# Patient Record
Sex: Female | Born: 1940 | Race: White | Hispanic: No | Marital: Married | Smoking: Never smoker
Health system: Southern US, Community
[De-identification: ages and names within clinical notes are randomized; demographics above are authoritative.]

## PROBLEM LIST (undated history)

## (undated) DIAGNOSIS — I4891 Unspecified atrial fibrillation: Secondary | ICD-10-CM

## (undated) DIAGNOSIS — Z923 Personal history of irradiation: Secondary | ICD-10-CM

## (undated) DIAGNOSIS — M1990 Unspecified osteoarthritis, unspecified site: Secondary | ICD-10-CM

## (undated) DIAGNOSIS — I341 Nonrheumatic mitral (valve) prolapse: Secondary | ICD-10-CM

## (undated) DIAGNOSIS — K5730 Diverticulosis of large intestine without perforation or abscess without bleeding: Secondary | ICD-10-CM

## (undated) DIAGNOSIS — C541 Malignant neoplasm of endometrium: Secondary | ICD-10-CM

## (undated) DIAGNOSIS — H269 Unspecified cataract: Secondary | ICD-10-CM

## (undated) DIAGNOSIS — I340 Nonrheumatic mitral (valve) insufficiency: Secondary | ICD-10-CM

## (undated) DIAGNOSIS — D649 Anemia, unspecified: Secondary | ICD-10-CM

## (undated) DIAGNOSIS — I071 Rheumatic tricuspid insufficiency: Secondary | ICD-10-CM

## (undated) DIAGNOSIS — I10 Essential (primary) hypertension: Secondary | ICD-10-CM

## (undated) DIAGNOSIS — K219 Gastro-esophageal reflux disease without esophagitis: Secondary | ICD-10-CM

## (undated) HISTORY — DX: Essential (primary) hypertension: I10

## (undated) HISTORY — PX: OTHER SURGICAL HISTORY: SHX169

## (undated) HISTORY — DX: Malignant neoplasm of endometrium: C54.1

## (undated) HISTORY — PX: TEAR DUCT PROBING: SHX793

## (undated) HISTORY — DX: Unspecified osteoarthritis, unspecified site: M19.90

## (undated) HISTORY — DX: Unspecified cataract: H26.9

## (undated) HISTORY — PX: TOOTH EXTRACTION: SUR596

## (undated) HISTORY — DX: Anemia, unspecified: D64.9

## (undated) HISTORY — DX: Personal history of irradiation: Z92.3

## (undated) HISTORY — DX: Nonrheumatic mitral (valve) insufficiency: I34.0

## (undated) HISTORY — PX: ORTHOPEDIC SURGERY: SHX850

## (undated) HISTORY — PX: APPENDECTOMY: SHX54

## (undated) HISTORY — PX: LAPAROSCOPIC HYSTERECTOMY: SHX1926

---

## 1965-11-01 HISTORY — PX: ECTOPIC PREGNANCY SURGERY: SHX613

## 1998-04-11 ENCOUNTER — Other Ambulatory Visit: Admission: RE | Admit: 1998-04-11 | Discharge: 1998-04-11 | Payer: Self-pay | Admitting: Obstetrics & Gynecology

## 1998-11-04 ENCOUNTER — Other Ambulatory Visit: Admission: RE | Admit: 1998-11-04 | Discharge: 1998-11-04 | Payer: Self-pay | Admitting: Obstetrics & Gynecology

## 1999-05-12 ENCOUNTER — Other Ambulatory Visit: Admission: RE | Admit: 1999-05-12 | Discharge: 1999-05-12 | Payer: Self-pay | Admitting: Obstetrics & Gynecology

## 2000-06-20 ENCOUNTER — Other Ambulatory Visit: Admission: RE | Admit: 2000-06-20 | Discharge: 2000-06-20 | Payer: Self-pay | Admitting: Obstetrics & Gynecology

## 2001-07-12 ENCOUNTER — Other Ambulatory Visit: Admission: RE | Admit: 2001-07-12 | Discharge: 2001-07-12 | Payer: Self-pay | Admitting: Obstetrics & Gynecology

## 2002-08-22 ENCOUNTER — Other Ambulatory Visit: Admission: RE | Admit: 2002-08-22 | Discharge: 2002-08-22 | Payer: Self-pay | Admitting: Obstetrics & Gynecology

## 2003-08-30 ENCOUNTER — Other Ambulatory Visit: Admission: RE | Admit: 2003-08-30 | Discharge: 2003-08-30 | Payer: Self-pay | Admitting: Obstetrics & Gynecology

## 2004-06-30 ENCOUNTER — Encounter: Payer: Self-pay | Admitting: Internal Medicine

## 2004-09-22 ENCOUNTER — Other Ambulatory Visit: Admission: RE | Admit: 2004-09-22 | Discharge: 2004-09-22 | Payer: Self-pay | Admitting: Obstetrics & Gynecology

## 2005-10-19 ENCOUNTER — Other Ambulatory Visit: Admission: RE | Admit: 2005-10-19 | Discharge: 2005-10-19 | Payer: Self-pay | Admitting: Obstetrics & Gynecology

## 2007-01-27 ENCOUNTER — Ambulatory Visit (HOSPITAL_COMMUNITY): Admission: RE | Admit: 2007-01-27 | Discharge: 2007-01-27 | Payer: Self-pay | Admitting: Obstetrics & Gynecology

## 2007-01-27 ENCOUNTER — Encounter (INDEPENDENT_AMBULATORY_CARE_PROVIDER_SITE_OTHER): Payer: Self-pay | Admitting: *Deleted

## 2008-01-30 ENCOUNTER — Ambulatory Visit: Admission: RE | Admit: 2008-01-30 | Discharge: 2008-01-30 | Payer: Self-pay | Admitting: Gynecologic Oncology

## 2008-02-27 ENCOUNTER — Encounter: Payer: Self-pay | Admitting: Gynecologic Oncology

## 2008-02-27 ENCOUNTER — Ambulatory Visit (HOSPITAL_COMMUNITY): Admission: RE | Admit: 2008-02-27 | Discharge: 2008-02-28 | Payer: Self-pay | Admitting: Family Medicine

## 2008-03-05 ENCOUNTER — Ambulatory Visit: Admission: RE | Admit: 2008-03-05 | Discharge: 2008-03-05 | Payer: Self-pay | Admitting: Gynecologic Oncology

## 2008-04-02 ENCOUNTER — Ambulatory Visit: Admission: RE | Admit: 2008-04-02 | Discharge: 2008-07-01 | Payer: Self-pay | Admitting: Radiation Oncology

## 2008-04-16 ENCOUNTER — Ambulatory Visit: Admission: RE | Admit: 2008-04-16 | Discharge: 2008-04-16 | Payer: Self-pay | Admitting: Gynecologic Oncology

## 2008-04-23 LAB — CBC WITH DIFFERENTIAL/PLATELET
BASO%: 0.8 % (ref 0.0–2.0)
Basophils Absolute: 0.1 10*3/uL (ref 0.0–0.1)
EOS%: 2.5 % (ref 0.0–7.0)
Eosinophils Absolute: 0.2 10*3/uL (ref 0.0–0.5)
HCT: 40 % (ref 34.8–46.6)
HGB: 13.9 g/dL (ref 11.6–15.9)
LYMPH%: 28 % (ref 14.0–48.0)
MCH: 31 pg (ref 26.0–34.0)
MCHC: 34.8 g/dL (ref 32.0–36.0)
MCV: 89.2 fL (ref 81.0–101.0)
MONO#: 0.9 10*3/uL (ref 0.1–0.9)
MONO%: 13.5 % — ABNORMAL HIGH (ref 0.0–13.0)
NEUT#: 3.8 10*3/uL (ref 1.5–6.5)
NEUT%: 55.2 % (ref 39.6–76.8)
Platelets: 188 10*3/uL (ref 145–400)
RBC: 4.49 10*6/uL (ref 3.70–5.32)
RDW: 12.5 % (ref 11.3–14.5)
WBC: 6.8 10*3/uL (ref 3.9–10.0)
lymph#: 1.9 10*3/uL (ref 0.9–3.3)

## 2008-04-30 LAB — CBC WITH DIFFERENTIAL/PLATELET
BASO%: 0.9 % (ref 0.0–2.0)
Basophils Absolute: 0 10*3/uL (ref 0.0–0.1)
EOS%: 2.4 % (ref 0.0–7.0)
Eosinophils Absolute: 0.1 10*3/uL (ref 0.0–0.5)
HCT: 38.9 % (ref 34.8–46.6)
HGB: 13.4 g/dL (ref 11.6–15.9)
LYMPH%: 23.1 % (ref 14.0–48.0)
MCH: 30.9 pg (ref 26.0–34.0)
MCHC: 34.5 g/dL (ref 32.0–36.0)
MCV: 89.5 fL (ref 81.0–101.0)
MONO#: 0.6 10*3/uL (ref 0.1–0.9)
MONO%: 14.2 % — ABNORMAL HIGH (ref 0.0–13.0)
NEUT#: 2.7 10*3/uL (ref 1.5–6.5)
NEUT%: 59.5 % (ref 39.6–76.8)
Platelets: 132 10*3/uL — ABNORMAL LOW (ref 145–400)
RBC: 4.35 10*6/uL (ref 3.70–5.32)
RDW: 12.4 % (ref 11.3–14.5)
WBC: 4.5 10*3/uL (ref 3.9–10.0)
lymph#: 1 10*3/uL (ref 0.9–3.3)

## 2008-05-07 LAB — CBC WITH DIFFERENTIAL/PLATELET
BASO%: 1.4 % (ref 0.0–2.0)
Basophils Absolute: 0.1 10*3/uL (ref 0.0–0.1)
EOS%: 4.2 % (ref 0.0–7.0)
Eosinophils Absolute: 0.2 10*3/uL (ref 0.0–0.5)
HCT: 36.6 % (ref 34.8–46.6)
HGB: 12.7 g/dL (ref 11.6–15.9)
LYMPH%: 19.7 % (ref 14.0–48.0)
MCH: 30.9 pg (ref 26.0–34.0)
MCHC: 34.8 g/dL (ref 32.0–36.0)
MCV: 88.9 fL (ref 81.0–101.0)
MONO#: 0.6 10*3/uL (ref 0.1–0.9)
MONO%: 14.8 % — ABNORMAL HIGH (ref 0.0–13.0)
NEUT#: 2.4 10*3/uL (ref 1.5–6.5)
NEUT%: 59.9 % (ref 39.6–76.8)
Platelets: 128 10*3/uL — ABNORMAL LOW (ref 145–400)
RBC: 4.12 10*6/uL (ref 3.70–5.32)
RDW: 12.5 % (ref 11.3–14.5)
WBC: 4 10*3/uL (ref 3.9–10.0)
lymph#: 0.8 10*3/uL — ABNORMAL LOW (ref 0.9–3.3)

## 2008-05-28 LAB — CBC WITH DIFFERENTIAL/PLATELET
BASO%: 0.6 % (ref 0.0–2.0)
Basophils Absolute: 0 10*3/uL (ref 0.0–0.1)
EOS%: 5.6 % (ref 0.0–7.0)
Eosinophils Absolute: 0.3 10*3/uL (ref 0.0–0.5)
HCT: 36.6 % (ref 34.8–46.6)
HGB: 12.8 g/dL (ref 11.6–15.9)
LYMPH%: 8.7 % — ABNORMAL LOW (ref 14.0–48.0)
MCH: 31.2 pg (ref 26.0–34.0)
MCHC: 35 g/dL (ref 32.0–36.0)
MCV: 89.3 fL (ref 81.0–101.0)
MONO#: 0.8 10*3/uL (ref 0.1–0.9)
MONO%: 15.5 % — ABNORMAL HIGH (ref 0.0–13.0)
NEUT#: 3.5 10*3/uL (ref 1.5–6.5)
NEUT%: 69.6 % (ref 39.6–76.8)
Platelets: 164 10*3/uL (ref 145–400)
RBC: 4.1 10*6/uL (ref 3.70–5.32)
RDW: 13 % (ref 11.3–14.5)
WBC: 5 10*3/uL (ref 3.9–10.0)
lymph#: 0.4 10*3/uL — ABNORMAL LOW (ref 0.9–3.3)

## 2008-08-01 ENCOUNTER — Ambulatory Visit: Admission: RE | Admit: 2008-08-01 | Discharge: 2008-08-01 | Payer: Self-pay | Admitting: Radiation Oncology

## 2008-09-24 ENCOUNTER — Other Ambulatory Visit: Admission: RE | Admit: 2008-09-24 | Discharge: 2008-09-24 | Payer: Self-pay | Admitting: Gynecologic Oncology

## 2008-09-24 ENCOUNTER — Ambulatory Visit: Admission: RE | Admit: 2008-09-24 | Discharge: 2008-09-24 | Payer: Self-pay | Admitting: Gynecologic Oncology

## 2008-09-25 ENCOUNTER — Encounter: Payer: Self-pay | Admitting: Gynecologic Oncology

## 2008-12-23 ENCOUNTER — Other Ambulatory Visit: Admission: RE | Admit: 2008-12-23 | Discharge: 2008-12-23 | Payer: Self-pay | Admitting: Radiation Oncology

## 2008-12-23 ENCOUNTER — Ambulatory Visit: Admission: RE | Admit: 2008-12-23 | Discharge: 2008-12-23 | Payer: Self-pay | Admitting: Radiation Oncology

## 2009-01-24 DIAGNOSIS — K5730 Diverticulosis of large intestine without perforation or abscess without bleeding: Secondary | ICD-10-CM | POA: Insufficient documentation

## 2009-01-24 DIAGNOSIS — K625 Hemorrhage of anus and rectum: Secondary | ICD-10-CM | POA: Insufficient documentation

## 2009-01-24 DIAGNOSIS — R197 Diarrhea, unspecified: Secondary | ICD-10-CM | POA: Insufficient documentation

## 2009-01-24 DIAGNOSIS — Z8719 Personal history of other diseases of the digestive system: Secondary | ICD-10-CM | POA: Insufficient documentation

## 2009-01-24 DIAGNOSIS — I10 Essential (primary) hypertension: Secondary | ICD-10-CM | POA: Insufficient documentation

## 2009-01-24 DIAGNOSIS — C549 Malignant neoplasm of corpus uteri, unspecified: Secondary | ICD-10-CM | POA: Insufficient documentation

## 2009-01-24 HISTORY — DX: Essential (primary) hypertension: I10

## 2009-01-28 ENCOUNTER — Ambulatory Visit: Payer: Self-pay | Admitting: Internal Medicine

## 2009-02-05 ENCOUNTER — Ambulatory Visit (HOSPITAL_COMMUNITY): Admission: RE | Admit: 2009-02-05 | Discharge: 2009-02-05 | Payer: Self-pay | Admitting: Internal Medicine

## 2009-02-05 ENCOUNTER — Ambulatory Visit: Payer: Self-pay | Admitting: Internal Medicine

## 2009-02-05 ENCOUNTER — Encounter: Payer: Self-pay | Admitting: Internal Medicine

## 2009-02-06 ENCOUNTER — Encounter: Payer: Self-pay | Admitting: Internal Medicine

## 2009-03-19 ENCOUNTER — Other Ambulatory Visit: Admission: RE | Admit: 2009-03-19 | Discharge: 2009-03-19 | Payer: Self-pay | Admitting: Gynecologic Oncology

## 2009-03-19 ENCOUNTER — Encounter: Payer: Self-pay | Admitting: Gynecologic Oncology

## 2009-03-19 ENCOUNTER — Ambulatory Visit: Admission: RE | Admit: 2009-03-19 | Discharge: 2009-03-19 | Payer: Self-pay | Admitting: Gynecologic Oncology

## 2009-04-11 ENCOUNTER — Encounter: Admission: RE | Admit: 2009-04-11 | Discharge: 2009-04-11 | Payer: Self-pay | Admitting: Internal Medicine

## 2009-04-11 ENCOUNTER — Encounter (INDEPENDENT_AMBULATORY_CARE_PROVIDER_SITE_OTHER): Payer: Self-pay | Admitting: *Deleted

## 2009-05-13 ENCOUNTER — Ambulatory Visit: Payer: Self-pay | Admitting: Internal Medicine

## 2009-06-23 ENCOUNTER — Ambulatory Visit: Admission: RE | Admit: 2009-06-23 | Discharge: 2009-06-23 | Payer: Self-pay | Admitting: Radiation Oncology

## 2009-06-23 ENCOUNTER — Encounter: Payer: Self-pay | Admitting: Radiation Oncology

## 2009-06-23 ENCOUNTER — Encounter: Payer: Self-pay | Admitting: Internal Medicine

## 2009-06-23 ENCOUNTER — Other Ambulatory Visit: Admission: RE | Admit: 2009-06-23 | Discharge: 2009-06-23 | Payer: Self-pay | Admitting: Radiation Oncology

## 2009-09-17 ENCOUNTER — Other Ambulatory Visit: Admission: RE | Admit: 2009-09-17 | Discharge: 2009-09-17 | Payer: Self-pay | Admitting: Gynecologic Oncology

## 2009-09-17 ENCOUNTER — Ambulatory Visit: Admission: RE | Admit: 2009-09-17 | Discharge: 2009-09-17 | Payer: Self-pay | Admitting: Gynecologic Oncology

## 2009-12-22 ENCOUNTER — Ambulatory Visit: Admission: RE | Admit: 2009-12-22 | Discharge: 2009-12-22 | Payer: Self-pay | Admitting: Radiation Oncology

## 2009-12-22 ENCOUNTER — Other Ambulatory Visit: Admission: RE | Admit: 2009-12-22 | Discharge: 2009-12-22 | Payer: Self-pay | Admitting: Gynecologic Oncology

## 2010-03-11 ENCOUNTER — Other Ambulatory Visit: Admission: RE | Admit: 2010-03-11 | Discharge: 2010-03-11 | Payer: Self-pay | Admitting: Gynecologic Oncology

## 2010-03-11 ENCOUNTER — Ambulatory Visit: Admission: RE | Admit: 2010-03-11 | Discharge: 2010-03-11 | Payer: Self-pay | Admitting: Gynecologic Oncology

## 2010-09-17 ENCOUNTER — Other Ambulatory Visit: Admission: RE | Admit: 2010-09-17 | Discharge: 2010-09-17 | Payer: Self-pay | Admitting: Radiation Oncology

## 2010-09-17 ENCOUNTER — Ambulatory Visit: Admission: RE | Admit: 2010-09-17 | Discharge: 2010-09-17 | Payer: Self-pay | Admitting: Radiation Oncology

## 2010-12-03 NOTE — Letter (Addendum)
Summary: [facility]  [facility]   Imported By: Sherian Rein 07/17/2009 15:15:06  _____________________________________________________________________  External Attachments:     1. Type:   Image          Comment:   External Document    2. Type:   Image          Comment:   External Document

## 2010-12-03 NOTE — Procedures (Signed)
Summary: Instructions for Colonoscopy/[facility] (out pt)  Instructions for Colonoscopy/[facility] (out pt)   Imported By: Sherian Rein 01/29/2009 14:18:51  _____________________________________________________________________  External Attachment:    Type:   Image     Comment:   External Document

## 2010-12-03 NOTE — Procedures (Signed)
Summary: Colonoscopy/BX.   Colonoscopy  Procedure date:  02/05/2009  Findings:      Location:  [facility].    COLONOSCOPY PROCEDURE REPORT  PATIENT:  [name], [name]  MR#:  [id number] BIRTHDATE:   01-11-1941, 67 yrs. old   GENDER:   female  ENDOSCOPIST:   Hedwig Morton. Juanda Chance, MD Referred by: Varney Baas, M.D.  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  Digital rectal exam was performed and revealed no rectal masses.   The EC-[id number] ([id number]), VF-[id number] ([id number]) and [id number] ([id number]) endoscope was introduced through the anus and advanced to the mid transverse colon, limited by a stricture.  narrow sigmoid colon lumen starting at 20 cm, large edematous folds, lumen slightly narrow, few scattered diverticuli, standard colonoscope unable to pass  pediatric upper endoscope used to traverse the the sigmoid stricture,   The quality of the prep was good, using MiraLax.  The instrument was then slowly withdrawn as the colon was fully examined. 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 narrow lumen 20-30 cm, due to large folds, tortuous rigid lumen, musoca slightly edematous, but no bleeding, pediatric upper endoscope used to traverse the sigmoid colon With standard forceps, biopsy was obtained and sent to pathology (see image001, image002, and image003).   Retroflexed views in the rectum revealed no abnormalities.    The scope was then withdrawn from the patient and the procedure completed.  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  would do Barium enema or virtual colonoscopy next time   _______________________________ Hedwig Morton. Juanda Chance, MD  CC: Juline Patch MD, Dr Duard Brady   This report was created from the original endoscopy report, which was reviewed and signed by the above listed endoscopist.     Appended Document: Colonoscopy/BX. Recall is in IDX for 01/2014.

## 2010-12-03 NOTE — Procedures (Signed)
Summary: COLON   Colonoscopy  Procedure date:  06/30/2004  Findings:      Location:  [facility].    Procedures Next Due Date:    Colonoscopy: 07/2014  Patient Name: [name], [name]. MRN:  Procedure Procedures: Colonoscopy CPT: [phone].  Personnel: Endoscopist: Dora L. Juanda Chance, MD.  Exam Location: Exam performed in Outpatient Clinic. Outpatient  Patient Consent: Procedure, Alternatives, Risks and Benefits discussed, consent obtained, from patient. Consent was obtained by the RN.  Indications  Surveillance of: 1994.  Average Risk Screening Routine.  History  Current Medications: Patient is not currently taking Coumadin.  Pre-Exam Physical: Performed Jun 30, 2004. Entire physical exam was normal.  Exam Exam: Extent of exam reached: Cecum, extent intended: Cecum.  The cecum was identified by appendiceal orifice and IC valve. Colon retroflexion performed. Images taken. ASA Classification: I. Tolerance: good.  Monitoring: Pulse and BP monitoring, Oximetry used. Supplemental O2 given.  Colon Prep Used Miralax for colon prep. Prep results: good.  Sedation Meds: Patient assessed and found to be appropriate for moderate (conscious) sedation. Fentanyl 125 mcg. given IV. Versed 13 given IV.  Findings NORMAL EXAM: Cecum.  - DIVERTICULOSIS: Sigmoid Colon. ICD9: Diverticulosis: 562.10. Comments: mild, shallow diverticuli.   Assessment Normal examination.  Diagnoses: 562.10: Diverticulosis.   Comments: no polyps Events  Unplanned Interventions: No intervention was required.  Unplanned Events: There were no complications. Plans Patient Education: Patient given standard instructions for: Yearly hemoccult testing recommended. Patient instructed to get routine colonoscopy every 10 years.  Disposition: After procedure patient sent to recovery. After recovery patient sent home.   This report was created from the original endoscopy report, which was  reviewed and signed by the above listed endoscopist.

## 2010-12-03 NOTE — Letter (Signed)
Summary: Patient Notice- Colon Biospy Results  [facility]  [address] [place], [place] [address]   Phone: [phone]  Fax: [phone]        February 06, 2009 MRN: [id number]    SARAJANE FAMBROUGH [address] [place], [place]  [address]    Dear Ms. Yetta Barre,  I am pleased to inform you that the biopsies taken during your recent colonoscopy did not show any evidence of cancer upon pathologic examination.  Additional information/recommendations:  __No further action is needed at this time.  Please follow-up with      your primary care physician for your other healthcare needs.  __Please call [phone] to schedule a return visit to review      your condition.  _x_Continue with the treatment plan as outlined on the day of your      exam.  _x_You should have a repeat colonoscopy examination for this problem           in _5 years.  Please call us if you are having persistent problems or have questions about your condition that have not been fully answered at this time.  Sincerely,  Hart Carwin   This letter has been electronically signed by your physician.  Appended Document: Patient Notice- Colon Biospy Results Letter mailed to patient. Recall is in IDX for 01/2014.

## 2010-12-03 NOTE — Assessment & Plan Note (Signed)
Summary: RECTAL BLEED/YF   History of Present Illness Visit Type: Initial Consult Primary GI MD: Lina Sar MD Primary Provider: Juline Patch MD Requesting Provider: Varney Baas MD Chief Complaint: rectal bleeding, diarrhea History of Present Illness:   This is a 70 year old white female with intermittent painless rectal bleeding. She is status post pelvic radiation for uterine cancer in the summer of 2009. She also had a TAH/BSO in April of 2009. She is having diarrhea, urgency and some leakage of stool. Her last colonoscopy in August 2005 showed mild diverticulosis of the left colon. Patient has no history of colon cancer, polyps or any family history of colon neoplasm. Her first colonoscopy was in 1994 and was normal. She has used Imodium for the diarrhea but it has had a constipating effect.   GI Review of Systems      Denies abdominal pain, acid reflux, belching, bloating, chest pain, dysphagia with liquids, dysphagia with solids, heartburn, loss of appetite, nausea, vomiting, vomiting blood, weight loss, and  weight gain.      Reports diarrhea.     Denies anal fissure, black tarry stools, change in bowel habit, constipation, diverticulosis, fecal incontinence, heme positive stool, hemorrhoids, irritable bowel syndrome, jaundice, light color stool, liver problems, and  rectal pain.    Current Medications (verified): 1)  Toprol Xl 25 Mg Xr24h-Tab (Metoprolol Succinate) .... Take 1 Tablet By Mouth Two Times A Day 2)  Celebrex 200 Mg Caps (Celecoxib) .... As Needed 3)  Plaquenil 200 Mg Tabs (Hydroxychloroquine Sulfate) .Marland Kitchen.. 1 By Mouth Once Daily in The Spring  Allergies (verified): No Known Drug Allergies  Past History:  Past Medical History:    Reviewed history from 01/24/2009 and no changes required:    Current Problems:     HYPERTENSION (ICD-401.9)    Hx of CANCER, ENDOMETRIUM (ICD-182.0)    DIARRHEA (ICD-787.91)    HEMORRHOIDS, HX OF (ICD-V12.79)    DIVERTICULOSIS OF  COLON (ICD-562.10)    RECTAL BLEEDING (ICD-569.3)       Past Surgical History:    Reviewed history from 01/24/2009 and no changes required:    Total Hysterectomy  Family History:    Reviewed history from 01/24/2009 and no changes required:       Family History of Breast Cancer:       No FH of Colon Cancer:  Social History:    Alcohol Use - yes-1 drink per week    Illicit Drug Use - no    Patient has never smoked.     Patient does not get regular exercise.     Smoking Status:  never    Does Patient Exercise:  no  Review of Systems       The patient complains of night sweats, swelling of feet/legs, and urine leakage.    Vital Signs:  Patient profile:   70 year old female Height:      64 inches Weight:      201.13 pounds BMI:     34.65 Pulse rate:   72 / minute Pulse rhythm:   regular BP sitting:   118 / 60  (right arm)  Vitals Entered By: Chales Abrahams CMA (January 28, 2009 3:07 PM)  Physical Exam  General:  alert, oriented and in no distress. Neck:  Supple; no masses or thyromegaly. Lungs:  Clear throughout to auscultation. Heart:  Regular rate and rhythm; no murmurs, rubs or bruits. Abdomen:  soft mildly obese abdomen with normal active bowel sounds. No distention. Liver edge at costal margin.  Decreased pubic hair due to radiation of the sacral area which also reveals radiation skin changes. Rectal:  normal perianal area. Normal rectal tone. No external hemorrhoids. Stool is soft and hemoccult-negative. Extremities:  No clubbing, cyanosis, edema or deformities noted. Neurologic:  Alert and  oriented x4;  grossly normal neurologically.   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 We need to rule out new onset inflammatory bowel disease, collagenous colitis or irritable bowel syndrome. A colonoscopy was done 5 years ago prior to the radiation. Because of her increased risk  of colon cancer, we will proceed with a colonoscopy and appropriate biopsies. We will treat her empirically with ProctoFoam and Bentyl 20 mg twice a day.  Problem # 2:  Hx of CANCER, ENDOMETRIUM (ICD-182.0) status post pelvic radiation followed by Dr. Lloyd Huger and by Dr.Gehrig.  Other Orders: ZCOL (ZCOL)  Patient Instructions: 1)  ProctoFoam apply q.h.s. 2)  Bentyl 20 mg p.o. b.i.d. 3)  Schedule colonoscopy 4)  Copy sent to : Dr Duard Brady, Dr R.Jennette Kettle Prescriptions: DULCOLAX 5 MG  TBEC (BISACODYL) Day before procedure take 2 at 3pm and 2 at 8pm.  #4 x 0   Entered by:   Hortense Ramal CMA   Authorized by:   Hart Carwin   Signed by:   Hortense Ramal CMA on 01/28/2009   Method used:   Electronically to        [facility] (retail)       [address]       [place], [place]  [address]       Ph: [phone]       Fax: [phone]   RxID:   [id number] REGLAN 10 MG  TABS (METOCLOPRAMIDE HCL) As per prep instructions.  #2 x 0   Entered by:   Hortense Ramal CMA   Authorized by:   Hart Carwin   Signed by:   Hortense Ramal CMA on 01/28/2009   Method used:   Electronically to        [facility] (retail)       [address]       [place], [place]  [address]       Ph: [phone]       Fax: [phone]   RxID:   [id number] MIRALAX   POWD (POLYETHYLENE GLYCOL 3350) As per prep  instructions.  #255gm x 0   Entered by:   Hortense Ramal CMA   Authorized by:   Hart Carwin   Signed by:   Hortense Ramal CMA on 01/28/2009   Method used:   Electronically to        [facility] (retail)       [address]       [place], [place]  [address]       Ph: [phone]       Fax: [phone]   RxID:   [id number] BENTYL 20 MG TABS (DICYCLOMINE HCL) Take 1 tablet by mouth two times a day.  #60 x 2   Entered by:   Hortense Ramal CMA   Authorized by:   Hart Carwin    Signed by:   Hortense Ramal CMA on 01/28/2009   Method used:   Electronically to        [facility] (retail)       [address]       [place], [place]  [address]       Ph: [phone]       Fax: [phone]   RxID:   [id number] PROCTOFOAM HC 1-1 % FOAM (HYDROCORTISONE ACE-PRAMOXINE) Apply at bedtime  #1 kit x 1   Entered by:   Hortense Ramal CMA   Authorized by:   Hart Carwin   Signed by:   Hortense Ramal CMA on 01/28/2009   Method used:   Electronically to        [facility] (retail)       [address]       [place], [place]  [address]       Ph: [phone]       Fax: [phone]   RxID:   [id number]

## 2010-12-03 NOTE — Assessment & Plan Note (Signed)
Summary: F/U AFTER PROCEDURE 4-7.Marland KitchenMarland KitchenEM   History of Present Illness Visit Type: follow up  Primary GI MD: Lina Sar MD Primary Verlaine Embry: Juline Patch MD Requesting Ozelle Brubacher: n/a Chief Complaint: F/u after colonoscopy done in April. Pt states that she has some diarrhea but getting better History of Present Illness:   This is a 70 year old white female who we saw recently for intermittent painless rectal bleeding. She is status post pelvic radiation for uterine cancer in the summer of 2009. She also had a TAH/BSO in April of 2009. At the time we saw her, she was having diarrhea, urgency and some leakage of stool. Her last colonoscopy in August 2005 showed mild diverticulosis of the left colon. Patient has no history of colon cancer, polyps or any family history of colon neoplasm. Her first colonoscopy was in 1994 and was normal. She was using Imodium for the diarrhea but it had a constipating effect. Subsequent to patient's last office visit, we performed a colonoscopy which was incomplete to transverse colon due to  stenosis in the sigmoid colon as wellas adhesions and radiation. Rectal biosies showed benign colorectal mucosa with no significant inflammation, granulomata or adenomatous  epithelium identified. Patient also had an abdominal ultrasound for her abdominal pain. That came back negative. Patient comes today for follow up. She does still complain of some diarrhea although she tells me it has gotten somewhat better taking Bentyl 20 mg by mouth two times a day.   GI Review of Systems      Denies abdominal pain, acid reflux, belching, bloating, chest pain, dysphagia with liquids, dysphagia with solids, heartburn, loss of appetite, nausea, vomiting, vomiting blood, weight loss, and  weight gain.      Reports diarrhea.     Denies anal fissure, black tarry stools, change in bowel habit, constipation, diverticulosis, fecal incontinence, heme positive stool, hemorrhoids, irritable bowel syndrome,  jaundice, light color stool, liver problems, rectal bleeding, and  rectal pain.    Current Medications (verified): 1)  Toprol Xl 25 Mg Xr24h-Tab (Metoprolol Succinate) .... Take 1 Tablet By Mouth Two Times A Day 2)  Celebrex 200 Mg Caps (Celecoxib) .... As Needed 3)  Plaquenil 200 Mg Tabs (Hydroxychloroquine Sulfate) .Marland Kitchen.. 1 By Mouth Once Daily in The Spring 4)  Proctofoam Hc 1-1 % Foam (Hydrocortisone Ace-Pramoxine) .... Apply At Bedtime 5)  Bentyl 20 Mg Tabs (Dicyclomine Hcl) .... Take 1 Tablet By Mouth Two Times A Day.  Allergies (verified): No Known Drug Allergies  Past History:  Past Medical History: Reviewed history from 01/24/2009 and no changes required. Current Problems:  HYPERTENSION (ICD-401.9) Hx of CANCER, ENDOMETRIUM (ICD-182.0) DIARRHEA (ICD-787.91) HEMORRHOIDS, HX OF (ICD-V12.79) DIVERTICULOSIS OF COLON (ICD-562.10) RECTAL BLEEDING (ICD-569.3)    Past Surgical History: Reviewed history from 01/24/2009 and no changes required. Total Hysterectomy  Family History: Family History of Breast Cancer:Mother and sister  No FH of Colon Cancer:  Social History: Reviewed history from 01/28/2009 and no changes required. Alcohol Use - yes-1 drink per week Illicit Drug Use - no Patient has never smoked.  Patient does not get regular exercise.   Review of Systems  The patient denies allergy/sinus, anemia, anxiety-new, arthritis/joint pain, back pain, blood in urine, breast changes/lumps, change in vision, confusion, cough, coughing up blood, depression-new, fainting, fatigue, fever, headaches-new, hearing problems, heart murmur, heart rhythm changes, itching, menstrual pain, muscle pains/cramps, night sweats, nosebleeds, pregnancy symptoms, shortness of breath, skin rash, sleeping problems, sore throat, swelling of feet/legs, swollen lymph glands, thirst - excessive , urination -  excessive , urination changes/pain, urine leakage, vision changes, and voice change.          Pertinent positive and negative review of systems were noted in the above HPI. All other ROS was otherwise negative.   Vital Signs:  Patient profile:   70 year old female Height:      64 inches Weight:      198 pounds BMI:     34.11 BSA:     1.95 Pulse rate:   74 / minute Pulse rhythm:   regular BP sitting:   116 / 72  (right arm) Cuff size:   regular  Vitals Entered By: Ok Anis CMA (May 13, 2009 8:31 AM)  Physical Exam  General:  Well developed, well nourished, no acute distress. Eyes:  PERRLA, no icterus. Neck:  Supple; no masses or thyromegaly. Lungs:  Clear throughout to auscultation. Heart:  Regular rate and rhythm; no murmurs, rubs,  or bruits. Abdomen:  soft nontender with normal active bowel sounds. No tenderness. Radiation tattoos in sacroiliac areas.   Impression & Recommendations:  Problem # 1:  DIARRHEA (ICD-787.91) intermittent urgent diarrhea with incontinence occurring twice a week. It has improved on antispasmodics. Her symptoms as likely due to pelvic adhesions and radiation. We will switch her from Bentyl to Robinul Forte 2 mg twice a day. It has been one year since her radiation and I anticipate that her symptoms may improve with time. I encouraged her to take Imodium p.r.n. especially on the days that she has to leave the house. If the symptoms continue, I would consider doing a barium enema for further evaluation.  Problem # 2:  Hx of CANCER, ENDOMETRIUM (ICD-182.0) status post 28 external  pelvic radiations and 3 internal radiations completed one year ago. She has been tumor free.  Problem # 3:  HEMORRHOIDS, HX OF (ICD-V12.79) Patient has an occasional bleeding from the hemorrhoids correlating with the diarrhea. She has topical steroids to use p.r.n.  Patient Instructions: 1)  Robinul Forte 2 mg one p.o. b.i.d. 2)  High-fiber diet 3)  Imodium p.r.n.  4)  Office visit 6 months 5)  Copy sent to : Dr R.Pang Prescriptions: ROBINUL-FORTE 2 MG TABS  (GLYCOPYRROLATE) Take 1 tablet by mouth two times a day  #60 x 11   Entered by:   Hortense Ramal CMA (AAMA)   Authorized by:   Hart Carwin MD   Signed by:   Hortense Ramal CMA (AAMA) on 05/13/2009   Method used:   Electronically to        [facility] (retail)       [address]       [place], [place]  [address]       Ph: [phone]       Fax: [phone]   RxID:   [id number]

## 2011-03-16 NOTE — H&P (Signed)
[name], [name]                  ACCOUNT NO.:  [id number]   MEDICAL RECORD NO.:  [id number]          PATIENT TYPE:  AMB   LOCATION:  DAY                          FACILITY:  [facility]   PHYSICIAN:  Freddy Finner, M.D.   DATE OF BIRTH:  07-Feb-1941   DATE OF ADMISSION:  02/27/2008  DATE OF DISCHARGE:                              HISTORY & PHYSICAL   ADMITTING DIAGNOSIS:  1. 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  2. Uterine enlargement with uterine leiomyomas.  3. Sonohysterogram in February 2008 showing a thickened endometrial      cavity and a 7 mm polypoid mass in the endometrial cavity.  Overall      endometrial lining measured 3 mm on December 21, 2007.   The patient is a 70 year old white, married female, gravida 2, para 1  with 1 previous ectopic pregnancy and right partial salpingectomy for  that condition, who has been followed in my office since 1982.  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  She has been continuously followed in my office  since that time and at the age of 9 was started on combination hormone  replacement therapy.  She has continued with this over the interval  since that time with various preparations over time always including  some Progestrone for protection of the endometrium.  Her current  medication is Ortho-Prefest.  Also over the course of the last several  years she has had a series of at least 3 endometrial biopsies and has  had 3 formal D and C, hysteroscopy procedures including one in 2008 and  the most recent was in March of this year with the tissue findings noted  above.  She is noted to have a 16 mm myoma adjacent to the  endometrium  by sonohysterogram in the office.  Because of the significant atypical  findings on this particular biopsy and the potential for malignancy in  this environment, she has been seen in consultation by Dr. Delynn Flavin.  She  is admitted now for hysterectomy, bilateral salpingo-oophorectomy,  possible intraoperative node dissection.   Her current review of systems is otherwise negative.  She has no  cardiopulmonary etiology or new complaints.  Her only current medication  is for hypertension for which she takes Toprol 50 mg 1 a day.   PAST MEDICAL HISTORY:  Otherwise remarkable for no other significant  medical illnesses.  She did have a benign breast biopsy in 1993.  Her  Pap smears have been routinely normal.  Her most recent mammogram was in  March of 2009 and was considered to be completely normal or negative.   ALLERGIES:  SHE HAS NO KNOWN ALLERGIES TO MEDICATIONS.   She does not use cigarettes.  She uses alcohol only socially.  She has  never had a blood transfusion.   FAMILY HISTORY:  Remarkable only for breast cancer.   PHYSICAL EXAMINATION:  HEENT:  Grossly within normal limits.  VITAL SIGNS:  The patient is 5 feet 4-1/2-inches tall.  Weight is 214  pounds.  Blood pressure in the office 124/88.  GENERAL:  She is a mildly obese, alert, oriented, cooperative white  female in no acute distress, who is alert and oriented.  NECK:  Her thyroid gland is not palpably enlarged to palpation.  CHEST:  Clear to auscultation throughout.  HEART:  No sinus rhythm without murmurs, rubs, or gallops.  CHEST:  Clear to auscultation throughout.  BREASTS:  Normal.  No palpable masses.  No nipple discharge or skin  change.  ABDOMEN:  Soft and nontender.  No appreciable organomegaly or palpable  masses.  PELVIC:  External genitalia, vagina, and cervix are normal.  Bimanual  reveals the uterus to be slightly enlarged.  Exam was somewhat  compromised by body habitus.  There are no palpable  adnexal masses.  RECTUM:  Normal.  Rectovaginal exam confirms the above findings.  Recent  ultrasound in the office on January 15, 2008 did confirm no adnexal masses  or evidence of ovarian disease.  EXTREMITIES:  Without cyanosis, clubbing, or edema.   ASSESSMENT:  1. Uterine enlargement.  2. Recent hysteroscopy, dilatation and curettage with marked glandular      atypia.  3. Extensive necrosis suggestive of malignancy.   PLAN:  Laparoscopically-assisted vaginal hysterectomy, bilateral  salpingo-oophorectomy.  Possible intraoperative node dissection--frozen  section of the endometrial tissue and uterus.      Freddy Finner, M.D.  Electronically Signed     WRN/MEDQ  D:  02/26/2008  T:  02/26/2008  Job:  [id number]

## 2011-03-16 NOTE — Consult Note (Signed)
[name], [name]                  ACCOUNT NO.:  [id number]   MEDICAL RECORD NO.:  [id number]          PATIENT TYPE:  OUT   LOCATION:  GYN                          FACILITY:  [facility]   PHYSICIAN:  Paola A. Duard Brady, MD    DATE OF BIRTH:  Dec 20, 1940   DATE OF CONSULTATION:  03/05/2008  DATE OF DISCHARGE:                                 CONSULTATION   Ms. Gervase 70 year old who underwent a laparoscopic hysterectomy, BSO on  February 27, 2008, for complex hyperplasia with atypia, could not rule out  grade 1 endometrial carcinoma.  Frozen section revealed no myometrial  invasion.  Therefore lymphadenectomy was not performed.  Final pathology  revealed reactive mesothelial cells but no malignant cells within the  washings.  The uterus itself had a zero myometrial invasion.  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  There was diffuse  lymphovascular space involvement, including deep levels of the  myometrium.  The cervix was negative, washings were negative.  There was  a floating small area of atypical endometrial type glands within the  lumen that were detached from the underlying right fallopian tube and  consistent with a floater.  The right ovary was unremarkable.  Left  ovary showed area of endometriosis with slight hyperplastic changes  within the endometrial glands.  She comes in today to discuss her  pathology.  She is overall doing quite well and recovering from the  surgery quite well.   PHYSICAL EXAMINATION:  Weight 212 pounds, blood pressure 140/84.  A well-  nourished, alert female in no acute distress.  Exam was deferred at that  time talking to the patient and her husband today.  Her tumor has been  very unusual.  I believe the floater is most likely due either to the  ZUMI manipulation or the hysteroscopy D&C.  Fortunately, however, the  floater and we do not use that information to upstage a  patient and her  washings were negative.  Interestingly while she had minimal tumor and  there was no myometrial invasion, she had deep lymphovascular space  involvement.  I spoke to Ms. Mihalic and her husband regarding this and  the controversy regarding this.  We tend to treat these more  aggressively as the lymphovascular space involvement can portend a  worsening prognosis.  We discussed the options of either close follow-up  versus radiation therapy.  After our  discussion they would be interested in proceeding with radiation  therapy.  We will get her set up to see the radiation oncologist here at  [facility] with an appointment with me after the first week of June.  The patient will consider these issues and come up with questions I will  be happy to discuss with her.      Paola A. Duard Brady, MD  Electronically Signed     PAG/MEDQ  D:  03/05/2008  T:  03/05/2008  Job:  [id number]   cc:   Freddy Finner, M.D.  Fax: [phone]   Juline Patch,  M.D.  Fax: [phone]   Telford Nab, R.N.  [address]  [place], [place] [address]

## 2011-03-16 NOTE — Consult Note (Signed)
NAMEMOSSIE, GILDER                  ACCOUNT NO.:  [id number]   MEDICAL RECORD NO.:  [id number]          PATIENT TYPE:  OUT   LOCATION:  GYN                          FACILITY:  [facility]   PHYSICIAN:  Paola A. Duard Brady, MD    DATE OF BIRTH:  [birth year]   DATE OF CONSULTATION:  09/24/2008  DATE OF DISCHARGE:                                 CONSULTATION   HISTORY:  Ms. Petron is a very pleasant 70 year old with a stage 1A grade  1 endometrial carcinoma.  She underwent surgery consisting of a  laparoscopic hysterectomy and BSO February 27, 2008 for complex hyperplasia  with atypia.  Could not rule out a grade 1 endometrial carcinoma.  Frozen section revealed no evidence of myometrial invasion.  Therefore,  lymphadenectomy was not performed.  Final pathology, however, revealed  no myometrial invasion.  She had a minute fragment of grade 1  endometrioid adenocarcinoma arising in association with complex atypical  hyperplasia.  There was diffuse lymphovascular space involvement  including to the deep levels of the myometrium.  The cervix was negative  as were the washings.  There was a floating small area of atypical  endometrial type glands within the lumen of the right fallopian tube  consistent with a floater.  The right ovary was unremarkable as was the  left ovary.  After lengthy discussion with the patient and her family,  we felt that it would be best to proceed with consideration of radiation  therapy.  Discussion was held to consider lymphadenectomy, however, she  would still be at potential risk for pelvic recurrence.  Therefore, we  opted for consideration of postoperative pelvic radiation and vaginal  cuff brachytherapy.  She underwent a successful treatment under the care  of Dr. Roselind Messier and comes in today for followup.  She is overall doing  quite well.  She states that she had some fatigue and loose stools  during the radiation therapy.  She will still occasionally have some  diarrhea  every few days.  She does not always take Lomotil.  She has  maybe 5 episodes at worse when she does have these episodes of diarrhea.  It is worse if she eats a lot of roughage.  She denies any vaginal  bleeding.  She has had a slight cold and has had a bit of a cough, but  is otherwise doing quite well and is feeling very good.   MEDICATIONS:  1. Toprol.  2. Calcium.  3. Fish oil.  4. Multivitamin.   PHYSICAL EXAMINATION:  VITAL SIGNS:  Weight 198 pounds which is down 9  pounds since June 2009, blood pressure 132/73.  GENERAL:  Well-nourished, well-developed female in no acute distress.  NECK:  Supple.  There is no lymphadenopathy, no thyromegaly.  LUNGS:  Clear to auscultation bilaterally.  CARDIOVASCULAR:  Regular rate and rhythm.  ABDOMEN:  Shows well-healed surgical incisions.  Abdomen is soft,  nontender, nondistended.  There are no palpable masses or  hepatosplenomegaly.  Groins are negative for adenopathy.  EXTREMITIES:  There is no edema.  PELVIC:  External genitalia is  within normal limits.  The vagina is  somewhat atrophic.  The vaginal cuff is visualized.  There are no  visible lesions or discharge.  ThinPrep Pap was submitted without  difficulty.  Bimanual examination reveals no masses or nodularity.  Rectal confirms.   ASSESSMENT:  A 70 year old with a stage 1A grade 1 endometrioid  adenocarcinoma.  However, while she had no myometrial invasion of tumor,  she had extensive lymphovascular space involvement to the deep  myometrial levels.  After lengthy discussion, it was opted to undergo  pelvic radiation with vaginal cuff brachytherapy which she has  successfully completed.  We will follow up on the results of her Pap  smear from today.  She states she has an appointment to see Dr. Roselind Messier  in February 2010 and she will return to see [place] in May 2010.   She will continue her follow up with Dr. Ricki Miller.  She states that he  recently performed a white count that was  slightly low at around 4 and  he is going to be rechecking that in a week.      Paola A. Duard Brady, MD  Electronically Signed     PAG/MEDQ  D:  09/24/2008  T:  09/25/2008  Job:  [id number]   cc:   Juline Patch, M.D.  Fax: [phone]   Telford Nab, R.N.  [address]  [place], [place] [address]   Billie Lade, M.D.  Fax: [phone]

## 2011-03-16 NOTE — Consult Note (Signed)
[name], [name]                  ACCOUNT NO.:  [id number]   MEDICAL RECORD NO.:  [id number]          PATIENT TYPE:  OUT   LOCATION:  GYN                          FACILITY:  [facility]   PHYSICIAN:  Paola A. Duard Brady, MD    DATE OF BIRTH:  11-12-1940   DATE OF CONSULTATION:  01/30/2008  DATE OF DISCHARGE:                                 CONSULTATION   The patient is seen today in consultation at the request of Dr. Varney Baas.  [name] is a very pleasant 70 year old gravida 2, para 1,  aborta 1, who underwent hysteroscopy D&C on January 25, 2008.  It revealed  at least complex atypical hyperplasia in a background of extensive  necrosis suspicious for malignancy.  It is for this reason that she is  referred to [place].  The patient states that she had been menopausal since  the age of 64-50 and has essentially been on hormone replacement therapy  since that time and continues to be on it.  In January 2008 she had her  first episode of abnormal bleeding, had an ultrasound that revealed  polyps.  She underwent hysteroscopy D&C at that time with benign  findings.  She did well and had no bleeding until March 2009, which just  prior to her annual exam with Dr. Jennette [name] she began having some bleeding.  Ultrasound showed some polyps and she underwent the  hysteroscopy D&C  with the findings as above.  She has otherwise been doing quite well and  denies any symptoms.  She denies any chest pain, shortness of breath,  nausea, vomiting, fevers, chills, headaches, visual changes, abdominal  pain, change in bowel or bladder habits.   PAST MEDICAL HISTORY:  Significant for hypertension and irregular  heartbeat.   MEDICATIONS:  Premphase, Toprol, calcium, fish oil, multivitamin.   PAST SURGICAL HISTORY:  Ectopic pregnancy, tear duct surgery, right knee  arthroscopic surgery, tubal ligation.   ALLERGIES:  None.   SOCIAL HISTORY:  She denies use of tobacco or alcohol.  She works as an  Recruitment consultant.  She is married.  Her husband is an Art gallery manager  and was just laid off.  She has a 68 year old son and two adopted  grandchildren.   FAMILY HISTORY:  Her mother had breast cancer x2, initially at the age  of 48 and then at the age of 34.  She died at the age of 67.  She also  had a subsequent oral cancer and was a nonsmoker and did not use snuff.  Her sister has terminal breast cancer at the age of 67.  Her father had  leukemia.   HEALTH MAINTENANCE:  She is up to date on her mammogram.  She had a  colonoscopy in 2005 and it was recommended that she have follow-up in 10  years.   PHYSICAL EXAMINATION:  Height 5 feet 5 inches, weight 219 pounds.  Blood  pressure 136/82, pulse 75.  Well-nourished, well-developed female in no acute distress.  NECK:  Supple.  There is no lymphadenopathy, no thyromegaly.  LUNGS:  Clear to auscultation  bilaterally.  CARDIOVASCULAR:  Regular rate and rhythm.  ABDOMEN:  Soft, nontender, nondistended.  There are no palpable masses  or hepatosplenomegaly.  She has a well-healed transverse skin incision.  Groins are negative for adenopathy.  EXTREMITIES:  There is no edema.  PELVIC:  External genitalia is within normal limits.  The vagina is  atrophic.  The cervix is visualized.  It is multiparous.  There is a  physiologic discharge and no visible lesions.  Bimanual examination:  The cervix is palpably normal.  The corpus is of normal size, shape and  consistency.  There are no adnexal masses.   ASSESSMENT:  A 70 year old with postmenopausal bleeding, who on  pathology has at least complex hyperplasia with atypia.  Cannot rule out  endometrial carcinoma.  We discussed the need for surgery and she is  amenable to this.  I do believe we could proceed with laparoscopic  hysterectomy and bilateral salpingo-oophorectomy and send the uterus for  frozen section.  Should there be no evidence of malignancy and no  invasion, she can be followed expectantly.   Should there be more  invasive disease or a higher-grade lesion, then appropriate staging will  proceed.  We discussed attempting to schedule this with Dr. Jennette [name] and  will be in contact with his office to coordinate surgery.  The risks and  benefits of surgery including but not limited to bleeding, infection,  injury to surrounding organs, thromboembolic disease and need for  laparotomy were discussed with the patient.  Her questions were elicited  and answered to her satisfaction.  She was given both my card and that  of Telford Nab, R.N., and she will contact us with any questions.  She knows that we will be coordinating her surgery with Dr. Jennette [name] and  contact her when it is accomplished.  She will need to stop her fish oil  1 week before surgery.      Paola A. Duard Brady, MD  Electronically Signed     PAG/MEDQ  D:  01/30/2008  T:  01/31/2008  Job:  [id number]   cc:   Freddy Finner, M.D.  Fax: [phone]   Juline Patch, M.D.  Fax: [phone]   Telford Nab, R.N.  [address]  [place], [place] [address]

## 2011-03-16 NOTE — Op Note (Signed)
[name], [name]                  ACCOUNT NO.:  [id number]   MEDICAL RECORD NO.:  [id number]          PATIENT TYPE:  AMB   LOCATION:  DAY                          FACILITY:  [facility]   PHYSICIAN:  Paola A. Duard Brady, MD    DATE OF BIRTH:  [birth year]   DATE OF PROCEDURE:  02/27/2008  DATE OF DISCHARGE:                               OPERATIVE REPORT   PREOPERATIVE DIAGNOSES:  Complex hyperplasia with atypia, in the setting  of necrosis.  Cannot rule out a grade 1 endometrial carcinoma.   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   PROCEDURE:  A total laparoscopic hysterectomy, bilateral salpingo-  oophorectomy.   SURGEON:  Dr. Rejeana Brock A. Gehrig and Dr. Freddy Finner.   ASSISTANT:  Telford Nab, R.N.   ANESTHESIA:  General anesthesia.   ANESTHESIOLOGIST:  Dr. Jenelle Mages. Fortune.   ESTIMATED BLOOD LOSS:  Less than 50 mL.   IV FLUIDS:  Was 2000 mL.   URINE OUTPUT:  Was 200 mL.   COMPLICATIONS:  None.   SPECIMENS TO PATHOLOGY:  Included washings, cervix, uterus, bilateral  tubes and ovaries.   OPERATIVE FINDINGS:  Included a normal-appearing uterus, tubes and  ovaries.  Frozen section revealed a questionable grade 1 endometrial  adenocarcinoma with glandular crowding.  There did not appear to be any  myometrial invasion.  In the area that was most suspicious it appeared  to be adenomyosis with no evidence of invasive disease.   DESCRIPTION OF PROCEDURE:  The patient was taken to the operating room  and placed in the supine position, after being identified as [name],  and an informed consent was reviewed and on the chart.  Her arms were  tucked at her sides with all appropriate precautions.  The patient  denied any pain or discomfort in her arms.  General anesthesia was then  induced.  She was then placed in the dorsal lithotomy position with all  appropriate precautions.  Shoulder blocks were placed in the  usual  position.  The abdomen was prepped in the usual fashion.  The perineum  was prepped in the usual sterile fashion, as was the vagina.  A Foley  catheter was inserted into the vagina under sterile conditions.  Time  out was performed to confirm the patient, the surgeons, procedures,  antibiotics and allergy status.  A sterile speculum was placed into the  vagina.  The cervix was clean without any visible lesions or discharge.  A sterile tenaculum was placed on the anterior lip of the cervix.  The  cervix sounded to 7 cm.  It was dilated without difficulty.  A ZUMI with  a medium Co-ring was then placed in the usual fashion with the pneumo-  balloon.  The patient was then draped in the usual fashion.  A  transverse infraumbilical incision was made in the area of the prior  tubal ligation incision.  We dissected down to the underlying fascia  using curved Mayo's.  The fascia was identified and nicked and entered  sharply.  The fascial edges  were secured with a UR-6 on a #0 Vicryl.  The Hasson was then placed.  The abdomen was insufflated.  At this point  and at all points, the patient's pressure never exceeding 15 mmHg.  She  was then placed in the Trendelenburg position, which she tolerated.  Anatomic findings were as discussed above.  The abdominal pelvic  washings were obtained, after placing bilateral 5 mm ports under direct  visualization 2 cm above and medially to the anterior superior iliac  spine.  A 10/12 suprapubic port was placed under direct visualization in  the usual fashion.  After the washings were obtained, our attention was  first drawn to the left side.  The round ligament on the left side was  transected with monopolar cautery.  The anterior posterior leafs of the  broad ligament were opened.  The ureter was identified on her left side.  A window was made to trim the IP and the ureter.  The IP was coagulated  with bipolar cautery and transected.  The uterine artery  and vein were  then skeletonized using pinpoint dissection and monopolar cautery.  Going along the Co-ring, we dissected the posterior peritoneum off the  co-ring.  We then went anteriorly, dissecting the bladder off the co-  ring anteriorly.  Bipolar cautery was then used to coagulate the uterine  artery and vein on the left side.  A similar procedure was performed on  the right.  The pneumo-balloon was then insufflated.  We transected the  uterine artery on the patient's right side and made a C-loop to allow  the uterine artery to fall below the level of the pelvis.  A colpotomy  was made along the Co-ring and carried from approximately 11 o'clock to  the 7 o'clock position.   Our attention was then drawn to the patient's left side.  Once again,  the uterine vessels were coagulated with bipolar cautery.  Using  monopolar cautery, we transected the uterine vessels and the colpotomy  was completed.  The specimen was then delivered to the vagina without  difficulty.  Using three separate sutures of #0 Vicryl on an Endo  stitch, the vaginal cuff was closed using figure-of-eight fashion.  The  sutures were then cut.  The abdomen and pelvis were irrigated.  It was  inspected under low flow in all port sites.  The pedicles were noted to  be hemostatic.  At this point we allowed the patient to sit with her  abdomen under low flow for approximately five minutes until frozen  section returned.  Once frozen section returned, we then insufflated the  abdomen again.  There was no evidence of any active bleeding.  The  abdomen and pelvis were copiously irrigated.  The ports were removed  under direct visualization.  The pneumoperitoneum was released.   The UR-6 #0 Vicryl sutures were then reapproximated in the midline.  Two  additional figure-of-eight sutures of #0 Vicryl were placed to complete  the fascial closure in the infraumbilical region.  A UR-6 #0 Vicryl  single suture was placed in the  subcu of the suprapubic region.  The  skin was closed using #4-0 Vicryl.  Steri-Strips and Band-Aids were  placed.   All instrument, needle and Ray-Tec counts were correct x2.  The patient  was taken to the recovery room in stable condition.  There were no  complications.      Paola A. Duard Brady, MD  Electronically Signed     PAG/MEDQ  D:  02/27/2008  T:  02/27/2008  Job:  [id number]   cc:   Freddy Finner, M.D.  Fax: [phone]   Juline Patch, M.D.  Fax: [phone]   Telford Nab, R.N.  [address]  [place], [place] [address]

## 2011-03-16 NOTE — Consult Note (Signed)
[name], [name]                  ACCOUNT NO.:  [id number]   MEDICAL RECORD NO.:  [id number]          PATIENT TYPE:  OUT   LOCATION:  GYN                          FACILITY:  [facility]   PHYSICIAN:  Paola A. Duard Brady, MD    DATE OF BIRTH:  18-Mar-1941   DATE OF CONSULTATION:  04/16/2008  DATE OF DISCHARGE:                                 CONSULTATION   [name] a very pleasant 70 year old with a stage Ia grade 1  endometrioid adenocarcinoma.  However, she had myometrial invasion into  the deep levels of the myometrium.  There was a floaty small area of  atypical endometrial type glands within the lumen for the tube on the  right side consistent with a floater.  There has been extensive  discussion regarding whether or not additional surgery versus  postoperative radiation therapy should ensue.  I saw the patient with  her husband in a conference on May 5th and our decision was then to  proceed with pelvic radiotherapy.  She has been seen in consultation  kindly at the request of Dr. Roselind Messier and has been tattooed and is ready  for simulation and treatment next week.  I have consulted with Dr.  Roselind Messier and Dr. 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  She is overall doing quite well.  She is continuing to recover  from the surgery well, really denies any bleeding.   PHYSICAL EXAMINATION:  Weight 207 pounds, blood pressure 115/77.  Well-nourished, well-developed female in no acute distress.  ABDOMEN:  Her incision is well-healed.  Abdomen is soft, nontender,  nondistended.  Groins are negative for adenopathy.  EXTREMITIES:  There is no edema.  PELVIC:  External genitalia is within normal limits.  The vagina is  atrophic.  The vaginal cuff is visualized.   There are no gross visible  lesions.  Bimanual examination reveals no cuff nodularity or tenderness.   ASSESSMENT:  A 70 year old with stage Ia grade 1 endometrioid  adenocarcinoma, who, however, has deep lymphovascular space involvement  to the deep levels of the myometrium.   RECOMMENDATIONS:  To undergo postoperative pelvic radiotherapy.  We  discussed the role of pelvic lymphadenectomy; however, at this point it  would not change our management, that we would most likely, even in  the setting of negative lymph nodes, recommend surgery and, if she had  positive lymph nodes, she would potentially increase the risks of  postoperative complications.  At this point, she will undergo therapy  under the care of Dr. Roselind Messier  and we will most likely see her back in 4  months.      Paola A. Duard Brady, MD  Electronically Signed     PAG/MEDQ  D:  04/16/2008  T:  04/16/2008  Job:  [id number]   cc:   Windy Fast MD Quentin Mulling, M.D.  Fax: [phone]   Telford Nab, R.N.  [address]  [place], [place] [address]   Billie Lade, M.D.  Fax: [phone]

## 2011-03-16 NOTE — Consult Note (Signed)
[name], [name]                  ACCOUNT NO.:  [id number]   MEDICAL RECORD NO.:  [id number]          PATIENT TYPE:  OUT   LOCATION:  GYN                          FACILITY:  [facility]   PHYSICIAN:  Paola A. Duard Brady, MD    DATE OF BIRTH:  1941-10-25   DATE OF CONSULTATION:  03/19/2009  DATE OF DISCHARGE:                                 CONSULTATION   HISTORY:  [name] a very pleasant 69 soon to be 70 year old with stage  IA grade 1 endometrioid adenocarcinoma.  She underwent surgery  consisting of laparoscopic hysterectomy and BSO in April 2009 for  complex hyperplasia with atypia.  Could not rule out a grade 1 cancer.  Frozen section revealed no evidence of myometrial invasion.  Therefore,  lymphadenectomy was not performed.  Final pathology, however, revealed  no myometrial invasion.  However, she had diffuse lymphovascular space  involvement including to the deep levels of the myometrium.  The cervix  and washings were negative.  There was a floating small area of atypical  endometrial type glands within the lumen of the right fallopian tube  consistent with a floater.  The right ovary was otherwise negative.  After lengthy discussion, she opted for pelvic and vaginal cuff  brachytherapy which she completed under the care of Dr. Roselind Messier.  I last  saw her in November 2009 at which time her exam and Pap smear were  unremarkable.  She was having some issues with diarrhea that was worse  if she ate a lot of roughage, raw fruits and vegetables.  She has been  under the care of Dr. Lina Sar for this.  She recently had a  colonoscopy that showed the colon to be inflamed.  When she had these  episodes of diarrhea and cramping, she feels that she is having gas  pains and will have some loose stools.  She has bleeding, but she thinks  it is irritation of the anus from wiping.  There is really no help with  Imodium.  It happens a few times a week.  It does not seem to matter  whether she has  eaten raw fruits and vegetables or other solid food.  It  does not typically happen at night.  It is only typically during the  day.  She states that as long as she does not have cancer, this is  something that she can live with.  She has not lost a significant amount  of weight and is able to maintain her weight.  She denies any vaginal  bleeding, any nausea, vomiting, fevers, chills, headaches, other  abdominal pelvic pain, weight loss or weight gain.   MEDICATIONS:  1. Toprol.  2. Celebrex.  3. Plaquenil.   ALLERGIES:  NONE.   PHYSICAL EXAMINATION:  VITAL SIGNS:  Weight 196 pounds, blood pressure  112/72.  NECK:  Supple.  There is no lymphadenopathy, no thyromegaly.  LUNGS:  Clear to auscultation bilaterally.  CARDIOVASCULAR:  Regular rate and rhythm.  ABDOMEN:  She has well-healed surgical incisions.  Abdomen is soft,  nontender, nondistended.  There are  no palpable masses or  hepatosplenomegaly.  Groins are negative for adenopathy.  EXTREMITIES:  There is no edema.  PELVIC:  External genitalia is within normal limits.  Vagina is markedly  atrophic.  The vaginal cuff is visualized.  There are no visible  lesions.  ThinPrep Pap was submitted without difficulty.  Bimanual  examination reveals no masses or nodularity.  RECTAL:  Deferred as she just recently had a colonoscopy.   ASSESSMENT:  A 70 year old with stage IA grade 1 endometrioid  adenocarcinoma who despite having no myometrial invasion with cancer had  almost 100% invasion of the lymphovascular spaces with cancer as well as  a floater in her fallopian tube.   PLAN:  We will follow up the results of her Pap smear from today.  She  will see Dr. Roselind Messier in August with an appointment.  She will return to  see [place] in November.  She will continue her follow up with Dr. Juanda Chance and  her other physicians as already scheduled.      Paola A. Duard Brady, MD  Electronically Signed     PAG/MEDQ  D:  03/19/2009  T:  03/19/2009   Job:  [id number]   cc:   Freddy Finner, M.D.  Fax: [phone]   Juline Patch, M.D.  Fax: [phone]   Hedwig Morton. Juanda Chance, MD  [address]  [place]  [place] [address]   Billie Lade, M.D.  Fax: [phone]   Telford Nab, R.N.  [address]  [place], [place] [address]

## 2011-03-17 ENCOUNTER — Other Ambulatory Visit: Payer: Self-pay | Admitting: Gynecologic Oncology

## 2011-03-17 ENCOUNTER — Ambulatory Visit: Payer: Medicare Other | Attending: Gynecologic Oncology | Admitting: Gynecologic Oncology

## 2011-03-17 ENCOUNTER — Other Ambulatory Visit (HOSPITAL_COMMUNITY)
Admission: RE | Admit: 2011-03-17 | Discharge: 2011-03-17 | Disposition: A | Discharge status: Home / Self Care | Payer: Medicare Other | Source: Ambulatory Visit | Attending: Gynecologic Oncology | Admitting: Gynecologic Oncology

## 2011-03-17 DIAGNOSIS — Z79899 Other long term (current) drug therapy: Secondary | ICD-10-CM | POA: Insufficient documentation

## 2011-03-17 DIAGNOSIS — C549 Malignant neoplasm of corpus uteri, unspecified: Secondary | ICD-10-CM | POA: Insufficient documentation

## 2011-03-17 DIAGNOSIS — Z923 Personal history of irradiation: Secondary | ICD-10-CM | POA: Insufficient documentation

## 2011-03-17 DIAGNOSIS — Z8540 Personal history of malignant neoplasm of unspecified female genital organ: Secondary | ICD-10-CM | POA: Insufficient documentation

## 2011-03-17 DIAGNOSIS — Z90710 Acquired absence of both cervix and uterus: Secondary | ICD-10-CM | POA: Insufficient documentation

## 2011-03-17 DIAGNOSIS — N3946 Mixed incontinence: Secondary | ICD-10-CM | POA: Insufficient documentation

## 2011-03-17 DIAGNOSIS — R197 Diarrhea, unspecified: Secondary | ICD-10-CM | POA: Insufficient documentation

## 2011-03-18 NOTE — Consult Note (Signed)
NAMEDOMINIQUE, [name]                  ACCOUNT NO.:  [id number]  MEDICAL RECORD NO.:  [id number]           PATIENT TYPE:  O  LOCATION:  GYN                          [facility]ACILITY:  [facility]  PHYSICIAN:  Chantilly Linskey A. Duard Brady, MD    DATE OF BIRTH:  1941-04-11  DATE OF CONSULTATION:  03/17/2011 DATE OF DISCHARGE:                                CONSULTATION   [name] is a very pleasant 70 year old with a stage IA, grade 1 endometrioid adenocarcinoma who underwent laparoscopic hysterectomy and BSO in April 2009 for complex hyperplasia with atypia.  Frozen section revealed no evidence of myometrial invasion, therefore lymphadenectomy was not performed.  However, on final pathology, there was diffuse lymphovascular space involvement including to the deep levels of the myometrium and the cervix.  Washings were negative.  There was a floater, small area of atypical endometrial type glands of the right fallopian tube, again consistent with a floater.  After extensive discussion, she opted for pelvic and vaginal cuff brachytherapy which she completed.  I last saw her in May 2011 at which time her exam was unremarkable as was her Pap smear.  She was seen by Dr. Roselind Messier in November 2011 with a negative exam.  She comes in today for followup.  REVIEW OF SYSTEMS:  She does continue to have diarrhea which lasted a couple of days at a time.  There is no particular triggers for the diarrhea.  It does get better with Imodium, but takes 8-9 Imodium to make this better.  There is no blood in her stool.  Her incontinence is the same as a combination of both stress incontinence, she leak a little bit when she laughs and sneezes as well as urge incontinence.  There is no blood in the stool or vaginal bleeding.  We had discussed some bladder training the last time.  She does not recall that conversation and we discussed it again today.  I have encouraged her to do some Kegel exercises as well as to try to do some  timed-void, so her bladder is not quite as full.  She will follow up with Dr. Jennette Kettle regarding this.  She otherwise has 10-point negative review of systems.  She denies any chest pain, short of breath, nausea, vomiting, fever, chills, headaches, visual changes, unintentional weight loss, weight gain, early satiety, abdominal or pelvic bloating.  MEDICATION LIST:  Reviewed includes Toprol, calcium, fish oil, and multivitamins.  FAMILY HISTORY:  There are no new medical problems.  HEALTH MAINTENANCE:  She is up-to-date on her mammogram.  She had a colonoscopy in 2010, does not need another one for 10 years.  PHYSICAL EXAMINATION:  VITAL SIGNS:  Weight 202 pounds. GENERAL:  Well-nourished, well-developed female, in no acute distress. NECK:  Supple.  There is no lymphadenopathy, no thyromegaly. LUNGS:  Clear to auscultation bilaterally. CARDIOVASCULAR:  Regular rate and rhythm. ABDOMEN:  Soft, nontender, nondistended.  There are no palpable masses or hepatosplenomegaly.  Groins are negative for adenopathy. EXTREMITIES:  There is 1+ to 2+ nonpitting edema equal bilaterally. PELVIC:  External genitalia is within normal limits though atrophic. The vaginal  cuff is markedly atrophic.  There are no gross visible lesions.  There are postradiation changes.  ThinPrep Pap was submitted without difficulty.  Bimanual examination reveals no masses or nodularity.  Rectal confirms.  ASSESSMENT:  A 70 year old with a clinical stage IA, grade 1 endometrioid adenocarcinoma who underwent postoperative radiation therapy secondary to diffuse lymphovascular space involvement of her grade 1 tumor throughout the myometrium.  She clinically has no evidence of recurrent disease about 3 years after the diagnosis.  PLAN: 1. We will follow up the results for Pap smear from today. 2. She will see Dr. Roselind Messier in 6 months, will follow up with Dr. Jennette Kettle     for her routine GYN needs and will also discuss her  incontinence     with him at that time to see if further workup and evaluation for     both urge and stress incontinence as indicated. 3. She will return to see me in 1 year.  We will contact her with the     results of her Pap smear.     Marcellene Shivley A. Duard Brady, MD     PAG/MEDQ  D:  03/17/2011  T:  03/18/2011  Job:  [id number]  cc:   Juline Patch, M.D. Fax: [phone]  Varney Baas, MD  Telford Nab, R.N. [address] [place], [place] [address]  Billie Lade, Ph.D., M.D. Fax: [phone]  Electronically Signed by Cleda Mccreedy MD on 03/18/2011 02:06:19 PM

## 2011-03-19 NOTE — Op Note (Signed)
[name], [name]                  ACCOUNT NO.:  [id number]   MEDICAL RECORD NO.:  [id number]          PATIENT TYPE:  AMB   LOCATION:  SDC                           FACILITY:  [facility]   PHYSICIAN:  Freddy Finner, M.D.   DATE OF BIRTH:  1941/09/27   DATE OF PROCEDURE:  DATE OF DISCHARGE:                               OPERATIVE REPORT   DATE OF PROCEDURE:  January 27, 2007.   PREOPERATIVE DIAGNOSES:  1. Postmenopausal bleeding.  2. Endometrial polyp.  3. Intramural myoma.   POSTOPERATIVE DIAGNOSES:  1. Postmenopausal bleeding.  2. Endometrial polyp.  3. Intramural myoma.   OPERATIVE PROCEDURE:  Hysteroscopy, D&C and resection of polyp.   SURGEON:  Freddy Finner, M.D.   ANESTHESIA:  General and paracervical block with 1% plain Xylocaine.  Injections were made at 4 and 8 o'clock in the vaginal fornices.   ESTIMATED INTRAOPERATIVE BLOOD LOSS:  Less than 10 mL.   SORBITOL DEFICIT:  30 mL.   INTRAOPERATIVE COMPLICATIONS:  None.   HISTORY OF PRESENT ILLNESS:  The patient is a 70 year old who had an  episode of postmenopausal bleeding.  She was on hormone replacement  therapy.  At the time of histogram in the office showed an endometrial  polyp and perhaps a little thickening in the endometrium and also showed  an intramural myoma.  She is admitted now for hysteroscopy and D&C.   OPERATIVE PROCEDURE:  She was admitted on the morning of surgery.  She  was given a 1-gm bolus of Ancef.  She was brought to the operating room,  placed under adequate general anesthesia, placed in the dorsal lithotomy  position.  Betadine prep of mons, perineum and vagina was carried out in  the usual fashion.  Bladder was evacuated with a sterile catheter under  sterile technique.  Sterile drapes were applied.  Bivalve vaginal  speculum was introduced.  Cervix was visualized and grasped on the  anterior lobe with a single-tooth tenaculum.  Paracervical block was  placed using 10 mL of 1% Xylocaine, and  injections were made at 4 and 8  o'clock in the vaginal fornices.  Cervical os was stenotic.  Os was  explored with an os finder.  Uterus deviated to the right.  Uterus  sounded to 7 cm.  Progressive dilatation was accomplished with prep  dilators.  A 12.5-degree hysteroscope was introduced using 3% sorbitol  as distending medium.  Polyp was identified.  The cavity was otherwise  normal.  Gentle, thorough curettage was accomplished, and exploration  with Randall stone forceps.  Repeat inspection revealed adequate  endometrial sampling and resection of the polyp.  The procedure, at this  point, was terminated, and instruments were removed.  She was given  Toradol  30 mg IV and 30 mg IM.  Upon completion of the procedure, she was awake,  and taken to the recovery room in good condition.      Freddy Finner, M.D.  Electronically Signed     WRN/MEDQ  D:  01/27/2007  T:  01/27/2007  Job:  [id number]

## 2011-07-27 LAB — COMPREHENSIVE METABOLIC PANEL
ALT: 32
AST: 24
Albumin: 3.8
Alkaline Phosphatase: 45
BUN: 14
CO2: 29
Calcium: 9.2
Chloride: 107
Creatinine, Ser: 0.87
GFR calc Af Amer: >60
GFR calc non Af Amer: >60
Glucose, Bld: 89
Potassium: 4.1
Sodium: 142
Total Bilirubin: 1.3 — ABNORMAL HIGH
Total Protein: 6.4

## 2011-07-27 LAB — DIFFERENTIAL
Basophils Absolute: 0
Basophils Relative: 0
Eosinophils Absolute: 0.1
Eosinophils Relative: 2
Lymphocytes Relative: 24
Lymphs Abs: 1.3
Monocytes Absolute: 0.7
Monocytes Relative: 12
Neutro Abs: 3.4
Neutrophils Relative %: 61

## 2011-07-27 LAB — CBC
HCT: 34.1 — ABNORMAL LOW
HCT: 40.7
Hemoglobin: 11.9 — ABNORMAL LOW
Hemoglobin: 13.8
MCHC: 33.9
MCHC: 34.9
MCV: 89.9
MCV: 90.7
Platelets: 144 — ABNORMAL LOW
Platelets: 186
RBC: 3.79 — ABNORMAL LOW
RBC: 4.48
RDW: 13.2
RDW: 13.9
WBC: 14.9 — ABNORMAL HIGH
WBC: 5.5

## 2011-07-27 LAB — TYPE AND SCREEN
ABO/RH(D): A POS
Antibody Screen: NEGATIVE

## 2011-07-27 LAB — ABO/RH: ABO/RH(D): A POS

## 2011-09-16 DIAGNOSIS — B9789 Other viral agents as the cause of diseases classified elsewhere: Secondary | ICD-10-CM | POA: Insufficient documentation

## 2011-09-16 DIAGNOSIS — R197 Diarrhea, unspecified: Secondary | ICD-10-CM | POA: Insufficient documentation

## 2011-09-16 DIAGNOSIS — R1013 Epigastric pain: Secondary | ICD-10-CM | POA: Insufficient documentation

## 2011-09-16 DIAGNOSIS — R112 Nausea with vomiting, unspecified: Secondary | ICD-10-CM | POA: Insufficient documentation

## 2011-09-17 ENCOUNTER — Encounter: Payer: Self-pay | Admitting: Emergency Medicine

## 2011-09-17 ENCOUNTER — Emergency Department (HOSPITAL_COMMUNITY): Payer: Medicare Other

## 2011-09-17 ENCOUNTER — Inpatient Hospital Stay (HOSPITAL_COMMUNITY)
Admission: EM | Admit: 2011-09-17 | Discharge: 2011-09-20 | DRG: 392 | Disposition: A | Discharge status: Home / Self Care | Payer: Medicare Other | Attending: Internal Medicine | Admitting: Internal Medicine

## 2011-09-17 ENCOUNTER — Encounter (HOSPITAL_COMMUNITY): Payer: Self-pay | Admitting: Emergency Medicine

## 2011-09-17 ENCOUNTER — Emergency Department (HOSPITAL_COMMUNITY)
Admission: EM | Admit: 2011-09-17 | Discharge: 2011-09-17 | Disposition: A | Discharge status: Home / Self Care | Payer: Medicare Other | Attending: Emergency Medicine | Admitting: Emergency Medicine

## 2011-09-17 DIAGNOSIS — R1115 Cyclical vomiting syndrome unrelated to migraine: Secondary | ICD-10-CM | POA: Diagnosis present

## 2011-09-17 DIAGNOSIS — R1013 Epigastric pain: Secondary | ICD-10-CM

## 2011-09-17 DIAGNOSIS — E876 Hypokalemia: Secondary | ICD-10-CM | POA: Diagnosis present

## 2011-09-17 DIAGNOSIS — K769 Liver disease, unspecified: Secondary | ICD-10-CM | POA: Diagnosis present

## 2011-09-17 DIAGNOSIS — R911 Solitary pulmonary nodule: Secondary | ICD-10-CM | POA: Diagnosis present

## 2011-09-17 DIAGNOSIS — R197 Diarrhea, unspecified: Secondary | ICD-10-CM | POA: Diagnosis present

## 2011-09-17 DIAGNOSIS — K529 Noninfective gastroenteritis and colitis, unspecified: Secondary | ICD-10-CM

## 2011-09-17 DIAGNOSIS — I10 Essential (primary) hypertension: Secondary | ICD-10-CM | POA: Diagnosis present

## 2011-09-17 DIAGNOSIS — K625 Hemorrhage of anus and rectum: Secondary | ICD-10-CM

## 2011-09-17 DIAGNOSIS — E860 Dehydration: Secondary | ICD-10-CM

## 2011-09-17 DIAGNOSIS — Z8542 Personal history of malignant neoplasm of other parts of uterus: Secondary | ICD-10-CM

## 2011-09-17 DIAGNOSIS — K5730 Diverticulosis of large intestine without perforation or abscess without bleeding: Secondary | ICD-10-CM

## 2011-09-17 DIAGNOSIS — R1110 Vomiting, unspecified: Secondary | ICD-10-CM | POA: Diagnosis present

## 2011-09-17 DIAGNOSIS — B349 Viral infection, unspecified: Secondary | ICD-10-CM

## 2011-09-17 DIAGNOSIS — Z8719 Personal history of other diseases of the digestive system: Secondary | ICD-10-CM

## 2011-09-17 DIAGNOSIS — D649 Anemia, unspecified: Secondary | ICD-10-CM | POA: Diagnosis present

## 2011-09-17 DIAGNOSIS — C549 Malignant neoplasm of corpus uteri, unspecified: Secondary | ICD-10-CM

## 2011-09-17 HISTORY — DX: Essential (primary) hypertension: I10

## 2011-09-17 HISTORY — DX: Diverticulosis of large intestine without perforation or abscess without bleeding: K57.30

## 2011-09-17 LAB — CBC
HCT: 40.3 % (ref 36.0–46.0)
HCT: 38.3 % (ref 36.0–46.0)
Hemoglobin: 13.8 g/dL (ref 12.0–15.0)
Hemoglobin: 12.8 g/dL (ref 12.0–15.0)
MCH: 30.3 pg (ref 26.0–34.0)
MCH: 30.3 pg (ref 26.0–34.0)
MCHC: 34.2 g/dL (ref 30.0–36.0)
MCHC: 33.4 g/dL (ref 30.0–36.0)
MCV: 88.6 fL (ref 78.0–100.0)
MCV: 90.5 fL (ref 78.0–100.0)
Platelets: 181 10*3/uL (ref 150–400)
Platelets: 183 10*3/uL (ref 150–400)
RBC: 4.55 MIL/uL (ref 3.87–5.11)
RBC: 4.23 MIL/uL (ref 3.87–5.11)
RDW: 13 % (ref 11.5–15.5)
RDW: 13.3 % (ref 11.5–15.5)
WBC: 10.3 10*3/uL (ref 4.0–10.5)
WBC: 10 10*3/uL (ref 4.0–10.5)

## 2011-09-17 LAB — COMPREHENSIVE METABOLIC PANEL
ALT: 21 U/L (ref 0–35)
ALT: 17 U/L (ref 0–35)
AST: 18 U/L (ref 0–37)
AST: 15 U/L (ref 0–37)
Albumin: 3.9 g/dL (ref 3.5–5.2)
Albumin: 3.6 g/dL (ref 3.5–5.2)
Alkaline Phosphatase: 56 U/L (ref 39–117)
Alkaline Phosphatase: 54 U/L (ref 39–117)
BUN: 16 mg/dL (ref 6–23)
BUN: 13 mg/dL (ref 6–23)
CO2: 26 mEq/L (ref 19–32)
CO2: 27 mEq/L (ref 19–32)
Calcium: 9.7 mg/dL (ref 8.4–10.5)
Calcium: 9.3 mg/dL (ref 8.4–10.5)
Chloride: 102 mEq/L (ref 96–112)
Chloride: 103 mEq/L (ref 96–112)
Creatinine, Ser: 0.85 mg/dL (ref 0.50–1.10)
Creatinine, Ser: 0.83 mg/dL (ref 0.50–1.10)
GFR calc Af Amer: 80 mL/min — ABNORMAL LOW (ref >90)
GFR calc Af Amer: 82 mL/min — ABNORMAL LOW (ref >90)
GFR calc non Af Amer: 69 mL/min — ABNORMAL LOW (ref >90)
GFR calc non Af Amer: 71 mL/min — ABNORMAL LOW (ref >90)
Glucose, Bld: 145 mg/dL — ABNORMAL HIGH (ref 70–99)
Glucose, Bld: 125 mg/dL — ABNORMAL HIGH (ref 70–99)
Potassium: 4.1 mEq/L (ref 3.5–5.1)
Potassium: 3.9 mEq/L (ref 3.5–5.1)
Sodium: 138 mEq/L (ref 135–145)
Sodium: 138 mEq/L (ref 135–145)
Total Bilirubin: 0.7 mg/dL (ref 0.3–1.2)
Total Bilirubin: 0.9 mg/dL (ref 0.3–1.2)
Total Protein: 6.5 g/dL (ref 6.0–8.3)
Total Protein: 6.1 g/dL (ref 6.0–8.3)

## 2011-09-17 LAB — DIFFERENTIAL
Basophils Absolute: 0 10*3/uL (ref 0.0–0.1)
Basophils Absolute: 0 10*3/uL (ref 0.0–0.1)
Basophils Relative: 0 % (ref 0–1)
Basophils Relative: 0 % (ref 0–1)
Eosinophils Absolute: 0 10*3/uL (ref 0.0–0.7)
Eosinophils Absolute: 0 10*3/uL (ref 0.0–0.7)
Eosinophils Relative: 0 % (ref 0–5)
Eosinophils Relative: 0 % (ref 0–5)
Lymphocytes Relative: 4 % — ABNORMAL LOW (ref 12–46)
Lymphocytes Relative: 4 % — ABNORMAL LOW (ref 12–46)
Lymphs Abs: 0.5 10*3/uL — ABNORMAL LOW (ref 0.7–4.0)
Lymphs Abs: 0.4 10*3/uL — ABNORMAL LOW (ref 0.7–4.0)
Monocytes Absolute: 0.7 10*3/uL (ref 0.1–1.0)
Monocytes Absolute: 0.9 10*3/uL (ref 0.1–1.0)
Monocytes Relative: 7 % (ref 3–12)
Monocytes Relative: 9 % (ref 3–12)
Neutro Abs: 9.2 10*3/uL — ABNORMAL HIGH (ref 1.7–7.7)
Neutro Abs: 8.8 10*3/uL — ABNORMAL HIGH (ref 1.7–7.7)
Neutrophils Relative %: 89 % — ABNORMAL HIGH (ref 43–77)
Neutrophils Relative %: 88 % — ABNORMAL HIGH (ref 43–77)

## 2011-09-17 LAB — LIPASE, BLOOD
Lipase: 43 U/L (ref 11–59)
Lipase: 43 U/L (ref 11–59)

## 2011-09-17 MED ORDER — ONDANSETRON HCL 4 MG/2ML IJ SOLN
4.0000 mg | Freq: Once | INTRAMUSCULAR | Status: AC
Start: 2011-09-17 — End: 2011-09-17
  Administered 2011-09-17: 4 mg via INTRAVENOUS
  Filled 2011-09-17: qty 2

## 2011-09-17 MED ORDER — SODIUM CHLORIDE 0.9 % IV BOLUS (SEPSIS)
1000.0000 mL | Freq: Once | INTRAVENOUS | Status: AC
  Administered 2011-09-17: 1000 mL via INTRAVENOUS

## 2011-09-17 MED ORDER — SODIUM CHLORIDE 0.9 % IV BOLUS (SEPSIS)
500.0000 mL | Freq: Once | INTRAVENOUS | Status: AC
  Administered 2011-09-17: 500 mL via INTRAVENOUS

## 2011-09-17 MED ORDER — METOCLOPRAMIDE HCL 5 MG/ML IJ SOLN
INTRAMUSCULAR | Status: AC
  Administered 2011-09-17: 10 mg via INTRAVENOUS
  Filled 2011-09-17: qty 2

## 2011-09-17 MED ORDER — ONDANSETRON 8 MG PO TBDP: 8.0000 mg | ORAL_TABLET | Freq: Three times a day (TID) | ORAL | Status: AC | PRN

## 2011-09-17 MED ORDER — SODIUM CHLORIDE 0.9 % IV SOLN
INTRAVENOUS | Status: DC
  Administered 2011-09-17: 06:00:00 via INTRAVENOUS

## 2011-09-17 MED ORDER — ONDANSETRON HCL 4 MG/2ML IJ SOLN
4.0000 mg | Freq: Once | INTRAMUSCULAR | Status: AC
  Administered 2011-09-17: 4 mg via INTRAVENOUS
  Filled 2011-09-17: qty 2

## 2011-09-17 MED ORDER — MORPHINE SULFATE 4 MG/ML IJ SOLN
4.0000 mg | Freq: Once | INTRAMUSCULAR | Status: AC
  Administered 2011-09-17: 4 mg via INTRAVENOUS
  Filled 2011-09-17: qty 1

## 2011-09-17 MED ORDER — PANTOPRAZOLE SODIUM 40 MG IV SOLR
40.0000 mg | Freq: Once | INTRAVENOUS | Status: AC
  Administered 2011-09-17: 40 mg via INTRAVENOUS
  Filled 2011-09-17: qty 40

## 2011-09-17 MED ORDER — IOHEXOL 300 MG/ML  SOLN
100.0000 mL | Freq: Once | INTRAMUSCULAR | Status: AC | PRN
  Administered 2011-09-17: 100 mL via INTRAVENOUS

## 2011-09-17 NOTE — ED Notes (Signed)
Pt presenting to ed with c/o nausea and vomiting since yesterday. Pt states seen here yesterday for the same and she's not feeling any better. Pt states she's vomiting bile and hasn't been able to keep anything down

## 2011-09-17 NOTE — ED Notes (Signed)
Waiting on Husband to bring pt home

## 2011-09-17 NOTE — ED Provider Notes (Signed)
History     CSN: [id number] Arrival date & time: 09/17/2011  5:09 PM   First MD Initiated Contact with Patient 09/17/11 1912      Chief Complaint  Patient presents with  . Emesis    (Consider location/radiation/quality/duration/timing/severity/associated sxs/prior treatment) Patient is a 70 y.o. female presenting with vomiting. The history is provided by the patient (Patient complains of vomiting for a few days she was seen here yesterday and sent home and has continued to).  Emesis  This is a new problem. The current episode started 2 days ago. The problem occurs 2 to 4 times per day. The problem has not changed since onset.There has been no fever. Pertinent negatives include no abdominal pain, no chills, no cough, no diarrhea, no fever and no headaches.    Past Medical History  Diagnosis Date  . Cancer   . Hypertension     Past Surgical History  Procedure Date  . Abdominal hysterectomy   . Tear duct probing     History reviewed. No pertinent family history.  History  Substance Use Topics  . Smoking status: Never Smoker   . Smokeless tobacco: Not on file  . Alcohol Use: Yes     less then 2 a month    OB History    Grav Para Term Preterm Abortions TAB SAB Ect Mult Living                  Review of Systems  Constitutional: Negative for fever, chills and fatigue.  HENT: Negative for congestion, sinus pressure and ear discharge.   Eyes: Negative for discharge.  Respiratory: Negative for cough.   Cardiovascular: Negative for chest pain.  Gastrointestinal: Positive for vomiting. Negative for abdominal pain and diarrhea.  Genitourinary: Negative for frequency and hematuria.  Musculoskeletal: Negative for back pain.  Skin: Negative for rash.  Neurological: Negative for seizures and headaches.  Hematological: Negative.   Psychiatric/Behavioral: Negative for hallucinations.    Allergies  Review of patient's allergies indicates no known allergies.  Home  Medications   Current Outpatient Rx  Name Route Sig Dispense Refill  . ASPIRIN EC 81 MG PO TBEC Oral Take 81 mg by mouth daily.      . B COMPLEX PO TABS Oral Take 1 tablet by mouth daily.      Marland Kitchen CALCIUM CARBONATE 1250 MG PO TABS Oral Take 1 tablet by mouth daily.      Marland Kitchen VITAMIN D 2000 UNITS PO TABS Oral Take 2,000 Units by mouth daily.      Marland Kitchen METOPROLOL SUCCINATE 25 MG PO TB24 Oral Take 25 mg by mouth 2 (two) times daily.      Carma Leaven M PLUS PO TABS Oral Take 1 tablet by mouth daily.      Marland Kitchen ONDANSETRON 8 MG PO TBDP Oral Take 1 tablet (8 mg total) by mouth every 8 (eight) hours as needed for nausea. 10 tablet 0    BP 103/54  Pulse 103  Temp(Src) 98.6 F (37 C) (Oral)  Resp 15  SpO2 96%  Physical Exam  Constitutional: She is oriented to person, place, and time. She appears well-developed.  HENT:  Head: Normocephalic and atraumatic.  Eyes: Conjunctivae and EOM are normal. No scleral icterus.  Neck: Neck supple. No thyromegaly present.  Cardiovascular: Normal rate and regular rhythm.  Exam reveals no gallop and no friction rub.   No murmur heard. Pulmonary/Chest: No stridor. She has no wheezes. She has no rales. She exhibits no tenderness.  Abdominal: She exhibits no distension. There is no tenderness. There is no rebound.  Musculoskeletal: Normal range of motion. She exhibits no edema.  Lymphadenopathy:    She has no cervical adenopathy.  Neurological: She is oriented to person, place, and time. Coordination normal.  Skin: No rash noted. No erythema.  Psychiatric: She has a normal mood and affect. Her behavior is normal.    ED Course  Procedures (including critical care time)  Labs Reviewed  DIFFERENTIAL - Abnormal; Notable for the following:    Neutrophils Relative 88 (*)    Neutro Abs 8.8 (*)    Lymphocytes Relative 4 (*)    Lymphs Abs 0.4 (*)    All other components within normal limits  COMPREHENSIVE METABOLIC PANEL - Abnormal; Notable for the following:    Glucose,  Bld 125 (*)    GFR calc non Af Amer 71 (*)    GFR calc Af Amer 82 (*)    All other components within normal limits  CBC  LIPASE, BLOOD   US Abdomen Complete  09/17/2011  *RADIOLOGY REPORT*  Clinical Data:  Right upper quadrant abdominal pain, nausea and vomiting.  ABDOMINAL ULTRASOUND COMPLETE  Comparison:  Abdominal ultrasound performed 04/11/2009  Findings:  Gallbladder:  The gallbladder is normal in appearance, without evidence for gallstones, gallbladder wall thickening or pericholecystic fluid.  No ultrasonographic Murphy's sign is elicited.  Common Bile Duct:  0.4 cm in diameter; within normal limits in caliber.  Liver:  Mildly inhomogeneous in appearance, without significant fatty infiltration; this appears stable from 2010, and is likely within normal limits.  No focal lesions identified.  Limited Doppler evaluation demonstrates normal blood flow within the liver.  IVC:  Unremarkable in appearance.  Pancreas:  Not well characterized due to overlying bowel gas.  Spleen:  8.5 cm in length; within normal limits in size and echotexture.  Right kidney:  10.2 cm in length; normal in size, configuration and parenchymal echogenicity.  No evidence of mass or hydronephrosis.  Left kidney:  10.6 cm in length; normal in size, configuration and parenchymal echogenicity.  No evidence of mass or hydronephrosis.  Abdominal Aorta:  Normal in caliber; no aneurysm identified.  Not visualized proximally due to overlying bowel gas.  IMPRESSION: Unremarkable abdominal ultrasound.  Original Report Authenticated By: Tonia Ghent, M.D.   Ct Abdomen Pelvis W Contrast  09/17/2011  *RADIOLOGY REPORT*  Clinical Data: Mid abdominal pain, nausea, vomiting.  CT ABDOMEN AND PELVIS WITH CONTRAST  Technique:  Multidetector CT imaging of the abdomen and pelvis was performed following the standard protocol during bolus administration of intravenous contrast.  Contrast: OMNIPAQUE IOHEXOL 300 MG/ML IV SOLN  Comparison:  Contemporaneous ultrasound  Findings: 3 mm nodule within the left lower lobe (series 4 image 8).  Mild aortic valve calcification.  Nonspecific 7 mm hypodensity within the right hepatic lobe. Unremarkable biliary system, spleen, pancreas, adrenal glands.  Symmetric renal enhancement.  No hydronephrosis or hydroureter.  Colonic diverticulosis.  No bowel obstruction.  There are a few loops of small bowel within the pelvis which demonstrate mucosal hyperenhancement and mild perienteric fluid.  No appreciable lymphadenopathy.  Thin-walled bladder.  Absent uterus.  No adnexal mass.  Normal caliber vasculature.  Scattered atherosclerotic calcification of the aorta and its branches.  Multilevel degenerative changes of the imaged spine. No acute or aggressive appearing osseous lesion.  IMPRESSION: Mild stranding / fluid in the pelvis, may reflect enteritis of adjacent small bowel.  The etiology is nonspecific, with differential including ischemic, inflammatory,  and infectious etiologies.  Colonic diverticulosis.  As the above described inflammation also abuts the colon, a less likely consideration would be diverticulitis.  There are no complicating features such as loculated fluid or free intraperitoneal air.  7 mm hypodensity within the right hepatic lobe is nonspecific and may reflect a non aggressive process. However, given the history of a primary malignancy, consider further characterization with an MRI follow-up.  3 mm pulmonary nodule within the left lower lobe is nonspecific. This also may be incidental however in the setting of a primary malignancy, a follow-up chest CT could be considered to evaluate the lungs in their entirety.  Original Report Authenticated By: Waneta Martins, M.D.     1. Abdominal pain   2. Vomiting       MDM   Results for orders placed during the hospital encounter of 09/17/11  CBC      Component Value Range   WBC 10.0  4.0 - 10.5 (K/uL)   RBC 4.23  3.87 - 5.11 (MIL/uL)    Hemoglobin 12.8  12.0 - 15.0 (g/dL)   HCT 16.1  09.6 - 04.5 (%)   MCV 90.5  78.0 - 100.0 (fL)   MCH 30.3  26.0 - 34.0 (pg)   MCHC 33.4  30.0 - 36.0 (g/dL)   RDW 40.9  81.1 - 91.4 (%)   Platelets 183  150 - 400 (K/uL)  DIFFERENTIAL      Component Value Range   Neutrophils Relative 88 (*) 43 - 77 (%)   Neutro Abs 8.8 (*) 1.7 - 7.7 (K/uL)   Lymphocytes Relative 4 (*) 12 - 46 (%)   Lymphs Abs 0.4 (*) 0.7 - 4.0 (K/uL)   Monocytes Relative 9  3 - 12 (%)   Monocytes Absolute 0.9  0.1 - 1.0 (K/uL)   Eosinophils Relative 0  0 - 5 (%)   Eosinophils Absolute 0.0  0.0 - 0.7 (K/uL)   Basophils Relative 0  0 - 1 (%)   Basophils Absolute 0.0  0.0 - 0.1 (K/uL)  COMPREHENSIVE METABOLIC PANEL      Component Value Range   Sodium 138  135 - 145 (mEq/L)   Potassium 3.9  3.5 - 5.1 (mEq/L)   Chloride 103  96 - 112 (mEq/L)   CO2 27  19 - 32 (mEq/L)   Glucose, Bld 125 (*) 70 - 99 (mg/dL)   BUN 13  6 - 23 (mg/dL)   Creatinine, Ser 7.82  0.50 - 1.10 (mg/dL)   Calcium 9.3  8.4 - 95.6 (mg/dL)   Total Protein 6.1  6.0 - 8.3 (g/dL)   Albumin 3.6  3.5 - 5.2 (g/dL)   AST 15  0 - 37 (U/L)   ALT 17  0 - 35 (U/L)   Alkaline Phosphatase 54  39 - 117 (U/L)   Total Bilirubin 0.9  0.3 - 1.2 (mg/dL)   GFR calc non Af Amer 71 (*) >90 (mL/min)   GFR calc Af Amer 82 (*) >90 (mL/min)  LIPASE, BLOOD      Component Value Range   Lipase 43  11 - 59 (U/L)   US Abdomen Complete  09/17/2011  *RADIOLOGY REPORT*  Clinical Data:  Right upper quadrant abdominal pain, nausea and vomiting.  ABDOMINAL ULTRASOUND COMPLETE  Comparison:  Abdominal ultrasound performed 04/11/2009  Findings:  Gallbladder:  The gallbladder is normal in appearance, without evidence for gallstones, gallbladder wall thickening or pericholecystic fluid.  No ultrasonographic Murphy's sign is elicited.  Common Bile Duct:  0.4 cm in diameter; within normal limits in caliber.  Liver:  Mildly inhomogeneous in appearance, without significant fatty infiltration;  this appears stable from 2010, and is likely within normal limits.  No focal lesions identified.  Limited Doppler evaluation demonstrates normal blood flow within the liver.  IVC:  Unremarkable in appearance.  Pancreas:  Not well characterized due to overlying bowel gas.  Spleen:  8.5 cm in length; within normal limits in size and echotexture.  Right kidney:  10.2 cm in length; normal in size, configuration and parenchymal echogenicity.  No evidence of mass or hydronephrosis.  Left kidney:  10.6 cm in length; normal in size, configuration and parenchymal echogenicity.  No evidence of mass or hydronephrosis.  Abdominal Aorta:  Normal in caliber; no aneurysm identified.  Not visualized proximally due to overlying bowel gas.  IMPRESSION: Unremarkable abdominal ultrasound.  Original Report Authenticated By: Tonia Ghent, M.D.   Ct Abdomen Pelvis W Contrast  09/17/2011  *RADIOLOGY REPORT*  Clinical Data: Mid abdominal pain, nausea, vomiting.  CT ABDOMEN AND PELVIS WITH CONTRAST  Technique:  Multidetector CT imaging of the abdomen and pelvis was performed following the standard protocol during bolus administration of intravenous contrast.  Contrast: OMNIPAQUE IOHEXOL 300 MG/ML IV SOLN  Comparison: Contemporaneous ultrasound  Findings: 3 mm nodule within the left lower lobe (series 4 image 8).  Mild aortic valve calcification.  Nonspecific 7 mm hypodensity within the right hepatic lobe. Unremarkable biliary system, spleen, pancreas, adrenal glands.  Symmetric renal enhancement.  No hydronephrosis or hydroureter.  Colonic diverticulosis.  No bowel obstruction.  There are a few loops of small bowel within the pelvis which demonstrate mucosal hyperenhancement and mild perienteric fluid.  No appreciable lymphadenopathy.  Thin-walled bladder.  Absent uterus.  No adnexal mass.  Normal caliber vasculature.  Scattered atherosclerotic calcification of the aorta and its branches.  Multilevel degenerative changes of the  imaged spine. No acute or aggressive appearing osseous lesion.  IMPRESSION: Mild stranding / fluid in the pelvis, may reflect enteritis of adjacent small bowel.  The etiology is nonspecific, with differential including ischemic, inflammatory, and infectious etiologies.  Colonic diverticulosis.  As the above described inflammation also abuts the colon, a less likely consideration would be diverticulitis.  There are no complicating features such as loculated fluid or free intraperitoneal air.  7 mm hypodensity within the right hepatic lobe is nonspecific and may reflect a non aggressive process. However, given the history of a primary malignancy, consider further characterization with an MRI follow-up.  3 mm pulmonary nodule within the left lower lobe is nonspecific. This also may be incidental however in the setting of a primary malignancy, a follow-up chest CT could be considered to evaluate the lungs in their entirety.  Original Report Authenticated By: Waneta Martins, M.D.            Benny Lennert, MD 09/17/11 [phone]

## 2011-09-17 NOTE — ED Notes (Signed)
No further vomiting since reglan was given

## 2011-09-17 NOTE — ED Notes (Signed)
Per pt s/s started earlier today while at work, after lunch pt repots not feeling well, went home and had x20 emesis in the past 8hr, states diarrhea present yesterday and today almost subsided, pt with Hx endometrial Ca. And pain in the upper quadrant, 7/10, last BM today around 1600, at this time normal

## 2011-09-17 NOTE — ED Provider Notes (Signed)
History     CSN: [id number] Arrival date & time: 09/17/2011 12:05 AM   First MD Initiated Contact with Patient 09/17/11 0143      Chief Complaint  Patient presents with  . Abdominal Pain  . Nausea  . Emesis  . Diarrhea    pt sates is secondary to the radiation     Patient is a 70 y.o. female presenting with abdominal pain, vomiting, and diarrhea. The history is provided by the patient. No language interpreter was used.  Abdominal Pain The primary symptoms of the illness include abdominal pain, vomiting and diarrhea. The current episode started 13 to 24 hours ago. The onset of the illness was sudden.  The abdominal pain began 13 to24 hours ago. The pain came on suddenly. The abdominal pain is located in the RUQ and epigastric region. The abdominal pain does not radiate. The severity of the abdominal pain is 6/10. The abdominal pain is relieved by nothing.  The vomiting began yesterday. Vomiting occurs more than 10 times per day. The emesis contains undigested food and stomach contents.  The illness is associated with eating. The patient states that she believes she is currently not pregnant. The patient has not had a change in bowel habit. Risk factors for an acute abdominal problem include being elderly. Symptoms associated with the illness do not include chills, diaphoresis, constipation, urgency, hematuria, frequency or back pain.  Emesis  Associated symptoms include abdominal pain and diarrhea. Pertinent negatives include no chills.  Diarrhea The primary symptoms include abdominal pain, vomiting and diarrhea.  The illness does not include chills, constipation or back pain.  Reports onset of nausea and vomiting at approximately 3:00 pm yesterday afternoon and has been  associated with approximately 20 episodes of nausea and vomiting and one episode of diarrhea. States her symptoms began approximately 1/2 hours after eating. Admits to tenderness in the right upper quadrant and  epigastrium.  States her only abdominal surgery was a remote abdominal hysterectomy and appendectomy s/p diagnosis of endometrial cancer.     ,  Past Medical History  Diagnosis Date  . Cancer   . Hypertension     Past Surgical History  Procedure Date  . Abdominal hysterectomy   . Tear duct probing     History reviewed. No pertinent family history.  History  Substance Use Topics  . Smoking status: Never Smoker   . Smokeless tobacco: Not on file  . Alcohol Use: Yes     less then 2 a month    OB History    Grav Para Term Preterm Abortions TAB SAB Ect Mult Living                  Review of Systems  Constitutional: Negative.  Negative for chills and diaphoresis.  HENT: Negative.   Eyes: Negative.   Respiratory: Negative.   Cardiovascular: Negative.   Gastrointestinal: Positive for vomiting, abdominal pain and diarrhea. Negative for constipation.  Genitourinary: Negative.  Negative for urgency, frequency and hematuria.  Musculoskeletal: Negative.  Negative for back pain.  Skin: Negative.   Neurological: Negative.   Hematological: Negative.   Psychiatric/Behavioral: Negative.     Allergies  Review of patient's allergies indicates no known allergies.  Home Medications   Current Outpatient Rx  Name Route Sig Dispense Refill  . ASPIRIN EC 81 MG PO TBEC Oral Take 81 mg by mouth daily.      . B COMPLEX PO TABS Oral Take 1 tablet by mouth daily.      Marland Kitchen  CALCIUM CARBONATE 1250 MG PO TABS Oral Take 1 tablet by mouth daily.      Marland Kitchen VITAMIN D 2000 UNITS PO TABS Oral Take 2,000 Units by mouth daily.      Marland Kitchen METOPROLOL SUCCINATE 25 MG PO TB24 Oral Take 25 mg by mouth 2 (two) times daily.      Carma Leaven M PLUS PO TABS Oral Take 1 tablet by mouth daily.        BP 152/84  Pulse 84  Temp(Src) 97.6 F (36.4 C) (Oral)  Resp 12  SpO2 95%  Physical Exam  Constitutional: She is oriented to person, place, and time. She appears well-developed and well-nourished.  HENT:  Head:  Normocephalic and atraumatic.  Eyes: Conjunctivae are normal.  Neck: Normal range of motion.  Cardiovascular: Normal rate and regular rhythm.   Pulmonary/Chest: Effort normal and breath sounds normal.  Abdominal: Soft. Bowel sounds are normal. There is tenderness in the right upper quadrant and epigastric area.    Musculoskeletal: Normal range of motion.  Neurological: She is alert and oriented to person, place, and time.  Skin: Skin is warm and dry.  Psychiatric: She has a normal mood and affect.    ED Course  Procedures Patient reports much improved after IV fluids and medications. Findings and clinical impression discussed with patient and likelihood that symptoms related to viral illness versus acute abdominal process. Will discharge home with medication for nausea and encourage close followup with primary care physician. Patient is agreeable with plan Labs Reviewed  DIFFERENTIAL - Abnormal; Notable for the following:    Neutrophils Relative 89 (*)    Neutro Abs 9.2 (*)    Lymphocytes Relative 4 (*)    Lymphs Abs 0.5 (*)    All other components within normal limits  COMPREHENSIVE METABOLIC PANEL - Abnormal; Notable for the following:    Glucose, Bld 145 (*)    GFR calc non Af Amer 69 (*)    GFR calc Af Amer 80 (*)    All other components within normal limits  CBC  LIPASE, BLOOD   No results found. No diagnosis found.    MDM   Labs and abdominal ultrasound without acute findings. No further vomiting since arrival to the emergency department. Abdominal pain much improved after medication. We'll give IV Protonix prior to discharge.          Leanne Chang, NP 09/17/11 [phone]

## 2011-09-17 NOTE — ED Notes (Signed)
Patient took 1.5 cups of contrast with 350 cc of emesis

## 2011-09-17 NOTE — ED Notes (Signed)
Patient sleeping soundly- no further emesis after reglan

## 2011-09-17 NOTE — ED Provider Notes (Signed)
Medical screening examination/treatment/procedure(s) were conducted as a shared visit with non-physician practitioner(s) and myself.  I personally evaluated the patient during the encounter  5:37 AM Mild epigastric tenderness. Abdomen soft and. Patient advised of essentially normal workup including normal ultrasound.   Hanley Seamen, MD 09/17/11 [phone]

## 2011-09-17 NOTE — ED Notes (Signed)
Pt reports nausea vomiting and mid/upper abdominal pain since yesterday. States that she last vomited around 11pm last night. Pt denies diarrhea, denies burning on urination,baginal discharge or vaginal odor.

## 2011-09-18 ENCOUNTER — Encounter (HOSPITAL_COMMUNITY): Payer: Self-pay | Admitting: *Deleted

## 2011-09-18 LAB — COMPREHENSIVE METABOLIC PANEL
ALT: 14 U/L (ref 0–35)
AST: 12 U/L (ref 0–37)
Albumin: 3.1 g/dL — ABNORMAL LOW (ref 3.5–5.2)
Alkaline Phosphatase: 47 U/L (ref 39–117)
BUN: 11 mg/dL (ref 6–23)
CO2: 26 mEq/L (ref 19–32)
Calcium: 8.4 mg/dL (ref 8.4–10.5)
Chloride: 106 mEq/L (ref 96–112)
Creatinine, Ser: 0.78 mg/dL (ref 0.50–1.10)
GFR calc Af Amer: >90 mL/min (ref >90)
GFR calc non Af Amer: 84 mL/min — ABNORMAL LOW (ref >90)
Glucose, Bld: 101 mg/dL — ABNORMAL HIGH (ref 70–99)
Potassium: 3.5 mEq/L (ref 3.5–5.1)
Sodium: 139 mEq/L (ref 135–145)
Total Bilirubin: 0.8 mg/dL (ref 0.3–1.2)
Total Protein: 5.3 g/dL — ABNORMAL LOW (ref 6.0–8.3)

## 2011-09-18 LAB — CBC
HCT: 35.1 % — ABNORMAL LOW (ref 36.0–46.0)
Hemoglobin: 11.5 g/dL — ABNORMAL LOW (ref 12.0–15.0)
MCH: 29.8 pg (ref 26.0–34.0)
MCHC: 32.8 g/dL (ref 30.0–36.0)
MCV: 90.9 fL (ref 78.0–100.0)
Platelets: 159 10*3/uL (ref 150–400)
RBC: 3.86 MIL/uL — ABNORMAL LOW (ref 3.87–5.11)
RDW: 13.5 % (ref 11.5–15.5)
WBC: 7.3 10*3/uL (ref 4.0–10.5)

## 2011-09-18 LAB — URINALYSIS, ROUTINE W REFLEX MICROSCOPIC
Bilirubin Urine: NEGATIVE
Glucose, UA: NEGATIVE mg/dL
Ketones, ur: 15 mg/dL — AB
Nitrite: NEGATIVE
Protein, ur: NEGATIVE mg/dL
Specific Gravity, Urine: 1.009 (ref 1.005–1.030)
Urobilinogen, UA: 0.2 mg/dL (ref 0.0–1.0)
pH: 6.5 (ref 5.0–8.0)

## 2011-09-18 LAB — URINE MICROSCOPIC-ADD ON

## 2011-09-18 LAB — MAGNESIUM: Magnesium: 2.1 mg/dL (ref 1.5–2.5)

## 2011-09-18 LAB — PHOSPHORUS: Phosphorus: 3 mg/dL (ref 2.3–4.6)

## 2011-09-18 LAB — TSH: TSH: 2.356 u[IU]/mL (ref 0.350–4.500)

## 2011-09-18 MED ORDER — ALUM & MAG HYDROXIDE-SIMETH 200-200-20 MG/5ML PO SUSP: 30.0000 mL | Freq: Four times a day (QID) | ORAL | Status: DC | PRN

## 2011-09-18 MED ORDER — CIPROFLOXACIN IN D5W 400 MG/200ML IV SOLN
400.0000 mg | Freq: Two times a day (BID) | INTRAVENOUS | Status: DC
  Administered 2011-09-18 – 2011-09-20 (×4): 400 mg via INTRAVENOUS
  Filled 2011-09-18 (×6): qty 200

## 2011-09-18 MED ORDER — SODIUM CHLORIDE 0.9 % IV SOLN
INTRAVENOUS | Status: DC
  Administered 2011-09-19 (×2): via INTRAVENOUS

## 2011-09-18 MED ORDER — METRONIDAZOLE IN NACL 5-0.79 MG/ML-% IV SOLN
500.0000 mg | Freq: Three times a day (TID) | INTRAVENOUS | Status: DC
  Administered 2011-09-18 – 2011-09-20 (×7): 500 mg via INTRAVENOUS
  Filled 2011-09-18 (×9): qty 100

## 2011-09-18 MED ORDER — METOPROLOL SUCCINATE ER 25 MG PO TB24
25.0000 mg | ORAL_TABLET | Freq: Two times a day (BID) | ORAL | Status: DC
  Administered 2011-09-18 – 2011-09-20 (×5): 25 mg via ORAL
  Filled 2011-09-18 (×9): qty 1

## 2011-09-18 MED ORDER — ONDANSETRON HCL 4 MG/2ML IJ SOLN
4.0000 mg | Freq: Four times a day (QID) | INTRAMUSCULAR | Status: DC | PRN
  Administered 2011-09-18 – 2011-09-20 (×7): 4 mg via INTRAVENOUS
  Filled 2011-09-18 (×7): qty 2

## 2011-09-18 MED ORDER — HYDROCODONE-ACETAMINOPHEN 5-325 MG PO TABS: 1.0000 | ORAL_TABLET | ORAL | Status: DC | PRN

## 2011-09-18 MED ORDER — GUAIFENESIN-DM 100-10 MG/5ML PO SYRP: 5.0000 mL | ORAL_SOLUTION | ORAL | Status: DC | PRN

## 2011-09-18 MED ORDER — SODIUM CHLORIDE 0.9 % IV SOLN
INTRAVENOUS | Status: AC
  Administered 2011-09-18: 03:00:00 via INTRAVENOUS

## 2011-09-18 MED ORDER — LIP MEDEX EX OINT
TOPICAL_OINTMENT | CUTANEOUS | Status: AC
  Filled 2011-09-18: qty 7

## 2011-09-18 MED ORDER — METOCLOPRAMIDE HCL 5 MG/ML IJ SOLN
5.0000 mg | Freq: Four times a day (QID) | INTRAMUSCULAR | Status: DC | PRN
  Administered 2011-09-18: 5 mg via INTRAVENOUS
  Filled 2011-09-18: qty 2

## 2011-09-18 MED ORDER — ALBUTEROL SULFATE (5 MG/ML) 0.5% IN NEBU: 2.5000 mg | INHALATION_SOLUTION | RESPIRATORY_TRACT | Status: DC | PRN

## 2011-09-18 MED ORDER — ASPIRIN EC 81 MG PO TBEC
81.0000 mg | DELAYED_RELEASE_TABLET | Freq: Every day | ORAL | Status: DC
  Administered 2011-09-18 – 2011-09-20 (×3): 81 mg via ORAL
  Filled 2011-09-18 (×3): qty 1

## 2011-09-18 MED ORDER — ACETAMINOPHEN 650 MG RE SUPP: 650.0000 mg | Freq: Four times a day (QID) | RECTAL | Status: DC | PRN

## 2011-09-18 NOTE — ED Notes (Signed)
hospitalist in to see patient for admission orders

## 2011-09-18 NOTE — H&P (Signed)
PCP:  Dr. [facility]  Chief Complaint:  Nausea vomiting abdominal pain  HPI: [name] is a 70 y.o. female   has a past medical history of Cancer; Hypertension; and Diverticulosis of colon.   Presented with  History of nausea vomiting and abdominal pain since yesterday she was seen for this in emergency department. She was given prescription for Zofran and sent home. Unfortunately her nausea and vomiting did not stop she continued to have abdominal pain and came back to emerge department. She could not tolerate much of any peels at home. CT scan was done here showed enteritis.  Review of Systems:    Pertinent Positives: Nausea vomiting abdominal pain  Pertinent Negatives: No fevers chills no recent diarrhea although she has chronic history of diarrhea. No chest pain no shortness of breath no significant leg swelling no neurological complaints Otherwise ROS are negative except for above, 10 systems were reviewed  Past Medical History: Past Medical History  Diagnosis Date  . Cancer     1A grade endometrial ca  . Hypertension   . Diverticulosis of colon    Past Surgical History  Procedure Date  . Abdominal hysterectomy   . Tear duct probing   . Appendectomy     1967  . Ectopic pregnancy surgery 1967     Medications: Prior to Admission medications   Medication Sig Start Date End Date Taking? Authorizing Provider  aspirin EC 81 MG tablet Take 81 mg by mouth daily.     Yes Historical Provider, MD  b complex vitamins tablet Take 1 tablet by mouth daily.     Yes Historical Provider, MD  calcium carbonate (OS-CAL - DOSED IN MG OF ELEMENTAL CALCIUM) 1250 MG tablet Take 1 tablet by mouth daily.     Yes Historical Provider, MD  Cholecalciferol (VITAMIN D) 2000 UNITS tablet Take 2,000 Units by mouth daily.     Yes Historical Provider, MD  metoprolol succinate (TOPROL-XL) 25 MG 24 hr tablet Take 25 mg by mouth 2 (two) times daily.     Yes Historical Provider, MD    Multiple Vitamins-Minerals (MULTIVITAMINS THER. W/MINERALS) TABS Take 1 tablet by mouth daily.     Yes Historical Provider, MD  ondansetron (ZOFRAN ODT) 8 MG disintegrating tablet Take 1 tablet (8 mg total) by mouth every 8 (eight) hours as needed for nausea. 09/17/11 09/24/11 Yes Roma Kayser Schorr, NP    Allergies:  No Known Allergies  Social History:  reports that she has never smoked. She does not have any smokeless tobacco history on file. She reports that she drinks alcohol. She reports that she does not use illicit drugs.   Family History: family history includes Breast cancer in her mother; Cancer in her mother; Diabetes type II in her mother and sister; and Leukemia in her father.    Physical Exam: Patient Vitals for the past 24 hrs:  BP Temp Temp src Pulse Resp SpO2  09/17/11 2132 103/54 mmHg 98.6 F (37 C) Oral 103  15  96 %  09/17/11 1740 135/85 mmHg 98.8 F (37.1 C) Oral 86  20  98 %     Alert and Oriented in No Acute distress Mucous Membranes and Skin: Dry mucous membranes  slightly decreased skin turgor Head Non traumatic Heart: Regular rate and rhythm Lungs: Clear to auscultation bilaterally without rhonchi or wheezes Abdomen: Soft there is some left low quadrant tenderness but no peritoneal signs Lower extremities: Obese but no edema Neurologically Grossly intact:  Skin  clean Dry and intact no rash:  body mass index is unknown because there is no height or weight on file.   Labs on Admission:   [facility] 09/17/11 1925 09/17/11 0230  NA 138 138  K 3.9 4.1  CL 103 102  CO2 27 26  GLUCOSE 125* 145*  BUN 13 16  CREATININE 0.83 0.85  CALCIUM 9.3 9.7  MG -- --  PHOS -- --    Basename 09/17/11 1925 09/17/11 0230  AST 15 18  ALT 17 21  ALKPHOS 54 56  BILITOT 0.9 0.7  PROT 6.1 6.5  ALBUMIN 3.6 3.9    Basename 09/17/11 1925 09/17/11 0230  LIPASE 43 43  AMYLASE -- --    Basename 09/17/11 1925 09/17/11 0230  WBC 10.0 10.3  NEUTROABS 8.8* 9.2*   HGB 12.8 13.8  HCT 38.3 40.3  MCV 90.5 88.6  PLT 183 181   No results found for this basename: CKTOTAL:3,CKMB:3,CKMBINDEX:3,TROPONINI:3 in the last 72 hours No results found for this basename: TSH,T4TOTAL,FREET3,T3FREE,THYROIDAB in the last 72 hours No results found for this basename: VITAMINB12:2,FOLATE:2,FERRITIN:2,TIBC:2,IRON:2,RETICCTPCT:2 in the last 72 hours No results found for this basename: HGBA1C    The CrCl is unknown because both a height and weight (above a minimum accepted value) are required for this calculation. ABG No results found for this basename: phart, pco2, po2, hco3, tco2, acidbasedef, o2sat     No results found for this basename: DDIMER        Blood Culture No results found for this basename: sdes, specrequest, cult, reptstatus       Radiological Exams on Admission: US Abdomen Complete  09/17/2011  *RADIOLOGY REPORT*  Clinical Data:  Right upper quadrant abdominal pain, nausea and vomiting.  ABDOMINAL ULTRASOUND COMPLETE  Comparison:  Abdominal ultrasound performed 04/11/2009  Findings:  Gallbladder:  The gallbladder is normal in appearance, without evidence for gallstones, gallbladder wall thickening or pericholecystic fluid.  No ultrasonographic Murphy's sign is elicited.  Common Bile Duct:  0.4 cm in diameter; within normal limits in caliber.  Liver:  Mildly inhomogeneous in appearance, without significant fatty infiltration; this appears stable from 2010, and is likely within normal limits.  No focal lesions identified.  Limited Doppler evaluation demonstrates normal blood flow within the liver.  IVC:  Unremarkable in appearance.  Pancreas:  Not well characterized due to overlying bowel gas.  Spleen:  8.5 cm in length; within normal limits in size and echotexture.  Right kidney:  10.2 cm in length; normal in size, configuration and parenchymal echogenicity.  No evidence of mass or hydronephrosis.  Left kidney:  10.6 cm in length; normal in size, configuration  and parenchymal echogenicity.  No evidence of mass or hydronephrosis.  Abdominal Aorta:  Normal in caliber; no aneurysm identified.  Not visualized proximally due to overlying bowel gas.  IMPRESSION: Unremarkable abdominal ultrasound.  Original Report Authenticated By: Tonia Ghent, M.D.   Ct Abdomen Pelvis W Contrast  09/17/2011  *RADIOLOGY REPORT*  Clinical Data: Mid abdominal pain, nausea, vomiting.  CT ABDOMEN AND PELVIS WITH CONTRAST  Technique:  Multidetector CT imaging of the abdomen and pelvis was performed following the standard protocol during bolus administration of intravenous contrast.  Contrast: OMNIPAQUE IOHEXOL 300 MG/ML IV SOLN  Comparison: Contemporaneous ultrasound  Findings: 3 mm nodule within the left lower lobe (series 4 image 8).  Mild aortic valve calcification.  Nonspecific 7 mm hypodensity within the right hepatic lobe. Unremarkable biliary system, spleen, pancreas, adrenal glands.  Symmetric renal enhancement.  No  hydronephrosis or hydroureter.  Colonic diverticulosis.  No bowel obstruction.  There are a few loops of small bowel within the pelvis which demonstrate mucosal hyperenhancement and mild perienteric fluid.  No appreciable lymphadenopathy.  Thin-walled bladder.  Absent uterus.  No adnexal mass.  Normal caliber vasculature.  Scattered atherosclerotic calcification of the aorta and its branches.  Multilevel degenerative changes of the imaged spine. No acute or aggressive appearing osseous lesion.  IMPRESSION: Mild stranding / fluid in the pelvis, may reflect enteritis of adjacent small bowel.  The etiology is nonspecific, with differential including ischemic, inflammatory, and infectious etiologies.  Colonic diverticulosis.  As the above described inflammation also abuts the colon, a less likely consideration would be diverticulitis.  There are no complicating features such as loculated fluid or free intraperitoneal air.  7 mm hypodensity within the right hepatic lobe is  nonspecific and may reflect a non aggressive process. However, given the history of a primary malignancy, consider further characterization with an MRI follow-up.  3 mm pulmonary nodule within the left lower lobe is nonspecific. This also may be incidental however in the setting of a primary malignancy, a follow-up chest CT could be considered to evaluate the lungs in their entirety.  Original Report Authenticated By: Waneta Martins, M.D.    Assessment/Plan  Present on Admission:  .Enteritis - per CT scan, supportive treatment at this time patient is not febrile will hold off on IV antibiotics, CT scan did not show definite colitis. Will put him bowel rest  .HYPERTENSION - stable continue current medications of Toprol  .Diarrhea - this is a chronic problem and she states currently has not been bothered as much   .Vomiting - likely secondary to enteritis we'll continue the Reglan and Zofran seemed to make her feel sick  .Dehydration - we'll give IV fluids, monitor check orthostatics in the morning   Prophylaxis:  CODE STATUS:   Dayvin Aber 09/18/2011, 12:23 AM

## 2011-09-18 NOTE — Progress Notes (Addendum)
Subjective: Patient gives history of intractable nausea, vomiting and abdominal pain since Thursday. She denies any sickly contacts, recent travel or eating anything unusual. The first day she had almost 20 episodes of vomiting. There is no coffee grounds or blood. Her abdominal pain is in the mid-upper abdomen and suprapubic region. It is constant. No diarrhea.  Since admission she says that her vomiting has subsided and she has not had any further episodes since the emergency room. She still has intermittent nausea and dry heaves. She has not tried to eat or drink anything. Her abdominal pain is slightly better. She has had a normal BM today.  Per patient Dr. Bonnye Fava tried a colonoscopy but was incomplete in 2010 but showed diverticulitis.  Objective: Blood pressure 130/80, pulse 83, temperature 97.3 F (36.3 C), temperature source Oral, resp. rate 18, height 5' 4.5" (1.638 m), weight 89.1 kg (196 lb 6.9 oz), SpO2 95.00%.  Intake/Output Summary (Last 24 hours) at 09/18/11 1417 Last data filed at 09/18/11 1348  Gross per 24 hour  Intake 1318.33 ml  Output    825 ml  Net 493.33 ml   General exam: Patient is comfortable. Respiratory system: Clear. No increased work of breathing. Cardiovascular system: First and second heart sounds heard, regular. Gastrointestinal system: Abdomen is nondistended. Mild tenderness in the epigastric and suprapubic region. Abdomen otherwise soft. No rigidity guarding or rebound. Normal bowel sounds heard. Central nervous system: Alert and oriented. No focal neurological deficits Mucosa: Mildly dry.  Lab Results: Basic Metabolic Panel:  Basename 09/18/11 0441 09/17/11 1925  NA 139 138  K 3.5 3.9  CL 106 103  CO2 26 27  GLUCOSE 101* 125*  BUN 11 13  CREATININE 0.78 0.83  CALCIUM 8.4 9.3  MG 2.1 --  PHOS 3.0 --   Liver Function Tests:  [facility] 09/18/11 0441 09/17/11 1925  AST 12 15  ALT 14 17  ALKPHOS 47 54  BILITOT 0.8 0.9  PROT 5.3* 6.1    ALBUMIN 3.1* 3.6    Basename 09/17/11 1925 09/17/11 0230  LIPASE 43 43  AMYLASE -- --   No results found for this basename: AMMONIA:2 in the last 72 hours CBC:  Basename 09/18/11 0441 09/17/11 1925 09/17/11 0230  WBC 7.3 10.0 --  NEUTROABS -- 8.8* 9.2*  HGB 11.5* 12.8 --  HCT 35.1* 38.3 --  MCV 90.9 90.5 --  PLT 159 183 --   Cardiac Enzymes: No results found for this basename: CKTOTAL:3,CKMB:3,CKMBINDEX:3,TROPONINI:3 in the last 72 hours BNP: No results found for this basename: POCBNP:3 in the last 72 hours D-Dimer: No results found for this basename: DDIMER:2 in the last 72 hours CBG: No results found for this basename: GLUCAP:6 in the last 72 hours Hemoglobin A1C: No results found for this basename: HGBA1C in the last 72 hours Fasting Lipid Panel: No results found for this basename: CHOL,HDL,LDLCALC,TRIG,CHOLHDL,LDLDIRECT in the last 72 hours Thyroid Function Tests:  Basename 09/18/11 0441  TSH 2.356  T4TOTAL --  FREET4 --  T3FREE --  THYROIDAB --   Anemia Panel: No results found for this basename: VITAMINB12,FOLATE,FERRITIN,TIBC,IRON,RETICCTPCT in the last 72 hours Coagulation: No results found for this basename: LABPROT:2,INR:2 in the last 72 hours Urine Drug Screen:  Alcohol Level: No results found for this basename: ETH:2 in the last 72 hours Urinalysis:  Misc. Labs:   Micro Results: No results found for this or any previous visit (from the past 240 hour(s)).  Studies/Results: US Abdomen Complete  09/17/2011  *RADIOLOGY REPORT*  Clinical  Data:  Right upper quadrant abdominal pain, nausea and vomiting.  ABDOMINAL ULTRASOUND COMPLETE  Comparison:  Abdominal ultrasound performed 04/11/2009  Findings:  Gallbladder:  The gallbladder is normal in appearance, without evidence for gallstones, gallbladder wall thickening or pericholecystic fluid.  No ultrasonographic Murphy's sign is elicited.  Common Bile Duct:  0.4 cm in diameter; within normal limits in  caliber.  Liver:  Mildly inhomogeneous in appearance, without significant fatty infiltration; this appears stable from 2010, and is likely within normal limits.  No focal lesions identified.  Limited Doppler evaluation demonstrates normal blood flow within the liver.  IVC:  Unremarkable in appearance.  Pancreas:  Not well characterized due to overlying bowel gas.  Spleen:  8.5 cm in length; within normal limits in size and echotexture.  Right kidney:  10.2 cm in length; normal in size, configuration and parenchymal echogenicity.  No evidence of mass or hydronephrosis.  Left kidney:  10.6 cm in length; normal in size, configuration and parenchymal echogenicity.  No evidence of mass or hydronephrosis.  Abdominal Aorta:  Normal in caliber; no aneurysm identified.  Not visualized proximally due to overlying bowel gas.  IMPRESSION: Unremarkable abdominal ultrasound.  Original Report Authenticated By: Tonia Ghent, M.D.   Ct Abdomen Pelvis W Contrast  09/17/2011  *RADIOLOGY REPORT*  Clinical Data: Mid abdominal pain, nausea, vomiting.  CT ABDOMEN AND PELVIS WITH CONTRAST  Technique:  Multidetector CT imaging of the abdomen and pelvis was performed following the standard protocol during bolus administration of intravenous contrast.  Contrast: OMNIPAQUE IOHEXOL 300 MG/ML IV SOLN  Comparison: Contemporaneous ultrasound  Findings: 3 mm nodule within the left lower lobe (series 4 image 8).  Mild aortic valve calcification.  Nonspecific 7 mm hypodensity within the right hepatic lobe. Unremarkable biliary system, spleen, pancreas, adrenal glands.  Symmetric renal enhancement.  No hydronephrosis or hydroureter.  Colonic diverticulosis.  No bowel obstruction.  There are a few loops of small bowel within the pelvis which demonstrate mucosal hyperenhancement and mild perienteric fluid.  No appreciable lymphadenopathy.  Thin-walled bladder.  Absent uterus.  No adnexal mass.  Normal caliber vasculature.  Scattered  atherosclerotic calcification of the aorta and its branches.  Multilevel degenerative changes of the imaged spine. No acute or aggressive appearing osseous lesion.  IMPRESSION: Mild stranding / fluid in the pelvis, may reflect enteritis of adjacent small bowel.  The etiology is nonspecific, with differential including ischemic, inflammatory, and infectious etiologies.  Colonic diverticulosis.  As the above described inflammation also abuts the colon, a less likely consideration would be diverticulitis.  There are no complicating features such as loculated fluid or free intraperitoneal air.  7 mm hypodensity within the right hepatic lobe is nonspecific and may reflect a non aggressive process. However, given the history of a primary malignancy, consider further characterization with an MRI follow-up.  3 mm pulmonary nodule within the left lower lobe is nonspecific. This also may be incidental however in the setting of a primary malignancy, a follow-up chest CT could be considered to evaluate the lungs in their entirety.  Original Report Authenticated By: Waneta Martins, M.D.    Medications: Scheduled Meds:   . aspirin EC  81 mg Oral Daily  . ciprofloxacin  400 mg Intravenous Q12H  . metoCLOPramide      . metoprolol succinate  25 mg Oral BID  . metronidazole  500 mg Intravenous Q8H  . ondansetron  4 mg Intravenous Once  . sodium chloride  1,000 mL Intravenous Once   Continuous  Infusions:   . sodium chloride 100 mL/hr at 09/18/11 0249  . sodium chloride     PRN Meds:.acetaminophen, albuterol, alum & mag hydroxide-simeth, guaiFENesin-dextromethorphan, HYDROcodone-acetaminophen, iohexol, ondansetron (ZOFRAN) IV, DISCONTD: metoCLOPramide (REGLAN) injection  Assessment/Plan:  1. Enteritis, likely infectious: Start patient empirically on IV Cipro and Flagyl. Antiemetics and pain medications when necessary. Start oral clear liquid diet as tolerated and advance as tolerated. Will obtain urine  analysis. 2. Dehydration: For IV fluids and monitor. 3. Anemia: Follow CBCs tomorrow 4. Hypertension: Reasonably controlled. 5. History of endometrial carcinoma 6. 7 mm hypodensity within the right hepatic lobe: However, given the history of a primary malignancy, consider further characterization with an MRI follow-up, as outpatient..  7. 3 mm pulmonary nodule within the left lower lobe:nonspecific. This also may be incidental however in the setting of a primary  malignancy, a follow-up chest CT could be considered to evaluate the lungs in their entirety, as outpatient.     [name] 09/18/2011, 2:17 PM

## 2011-09-19 LAB — BASIC METABOLIC PANEL
BUN: 8 mg/dL (ref 6–23)
CO2: 25 mEq/L (ref 19–32)
Calcium: 8.5 mg/dL (ref 8.4–10.5)
Chloride: 106 mEq/L (ref 96–112)
Creatinine, Ser: 0.73 mg/dL (ref 0.50–1.10)
GFR calc Af Amer: >90 mL/min (ref >90)
GFR calc non Af Amer: 86 mL/min — ABNORMAL LOW (ref >90)
Glucose, Bld: 100 mg/dL — ABNORMAL HIGH (ref 70–99)
Potassium: 3.3 mEq/L — ABNORMAL LOW (ref 3.5–5.1)
Sodium: 138 mEq/L (ref 135–145)

## 2011-09-19 LAB — CBC
HCT: 33.5 % — ABNORMAL LOW (ref 36.0–46.0)
Hemoglobin: 11.6 g/dL — ABNORMAL LOW (ref 12.0–15.0)
MCH: 30.9 pg (ref 26.0–34.0)
MCHC: 34.6 g/dL (ref 30.0–36.0)
MCV: 89.3 fL (ref 78.0–100.0)
Platelets: 155 10*3/uL (ref 150–400)
RBC: 3.75 MIL/uL — ABNORMAL LOW (ref 3.87–5.11)
RDW: 13.2 % (ref 11.5–15.5)
WBC: 6 10*3/uL (ref 4.0–10.5)

## 2011-09-19 LAB — DIFFERENTIAL
Basophils Absolute: 0 10*3/uL (ref 0.0–0.1)
Basophils Relative: 0 % (ref 0–1)
Eosinophils Absolute: 0.1 10*3/uL (ref 0.0–0.7)
Eosinophils Relative: 1 % (ref 0–5)
Lymphocytes Relative: 9 % — ABNORMAL LOW (ref 12–46)
Lymphs Abs: 0.5 10*3/uL — ABNORMAL LOW (ref 0.7–4.0)
Monocytes Absolute: 0.7 10*3/uL (ref 0.1–1.0)
Monocytes Relative: 12 % (ref 3–12)
Neutro Abs: 4.7 10*3/uL (ref 1.7–7.7)
Neutrophils Relative %: 79 % — ABNORMAL HIGH (ref 43–77)

## 2011-09-19 LAB — OCCULT BLOOD X 1 CARD TO LAB, STOOL: Fecal Occult Bld: NEGATIVE

## 2011-09-19 MED ORDER — POTASSIUM CHLORIDE CRYS ER 20 MEQ PO TBCR
40.0000 meq | EXTENDED_RELEASE_TABLET | Freq: Once | ORAL | Status: AC
  Administered 2011-09-19: 40 meq via ORAL
  Filled 2011-09-19: qty 2

## 2011-09-19 NOTE — Progress Notes (Signed)
Subjective: Overall patient says that she's feeling better. She has occasional dry heaves and nausea. She has tolerated small amount of clear liquids. Her abdominal pain is better and more in the epigastric region. She had a BM today which was initially loose followed by normal consistency.   Per patient Dr. Bonnye Fava tried a colonoscopy but was incomplete in 2010 but showed diverticulitis.  Objective: Blood pressure 144/82, pulse 69, temperature 98 F (36.7 C), temperature source Oral, resp. rate 18, height 5' 4.5" (1.638 m), weight 89.1 kg (196 lb 6.9 oz), SpO2 95.00%.  Intake/Output Summary (Last 24 hours) at 09/19/11 1726 Last data filed at 09/19/11 0703  Gross per 24 hour  Intake 1954.7 ml  Output   2200 ml  Net -245.3 ml   General exam: Patient is comfortable. Respiratory system: Clear. No increased work of breathing. Cardiovascular system: First and second heart sounds heard, regular. Gastrointestinal system: Abdomen is nondistended. Mild tenderness in the epigastric region. Abdomen otherwise soft. No rigidity guarding or rebound. Normal bowel sounds heard. Central nervous system: Alert and oriented. No focal neurological deficits Mucosa: Mildly dry.  Lab Results: Basic Metabolic Panel:  Basename 09/19/11 0537 09/18/11 0441  NA 138 139  K 3.3* 3.5  CL 106 106  CO2 25 26  GLUCOSE 100* 101*  BUN 8 11  CREATININE 0.73 0.78  CALCIUM 8.5 8.4  MG -- 2.1  PHOS -- 3.0   Liver Function Tests:  [facility] 09/18/11 0441 09/17/11 1925  AST 12 15  ALT 14 17  ALKPHOS 47 54  BILITOT 0.8 0.9  PROT 5.3* 6.1  ALBUMIN 3.1* 3.6    Basename 09/17/11 1925 09/17/11 0230  LIPASE 43 43  AMYLASE -- --   No results found for this basename: AMMONIA:2 in the last 72 hours CBC:  Basename 09/19/11 0537 09/18/11 0441 09/17/11 1925  WBC 6.0 7.3 --  NEUTROABS 4.7 -- 8.8*  HGB 11.6* 11.5* --  HCT 33.5* 35.1* --  MCV 89.3 90.9 --  PLT 155 159 --   Cardiac Enzymes: No results found  for this basename: CKTOTAL:3,CKMB:3,CKMBINDEX:3,TROPONINI:3 in the last 72 hours BNP: No results found for this basename: POCBNP:3 in the last 72 hours D-Dimer: No results found for this basename: DDIMER:2 in the last 72 hours CBG: No results found for this basename: GLUCAP:6 in the last 72 hours Hemoglobin A1C: No results found for this basename: HGBA1C in the last 72 hours Fasting Lipid Panel: No results found for this basename: CHOL,HDL,LDLCALC,TRIG,CHOLHDL,LDLDIRECT in the last 72 hours Thyroid Function Tests:  Basename 09/18/11 0441  TSH 2.356  T4TOTAL --  FREET4 --  T3FREE --  THYROIDAB --   Anemia Panel: No results found for this basename: VITAMINB12,FOLATE,FERRITIN,TIBC,IRON,RETICCTPCT in the last 72 hours Coagulation: No results found for this basename: LABPROT:2,INR:2 in the last 72 hours Urine Drug Screen:  Alcohol Level: No results found for this basename: ETH:2 in the last 72 hours Urinalysis:  Misc. Labs:   Micro Results: No results found for this or any previous visit (from the past 240 hour(s)).  Studies/Results: US Abdomen Complete  09/17/2011  *RADIOLOGY REPORT*  Clinical Data:  Right upper quadrant abdominal pain, nausea and vomiting.  ABDOMINAL ULTRASOUND COMPLETE  Comparison:  Abdominal ultrasound performed 04/11/2009  Findings:  Gallbladder:  The gallbladder is normal in appearance, without evidence for gallstones, gallbladder wall thickening or pericholecystic fluid.  No ultrasonographic Murphy's sign is elicited.  Common Bile Duct:  0.4 cm in diameter; within normal limits in caliber.  Liver:  Mildly inhomogeneous in appearance, without significant fatty infiltration; this appears stable from 2010, and is likely within normal limits.  No focal lesions identified.  Limited Doppler evaluation demonstrates normal blood flow within the liver.  IVC:  Unremarkable in appearance.  Pancreas:  Not well characterized due to overlying bowel gas.  Spleen:  8.5 cm in  length; within normal limits in size and echotexture.  Right kidney:  10.2 cm in length; normal in size, configuration and parenchymal echogenicity.  No evidence of mass or hydronephrosis.  Left kidney:  10.6 cm in length; normal in size, configuration and parenchymal echogenicity.  No evidence of mass or hydronephrosis.  Abdominal Aorta:  Normal in caliber; no aneurysm identified.  Not visualized proximally due to overlying bowel gas.  IMPRESSION: Unremarkable abdominal ultrasound.  Original Report Authenticated By: Tonia Ghent, M.D.   Ct Abdomen Pelvis W Contrast  09/17/2011  *RADIOLOGY REPORT*  Clinical Data: Mid abdominal pain, nausea, vomiting.  CT ABDOMEN AND PELVIS WITH CONTRAST  Technique:  Multidetector CT imaging of the abdomen and pelvis was performed following the standard protocol during bolus administration of intravenous contrast.  Contrast: OMNIPAQUE IOHEXOL 300 MG/ML IV SOLN  Comparison: Contemporaneous ultrasound  Findings: 3 mm nodule within the left lower lobe (series 4 image 8).  Mild aortic valve calcification.  Nonspecific 7 mm hypodensity within the right hepatic lobe. Unremarkable biliary system, spleen, pancreas, adrenal glands.  Symmetric renal enhancement.  No hydronephrosis or hydroureter.  Colonic diverticulosis.  No bowel obstruction.  There are a few loops of small bowel within the pelvis which demonstrate mucosal hyperenhancement and mild perienteric fluid.  No appreciable lymphadenopathy.  Thin-walled bladder.  Absent uterus.  No adnexal mass.  Normal caliber vasculature.  Scattered atherosclerotic calcification of the aorta and its branches.  Multilevel degenerative changes of the imaged spine. No acute or aggressive appearing osseous lesion.  IMPRESSION: Mild stranding / fluid in the pelvis, may reflect enteritis of adjacent small bowel.  The etiology is nonspecific, with differential including ischemic, inflammatory, and infectious etiologies.  Colonic diverticulosis.   As the above described inflammation also abuts the colon, a less likely consideration would be diverticulitis.  There are no complicating features such as loculated fluid or free intraperitoneal air.  7 mm hypodensity within the right hepatic lobe is nonspecific and may reflect a non aggressive process. However, given the history of a primary malignancy, consider further characterization with an MRI follow-up.  3 mm pulmonary nodule within the left lower lobe is nonspecific. This also may be incidental however in the setting of a primary malignancy, a follow-up chest CT could be considered to evaluate the lungs in their entirety.  Original Report Authenticated By: Waneta Martins, M.D.    Medications: Scheduled Meds:    . aspirin EC  81 mg Oral Daily  . ciprofloxacin  400 mg Intravenous Q12H  . metoprolol succinate  25 mg Oral BID  . metronidazole  500 mg Intravenous Q8H   Continuous Infusions:    . sodium chloride 75 mL/hr at 09/19/11 0410   PRN Meds:.acetaminophen, albuterol, alum & mag hydroxide-simeth, guaiFENesin-dextromethorphan, HYDROcodone-acetaminophen, ondansetron (ZOFRAN) IV  Assessment/Plan:  1. Enteritis, likely infectious: Improving. Continue IV Cipro and Flagyl. Antiemetics and pain medications when necessary. Advance diet as tolerated. 2. Dehydration: Improved. Reduce IV fluids 3. Hypokalemia: Replete and follow. 4. Anemia: Stable. 5. Hypertension: Reasonably controlled. 6. History of endometrial carcinoma 7. 7 mm hypodensity within the right hepatic lobe: However, given the history of a primary malignancy,  consider further characterization with an MRI follow-up, as outpatient..  8. 3 mm pulmonary nodule within the left lower lobe:nonspecific. This also may be incidental however in the setting of a primary  malignancy, a follow-up chest CT could be considered to evaluate the lungs in their entirety, as outpatient.  Disposition: The patient continues to improve then  possible discharge home tomorrow.     Damyn Weitzel 09/19/2011, 5:26 PM

## 2011-09-20 MED ORDER — CIPROFLOXACIN HCL 500 MG PO TABS: 500.0000 mg | ORAL_TABLET | Freq: Two times a day (BID) | ORAL | Status: AC

## 2011-09-20 MED ORDER — METRONIDAZOLE 500 MG PO TABS: 500.0000 mg | ORAL_TABLET | Freq: Three times a day (TID) | ORAL | Status: AC

## 2011-09-20 NOTE — Discharge Summary (Signed)
DISCHARGE SUMMARY  AUBRIONNA ISTRE  MR#: [id number]  DOB:12/11/1940  Date of Admission: 09/17/2011 Date of Discharge: 09/20/2011  Attending Physician:Kelis Plasse  Patient's PCP: Dr. Juline Patch  Consults:  none  Discharge Diagnoses: 1. Enteritis, likely infectious. 2. Dehydration 3. Hypokalemia 4. Anemia 5. Hypertension 6. History of endometrial carcinoma. 7. 7 mm hypodensity within the right hepatic lobe: Outpatient evaluation and follow up.  8. 3 mm pulmonary nodule within the left lower lobe:nonspecific. Outpatient evaluation and followup.    Current Discharge Medication List    START taking these medications   Details  ciprofloxacin (CIPRO) 500 MG tablet Take 1 tablet (500 mg total) by mouth 2 (two) times daily. Qty: 10 tablet, Refills: 0    metroNIDAZOLE (FLAGYL) 500 MG tablet Take 1 tablet (500 mg total) by mouth 3 (three) times daily. Qty: 15 tablet, Refills: 0      CONTINUE these medications which have NOT CHANGED   Details  aspirin EC 81 MG tablet Take 81 mg by mouth daily.      b complex vitamins tablet Take 1 tablet by mouth daily.      calcium carbonate (OS-CAL - DOSED IN MG OF ELEMENTAL CALCIUM) 1250 MG tablet Take 1 tablet by mouth daily.      Cholecalciferol (VITAMIN D) 2000 UNITS tablet Take 2,000 Units by mouth daily.      metoprolol succinate (TOPROL-XL) 25 MG 24 hr tablet Take 25 mg by mouth 2 (two) times daily.      Multiple Vitamins-Minerals (MULTIVITAMINS THER. W/MINERALS) TABS Take 1 tablet by mouth daily.      ondansetron (ZOFRAN ODT) 8 MG disintegrating tablet Take 1 tablet (8 mg total) by mouth every 8 (eight) hours as needed for nausea. Qty: 10 tablet, Refills: 0          Hospital Course: Patient is a 70 year old female with history of endometrial carcinoma, hypertension, diverticulosis of the colon who came with history of intractable nausea, vomiting and abdominal pain since Thursday. She denies any sickly contacts, recent  travel or eating anything unusual. The first day she had almost 20 episodes of vomiting. There was no coffee grounds or blood. Her abdominal pain was in the mid-upper abdomen and suprapubic region. No diarrhea. She was am seeing in the emergency department and prescribed Zofran and discharged home. However her symptoms persisted and she returned to the emergency room. Her CT scan of the abdomen was suggestive of enteritis. She was admitted to the hospital and her bowels were briefly rested. She was given IV fluids and started empirically on IV Cipro and Flagyl. With these measures she improved. Her diet was started and has been gradually advanced. She indicates that she is feeling significantly better. Her nausea and dry heaves have resolved. She has tolerated soft diet. She has minimal intermittent epigastric abdominal pain. She had one watery BM today.   Recommend outpatient evaluation and followup of her abnormal CT results including the 7 mm hypodensity within the right hepatic lobe and the 3 mm pulmonary nodule within the left lower lobe, as deemed necessary.   Day of Discharge BP 130/83  Pulse 61  Temp(Src) 98.6 F (37 C) (Oral)  Resp 20  Ht 5' 4.5" (1.638 m)  Wt 89.1 kg (196 lb 6.9 oz)  BMI 33.20 kg/m2  SpO2 97%  Physical Exam: General exam: Comfortable Respiratory system: Clear Cardiovascular system: First and second heart sounds heard, regular. Gastrointestinal system: Abdomen is nondistended, soft and normal bowel sounds heard. Central nervous system: Alert  and oriented. No focal neurological deficits.  Laboratory data: 1. BMP yesterday was significant for potassium of 3.3 otherwise unremarkable. 2. LFTs significant for albumin 3.1 and total protein 5.3 3. CBCs significant for hemoglobin 11.6 and MCV 89.3. 4. TSH normal at 2.356. 5. Stool was negative for occult blood. 6. Urine analysis was not suggestive of urinary tract infection. 7. CT. of the abdomen and pelvis with  contrast:  IMPRESSION:  Mild stranding / fluid in the pelvis, may reflect enteritis of  adjacent small bowel. The etiology is nonspecific, with  differential including ischemic, inflammatory, and infectious  etiologies.  Colonic diverticulosis. As the above described inflammation also  abuts the colon, a less likely consideration would be  diverticulitis. There are no complicating features such as  loculated fluid or free intraperitoneal air.  7 mm hypodensity within the right hepatic lobe is nonspecific and  may reflect a non aggressive process. However, given the history of  a primary malignancy, consider further characterization with an MRI  follow-up.  3 mm pulmonary nodule within the left lower lobe is nonspecific.  This also may be incidental however in the setting of a primary  malignancy, a follow-up chest CT could be considered to evaluate  the lungs in their entirety.  8. ultrasound of the abdomen: Unremarkable  Disposition: Patient is discharged home in stable condition.  Follow-up Appts: Discharge Orders    Future Appointments: Provider: Department: Dept Phone: Center:   10/06/2011 4:30 PM Billie Lade, MD [facility] [phone] None     Future Orders Please Complete By Expires   Diet - low sodium heart healthy      Increase activity slowly      No wound care      Call MD for:  temperature >100.4      Call MD for:  persistant nausea and vomiting      Call MD for:  severe uncontrolled pain         Follow-up with Dr. Juline Patch in 7-10 days from hospital discharge.   Tests Needing Follow-up: Abnormal CT of the chest.  Signed: Antonieta Slaven 09/20/2011, 3:37 PM

## 2011-09-20 NOTE — Progress Notes (Signed)
Spoke with patient at bedside. States she does not need any HH services, independent and able to manage self. Has PCP for f/u and she knows to make appt for hospital f/u. No further d/c needs identified, patient appreciative of visit.

## 2011-09-21 LAB — FECAL LACTOFERRIN, QUANT: Fecal Lactoferrin: POSITIVE

## 2011-10-05 ENCOUNTER — Encounter: Payer: Self-pay | Admitting: *Deleted

## 2011-10-05 DIAGNOSIS — C801 Malignant (primary) neoplasm, unspecified: Secondary | ICD-10-CM | POA: Insufficient documentation

## 2011-10-06 ENCOUNTER — Ambulatory Visit
Admission: RE | Admit: 2011-10-06 | Discharge: 2011-10-06 | Disposition: A | Discharge status: Home / Self Care | Payer: Medicare Other | Source: Ambulatory Visit | Attending: Radiation Oncology | Admitting: Radiation Oncology

## 2011-10-06 ENCOUNTER — Encounter: Payer: Self-pay | Admitting: Radiation Oncology

## 2011-10-06 ENCOUNTER — Other Ambulatory Visit (HOSPITAL_COMMUNITY)
Admission: RE | Admit: 2011-10-06 | Discharge: 2011-10-06 | Disposition: A | Discharge status: Home / Self Care | Payer: Medicare Other | Source: Ambulatory Visit | Attending: Radiation Oncology | Admitting: Radiation Oncology

## 2011-10-06 DIAGNOSIS — Z124 Encounter for screening for malignant neoplasm of cervix: Secondary | ICD-10-CM | POA: Insufficient documentation

## 2011-10-06 DIAGNOSIS — C549 Malignant neoplasm of corpus uteri, unspecified: Secondary | ICD-10-CM

## 2011-10-06 DIAGNOSIS — C801 Malignant (primary) neoplasm, unspecified: Secondary | ICD-10-CM

## 2011-10-06 NOTE — Progress Notes (Signed)
F/u endometrial ca, when in hospital weekend before thanksgiving, nausea/vomiting, inflamed small intestine, antibiotics, resolved, no bl;eeding, no discharge, diarrhea perioidically,takes imodium prn 4:40 PM

## 2011-10-06 NOTE — Progress Notes (Signed)
Pap smear obtained by dr. Delphia Grates tolerated well, 1 year f/u, appt card given to pt, reminded pt to use her dilator,pt stated she would,sent specimen to lab 5:01 PM

## 2011-10-07 NOTE — Progress Notes (Signed)
CC:   Paola A. Duard Brady, MD W. Varney Baas, M.D. Juline Patch, M.D.  DIAGNOSIS:  Endometrial cancer.  INTERVAL SINCE RADIATION THERAPY:  3 years and 3 months  NARRATIVE:  [name] comes in today for routine follow-up.  She clinically seems to be doing well at this time.  Interval history is significant for the patient being admitted for what the patient termed infection within the small bowel.  She was treated with antibiotics which resolved this situation.  The patient did present with nausea and emesis.  She did had some diarrhea while on antibiotics.  The patient denies any further problems with this issue.  The patient denies any pelvic pain, vaginal bleeding, rectal bleeding.  She has intermittent diarrhea related to particular foods.  The patient has been somewhat inconsistent in using her vaginal dilator, and I have discussed the importance of this issue.  The patient denies any flank pain.  PHYSICAL EXAMINATION:  Vital Signs:  On examination, the patient's weight is 194 pounds, which is down approximately 8 pounds since her weighing last year.  The patient has been trying to lose some weight. Blood pressure is 128/81, pulse is 105, temperature is 98.2. Examination of the neck and supraclavicular region reveals no evidence of adenopathy.  The axillary areas are free of adenopathy.  Examination of the lungs reveals them to be clear.  The heart has a regular rhythm and rate.  Examination of the abdomen reveals it to be soft and nontender with normal bowel sounds.  There is no obvious hepatosplenomegaly.  The inguinal areas are free of adenopathy.  On pelvic examination, the external genitalia are unremarkable.  A speculum exam was performed.  The vaginal vault was somewhat foreshortened, but with manipulation adhesions were easily broken without any significant bleeding.  The vaginal cuff was somewhat atrophic with some radiation changes.  A Pap smear was obtained of the  proximal vagina.  On bimanual and rectovaginal examination, there are no pelvic masses appreciated.  IMPRESSION AND PLAN:  Clinically no evidence of disease, Pap smear pending.  The patient will see Dr. Jennette Kettle and Dr. Duard Brady in the spring and summer of next year.  Routine follow-up in Radiation Oncology in 1 year.    ______________________________ Billie Lade, Ph.D., M.D. JDK/MEDQ  D:  10/06/2011  T:  10/07/2011  Job:  [id number]

## 2011-10-12 ENCOUNTER — Telehealth: Payer: Self-pay | Admitting: *Deleted

## 2011-10-12 NOTE — Telephone Encounter (Signed)
Pt home called, per spouse pt at work, called pt work and informed her of negative pap, pt thanked this Char[employer] fundraiser for the call 9:46 AM

## 2012-04-20 ENCOUNTER — Ambulatory Visit: Payer: Medicare Other | Attending: Gynecologic Oncology | Admitting: Gynecologic Oncology

## 2012-04-20 ENCOUNTER — Other Ambulatory Visit (HOSPITAL_COMMUNITY)
Admission: RE | Admit: 2012-04-20 | Discharge: 2012-04-20 | Disposition: A | Discharge status: Home / Self Care | Payer: Medicare Other | Source: Ambulatory Visit | Attending: Gynecologic Oncology | Admitting: Gynecologic Oncology

## 2012-04-20 ENCOUNTER — Encounter: Payer: Self-pay | Admitting: Gynecologic Oncology

## 2012-04-20 VITALS — BP 110/66 | HR 60 | Temp 98.6°F | Resp 14 | Ht 65.0 in | Wt 200.6 lb

## 2012-04-20 DIAGNOSIS — Z9079 Acquired absence of other genital organ(s): Secondary | ICD-10-CM | POA: Insufficient documentation

## 2012-04-20 DIAGNOSIS — I10 Essential (primary) hypertension: Secondary | ICD-10-CM | POA: Insufficient documentation

## 2012-04-20 DIAGNOSIS — C549 Malignant neoplasm of corpus uteri, unspecified: Secondary | ICD-10-CM | POA: Insufficient documentation

## 2012-04-20 DIAGNOSIS — Z803 Family history of malignant neoplasm of breast: Secondary | ICD-10-CM | POA: Insufficient documentation

## 2012-04-20 DIAGNOSIS — Z124 Encounter for screening for malignant neoplasm of cervix: Secondary | ICD-10-CM | POA: Insufficient documentation

## 2012-04-20 DIAGNOSIS — R197 Diarrhea, unspecified: Secondary | ICD-10-CM | POA: Insufficient documentation

## 2012-04-20 DIAGNOSIS — Z923 Personal history of irradiation: Secondary | ICD-10-CM | POA: Insufficient documentation

## 2012-04-20 DIAGNOSIS — Z90710 Acquired absence of both cervix and uterus: Secondary | ICD-10-CM | POA: Insufficient documentation

## 2012-04-20 DIAGNOSIS — Z79899 Other long term (current) drug therapy: Secondary | ICD-10-CM | POA: Insufficient documentation

## 2012-04-20 DIAGNOSIS — C541 Malignant neoplasm of endometrium: Secondary | ICD-10-CM

## 2012-04-20 DIAGNOSIS — Z806 Family history of leukemia: Secondary | ICD-10-CM | POA: Insufficient documentation

## 2012-04-20 NOTE — Addendum Note (Signed)
Addended by: Randel Pigg on: 04/20/2012 03:31 PM   Modules accepted: Orders

## 2012-04-20 NOTE — Patient Instructions (Signed)
Follow up with Dr. Roselind Messier in 6 months.

## 2012-04-20 NOTE — Progress Notes (Signed)
Consult Note: Gyn-Onc  [name] 71 y.o. female  CC:  Chief Complaint  Patient presents with  . Endo ca    Follow up    HPI: [name] is a very pleasant 71 year old with a stage IA, grade 1 endometrioid adenocarcinoma who underwent laparoscopic hysterectomy and BSO in April 2009 for complex hyperplasia with atypia. Frozen section  revealed no evidence of myometrial invasion, therefore lymphadenectomy was not performed. However, on final pathology, there was diffuse lymphovascular space involvement including to the deep levels of the  myometrium and the cervix. Washings were negative. There was a floater, small area of atypical endometrial type glands of the right fallopian tube, again consistent with a floater. After extensive discussion, she opted for pelvic and vaginal cuff brachytherapy which she completed. I last saw her in May 2012 at which time her exam was  unremarkable as was her Pap smear. She was seen by Dr. Roselind Messier in December 2012 with a negative exam. She comes in today for followup.   REVIEW OF SYSTEMS: She does continue to have diarrhea which lasted a couple of days at a time. There is no particular triggers for the diarrhea. It does get better with Imodium, but takes 4-5 Imodium to make this better. There is no blood in her stool. Overall she thinks frequency the diarrhea has diminished. Her incontinence is the same as a combination of both stress incontinence, she leak a little bit when she laughs and sneezes as well as urge incontinence. There is no blood in the stool or vaginal bleeding. She otherwise has 10-point negative review of systems. She denies any chest pain, short of breath, nausea, vomiting, fever, chills, headaches, visual changes, unintentional weight loss, weight gain, early satiety, abdominal or pelvic bloating.   FAMILY HISTORY: Her sister who is 9 years older than her has terminal breast cancer. There thinking there is no additional therapy that could offer  and she was placed in a nursing home. She is apparently doing so much better that she may be discharged from the nursing home and they may reconsider chemotherapy. This particular sister's son is 62 and was diagnosed with abdominal lymphoma and workup is ensuing.  HEALTH MAINTENANCE: She is up-to-date on her mammogram. She had a colonoscopy in 2010, does not need another one for 10 years.   Interval History:  She was last seen by Dr. Roselind Messier in December. She also had a followup evaluation and exam with Dr. Jennette Kettle. Her mammogram was negative as was her breast exam.   Current Meds:  Outpatient Encounter Prescriptions as of 04/20/2012  Medication Sig Dispense Refill  . amoxicillin (AMOXIL) 500 MG tablet Take 500 mg by mouth 3 (three) times daily. Started on 6/11      . aspirin EC 81 MG tablet Take 81 mg by mouth daily.        Marland Kitchen b complex vitamins tablet Take 1 tablet by mouth daily.        . calcium carbonate (OS-CAL - DOSED IN MG OF ELEMENTAL CALCIUM) 1250 MG tablet Take 1 tablet by mouth daily.        . Cholecalciferol (VITAMIN D) 2000 UNITS tablet Take 2,000 Units by mouth daily.        . fish oil-omega-3 fatty acids 1000 MG capsule Take 1 g by mouth daily.        Marland Kitchen loperamide (IMODIUM) 2 MG capsule Take 2 mg by mouth as needed. Prn diarrhea, otc       .  metoprolol succinate (TOPROL-XL) 25 MG 24 hr tablet Take 25 mg by mouth 2 (two) times daily.        . Multiple Vitamins-Minerals (MULTIVITAMINS THER. W/MINERALS) TABS Take 1 tablet by mouth daily.          Allergy: No Known Allergies  Social Hx:   History   Social History  . Marital Status: Married    Spouse Name: N/A    Number of Children: N/A  . Years of Education: N/A   Occupational History  . Not on file.   Social History Main Topics  . Smoking status: Never Smoker   . Smokeless tobacco: Not on file  . Alcohol Use: Yes     less then 2 a month  . Drug Use: No  . Sexually Active: No   Other Topics Concern  . Not on file    Social History Narrative  . No narrative on file    Past Surgical Hx:  Past Surgical History  Procedure Date  . Abdominal hysterectomy   . Tear duct probing   . Appendectomy     1967  . Ectopic pregnancy surgery 1967  . Orthopedic surgery     Orthoscopic Knee Surgery  . Tooth extraction     bone graft    Past Medical Hx:  Past Medical History  Diagnosis Date  . Hypertension   . Diverticulosis of colon   . Cancer     1A grade endometrial ca  . Hx of radiation therapy 04/24/08-05/29/08& 8/12/,8/19,&06/27/08    external beam  through 7/09 ,then intracavity in 06/2008    Family Hx:  Family History  Problem Relation Age of Onset  . Diabetes type II Mother   . Cancer Mother   . Breast cancer Mother   . Leukemia Father   . Diabetes type II Sister     Vitals:  Blood pressure 110/66, pulse 60, temperature 98.6 F (37 C), temperature source Oral, resp. rate 14, height 5\' 5"  (1.651 m), weight 200 lb 9.6 oz (90.992 kg).  Physical Exam: GENERAL: Well-nourished, well-developed female, in no acute distress.   NECK: Supple. There is no lymphadenopathy, no thyromegaly.   LUNGS: Clear to auscultation bilaterally.   CARDIOVASCULAR: Regular rate and rhythm.   ABDOMEN: Soft, nontender, nondistended. There are no palpable masses or hepatosplenomegaly. Groins are negative for adenopathy.   EXTREMITIES: There is 1+ to 2+ nonpitting edema equal bilaterally.   PELVIC: External genitalia is within normal limits though atrophic. The vaginal cuff is markedly atrophic. There are no gross visible lesions. There are postradiation changes. ThinPrep Pap was submitted without difficulty. Bimanual examination reveals no masses or nodularity. Rectal confirms.   ASSESSMENT: A 71 year old with a clinical stage IA, grade 1 endometrioid adenocarcinoma who underwent postoperative radiation therapy secondary to diffuse lymphovascular space involvement of her grade 1 tumor throughout the myometrium. She  clinically has no evidence of recurrent disease about 4 years after the diagnosis.   PLAN:  1. We will follow up the results for Pap smear from today.  2. She will see Dr. Roselind Messier in 6 months, will follow up with Dr. Jennette Kettle for her routine GYN needs and MMG. 3. She will return to see me in 1 year. We will contact her with the  results of her Pap smear. After that visit with [place] in one year she will be > than 5 years from her diagnosis and will follow up with Dr. Jennette Kettle for her annual gynecologic care.    Dawn Ciaramitaro A.,  MD 04/20/2012, 2:44 PM

## 2012-04-24 ENCOUNTER — Telehealth: Payer: Self-pay | Admitting: Gynecologic Oncology

## 2012-04-24 NOTE — Telephone Encounter (Signed)
Pt notified about pap results: negative.  No questions or concerns voiced. 

## 2012-10-09 ENCOUNTER — Ambulatory Visit: Payer: Medicare PPO | Admitting: Radiation Oncology

## 2012-10-12 ENCOUNTER — Ambulatory Visit: Payer: Medicare Other | Admitting: Radiation Oncology

## 2012-10-19 ENCOUNTER — Encounter: Payer: Self-pay | Admitting: Radiation Oncology

## 2012-10-19 ENCOUNTER — Ambulatory Visit
Admission: RE | Admit: 2012-10-19 | Discharge: 2012-10-19 | Disposition: A | Discharge status: Home / Self Care | Payer: Medicare Other | Source: Ambulatory Visit | Attending: Radiation Oncology | Admitting: Radiation Oncology

## 2012-10-19 VITALS — BP 109/65 | HR 65 | Temp 98.4°F | Wt 191.3 lb

## 2012-10-19 DIAGNOSIS — C549 Malignant neoplasm of corpus uteri, unspecified: Secondary | ICD-10-CM

## 2012-10-19 NOTE — Progress Notes (Signed)
Patient here for routine follow up post completion of pelvic radiation in August 2009.Patient doing well except for intermittent diarrhea 3 to 4 times weekly.Denies pain,vaginal discharge or urinary problems.

## 2012-10-19 NOTE — Progress Notes (Signed)
Radiation Oncology         [phone] ________________________________  Name: [name] MRN: [id number]  Date: 10/19/2012  DOB: [birth year]  Follow-Up Visit Note  CC: Juline Patch, MD  Thompson Caul., MD  Diagnosis:   Endometrioid adenocarcinoma with vascular space involvement, pT1a  Interval Since Last Radiation:  4 years and 3 months. The patient completed external beam followed by intracavitary brachytherapy treatments.  Narrative:  The patient returns today for routine follow-up.  She is doing well except for persistent problems with diarrhea. She says this occurs approximately every 2-3 days. This however is controlled well with 2-4 Imodium. Patient denies any rectal bleeding. She denies any vaginal bleeding or urination difficulties.                              ALLERGIES:   has no known allergies.  Meds: Current Outpatient Prescriptions  Medication Sig Dispense Refill  . b complex vitamins tablet Take 1 tablet by mouth daily.        . calcium carbonate (OS-CAL - DOSED IN MG OF ELEMENTAL CALCIUM) 1250 MG tablet Take 1 tablet by mouth daily.        . Cholecalciferol (VITAMIN D) 2000 UNITS tablet Take 2,000 Units by mouth daily.        . fish oil-omega-3 fatty acids 1000 MG capsule Take 1 g by mouth daily.        Marland Kitchen loperamide (IMODIUM) 2 MG capsule Take 2 mg by mouth as needed. Prn diarrhea, otc       . metoprolol succinate (TOPROL-XL) 25 MG 24 hr tablet Take 25 mg by mouth 2 (two) times daily.        . Multiple Vitamins-Minerals (MULTIVITAMINS THER. W/MINERALS) TABS Take 1 tablet by mouth daily.        Marland Kitchen aspirin EC 81 MG tablet Take 81 mg by mouth daily.        . VOLTAREN 1 % GEL         Physical Findings: The patient is in no acute distress. Patient is alert and oriented.  weight is 191 lb 4.8 oz (86.773 kg). Her temperature is 98.4 F (36.9 C). Her blood pressure is 109/65 and her pulse is 65. Marland Kitchen  No palpable supraclavicular or axillary adenopathy. The lungs are clear  to auscultation. The heart has a regular rhythm and rate. The abdomen is soft and nontender with normal bowel sounds. There is no inguinal adenopathy appreciated. Pelvic examination the external genitalia are unremarkable. A speculum exam is performed. There are some mild radiation changes noted in the proximal vagina. A Pap smear was obtained of the proximal vagina. There are no mucosal lesions noted in the vaginal vault. On bimanual and rectovaginal examination there no pelvic masses appreciated.  Lab Findings: Lab Results  Component Value Date   WBC 6.0 09/19/2011   HGB 11.6* 09/19/2011   HCT 33.5* 09/19/2011   MCV 89.3 09/19/2011   PLT 155 09/19/2011    @LASTCHEM @  Radiographic Findings: No results found.  Impression:  No evidence of recurrence on clinical exam today, Pap smear pending. As above patient has persistent chronic problems with diarrhea. I recommended consideration for a bulking agent but the patient is comfortable with her overall situation and does not wish to consider a fiber supplement  Plan:  When necessary followup. Patient will see Dr. Duard Brady in the 6 months for her final 5 year checkup. Patient will then  be seen by Dr. Jennette Kettle on yearly basis.  _____________________________________  -----------------------------------  Billie Lade, PhD, MD

## 2013-03-11 IMAGING — US US ABDOMEN COMPLETE
1 series · 13 of 25 positions shown · non-contrast
Comparison: Abdominal ultrasound performed 04/11/2009

CLINICAL DATA: Right upper quadrant abdominal pain, nausea and
vomiting.

ABDOMINAL ULTRASOUND COMPLETE

[Series 1: us abdomen complete · 0.32mm/px · 13 of 68 slices shown]
[im 1/68]
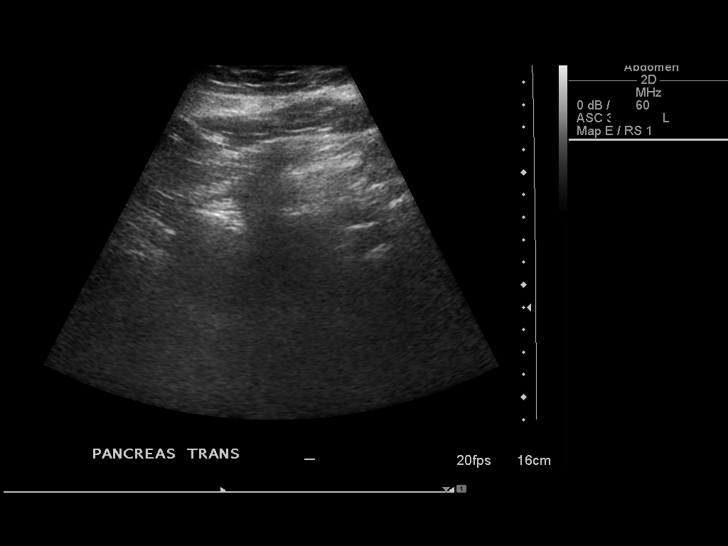
[im 6/68]
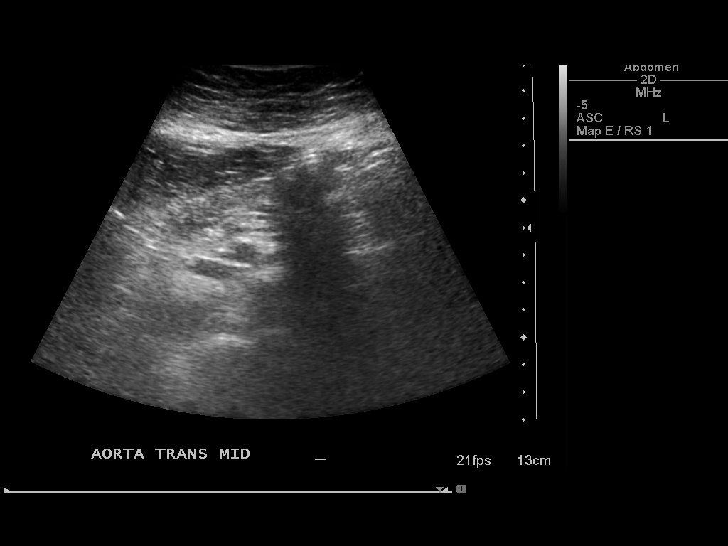
[im 12/68]
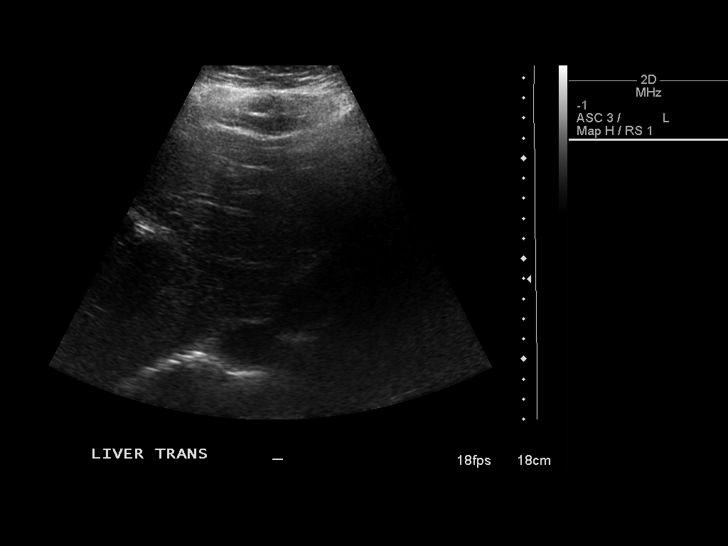
[im 17/68]
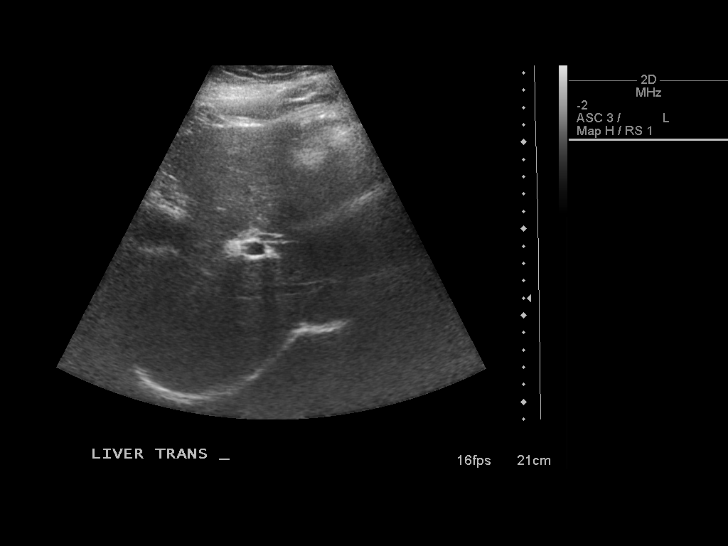
[im 23/68]
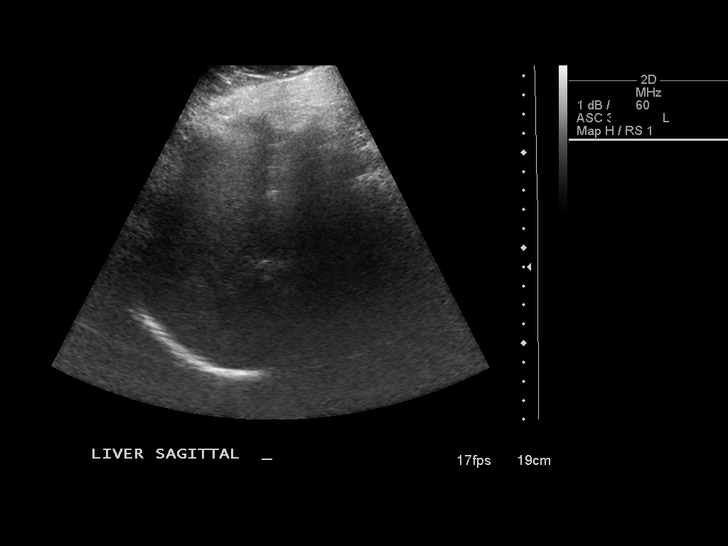
[im 28/68]
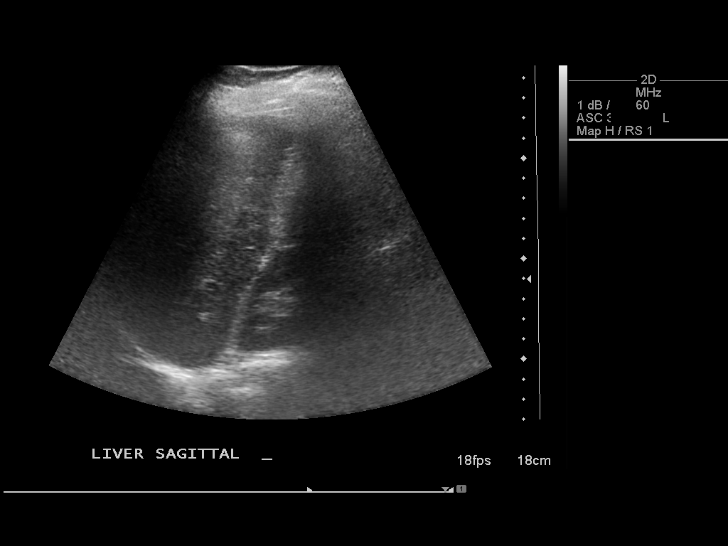
[im 34/68]
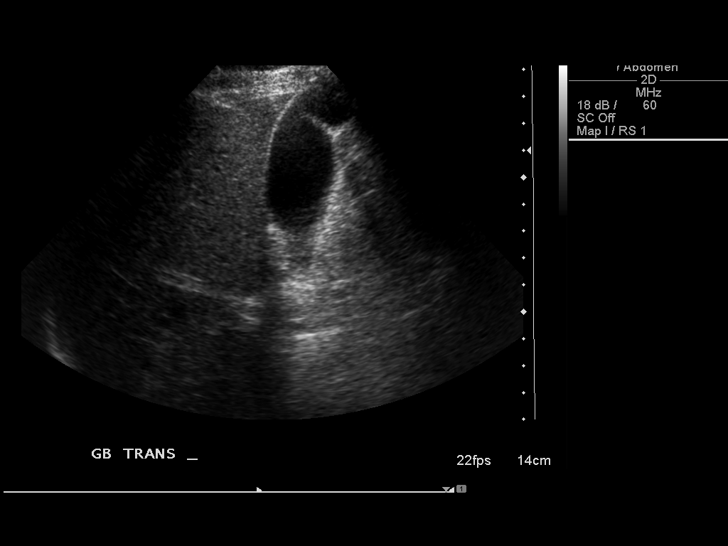
[im 40/68]
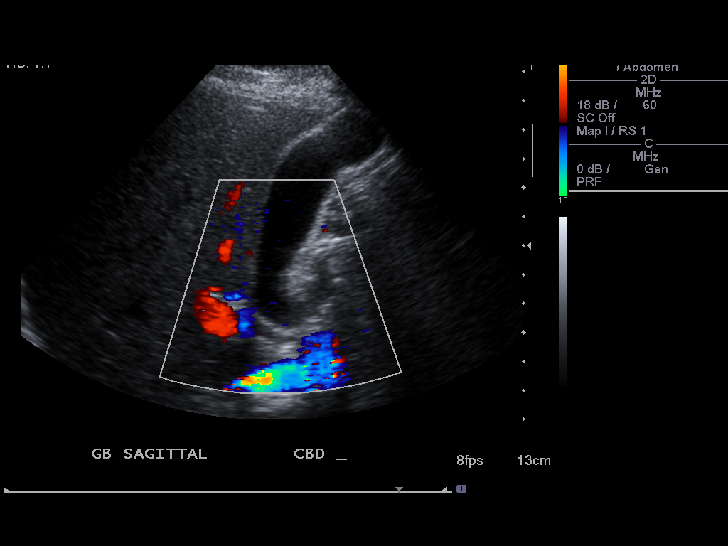
[im 45/68]
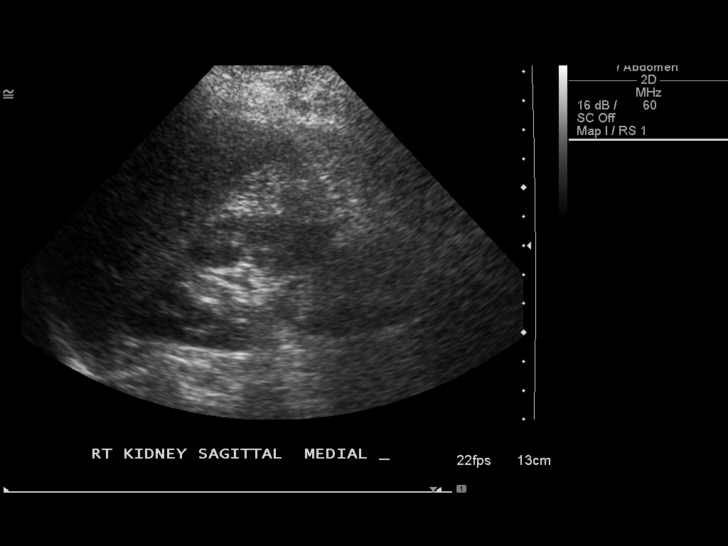
[im 51/68]
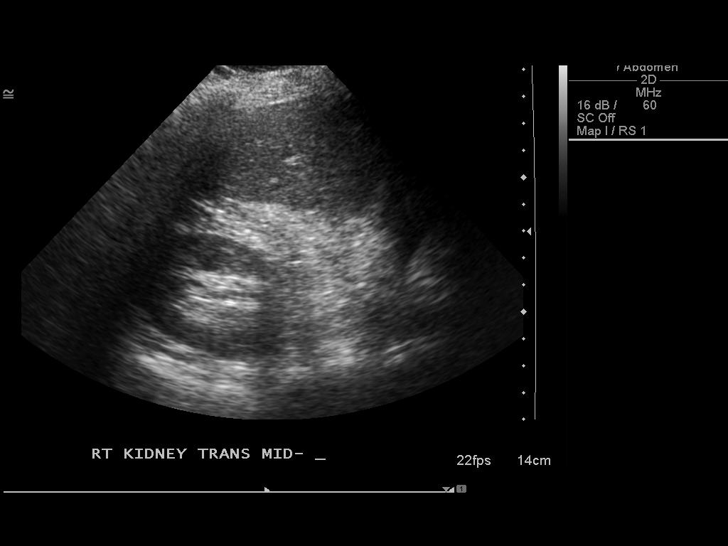
[im 56/68]
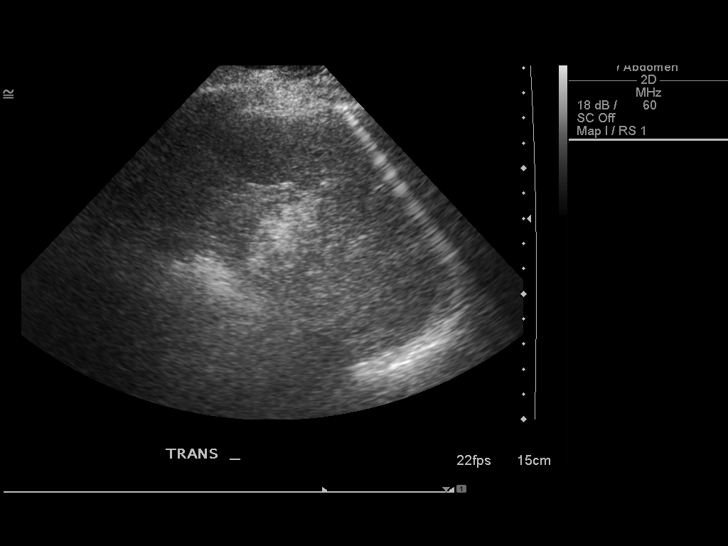
[im 62/68]
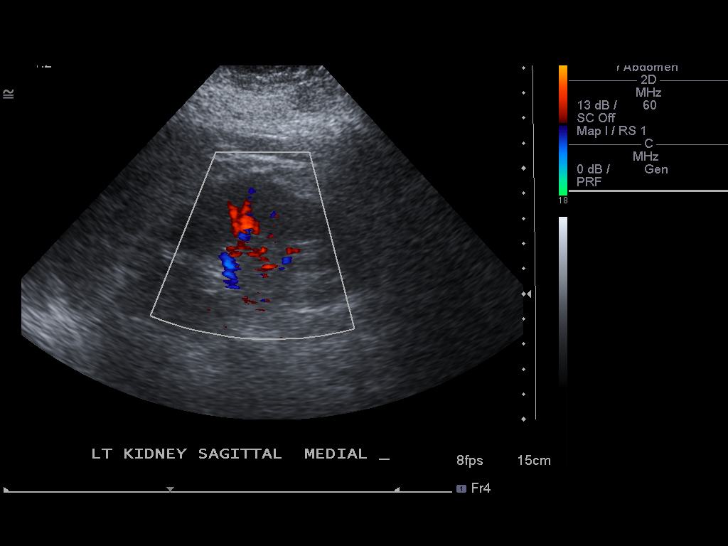
[im 68/68]
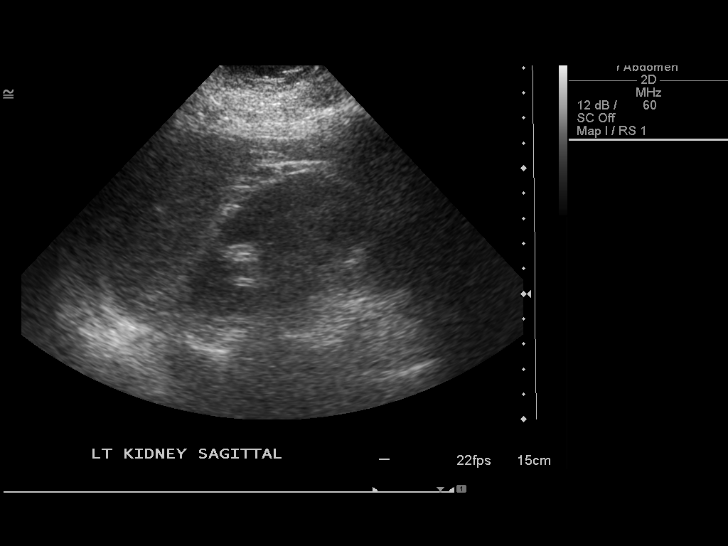

[13 of 25 positions shown; findings below may reference images not displayed]

FINDINGS: Gallbladder:  The gallbladder is normal in appearance, without
evidence for gallstones, gallbladder wall thickening or
pericholecystic fluid.  No ultrasonographic Murphy's sign is
elicited.

Common Bile Duct:  0.4 cm in diameter; within normal limits in
caliber.

Liver:  Mildly inhomogeneous in appearance, without significant
fatty infiltration; this appears stable from 0515, and is likely
within normal limits.  No focal lesions identified.  Limited
Doppler evaluation demonstrates normal blood flow within the liver.

IVC:  Unremarkable in appearance.

Pancreas:  Not well characterized due to overlying bowel gas.

Spleen:  8.5 cm in length; within normal limits in size and
echotexture.

Right kidney:  10.2 cm in length; normal in size, configuration and
parenchymal echogenicity.  No evidence of mass or hydronephrosis.

Left kidney:  10.6 cm in length; normal in size, configuration and
parenchymal echogenicity.  No evidence of mass or hydronephrosis.

Abdominal Aorta:  Normal in caliber; no aneurysm identified.  Not
visualized proximally due to overlying bowel gas.
IMPRESSION: Unremarkable abdominal ultrasound.

## 2013-05-10 ENCOUNTER — Other Ambulatory Visit (HOSPITAL_COMMUNITY)
Admission: RE | Admit: 2013-05-10 | Discharge: 2013-05-10 | Disposition: A | Discharge status: Home / Self Care | Payer: Medicare Other | Source: Ambulatory Visit | Attending: Gynecologic Oncology | Admitting: Gynecologic Oncology

## 2013-05-10 ENCOUNTER — Ambulatory Visit: Payer: Medicare FFS | Attending: Gynecologic Oncology | Admitting: Gynecologic Oncology

## 2013-05-10 ENCOUNTER — Encounter: Payer: Self-pay | Admitting: Gynecologic Oncology

## 2013-05-10 VITALS — BP 110/76 | HR 64 | Temp 98.1°F | Resp 16 | Ht 65.0 in | Wt 199.0 lb

## 2013-05-10 DIAGNOSIS — Z7982 Long term (current) use of aspirin: Secondary | ICD-10-CM | POA: Insufficient documentation

## 2013-05-10 DIAGNOSIS — C549 Malignant neoplasm of corpus uteri, unspecified: Secondary | ICD-10-CM | POA: Insufficient documentation

## 2013-05-10 DIAGNOSIS — N3946 Mixed incontinence: Secondary | ICD-10-CM | POA: Insufficient documentation

## 2013-05-10 DIAGNOSIS — C541 Malignant neoplasm of endometrium: Secondary | ICD-10-CM

## 2013-05-10 DIAGNOSIS — Z124 Encounter for screening for malignant neoplasm of cervix: Secondary | ICD-10-CM | POA: Insufficient documentation

## 2013-05-10 DIAGNOSIS — Z803 Family history of malignant neoplasm of breast: Secondary | ICD-10-CM | POA: Insufficient documentation

## 2013-05-10 DIAGNOSIS — R197 Diarrhea, unspecified: Secondary | ICD-10-CM | POA: Insufficient documentation

## 2013-05-10 DIAGNOSIS — Z79899 Other long term (current) drug therapy: Secondary | ICD-10-CM | POA: Insufficient documentation

## 2013-05-10 NOTE — Progress Notes (Signed)
Consult Note: Gyn-Onc  [name] 72 y.o. female  CC:  Chief Complaint  Patient presents with  . Endometrial cancer    Follow up    HPI: [name] is a very pleasant 72 year old with a stage IA, grade 1 endometrioid adenocarcinoma who underwent laparoscopic hysterectomy and BSO in April 2009 for complex hyperplasia with atypia. Frozen section  revealed no evidence of myometrial invasion, therefore lymphadenectomy was not performed. However, on final pathology, there was diffuse lymphovascular space involvement including to the deep levels of the  myometrium and the cervix. Washings were negative. There was a floater, small area of atypical endometrial type glands of the right fallopian tube, again consistent with a floater. After extensive discussion, she opted for pelvic and vaginal cuff brachytherapy which she completed. I last saw [name] in June 2013 at which time [name] exam was unremarkable as was [name] Pap smear. She was seen by Dr. Roselind Messier in December 2013 with a negative exam at [name] Pap smear was insufficient for diagnosis. She comes in today for followup.   REVIEW OF SYSTEMS: She does continue to have diarrhea which lasted a couple of days at a time. There is no particular triggers for the diarrhea. It does get better with Imodium, but takes 4-5 Imodium to make this better. There is no blood in [name] stool. [name] incontinence is the same as a combination of both stress incontinence, she leak a little bit when she laughs and sneezes as well as urge incontinence. There is no blood in the stool or vaginal bleeding. She otherwise has 10-point negative review of systems. She denies any chest pain, short of breath, nausea, vomiting, fever, chills, headaches, visual changes, unintentional weight loss, weight gain, early satiety, abdominal or pelvic bloating. It is [name] birthday today and they celebrated by going to [employer].  FAMILY HISTORY: [name] sister is off all chemotherapy at this time. She continues to  suffer from breast cancer and continues in [name] nursing home. She is overall doing fairly well.  HEALTH MAINTENANCE: She is up-to-date on [name] mammogram. She had a colonoscopy in 2010, does not need another one for 10 years.   Interval History:  She also had a followup evaluation and exam with Dr. Jennette Kettle. [name] mammogram was negative as was [name] breast exam. This visit was in April 2014.   Current Meds:  Outpatient Encounter Prescriptions as of 05/10/2013  Medication Sig Dispense Refill  . aspirin EC 81 MG tablet Take 81 mg by mouth daily.        Marland Kitchen b complex vitamins tablet Take 1 tablet by mouth daily.        . calcium carbonate (OS-CAL - DOSED IN MG OF ELEMENTAL CALCIUM) 1250 MG tablet Take 1 tablet by mouth daily.        . Cholecalciferol (VITAMIN D) 2000 UNITS tablet Take 2,000 Units by mouth daily.        . fish oil-omega-3 fatty acids 1000 MG capsule Take 1 g by mouth daily.        Marland Kitchen loperamide (IMODIUM) 2 MG capsule Take 2 mg by mouth as needed. Prn diarrhea, otc       . metoprolol succinate (TOPROL-XL) 25 MG 24 hr tablet Take 25 mg by mouth 2 (two) times daily.        . Multiple Vitamins-Minerals (MULTIVITAMINS THER. W/MINERALS) TABS Take 1 tablet by mouth daily.        . VOLTAREN 1 % GEL  No facility-administered encounter medications on file as of 05/10/2013.    Allergy: No Known Allergies  Social Hx:   History   Social History  . Marital Status: Married    Spouse Name: N/A    Number of Children: N/A  . Years of Education: N/A   Occupational History  . Not on file.   Social History Main Topics  . Smoking status: Never Smoker   . Smokeless tobacco: Not on file  . Alcohol Use: Yes     Comment: less then 2 a month  . Drug Use: No  . Sexually Active: No   Other Topics Concern  . Not on file   Social History Narrative  . No narrative on file    Past Surgical Hx:  Past Surgical History  Procedure Laterality Date  . Abdominal hysterectomy    . Tear duct  probing    . Appendectomy      1967  . Ectopic pregnancy surgery  1967  . Orthopedic surgery      Orthoscopic Knee Surgery  . Tooth extraction      bone graft    Past Medical Hx:  Past Medical History  Diagnosis Date  . Hypertension   . Diverticulosis of colon   . Cancer     1A grade endometrial ca  . Hx of radiation therapy 04/24/08-05/29/08& 8/12/,8/19,&06/27/08    external beam  through 7/09 ,then intracavity in 06/2008    Family Hx:  Family History  Problem Relation Age of Onset  . Diabetes type II Mother   . Cancer Mother   . Breast cancer Mother   . Leukemia Father   . Diabetes type II Sister     Vitals:  Blood pressure 110/76, pulse 64, temperature 98.1 F (36.7 C), temperature source Oral, resp. rate 16, height 5\' 5"  (1.651 m), weight 199 lb (90.266 kg).  Physical Exam: GENERAL: Well-nourished, well-developed female, in no acute distress.   NECK: Supple. There is no lymphadenopathy, no thyromegaly.   LUNGS: Clear to auscultation bilaterally.   CARDIOVASCULAR: Regular rate and rhythm.   ABDOMEN: Soft, nontender, nondistended. There are no palpable masses or hepatosplenomegaly. Groins are negative for adenopathy.   EXTREMITIES: There is 1+ to 2+ nonpitting edema equal bilaterally.   PELVIC: External genitalia is within normal limits though atrophic. The vaginal cuff is markedly atrophic. There are no gross visible lesions. There are postradiation changes. ThinPrep Pap was submitted without difficulty. Bimanual examination reveals no masses or nodularity. Rectal confirms.   ASSESSMENT: A 72 year old with a clinical stage IA, grade 1 endometrioid adenocarcinoma who underwent postoperative radiation therapy secondary to diffuse lymphovascular space involvement of [name] grade 1 tumor throughout the myometrium. She clinically has no evidence of recurrent disease about 4 years after the diagnosis.   PLAN:  1. We will follow up the results for Pap smear from today.  2.  She is > than 5 years from [name] diagnosis and will follow up with Dr. Jennette Kettle for [name] annual gynecologic care.    Otoniel Myhand A., MD 05/10/2013, 9:25 AM

## 2013-05-10 NOTE — Patient Instructions (Signed)
Exercise to Lose Weight Exercise and a healthy diet may help you lose weight. Your doctor may suggest specific exercises. EXERCISE IDEAS AND TIPS  Choose low-cost things you enjoy doing, such as walking, bicycling, or exercising to workout videos.  Take stairs instead of the elevator.  Walk during your lunch break.  Park your car further away from work or school.  Go to a gym or an exercise class.  Start with 5 to 10 minutes of exercise each day. Build up to 30 minutes of exercise 4 to 6 days a week.  Wear shoes with good support and comfortable clothes.  Stretch before and after working out.  Work out until you breathe harder and your heart beats faster.  Drink extra water when you exercise.  Do not do so much that you hurt yourself, feel dizzy, or get very short of breath. Exercises that burn about 150 calories:  Running 1  miles in 15 minutes.  Playing volleyball for 45 to 60 minutes.  Washing and waxing a car for 45 to 60 minutes.  Playing touch football for 45 minutes.  Walking 1  miles in 35 minutes.  Pushing a stroller 1  miles in 30 minutes.  Playing basketball for 30 minutes.  Raking leaves for 30 minutes.  Bicycling 5 miles in 30 minutes.  Walking 2 miles in 30 minutes.  Dancing for 30 minutes.  Shoveling snow for 15 minutes.  Swimming laps for 20 minutes.  Walking up stairs for 15 minutes.  Bicycling 4 miles in 15 minutes.  Gardening for 30 to 45 minutes.  Jumping rope for 15 minutes.  Washing windows or floors for 45 to 60 minutes. Document Released: 11/20/2010 Document Revised: 01/10/2012 Document Reviewed: 11/20/2010 [employer] Patient Information 2014 [employer]. Calorie Counting Diet A calorie counting diet requires you to eat the number of calories that are right for you in a day. Calories are the measurement of how much energy you get from the food you eat. Eating the right amount of calories is important for staying at a  healthy weight. If you eat too many calories, your body will store them as fat and you may gain weight. If you eat too few calories, you may lose weight. Counting the number of calories you eat during a day will help you know if you are eating the right amount. A Registered Dietitian can determine how many calories you need in a day. The amount of calories needed varies from person to person. If your goal is to lose weight, you will need to eat fewer calories. Losing weight can benefit you if you are overweight or have health problems such as heart disease, high blood pressure, or diabetes. If your goal is to gain weight, you will need to eat more calories. Gaining weight may be necessary if you have a certain health problem that causes your body to need more energy. TIPS Whether you are increasing or decreasing the number of calories you eat during a day, it may be hard to get used to changes in what you eat and drink. The following are tips to help you keep track of the number of calories you eat.  Measure foods at home with measuring cups. This helps you know the amount of food and number of calories you are eating.  Restaurants often serve food in amounts that are larger than 1 serving. While eating out, estimate how many servings of a food you are given. For example, a serving of cooked rice   is  cup or about the size of half of a fist. Knowing serving sizes will help you be aware of how much food you are eating at restaurants.  Ask for smaller portion sizes or child-size portions at restaurants.  Plan to eat half of a meal at a restaurant. Take the rest home or share the other half with a friend.  Read the Nutrition Facts panel on food labels for calorie content and serving size. You can find out how many servings are in a package, the size of a serving, and the number of calories each serving has.  For example, a package might contain 3 cookies. The Nutrition Facts panel on that package says  that 1 serving is 1 cookie. Below that, it will say there are 3 servings in the container. The calories section of the Nutrition Facts label says there are 90 calories. This means there are 90 calories in 1 cookie (1 serving). If you eat 1 cookie you have eaten 90 calories. If you eat all 3 cookies, you have eaten 270 calories (3 servings x 90 calories = 270 calories). The list below tells you how big or small some common portion sizes are.  1 oz.........4 stacked dice.  3 oz.........Deck of cards.  1 tsp........Tip of little finger.  1 tbs........Thumb.  2 tbs........Golf ball.   cup.......Half of a fist.  1 cup........A fist. KEEP A FOOD LOG Write down every food item you eat, the amount you eat, and the number of calories in each food you eat during the day. At the end of the day, you can add up the total number of calories you have eaten. It may help to keep a list like the one below. Find out the calorie information by reading the Nutrition Facts panel on food labels. Breakfast  Bran cereal (1 cup, 110 calories).  Fat-free milk ( cup, 45 calories). Snack  Apple (1 medium, 80 calories). Lunch  Spinach (1 cup, 20 calories).  Tomato ( medium, 20 calories).  Chicken breast strips (3 oz, 165 calories).  Shredded cheddar cheese ( cup, 110 calories).  Light Italian dressing (2 tbs, 60 calories).  Whole-wheat bread (1 slice, 80 calories).  Tub margarine (1 tsp, 35 calories).  Vegetable soup (1 cup, 160 calories). Dinner  Pork chop (3 oz, 190 calories).  Brown rice (1 cup, 215 calories).  Steamed broccoli ( cup, 20 calories).  Strawberries (1  cup, 65 calories).  Whipped cream (1 tbs, 50 calories). Daily Calorie Total: 1425 Document Released: 10/18/2005 Document Revised: 01/10/2012 Document Reviewed: 04/14/2007 [employer] Patient Information 2014 [employer].  

## 2013-05-16 ENCOUNTER — Telehealth: Payer: Self-pay | Admitting: Gynecologic Oncology

## 2013-05-16 NOTE — Telephone Encounter (Signed)
Pt notified about pap results: negative.  No questions or concerns voiced.  Instructed to call for any needs or concerns.

## 2014-01-08 ENCOUNTER — Encounter: Payer: Self-pay | Admitting: Internal Medicine

## 2014-05-16 ENCOUNTER — Encounter: Payer: Self-pay | Admitting: *Deleted

## 2014-05-16 ENCOUNTER — Encounter: Payer: Self-pay | Admitting: Internal Medicine

## 2014-05-24 ENCOUNTER — Encounter: Payer: Self-pay | Admitting: *Deleted

## 2014-07-25 ENCOUNTER — Ambulatory Visit: Payer: Medicare FFS | Admitting: Internal Medicine

## 2014-07-25 ENCOUNTER — Encounter: Payer: Self-pay | Admitting: Internal Medicine

## 2014-07-25 ENCOUNTER — Ambulatory Visit (INDEPENDENT_AMBULATORY_CARE_PROVIDER_SITE_OTHER): Payer: Medicare Other | Admitting: Internal Medicine

## 2014-07-25 VITALS — BP 106/64 | HR 72 | Ht 63.75 in | Wt 185.0 lb

## 2014-07-25 DIAGNOSIS — Z1211 Encounter for screening for malignant neoplasm of colon: Secondary | ICD-10-CM

## 2014-07-25 DIAGNOSIS — K56609 Unspecified intestinal obstruction, unspecified as to partial versus complete obstruction: Secondary | ICD-10-CM

## 2014-07-25 DIAGNOSIS — K520 Gastroenteritis and colitis due to radiation: Secondary | ICD-10-CM

## 2014-07-25 MED ORDER — METRONIDAZOLE 250 MG PO TABS: 250.0000 mg | ORAL_TABLET | Freq: Three times a day (TID) | ORAL | Status: DC

## 2014-07-25 MED ORDER — DICYCLOMINE HCL 20 MG PO TABS: 20.0000 mg | ORAL_TABLET | Freq: Two times a day (BID) | ORAL | Status: DC

## 2014-07-25 NOTE — Progress Notes (Signed)
[name] [birth year] [id number]  Note: This dictation was prepared with Dragon digital system. Any transcriptional errors that result from this procedure are unintentional.   History of Present Illness:  This is a 73 year old white female who has  a history of stage I A, grade 1 endometrial adenocarcinoma followed by Dr. Nori Riis and Rogelio Seen. She underwent  total abdominal hysterectomy and bilateral salpingo-oophorectomy in April 2009. 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 She had prior colonoscopies in 1994 and 2005 which showed diverticulosis of the left colon. Her last colonoscopy in April 2010 showed a narrow tortuous colon with thick folds. We were unable to traverse the colonoscope through and had to use a small upper endoscope which has a diameter of less than 9 mm which passed through the narrow lumen but could not reach the cecum. A CT scan of the abdomen in November 2012 for acute abdominal pain showed stranding in the pelvis with mild perienteric enteric fluid and small bowl mucosa hyperenhancement abuttingthe sigmoid colon consistent with inflammation. She has continued to have diarrhea and incontinence. The diarrhea and urgent bowel movements occur more than once a week and she usually looses control. There has been no blood per rectum.    Past Medical History  Diagnosis Date  . Hypertension   . Diverticulosis of colon   . Endometrial cancer     1A grade endometrial ca  . Hx of radiation therapy 04/24/08-05/29/08& 8/12/,8/19,&06/27/08    external beam  through 7/09 ,then intracavity in 06/2008    Past Surgical History  Procedure Laterality Date  . Abdominal hysterectomy    . Tear duct probing    . Appendectomy      1967  . Ectopic pregnancy surgery  1967  . Orthopedic surgery      Orthoscopic Knee Surgery  . Tooth extraction      bone graft    No Known Allergies  Family history and social history have been  reviewed.  Review of Systems: Denies nausea vomiting dysphagia weight loss other than intentional  The remainder of the 10 point ROS is negative except as outlined in the H&P  Physical Exam: General Appearance Well developed, in no distress Eyes  Non icteric  HEENT  Non traumatic, normocephalic  Mouth No lesion, tongue papillated, no cheilosis Neck Supple without adenopathy, thyroid not enlarged, no carotid bruits, no JVD Lungs Clear to auscultation bilaterally COR Normal S1, normal S2, regular rhythm, no murmur, quiet precordium Abdomen  Soft mildly obese with the horizontal suprapubic scar, normal active bowel sounds, no abnormal rushes. No distention. Mild tenderness in left lower quadrant Rectal decreased rectal sphincter tone. Soft formed Hemoccult-negative stool. 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 She has mild incompetence of the rectal sphincter. We have discussed her taking Imodium on a regular basis rather than after she has diarrhea. She would take one Imodium every other day. She will also take Bentyl 20 mg twice a day as an antispasmodic to help the urgency. We will give her Flagyl 250 mg 3 times a day for bacterial overgrowth for one week followed by probiotics daily. We will also proceed with a virtual colonoscopy since I was unable  to proceed with a full colonoscopy due to sigmoid obstruction  and because there is increased incidence of colon cancer after radiation. She agrees with plans for virtual colonoscopy.    Delfin Edis 07/25/2014

## 2014-07-25 NOTE — Patient Instructions (Addendum)
We have sent the following medications to your pharmacy for you to pick up at your convenience: Flagyl 250 mg three times daily x 7 days Bentyl 20 twice daily  After you have completed your Flagyl, please purchase a probiotic over the counter. Take one of these daily.  Please start taking Imodium 1 tablet every other night.  You have been scheduled for a virtual colonoscopy at [facility] on August 14, 2014 @ 8:00 am. Please arrive at 7:45 am for registration. The office is located at [place]. Their phone number is [phone] should you need to reschedule.  Please go by [facility] today after leaving here to pick up your prep.  CC:Dr Tommy Medal, Dr Nori Riis

## 2014-07-26 ENCOUNTER — Ambulatory Visit: Payer: Medicare FFS | Admitting: Internal Medicine

## 2014-08-14 ENCOUNTER — Ambulatory Visit
Admission: RE | Admit: 2014-08-14 | Discharge: 2014-08-14 | Disposition: A | Discharge status: Home / Self Care | Payer: No Typology Code available for payment source | Source: Ambulatory Visit | Attending: Internal Medicine | Admitting: Internal Medicine

## 2014-08-14 DIAGNOSIS — Z1211 Encounter for screening for malignant neoplasm of colon: Secondary | ICD-10-CM

## 2014-08-27 ENCOUNTER — Encounter: Payer: Self-pay | Admitting: Internal Medicine

## 2014-08-27 ENCOUNTER — Ambulatory Visit (INDEPENDENT_AMBULATORY_CARE_PROVIDER_SITE_OTHER): Payer: Medicare Other | Admitting: Internal Medicine

## 2014-08-27 VITALS — BP 124/62 | HR 84 | Ht 63.75 in | Wt 182.2 lb

## 2014-08-27 DIAGNOSIS — K520 Gastroenteritis and colitis due to radiation: Secondary | ICD-10-CM

## 2014-08-27 DIAGNOSIS — R197 Diarrhea, unspecified: Secondary | ICD-10-CM

## 2014-08-27 NOTE — Progress Notes (Signed)
[name] 1941-04-10 [id number]  Note: This dictation was prepared with Dragon digital system. Any transcriptional errors that result from this procedure are unintentional.   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 She is followed by Dr.Neal and Dr Rogelio Seen. She had prior colonoscopies in 1994 and 2005 but the most recent colonoscopy in April 2010 was incomplete due to partial colon obstruction at the level of the sigmoid colon most likely due to radiation, severe diverticulosis and adhesive disease. A CT scan of the abdomen showed stranding and mild perienteric fluid adjacent to the sigmoid colon.She is now due for recall colonoscopy but she instead underwent with virtual colonoscopy on 08/14/2014. She was again found to have diverticulosis and tortuous sigmoid colon consistent with partial obstruction. Her diarrhea has improved after taking Imodium 1 every other day, Bentyl 20 mg twice a day, a course of Flagyl 250 mg 3 times a day and probiotics.      Past Medical History  Diagnosis Date  . Hypertension   . Diverticulosis of colon   . Endometrial cancer     1A grade endometrial ca  . Hx of radiation therapy 04/24/08-05/29/08& 8/12/,8/19,&06/27/08    external beam  through 7/09 ,then intracavity in 06/2008    Past Surgical History  Procedure Laterality Date  . Abdominal hysterectomy    . Tear duct probing    . Appendectomy      1967  . Ectopic pregnancy surgery  1967  . Orthopedic surgery      Orthoscopic Knee Surgery  . Tooth extraction      bone graft    No Known Allergies  Family history and social  history have been reviewed.  Review of Systems: Continue diarrhea although improved. Weight loss of about 40 pounds since cancer diagnosed in 2009. Denies rectal bleeding  The remainder of the 10 point ROS is negative except as outlined in the H&P  Physical Exam: General Appearance Well developed, in no distress Eyes  Non icteric  HEENT  Non traumatic, normocephalic  Mouth No lesion, tongue papillated, no cheilosis Neck Supple without adenopathy, thyroid not enlarged, no carotid bruits, no JVD Lungs Clear to auscultation bilaterally COR Normal S1, normal S2, regular rhythm, no murmur, quiet precordium Abdomen External radiation skin changes. Normoactive bowel sounds. 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 She is now somewhat improved and will continue on the same regimen of antispasmodic, probiotics and Imodium. I suggested this regimen as opposed to a surgical resection of her sigmoid colon. Radiation enteritis is a contributing factor  And she should follow low residue diet and take Probiotics to prevent bacterial overgrowth..Consider adding Colestid is diarrhea persists.  Problem #2 Stage IA endometrial cancer, not active, followed by Dr Nori Riis /Dr Baltazar Najjar 08/27/2014

## 2014-08-27 NOTE — Patient Instructions (Signed)
Please continue imodium, Bentyl and probiotics daily.  CC:Dr Tommy Medal

## 2014-10-27 ENCOUNTER — Other Ambulatory Visit: Payer: Self-pay | Admitting: Internal Medicine

## 2014-11-19 ENCOUNTER — Telehealth: Payer: Self-pay | Admitting: Internal Medicine

## 2014-11-19 NOTE — Telephone Encounter (Signed)
Called [employer] and requested prior authorization for Dicyclomine. Per [facility], they will fax over insurance prior authorization form.

## 2015-02-07 ENCOUNTER — Other Ambulatory Visit: Payer: Self-pay | Admitting: Internal Medicine

## 2015-02-07 NOTE — Telephone Encounter (Signed)
Sent Rx for dicyclomine (BENTYL), 20 mg, #60 with 2 refills to [facility] in [place], [place] on 02/07/15.

## 2015-05-19 ENCOUNTER — Other Ambulatory Visit: Payer: Self-pay | Admitting: Obstetrics & Gynecology

## 2015-05-21 ENCOUNTER — Other Ambulatory Visit: Payer: Self-pay | Admitting: Internal Medicine

## 2015-05-22 LAB — CYTOLOGY - PAP

## 2015-08-26 ENCOUNTER — Telehealth: Payer: Self-pay | Admitting: *Deleted

## 2015-08-26 NOTE — Telephone Encounter (Signed)
LM for the patient on her home number. Asked her to call our office regarding her prescription for Dicyclomine ( bentyl)  from [facility].  Advised her Dr. Olevia Perches retired.  We have 2 experienced doctors we would like to inform her that are taking patients at this time.

## 2015-09-01 ENCOUNTER — Telehealth: Payer: Self-pay | Admitting: Internal Medicine

## 2015-09-01 MED ORDER — DICYCLOMINE HCL 20 MG PO TABS: 20.0000 mg | ORAL_TABLET | Freq: Two times a day (BID) | ORAL | Status: DC

## 2015-09-01 NOTE — Telephone Encounter (Signed)
Please advise 

## 2015-09-01 NOTE — Telephone Encounter (Signed)
Ok to send refills to last till her appt

## 2015-09-01 NOTE — Telephone Encounter (Signed)
Medication refilled until patient's appointment.

## 2015-10-25 ENCOUNTER — Other Ambulatory Visit: Payer: Self-pay | Admitting: Gastroenterology

## 2015-10-30 ENCOUNTER — Encounter: Payer: Self-pay | Admitting: Gastroenterology

## 2015-10-30 ENCOUNTER — Ambulatory Visit (INDEPENDENT_AMBULATORY_CARE_PROVIDER_SITE_OTHER): Payer: PPO | Admitting: Gastroenterology

## 2015-10-30 VITALS — BP 116/62 | HR 72 | Ht 64.5 in | Wt 174.4 lb

## 2015-10-30 DIAGNOSIS — K5900 Constipation, unspecified: Secondary | ICD-10-CM

## 2015-10-30 DIAGNOSIS — K589 Irritable bowel syndrome without diarrhea: Secondary | ICD-10-CM | POA: Diagnosis not present

## 2015-10-30 DIAGNOSIS — Z1211 Encounter for screening for malignant neoplasm of colon: Secondary | ICD-10-CM

## 2015-10-30 MED ORDER — POLYETHYLENE GLYCOL 3350 17 GM/SCOOP PO POWD: ORAL | Status: DC

## 2015-10-30 NOTE — Patient Instructions (Signed)
We have sent your demographic and insurance information to [employer]. They should contact you within the next week regarding your Cologuard (colon cancer screening) test. If you have not heard from them within the next week, please call our office at [phone].  Use Miralax 1/2 capful daily

## 2015-10-30 NOTE — Progress Notes (Signed)
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 She had prior colonoscopies in 1994 and 2005 but the most recent colonoscopy in April 2010 was incomplete due to partial colon obstruction at the level of the sigmoid colon most likely due to radiation, severe diverticulosis and adhesive disease. A CT scan of the abdomen showed stranding and mild perienteric fluid adjacent to the sigmoid colon.She  Subsequently had virtual colonoscopy in 2015 did not show any evidence of large polyps or malignancy but the sigmoid and descending colon was not completely distensible.  Patient continues to have intermittent constipation alternating with diarrhea.  Denies any bright red blood per rectum.    Outpatient Encounter Prescriptions as of 10/30/2015  Medication Sig  . aspirin EC 81 MG tablet Take 81 mg by mouth daily.    Marland Kitchen b complex vitamins tablet Take 1 tablet by mouth daily.    . calcium carbonate (OS-CAL - DOSED IN MG OF ELEMENTAL CALCIUM) 1250 MG tablet Take 1 tablet by mouth daily.    . Cholecalciferol (VITAMIN D) 2000 UNITS tablet Take 2,000 Units by mouth daily.    Marland Kitchen dicyclomine (BENTYL) 20 MG tablet TAKE 1 TABLET BY MOUTH TWICE A DAY  . Krill Oil Omega-3 300 MG CAPS Take 1 capsule by mouth daily.  Marland Kitchen loperamide (IMODIUM) 2 MG capsule Take 2 mg by mouth as needed. Prn diarrhea, otc   . metoprolol succinate (TOPROL-XL) 25 MG 24 hr tablet Take 25 mg  by mouth 2 (two) times daily.    . Multiple Vitamins-Minerals (MULTIVITAMINS THER. W/MINERALS) TABS Take 1 tablet by mouth daily.    . [DISCONTINUED] metroNIDAZOLE (FLAGYL) 250 MG tablet Take 1 tablet (250 mg total) by mouth 3 (three) times daily.   No facility-administered encounter medications on file as of 10/30/2015.    Allergies as of 10/30/2015  . (No Known Allergies)    Past Medical History  Diagnosis Date  . Hypertension   . Diverticulosis of colon   . Endometrial cancer ([place])     1A grade endometrial ca  . Hx of radiation therapy 04/24/08-05/29/08& 8/12/,8/19,&06/27/08    external beam  through 7/09 ,then intracavity in 06/2008    Past Surgical History  Procedure Laterality Date  . Abdominal hysterectomy    . Tear duct probing    . Appendectomy      1967  . Ectopic pregnancy surgery  1967  . Orthopedic surgery      Orthoscopic Knee Surgery  . Tooth extraction      bone graft    Family History  Problem Relation Age of Onset  . Diabetes type II Mother   . Breast cancer Mother   . Leukemia Father   . Diabetes type II Sister   . Colon cancer Neg Hx   . Breast cancer Sister   . Pancreatic cancer Maternal Aunt     x2  . Diabetes Maternal Grandmother   . Diabetes Maternal Grandfather  Social History   Social History  . Marital Status: Married    Spouse Name: N/A  . Number of Children: 1  . Years of Education: N/A   Occupational History  . Web designer    Social History Main Topics  . Smoking status: Never Smoker   . Smokeless tobacco: Never Used  . Alcohol Use: 0.0 oz/week    0 Standard drinks or equivalent per week     Comment: less then 2 a month  . Drug Use: No  . Sexual Activity: No   Other Topics Concern  . Not on file   Social History Narrative      Review of systems: Review of Systems  Constitutional: Negative for fever and chills.  HENT: Negative.   Eyes: Negative for blurred vision.  Respiratory: Negative for  cough, shortness of breath and wheezing.   Cardiovascular: Negative for chest pain and palpitations.  Gastrointestinal: as per HPI Genitourinary: Negative for dysuria, urgency, frequency and hematuria.  Musculoskeletal: Negative for myalgias, back pain and joint pain.  Skin: Negative for itching and rash.  Neurological: Negative for dizziness, tremors, focal weakness, seizures and loss of consciousness.  Endo/Heme/Allergies: Negative for environmental allergies.  Psychiatric/Behavioral: Negative for depression, suicidal ideas and hallucinations.  All other systems reviewed and are negative.   Physical Exam: Filed Vitals:   10/30/15 1540  BP: 116/62  Pulse: 72   Gen:      No acute distress HEENT:  EOMI, sclera anicteric Neck:     No masses; no thyromegaly Lungs:    Clear to auscultation bilaterally; normal respiratory effort CV:         Regular rate and rhythm; no murmurs Abd:      + bowel sounds; soft, non-tender; no palpable masses, no distension Ext:    No edema; adequate peripheral perfusion Skin:      Warm and dry; no rash Neuro: alert and oriented x 3 Psych: normal mood and affect  Data Reviewed:   reviewed her chart and recent pertinent GI workup   Assessment and Plan/Recommendations:   74 year old female with history of irritable bowel syndrome,  alternating diarrhea and constipation,  severe sigmoid diverticulosis,  radiation enteritis and likely significant pelvic adhesions status post total hysterectomy for endometrial cancer with chemoradiation here for follow-up visit.  she had an incomplete colonoscopy in 2010 and subsequent virtual colonoscopy in 2015 but had inadequate visualization of the descending and sigmoid colon due to the severe diverticulosis and limited distensibility  we will order Colo guard (FIT) for colorectal cancer screening  return as needed  K. Denzil Magnuson , MD [phone] Mon-Fri 8a-5p [phone] after 5p, weekends, holidays   Start MiraLAX  half a capful daily and titrate based on symptoms

## 2016-01-12 ENCOUNTER — Other Ambulatory Visit: Payer: Self-pay | Admitting: Gastroenterology

## 2016-03-06 ENCOUNTER — Other Ambulatory Visit: Payer: Self-pay | Admitting: Gastroenterology

## 2016-05-19 ENCOUNTER — Other Ambulatory Visit: Payer: Self-pay | Admitting: Gastroenterology

## 2016-05-31 DIAGNOSIS — R109 Unspecified abdominal pain: Secondary | ICD-10-CM | POA: Diagnosis not present

## 2016-05-31 DIAGNOSIS — R1030 Lower abdominal pain, unspecified: Secondary | ICD-10-CM | POA: Diagnosis not present

## 2016-05-31 DIAGNOSIS — K219 Gastro-esophageal reflux disease without esophagitis: Secondary | ICD-10-CM | POA: Diagnosis not present

## 2016-06-09 DIAGNOSIS — N958 Other specified menopausal and perimenopausal disorders: Secondary | ICD-10-CM | POA: Diagnosis not present

## 2016-06-09 DIAGNOSIS — Z6829 Body mass index (BMI) 29.0-29.9, adult: Secondary | ICD-10-CM | POA: Diagnosis not present

## 2016-06-09 DIAGNOSIS — Z01419 Encounter for gynecological examination (general) (routine) without abnormal findings: Secondary | ICD-10-CM | POA: Diagnosis not present

## 2016-06-09 DIAGNOSIS — Z1231 Encounter for screening mammogram for malignant neoplasm of breast: Secondary | ICD-10-CM | POA: Diagnosis not present

## 2016-06-14 DIAGNOSIS — K219 Gastro-esophageal reflux disease without esophagitis: Secondary | ICD-10-CM | POA: Diagnosis not present

## 2016-06-17 ENCOUNTER — Other Ambulatory Visit: Payer: Self-pay | Admitting: Internal Medicine

## 2016-06-17 DIAGNOSIS — D649 Anemia, unspecified: Secondary | ICD-10-CM | POA: Diagnosis not present

## 2016-06-17 DIAGNOSIS — R110 Nausea: Secondary | ICD-10-CM | POA: Diagnosis not present

## 2016-06-17 DIAGNOSIS — R109 Unspecified abdominal pain: Secondary | ICD-10-CM | POA: Diagnosis not present

## 2016-06-17 DIAGNOSIS — K219 Gastro-esophageal reflux disease without esophagitis: Secondary | ICD-10-CM | POA: Diagnosis not present

## 2016-06-17 DIAGNOSIS — R1011 Right upper quadrant pain: Secondary | ICD-10-CM

## 2016-06-17 DIAGNOSIS — N390 Urinary tract infection, site not specified: Secondary | ICD-10-CM | POA: Diagnosis not present

## 2016-06-28 ENCOUNTER — Ambulatory Visit
Admission: RE | Admit: 2016-06-28 | Discharge: 2016-06-28 | Disposition: A | Discharge status: Home / Self Care | Payer: PPO | Source: Ambulatory Visit | Attending: Internal Medicine | Admitting: Internal Medicine

## 2016-06-28 DIAGNOSIS — R1011 Right upper quadrant pain: Secondary | ICD-10-CM

## 2016-07-07 ENCOUNTER — Ambulatory Visit (INDEPENDENT_AMBULATORY_CARE_PROVIDER_SITE_OTHER): Payer: PPO | Admitting: Physician Assistant

## 2016-07-07 ENCOUNTER — Encounter (INDEPENDENT_AMBULATORY_CARE_PROVIDER_SITE_OTHER): Payer: Self-pay

## 2016-07-07 ENCOUNTER — Encounter: Payer: Self-pay | Admitting: Physician Assistant

## 2016-07-07 VITALS — BP 124/70 | HR 78 | Ht 64.25 in | Wt 176.0 lb

## 2016-07-07 DIAGNOSIS — K520 Gastroenteritis and colitis due to radiation: Secondary | ICD-10-CM

## 2016-07-07 DIAGNOSIS — K6389 Other specified diseases of intestine: Secondary | ICD-10-CM | POA: Diagnosis not present

## 2016-07-07 NOTE — Progress Notes (Signed)
Subjective:    Patient ID: [name], female    DOB: May 20, 1941, 75 y.o.   MRN: [id number]  HPI  [name] is a pleasant 75 year old white female known to Dr. Silverio Decamp who has a diagnosis of radiation enteritis. She had endometrial cancer in 2009 for which she underwent TAH and BSO followed by whole pelvis radiation. Last colonoscopy was done in 2010 which was incomplete and subsequent virtual colonoscopy in 2015 showed the colon not to be completely distensible and with finding of severe left colon diverticulosis. Patient comes in today with complaints of episodes of upper abdominal pain. She had a recent ultrasound done on 06/28/2016 which showed no evidence of gallstones and common bile duct of 4.3 mm. He shouldn't says she has had sporadic bouts of upper abdominal pain and had one several weeks back but has been feeling better since that time. She says when she has these episodes she has upper abdominal pain some cramping nausea which may last for several days and then gradually improved. Generally does not have any change in her bowel habits no fever or chills. She was put on a PPI recently. She is not on any regular NSAIDs. She does take Bentyl chronically twice a day for spasms.   Patient had a hospitalization in 2012 and says she was diagnosed with a" bacterial infection "in her intestine at that time. She feels that she definitely improved with Cipro and Flagyl. She says these recurring episodes are very similar.  Review of Systems Pertinent positive and negative review of systems were noted in the above HPI section.  All other review of systems was otherwise negative.  Outpatient Encounter Prescriptions as of 07/07/2016  Medication Sig  . aspirin EC 81 MG tablet Take 81 mg by mouth daily.    Marland Kitchen b complex vitamins tablet Take 1 tablet by mouth daily.    . calcium carbonate (OS-CAL - DOSED IN MG OF ELEMENTAL CALCIUM) 1250 MG tablet Take 1 tablet by mouth daily.    . Cholecalciferol (VITAMIN D)  2000 UNITS tablet Take 2,000 Units by mouth daily.    Marland Kitchen dicyclomine (BENTYL) 20 MG tablet TAKE 1 TABLET BY MOUTH TWICE A DAY  . Krill Oil Omega-3 300 MG CAPS Take 1 capsule by mouth daily.  Marland Kitchen loperamide (IMODIUM) 2 MG capsule Take 2 mg by mouth as needed. Prn diarrhea, otc   . metoprolol succinate (TOPROL-XL) 25 MG 24 hr tablet Take 25 mg by mouth 2 (two) times daily.    . Multiple Vitamins-Minerals (MULTIVITAMINS THER. W/MINERALS) TABS Take 1 tablet by mouth daily.    Marland Kitchen omeprazole (PRILOSEC) 20 MG capsule Take 20 mg by mouth daily.  . polyethylene glycol (MIRALAX / GLYCOLAX) packet Take 17 g by mouth daily as needed.  . [DISCONTINUED] polyethylene glycol powder (GLYCOLAX/MIRALAX) powder Use 1/2 capful daily   No facility-administered encounter medications on file as of 07/07/2016.    Allergies  Allergen Reactions  . Codeine Other (See Comments)    Hyper   Patient Active Problem List   Diagnosis Date Noted  . Cancer ([place])   . Enteritis 09/17/2011  . CANCER, ENDOMETRIUM 01/24/2009  . HYPERTENSION 01/24/2009  . DIVERTICULOSIS OF COLON 01/24/2009  . RECTAL BLEEDING 01/24/2009  . Diarrhea 01/24/2009  . HEMORRHOIDS, HX OF 01/24/2009   Social History   Social History  . Marital status: Married    Spouse name: N/A  . Number of children: 1  . Years of education: N/A   Occupational History  .  Web designer    Social History Main Topics  . Smoking status: Never Smoker  . Smokeless tobacco: Never Used  . Alcohol use 0.0 oz/week     Comment: less then 2 a month  . Drug use: No  . Sexual activity: No   Other Topics Concern  . Not on file   Social History Narrative  . No narrative on file    [name]'s family history includes Breast cancer in her mother and sister; Diabetes in her maternal grandfather and maternal grandmother; Diabetes type II in her mother and sister; Leukemia in her father; Pancreatic cancer in her maternal aunt.      Objective:    Vitals:    07/07/16 0901  BP: 124/70  Pulse: 78    Physical Exam  well-developed older white female in no acute distress, pleasant blood pressure 124/70 pulse 78, height 5 foot 4 weight 176 BMI of 29.9. HEENT; nontraumatic normocephalic EOMI PERRLA sclera anicteric, Cardiovascular;regular rate and rhythm with S1-S2 no murmur or gallop, Pulmonary ;clear bilaterally, Abdomen; obese soft ,no  upper abdominal tenderness across the upper abdomen there is no guarding or rebound no palpable mass or hepatosplenomegaly, bowel sounds are present, Rectal ;exam not done, Ext; no clubbing cyanosis or edema skin warm and dry, Neuropsych; mood and affect appropriate       Assessment & Plan:   #64 75 year old white female with history of radiation enteritis status post whole pelvis radiation 2009 for  endometrial CA who has had recurring episodes of upper abdominal pain cramping and nausea. Patient had a recent episode which has since improved not completely resolved. Suspect symptoms are secondary to radiation enteritis, consider superimposed small intestinal bacterial overgrowth #2 chronic partial low-grade colonic obstruction felt secondary to adhesions and extensive diverticulosis with previous incomplete colonoscopy in 2010 and virtual colonoscopy showing a non-distensible: In 2015 with severe left sided diverticulosis  Plan; we'll give patient a trial of Xifaxan 550 mg by mouth 3 times a day 10 days-if this is helpful she may be treated periodically for exacerbations. Is asked to call back in 2 weeks with an update and if she has not had any improvement after the empiric trial of Xifaxan and she may need CT of the abdomen and pelvis. She will continue Bentyl 10 mg by mouth twice a day and continue omeprazole 20 mg by mouth every morning    Arcadia Gorgas S Draiden Mirsky PA-C 07/07/2016   Cc: Tommy Medal, MD

## 2016-07-07 NOTE — Patient Instructions (Signed)
Continue Prilosec 20 mg.  Continue the Bentyl 10 mg , take 1 tab twice daily.  We have provided you with samples of Xifaxan 550 mg.  Take 1 tablet 3 times daily with food until gone.  Call in 2 weeks to speak to Amy's nurse, or Travaughn Vue, CMA,. If you are no better , you may need a CT scan of abdomen.

## 2016-07-08 NOTE — Progress Notes (Signed)
Reviewed and agree with documentation and assessment and plan. K. Veena Beya Tipps , MD   

## 2016-07-15 ENCOUNTER — Other Ambulatory Visit: Payer: Self-pay | Admitting: Gastroenterology

## 2016-07-20 ENCOUNTER — Telehealth: Payer: Self-pay | Admitting: Physician Assistant

## 2016-07-20 NOTE — Telephone Encounter (Signed)
See the 07/07/16 office note.

## 2016-07-30 ENCOUNTER — Telehealth: Payer: Self-pay | Admitting: Gastroenterology

## 2016-07-30 ENCOUNTER — Telehealth: Payer: Self-pay | Admitting: *Deleted

## 2016-07-30 MED ORDER — DICYCLOMINE HCL 20 MG PO TABS: 20.0000 mg | ORAL_TABLET | Freq: Two times a day (BID) | ORAL | 3 refills | Status: DC

## 2016-07-30 NOTE — Telephone Encounter (Signed)
Manufactor back order on 10 mg and 20 mg. If it is helping, can it be changed to a similar.

## 2016-07-30 NOTE — Telephone Encounter (Signed)
Patient requested 90 day supply.Durene Cal to pharmacy today

## 2016-07-31 NOTE — Telephone Encounter (Signed)
Ok to switch to alternative

## 2016-08-14 ENCOUNTER — Other Ambulatory Visit: Payer: Self-pay | Admitting: Gastroenterology

## 2016-08-20 ENCOUNTER — Telehealth: Payer: Self-pay | Admitting: Gastroenterology

## 2016-08-20 MED ORDER — GLYCOPYRROLATE 2 MG PO TABS: 2.0000 mg | ORAL_TABLET | Freq: Two times a day (BID) | ORAL | 1 refills | Status: DC

## 2016-08-20 NOTE — Telephone Encounter (Signed)
Dr Darleen Crocker- Sent in glycopyrrolate 2 mg to pharmacy to substitute for the dicyclomine  , it was out of stock at pharmacy.Marland KitchenMarland KitchenMarland Kitchen

## 2016-08-22 NOTE — Telephone Encounter (Signed)
ok 

## 2016-08-30 DIAGNOSIS — H2513 Age-related nuclear cataract, bilateral: Secondary | ICD-10-CM | POA: Diagnosis not present

## 2016-08-30 DIAGNOSIS — Z0100 Encounter for examination of eyes and vision without abnormal findings: Secondary | ICD-10-CM | POA: Diagnosis not present

## 2016-08-30 DIAGNOSIS — H35371 Puckering of macula, right eye: Secondary | ICD-10-CM | POA: Diagnosis not present

## 2016-10-11 DIAGNOSIS — Z0000 Encounter for general adult medical examination without abnormal findings: Secondary | ICD-10-CM | POA: Diagnosis not present

## 2016-10-11 DIAGNOSIS — E559 Vitamin D deficiency, unspecified: Secondary | ICD-10-CM | POA: Diagnosis not present

## 2016-10-14 DIAGNOSIS — K8020 Calculus of gallbladder without cholecystitis without obstruction: Secondary | ICD-10-CM | POA: Diagnosis not present

## 2016-10-14 DIAGNOSIS — Z7982 Long term (current) use of aspirin: Secondary | ICD-10-CM | POA: Diagnosis not present

## 2016-10-14 DIAGNOSIS — Z0001 Encounter for general adult medical examination with abnormal findings: Secondary | ICD-10-CM | POA: Diagnosis not present

## 2016-10-14 DIAGNOSIS — I10 Essential (primary) hypertension: Secondary | ICD-10-CM | POA: Diagnosis not present

## 2016-11-10 DIAGNOSIS — R109 Unspecified abdominal pain: Secondary | ICD-10-CM | POA: Diagnosis not present

## 2016-11-19 ENCOUNTER — Telehealth: Payer: Self-pay

## 2016-11-19 NOTE — Telephone Encounter (Signed)
Please request it again if patient is willing to do it Thanks

## 2016-11-19 NOTE — Telephone Encounter (Signed)
cologuard was cancelled as the order has been expired. cologuard was ordered on 10-30-2015.

## 2016-11-25 NOTE — Telephone Encounter (Signed)
Left message to return call 

## 2016-12-10 NOTE — Telephone Encounter (Signed)
Left message to return call 

## 2016-12-15 ENCOUNTER — Other Ambulatory Visit: Payer: Self-pay

## 2016-12-15 NOTE — Telephone Encounter (Signed)
ok 

## 2016-12-15 NOTE — Telephone Encounter (Signed)
No return communication from the patient. Last attempt is a letter mailed to pts home address, sent today in the mail.

## 2016-12-23 ENCOUNTER — Telehealth: Payer: Self-pay | Admitting: Physician Assistant

## 2016-12-27 NOTE — Telephone Encounter (Signed)
Pt contacted. She states her kit has expired. She wil call [employer] to have the kit reshipped.

## 2017-04-08 DIAGNOSIS — B078 Other viral warts: Secondary | ICD-10-CM | POA: Diagnosis not present

## 2017-04-08 DIAGNOSIS — L820 Inflamed seborrheic keratosis: Secondary | ICD-10-CM | POA: Diagnosis not present

## 2017-06-28 DIAGNOSIS — Z124 Encounter for screening for malignant neoplasm of cervix: Secondary | ICD-10-CM | POA: Diagnosis not present

## 2017-06-28 DIAGNOSIS — Z1231 Encounter for screening mammogram for malignant neoplasm of breast: Secondary | ICD-10-CM | POA: Diagnosis not present

## 2017-06-28 DIAGNOSIS — Z01419 Encounter for gynecological examination (general) (routine) without abnormal findings: Secondary | ICD-10-CM | POA: Diagnosis not present

## 2017-06-28 DIAGNOSIS — Z6829 Body mass index (BMI) 29.0-29.9, adult: Secondary | ICD-10-CM | POA: Diagnosis not present

## 2017-08-05 ENCOUNTER — Other Ambulatory Visit: Payer: Self-pay | Admitting: Gastroenterology

## 2017-08-10 DIAGNOSIS — R600 Localized edema: Secondary | ICD-10-CM | POA: Diagnosis not present

## 2017-08-10 DIAGNOSIS — R42 Dizziness and giddiness: Secondary | ICD-10-CM | POA: Diagnosis not present

## 2017-08-15 DIAGNOSIS — R42 Dizziness and giddiness: Secondary | ICD-10-CM | POA: Diagnosis not present

## 2017-08-29 DIAGNOSIS — R42 Dizziness and giddiness: Secondary | ICD-10-CM | POA: Diagnosis not present

## 2017-08-31 DIAGNOSIS — R42 Dizziness and giddiness: Secondary | ICD-10-CM | POA: Diagnosis not present

## 2017-08-31 DIAGNOSIS — R609 Edema, unspecified: Secondary | ICD-10-CM | POA: Diagnosis not present

## 2017-09-26 DIAGNOSIS — H5203 Hypermetropia, bilateral: Secondary | ICD-10-CM | POA: Diagnosis not present

## 2017-09-26 DIAGNOSIS — H35371 Puckering of macula, right eye: Secondary | ICD-10-CM | POA: Diagnosis not present

## 2017-09-26 DIAGNOSIS — H2513 Age-related nuclear cataract, bilateral: Secondary | ICD-10-CM | POA: Diagnosis not present

## 2017-10-13 DIAGNOSIS — Z7982 Long term (current) use of aspirin: Secondary | ICD-10-CM | POA: Diagnosis not present

## 2017-10-13 DIAGNOSIS — E559 Vitamin D deficiency, unspecified: Secondary | ICD-10-CM | POA: Diagnosis not present

## 2017-10-13 DIAGNOSIS — Z0000 Encounter for general adult medical examination without abnormal findings: Secondary | ICD-10-CM | POA: Diagnosis not present

## 2017-10-13 DIAGNOSIS — Z23 Encounter for immunization: Secondary | ICD-10-CM | POA: Diagnosis not present

## 2017-10-13 DIAGNOSIS — I10 Essential (primary) hypertension: Secondary | ICD-10-CM | POA: Diagnosis not present

## 2017-10-13 DIAGNOSIS — D649 Anemia, unspecified: Secondary | ICD-10-CM | POA: Diagnosis not present

## 2017-10-18 DIAGNOSIS — K5730 Diverticulosis of large intestine without perforation or abscess without bleeding: Secondary | ICD-10-CM | POA: Diagnosis not present

## 2017-10-18 DIAGNOSIS — Z6828 Body mass index (BMI) 28.0-28.9, adult: Secondary | ICD-10-CM | POA: Diagnosis not present

## 2017-10-18 DIAGNOSIS — Z923 Personal history of irradiation: Secondary | ICD-10-CM | POA: Diagnosis not present

## 2017-10-18 DIAGNOSIS — K8020 Calculus of gallbladder without cholecystitis without obstruction: Secondary | ICD-10-CM | POA: Diagnosis not present

## 2017-10-18 DIAGNOSIS — R079 Chest pain, unspecified: Secondary | ICD-10-CM | POA: Diagnosis not present

## 2017-10-18 DIAGNOSIS — M8580 Other specified disorders of bone density and structure, unspecified site: Secondary | ICD-10-CM | POA: Diagnosis not present

## 2017-10-18 DIAGNOSIS — I10 Essential (primary) hypertension: Secondary | ICD-10-CM | POA: Diagnosis not present

## 2017-10-18 DIAGNOSIS — K529 Noninfective gastroenteritis and colitis, unspecified: Secondary | ICD-10-CM | POA: Diagnosis not present

## 2017-10-18 DIAGNOSIS — Z7982 Long term (current) use of aspirin: Secondary | ICD-10-CM | POA: Diagnosis not present

## 2017-10-18 DIAGNOSIS — Z0001 Encounter for general adult medical examination with abnormal findings: Secondary | ICD-10-CM | POA: Diagnosis not present

## 2017-10-18 DIAGNOSIS — D509 Iron deficiency anemia, unspecified: Secondary | ICD-10-CM | POA: Diagnosis not present

## 2017-10-18 DIAGNOSIS — Z8542 Personal history of malignant neoplasm of other parts of uterus: Secondary | ICD-10-CM | POA: Diagnosis not present

## 2017-10-31 ENCOUNTER — Ambulatory Visit
Admission: RE | Admit: 2017-10-31 | Discharge: 2017-10-31 | Disposition: A | Discharge status: Home / Self Care | Payer: PPO | Source: Ambulatory Visit | Attending: Orthopaedic Surgery | Admitting: Orthopaedic Surgery

## 2017-10-31 ENCOUNTER — Other Ambulatory Visit: Payer: Self-pay | Admitting: Orthopaedic Surgery

## 2017-10-31 ENCOUNTER — Other Ambulatory Visit: Payer: Self-pay | Admitting: Cardiology

## 2017-10-31 DIAGNOSIS — R079 Chest pain, unspecified: Secondary | ICD-10-CM

## 2017-10-31 DIAGNOSIS — I10 Essential (primary) hypertension: Secondary | ICD-10-CM | POA: Diagnosis not present

## 2017-11-08 ENCOUNTER — Other Ambulatory Visit (INDEPENDENT_AMBULATORY_CARE_PROVIDER_SITE_OTHER): Payer: PPO

## 2017-11-08 ENCOUNTER — Encounter: Payer: Self-pay | Admitting: Physician Assistant

## 2017-11-08 ENCOUNTER — Encounter (INDEPENDENT_AMBULATORY_CARE_PROVIDER_SITE_OTHER): Payer: Self-pay

## 2017-11-08 ENCOUNTER — Ambulatory Visit (INDEPENDENT_AMBULATORY_CARE_PROVIDER_SITE_OTHER): Payer: PPO | Admitting: Physician Assistant

## 2017-11-08 VITALS — BP 100/62 | HR 68 | Ht 63.75 in | Wt 161.0 lb

## 2017-11-08 DIAGNOSIS — R195 Other fecal abnormalities: Secondary | ICD-10-CM | POA: Diagnosis not present

## 2017-11-08 DIAGNOSIS — R1013 Epigastric pain: Secondary | ICD-10-CM

## 2017-11-08 DIAGNOSIS — D509 Iron deficiency anemia, unspecified: Secondary | ICD-10-CM

## 2017-11-08 DIAGNOSIS — K520 Gastroenteritis and colitis due to radiation: Secondary | ICD-10-CM | POA: Diagnosis not present

## 2017-11-08 DIAGNOSIS — K565 Intestinal adhesions [bands], unspecified as to partial versus complete obstruction: Secondary | ICD-10-CM

## 2017-11-08 LAB — CBC WITH DIFFERENTIAL/PLATELET
Basophils Absolute: 0 10*3/uL (ref 0.0–0.1)
Basophils Relative: 0.5 % (ref 0.0–3.0)
Eosinophils Absolute: 0.2 10*3/uL (ref 0.0–0.7)
Eosinophils Relative: 2.1 % (ref 0.0–5.0)
HCT: 37.1 % (ref 36.0–46.0)
Hemoglobin: 11.8 g/dL — ABNORMAL LOW (ref 12.0–15.0)
Lymphocytes Relative: 12.2 % (ref 12.0–46.0)
Lymphs Abs: 1 10*3/uL (ref 0.7–4.0)
MCHC: 31.8 g/dL (ref 30.0–36.0)
MCV: 81.3 fl (ref 78.0–100.0)
Monocytes Absolute: 0.9 10*3/uL (ref 0.1–1.0)
Monocytes Relative: 11 % (ref 3.0–12.0)
Neutro Abs: 5.9 10*3/uL (ref 1.4–7.7)
Neutrophils Relative %: 74.2 % (ref 43.0–77.0)
Platelets: 235 10*3/uL (ref 150.0–400.0)
RBC: 4.56 Mil/uL (ref 3.87–5.11)
RDW: 22.8 % — ABNORMAL HIGH (ref 11.5–15.5)
WBC: 8 10*3/uL (ref 4.0–10.5)

## 2017-11-08 LAB — HIGH SENSITIVITY CRP: CRP, High Sensitivity: 1.17 mg/L (ref 0.000–5.000)

## 2017-11-08 NOTE — Patient Instructions (Addendum)
Please go to the basement level to have your labs drawn.  Continue the Omeprazole 20 mg, take 1 tab by mouth every  Morning.  You have been scheduled for an endoscopy. Please follow written instructions given to you at your visit today. If you use inhalers (even only as needed), please bring them with you on the day of your procedure. Your physician has requested that you go to www.startemmi.com and enter the access code given to you at your visit today. This web site gives a general overview about your procedure. However, you should still follow specific instructions given to you by our office regarding your preparation for the procedure.   We scheduled the Virtual Colonoscopy for Wed  12-07-2017, at 9:00 am. You need to register that day at 8:40 am. You will need to go to their office at [address] to pick up your prep and instructions.

## 2017-11-08 NOTE — Progress Notes (Signed)
Subjective:    Patient ID: [name], female    DOB: Jun 15, 1941, 77 y.o.   MRN: [id number]  HPI [name] is a pleasant 77 year old white female, established with Dr. Silverio [name] who was last seen in the office by myself in September 2017. 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 She had last attempt at colonoscopy in 2010 which was incomplete with inability to traverse. Virtual colonoscopy was done in 2015 which showed the colon to be not completely distensible and with severe left colon diverticulosis, there were no polyps Patient comes back in today after recent annual physical with Dr. Deland [name] with finding of a progressive iron deficiency anemia. She was asked to follow up with GI. Labs from 10/13/2017 showed hemoglobin of 9.7 hematocrit of 30.6 MCV of 77, serum iron 24 iron saturation of 6 TIBC 383, in January 2018 hemoglobin 11.1 hematocrit of 34.5 and in December 2017 hemoglobin 11.6 hematocrit of 36.3. Patient has not been on any aspirin or NSAIDs. She has just recently started a liquid iron formulation and has noted that her stools have been dark. Prior to that she had not noted any melena or hematochezia. She says she has had some recent chest discomfort which is been intermittent and some exertional shortness of breath as well as some dizziness at times with bending over. As the symptoms have been present over the past couple of months. She has seen her cardiologist Dr. Wynonia [name] and is scheduled for an echo later this week. Appetite has been fine, she denies any dysphagia, she does get occasional heartburn or indigestion. She has just started on Prilosec 20 mg by mouth every morning and says this is helping. She has no current complaints of abdominal pain. Her chronic diarrhea is about the same. She has good days and bad days. On a good  day she may have one to 2 fairly normal bowel movements on a bad day may have a couple of normal bowel movements followed by 3 or 4 episodes of loose stools. She takes Imodium when necessary and says is very helpful.  History is negative for colon cancer and polyps as far she is aware.   Review of Systems Pertinent positive and negative review of systems were noted in the above HPI section.  All other review of systems was otherwise negative.  Outpatient Encounter Medications as of 11/08/2017  Medication Sig  . b complex vitamins tablet Take 1 tablet by mouth daily.    . calcium carbonate (OS-CAL - DOSED IN MG OF ELEMENTAL CALCIUM) 1250 MG tablet Take 1 tablet by mouth daily.    . Cholecalciferol (VITAMIN D) 2000 UNITS tablet Take 2,000 Units by mouth daily.    Marland Kitchen dicyclomine (BENTYL) 20 MG tablet TAKE 1 TABLET (20 MG TOTAL) BY MOUTH 2 (TWO) TIMES DAILY.  Marland Kitchen glycopyrrolate (ROBINUL) 2 MG tablet Take 1 tablet (2 mg total) by mouth 2 (two) times daily.  Marland Kitchen ibandronate (BONIVA) 150 MG tablet Take 150 mg by mouth every 30 (thirty) days. Take in the morning with a full glass of water, on an empty stomach, and do not take anything else by mouth or lie down for the next 30 min.  Astrid Drafts Omega-3 300 MG CAPS Take 1 capsule by mouth daily.  Marland Kitchen loperamide (IMODIUM) 2 MG capsule Take 2 mg by mouth as needed. Prn diarrhea, otc   .  metoprolol succinate (TOPROL-XL) 25 MG 24 hr tablet Take 25 mg by mouth 2 (two) times daily.    . Multiple Vitamins-Minerals (MULTIVITAMINS THER. W/MINERALS) TABS Take 1 tablet by mouth daily.    Marland Kitchen omeprazole (PRILOSEC) 20 MG capsule Take 20 mg by mouth daily.  . polyethylene glycol (MIRALAX / GLYCOLAX) packet Take 17 g by mouth daily as needed.  . [DISCONTINUED] aspirin EC 81 MG tablet Take 81 mg by mouth daily.     No facility-administered encounter medications on file as of 11/08/2017.    Allergies  Allergen Reactions  . Codeine Other (See Comments)    Hyper   Patient Active  Problem List   Diagnosis Date Noted  . Cancer ([place])   . Enteritis 09/17/2011  . CANCER, ENDOMETRIUM 01/24/2009  . HYPERTENSION 01/24/2009  . DIVERTICULOSIS OF COLON 01/24/2009  . RECTAL BLEEDING 01/24/2009  . Diarrhea 01/24/2009  . HEMORRHOIDS, HX OF 01/24/2009   Social History   Socioeconomic History  . Marital status: Married    Spouse name: Not on file  . Number of children: 1  . Years of education: Not on file  . Highest education level: Not on file  Social Needs  . Financial resource strain: Not on file  . Food insecurity - worry: Not on file  . Food insecurity - inability: Not on file  . Transportation needs - medical: Not on file  . Transportation needs - non-medical: Not on file  Occupational History  . Occupation: Web designer  Tobacco Use  . Smoking status: Never Smoker  . Smokeless tobacco: Never Used  Substance and Sexual Activity  . Alcohol use: Yes    Alcohol/week: 0.0 oz    Comment: less then 2 a month  . Drug use: No  . Sexual activity: No  Other Topics Concern  . Not on file  Social History Narrative  . Not on file    [name]'s family history includes Breast cancer in her mother and sister; Diabetes in her maternal grandfather and maternal grandmother; Diabetes type II in her mother and sister; Leukemia in her father; Pancreatic cancer in her maternal aunt.      Objective:    Vitals:   11/08/17 0927  BP: 100/62  Pulse: 68    Physical Exam ; well-developed older white female in no acute distress, very pleasant blood pressure 100/62 pulse 68 height 5 foot 3, weight 161, BMI 27.8. 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 Patient referred back today for somewhat progressive iron deficiency anemia. She has documented heme positive today. Etiology of her iron deficiency anemia is not clear, this may be secondary to radiation enteritis and in part secondary to malabsorption Will rule out upper GI pathology i.e. chronic gastropathy. We'll also need to rule out occult colon lesion. Patient is not a candidate for colonoscopy with previous incomplete exam secondary to dense adhesions. Virtual colonoscopy in 2015 showed severe left colon diverticulosis and a poorly distensible colon .  Plan; patient will  be scheduled for upper endoscopy with Dr. Silverio [name]. Procedure discussed in detail with the patient including indications risks and benefits and she is agreeable to proceed She will continue omeprazole 20 mg by mouth every morning which she just started Repeat CBC today Continue oral iron supplementation which was just started, she'll need repeat iron studies in 3 months Patient will need repeat virtual colonoscopy, we will try to get this restarted prior to ordering says this was not covered by her insurance when previously done in 2015. Further plans pending results of above.  [name] Genia Harold PA-C 11/08/2017   Cc: Dawn Pretty, MD

## 2017-11-11 DIAGNOSIS — R0609 Other forms of dyspnea: Secondary | ICD-10-CM | POA: Diagnosis not present

## 2017-11-11 DIAGNOSIS — R079 Chest pain, unspecified: Secondary | ICD-10-CM | POA: Diagnosis not present

## 2017-11-11 DIAGNOSIS — I340 Nonrheumatic mitral (valve) insufficiency: Secondary | ICD-10-CM | POA: Diagnosis not present

## 2017-11-11 DIAGNOSIS — R0600 Dyspnea, unspecified: Secondary | ICD-10-CM | POA: Diagnosis not present

## 2017-11-11 DIAGNOSIS — I10 Essential (primary) hypertension: Secondary | ICD-10-CM | POA: Diagnosis not present

## 2017-11-14 ENCOUNTER — Encounter: Payer: Self-pay | Admitting: Gastroenterology

## 2017-11-14 NOTE — Progress Notes (Signed)
Reviewed and agree with documentation and assessment and plan. K. Veena Wassim Kirksey , MD   

## 2017-11-28 ENCOUNTER — Ambulatory Visit (AMBULATORY_SURGERY_CENTER): Payer: PPO | Admitting: Gastroenterology

## 2017-11-28 ENCOUNTER — Other Ambulatory Visit: Payer: Self-pay

## 2017-11-28 ENCOUNTER — Encounter: Payer: Self-pay | Admitting: Gastroenterology

## 2017-11-28 VITALS — BP 113/70 | HR 74 | Temp 98.2°F | Resp 15

## 2017-11-28 DIAGNOSIS — K2990 Gastroduodenitis, unspecified, without bleeding: Secondary | ICD-10-CM | POA: Diagnosis not present

## 2017-11-28 DIAGNOSIS — K2971 Gastritis, unspecified, with bleeding: Secondary | ICD-10-CM

## 2017-11-28 DIAGNOSIS — I10 Essential (primary) hypertension: Secondary | ICD-10-CM | POA: Diagnosis not present

## 2017-11-28 DIAGNOSIS — K259 Gastric ulcer, unspecified as acute or chronic, without hemorrhage or perforation: Secondary | ICD-10-CM

## 2017-11-28 DIAGNOSIS — D509 Iron deficiency anemia, unspecified: Secondary | ICD-10-CM | POA: Diagnosis not present

## 2017-11-28 DIAGNOSIS — K2970 Gastritis, unspecified, without bleeding: Secondary | ICD-10-CM

## 2017-11-28 DIAGNOSIS — R1310 Dysphagia, unspecified: Secondary | ICD-10-CM | POA: Diagnosis not present

## 2017-11-28 DIAGNOSIS — K2950 Unspecified chronic gastritis without bleeding: Secondary | ICD-10-CM | POA: Diagnosis not present

## 2017-11-28 MED ORDER — SODIUM CHLORIDE 0.9 % IV SOLN: 500.0000 mL | Freq: Once | INTRAVENOUS | Status: DC

## 2017-11-28 NOTE — Patient Instructions (Signed)
*  Handouts given to patient on gastritis*  YOU HAD AN ENDOSCOPIC PROCEDURE TODAY AT [facility]:   Refer to the procedure report that was given to you for any specific questions about what was found during the examination.  If the procedure report does not answer your questions, please call your gastroenterologist to clarify.  If you requested that your care partner not be given the details of your procedure findings, then the procedure report has been included in a sealed envelope for you to review at your convenience later.  YOU SHOULD EXPECT: Some feelings of bloating in the abdomen. Passage of more gas than usual.  Walking can help get rid of the air that was put into your GI tract during the procedure and reduce the bloating. If you had a lower endoscopy (such as a colonoscopy or flexible sigmoidoscopy) you may notice spotting of blood in your stool or on the toilet paper. If you underwent a bowel prep for your procedure, you may not have a normal bowel movement for a few days.  Please Note:  You might notice some irritation and congestion in your nose or some drainage.  This is from the oxygen used during your procedure.  There is no need for concern and it should clear up in a day or so.  SYMPTOMS TO REPORT IMMEDIATELY:    Following upper endoscopy (EGD)  Vomiting of blood or coffee ground material  New chest pain or pain under the shoulder blades  Painful or persistently difficult swallowing  New shortness of breath  Fever of 100F or higher  Black, tarry-looking stools  For urgent or emergent issues, a gastroenterologist can be reached at any hour by calling [phone].   DIET:  We do recommend a small meal at first, but then you may proceed to your regular diet.  Drink plenty of fluids but you should avoid alcoholic beverages for 24 hours.  ACTIVITY:  You should plan to take it easy for the rest of today and you should NOT DRIVE or use heavy machinery until  tomorrow (because of the sedation medicines used during the test).    FOLLOW UP: Our staff will call the number listed on your records the next business day following your procedure to check on you and address any questions or concerns that you may have regarding the information given to you following your procedure. If we do not reach you, we will leave a message.  However, if you are feeling well and you are not experiencing any problems, there is no need to return our call.  We will assume that you have returned to your regular daily activities without incident.  If any biopsies were taken you will be contacted by phone or by letter within the next 1-3 weeks.  Please call us at [phone] if you have not heard about the biopsies in 3 weeks.    SIGNATURES/CONFIDENTIALITY: You and/or your care partner have signed paperwork which will be entered into your electronic medical record.  These signatures attest to the fact that that the information above on your After Visit Summary has been reviewed and is understood.  Full responsibility of the confidentiality of this discharge information lies with you and/or your care-partner.

## 2017-11-28 NOTE — Progress Notes (Signed)
Called to room to assist during endoscopic procedure.  Patient ID and intended procedure confirmed with present staff. Received instructions for my participation in the procedure from the performing physician.  

## 2017-11-28 NOTE — Op Note (Signed)
[facility] Patient Name: [name] Procedure Date: 11/28/2017 10:07 AM MRN: [id number] Endoscopist: Mauri Pole , MD Age: 77 Referring MD:  Date of Birth: 1941-07-14 Gender: Female Account #: [id number] Procedure:                Upper GI endoscopy Indications:              Gastrointestinal bleeding of unknown origin,                            Suspected upper gastrointestinal bleeding in                            patient with unexplained iron deficiency anemia Medicines:                Monitored Anesthesia Care Procedure:                Pre-Anesthesia Assessment:                           - Prior to the procedure, a History and Physical                            was performed, and patient medications and                            allergies were reviewed. The patient's tolerance of                            previous anesthesia was also reviewed. The risks                            and benefits of the procedure and the sedation                            options and risks were discussed with the patient.                            All questions were answered, and informed consent                            was obtained. Prior Anticoagulants: The patient has                            taken no previous anticoagulant or antiplatelet                            agents. ASA Grade Assessment: II - A patient with                            mild systemic disease. After reviewing the risks                            and benefits, the patient was deemed in  satisfactory condition to undergo the procedure.                           After obtaining informed consent, the endoscope was                            passed under direct vision. Throughout the                            procedure, the patient's blood pressure, pulse, and                            oxygen saturations were monitored continuously. The                            Endoscope was  introduced through the mouth, and                            advanced to the second part of duodenum. The upper                            GI endoscopy was accomplished without difficulty.                            The patient tolerated the procedure well. Scope In: Scope Out: Findings:                 The esophagus was normal.                           Esophagogastric landmarks were identified: the                            Z-line was found at 38 cm and the gastroesophageal                            junction was found at 40 cm from the incisors.                           Patchy moderate inflammation with hemorrhage                            characterized by congestion (edema), erosions,                            erythema and granularity was found in the gastric                            antrum. Biopsies were taken with a cold forceps for                            Helicobacter pylori testing.                           The examined duodenum was normal. Complications:  No immediate complications. Estimated Blood Loss:     Estimated blood loss was minimal. Impression:               - Normal esophagus.                           - Esophagogastric landmarks identified.                           - Gastritis with hemorrhage. Biopsied.                           - Normal examined duodenum. Recommendation:           - Patient has a contact number available for                            emergencies. The signs and symptoms of potential                            delayed complications were discussed with the                            patient. Return to normal activities tomorrow.                            Written discharge instructions were provided to the                            patient.                           - Resume previous diet.                           - Continue present medications.                           - Await pathology results. Mauri Pole,  MD 11/28/2017 10:26:56 AM This report has been signed electronically.

## 2017-11-28 NOTE — Progress Notes (Signed)
Report given to PACU, vss 

## 2017-11-28 NOTE — Progress Notes (Signed)
I have reviewed the patient's medical history in detail and updated the computerized patient record.

## 2017-11-29 ENCOUNTER — Telehealth: Payer: Self-pay

## 2017-11-29 NOTE — Telephone Encounter (Signed)
  Follow up Call-  Call back number 11/28/2017  Post procedure Call Back phone  # [phone]  Permission to leave phone message Yes  Some recent data might be hidden     Patient questions:  Do you have a fever, pain , or abdominal swelling? No. Pain Score  0 *  Have you tolerated food without any problems? Yes.    Have you been able to return to your normal activities? Yes.    Do you have any questions about your discharge instructions: Diet   No. Medications  No. Follow up visit  No.  Do you have questions or concerns about your Care? No.  Actions: * If pain score is 4 or above: No action needed, pain <4.

## 2017-12-05 ENCOUNTER — Encounter: Payer: Self-pay | Admitting: Gastroenterology

## 2017-12-06 NOTE — Telephone Encounter (Deleted)
Closed encounter °

## 2017-12-07 ENCOUNTER — Other Ambulatory Visit: Payer: PPO

## 2017-12-15 DIAGNOSIS — D509 Iron deficiency anemia, unspecified: Secondary | ICD-10-CM | POA: Diagnosis not present

## 2017-12-20 DIAGNOSIS — I10 Essential (primary) hypertension: Secondary | ICD-10-CM | POA: Diagnosis not present

## 2017-12-20 DIAGNOSIS — R011 Cardiac murmur, unspecified: Secondary | ICD-10-CM | POA: Diagnosis not present

## 2017-12-20 DIAGNOSIS — D509 Iron deficiency anemia, unspecified: Secondary | ICD-10-CM | POA: Diagnosis not present

## 2017-12-27 ENCOUNTER — Other Ambulatory Visit: Payer: PPO

## 2018-01-04 ENCOUNTER — Ambulatory Visit
Admission: RE | Admit: 2018-01-04 | Discharge: 2018-01-04 | Disposition: A | Discharge status: Home / Self Care | Payer: PPO | Source: Ambulatory Visit | Attending: Physician Assistant | Admitting: Physician Assistant

## 2018-01-04 DIAGNOSIS — K565 Intestinal adhesions [bands], unspecified as to partial versus complete obstruction: Secondary | ICD-10-CM

## 2018-01-04 DIAGNOSIS — K520 Gastroenteritis and colitis due to radiation: Secondary | ICD-10-CM

## 2018-01-04 DIAGNOSIS — R1013 Epigastric pain: Secondary | ICD-10-CM

## 2018-01-04 DIAGNOSIS — K5730 Diverticulosis of large intestine without perforation or abscess without bleeding: Secondary | ICD-10-CM | POA: Diagnosis not present

## 2018-01-04 DIAGNOSIS — D509 Iron deficiency anemia, unspecified: Secondary | ICD-10-CM

## 2018-01-04 DIAGNOSIS — R195 Other fecal abnormalities: Secondary | ICD-10-CM

## 2018-01-13 ENCOUNTER — Telehealth: Payer: Self-pay | Admitting: Physician Assistant

## 2018-01-13 DIAGNOSIS — I340 Nonrheumatic mitral (valve) insufficiency: Secondary | ICD-10-CM | POA: Diagnosis not present

## 2018-01-13 DIAGNOSIS — R079 Chest pain, unspecified: Secondary | ICD-10-CM | POA: Diagnosis not present

## 2018-01-13 DIAGNOSIS — R0600 Dyspnea, unspecified: Secondary | ICD-10-CM | POA: Diagnosis not present

## 2018-01-13 DIAGNOSIS — I10 Essential (primary) hypertension: Secondary | ICD-10-CM | POA: Diagnosis not present

## 2018-01-13 DIAGNOSIS — R0609 Other forms of dyspnea: Secondary | ICD-10-CM | POA: Diagnosis not present

## 2018-01-13 NOTE — Telephone Encounter (Signed)
Left message of normal report. Letter mailed on 01/10/18

## 2018-03-20 DIAGNOSIS — D509 Iron deficiency anemia, unspecified: Secondary | ICD-10-CM | POA: Diagnosis not present

## 2018-04-18 DIAGNOSIS — J0100 Acute maxillary sinusitis, unspecified: Secondary | ICD-10-CM | POA: Diagnosis not present

## 2018-04-18 DIAGNOSIS — R05 Cough: Secondary | ICD-10-CM | POA: Diagnosis not present

## 2018-04-25 ENCOUNTER — Other Ambulatory Visit: Payer: Self-pay | Admitting: Gastroenterology

## 2018-05-11 DIAGNOSIS — M7072 Other bursitis of hip, left hip: Secondary | ICD-10-CM | POA: Diagnosis not present

## 2018-05-11 DIAGNOSIS — I10 Essential (primary) hypertension: Secondary | ICD-10-CM | POA: Diagnosis not present

## 2018-06-06 DIAGNOSIS — D485 Neoplasm of uncertain behavior of skin: Secondary | ICD-10-CM | POA: Diagnosis not present

## 2018-06-06 DIAGNOSIS — B078 Other viral warts: Secondary | ICD-10-CM | POA: Diagnosis not present

## 2018-06-21 DIAGNOSIS — M542 Cervicalgia: Secondary | ICD-10-CM | POA: Diagnosis not present

## 2018-06-21 DIAGNOSIS — R42 Dizziness and giddiness: Secondary | ICD-10-CM | POA: Diagnosis not present

## 2018-07-04 DIAGNOSIS — Z124 Encounter for screening for malignant neoplasm of cervix: Secondary | ICD-10-CM | POA: Diagnosis not present

## 2018-07-04 DIAGNOSIS — Z1231 Encounter for screening mammogram for malignant neoplasm of breast: Secondary | ICD-10-CM | POA: Diagnosis not present

## 2018-07-04 DIAGNOSIS — Z6829 Body mass index (BMI) 29.0-29.9, adult: Secondary | ICD-10-CM | POA: Diagnosis not present

## 2018-07-17 DIAGNOSIS — R062 Wheezing: Secondary | ICD-10-CM | POA: Diagnosis not present

## 2018-07-17 DIAGNOSIS — J0100 Acute maxillary sinusitis, unspecified: Secondary | ICD-10-CM | POA: Diagnosis not present

## 2018-09-21 DIAGNOSIS — B078 Other viral warts: Secondary | ICD-10-CM | POA: Diagnosis not present

## 2018-09-21 DIAGNOSIS — L300 Nummular dermatitis: Secondary | ICD-10-CM | POA: Diagnosis not present

## 2018-10-23 DIAGNOSIS — I10 Essential (primary) hypertension: Secondary | ICD-10-CM | POA: Diagnosis not present

## 2018-10-26 DIAGNOSIS — M8580 Other specified disorders of bone density and structure, unspecified site: Secondary | ICD-10-CM | POA: Diagnosis not present

## 2018-10-26 DIAGNOSIS — L219 Seborrheic dermatitis, unspecified: Secondary | ICD-10-CM | POA: Diagnosis not present

## 2018-10-26 DIAGNOSIS — Z0000 Encounter for general adult medical examination without abnormal findings: Secondary | ICD-10-CM | POA: Diagnosis not present

## 2018-10-26 DIAGNOSIS — I10 Essential (primary) hypertension: Secondary | ICD-10-CM | POA: Diagnosis not present

## 2018-10-26 DIAGNOSIS — I340 Nonrheumatic mitral (valve) insufficiency: Secondary | ICD-10-CM | POA: Diagnosis not present

## 2018-10-26 DIAGNOSIS — R600 Localized edema: Secondary | ICD-10-CM | POA: Diagnosis not present

## 2018-10-26 DIAGNOSIS — D509 Iron deficiency anemia, unspecified: Secondary | ICD-10-CM | POA: Diagnosis not present

## 2018-11-06 ENCOUNTER — Telehealth: Payer: Self-pay | Admitting: Hematology and Oncology

## 2018-11-06 ENCOUNTER — Encounter: Payer: Self-pay | Admitting: Hematology and Oncology

## 2018-11-06 NOTE — Telephone Encounter (Signed)
Received a new referral from Dr. Shelia Media for microcytic anemia. Pt has been cld and scheduled to see Dr. [name] Adie on 1/8 at 1pm. Pt aware to arrive 30 minutes early.

## 2018-11-07 NOTE — Progress Notes (Signed)
[name] CONSULT NOTE  Patient Care Team: Deland Pretty, MD as PCP - General (Internal Medicine)  CHIEF COMPLAINTS/PURPOSE OF CONSULTATION: Microcytic Anemia  HISTORY OF PRESENTING ILLNESS:  [name] 78 y.o. female is here because of recent diagnosis of microcytic anemia. She was referred by Dr. Shelia Media. Her most recent lab work from 10/23/18 shows Hg 8.9, RDW 15.7, and platelets 226. She presents to the clinic alone today. She reports she had low Hg last year and was put on liquid iron but she couldn't tolerate it. She has not been on any treatment since 04/2018 and reports fatigue and cravings for ice. She has a history of endometrial cancer in 2009. Her last endoscopy was 11/28/17 and her last colonoscopy was 01/04/18. Her dad and nephew have a history of leukemia. She has not had recent stool testing. She is on an antacid. She works in Interior and spatial designer. She reviewed her medication list with me.   I reviewed her records extensively and collaborated the history with the patient.  REVIEW OF SYSTEMS:   Constitutional: Denies fevers, chills or abnormal night sweats (+) fatigue (+) cravings for ice  Eyes: Denies blurriness of vision, double vision or watery eyes Ears, nose, mouth, throat, and face: Denies mucositis or sore throat Respiratory: Denies cough, dyspnea or wheezes Cardiovascular: Denies palpitation, chest discomfort or lower extremity swelling Gastrointestinal:  Denies nausea, heartburn or change in bowel habits Skin: Denies abnormal skin rashes Lymphatics: Denies new lymphadenopathy or easy bruising Neurological: Denies numbness, tingling or new weaknesses Behavioral/Psych: Mood is stable, no new changes  All other systems were reviewed with the patient and are negative.  MEDICAL HISTORY:  Past Medical History:  Diagnosis Date  . Diverticulosis of colon   . Endometrial cancer ([place])    1A grade endometrial ca  . Hx of radiation therapy 04/24/08-05/29/08&  8/12/,8/19,&06/27/08   external beam  through 7/09 ,then intracavity in 06/2008  . Hypertension     SURGICAL HISTORY: Past Surgical History:  Procedure Laterality Date  . APPENDECTOMY     1967  . [place]  . LAPAROSCOPIC HYSTERECTOMY     total  . ORTHOPEDIC SURGERY     Orthoscopic Knee Surgery  . TEAR DUCT PROBING    . TOOTH EXTRACTION     bone graft    SOCIAL HISTORY: Social History   Socioeconomic History  . Marital status: Married    Spouse name: Not on file  . Number of children: 1  . Years of education: Not on file  . Highest education level: Not on file  Occupational History  . Occupation: Web designer  Social Needs  . Financial resource strain: Not on file  . Food insecurity:    Worry: Not on file    Inability: Not on file  . Transportation needs:    Medical: Not on file    Non-medical: Not on file  Tobacco Use  . Smoking status: Never Smoker  . Smokeless tobacco: Never Used  Substance and Sexual Activity  . Alcohol use: Yes    Alcohol/week: 0.0 standard drinks    Comment: less then 2 a month  . Drug use: No  . Sexual activity: Never  Lifestyle  . Physical activity:    Days per week: Not on file    Minutes per session: Not on file  . Stress: Not on file  Relationships  . Social connections:    Talks on phone: Not on file    Gets  together: Not on file    Attends religious service: Not on file    Active member of club or organization: Not on file    Attends meetings of clubs or organizations: Not on file    Relationship status: Not on file  . Intimate partner violence:    Fear of current or ex partner: Not on file    Emotionally abused: Not on file    Physically abused: Not on file    Forced sexual activity: Not on file  Other Topics Concern  . Not on file  Social History Narrative  . Not on file    FAMILY HISTORY: Family History  Problem Relation Age of Onset  . Diabetes type II Mother   . Breast cancer  Mother   . Leukemia Father   . Diabetes type II Sister   . Breast cancer Sister   . Pancreatic cancer Maternal Aunt        x2  . Diabetes Maternal Grandmother   . Diabetes Maternal Grandfather   . Colon cancer Neg Hx     ALLERGIES:  is allergic to codeine.  MEDICATIONS:  Current Outpatient Medications  Medication Sig Dispense Refill  . b complex vitamins tablet Take 1 tablet by mouth daily.      . calcium carbonate (OS-CAL - DOSED IN MG OF ELEMENTAL CALCIUM) 1250 MG tablet Take 1 tablet by mouth daily.      . Cholecalciferol (VITAMIN D) 2000 UNITS tablet Take 2,000 Units by mouth daily.      Marland Kitchen dicyclomine (BENTYL) 20 MG tablet TAKE 1 TABLET BY MOUTH TWICE A DAY 180 tablet 2  . glycopyrrolate (ROBINUL) 2 MG tablet Take 1 tablet (2 mg total) by mouth 2 (two) times daily. 30 tablet 1  . ibandronate (BONIVA) 150 MG tablet Take 150 mg by mouth every 30 (thirty) days. Take in the morning with a full glass of water, on an empty stomach, and do not take anything else by mouth or lie down for the next 30 min.    Astrid Drafts Omega-3 300 MG CAPS Take 1 capsule by mouth daily.    Marland Kitchen loperamide (IMODIUM) 2 MG capsule Take 2 mg by mouth as needed. Prn diarrhea, otc     . metoprolol succinate (TOPROL-XL) 25 MG 24 hr tablet Take 25 mg by mouth 2 (two) times daily.      . Multiple Vitamins-Minerals (MULTIVITAMINS THER. W/MINERALS) TABS Take 1 tablet by mouth daily.      Marland Kitchen omeprazole (PRILOSEC) 20 MG capsule Take 20 mg by mouth daily.    . polyethylene glycol (MIRALAX / GLYCOLAX) packet Take 17 g by mouth daily as needed.     No current facility-administered medications for this visit.    PHYSICAL EXAMINATION: ECOG PERFORMANCE STATUS: 1 - Symptomatic but completely ambulatory  Vitals:   11/08/18 1258  BP: (!) 156/87  Pulse: (!) 116  Resp: 18  Temp: 98.9 F (37.2 C)  SpO2: 99%   Filed Weights   11/08/18 1258  Weight: 164 lb 3.2 oz (74.5 kg)    GENERAL:alert, no distress and  comfortable SKIN: skin color, texture, turgor are normal, no rashes or significant lesions EYES: normal, conjunctiva are pink and non-injected, sclera clear OROPHARYNX:no exudate, no erythema and lips, buccal mucosa, and tongue normal  NECK: supple, thyroid normal size, non-tender, without nodularity LYMPH:  no palpable lymphadenopathy in the cervical, axillary or inguinal LUNGS: clear to auscultation and percussion with normal breathing effort HEART: regular rate &  rhythm and no murmurs and no lower extremity edema ABDOMEN:abdomen soft, non-tender and normal bowel sounds Musculoskeletal:no cyanosis of digits and no clubbing  PSYCH: alert & oriented x 3 with fluent speech NEURO: no focal motor/sensory deficits  LABORATORY DATA:  I have reviewed the data as listed Lab Results  Component Value Date   WBC 8.0 11/08/2017   HGB 11.8 (L) 11/08/2017   HCT 37.1 11/08/2017   MCV 81.3 11/08/2017   PLT 235.0 11/08/2017   Lab Results  Component Value Date   NA 138 09/19/2011   K 3.3 (L) 09/19/2011   CL 106 09/19/2011   CO2 25 09/19/2011    RADIOGRAPHIC STUDIES: I have personally reviewed the radiological reports and agreed with the findings in the report.  ASSESSMENT AND PLAN:    Iron deficiency anemia: Diagnosed in 2018 and underwent upper endoscopy and colonoscopy by Dr. Silverio Decamp.  6 months of oral iron therapy improved her hemoglobin  Lab review 10/23/2018: Hemoglobin 8.9, MCV 73   I counseled extensively regarding the different causes of iron deficiency including blood loss and malabsorption. Patient has had upper endoscopies and colonoscopies and did not have any clear identified source of blood loss.  It is possible that the patient may still have an occult source of bleeding.  However malabsorption is also a possibility. I recommended IV iron.  Recommendation: 1.  Recheck iron studies today 2. proceed with 2 doses of IV iron infusion with Feraheme starting this Friday,  11/10/2018  I discussed with the patient that potentially there may be a need for additional IV iron infusions if the iron levels were to remain low in the future. The frequency of need of IV iron would depend on many other factors including the rate of loss and by the degree of absorption.  Return to clinic in 3 months with recheck on iron studies and hemoglobin.     All questions were answered. The patient knows to call the clinic with any problems, questions or concerns.   Nicholas Lose, MD 11/08/2018   I, Cloyde Reams Dorshimer, am acting as scribe for Nicholas Lose, MD.  I have reviewed the above documentation for accuracy and completeness, and I agree with the above.

## 2018-11-08 ENCOUNTER — Telehealth: Payer: Self-pay | Admitting: Hematology and Oncology

## 2018-11-08 ENCOUNTER — Inpatient Hospital Stay: Payer: PPO

## 2018-11-08 ENCOUNTER — Inpatient Hospital Stay: Payer: PPO | Attending: Hematology and Oncology | Admitting: Hematology and Oncology

## 2018-11-08 VITALS — BP 156/87 | HR 116 | Temp 98.9°F | Resp 18 | Ht 63.0 in | Wt 164.2 lb

## 2018-11-08 DIAGNOSIS — Z806 Family history of leukemia: Secondary | ICD-10-CM

## 2018-11-08 DIAGNOSIS — Z8542 Personal history of malignant neoplasm of other parts of uterus: Secondary | ICD-10-CM

## 2018-11-08 DIAGNOSIS — D509 Iron deficiency anemia, unspecified: Secondary | ICD-10-CM | POA: Insufficient documentation

## 2018-11-08 DIAGNOSIS — Z923 Personal history of irradiation: Secondary | ICD-10-CM | POA: Diagnosis not present

## 2018-11-08 LAB — FERRITIN: Ferritin: 6 ng/mL — ABNORMAL LOW (ref 11–307)

## 2018-11-08 LAB — CBC WITH DIFFERENTIAL (CANCER CENTER ONLY)
Abs Immature Granulocytes: 0.04 10*3/uL (ref 0.00–0.07)
Basophils Absolute: 0 10*3/uL (ref 0.0–0.1)
Basophils Relative: 1 %
Eosinophils Absolute: 0.1 10*3/uL (ref 0.0–0.5)
Eosinophils Relative: 2 %
HCT: 30.9 % — ABNORMAL LOW (ref 36.0–46.0)
Hemoglobin: 9.3 g/dL — ABNORMAL LOW (ref 12.0–15.0)
Immature Granulocytes: 1 %
Lymphocytes Relative: 16 %
Lymphs Abs: 1.4 10*3/uL (ref 0.7–4.0)
MCH: 21.9 pg — ABNORMAL LOW (ref 26.0–34.0)
MCHC: 30.1 g/dL (ref 30.0–36.0)
MCV: 72.7 fL — ABNORMAL LOW (ref 80.0–100.0)
Monocytes Absolute: 1 10*3/uL (ref 0.1–1.0)
Monocytes Relative: 12 %
Neutro Abs: 6.1 10*3/uL (ref 1.7–7.7)
Neutrophils Relative %: 68 %
Platelet Count: 264 10*3/uL (ref 150–400)
RBC: 4.25 MIL/uL (ref 3.87–5.11)
RDW: 15.7 % — ABNORMAL HIGH (ref 11.5–15.5)
WBC Count: 8.7 10*3/uL (ref 4.0–10.5)
nRBC: 0 % (ref 0.0–0.2)

## 2018-11-08 LAB — IRON AND TIBC
Iron: 11 ug/dL — ABNORMAL LOW (ref 41–142)
Saturation Ratios: 3 % — ABNORMAL LOW (ref 21–57)
TIBC: 450 ug/dL — ABNORMAL HIGH (ref 236–444)
UIBC: 439 ug/dL — ABNORMAL HIGH (ref 120–384)

## 2018-11-08 NOTE — Assessment & Plan Note (Addendum)
Iron deficiency anemia: Diagnosed in 2018 and underwent upper endoscopy and colonoscopy by Dr. Silverio Decamp.  6 months of oral iron therapy improved her hemoglobin  Lab review 10/23/2018: Hemoglobin 8.9, MCV 73   I counseled extensively regarding the different causes of iron deficiency including blood loss and malabsorption. Patient has had upper endoscopies and colonoscopies and did not have any clear identified source of blood loss.  It is possible that the patient may still have an occult source of bleeding.  However malabsorption is also a possibility. I recommended IV iron.  Recommendation: 1.  Recheck iron studies today 2. proceed with 2 doses of IV iron infusion with Feraheme starting this Friday, 11/10/2018  I discussed with the patient that potentially there may be a need for additional IV iron infusions if the iron levels were to remain low in the future. The frequency of need of IV iron would depend on many other factors including the rate of loss and by the degree of absorption.  Return to clinic in 3 months with recheck on iron studies and hemoglobin.

## 2018-11-08 NOTE — Telephone Encounter (Signed)
Gave avs and calendar ° °

## 2018-11-10 ENCOUNTER — Inpatient Hospital Stay: Payer: PPO

## 2018-11-10 VITALS — BP 110/63 | HR 66 | Temp 97.6°F | Resp 18

## 2018-11-10 DIAGNOSIS — D509 Iron deficiency anemia, unspecified: Secondary | ICD-10-CM | POA: Diagnosis not present

## 2018-11-10 MED ORDER — SODIUM CHLORIDE 0.9 % IV SOLN
510.0000 mg | Freq: Once | INTRAVENOUS | Status: AC
  Administered 2018-11-10: 510 mg via INTRAVENOUS
  Filled 2018-11-10: qty 17

## 2018-11-10 MED ORDER — SODIUM CHLORIDE 0.9 % IV SOLN
Freq: Once | INTRAVENOUS | Status: AC
  Administered 2018-11-10: 10:00:00 via INTRAVENOUS
  Filled 2018-11-10: qty 250

## 2018-11-10 NOTE — Patient Instructions (Signed)

## 2018-11-17 ENCOUNTER — Inpatient Hospital Stay: Payer: PPO

## 2018-11-17 VITALS — BP 124/67 | HR 91 | Temp 98.2°F | Resp 17

## 2018-11-17 DIAGNOSIS — D509 Iron deficiency anemia, unspecified: Secondary | ICD-10-CM

## 2018-11-17 MED ORDER — SODIUM CHLORIDE 0.9 % IV SOLN
510.0000 mg | Freq: Once | INTRAVENOUS | Status: AC
  Administered 2018-11-17: 510 mg via INTRAVENOUS
  Filled 2018-11-17: qty 17

## 2018-11-17 MED ORDER — SODIUM CHLORIDE 0.9 % IV SOLN
Freq: Once | INTRAVENOUS | Status: AC
  Administered 2018-11-17: 15:00:00 via INTRAVENOUS
  Filled 2018-11-17: qty 250

## 2018-11-17 NOTE — Patient Instructions (Signed)

## 2018-11-22 DIAGNOSIS — I10 Essential (primary) hypertension: Secondary | ICD-10-CM | POA: Diagnosis not present

## 2018-11-22 DIAGNOSIS — I340 Nonrheumatic mitral (valve) insufficiency: Secondary | ICD-10-CM | POA: Diagnosis not present

## 2018-11-23 DIAGNOSIS — I340 Nonrheumatic mitral (valve) insufficiency: Secondary | ICD-10-CM | POA: Diagnosis not present

## 2019-01-09 ENCOUNTER — Encounter: Payer: Self-pay | Admitting: Hematology and Oncology

## 2019-01-30 ENCOUNTER — Other Ambulatory Visit: Payer: Self-pay | Admitting: Gastroenterology

## 2019-02-01 ENCOUNTER — Ambulatory Visit: Payer: Self-pay | Admitting: Cardiology

## 2019-02-01 ENCOUNTER — Encounter: Payer: Self-pay | Admitting: Cardiology

## 2019-02-01 DIAGNOSIS — I340 Nonrheumatic mitral (valve) insufficiency: Secondary | ICD-10-CM

## 2019-02-01 DIAGNOSIS — D649 Anemia, unspecified: Secondary | ICD-10-CM | POA: Insufficient documentation

## 2019-02-01 HISTORY — DX: Nonrheumatic mitral (valve) insufficiency: I34.0

## 2019-02-01 NOTE — Progress Notes (Deleted)
Subjective:  Primary Physician:  Deland Pretty, MD  Patient ID: [name], female    DOB: 02-Sep-1941, 78 y.o.   MRN: [id number]  No chief complaint on file.   HPI: [name]  is a 78 y.o. female  with Essential hypertension, history of diverticulosis, endometrial cancer in 2009 treated with surgery and radiation therapy, previously followed Dr. 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 Lupe Carney and prior GI evaluation in January 2020 1990 revealed gastritis.  She is now referred to me to establish cardiac care and evaluation of valvular heart disease. She has chronic dyspnea on exertion.    Past Medical History:  Diagnosis Date  . Diverticulosis of colon   . Endometrial cancer ([place])    1A grade endometrial ca  . Essential hypertension 01/24/2009   Qualifier: Diagnosis of  By: Nelson-Smith CMA (AAMA), Dottie    . Hx of radiation therapy 04/24/08-05/29/08& 8/12/,8/19,&06/27/08   external beam  through 7/09 ,then intracavity in 06/2008  . Hypertension   . 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    Past Surgical History:  Procedure Laterality Date  . APPENDECTOMY     1967  . [place]  . LAPAROSCOPIC HYSTERECTOMY     total  . ORTHOPEDIC SURGERY     Orthoscopic Knee Surgery  . TEAR DUCT PROBING    . TOOTH EXTRACTION     bone graft    Social History   Socioeconomic History  . Marital status: Married    Spouse name: Not on file  . Number of children: 1  . Years of education: Not on file  . Highest education level: Not on file  Occupational History  . Occupation: Web designer  Social Needs  . Financial resource strain: Not on file  . Food insecurity:    Worry: Not on file    Inability: Not on file  . Transportation needs:    Medical: Not on file    Non-medical: Not on file   Tobacco Use  . Smoking status: Never Smoker  . Smokeless tobacco: Never Used  Substance and Sexual Activity  . Alcohol use: Yes    Alcohol/week: 0.0 standard drinks    Comment: less then 2 a month  . Drug use: No  . Sexual activity: Never  Lifestyle  . Physical activity:    Days per week: Not on file    Minutes per session: Not on file  . Stress: Not on file  Relationships  . Social connections:    Talks on phone: Not on file    Gets together: Not on file    Attends religious service: Not on file    Active member of club or organization: Not on file    Attends meetings of clubs or organizations: Not on file    Relationship status: Not on file  . Intimate partner violence:    Fear of current or ex partner: Not on file    Emotionally abused: Not on file    Physically abused: Not on file    Forced sexual activity: Not on file  Other Topics Concern  . Not on file  Social History Narrative  . Not on file    Current Outpatient Medications on File Prior to Visit  Medication Sig Dispense Refill  . b complex vitamins tablet Take 1 tablet  by mouth daily.      . calcium carbonate (OS-CAL - DOSED IN MG OF ELEMENTAL CALCIUM) 1250 MG tablet Take 1 tablet by mouth daily.      . Cholecalciferol (VITAMIN D) 2000 UNITS tablet Take 2,000 Units by mouth daily.      Marland Kitchen dicyclomine (BENTYL) 20 MG tablet TAKE 1 TABLET BY MOUTH TWICE A DAY 180 tablet 2  . glycopyrrolate (ROBINUL) 2 MG tablet Take 1 tablet (2 mg total) by mouth 2 (two) times daily. (Patient not taking: Reported on 11/08/2018) 30 tablet 1  . ibandronate (BONIVA) 150 MG tablet Take 150 mg by mouth every 30 (thirty) days. Take in the morning with a full glass of water, on an empty stomach, and do not take anything else by mouth or lie down for the next 30 min.    Astrid Drafts Omega-3 300 MG CAPS Take 1 capsule by mouth daily.    Marland Kitchen loperamide (IMODIUM) 2 MG capsule Take 2 mg by mouth as needed. Prn diarrhea, otc     . meclizine (ANTIVERT)  25 MG tablet Take 25 mg by mouth as needed for dizziness.    . metoprolol succinate (TOPROL-XL) 25 MG 24 hr tablet Take 25 mg by mouth 2 (two) times daily.      . Multiple Vitamins-Minerals (MULTIVITAMINS THER. W/MINERALS) TABS Take 1 tablet by mouth daily.      Marland Kitchen omeprazole (PRILOSEC) 20 MG capsule Take 20 mg by mouth daily.    . pantoprazole (PROTONIX) 40 MG tablet Take 40 mg by mouth daily.    . polyethylene glycol (MIRALAX / GLYCOLAX) packet Take 17 g by mouth daily as needed.    . triamterene-hydrochlorothiazide (MAXZIDE-25) 37.5-25 MG tablet Take 1 tablet by mouth daily.     No current facility-administered medications on file prior to visit.     ***ROS     Objective:  There were no vitals taken for this visit. There is no height or weight on file to calculate BMI.  ***Physical Exam  Radiology: No results found.  Laboratory Examination:  Labs 10/13/2017: Total cholesterol 157, triglycerides 117, HDL 41, LDL 93.  CMP Latest Ref Rng & Units 09/19/2011 09/18/2011 09/17/2011  Glucose 70 - 99 mg/dL 100(H) 101(H) 125(H)  BUN 6 - 23 mg/dL 8 11 13   Creatinine 0.50 - 1.10 mg/dL 0.73 0.78 0.83  Sodium 135 - 145 mEq/L 138 139 138  Potassium 3.5 - 5.1 mEq/L 3.3(L) 3.5 3.9  Chloride 96 - 112 mEq/L 106 106 103  CO2 19 - 32 mEq/L 25 26 27   Calcium 8.4 - 10.5 mg/dL 8.5 8.4 9.3  Total Protein 6.0 - 8.3 g/dL - 5.3(L) 6.1  Total Bilirubin 0.3 - 1.2 mg/dL - 0.8 0.9  Alkaline Phos 39 - 117 U/L - 47 54  AST 0 - 37 U/L - 12 15  ALT 0 - 35 U/L - 14 17   CBC Latest Ref Rng & Units 11/08/2018 11/08/2017 09/19/2011  WBC 4.0 - 10.5 K/uL 8.7 8.0 6.0  Hemoglobin 12.0 - 15.0 g/dL 9.3(L) 11.8(L) 11.6(L)  Hematocrit 36.0 - 46.0 % 30.9(L) 37.1 33.5(L)  Platelets 150 - 400 K/uL 264 235.0 155   Lipid Panel  No results found for: CHOL, TRIG, HDL, CHOLHDL, VLDL, LDLCALC, LDLDIRECT HEMOGLOBIN A1C No results found for: HGBA1C, MPG TSH No results for input(s): TSH in the last 8760 hours.  CARDIAC  STUDIES:   ***  Assessment:    Essential hypertension  Moderate mitral regurgitation  Iron deficiency  anemia due to chronic blood loss  *** Recommendation: ***   Adrian Prows, MD, [facility] 02/01/2019, 7:13 AM [facility] [place] Pager: [phone] Office: [phone] If no answer Cell [phone]

## 2019-02-02 ENCOUNTER — Other Ambulatory Visit: Payer: Self-pay

## 2019-02-02 ENCOUNTER — Ambulatory Visit (INDEPENDENT_AMBULATORY_CARE_PROVIDER_SITE_OTHER): Payer: PPO | Admitting: Cardiology

## 2019-02-02 ENCOUNTER — Encounter: Payer: Self-pay | Admitting: Cardiology

## 2019-02-02 VITALS — BP 126/59 | HR 62 | Ht 64.0 in | Wt 165.0 lb

## 2019-02-02 DIAGNOSIS — R0600 Dyspnea, unspecified: Secondary | ICD-10-CM

## 2019-02-02 DIAGNOSIS — I340 Nonrheumatic mitral (valve) insufficiency: Secondary | ICD-10-CM | POA: Diagnosis not present

## 2019-02-02 DIAGNOSIS — I700 Atherosclerosis of aorta: Secondary | ICD-10-CM | POA: Diagnosis not present

## 2019-02-02 DIAGNOSIS — I10 Essential (primary) hypertension: Secondary | ICD-10-CM | POA: Diagnosis not present

## 2019-02-02 DIAGNOSIS — R0609 Other forms of dyspnea: Secondary | ICD-10-CM

## 2019-02-02 DIAGNOSIS — D500 Iron deficiency anemia secondary to blood loss (chronic): Secondary | ICD-10-CM | POA: Diagnosis not present

## 2019-02-02 NOTE — Progress Notes (Signed)
Subjective:  Primary Physician:  Deland Pretty, MD  Patient ID: [name], female    DOB: Mar 09, 1941, 78 y.o.   MRN: [id number]  Chief Complaint  Patient presents with  . New Patient (Initial Visit)    valvular heart disease  . Shortness of Breath    HPI: [name]  is a 78 y.o. female  with Essential hypertension, history of diverticulosis, endometrial cancer in 2009 treated with surgery and radiation therapy, previously followed Dr. 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 Lupe Carney and prior GI evaluation in January 2020 revealed gastritis.    She is now referred to me to establish cardiac care and evaluation of valvular heart disease. She has chronic dyspnea on exertion. She has never smoked tobacco. No PND or orthopnea.  No claudication.   Past Medical History:  Diagnosis Date  . Diverticulosis of colon   . Endometrial cancer ([place])    1A grade endometrial ca  . Essential hypertension 01/24/2009   Qualifier: Diagnosis of  By: Nelson-Smith CMA (AAMA), Dottie    . Hx of radiation therapy 04/24/08-05/29/08& 8/12/,8/19,&06/27/08   external beam  through 7/09 ,then intracavity in 06/2008  . Hypertension   . 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    Past Surgical History:  Procedure Laterality Date  . APPENDECTOMY     1967  . [facility]  . LAPAROSCOPIC HYSTERECTOMY     total  . ORTHOPEDIC SURGERY     Orthoscopic Knee Surgery  . TEAR DUCT PROBING    . TOOTH EXTRACTION     bone graft    Social History   Socioeconomic History  . Marital status: Married    Spouse name: Not on file  . Number of children: 1  . Years of education: Not on file  . Highest education level: Not on file  Occupational History  . Occupation: Web designer  Social Needs  . Financial resource strain:  Not on file  . Food insecurity:    Worry: Not on file    Inability: Not on file  . Transportation needs:    Medical: Not on file    Non-medical: Not on file  Tobacco Use  . Smoking status: Never Smoker  . Smokeless tobacco: Never Used  Substance and Sexual Activity  . Alcohol use: Yes    Alcohol/week: 0.0 standard drinks    Comment: less then 2 a month  . Drug use: No  . Sexual activity: Never  Lifestyle  . Physical activity:    Days per week: Not on file    Minutes per session: Not on file  . Stress: Not on file  Relationships  . Social connections:    Talks on phone: Not on file    Gets together: Not on file    Attends religious service: Not on file    Active member of club or organization: Not on file    Attends meetings of clubs or organizations: Not on file    Relationship status: Not on file  . Intimate partner violence:    Fear of current or ex partner: Not on file    Emotionally abused: Not on file    Physically abused: Not on file    Forced sexual activity: Not on file  Other Topics Concern  . Not on file  Social History Narrative  . Not on file    Current Outpatient Medications on File Prior to Visit  Medication Sig Dispense Refill  . amoxicillin (AMOXIL) 500 MG capsule Take 500 mg by mouth 3 (three) times daily.    Marland Kitchen b complex vitamins tablet Take 1 tablet by mouth daily.      . calcium carbonate (OS-CAL - DOSED IN MG OF ELEMENTAL CALCIUM) 1250 MG tablet Take 1 tablet by mouth daily.      . Cholecalciferol (VITAMIN D) 2000 UNITS tablet Take 2,000 Units by mouth daily.      Marland Kitchen dicyclomine (BENTYL) 20 MG tablet TAKE 1 TABLET BY MOUTH TWICE A DAY 180 tablet 2  . ibandronate (BONIVA) 150 MG tablet Take 150 mg by mouth every 30 (thirty) days. Take in the morning with a full glass of water, on an empty stomach, and do not take anything else by mouth or lie down for the next 30 min.    Astrid Drafts Omega-3 300 MG CAPS Take 1 capsule by mouth daily.    Marland Kitchen loperamide  (IMODIUM) 2 MG capsule Take 2 mg by mouth as needed. Prn diarrhea, otc     . meclizine (ANTIVERT) 25 MG tablet Take 25 mg by mouth as needed for dizziness.    . metoprolol succinate (TOPROL-XL) 25 MG 24 hr tablet Take 25 mg by mouth 2 (two) times daily.      . Multiple Vitamins-Minerals (MULTIVITAMINS THER. W/MINERALS) TABS Take 1 tablet by mouth daily.      Marland Kitchen omeprazole (PRILOSEC) 20 MG capsule Take 20 mg by mouth daily.    . pantoprazole (PROTONIX) 40 MG tablet Take 40 mg by mouth daily.    Marland Kitchen triamterene-hydrochlorothiazide (MAXZIDE-25) 37.5-25 MG tablet Take 1 tablet by mouth daily.    Marland Kitchen glycopyrrolate (ROBINUL) 2 MG tablet Take 1 tablet (2 mg total) by mouth 2 (two) times daily. (Patient not taking: Reported on 11/08/2018) 30 tablet 1  . polyethylene glycol (MIRALAX / GLYCOLAX) packet Take 17 g by mouth daily as needed.     No current facility-administered medications on file prior to visit.     Review of Systems  Constitution: Negative for chills, decreased appetite, malaise/fatigue and weight gain.  Cardiovascular: Positive for dyspnea on exertion. Negative for leg swelling, near-syncope, orthopnea and syncope.  Endocrine: Negative for cold intolerance.  Hematologic/Lymphatic: Does not bruise/bleed easily.  Musculoskeletal: Negative for joint swelling.  Gastrointestinal: Positive for diarrhea (h/o radiation proctatitis). Negative for abdominal pain, anorexia and change in bowel habit.  Neurological: Negative for headaches and light-headedness.  Psychiatric/Behavioral: Negative for depression and substance abuse.  All other systems reviewed and are negative.     Objective:  Blood pressure (!) 126/59, pulse 62, height 5\' 4"  (1.626 m), weight 165 lb (74.8 kg), SpO2 97 %. Body mass index is 28.32 kg/m.  Physical Exam  Constitutional: She appears well-developed and well-nourished. No distress.  HENT:  Head: Atraumatic.  Eyes: Conjunctivae are normal.  Neck: Neck supple. No JVD  present. No thyromegaly present.  Cardiovascular: Normal rate, regular rhythm, S1 normal, S2 normal and intact distal pulses. Exam reveals no gallop.  Murmur heard. High-pitched blowing mid to late systolic murmur is present with a grade of 2/6 at the apex. Pulmonary/Chest: Effort normal and breath sounds normal.  Abdominal: Soft. Bowel sounds are normal.  Musculoskeletal: Normal range of motion.        General: No edema.  Neurological: She is alert.  Skin: Skin is warm and  dry.  Psychiatric: She has a normal mood and affect.    Radiology: No results found.  Laboratory Examination:  Labs 10/13/2017: Total cholesterol 157, triglycerides 117, HDL 41, LDL 93.  CMP Latest Ref Rng & Units 09/19/2011 09/18/2011 09/17/2011  Glucose 70 - 99 mg/dL 100(H) 101(H) 125(H)  BUN 6 - 23 mg/dL 8 11 13   Creatinine 0.50 - 1.10 mg/dL 0.73 0.78 0.83  Sodium 135 - 145 mEq/L 138 139 138  Potassium 3.5 - 5.1 mEq/L 3.3(L) 3.5 3.9  Chloride 96 - 112 mEq/L 106 106 103  CO2 19 - 32 mEq/L 25 26 27   Calcium 8.4 - 10.5 mg/dL 8.5 8.4 9.3  Total Protein 6.0 - 8.3 g/dL - 5.3(L) 6.1  Total Bilirubin 0.3 - 1.2 mg/dL - 0.8 0.9  Alkaline Phos 39 - 117 U/L - 47 54  AST 0 - 37 U/L - 12 15  ALT 0 - 35 U/L - 14 17   CBC Latest Ref Rng & Units 11/08/2018 11/08/2017 09/19/2011  WBC 4.0 - 10.5 K/uL 8.7 8.0 6.0  Hemoglobin 12.0 - 15.0 g/dL 9.3(L) 11.8(L) 11.6(L)  Hematocrit 36.0 - 46.0 % 30.9(L) 37.1 33.5(L)  Platelets 150 - 400 K/uL 264 235.0 155   Lipid Panel  No results found for: CHOL, TRIG, HDL, CHOLHDL, VLDL, LDLCALC, LDLDIRECT HEMOGLOBIN A1C No results found for: HGBA1C, MPG TSH No results for input(s): TSH in the last 8760 hours.  CARDIAC STUDIES:   Echocardiogram performed at PCPs office 11/23/2018:  Normal LV systolic function at 65 to 70% with normal LV size and LV thickness.  Normal diastolic function.  Left atrium is mild to moderately dilated with left atrial diameter of 4.8 cm and volume of 36 mL.   Right atrium is mild to moderately dilated.  Mild mitral regurgitation.  Moderate tricuspid regurgitation, RV systolic pressure estimated at 38 mmHg.  CT colonoscopy 01/04/2018: Mild colonic diverticulosis. No fixed polypoid filling defects or annular constricting lesions. No acute extra colonic abnormality in the abdomen or pelvis. Aortic atherosclerosis.  Chest x-ray 12/30/11/2016: Negative two view chest x-ray   Assessment:    Moderate mitral regurgitation  Dyspnea on exertion - Plan: PCV CARDIAC STRESS TEST  Abdominal aortic atherosclerosis (HCC)  Essential hypertension  Iron deficiency anemia due to chronic blood loss  EKG 11/11/18 1900 by Dr. Wynonia Lawman: Normal sinus rhythm.  Recommendation:  Patient is referred to me to establish cardiac care, previously following Dr. Tollie Eth.  I reviewed the results of the recently performed echocardiogram and also her records from Dr. Thurman Coyer office including his workup  And EKG interpretation only  Valvular heart disease is probably nonspecific and is remained stable over the past 3 years.  No endocarditis prophylaxis is indicated.  She is a nonsmoker. Blood pressure is well controlled on present medical therapy.  Although she has had chronic dyspnea, probably related to chronic anemia and age appropriate deconditioning, coronary artery disease need to be excluded.  She does have aortic atherosclerosis noted on CT of the abdomen, there is no abdominal aortic aneurysm.  I'll schedule her for a routine treadmill exercise stress test.  I do not have a recent lipids, I would have a low threshold to start her on a high intensity small dose statin in view of abdominal atherosclerosis.  Unless treadmill stress test abnormal, I'll see her back in a year.  I have discussed with patient regarding the safety during COVID Pandemic and steps and precautions to be taken including social distancing, frequent hand wash  and use of detergent soap, gels  with the patient. I asked the patient to avoid touching mouth, nose, eyes, ears with her hands. I encouraged regular walking around the neighborhood and exercise and regular diet, as long as social distancing can be maintained.  Adrian Prows, MD, [facility] 02/02/2019, 2:35 PM [facility] [place] Pager: [phone] Office: [phone] If no answer Cell [phone]

## 2019-02-06 ENCOUNTER — Telehealth: Payer: Self-pay | Admitting: Hematology and Oncology

## 2019-02-06 NOTE — Telephone Encounter (Signed)
Patient reports that she is feeling more tired than usual so we will keep her appointment with labs and follow-up to see me.

## 2019-02-12 ENCOUNTER — Other Ambulatory Visit: Payer: Self-pay

## 2019-02-12 ENCOUNTER — Inpatient Hospital Stay: Payer: PPO | Attending: Hematology and Oncology

## 2019-02-12 DIAGNOSIS — D509 Iron deficiency anemia, unspecified: Secondary | ICD-10-CM | POA: Diagnosis not present

## 2019-02-12 LAB — CBC WITH DIFFERENTIAL (CANCER CENTER ONLY)
Abs Immature Granulocytes: 0.04 10*3/uL (ref 0.00–0.07)
Basophils Absolute: 0 10*3/uL (ref 0.0–0.1)
Basophils Relative: 0 %
Eosinophils Absolute: 0.2 10*3/uL (ref 0.0–0.5)
Eosinophils Relative: 2 %
HCT: 41.7 % (ref 36.0–46.0)
Hemoglobin: 13.5 g/dL (ref 12.0–15.0)
Immature Granulocytes: 0 %
Lymphocytes Relative: 13 %
Lymphs Abs: 1.3 10*3/uL (ref 0.7–4.0)
MCH: 28.7 pg (ref 26.0–34.0)
MCHC: 32.4 g/dL (ref 30.0–36.0)
MCV: 88.7 fL (ref 80.0–100.0)
Monocytes Absolute: 1.1 10*3/uL — ABNORMAL HIGH (ref 0.1–1.0)
Monocytes Relative: 12 %
Neutro Abs: 7 10*3/uL (ref 1.7–7.7)
Neutrophils Relative %: 73 %
Platelet Count: 215 10*3/uL (ref 150–400)
RBC: 4.7 MIL/uL (ref 3.87–5.11)
RDW: 17.3 % — ABNORMAL HIGH (ref 11.5–15.5)
WBC Count: 9.7 10*3/uL (ref 4.0–10.5)
nRBC: 0 % (ref 0.0–0.2)

## 2019-02-12 LAB — FERRITIN: Ferritin: 14 ng/mL (ref 11–307)

## 2019-02-12 LAB — IRON AND TIBC
Iron: 107 ug/dL (ref 41–142)
Saturation Ratios: 31 % (ref 21–57)
TIBC: 348 ug/dL (ref 236–444)
UIBC: 241 ug/dL (ref 120–384)

## 2019-02-13 NOTE — Progress Notes (Signed)
Patient Care Team: Deland Pretty, MD as PCP - General (Internal Medicine)  DIAGNOSIS:    ICD-10-CM   1. Iron deficiency anemia due to chronic blood loss D50.0     CHIEF COMPLIANT: Iron deficiency anemia  INTERVAL HISTORY: [name] is a 78 y.o. with above-mentioned history of iron deficiency anemia who is currently receiving IV iron treatment. Her lab work from 02/12/19 showed Hg 13.5, ferritin 14, and iron saturation 31%. She presents to the clinic alone today to review her lab work.  She did extremely well after her iron infusion.  Her dizziness went away after 2 weeks after the infusion.  REVIEW OF SYSTEMS:   Constitutional: Denies fevers, chills or abnormal weight loss Eyes: Denies blurriness of vision Ears, nose, mouth, throat, and face: Denies mucositis or sore throat Respiratory: Denies cough, dyspnea or wheezes Cardiovascular: Denies palpitation, chest discomfort Gastrointestinal: Denies nausea, heartburn or change in bowel habits Skin: Denies abnormal skin rashes Lymphatics: Denies new lymphadenopathy or easy bruising Neurological: Denies numbness, tingling or new weaknesses Behavioral/Psych: Mood is stable, no new changes  Extremities: No lower extremity edema Breast: denies any pain or lumps or nodules in either breasts All other systems were reviewed with the patient and are negative.  I have reviewed the past medical history, past surgical history, social history and family history with the patient and they are unchanged from previous note.  ALLERGIES:  is allergic to codeine.  MEDICATIONS:  Current Outpatient Medications  Medication Sig Dispense Refill  . amoxicillin (AMOXIL) 500 MG capsule Take 500 mg by mouth 3 (three) times daily.    Marland Kitchen b complex vitamins tablet Take 1 tablet by mouth daily.      . calcium carbonate (OS-CAL - DOSED IN MG OF ELEMENTAL CALCIUM) 1250 MG tablet Take 1 tablet by mouth daily.      . Cholecalciferol (VITAMIN D) 2000 UNITS tablet  Take 2,000 Units by mouth daily.      Marland Kitchen dicyclomine (BENTYL) 20 MG tablet TAKE 1 TABLET BY MOUTH TWICE A DAY 180 tablet 2  . glycopyrrolate (ROBINUL) 2 MG tablet Take 1 tablet (2 mg total) by mouth 2 (two) times daily. (Patient not taking: Reported on 11/08/2018) 30 tablet 1  . ibandronate (BONIVA) 150 MG tablet Take 150 mg by mouth every 30 (thirty) days. Take in the morning with a full glass of water, on an empty stomach, and do not take anything else by mouth or lie down for the next 30 min.    Astrid Drafts Omega-3 300 MG CAPS Take 1 capsule by mouth daily.    Marland Kitchen loperamide (IMODIUM) 2 MG capsule Take 2 mg by mouth as needed. Prn diarrhea, otc     . meclizine (ANTIVERT) 25 MG tablet Take 25 mg by mouth as needed for dizziness.    . metoprolol succinate (TOPROL-XL) 25 MG 24 hr tablet Take 25 mg by mouth 2 (two) times daily.      . Multiple Vitamins-Minerals (MULTIVITAMINS THER. W/MINERALS) TABS Take 1 tablet by mouth daily.      Marland Kitchen omeprazole (PRILOSEC) 20 MG capsule Take 20 mg by mouth daily.    . pantoprazole (PROTONIX) 40 MG tablet Take 40 mg by mouth daily.    . polyethylene glycol (MIRALAX / GLYCOLAX) packet Take 17 g by mouth daily as needed.    . triamterene-hydrochlorothiazide (MAXZIDE-25) 37.5-25 MG tablet Take 1 tablet by mouth daily.     No current facility-administered medications for this visit.  PHYSICAL EXAMINATION: ECOG PERFORMANCE STATUS: 0 - Asymptomatic  Vitals:   02/14/19 1409  BP: 133/63  Pulse: 68  Resp: 17  Temp: (!) 97.1 F (36.2 C)  SpO2: 98%   Filed Weights   02/14/19 1409  Weight: 165 lb 11.2 oz (75.2 kg)    GENERAL: alert, no distress and comfortable SKIN: skin color, texture, turgor are normal, no rashes or significant lesions EYES: normal, Conjunctiva are pink and non-injected, sclera clear OROPHARYNX: no exudate, no erythema and lips, buccal mucosa, and tongue normal  NECK: supple, thyroid normal size, non-tender, without nodularity LYMPH: no  palpable lymphadenopathy in the cervical, axillary or inguinal LUNGS: clear to auscultation and percussion with normal breathing effort HEART: regular rate & rhythm and no murmurs and no lower extremity edema ABDOMEN: abdomen soft, non-tender and normal bowel sounds MUSCULOSKELETAL: no cyanosis of digits and no clubbing  NEURO: alert & oriented x 3 with fluent speech, no focal motor/sensory deficits EXTREMITIES: No lower extremity edema  LABORATORY DATA:  I have reviewed the data as listed CMP Latest Ref Rng & Units 09/19/2011 09/18/2011 09/17/2011  Glucose 70 - 99 mg/dL 100(H) 101(H) 125(H)  BUN 6 - 23 mg/dL 8 11 13   Creatinine 0.50 - 1.10 mg/dL 0.73 0.78 0.83  Sodium 135 - 145 mEq/L 138 139 138  Potassium 3.5 - 5.1 mEq/L 3.3(L) 3.5 3.9  Chloride 96 - 112 mEq/L 106 106 103  CO2 19 - 32 mEq/L 25 26 27   Calcium 8.4 - 10.5 mg/dL 8.5 8.4 9.3  Total Protein 6.0 - 8.3 g/dL - 5.3(L) 6.1  Total Bilirubin 0.3 - 1.2 mg/dL - 0.8 0.9  Alkaline Phos 39 - 117 U/L - 47 54  AST 0 - 37 U/L - 12 15  ALT 0 - 35 U/L - 14 17    Lab Results  Component Value Date   WBC 9.7 02/12/2019   HGB 13.5 02/12/2019   HCT 41.7 02/12/2019   MCV 88.7 02/12/2019   PLT 215 02/12/2019   NEUTROABS 7.0 02/12/2019    ASSESSMENT & PLAN:  Iron deficiency anemia Diagnosed in 2018 and underwent upper endoscopy and colonoscopy by Dr. 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  Lab review 02/12/2019: Ferritin 14 improved from 6, iron saturation 31% improved from 3%, hemoglobin 13.5 improved from 9.3  Recommendation: No need for immediate IV iron treatment. Recheck blood work in 3 months and follow-up after that to determine if she needs additional IV iron treatment. Frequency of IV iron infusions would depend on her blood counts and how long the IV iron infusion can last.  It also  depends on the degree of absorption and any occult bleed.  We will see the patient back in 3 months with labs.  No orders of the defined types were placed in this encounter.  The patient has a good understanding of the overall plan. she agrees with it. she will call with any problems that may develop before the next visit here.  Nicholas Lose, MD 02/14/2019  Julious Oka Dorshimer am acting as scribe for Dr. Nicholas Lose.  I have reviewed the above documentation for accuracy and completeness, and I agree with the above.

## 2019-02-14 ENCOUNTER — Inpatient Hospital Stay (HOSPITAL_BASED_OUTPATIENT_CLINIC_OR_DEPARTMENT_OTHER): Payer: PPO | Admitting: Hematology and Oncology

## 2019-02-14 ENCOUNTER — Other Ambulatory Visit: Payer: Self-pay

## 2019-02-14 DIAGNOSIS — D500 Iron deficiency anemia secondary to blood loss (chronic): Secondary | ICD-10-CM

## 2019-02-14 NOTE — Assessment & Plan Note (Signed)
Diagnosed in 2018 and underwent upper endoscopy and colonoscopy by Dr. Silverio Decamp Differential diagnosis: Mild intermittent occult bleeding versus malabsorption  IV iron: January 2020  Lab review 02/12/2019: Ferritin 14 improved from 6, iron saturation 31% improved from 3%, hemoglobin 13.5 improved from 9.3  Recommendation: No need for immediate IV iron treatment. Recheck blood work in 3 months and follow-up after that to determine if she needs additional IV iron treatment. Frequency of IV iron infusions would depend on her blood counts and how long the IV iron infusion can last.  It also depends on the degree of absorption and any occult bleed.         Recommendation: 1.  Recheck iron studies today 2. proceed with 2 doses of IV iron infusion with Feraheme starting this Friday, 11/10/2018

## 2019-04-25 IMAGING — CR DG CHEST 2V
2 series · 2 of 2 positions shown · non-contrast
Comparison: Two-view chest x-ray a 02/23/2008

CLINICAL DATA: Chest pain for 1 month.

EXAM:
CHEST  2 VIEW

[w chest pa]
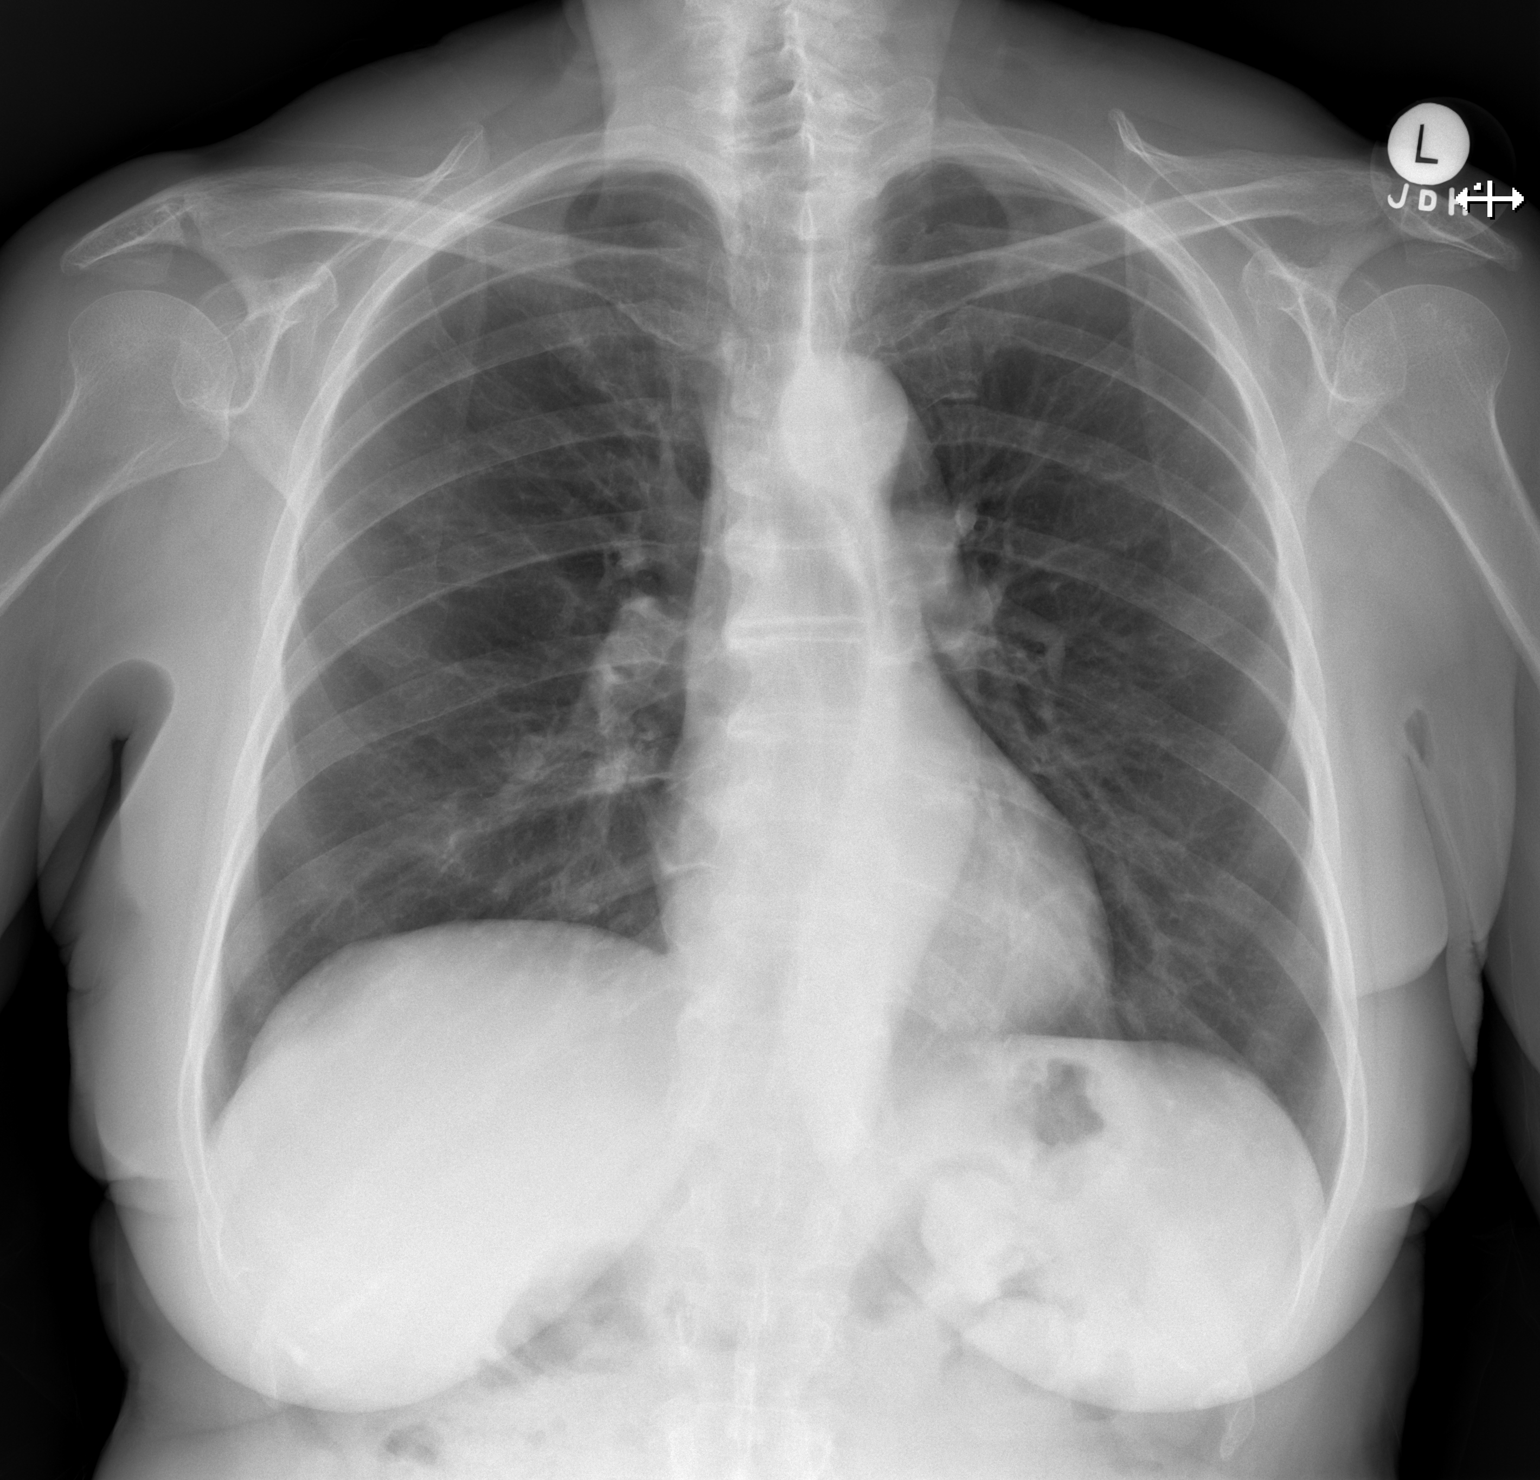

[w chest lat]
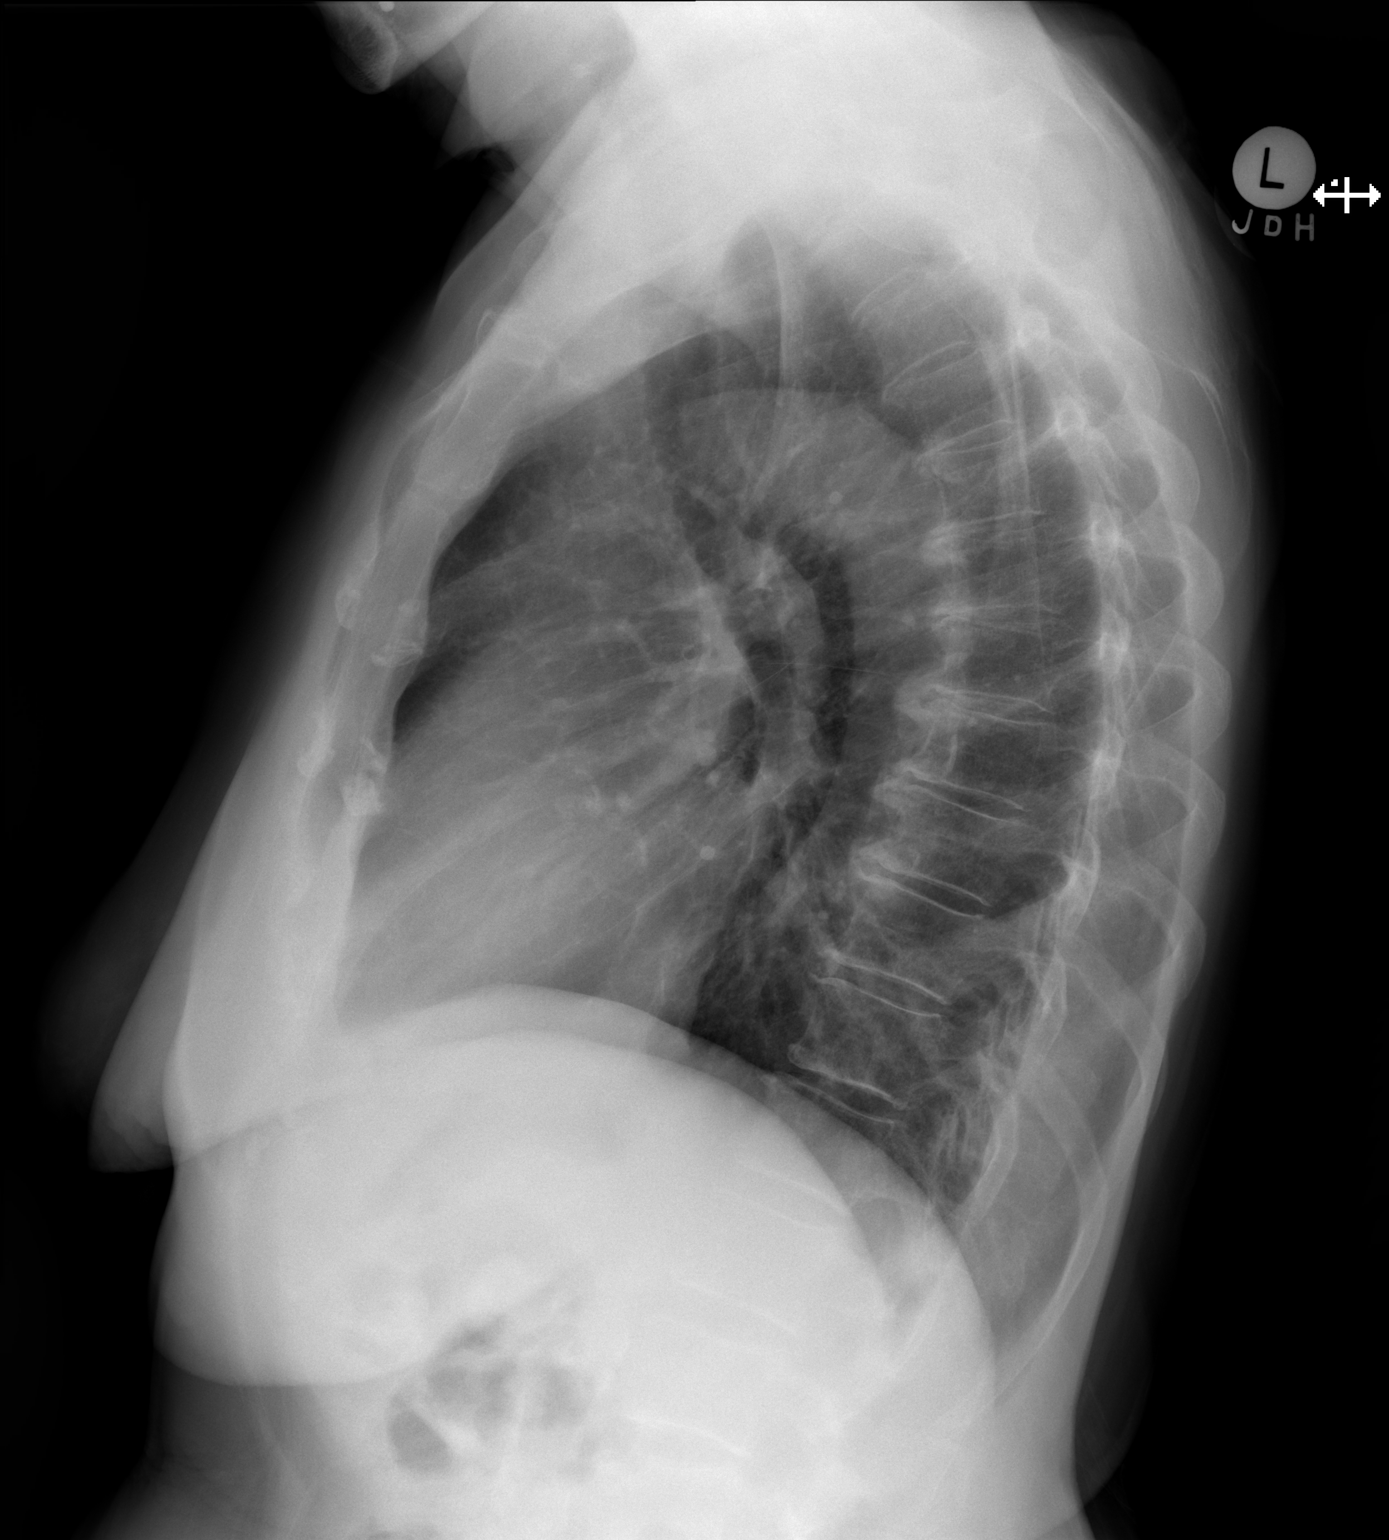

[2 of 2 positions shown; findings below may reference images not displayed]

FINDINGS: The heart size and mediastinal contours are within normal limits.
Both lungs are clear. The visualized skeletal structures are
unremarkable.
IMPRESSION: Negative two view chest x-ray

## 2019-05-14 ENCOUNTER — Inpatient Hospital Stay: Payer: PPO | Attending: Hematology and Oncology

## 2019-05-14 ENCOUNTER — Other Ambulatory Visit: Payer: Self-pay

## 2019-05-14 DIAGNOSIS — D500 Iron deficiency anemia secondary to blood loss (chronic): Secondary | ICD-10-CM | POA: Diagnosis not present

## 2019-05-14 LAB — CBC WITH DIFFERENTIAL (CANCER CENTER ONLY)
Abs Immature Granulocytes: 0.02 10*3/uL (ref 0.00–0.07)
Basophils Absolute: 0 10*3/uL (ref 0.0–0.1)
Basophils Relative: 0 %
Eosinophils Absolute: 0.1 10*3/uL (ref 0.0–0.5)
Eosinophils Relative: 2 %
HCT: 38.5 % (ref 36.0–46.0)
Hemoglobin: 12.3 g/dL (ref 12.0–15.0)
Immature Granulocytes: 0 %
Lymphocytes Relative: 18 %
Lymphs Abs: 1.3 10*3/uL (ref 0.7–4.0)
MCH: 29.4 pg (ref 26.0–34.0)
MCHC: 31.9 g/dL (ref 30.0–36.0)
MCV: 91.9 fL (ref 80.0–100.0)
Monocytes Absolute: 0.9 10*3/uL (ref 0.1–1.0)
Monocytes Relative: 13 %
Neutro Abs: 4.7 10*3/uL (ref 1.7–7.7)
Neutrophils Relative %: 67 %
Platelet Count: 192 10*3/uL (ref 150–400)
RBC: 4.19 MIL/uL (ref 3.87–5.11)
RDW: 13.8 % (ref 11.5–15.5)
WBC Count: 7.1 10*3/uL (ref 4.0–10.5)
nRBC: 0 % (ref 0.0–0.2)

## 2019-05-14 LAB — IRON AND TIBC
Iron: 78 ug/dL (ref 41–142)
Saturation Ratios: 19 % — ABNORMAL LOW (ref 21–57)
TIBC: 415 ug/dL (ref 236–444)
UIBC: 337 ug/dL (ref 120–384)

## 2019-05-14 LAB — FERRITIN: Ferritin: 13 ng/mL (ref 11–307)

## 2019-05-15 NOTE — Progress Notes (Signed)
Patient Care Team: Deland Pretty, MD as PCP - General (Internal Medicine)  DIAGNOSIS:    ICD-10-CM   1. Iron deficiency anemia due to chronic blood loss  D50.0     CHIEF COMPLIANT: Follow-up of iron deficiency anemia  INTERVAL HISTORY: [name] is a 78 y.o. with above-mentioned history of iron deficiency anemia treated with IV iron. Labs on 05/14/19 showed: Hg 12.3, HCT 38.5, MCV 91.9, iron saturation 19%, ferritin 13. She presents to the clinic today to review recent labs.   REVIEW OF SYSTEMS:   Constitutional: Denies fevers, chills or abnormal weight loss Eyes: Denies blurriness of vision Ears, nose, mouth, throat, and face: Denies mucositis or sore throat Respiratory: Denies cough, dyspnea or wheezes Cardiovascular: Denies palpitation, chest discomfort Gastrointestinal: Denies nausea, heartburn or change in bowel habits Skin: Denies abnormal skin rashes Lymphatics: Denies new lymphadenopathy or easy bruising Neurological: Denies numbness, tingling or new weaknesses Behavioral/Psych: Mood is stable, no new changes  Extremities: No lower extremity edema Breast: denies any pain or lumps or nodules in either breasts All other systems were reviewed with the patient and are negative.  I have reviewed the past medical history, past surgical history, social history and family history with the patient and they are unchanged from previous note.  ALLERGIES:  is allergic to codeine.  MEDICATIONS:  Current Outpatient Medications  Medication Sig Dispense Refill  . b complex vitamins tablet Take 1 tablet by mouth daily.      . calcium carbonate (OS-CAL - DOSED IN MG OF ELEMENTAL CALCIUM) 1250 MG tablet Take 1 tablet by mouth daily.      . Cholecalciferol (VITAMIN D) 2000 UNITS tablet Take 2,000 Units by mouth daily.      Marland Kitchen dicyclomine (BENTYL) 20 MG tablet TAKE 1 TABLET BY MOUTH TWICE A DAY 180 tablet 2  . glycopyrrolate (ROBINUL) 2 MG tablet Take 1 tablet (2 mg total) by mouth 2  (two) times daily. (Patient not taking: Reported on 11/08/2018) 30 tablet 1  . ibandronate (BONIVA) 150 MG tablet Take 150 mg by mouth every 30 (thirty) days. Take in the morning with a full glass of water, on an empty stomach, and do not take anything else by mouth or lie down for the next 30 min.    Astrid Drafts Omega-3 300 MG CAPS Take 1 capsule by mouth daily.    Marland Kitchen loperamide (IMODIUM) 2 MG capsule Take 2 mg by mouth as needed. Prn diarrhea, otc     . meclizine (ANTIVERT) 25 MG tablet Take 25 mg by mouth as needed for dizziness.    . metoprolol succinate (TOPROL-XL) 25 MG 24 hr tablet Take 25 mg by mouth 2 (two) times daily.      . Multiple Vitamins-Minerals (MULTIVITAMINS THER. W/MINERALS) TABS Take 1 tablet by mouth daily.      . pantoprazole (PROTONIX) 40 MG tablet Take 40 mg by mouth daily.    . polyethylene glycol (MIRALAX / GLYCOLAX) packet Take 17 g by mouth daily as needed.    . triamterene-hydrochlorothiazide (MAXZIDE-25) 37.5-25 MG tablet Take 1 tablet by mouth daily.     No current facility-administered medications for this visit.     PHYSICAL EXAMINATION: ECOG PERFORMANCE STATUS: 0 - Asymptomatic  Vitals:   05/16/19 1141  BP: 122/64  Pulse: (!) 58  Resp: 17  Temp: 98.6 F (37 C)  SpO2: 98%   Filed Weights   05/16/19 1141  Weight: 169 lb (76.7 kg)    GENERAL: alert,  no distress and comfortable SKIN: skin color, texture, turgor are normal, no rashes or significant lesions EYES: normal, Conjunctiva are pink and non-injected, sclera clear OROPHARYNX: no exudate, no erythema and lips, buccal mucosa, and tongue normal  NECK: supple, thyroid normal size, non-tender, without nodularity LYMPH: no palpable lymphadenopathy in the cervical, axillary or inguinal LUNGS: clear to auscultation and percussion with normal breathing effort HEART: regular rate & rhythm and no murmurs and no lower extremity edema ABDOMEN: abdomen soft, non-tender and normal bowel sounds  MUSCULOSKELETAL: no cyanosis of digits and no clubbing  NEURO: alert & oriented x 3 with fluent speech, no focal motor/sensory deficits EXTREMITIES: No lower extremity edema  LABORATORY DATA:  I have reviewed the data as listed CMP Latest Ref Rng & Units 09/19/2011 09/18/2011 09/17/2011  Glucose 70 - 99 mg/dL 100(H) 101(H) 125(H)  BUN 6 - 23 mg/dL 8 11 13   Creatinine 0.50 - 1.10 mg/dL 0.73 0.78 0.83  Sodium 135 - 145 mEq/L 138 139 138  Potassium 3.5 - 5.1 mEq/L 3.3(L) 3.5 3.9  Chloride 96 - 112 mEq/L 106 106 103  CO2 19 - 32 mEq/L 25 26 27   Calcium 8.4 - 10.5 mg/dL 8.5 8.4 9.3  Total Protein 6.0 - 8.3 g/dL - 5.3(L) 6.1  Total Bilirubin 0.3 - 1.2 mg/dL - 0.8 0.9  Alkaline Phos 39 - 117 U/L - 47 54  AST 0 - 37 U/L - 12 15  ALT 0 - 35 U/L - 14 17    Lab Results  Component Value Date   WBC 7.1 05/14/2019   HGB 12.3 05/14/2019   HCT 38.5 05/14/2019   MCV 91.9 05/14/2019   PLT 192 05/14/2019   NEUTROABS 4.7 05/14/2019    ASSESSMENT & PLAN:  Iron deficiency anemia Diagnosed in 2018 and underwent upper endoscopy and colonoscopy by Dr.Nandigam Differential diagnosis: Mild intermittent occult bleeding versus malabsorption  IV iron: January 2020 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  Lab review 02/12/2019: Ferritin 14 improved from 6, iron saturation 31% improved from 3%, hemoglobin 13.5 improved from 9.3 05/14/2019: Ferritin 13, iron saturation 19%, hemoglobin 12.3 Based on these lab results she does not need IV iron.  We will see her back in 6 months with labs done ahead of time and we will do a Doximity video visit for follow-up.    No orders of the defined types were placed in this encounter.  The patient has a good understanding of the overall plan. she agrees with it. she will call with any problems that may develop before the next visit here.  Nicholas Lose, MD 05/16/2019  Julious Oka Dorshimer am acting as scribe for Dr. Nicholas Lose.   I have reviewed the above documentation for accuracy and completeness, and I agree with the above.

## 2019-05-16 ENCOUNTER — Other Ambulatory Visit: Payer: Self-pay

## 2019-05-16 ENCOUNTER — Inpatient Hospital Stay (HOSPITAL_BASED_OUTPATIENT_CLINIC_OR_DEPARTMENT_OTHER): Payer: PPO | Admitting: Hematology and Oncology

## 2019-05-16 DIAGNOSIS — D500 Iron deficiency anemia secondary to blood loss (chronic): Secondary | ICD-10-CM

## 2019-05-16 NOTE — Assessment & Plan Note (Signed)
Diagnosed in 2018 and underwent upper endoscopy and colonoscopy by Dr.Nandigam Differential diagnosis: Mild intermittent occult bleeding versus malabsorption  IV iron: January 2020 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  Lab review 02/12/2019: Ferritin 14 improved from 6, iron saturation 31% improved from 3%, hemoglobin 13.5 improved from 9.3 05/14/2019: Ferritin 13, iron saturation 19%, hemoglobin 12.3 Based on these lab results she does not need IV iron.  We will see her back in 6 months with labs done ahead of time and we will do a Doximity video visit for follow-up.

## 2019-05-17 ENCOUNTER — Telehealth: Payer: Self-pay | Admitting: Hematology and Oncology

## 2019-05-17 NOTE — Telephone Encounter (Signed)
I left a message regarding schedule  

## 2019-07-18 DIAGNOSIS — Z6828 Body mass index (BMI) 28.0-28.9, adult: Secondary | ICD-10-CM | POA: Diagnosis not present

## 2019-07-18 DIAGNOSIS — Z01419 Encounter for gynecological examination (general) (routine) without abnormal findings: Secondary | ICD-10-CM | POA: Diagnosis not present

## 2019-07-18 DIAGNOSIS — N958 Other specified menopausal and perimenopausal disorders: Secondary | ICD-10-CM | POA: Diagnosis not present

## 2019-07-18 DIAGNOSIS — Z1231 Encounter for screening mammogram for malignant neoplasm of breast: Secondary | ICD-10-CM | POA: Diagnosis not present

## 2019-10-23 DIAGNOSIS — I10 Essential (primary) hypertension: Secondary | ICD-10-CM | POA: Diagnosis not present

## 2019-10-23 DIAGNOSIS — Z7982 Long term (current) use of aspirin: Secondary | ICD-10-CM | POA: Diagnosis not present

## 2019-10-30 DIAGNOSIS — Z7982 Long term (current) use of aspirin: Secondary | ICD-10-CM | POA: Diagnosis not present

## 2019-10-30 DIAGNOSIS — I340 Nonrheumatic mitral (valve) insufficiency: Secondary | ICD-10-CM | POA: Diagnosis not present

## 2019-10-30 DIAGNOSIS — I10 Essential (primary) hypertension: Secondary | ICD-10-CM | POA: Diagnosis not present

## 2019-10-30 DIAGNOSIS — M8580 Other specified disorders of bone density and structure, unspecified site: Secondary | ICD-10-CM | POA: Diagnosis not present

## 2019-10-30 DIAGNOSIS — Z0000 Encounter for general adult medical examination without abnormal findings: Secondary | ICD-10-CM | POA: Diagnosis not present

## 2019-10-30 DIAGNOSIS — I700 Atherosclerosis of aorta: Secondary | ICD-10-CM | POA: Diagnosis not present

## 2019-10-30 DIAGNOSIS — K529 Noninfective gastroenteritis and colitis, unspecified: Secondary | ICD-10-CM | POA: Diagnosis not present

## 2019-10-30 DIAGNOSIS — Z8542 Personal history of malignant neoplasm of other parts of uterus: Secondary | ICD-10-CM | POA: Diagnosis not present

## 2019-10-30 DIAGNOSIS — R0600 Dyspnea, unspecified: Secondary | ICD-10-CM | POA: Diagnosis not present

## 2019-10-30 DIAGNOSIS — Z0001 Encounter for general adult medical examination with abnormal findings: Secondary | ICD-10-CM | POA: Diagnosis not present

## 2019-10-30 DIAGNOSIS — D509 Iron deficiency anemia, unspecified: Secondary | ICD-10-CM | POA: Diagnosis not present

## 2019-11-05 ENCOUNTER — Other Ambulatory Visit: Payer: Self-pay | Admitting: Gastroenterology

## 2019-11-09 ENCOUNTER — Encounter: Payer: Self-pay | Admitting: Cardiology

## 2019-11-09 ENCOUNTER — Ambulatory Visit (INDEPENDENT_AMBULATORY_CARE_PROVIDER_SITE_OTHER): Payer: PPO | Admitting: Cardiology

## 2019-11-09 VITALS — BP 137/74 | HR 64 | Temp 97.8°F | Ht 64.0 in | Wt 169.0 lb

## 2019-11-09 DIAGNOSIS — I10 Essential (primary) hypertension: Secondary | ICD-10-CM

## 2019-11-09 DIAGNOSIS — I071 Rheumatic tricuspid insufficiency: Secondary | ICD-10-CM | POA: Diagnosis not present

## 2019-11-09 DIAGNOSIS — R0609 Other forms of dyspnea: Secondary | ICD-10-CM | POA: Diagnosis not present

## 2019-11-09 DIAGNOSIS — I340 Nonrheumatic mitral (valve) insufficiency: Secondary | ICD-10-CM

## 2019-11-09 DIAGNOSIS — I700 Atherosclerosis of aorta: Secondary | ICD-10-CM | POA: Diagnosis not present

## 2019-11-09 DIAGNOSIS — R0600 Dyspnea, unspecified: Secondary | ICD-10-CM

## 2019-11-09 NOTE — Progress Notes (Signed)
Primary Physician/Referring:  Deland Pretty, MD  Patient ID: [name], female    DOB: 02-08-1941, 79 y.o.   MRN: [id number]  Chief Complaint  Patient presents with  . Follow-up  . Shortness of Breath   HPI:    [place] SCREEN  is a 79 y.o. Caucasian female patient with Essential hypertension, history of diverticulosis, endometrial cancer in 2009 treated with surgery and radiation therapy, previously followed Dr. 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   Except for chronic dyspnea on exertion which she states is getting worse, no new complaints. She has never smoked. 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 She used to receive iron infusions and now being worked up by Dr. Sonny Dandy again for the same.  No PND or orthopnea.   Past Medical History:  Diagnosis Date  . Diverticulosis of colon   . Endometrial cancer ([place])    1A grade endometrial ca  . Essential hypertension 01/24/2009   Qualifier: Diagnosis of  By: Nelson-Smith CMA (AAMA), Dottie    . Hx of radiation therapy 04/24/08-05/29/08& 8/12/,8/19,&06/27/08   external beam  through 7/09 ,then intracavity in 06/2008  . Hypertension   . 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   Past Surgical History:  Procedure Laterality Date  . APPENDECTOMY     1967  . [place]  . LAPAROSCOPIC HYSTERECTOMY     total  . ORTHOPEDIC SURGERY     Orthoscopic Knee Surgery  . TEAR DUCT PROBING    . TOOTH EXTRACTION     bone graft   Social History   Tobacco Use  . Smoking status: Never Smoker  . Smokeless tobacco: Never Used  Substance Use Topics  . Alcohol use: Yes    Alcohol/week: 0.0 standard drinks    Comment: less then 2 a month   ROS  Review of Systems  Constitution: Positive for malaise/fatigue. Negative for weight gain.  Cardiovascular: Positive for dyspnea on exertion.  Negative for leg swelling and syncope.  Respiratory: Negative for hemoptysis.   Endocrine: Negative for cold intolerance.  Hematologic/Lymphatic: Does not bruise/bleed easily.  Gastrointestinal: Positive for diarrhea (chronic from radiation proctitis). Negative for hematochezia and melena.  Neurological: Positive for dizziness. Negative for headaches and light-headedness.   Objective  Blood pressure 137/74, pulse 64, temperature 97.8 F (36.6 C), height 5\' 4"  (1.626 m), weight 169 lb (76.7 kg), SpO2 97 %.  Vitals with BMI 11/09/2019 05/16/2019 02/14/2019  Height 5\' 4"  5\' 4"  5\' 4"   Weight 169 lbs 169 lbs 165 lbs 11 oz  BMI 28.99 [id number] [id number]  Systolic 0000000 [id number]  Diastolic 74 64 63  Pulse 64 58 68     Physical Exam  Constitutional:  She is moderately built and well nurished  HENT:  Head: Atraumatic.  Eyes: Conjunctivae are normal.  Cardiovascular: Normal rate, regular rhythm, S1 normal, S2 normal and intact distal pulses. Exam reveals no gallop.  Murmur heard.  Crescendo-decrescendo mid to late systolic murmur is present with a grade of 3/6 at the apex. Trace leg edema. Adipose tissue noted on legs. No JVD.   Pulmonary/Chest: Effort normal and breath sounds normal.  Abdominal: Soft. Bowel sounds are normal.  Skin: Skin is warm and dry.   Laboratory examination:   No results for input(s): NA, K, CL, CO2, GLUCOSE, BUN, CREATININE, CALCIUM, GFRNONAA, GFRAA in the last 8760  hours. CrCl cannot be calculated (Patient's most recent lab result is older than the maximum 21 days allowed.).  CMP Latest Ref Rng & Units 09/19/2011 09/18/2011 09/17/2011  Glucose 70 - 99 mg/dL 100(H) 101(H) 125(H)  BUN 6 - 23 mg/dL 8 11 13   Creatinine 0.50 - 1.10 mg/dL 0.73 0.78 0.83  Sodium 135 - 145 mEq/L 138 139 138  Potassium 3.5 - 5.1 mEq/L 3.3(L) 3.5 3.9  Chloride 96 - 112 mEq/L 106 106 103  CO2 19 - 32 mEq/L 25 26 27   Calcium 8.4 - 10.5 mg/dL 8.5 8.4 9.3  Total Protein 6.0 - 8.3 g/dL - 5.3(L) 6.1    Total Bilirubin 0.3 - 1.2 mg/dL - 0.8 0.9  Alkaline Phos 39 - 117 U/L - 47 54  AST 0 - 37 U/L - 12 15  ALT 0 - 35 U/L - 14 17   CBC Latest Ref Rng & Units 05/14/2019 02/12/2019 11/08/2018  WBC 4.0 - 10.5 K/uL 7.1 9.7 8.7  Hemoglobin 12.0 - 15.0 g/dL 12.3 13.5 9.3(L)  Hematocrit 36.0 - 46.0 % 38.5 41.7 30.9(L)  Platelets 150 - 400 K/uL 192 215 264   Lipid Panel  No results found for: CHOL, TRIG, HDL, CHOLHDL, VLDL, LDLCALC, LDLDIRECT HEMOGLOBIN A1C No results found for: HGBA1C, MPG TSH No results for input(s): TSH in the last 8760 hours.  Labs 10/13/2017: Total cholesterol 157, triglycerides 117, HDL 41, LDL 93.  Medications and allergies   Allergies  Allergen Reactions  . Codeine Other (See Comments)    Hyper    Current Outpatient Medications  Medication Instructions  . b complex vitamins tablet 1 tablet, Daily  . calcium carbonate (OS-CAL - DOSED IN MG OF ELEMENTAL CALCIUM) 1250 MG tablet 1 tablet, Daily  . dicyclomine (BENTYL) 20 MG tablet TAKE 1 TABLET BY MOUTH TWICE A DAY  . ferrous sulfate 325 mg, Oral, 3 times daily with meals  . glycopyrrolate (ROBINUL) 2 mg, Oral, 2 times daily  . HYDROcodone-acetaminophen (NORCO/VICODIN) 5-325 MG tablet 1 tablet, Oral, Every 4 hours PRN  . ibandronate (BONIVA) 150 mg, Oral, Every 30 days, Take in the morning with a full glass of water, on an empty stomach, and do not take anything else by mouth or lie down for the next 30 min.   Javier Docker Oil Omega-3 300 MG CAPS 1 capsule, Daily  . loperamide (IMODIUM) 2 mg, As needed  . meclizine (ANTIVERT) 25 mg, Oral, As needed  . metoprolol succinate (TOPROL-XL) 25 mg, 2 times daily  . Multiple Vitamins-Minerals (MULTIVITAMINS THER. W/MINERALS) TABS 1 tablet, Daily  . pantoprazole (PROTONIX) 40 mg, Oral, Daily  . polyethylene glycol (MIRALAX / GLYCOLAX) 17 g, Oral, Daily PRN  . rosuvastatin (CRESTOR) 5 mg, Oral, Daily  . triamterene-hydrochlorothiazide (MAXZIDE-25) 37.5-25 MG tablet 1 tablet,  Oral, Daily  . Vitamin D 2,000 Units, Daily  . Zinc 100 MG TABS Oral   Radiology:  No results found.  Cardiac Studies:   Chest x-ray 12/30/11/2016: Negative two view chest x-ray  CT colonoscopy 01/04/2018: Mild colonic diverticulosis. No fixed polypoid filling defects or annular constricting lesions. No acute extra colonic abnormality in the abdomen or pelvis. Aortic atherosclerosis.  Echocardiogram performed at PCPs office 11/23/2018:  Normal LV systolic function at 65 to 70% with normal LV size and LV thickness.  Normal diastolic function.  Left atrium is mild to moderately dilated with left atrial diameter of 4.8 cm and volume of 36 mL.  Right atrium is mild to moderately dilated.  Mild  mitral regurgitation.  Moderate tricuspid regurgitation, RV systolic pressure estimated at 38 mmHg.  Assessment     ICD-10-CM   1. Moderate mitral regurgitation  I34.0 EKG 12-Lead  2. Moderate tricuspid regurgitation  I07.1 EKG 12-Lead  3. Dyspnea on exertion  R06.00 EKG 12-Lead    PCV ECHOCARDIOGRAM COMPLETE    PCV MYOCARDIAL PERFUSION WITH LEXISCAN  4. Essential hypertension  I10 PCV ECHOCARDIOGRAM COMPLETE    PCV MYOCARDIAL PERFUSION WITH LEXISCAN  5. Abdominal aortic atherosclerosis ([place])  I70.0 PCV MYOCARDIAL PERFUSION WITH LEXISCAN    EKG 11/09/2019: Normal sinus rhythm at rate of 60 bpm, normal axis.  No evidence of ischemia.  Normal EKG.  Single PAC.   Recommendations:  No orders of the defined types were placed in this encounter.   CINDER CAMBRA  is a 80 y.o. Caucasian female patient with Essential hypertension, history of diverticulosis, endometrial cancer in 2009 treated with surgery and radiation therapy, previously followed Dr. 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   Chronic dyspnea on exertion related to probably iron deficiency anemia and deconditioning, although she does have moderate TR and mild pulmonary hypertension. Will repeat echo to  exclude progression of valvular heart disease. She was scheduled for treadmill stress test in Apr 2020, but due to Covid-19, never occurred. She does have abdominal aortic atherosclerosis and normal lipids in 2018.    Symptoms of dyspnea slightly worse with regards to dyspnea, mitral valve murmur also appears slightly more prominent, I'll set her up for a Lexiscan Myoview stress test and also repeat echocardiogram.  I'll see her back in 6 months unless the test revealed significant abnormality. Schedule for a Lexiscan Sestamibi stress test to evaluate for myocardial ischemia. Patient unable to do treadmill stress testing due to Covid 19 to avoid aerosol spread. Adrian Prows, MD, [facility] 11/09/2019, 4:56 PM [facility] [place] Office: [phone]

## 2019-11-12 ENCOUNTER — Ambulatory Visit (INDEPENDENT_AMBULATORY_CARE_PROVIDER_SITE_OTHER): Payer: PPO

## 2019-11-12 ENCOUNTER — Other Ambulatory Visit: Payer: Self-pay

## 2019-11-12 DIAGNOSIS — I10 Essential (primary) hypertension: Secondary | ICD-10-CM | POA: Diagnosis not present

## 2019-11-12 DIAGNOSIS — R0609 Other forms of dyspnea: Secondary | ICD-10-CM

## 2019-11-12 DIAGNOSIS — I700 Atherosclerosis of aorta: Secondary | ICD-10-CM | POA: Diagnosis not present

## 2019-11-12 DIAGNOSIS — R0600 Dyspnea, unspecified: Secondary | ICD-10-CM

## 2019-11-15 ENCOUNTER — Other Ambulatory Visit: Payer: Self-pay | Admitting: *Deleted

## 2019-11-15 DIAGNOSIS — D500 Iron deficiency anemia secondary to blood loss (chronic): Secondary | ICD-10-CM

## 2019-11-16 ENCOUNTER — Inpatient Hospital Stay: Payer: PPO | Attending: Hematology and Oncology

## 2019-11-16 ENCOUNTER — Other Ambulatory Visit: Payer: Self-pay

## 2019-11-16 DIAGNOSIS — D509 Iron deficiency anemia, unspecified: Secondary | ICD-10-CM | POA: Diagnosis not present

## 2019-11-16 DIAGNOSIS — D500 Iron deficiency anemia secondary to blood loss (chronic): Secondary | ICD-10-CM

## 2019-11-16 LAB — CBC WITH DIFFERENTIAL (CANCER CENTER ONLY)
Abs Immature Granulocytes: 0.03 10*3/uL (ref 0.00–0.07)
Basophils Absolute: 0 10*3/uL (ref 0.0–0.1)
Basophils Relative: 1 %
Eosinophils Absolute: 0.1 10*3/uL (ref 0.0–0.5)
Eosinophils Relative: 2 %
HCT: 38.9 % (ref 36.0–46.0)
Hemoglobin: 12.6 g/dL (ref 12.0–15.0)
Immature Granulocytes: 0 %
Lymphocytes Relative: 21 %
Lymphs Abs: 1.4 10*3/uL (ref 0.7–4.0)
MCH: 28.9 pg (ref 26.0–34.0)
MCHC: 32.4 g/dL (ref 30.0–36.0)
MCV: 89.2 fL (ref 80.0–100.0)
Monocytes Absolute: 0.9 10*3/uL (ref 0.1–1.0)
Monocytes Relative: 13 %
Neutro Abs: 4.3 10*3/uL (ref 1.7–7.7)
Neutrophils Relative %: 63 %
Platelet Count: 197 10*3/uL (ref 150–400)
RBC: 4.36 MIL/uL (ref 3.87–5.11)
RDW: 15.7 % — ABNORMAL HIGH (ref 11.5–15.5)
WBC Count: 6.8 10*3/uL (ref 4.0–10.5)
nRBC: 0 % (ref 0.0–0.2)

## 2019-11-16 LAB — IRON AND TIBC
Iron: 55 ug/dL (ref 41–142)
Saturation Ratios: 16 % — ABNORMAL LOW (ref 21–57)
TIBC: 346 ug/dL (ref 236–444)
UIBC: 291 ug/dL (ref 120–384)

## 2019-11-16 LAB — FERRITIN: Ferritin: 27 ng/mL (ref 11–307)

## 2019-11-19 NOTE — Progress Notes (Signed)
HEMATOLOGY-ONCOLOGY DOXIMITY VISIT PROGRESS NOTE  I connected with [name] on 11/20/2019 at 10:00 AM EST by Doximity video conference and verified that I am speaking with the correct person using two identifiers.  I discussed the limitations, risks, security and privacy concerns of performing an evaluation and management service by Doximity and the availability of in person appointments.  I also discussed with the patient that there may be a patient responsible charge related to this service. The patient expressed understanding and agreed to proceed.  Patient's Location: Home Physician Location: Clinic  CHIEF COMPLIANT: Follow-up of iron deficiency anemia  INTERVAL HISTORY: [name] is a 79 y.o. female with above-mentioned history of iron deficiency anemia treated with IV iron. Labs on 11/16/19 showed: Hg 12.6, HCT 38.9, MCV 89.2, iron saturation 16%, ferritin 27. She presents over Doximity today to review recent labs.   REVIEW OF SYSTEMS:   Constitutional: Denies fevers, chills or abnormal weight loss Eyes: Denies blurriness of vision Ears, nose, mouth, throat, and face: Denies mucositis or sore throat Respiratory: Denies cough, dyspnea or wheezes Cardiovascular: Denies palpitation, chest discomfort Gastrointestinal:  Denies nausea, heartburn or change in bowel habits Skin: Denies abnormal skin rashes Lymphatics: Denies new lymphadenopathy or easy bruising Neurological:Denies numbness, tingling or new weaknesses Behavioral/Psych: Mood is stable, no new changes  Extremities: No lower extremity edema Breast: denies any pain or lumps or nodules in either breasts All other systems were reviewed with the patient and are negative.  Observations/Objective:  There were no vitals filed for this visit. There is no height or weight on file to calculate BMI.  I have reviewed the data as listed CMP Latest Ref Rng & Units 09/19/2011 09/18/2011 09/17/2011  Glucose 70 - 99 mg/dL 100(H)  101(H) 125(H)  BUN 6 - 23 mg/dL 8 11 13   Creatinine 0.50 - 1.10 mg/dL 0.73 0.78 0.83  Sodium 135 - 145 mEq/L 138 139 138  Potassium 3.5 - 5.1 mEq/L 3.3(L) 3.5 3.9  Chloride 96 - 112 mEq/L 106 106 103  CO2 19 - 32 mEq/L 25 26 27   Calcium 8.4 - 10.5 mg/dL 8.5 8.4 9.3  Total Protein 6.0 - 8.3 g/dL - 5.3(L) 6.1  Total Bilirubin 0.3 - 1.2 mg/dL - 0.8 0.9  Alkaline Phos 39 - 117 U/L - 47 54  AST 0 - 37 U/L - 12 15  ALT 0 - 35 U/L - 14 17    Lab Results  Component Value Date   WBC 6.8 11/16/2019   HGB 12.6 11/16/2019   HCT 38.9 11/16/2019   MCV 89.2 11/16/2019   PLT 197 11/16/2019   NEUTROABS 4.3 11/16/2019      Assessment Plan:  Iron deficiency anemia Diagnosed in 2018 and underwent upper endoscopy and colonoscopy by Dr.Nandigam Differential diagnosis: Mild intermittent occult bleeding versus malabsorption IV iron: January 2020 Patient works for a affordable living [employer].  Lab review 05/14/2019: Ferritin 13, iron saturation 19%, hemoglobin 12.3 11/16/2019: Ferritin 27, iron saturation 16%, hemoglobin 12.6  Patient still does not need any IV iron therapy based on these labs. She could be seen on an as-needed basis.    I discussed the assessment and treatment plan with the patient. The patient was provided an opportunity to ask questions and all were answered. The patient agreed with the plan and demonstrated an understanding of the instructions. The patient was advised to call back or seek an in-person evaluation if the symptoms worsen or if the condition fails to improve as  anticipated.   I provided 15 minutes of face-to-face Doximity time during this encounter.    Rulon Eisenmenger, MD 11/20/2019   I, Molly Dorshimer, am acting as scribe for Nicholas Lose, MD.  I have reviewed the above documentation for accuracy and completeness, and I agree with the above.

## 2019-11-20 ENCOUNTER — Inpatient Hospital Stay (HOSPITAL_BASED_OUTPATIENT_CLINIC_OR_DEPARTMENT_OTHER): Payer: PPO | Admitting: Hematology and Oncology

## 2019-11-20 DIAGNOSIS — D500 Iron deficiency anemia secondary to blood loss (chronic): Secondary | ICD-10-CM

## 2019-11-20 NOTE — Assessment & Plan Note (Signed)
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  Lab review 05/14/2019: Ferritin 13, iron saturation 19%, hemoglobin 12.3 11/16/2019: Ferritin 27, iron saturation 16%, hemoglobin 12.6  Patient still does not need any IV iron therapy based on these labs. She could be seen on an as-needed basis.

## 2019-11-23 ENCOUNTER — Ambulatory Visit (INDEPENDENT_AMBULATORY_CARE_PROVIDER_SITE_OTHER): Payer: PPO

## 2019-11-23 ENCOUNTER — Other Ambulatory Visit: Payer: Self-pay

## 2019-11-23 DIAGNOSIS — R0609 Other forms of dyspnea: Secondary | ICD-10-CM | POA: Diagnosis not present

## 2019-11-23 DIAGNOSIS — I10 Essential (primary) hypertension: Secondary | ICD-10-CM

## 2019-11-23 DIAGNOSIS — R0600 Dyspnea, unspecified: Secondary | ICD-10-CM

## 2019-11-27 NOTE — Progress Notes (Signed)
Called pt no answer, left a vm to return call

## 2019-12-03 DIAGNOSIS — H35371 Puckering of macula, right eye: Secondary | ICD-10-CM | POA: Diagnosis not present

## 2019-12-03 DIAGNOSIS — H5203 Hypermetropia, bilateral: Secondary | ICD-10-CM | POA: Diagnosis not present

## 2019-12-03 DIAGNOSIS — H2513 Age-related nuclear cataract, bilateral: Secondary | ICD-10-CM | POA: Diagnosis not present

## 2019-12-10 ENCOUNTER — Encounter: Payer: Self-pay | Admitting: Gastroenterology

## 2019-12-10 ENCOUNTER — Ambulatory Visit (INDEPENDENT_AMBULATORY_CARE_PROVIDER_SITE_OTHER): Payer: PPO | Admitting: Gastroenterology

## 2019-12-10 VITALS — BP 112/66 | HR 64 | Temp 97.9°F | Ht 64.0 in | Wt 173.0 lb

## 2019-12-10 DIAGNOSIS — Z01818 Encounter for other preprocedural examination: Secondary | ICD-10-CM | POA: Diagnosis not present

## 2019-12-10 DIAGNOSIS — K580 Irritable bowel syndrome with diarrhea: Secondary | ICD-10-CM | POA: Diagnosis not present

## 2019-12-10 DIAGNOSIS — D508 Other iron deficiency anemias: Secondary | ICD-10-CM | POA: Diagnosis not present

## 2019-12-10 MED ORDER — NA SULFATE-K SULFATE-MG SULF 17.5-3.13-1.6 GM/177ML PO SOLN: ORAL | 0 refills | Status: DC

## 2019-12-10 NOTE — Patient Instructions (Signed)
If you are age 77 or older, your body mass index should be between 23-30. Your Body mass index is 29.7 kg/m. If this is out of the aforementioned range listed, please consider follow up with your Primary Care Provider.  If you are age 7 or younger, your body mass index should be between 19-25. Your Body mass index is 29.7 kg/m. If this is out of the aformentioned range listed, please consider follow up with your Primary Care Provider.    You have been scheduled for a colonoscopy. Please follow written instructions given to you at your visit today.  Please pick up your prep supplies at the pharmacy within the next 1-3 days. If you use inhalers (even only as needed), please bring them with you on the day of your procedure.   Use Imodium only as needed AVOID taking it frequently  Try blending/chopping vegetables  Chew salads well  Take Benefiber 1 tablespoon three times a day with meals start after Colonoscopy   Lactose-Free Diet, Adult If you have lactose intolerance, you are not able to digest lactose. Lactose is a natural sugar found mainly in dairy milk and dairy products. You may need to avoid all foods and beverages that contain lactose. A lactose-free diet can help you do this. Which foods have lactose? Lactose is found in dairy milk and dairy products, such as:  Yogurt.  Cheese.  Butter.  Margarine.  Sour cream.  Cream.  Whipped toppings and nondairy creamers.  Ice cream and other dairy-based desserts. Lactose is also found in foods or products made with dairy milk or milk ingredients. To find out whether a food contains dairy milk or a milk ingredient, look at the ingredients list. Avoid foods with the statement "May contain milk" and foods that contain:  Milk powder.  Whey.  Curd.  Caseinate.  Lactose.  Lactalbumin.  Lactoglobulin. What are alternatives to dairy milk and foods made with milk products?  Lactose-free milk.  Soy milk with added calcium  and vitamin D.  Almond milk, coconut milk, rice milk, or other nondairy milk alternatives with added calcium and vitamin D. Note that these are low in protein.  Soy products, such as soy yogurt, soy cheese, soy ice cream, and soy-based sour cream.  Other nut milk products, such as almond yogurt, almond cheese, cashew yogurt, cashew cheese, cashew ice cream, coconut yogurt, and coconut ice cream. What are tips for following this plan?  Do not consume foods, beverages, vitamins, minerals, or medicines containing lactose. Read ingredient lists carefully.  Look for the words "lactose-free" on labels.  Use lactase enzyme drops or tablets as directed by your health care provider.  Use lactose-free milk or a milk alternative, such as soy milk or almond milk, for drinking and cooking.  Make sure you get enough calcium and vitamin D in your diet. A lactose-free eating plan can be lacking in these important nutrients.  Take calcium and vitamin D supplements as directed by your health care provider. Talk to your health care provider about supplements if you are not able to get enough calcium and vitamin D from food. What foods can I eat?  Fruits All fresh, canned, frozen, or dried fruits that are not processed with lactose. Vegetables All fresh, frozen, and canned vegetables without cheese, cream, or butter sauces. Grains Any that are not made with dairy milk or dairy products. Meats and other proteins Any meat, fish, poultry, and other protein sources that are not made with dairy milk or dairy  products. Soy cheese and yogurt. Fats and oils Any that are not made with dairy milk or dairy products. Beverages Lactose-free milk. Soy, rice, or almond milk with added calcium and vitamin D. Fruit and vegetable juices. Sweets and desserts Any that are not made with dairy milk or dairy products. Seasonings and condiments Any that are not made with dairy milk or dairy products. Calcium Calcium is  found in many foods that contain lactose and is important for bone health. The amount of calcium you need depends on your age:  Adults younger than 50 years: 1,000 mg of calcium a day.  Adults older than 50 years: 1,200 mg of calcium a day. If you are not getting enough calcium, you may get it from other sources, including:  Orange juice with calcium added. There are 300-350 mg of calcium in 1 cup of orange juice.  Calcium-fortified soy milk. There are 300-400 mg of calcium in 1 cup of calcium-fortified soy milk.  Calcium-fortified rice or almond milk. There are 300 mg of calcium in 1 cup of calcium-fortified rice or almond milk.  Calcium-fortified breakfast cereals. There are 100-1,000 mg of calcium in calcium-fortified breakfast cereals.  Spinach, cooked. There are 145 mg of calcium in  cup of cooked spinach.  Edamame, cooked. There are 130 mg of calcium in  cup of cooked edamame.  Collard greens, cooked. There are 125 mg of calcium in  cup of cooked collard greens.  Kale, frozen or cooked. There are 90 mg of calcium in  cup of cooked or frozen kale.  Almonds. There are 95 mg of calcium in  cup of almonds.  Broccoli, cooked. There are 60 mg of calcium in 1 cup of cooked broccoli. The items listed above may not be a complete list of recommended foods and beverages. Contact a dietitian for more options. What foods are not recommended? Fruits None, unless they are made with dairy milk or dairy products. Vegetables None, unless they are made with dairy milk or dairy products. Grains Any grains that are made with dairy milk or dairy products. Meats and other proteins None, unless they are made with dairy milk or dairy products. Dairy All dairy products, including milk, goat's milk, buttermilk, kefir, acidophilus milk, flavored milk, evaporated milk, condensed milk, dulce de [place], eggnog, yogurt, cheese, and cheese spreads. Fats and oils Any that are made with milk or milk  products. Margarines and salad dressings that contain milk or cheese. Cream. Half and half. Cream cheese. Sour cream. Chip dips made with sour cream or yogurt. Beverages Hot chocolate. Cocoa with lactose. Instant iced teas. Powdered fruit drinks. Smoothies made with dairy milk or yogurt. Sweets and desserts Any that are made with milk or milk products. Seasonings and condiments Chewing gum that has lactose. Spice blends if they contain lactose. Artificial sweeteners that contain lactose. Nondairy creamers. The items listed above may not be a complete list of foods and beverages to avoid. Contact a dietitian for more information. Summary  If you are lactose intolerant, it means that you have a hard time digesting lactose, a natural sugar found in milk and milk products.  Following a lactose-free diet can help you manage this condition.  Calcium is important for bone health and is found in many foods that contain lactose. Talk with your health care provider about other sources of calcium. This information is not intended to replace advice given to you by your health care provider. Make sure you discuss any questions you have with  your health care provider. Document Revised: 11/15/2017 Document Reviewed: 11/15/2017 Elsevier Patient Education  [place].  I appreciate the  opportunity to care for you  Thank You   Harl Bowie , MD

## 2019-12-10 NOTE — Progress Notes (Signed)
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  Last seen by Nicoletta Ba in January 2019 for further evaluation of anemia  CBC Latest Ref Rng & Units 11/16/2019 05/14/2019 02/12/2019  WBC 4.0 - 10.5 K/uL 6.8 7.1 9.7  Hemoglobin 12.0 - 15.0 g/dL 12.6 12.3 13.5  Hematocrit 36.0 - 46.0 % 38.9 38.5 41.7  Platelets 150 - 400 K/uL 197 192 215   Iron/TIBC/Ferritin/ %Sat    Component Value Date/Time   IRON 55 11/16/2019 1101   TIBC 346 11/16/2019 1101   FERRITIN 27 11/16/2019 1101   IRONPCTSAT 16 (L) 11/16/2019 1101    EGD 11/28/2017:Patchy gastritis  Colonoscopy 02/05/2009 Incomplete   Virtual colonoscopy in 2015 but had inadequate visualization of the descending and sigmoid colon due to the severe diverticulosis and limited distensibility  Virtual colonoscopy January 04 2018: Mild colonic diverticulosis otherwise no fixed defects or mass lesions.  Hemoglobin remained stable on IV iron infusion, has persistent iron deficiency with low iron saturation  Denies any nausea, vomiting, abdominal pain, melena or bright red blood per rectum    Outpatient Encounter Medications as of 12/10/2019  Medication Sig  . b complex vitamins tablet Take 1 tablet by mouth daily.    . calcium carbonate (OS-CAL - DOSED IN MG OF ELEMENTAL CALCIUM) 1250 MG tablet Take 1 tablet by mouth daily.    . Cholecalciferol (VITAMIN D) 2000 UNITS tablet Take 2,000 Units by mouth daily.    Marland Kitchen dicyclomine (BENTYL) 20 MG tablet TAKE 1 TABLET BY MOUTH TWICE A DAY  . ferrous sulfate 325 (65 FE) MG EC tablet Take  325 mg by mouth 3 (three) times daily with meals.  Marland Kitchen glycopyrrolate (ROBINUL) 2 MG tablet Take 1 tablet (2 mg total) by mouth 2 (two) times daily.  Marland Kitchen HYDROcodone-acetaminophen (NORCO/VICODIN) 5-325 MG tablet Take 1 tablet by mouth every 4 (four) hours as needed.  . ibandronate (BONIVA) 150 MG tablet Take 150 mg by mouth every 30 (thirty) days. Take in the morning with a full glass of water, on an empty stomach, and do not take anything else by mouth or lie down for the next 30 min.  Astrid Drafts Omega-3 300 MG CAPS Take 1 capsule by mouth daily.  Marland Kitchen loperamide (IMODIUM) 2 MG capsule Take 2 mg by mouth as needed. Prn diarrhea, otc   . meclizine (ANTIVERT) 25 MG tablet Take 25 mg by mouth as needed for dizziness.  . metoprolol succinate (TOPROL-XL) 25 MG 24 hr tablet Take 25 mg by mouth 2 (two) times daily.    . Multiple Vitamins-Minerals (MULTIVITAMINS THER. W/MINERALS) TABS Take 1 tablet by mouth daily.    . pantoprazole (PROTONIX) 40 MG tablet Take 40 mg by mouth daily.  . polyethylene glycol (MIRALAX / GLYCOLAX) packet Take 17 g by mouth daily as needed.  . rosuvastatin (CRESTOR) 5 MG tablet Take 5 mg by mouth daily.  Marland Kitchen triamterene-hydrochlorothiazide (MAXZIDE-25) 37.5-25 MG tablet Take 1 tablet by mouth daily.  . Zinc 100 MG TABS Take by mouth.   No facility-administered encounter medications on file as  of 12/10/2019.    Allergies as of 12/10/2019 - Review Complete 12/10/2019  Allergen Reaction Noted  . Codeine Other (See Comments) 07/07/2016    Past Medical History:  Diagnosis Date  . Diverticulosis of colon   . Endometrial cancer ([place])    1A grade endometrial ca  . Essential hypertension 01/24/2009   Qualifier: Diagnosis of  By: Nelson-Smith CMA (AAMA), Dottie    . Hx of radiation therapy 04/24/08-05/29/08& 8/12/,8/19,&06/27/08   external beam  through 7/09 ,then intracavity in 06/2008  . Hypertension   . 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    Past Surgical History:  Procedure  Laterality Date  . APPENDECTOMY     1967  . [place]  . LAPAROSCOPIC HYSTERECTOMY     total  . ORTHOPEDIC SURGERY     Orthoscopic Knee Surgery  . TEAR DUCT PROBING    . TOOTH EXTRACTION     bone graft    Family History  Problem Relation Age of Onset  . Diabetes type II Mother   . Breast cancer Mother   . Leukemia Father   . Diabetes type II Sister   . Pancreatic cancer Maternal Aunt        x2  . Diabetes Maternal Grandmother   . Diabetes Maternal Grandfather   . Colon cancer Neg Hx     Social History   Socioeconomic History  . Marital status: Married    Spouse name: Not on file  . Number of children: 1  . Years of education: Not on file  . Highest education level: Not on file  Occupational History  . Occupation: Web designer  Tobacco Use  . Smoking status: Never Smoker  . Smokeless tobacco: Never Used  Substance and Sexual Activity  . Alcohol use: Yes    Alcohol/week: 0.0 standard drinks    Comment: less then 2 a month  . Drug use: No  . Sexual activity: Never  Other Topics Concern  . Not on file  Social History Narrative  . Not on file   Social Determinants of Health   Financial Resource Strain:   . Difficulty of Paying Living Expenses: Not on file  Food Insecurity:   . Worried About Charity fundraiser in the Last Year: Not on file  . Ran Out of Food in the Last Year: Not on file  Transportation Needs:   . Lack of Transportation (Medical): Not on file  . Lack of Transportation (Non-Medical): Not on file  Physical Activity:   . Days of Exercise per Week: Not on file  . Minutes of Exercise per Session: Not on file  Stress:   . Feeling of Stress : Not on file  Social Connections:   . Frequency of Communication with Friends and Family: Not on file  . Frequency of Social Gatherings with Friends and Family: Not on file  . Attends Religious Services: Not on file  . Active Member of Clubs or Organizations: Not on file    . Attends Archivist Meetings: Not on file  . Marital Status: Not on file  Intimate Partner Violence:   . Fear of Current or Ex-Partner: Not on file  . Emotionally Abused: Not on file  . Physically Abused: Not on file  . Sexually Abused: Not on file      Review of systems:  All other review of systems negative except as mentioned in the HPI.   Physical Exam: Vitals:   12/10/19 1407  BP: 112/66  Pulse: 64  Temp: 97.9 F (36.6 C)   Body mass index is 29.7 kg/m. Gen:      No acute distress HEENT:  EOMI, sclera anicteric Neck:     No masses; no thyromegaly Lungs:    Clear to auscultation bilaterally; normal respiratory effort CV:         Regular rate and rhythm; no murmurs Abd:      + bowel sounds; soft, non-tender; no palpable masses, no distension Ext:    No edema; adequate peripheral perfusion Skin:      Warm and dry; no rash Neuro: alert and oriented x 3 Psych: normal mood and affect  Data Reviewed:  Reviewed labs, radiology imaging, old records and pertinent past GI work up   Assessment and Plan/Recommendations:  79 year old female with history of chronic irritable bowel syndrome with complaints of worsening diarrhea.  She also has iron deficiency anemia  No recent colonoscopy but had virtual CT colonoscopy March 2019, was negative for any mass lesions Colonoscopy in 2010 by Dr. Olevia Perches was incomplete due to spasm and severe diverticulosis  Explained to patient that CT virtual colonoscopy is inadequate to evaluate for any possible lesions for chronic occult GI blood loss and it can miss flat lesions.  Over the past few years we have better techniques and also scopes and will likely be successful in completing the colonoscopy.  She is willing to proceed with colonoscopy.  Can also obtain biopsies to exclude microscopic colitis given worsening diarrhea symptoms  Discussed trial of lactose-free diet Start Benefiber 1 tablespoon 3 times daily with meals  after colonoscopy May need to modify diet given severe diverticular disease, provided instructions to patient Avoid frequent use of Imodium Follow-up after colonoscopy  The risks and benefits as well as alternatives of endoscopic procedure(s) have been discussed and reviewed. All questions answered. The patient agrees to proceed.   The patient was provided an opportunity to ask questions and all were answered. The patient agreed with the plan and demonstrated an understanding of the instructions.  Damaris Hippo , MD    CC: Deland Pretty, MD

## 2019-12-13 ENCOUNTER — Encounter: Payer: Self-pay | Admitting: Gastroenterology

## 2019-12-18 ENCOUNTER — Encounter: Payer: Self-pay | Admitting: Gastroenterology

## 2019-12-28 ENCOUNTER — Ambulatory Visit (INDEPENDENT_AMBULATORY_CARE_PROVIDER_SITE_OTHER): Payer: PPO

## 2019-12-28 ENCOUNTER — Other Ambulatory Visit: Payer: Self-pay | Admitting: Gastroenterology

## 2019-12-28 ENCOUNTER — Other Ambulatory Visit: Payer: Self-pay

## 2019-12-28 DIAGNOSIS — Z1159 Encounter for screening for other viral diseases: Secondary | ICD-10-CM | POA: Diagnosis not present

## 2019-12-29 LAB — SARS CORONAVIRUS 2 (TAT 6-24 HRS): SARS Coronavirus 2: NEGATIVE

## 2020-01-01 ENCOUNTER — Other Ambulatory Visit: Payer: Self-pay

## 2020-01-01 ENCOUNTER — Encounter: Payer: Self-pay | Admitting: Gastroenterology

## 2020-01-01 ENCOUNTER — Ambulatory Visit (AMBULATORY_SURGERY_CENTER): Payer: PPO | Admitting: Gastroenterology

## 2020-01-01 VITALS — BP 134/85 | HR 67 | Temp 97.1°F | Resp 17 | Ht 64.0 in | Wt 173.0 lb

## 2020-01-01 DIAGNOSIS — R197 Diarrhea, unspecified: Secondary | ICD-10-CM | POA: Diagnosis not present

## 2020-01-01 DIAGNOSIS — K648 Other hemorrhoids: Secondary | ICD-10-CM | POA: Diagnosis not present

## 2020-01-01 DIAGNOSIS — K6389 Other specified diseases of intestine: Secondary | ICD-10-CM | POA: Diagnosis not present

## 2020-01-01 DIAGNOSIS — D500 Iron deficiency anemia secondary to blood loss (chronic): Secondary | ICD-10-CM

## 2020-01-01 DIAGNOSIS — K5730 Diverticulosis of large intestine without perforation or abscess without bleeding: Secondary | ICD-10-CM | POA: Diagnosis not present

## 2020-01-01 DIAGNOSIS — K56609 Unspecified intestinal obstruction, unspecified as to partial versus complete obstruction: Secondary | ICD-10-CM | POA: Diagnosis not present

## 2020-01-01 MED ORDER — SODIUM CHLORIDE 0.9 % IV SOLN: 500.0000 mL | INTRAVENOUS | Status: DC

## 2020-01-01 NOTE — Op Note (Addendum)
[facility] Patient Name: [name] Procedure Date: 01/01/2020 8:05 AM MRN: [id number] Endoscopist: Mauri Pole , MD Age: 79 Referring MD:  Date of Birth: [birth year] Gender: Female Account #: [id number] Procedure:                Colonoscopy Indications:              Clinically significant diarrhea of unexplained                            origin Medicines:                Monitored Anesthesia Care Procedure:                Pre-Anesthesia Assessment:                           - Prior to the procedure, a History and Physical                            was performed, and patient medications and                            allergies were reviewed. The patient's tolerance of                            previous anesthesia was also reviewed. The risks                            and benefits of the procedure and the sedation                            options and risks were discussed with the patient.                            All questions were answered, and informed consent                            was obtained. Prior Anticoagulants: The patient has                            taken no previous anticoagulant or antiplatelet                            agents. ASA Grade Assessment: II - A patient with                            mild systemic disease. After reviewing the risks                            and benefits, the patient was deemed in                            satisfactory condition to undergo the procedure.  After obtaining informed consent, the colonoscope                            was passed under direct vision. Throughout the                            procedure, the patient's blood pressure, pulse, and                            oxygen saturations were monitored continuously. The                            Colonoscope was introduced through the anus and                            advanced to the the sigmoid colon. The Colonoscope                        was introduced through the anus and advanced to the                            the sigmoid colon. The colonoscopy was technically                            difficult and complex due to bowel stenosis and                            restricted mobility of the colon. Unable to extend                            beyond sigmoid colon even by replacing with the                            adult endoscope. The patient tolerated the                            procedure well. Scope In: 8:09:23 AM Scope Out: 8:27:35 AM Total Procedure Duration: 0 hours 18 minutes 12 seconds  Findings:                 The perianal and digital rectal examinations were                            normal.                           Multiple small-mouthed diverticula were found in                            the sigmoid colon.                           A severe stenosis measuring of unknown length was  found in the sigmoid colon and was non-traversed.                            Unable to extend scope beyong 30cm from anal verge                            despite switching from pediatric colonoscope to                            adult endoscope.                           Biopsies for histology were taken with a cold                            forceps from the sigmoid colon and rectum for                            evaluation of microscopic colitis.                           Non-bleeding internal hemorrhoids were found during                            retroflexion. The hemorrhoids were mild. Complications:            No immediate complications. Estimated Blood Loss:     Estimated blood loss was minimal. Impression:               - Diverticulosis in the sigmoid colon.                           - Stricture in the sigmoid colon. Biopsied.                           - Non-bleeding internal hemorrhoids. Recommendation:           - Patient has a contact number available for                             emergencies. The signs and symptoms of potential                            delayed complications were discussed with the                            patient. Return to normal activities tomorrow.                            Written discharge instructions were provided to the                            patient.                           - Resume previous diet.                           -  Continue present medications.                           - Await pathology results.                           - No repeat colonoscopy due to age.                           - Return to GI clinic at the next available                            appointment.                           - Benefiber 1 tablespoon TID with meals Mauri Pole, MD 01/01/2020 8:37:13 AM This report has been signed electronically.

## 2020-01-01 NOTE — Progress Notes (Signed)
pt tolerated well. VSS. awake and to recovery. Report given to RN.  

## 2020-01-01 NOTE — Patient Instructions (Addendum)
Take Benefiber 1 Tablespoon three times daily with meals  DRINK 8 CUPS OF WATER PER DAY.   YOU HAD AN ENDOSCOPIC PROCEDURE TODAY AT [facility]:   Refer to the procedure report that was given to you for any specific questions about what was found during the examination.  If the procedure report does not answer your questions, please call your gastroenterologist to clarify.  If you requested that your care partner not be given the details of your procedure findings, then the procedure report has been included in a sealed envelope for you to review at your convenience later.  YOU SHOULD EXPECT: Some feelings of bloating in the abdomen. Passage of more gas than usual.  Walking can help get rid of the air that was put into your GI tract during the procedure and reduce the bloating. If you had a lower endoscopy (such as a colonoscopy or flexible sigmoidoscopy) you may notice spotting of blood in your stool or on the toilet paper. If you underwent a bowel prep for your procedure, you may not have a normal bowel movement for a few days.  Please Note:  You might notice some irritation and congestion in your nose or some drainage.  This is from the oxygen used during your procedure.  There is no need for concern and it should clear up in a day or so.  SYMPTOMS TO REPORT IMMEDIATELY:   Following lower endoscopy (colonoscopy or flexible sigmoidoscopy):  Excessive amounts of blood in the stool  Significant tenderness or worsening of abdominal pains  Swelling of the abdomen that is new, acute  Fever of 100F or higher  For urgent or emergent issues, a gastroenterologist can be reached at any hour by calling [phone].   DIET:  We do recommend a small meal at first, but then you may proceed to your regular diet.  Drink plenty of fluids but you should avoid alcoholic beverages for 24 hours.  ACTIVITY:  You should plan to take it easy for the rest of today and you should NOT DRIVE or  use heavy machinery until tomorrow (because of the sedation medicines used during the test).    FOLLOW UP: Our staff will call the number listed on your records 48-72 hours following your procedure to check on you and address any questions or concerns that you may have regarding the information given to you following your procedure. If we do not reach you, we will leave a message.  We will attempt to reach you two times.  During this call, we will ask if you have developed any symptoms of COVID 19. If you develop any symptoms (ie: fever, flu-like symptoms, shortness of breath, cough etc.) before then, please call [phone].  If you test positive for Covid 19 in the 2 weeks post procedure, please call and report this information to [place].    If any biopsies were taken you will be contacted by phone or by letter within the next 1-3 weeks.  Please call us at [phone] if you have not heard about the biopsies in 3 weeks.    SIGNATURES/CONFIDENTIALITY: You and/or your care partner have signed paperwork which will be entered into your electronic medical record.  These signatures attest to the fact that that the information above on your After Visit Summary has been reviewed and is understood.  Full responsibility of the confidentiality of this discharge information lies with you and/or your care-partner.

## 2020-01-01 NOTE — Progress Notes (Signed)
Called to room to assist during endoscopic procedure.  Patient ID and intended procedure confirmed with present staff. Received instructions for my participation in the procedure from the performing physician.  

## 2020-01-03 ENCOUNTER — Encounter: Payer: Self-pay | Admitting: Gastroenterology

## 2020-01-03 ENCOUNTER — Telehealth: Payer: Self-pay

## 2020-01-03 NOTE — Telephone Encounter (Signed)
Left message on follow up call. 

## 2020-01-03 NOTE — Telephone Encounter (Signed)
Left message on 2nd follow up call. 

## 2020-01-04 ENCOUNTER — Other Ambulatory Visit: Payer: Self-pay

## 2020-01-04 ENCOUNTER — Telehealth: Payer: Self-pay | Admitting: Gastroenterology

## 2020-01-04 DIAGNOSIS — R197 Diarrhea, unspecified: Secondary | ICD-10-CM

## 2020-01-04 NOTE — Telephone Encounter (Signed)
She has had 2 doses of Benefiber so far. This morning her 3 stools were soft, not formed. Since then the stools have become more watery. Estimates she has had a total of 6 stools including the soft stools this morning. The skin around her rectum has become raw and some bleeding. She has used Vaseline for this, but it still burns.  She has not taken any Imodium. She has had Bentyl in the past and does not feel it was helpful. Suggestions?

## 2020-01-04 NOTE — Telephone Encounter (Signed)
Discussed with the patient. She agrees to this plan of care. She will come pick up the stool testing vials today. Sample of Recticare at the front desk for her to try also. OV 01/11/20 at 8:30 am with Nicoletta Ba, [name]

## 2020-01-04 NOTE — Telephone Encounter (Signed)
If she is having watery stool, please advise patient to come in to check for C.diff/GI pathogen panel, will need to exclude infectious etiology.   Continue Benefiber, increase water and fluid intake to maintain hydration.  Ok to use Imodium 1-2 tablets daily as needed if stool studies negative for infection Anusol cream small amount per rectum along with lidocaine (Recticare) twice daily as needed Schedule follow up office visit next available with me or APP next week. Thanks

## 2020-01-07 ENCOUNTER — Other Ambulatory Visit: Payer: PPO

## 2020-01-07 DIAGNOSIS — R197 Diarrhea, unspecified: Secondary | ICD-10-CM

## 2020-01-08 LAB — GASTROINTESTINAL PATHOGEN PANEL PCR
C. difficile Tox A/B, PCR: NOT DETECTED
Campylobacter, PCR: NOT DETECTED
Cryptosporidium, PCR: NOT DETECTED
E coli (ETEC) LT/ST PCR: NOT DETECTED
E coli (STEC) stx1/stx2, PCR: NOT DETECTED
E coli 0157, PCR: NOT DETECTED
Giardia lamblia, PCR: NOT DETECTED
Norovirus, PCR: NOT DETECTED
Rotavirus A, PCR: NOT DETECTED
Salmonella, PCR: NOT DETECTED
Shigella, PCR: NOT DETECTED

## 2020-01-08 LAB — CLOSTRIDIUM DIFFICILE BY PCR: Toxigenic C. Difficile by PCR: NEGATIVE

## 2020-01-11 ENCOUNTER — Ambulatory Visit: Payer: PPO | Admitting: Physician Assistant

## 2020-01-15 ENCOUNTER — Other Ambulatory Visit (INDEPENDENT_AMBULATORY_CARE_PROVIDER_SITE_OTHER): Payer: PPO

## 2020-01-15 ENCOUNTER — Encounter: Payer: Self-pay | Admitting: Nurse Practitioner

## 2020-01-15 ENCOUNTER — Ambulatory Visit (INDEPENDENT_AMBULATORY_CARE_PROVIDER_SITE_OTHER): Payer: PPO | Admitting: Nurse Practitioner

## 2020-01-15 VITALS — BP 110/60 | HR 68 | Temp 98.6°F | Ht 63.0 in | Wt 171.5 lb

## 2020-01-15 DIAGNOSIS — R10822 Left upper quadrant rebound abdominal tenderness: Secondary | ICD-10-CM

## 2020-01-15 DIAGNOSIS — K529 Noninfective gastroenteritis and colitis, unspecified: Secondary | ICD-10-CM

## 2020-01-15 DIAGNOSIS — Z800 Family history of malignant neoplasm of digestive organs: Secondary | ICD-10-CM

## 2020-01-15 LAB — CBC WITH DIFFERENTIAL/PLATELET
Basophils Absolute: 0.1 10*3/uL (ref 0.0–0.1)
Basophils Relative: 1 % (ref 0.0–3.0)
Eosinophils Absolute: 0.2 10*3/uL (ref 0.0–0.7)
Eosinophils Relative: 2.2 % (ref 0.0–5.0)
HCT: 39.3 % (ref 36.0–46.0)
Hemoglobin: 13.3 g/dL (ref 12.0–15.0)
Lymphocytes Relative: 20.8 % (ref 12.0–46.0)
Lymphs Abs: 1.7 10*3/uL (ref 0.7–4.0)
MCHC: 33.9 g/dL (ref 30.0–36.0)
MCV: 88.2 fl (ref 78.0–100.0)
Monocytes Absolute: 1.2 10*3/uL — ABNORMAL HIGH (ref 0.1–1.0)
Monocytes Relative: 14.2 % — ABNORMAL HIGH (ref 3.0–12.0)
Neutro Abs: 5.2 10*3/uL (ref 1.4–7.7)
Neutrophils Relative %: 61.8 % (ref 43.0–77.0)
Platelets: 222 10*3/uL (ref 150.0–400.0)
RBC: 4.45 Mil/uL (ref 3.87–5.11)
RDW: 15.4 % (ref 11.5–15.5)
WBC: 8.3 10*3/uL (ref 4.0–10.5)

## 2020-01-15 LAB — BASIC METABOLIC PANEL
BUN: 17 mg/dL (ref 6–23)
CO2: 32 mEq/L (ref 19–32)
Calcium: 9.2 mg/dL (ref 8.4–10.5)
Chloride: 96 mEq/L (ref 96–112)
Creatinine, Ser: 1.12 mg/dL (ref 0.40–1.20)
GFR: 47.21 mL/min — ABNORMAL LOW (ref >60.00)
Glucose, Bld: 105 mg/dL — ABNORMAL HIGH (ref 70–99)
Potassium: 3.8 mEq/L (ref 3.5–5.1)
Sodium: 134 mEq/L — ABNORMAL LOW (ref 135–145)

## 2020-01-15 LAB — C-REACTIVE PROTEIN: CRP: <1 mg/dL (ref 0.5–20.0)

## 2020-01-15 NOTE — Progress Notes (Signed)
01/15/2020 SKYLENE OTTMAR [id number] 1941/04/19   Chief Complaint:  Chronic diarrhea   History of Present Illness: Dawn [name] is a 79 year old [name] with a past medical history of hypertension, IDA, endometrial cancer s/p radiation in 2009. She presents today for further follow up regarding chronic diarrhea. She underwent a colonoscopy by Dr. Silverio Decamp 01/01/2020 which identified a severe stricture and diverticulosis to Dawn sigmoid colon. Biopsies of Dawn sigmoid colon showed hyperplastic changes without evidence of IBD or microscopic colitis. She was advised to take Benefiber three times daily which resulted in worsening diarrhea. She is now taking Benefiber 1 TBSP once daily. She continued to have frequent diarrhea, sometimes urgent when she did not get to Dawn bathroom fast enough and she soiled herself. GI pathogen panel was ordered and she was instructed to take Imodium 1 to 2 tabs daily as needed and to follow up in our office this week. She continues to have watery to mud like stool several times daily. She is eating yogurt daily, no other dairy products. She is drinking 2 to 3 cups of caffeinated coffee daily. She is using a natural sweetener a few times weekly. No recent antibiotics. No weight loss. No floating stools or oily discharge in Dawn toilet water. Two maternal aunts died from pancreatic cancer, one was in her 51's and Dawn other in her 68's.    CBC Latest Ref Rng & Units 11/16/2019 05/14/2019 02/12/2019  WBC 4.0 - 10.5 K/uL 6.8 7.1 9.7  Hemoglobin 12.0 - 15.0 g/dL 12.6 12.3 13.5  Hematocrit 36.0 - 46.0 % 38.9 38.5 41.7  Platelets 150 - 400 K/uL 197 192 215   CMP Latest Ref Rng & Units 09/19/2011 09/18/2011 09/17/2011  Glucose 70 - 99 mg/dL 100(H) 101(H) 125(H)  BUN 6 - 23 mg/dL 8 11 13   Creatinine 0.50 - 1.10 mg/dL 0.73 0.78 0.83  Sodium 135 - 145 mEq/L 138 139 138  Potassium 3.5 - 5.1 mEq/L 3.3(L) 3.5 3.9  Chloride 96 - 112 mEq/L 106 106 103  CO2 19 - 32 mEq/L 25 26 27     Calcium 8.4 - 10.5 mg/dL 8.5 8.4 9.3  Total Protein 6.0 - 8.3 g/dL - 5.3(L) 6.1  Total Bilirubin 0.3 - 1.2 mg/dL - 0.8 0.9  Alkaline Phos 39 - 117 U/L - 47 54  AST 0 - 37 U/L - 12 15  ALT 0 - 35 U/L - 14 17    Colonoscopy 01/01/2020:  - Diverticulosis in Dawn sigmoid colon. - Stricture in Dawn sigmoid colon. Biopsied. - Non-bleeding internal hemorrhoids.  Current Medications, Allergies, Past Medical History, Past Surgical History, Family History and Social History were reviewed in [employer] record.  Current Outpatient Medications on File Prior to Visit  Medication Sig Dispense Refill  . b complex vitamins tablet Take 1 tablet by mouth daily.      . calcium carbonate (OS-CAL - DOSED IN MG OF ELEMENTAL CALCIUM) 1250 MG tablet Take 1 tablet by mouth daily.      . Cholecalciferol (VITAMIN D) 2000 UNITS tablet Take 2,000 Units by mouth daily.      Marland Kitchen ibandronate (BONIVA) 150 MG tablet Take 150 mg by mouth every 30 (thirty) days. Take in Dawn morning with a full glass of water, on an empty stomach, and do not take anything else by mouth or lie down for Dawn next 30 min.    Astrid Drafts Omega-3 300 MG CAPS Take 1 capsule by mouth daily.    Marland Kitchen  loperamide (IMODIUM) 2 MG capsule Take 2 mg by mouth as needed. Prn diarrhea, otc     . meclizine (ANTIVERT) 25 MG tablet Take 25 mg by mouth as needed for dizziness.    . metoprolol succinate (TOPROL-XL) 25 MG 24 hr tablet Take 25 mg by mouth 2 (two) times daily.      . Multiple Vitamins-Minerals (MULTIVITAMINS THER. W/MINERALS) TABS Take 1 tablet by mouth daily.      . pantoprazole (PROTONIX) 40 MG tablet Take 40 mg by mouth daily.    . rosuvastatin (CRESTOR) 5 MG tablet Take 5 mg by mouth daily.    Marland Kitchen triamterene-hydrochlorothiazide (MAXZIDE-25) 37.5-25 MG tablet Take 1 tablet by mouth daily.    . Zinc 100 MG TABS Take by mouth.     No current facility-administered medications on file prior to visit.   Allergies  Allergen  Reactions  . Codeine Other (See Comments)    Hyper     Physical Exam: BP 110/60 (BP Location: Left Arm, Patient Position: Sitting, Cuff Size: Normal)   Pulse 68   Temp 98.6 F (37 C)   Ht 5\' 3"  (1.6 m) Comment: height measured without shoes  Wt 171 lb 8 oz (77.8 kg)   BMI 30.38 kg/m  General: Well developed  79 year old [name] in no acute distress. Head: Normocephalic and atraumatic. Eyes:  No scleral icterus. Conjunctiva pink . Ears: Normal auditory acuity. Lungs: Clear throughout to auscultation. Heart: Regular rate and rhythm, no murmur. Abdomen: Soft, nondistended. Firm area palpated left of Dawn epigastrium to Dawn medial LUQ, area was mildly tender. No hepatomegaly. Normal bowel sounds x 4 quadrants.  Rectal: Patient declined exam and rectal irritation resolved.  Musculoskeletal: Symmetrical with no gross deformities. Extremities: No edema. Neurological: Alert oriented x 4. No focal deficits.  Psychological:  Alert and cooperative. Normal mood and affect  Assessment and Recommendations:  73. 79 year old [name] with diarrhea with increased urgency and fecal incontinence. S/P colonoscopy 01/01/2020 was incomplete due to a severe sigmoid stricture and diverticular disease. Biopsies did not show any evidence of a malignant process.  -Florastor probiotic once daily. Phillip's bacteria probiotic once daily for 2 -Imodium 1 to 2 tabs daily as needed, reduce dose if stools become hard or strain, stop if no BM in 24 hours. Avoid overuse in setting of diverticular stricture.  -CBC, CMP and CRP  2. IDA, stable.   3. LUQ tenderness on exam, LUQ area slightly thickened without discrete mass. EGD 11/28/2017  Showed chronic inactive gastritis. Maternal aunt x 2 with history of pancreatic cancer.  -CTAP with oral and IV contrast.  4. History of endometrial cancer s/p radiation 2009

## 2020-01-15 NOTE — Patient Instructions (Signed)
If you are age 77 or older, your body mass index should be between 23-30. Your Body mass index is 30.38 kg/m. If this is out of the aforementioned range listed, please consider follow up with your Primary Care Provider.  If you are age 87 or younger, your body mass index should be between 19-25. Your Body mass index is 30.38 kg/m. If this is out of the aformentioned range listed, please consider follow up with your Primary Care Provider.   Your provider has requested that you go to the basement level for lab work before leaving today. Press "B" on the elevator. The lab is located at the first door on the left as you exit the elevator.  You have been scheduled for a CT scan of the abdomen and pelvis at [facility] are scheduled on 01/24/2020 at 3:00 pm. You should arrive 15 minutes prior to your appointment time for registration. Please follow the written instructions below on the day of your exam:  WARNING: IF YOU ARE ALLERGIC TO IODINE/X-RAY DYE, PLEASE NOTIFY RADIOLOGY IMMEDIATELY AT [phone]! YOU WILL BE GIVEN A 13 HOUR PREMEDICATION PREP.  1) Do not eat or drink anything after 11:00am (4 hours prior to your test) 2) You have been given 2 bottles of oral contrast to drink. The solution may taste better if refrigerated, but do NOT add ice or any other liquid to this solution. Shake well before drinking.    Drink 1 bottle of contrast @ 1:00pm (2 hours prior to your exam)  Drink 1 bottle of contrast @ 2:00pm (1 hour prior to your exam)  You may take any medications as prescribed with a small amount of water, if necessary. If you take any of the following medications: METFORMIN, GLUCOPHAGE, GLUCOVANCE, AVANDAMET, RIOMET, FORTAMET, [place] MET, JANUMET, GLUMETZA or METAGLIP, you MAY be asked to HOLD this medication 48 hours AFTER the exam.  The purpose of you drinking the oral contrast is to aid in the visualization of your intestinal tract. The contrast solution may cause some  diarrhea. Depending on your individual set of symptoms, you may also receive an intravenous injection of x-ray contrast/dye.  This test typically takes 30-45 minutes to complete.  If you have any questions regarding your exam or if you need to reschedule, you may call the CT department at between the hours of 8:00 am and 5:00 pm, Monday-Friday. [phone]  ________________________________________________________________________ Please purchase the following medications over the counter and take as directed:  Imodium 1-2 tablets in the morning, reduce by 1 tablet if stools become hard or you have to strain.  Florastor probiotic 1 tablet by mouth twice a day for 4 weeks.  It is okay to also take a bacteria probiotic 1 tablet by mouth daily for 4 weeks  Due to recent changes in healthcare laws, you may see the results of your imaging and laboratory studies on MyChart before your provider has had a chance to review them.  We understand that in some cases there may be results that are confusing or concerning to you. Not all laboratory results come back in the same time frame and the provider may be waiting for multiple results in order to interpret others.  Please give [place] 48 hours in order for your provider to thoroughly review all the results before contacting the office for clarification of your results.   Thank you for choosing [facility] Noralyn Pick, CRNP

## 2020-01-24 ENCOUNTER — Ambulatory Visit (HOSPITAL_COMMUNITY): Payer: PPO

## 2020-01-25 NOTE — Progress Notes (Signed)
Reviewed and agree with documentation and assessment and plan. K. Veena Chief Walkup , MD   

## 2020-02-01 ENCOUNTER — Other Ambulatory Visit: Payer: Self-pay

## 2020-02-01 ENCOUNTER — Ambulatory Visit (HOSPITAL_COMMUNITY)
Admission: RE | Admit: 2020-02-01 | Discharge: 2020-02-01 | Disposition: A | Discharge status: Home / Self Care | Payer: PPO | Source: Ambulatory Visit | Attending: Nurse Practitioner | Admitting: Nurse Practitioner

## 2020-02-01 DIAGNOSIS — Z800 Family history of malignant neoplasm of digestive organs: Secondary | ICD-10-CM | POA: Insufficient documentation

## 2020-02-01 DIAGNOSIS — R109 Unspecified abdominal pain: Secondary | ICD-10-CM | POA: Diagnosis not present

## 2020-02-01 DIAGNOSIS — R10822 Left upper quadrant rebound abdominal tenderness: Secondary | ICD-10-CM | POA: Insufficient documentation

## 2020-02-01 DIAGNOSIS — K529 Noninfective gastroenteritis and colitis, unspecified: Secondary | ICD-10-CM | POA: Insufficient documentation

## 2020-02-01 DIAGNOSIS — R197 Diarrhea, unspecified: Secondary | ICD-10-CM | POA: Diagnosis not present

## 2020-02-01 MED ORDER — IOHEXOL 300 MG/ML  SOLN
100.0000 mL | Freq: Once | INTRAMUSCULAR | Status: AC | PRN
  Administered 2020-02-01: 80 mL via INTRAVENOUS

## 2020-02-01 MED ORDER — SODIUM CHLORIDE (PF) 0.9 % IJ SOLN
INTRAMUSCULAR | Status: AC
  Filled 2020-02-01: qty 50

## 2020-02-04 ENCOUNTER — Ambulatory Visit: Payer: PPO | Admitting: Cardiology

## 2020-02-26 DIAGNOSIS — B078 Other viral warts: Secondary | ICD-10-CM | POA: Diagnosis not present

## 2020-04-22 DIAGNOSIS — D509 Iron deficiency anemia, unspecified: Secondary | ICD-10-CM | POA: Diagnosis not present

## 2020-04-22 DIAGNOSIS — I700 Atherosclerosis of aorta: Secondary | ICD-10-CM | POA: Diagnosis not present

## 2020-04-22 DIAGNOSIS — Z789 Other specified health status: Secondary | ICD-10-CM | POA: Diagnosis not present

## 2020-04-28 ENCOUNTER — Ambulatory Visit: Payer: PPO | Admitting: Cardiology

## 2020-04-28 ENCOUNTER — Encounter: Payer: Self-pay | Admitting: Cardiology

## 2020-04-28 ENCOUNTER — Other Ambulatory Visit: Payer: Self-pay

## 2020-04-28 VITALS — BP 118/59 | HR 64 | Resp 16 | Ht 63.0 in | Wt 168.2 lb

## 2020-04-28 DIAGNOSIS — I10 Essential (primary) hypertension: Secondary | ICD-10-CM

## 2020-04-28 DIAGNOSIS — I340 Nonrheumatic mitral (valve) insufficiency: Secondary | ICD-10-CM

## 2020-04-28 DIAGNOSIS — R0609 Other forms of dyspnea: Secondary | ICD-10-CM

## 2020-04-28 DIAGNOSIS — I071 Rheumatic tricuspid insufficiency: Secondary | ICD-10-CM | POA: Diagnosis not present

## 2020-04-28 DIAGNOSIS — R0600 Dyspnea, unspecified: Secondary | ICD-10-CM

## 2020-04-28 NOTE — Progress Notes (Signed)
Primary Physician/Referring:  Deland Pretty, MD  Patient ID: [name], female    DOB: July 19, 1941, 79 y.o.   MRN: [id number]  Chief Complaint  Patient presents with  . Mitral Regurgitation  . Shortness of Breath  . Follow-up    6 month   HPI:    [name]  is a 79 y.o. Caucasian female patient with Essential hypertension, history of diverticulosis, endometrial cancer in 2009 treated with surgery and radiation therapy, previously followed Dr. 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   Except for chronic dyspnea on exertion which she states is getting worse, no new complaints. She has never smoked. 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 She used to receive iron infusions and now being worked up by Dr. Sonny Dandy again for the same.  No PND or orthopnea.   Past Medical History:  Diagnosis Date  . Anemia   . Arthritis   . Cataract   . Diverticulosis of colon   . Endometrial cancer ([place])    1A grade endometrial ca  . Essential hypertension 01/24/2009   Qualifier: Diagnosis of  By: Nelson-Smith CMA (AAMA), Dottie    . Hx of radiation therapy 04/24/08-05/29/08& 8/12/,8/19,&06/27/08   external beam  through 7/09 ,then intracavity in 06/2008  . Hypertension   . 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   Past Surgical History:  Procedure Laterality Date  . APPENDECTOMY     1967  . arthroscopic knee right surgery    . [place]  . LAPAROSCOPIC HYSTERECTOMY     total  . ORTHOPEDIC SURGERY     Orthoscopic Knee Surgery  . TEAR DUCT PROBING    . TOOTH EXTRACTION     bone graft   Social History   Tobacco Use  . Smoking status: Never Smoker  . Smokeless tobacco: Never Used  Substance Use Topics  . Alcohol use: Yes    Alcohol/week: 0.0 standard drinks    Comment: less then 2 a month   ROS  Review of Systems   Constitutional: Positive for malaise/fatigue. Negative for weight gain.  Cardiovascular: Positive for dyspnea on exertion. Negative for chest pain and leg swelling.  Gastrointestinal: Positive for diarrhea (chronic from radiation proctitis) and hematochezia. Negative for melena.  Neurological: Positive for dizziness and headaches.   Objective  Blood pressure (!) 118/59, pulse 64, resp. rate 16, height 5\' 3"  (1.6 m), weight 168 lb 3.2 oz (76.3 kg), SpO2 98 %.  Vitals with BMI 04/28/2020 01/15/2020 01/01/2020  Height 5\' 3"  5\' 3"  -  Weight 168 lbs 3 oz 171 lbs 8 oz -  BMI 34.9 17.91 -  Systolic 505 697 948  Diastolic 59 60 85  Pulse 64 68 67     Physical Exam Constitutional:      Comments: She is moderately built and well nurished  Cardiovascular:     Rate and Rhythm: Normal rate and regular rhythm.     Pulses: Intact distal pulses.     Heart sounds: S1 normal and S2 normal. Murmur heard.  Crescendo-decrescendo mid to late systolic murmur is present with a grade of 3/6 at the apex.  No gallop.      Comments: Trace leg edema. Adipose tissue noted on legs. No JVD.  Pulmonary:     Effort: Pulmonary effort is normal.     Breath sounds: Normal breath sounds.  Abdominal:     General: Bowel sounds are normal.     Palpations: Abdomen is soft.    Laboratory examination:   Recent Labs    01/15/20 1641  NA 134*  K 3.8  CL 96  CO2 32  GLUCOSE 105*  BUN 17  CREATININE 1.12  CALCIUM 9.2   CrCl cannot be calculated (Patient's most recent lab result is older than the maximum 21 days allowed.).  CMP Latest Ref Rng & Units 01/15/2020 09/19/2011 09/18/2011  Glucose 70 - 99 mg/dL 105(H) 100(H) 101(H)  BUN 6 - 23 mg/dL 17 8 11   Creatinine 0.40 - 1.20 mg/dL 1.12 0.73 0.78  Sodium 135 - 145 mEq/L 134(L) 138 139  Potassium 3.5 - 5.1 mEq/L 3.8 3.3(L) 3.5  Chloride 96 - 112 mEq/L 96 106 106  CO2 19 - 32 mEq/L 32 25 26  Calcium 8.4 - 10.5 mg/dL 9.2 8.5 8.4  Total Protein 6.0 - 8.3 g/dL - -  5.3(L)  Total Bilirubin 0.3 - 1.2 mg/dL - - 0.8  Alkaline Phos 39 - 117 U/L - - 47  AST 0 - 37 U/L - - 12  ALT 0 - 35 U/L - - 14   CBC Latest Ref Rng & Units 01/15/2020 11/16/2019 05/14/2019  WBC 4.0 - 10.5 K/uL 8.3 6.8 7.1  Hemoglobin 12.0 - 15.0 g/dL 13.3 12.6 12.3  Hematocrit 36 - 46 % 39.3 38.9 38.5  Platelets 150 - 400 K/uL 222.0 197 192   Lipid Panel  No results found for: CHOL, TRIG, HDL, CHOLHDL, VLDL, LDLCALC, LDLDIRECT HEMOGLOBIN A1C No results found for: HGBA1C, MPG TSH No results for input(s): TSH in the last 8760 hours.   External labs:  Cholesterol, total 171.000 M 10/23/2019 HDL 56.000 M 10/23/2019 LDL-C 83.000 M 10/23/2018 Triglycerides 96.000 M 10/23/2019  Medications and allergies   Allergies  Allergen Reactions  . Crestor [Rosuvastatin] Other (See Comments)    Leg cramps  . Codeine Other (See Comments)    Hyper    Current Outpatient Medications  Medication Instructions  . b complex vitamins tablet 1 tablet, Daily  . Bempedoic Acid-Ezetimibe (NEXLIZET) 180-10 MG TABS Oral  . calcium carbonate (OS-CAL - DOSED IN MG OF ELEMENTAL CALCIUM) 1250 MG tablet 1 tablet, Daily  . Krill Oil Omega-3 300 MG CAPS 1 capsule, Daily  . loperamide (IMODIUM) 2 mg, As needed  . meclizine (ANTIVERT) 25 mg, As needed  . metoprolol succinate (TOPROL-XL) 25 mg, 2 times daily  . Multiple Vitamins-Minerals (MULTIVITAMINS THER. W/MINERALS) TABS 1 tablet, Daily  . pantoprazole (PROTONIX) 40 mg, Oral, Daily  . triamterene-hydrochlorothiazide (MAXZIDE-25) 37.5-25 MG tablet 1 tablet, Oral, Daily  . Vitamin D 2,000 Units, Daily  . Zinc 100 MG TABS Oral   Radiology:  No results found.  Cardiac Studies:   Chest x-ray 12/30/11/2016: Negative two view chest x-ray  CT colonoscopy 01/04/2018: Mild colonic diverticulosis. No fixed polypoid filling defects or annular constricting lesions. No acute extra colonic abnormality in the abdomen or pelvis. Aortic  atherosclerosis.  Lexiscan Tetrofosmin Stress Test  11/12/2019: Nondiagnostic ECG stress. Mild soft tissue attenuation noted in the inferior wall. No ischemia or scar. Stress LV EF: 70%.  No previous exam available for comparison. Low risk study.  Echocardiogram 11/23/2019:  Left ventricle cavity is normal in size and wall thickness. Normal global wall motion. Normal LV systolic function with EF 56%. Normal diastolic  filling pattern. Calculated EF 56%.  Left atrial cavity is moderately dilated. LA vol index 48 cc/m2.  Trileaflet aortic valve. Mild (Grade I) aortic regurgitation.  Moderate (Grade III) mitral regurgitation.  Moderate tricuspid regurgitation. Mild pulmonary hypertension. Estimated pulmonary artery systolic pressure is 38 mmHg.  No significant change compared to previous study on 11/11/2017.  Assessment     ICD-10-CM   1. Moderate mitral regurgitation  I34.0   2. Moderate tricuspid regurgitation  I07.1   3. Dyspnea on exertion  R06.00   4. Essential hypertension  I10     EKG 11/09/2019: Normal sinus rhythm at rate of 60 bpm, normal axis.  No evidence of ischemia.  Normal EKG.  Single PAC.   Recommendations:  No orders of the defined types were placed in this encounter.   [name]  is a 79 y.o. 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  No change in physical exam, no clinical evidence of heart failure, and patient states that her dyspnea has remained stable.  We will continue clinical follow-up of MR and TR.  Blood pressures well controlled.  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 Lipids being managed by Dr. Shelia Media. I will see her back in 1 year.   Adrian Prows, MD, [facility] 04/28/2020, 1:43 PM [facility]. [place] Office: [phone]

## 2020-05-12 DIAGNOSIS — M810 Age-related osteoporosis without current pathological fracture: Secondary | ICD-10-CM | POA: Diagnosis not present

## 2020-05-20 DIAGNOSIS — D509 Iron deficiency anemia, unspecified: Secondary | ICD-10-CM | POA: Diagnosis not present

## 2020-05-20 DIAGNOSIS — I700 Atherosclerosis of aorta: Secondary | ICD-10-CM | POA: Diagnosis not present

## 2020-05-22 DIAGNOSIS — I10 Essential (primary) hypertension: Secondary | ICD-10-CM | POA: Diagnosis not present

## 2020-05-22 DIAGNOSIS — I700 Atherosclerosis of aorta: Secondary | ICD-10-CM | POA: Diagnosis not present

## 2020-05-22 DIAGNOSIS — E7800 Pure hypercholesterolemia, unspecified: Secondary | ICD-10-CM | POA: Diagnosis not present

## 2020-06-19 DIAGNOSIS — I700 Atherosclerosis of aorta: Secondary | ICD-10-CM | POA: Diagnosis not present

## 2020-08-07 DIAGNOSIS — Z124 Encounter for screening for malignant neoplasm of cervix: Secondary | ICD-10-CM | POA: Diagnosis not present

## 2020-08-07 DIAGNOSIS — Z6830 Body mass index (BMI) 30.0-30.9, adult: Secondary | ICD-10-CM | POA: Diagnosis not present

## 2020-08-07 DIAGNOSIS — Z1272 Encounter for screening for malignant neoplasm of vagina: Secondary | ICD-10-CM | POA: Diagnosis not present

## 2020-08-07 DIAGNOSIS — Z1231 Encounter for screening mammogram for malignant neoplasm of breast: Secondary | ICD-10-CM | POA: Diagnosis not present

## 2020-10-28 DIAGNOSIS — I10 Essential (primary) hypertension: Secondary | ICD-10-CM | POA: Diagnosis not present

## 2020-10-28 DIAGNOSIS — Z7982 Long term (current) use of aspirin: Secondary | ICD-10-CM | POA: Diagnosis not present

## 2020-11-04 DIAGNOSIS — Z0000 Encounter for general adult medical examination without abnormal findings: Secondary | ICD-10-CM | POA: Diagnosis not present

## 2020-11-04 DIAGNOSIS — I340 Nonrheumatic mitral (valve) insufficiency: Secondary | ICD-10-CM | POA: Diagnosis not present

## 2020-11-04 DIAGNOSIS — Z8542 Personal history of malignant neoplasm of other parts of uterus: Secondary | ICD-10-CM | POA: Diagnosis not present

## 2020-11-04 DIAGNOSIS — Z03818 Encounter for observation for suspected exposure to other biological agents ruled out: Secondary | ICD-10-CM | POA: Diagnosis not present

## 2020-11-04 DIAGNOSIS — Z0001 Encounter for general adult medical examination with abnormal findings: Secondary | ICD-10-CM | POA: Diagnosis not present

## 2020-11-04 DIAGNOSIS — K529 Noninfective gastroenteritis and colitis, unspecified: Secondary | ICD-10-CM | POA: Diagnosis not present

## 2020-11-04 DIAGNOSIS — I10 Essential (primary) hypertension: Secondary | ICD-10-CM | POA: Diagnosis not present

## 2020-11-04 DIAGNOSIS — D509 Iron deficiency anemia, unspecified: Secondary | ICD-10-CM | POA: Diagnosis not present

## 2020-11-04 DIAGNOSIS — M8580 Other specified disorders of bone density and structure, unspecified site: Secondary | ICD-10-CM | POA: Diagnosis not present

## 2020-11-04 DIAGNOSIS — I700 Atherosclerosis of aorta: Secondary | ICD-10-CM | POA: Diagnosis not present

## 2020-11-04 DIAGNOSIS — Z923 Personal history of irradiation: Secondary | ICD-10-CM | POA: Diagnosis not present

## 2020-11-04 DIAGNOSIS — Z6830 Body mass index (BMI) 30.0-30.9, adult: Secondary | ICD-10-CM | POA: Diagnosis not present

## 2020-11-18 DIAGNOSIS — M810 Age-related osteoporosis without current pathological fracture: Secondary | ICD-10-CM | POA: Diagnosis not present

## 2020-12-03 DIAGNOSIS — H25041 Posterior subcapsular polar age-related cataract, right eye: Secondary | ICD-10-CM | POA: Diagnosis not present

## 2020-12-03 DIAGNOSIS — H25011 Cortical age-related cataract, right eye: Secondary | ICD-10-CM | POA: Diagnosis not present

## 2020-12-03 DIAGNOSIS — H2513 Age-related nuclear cataract, bilateral: Secondary | ICD-10-CM | POA: Diagnosis not present

## 2020-12-03 DIAGNOSIS — H52203 Unspecified astigmatism, bilateral: Secondary | ICD-10-CM | POA: Diagnosis not present

## 2020-12-11 DIAGNOSIS — H35371 Puckering of macula, right eye: Secondary | ICD-10-CM | POA: Diagnosis not present

## 2020-12-18 DIAGNOSIS — B078 Other viral warts: Secondary | ICD-10-CM | POA: Diagnosis not present

## 2020-12-18 DIAGNOSIS — L300 Nummular dermatitis: Secondary | ICD-10-CM | POA: Diagnosis not present

## 2021-01-10 ENCOUNTER — Encounter (HOSPITAL_COMMUNITY): Payer: Self-pay | Admitting: Emergency Medicine

## 2021-01-10 ENCOUNTER — Emergency Department (HOSPITAL_COMMUNITY)
Admission: EM | Admit: 2021-01-10 | Discharge: 2021-01-10 | Disposition: A | Discharge status: Home / Self Care | Payer: PPO | Attending: Emergency Medicine | Admitting: Emergency Medicine

## 2021-01-10 ENCOUNTER — Other Ambulatory Visit: Payer: Self-pay

## 2021-01-10 DIAGNOSIS — K529 Noninfective gastroenteritis and colitis, unspecified: Secondary | ICD-10-CM | POA: Diagnosis not present

## 2021-01-10 DIAGNOSIS — R000 Tachycardia, unspecified: Secondary | ICD-10-CM | POA: Insufficient documentation

## 2021-01-10 DIAGNOSIS — I10 Essential (primary) hypertension: Secondary | ICD-10-CM | POA: Diagnosis not present

## 2021-01-10 DIAGNOSIS — Z8542 Personal history of malignant neoplasm of other parts of uterus: Secondary | ICD-10-CM | POA: Diagnosis not present

## 2021-01-10 DIAGNOSIS — R112 Nausea with vomiting, unspecified: Secondary | ICD-10-CM | POA: Diagnosis present

## 2021-01-10 DIAGNOSIS — Z79899 Other long term (current) drug therapy: Secondary | ICD-10-CM | POA: Insufficient documentation

## 2021-01-10 LAB — COMPREHENSIVE METABOLIC PANEL
ALT: 23 U/L (ref 0–44)
AST: 30 U/L (ref 15–41)
Albumin: 4 g/dL (ref 3.5–5.0)
Alkaline Phosphatase: 36 U/L — ABNORMAL LOW (ref 38–126)
Anion gap: 11 (ref 5–15)
BUN: 14 mg/dL (ref 8–23)
CO2: 24 mmol/L (ref 22–32)
Calcium: 8.7 mg/dL — ABNORMAL LOW (ref 8.9–10.3)
Chloride: 104 mmol/L (ref 98–111)
Creatinine, Ser: 0.88 mg/dL (ref 0.44–1.00)
GFR, Estimated: >60 mL/min (ref >60)
Glucose, Bld: 141 mg/dL — ABNORMAL HIGH (ref 70–99)
Potassium: 3.4 mmol/L — ABNORMAL LOW (ref 3.5–5.1)
Sodium: 139 mmol/L (ref 135–145)
Total Bilirubin: 1 mg/dL (ref 0.3–1.2)
Total Protein: 7 g/dL (ref 6.5–8.1)

## 2021-01-10 LAB — CBC WITH DIFFERENTIAL/PLATELET
Abs Immature Granulocytes: 0.04 10*3/uL (ref 0.00–0.07)
Basophils Absolute: 0 10*3/uL (ref 0.0–0.1)
Basophils Relative: 0 %
Eosinophils Absolute: 0 10*3/uL (ref 0.0–0.5)
Eosinophils Relative: 0 %
HCT: 37.3 % (ref 36.0–46.0)
Hemoglobin: 12 g/dL (ref 12.0–15.0)
Immature Granulocytes: 0 %
Lymphocytes Relative: 5 %
Lymphs Abs: 0.5 10*3/uL — ABNORMAL LOW (ref 0.7–4.0)
MCH: 25.5 pg — ABNORMAL LOW (ref 26.0–34.0)
MCHC: 32.2 g/dL (ref 30.0–36.0)
MCV: 79.4 fL — ABNORMAL LOW (ref 80.0–100.0)
Monocytes Absolute: 1 10*3/uL (ref 0.1–1.0)
Monocytes Relative: 10 %
Neutro Abs: 8.6 10*3/uL — ABNORMAL HIGH (ref 1.7–7.7)
Neutrophils Relative %: 85 %
Platelets: 253 10*3/uL (ref 150–400)
RBC: 4.7 MIL/uL (ref 3.87–5.11)
RDW: 16.4 % — ABNORMAL HIGH (ref 11.5–15.5)
WBC: 10.1 10*3/uL (ref 4.0–10.5)
nRBC: 0 % (ref 0.0–0.2)

## 2021-01-10 LAB — LIPASE, BLOOD: Lipase: 37 U/L (ref 11–51)

## 2021-01-10 MED ORDER — ONDANSETRON 8 MG PO TBDP: 8.0000 mg | ORAL_TABLET | Freq: Three times a day (TID) | ORAL | 0 refills | Status: DC | PRN

## 2021-01-10 MED ORDER — SODIUM CHLORIDE 0.9 % IV BOLUS
1000.0000 mL | Freq: Once | INTRAVENOUS | Status: AC
  Administered 2021-01-10: 1000 mL via INTRAVENOUS

## 2021-01-10 MED ORDER — SODIUM CHLORIDE 0.9 % IV SOLN: INTRAVENOUS | Status: DC

## 2021-01-10 MED ORDER — LOPERAMIDE HCL 2 MG PO CAPS
4.0000 mg | ORAL_CAPSULE | Freq: Once | ORAL | Status: AC
  Administered 2021-01-10: 4 mg via ORAL
  Filled 2021-01-10: qty 2

## 2021-01-10 MED ORDER — ONDANSETRON HCL 4 MG/2ML IJ SOLN
4.0000 mg | Freq: Once | INTRAMUSCULAR | Status: AC
  Administered 2021-01-10: 4 mg via INTRAVENOUS
  Filled 2021-01-10: qty 2

## 2021-01-10 MED ORDER — LOPERAMIDE HCL 2 MG PO CAPS: 2.0000 mg | ORAL_CAPSULE | Freq: Four times a day (QID) | ORAL | 0 refills | Status: DC | PRN

## 2021-01-10 NOTE — Discharge Instructions (Addendum)
Take the medications as needed for vomiting and diarrhea.  Make sure to drink fluids to stay hydrated.  Slowly advance her diet by starting with soups, broths and crackers.  Return to the ER for fever abdominal pain or other concerning symptoms

## 2021-01-10 NOTE — ED Provider Notes (Signed)
[facility] DEPT Provider Note   CSN: [id number] Arrival date & time: 01/10/21  7209     History Chief complaint: Vomiting and diarrhea  [name] is a 80 y.o. female.  HPI   Patient presents to the ED for evaluation of vomiting and diarrhea.  Patient states symptoms started last night.  She started having multiple episodes of vomiting as well as diarrhea.  Patient thinks she may have had at least 20-25 episodes.  She is not able to keep anything down.  The diarrhea is very liquidy and more recently is just mucousy.  She has not had any known ill contacts.  She is not having any fever.  She denies any abdominal pain.  Patient states she had an episode like this once before but it was about 12 years ago.  She was told she had a bacterial infection in her colon.  She does have history of radiation therapy treatment for endometrial cancer so occasionally does have some loose stools but nothing to this extent. Patient has been vaccinated for COVID.  Past Medical History:  Diagnosis Date  . Anemia   . Arthritis   . Cataract   . Diverticulosis of colon   . Endometrial cancer ([place])    1A grade endometrial ca  . Essential hypertension 01/24/2009   Qualifier: Diagnosis of  By: Nelson-Smith CMA (AAMA), Dottie    . Hx of radiation therapy 04/24/08-05/29/08& 8/12/,8/19,&06/27/08   external beam  through 7/09 ,then intracavity in 06/2008  . Hypertension   . 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    Patient Active Problem List   Diagnosis Date Noted  . 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  . Anemia 02/01/2019  . Iron deficiency anemia 11/08/2018  . Cancer ([place])   . Enteritis 09/17/2011  . CANCER, ENDOMETRIUM 01/24/2009  . Essential hypertension 01/24/2009  . DIVERTICULOSIS OF COLON 01/24/2009  . RECTAL BLEEDING 01/24/2009  . Diarrhea 01/24/2009  . HEMORRHOIDS, HX OF 01/24/2009    Past Surgical History:  Procedure Laterality Date  . APPENDECTOMY      1967  . arthroscopic knee right surgery    . [place]  . LAPAROSCOPIC HYSTERECTOMY     total  . ORTHOPEDIC SURGERY     Orthoscopic Knee Surgery  . TEAR DUCT PROBING    . TOOTH EXTRACTION     bone graft     OB History   No obstetric history on file.     Family History  Problem Relation Age of Onset  . Diabetes type II Mother   . Breast cancer Mother   . Leukemia Father   . Diabetes type II Sister   . Breast cancer Sister   . Pancreatic cancer Maternal Aunt        x2  . Diabetes Maternal Grandmother   . Diabetes Maternal Grandfather   . Colon cancer Neg Hx   . Stomach cancer Neg Hx   . Esophageal cancer Neg Hx     Social History   Tobacco Use  . Smoking status: Never Smoker  . Smokeless tobacco: Never Used  Vaping Use  . Vaping Use: Never used  Substance Use Topics  . Alcohol use: Yes    Alcohol/week: 0.0 standard drinks    Comment: less then 2 a month  . Drug use: No    Home Medications Prior to Admission medications   Medication Sig Start Date End Date Taking? Authorizing Provider  loperamide (IMODIUM) 2 MG capsule Take 1 capsule (  2 mg total) by mouth 4 (four) times daily as needed for diarrhea or loose stools. 01/10/21  Yes Dorie Rank, MD  ondansetron (ZOFRAN ODT) 8 MG disintegrating tablet Take 1 tablet (8 mg total) by mouth every 8 (eight) hours as needed for nausea or vomiting. 01/10/21  Yes Dorie Rank, MD  b complex vitamins tablet Take 1 tablet by mouth daily.      [provider]  Bempedoic Acid-Ezetimibe (NEXLIZET) 180-10 MG TABS Take by mouth.    [provider]  calcium carbonate (OS-CAL - DOSED IN MG OF ELEMENTAL CALCIUM) 1250 MG tablet Take 1 tablet by mouth daily.      [provider]  Cholecalciferol (VITAMIN D) 2000 UNITS tablet Take 2,000 Units by mouth daily.      [provider]  Astrid Drafts Omega-3 300 MG CAPS Take 1 capsule by mouth daily.    [provider]  meclizine  (ANTIVERT) 25 MG tablet Take 25 mg by mouth as needed for dizziness. Patient not taking: Reported on 04/28/2020    [provider]  metoprolol succinate (TOPROL-XL) 25 MG 24 hr tablet Take 25 mg by mouth 2 (two) times daily.      [provider]  Multiple Vitamins-Minerals (MULTIVITAMINS THER. W/MINERALS) TABS Take 1 tablet by mouth daily.      [provider]  pantoprazole (PROTONIX) 40 MG tablet Take 40 mg by mouth daily.    [provider]  triamterene-hydrochlorothiazide (MAXZIDE-25) 37.5-25 MG tablet Take 1 tablet by mouth daily.    [provider]  Zinc 100 MG TABS Take by mouth.    [provider]    Allergies    Crestor [rosuvastatin] and Codeine  Review of Systems   Review of Systems  All other systems reviewed and are negative.   Physical Exam Updated Vital Signs BP 121/69 (BP Location: Right Arm)   Pulse (!) 113   Temp 98.2 F (36.8 C) (Oral)   Resp 18   Ht 1.6 m (5\' 3" )   Wt 76 kg   SpO2 97%   BMI 29.68 kg/m   Physical Exam Vitals and nursing note reviewed.  Constitutional:      Appearance: She is well-developed. She is not diaphoretic.  HENT:     Head: Normocephalic and atraumatic.     Right Ear: External ear normal.     Left Ear: External ear normal.     Mouth/Throat:     Mouth: Mucous membranes are dry.  Eyes:     General: No scleral icterus.       Right eye: No discharge.        Left eye: No discharge.     Conjunctiva/sclera: Conjunctivae normal.  Neck:     Trachea: No tracheal deviation.  Cardiovascular:     Rate and Rhythm: Regular rhythm. Tachycardia present.  Pulmonary:     Effort: Pulmonary effort is normal. No respiratory distress.     Breath sounds: Normal breath sounds. No stridor. No wheezing or rales.  Abdominal:     General: Bowel sounds are normal. There is no distension.     Palpations: Abdomen is soft.     Tenderness: There is no abdominal tenderness. There is no guarding or  rebound.  Musculoskeletal:        General: No tenderness.     Cervical back: Neck supple.     Comments: Trace edema bilateral lower extremities  Skin:    General: Skin is warm and dry.  Findings: No rash.  Neurological:     Mental Status: She is alert.     Cranial Nerves: No cranial nerve deficit (no facial droop, extraocular movements intact, no slurred speech).     Sensory: No sensory deficit.     Motor: No abnormal muscle tone or seizure activity.     Coordination: Coordination normal.     ED Results / Procedures / Treatments   Labs (all labs ordered are listed, but only abnormal results are displayed) Labs Reviewed  COMPREHENSIVE METABOLIC PANEL - Abnormal; Notable for the following components:      Result Value   Potassium 3.4 (*)    Glucose, Bld 141 (*)    Calcium 8.7 (*)    Alkaline Phosphatase 36 (*)    All other components within normal limits  CBC WITH DIFFERENTIAL/PLATELET - Abnormal; Notable for the following components:   MCV 79.4 (*)    MCH 25.5 (*)    RDW 16.4 (*)    Neutro Abs 8.6 (*)    Lymphs Abs 0.5 (*)    All other components within normal limits  LIPASE, BLOOD  URINALYSIS, ROUTINE W REFLEX MICROSCOPIC    EKG None  Radiology No results found.  Procedures Procedures   Medications Ordered in ED Medications  sodium chloride 0.9 % bolus 1,000 mL (1,000 mLs Intravenous Bolus 01/10/21 0914)    And  0.9 %  sodium chloride infusion (0 mLs Intravenous Hold 01/10/21 1011)  ondansetron (ZOFRAN) injection 4 mg (4 mg Intravenous Given 01/10/21 0912)  loperamide (IMODIUM) capsule 4 mg (4 mg Oral Given 01/10/21 0913)    ED Course  I have reviewed the triage vital signs and the nursing notes.  Pertinent labs & imaging results that were available during my care of the patient were reviewed by me and considered in my medical decision making (see chart for details).    MDM Rules/Calculators/A&P                          Patient presented to the ED for  evaluation nausea vomiting and diarrhea.  In the ED patient was treated with IV fluids and antiemetics.  She was also given antidiarrheal agents.  Patient symptoms improved and she is now able to tolerate crackers and ginger ale without any vomiting or discomfort.  Laboratory tests are reassuring.  No leukocytosis.  No signs of severe dehydration.  I suspect patient symptoms are related to viral gastroenteritis.  Doubt diverticulitis obstruction colitis.  Will discharge home with antiemetics and antidiarrheal agents.  Patient instructed return for pain fever or other worsening symptoms. Final Clinical Impression(s) / ED Diagnoses Final diagnoses:  Gastroenteritis    Rx / DC Orders ED Discharge Orders         Ordered    ondansetron (ZOFRAN ODT) 8 MG disintegrating tablet  Every 8 hours PRN        01/10/21 1038    loperamide (IMODIUM) 2 MG capsule  4 times daily PRN        01/10/21 1038           Dorie Rank, MD 01/10/21 1041

## 2021-01-10 NOTE — ED Notes (Signed)
Patient given gingerale and saltine crackers for PO challenge

## 2021-01-10 NOTE — ED Triage Notes (Signed)
Patient complains of nausea, emesis and diarrhea since last night. Fully vaccinated and boosted against COVID-19. Unable to quantify episodes, just states she has had a lot of them. Alert, oriented and ambulatory. Denies recent sick contacts.

## 2021-01-13 ENCOUNTER — Emergency Department (HOSPITAL_BASED_OUTPATIENT_CLINIC_OR_DEPARTMENT_OTHER)
Admission: EM | Admit: 2021-01-13 | Discharge: 2021-01-13 | Disposition: A | Discharge status: Home / Self Care | Payer: PPO | Attending: Emergency Medicine | Admitting: Emergency Medicine

## 2021-01-13 ENCOUNTER — Other Ambulatory Visit: Payer: Self-pay

## 2021-01-13 DIAGNOSIS — I10 Essential (primary) hypertension: Secondary | ICD-10-CM | POA: Diagnosis not present

## 2021-01-13 DIAGNOSIS — Z79899 Other long term (current) drug therapy: Secondary | ICD-10-CM | POA: Diagnosis not present

## 2021-01-13 DIAGNOSIS — Z8542 Personal history of malignant neoplasm of other parts of uterus: Secondary | ICD-10-CM | POA: Diagnosis not present

## 2021-01-13 DIAGNOSIS — E876 Hypokalemia: Secondary | ICD-10-CM | POA: Insufficient documentation

## 2021-01-13 DIAGNOSIS — R197 Diarrhea, unspecified: Secondary | ICD-10-CM | POA: Insufficient documentation

## 2021-01-13 LAB — CBC WITH DIFFERENTIAL/PLATELET
Abs Immature Granulocytes: 0.02 10*3/uL (ref 0.00–0.07)
Basophils Absolute: 0 10*3/uL (ref 0.0–0.1)
Basophils Relative: 0 %
Eosinophils Absolute: 0 10*3/uL (ref 0.0–0.5)
Eosinophils Relative: 1 %
HCT: 34 % — ABNORMAL LOW (ref 36.0–46.0)
Hemoglobin: 11.1 g/dL — ABNORMAL LOW (ref 12.0–15.0)
Immature Granulocytes: 0 %
Lymphocytes Relative: 16 %
Lymphs Abs: 0.9 10*3/uL (ref 0.7–4.0)
MCH: 26.1 pg (ref 26.0–34.0)
MCHC: 32.6 g/dL (ref 30.0–36.0)
MCV: 79.8 fL — ABNORMAL LOW (ref 80.0–100.0)
Monocytes Absolute: 0.8 10*3/uL (ref 0.1–1.0)
Monocytes Relative: 15 %
Neutro Abs: 3.8 10*3/uL (ref 1.7–7.7)
Neutrophils Relative %: 68 %
Platelets: 197 10*3/uL (ref 150–400)
RBC: 4.26 MIL/uL (ref 3.87–5.11)
RDW: 16.9 % — ABNORMAL HIGH (ref 11.5–15.5)
WBC: 5.6 10*3/uL (ref 4.0–10.5)
nRBC: 0 % (ref 0.0–0.2)

## 2021-01-13 LAB — BASIC METABOLIC PANEL
Anion gap: 11 (ref 5–15)
BUN: 11 mg/dL (ref 8–23)
CO2: 21 mmol/L — ABNORMAL LOW (ref 22–32)
Calcium: 7.2 mg/dL — ABNORMAL LOW (ref 8.9–10.3)
Chloride: 104 mmol/L (ref 98–111)
Creatinine, Ser: 0.88 mg/dL (ref 0.44–1.00)
GFR, Estimated: >60 mL/min (ref >60)
Glucose, Bld: 107 mg/dL — ABNORMAL HIGH (ref 70–99)
Potassium: 3 mmol/L — ABNORMAL LOW (ref 3.5–5.1)
Sodium: 136 mmol/L (ref 135–145)

## 2021-01-13 MED ORDER — LACTATED RINGERS IV BOLUS
1000.0000 mL | Freq: Once | INTRAVENOUS | Status: AC
  Administered 2021-01-13: 1000 mL via INTRAVENOUS

## 2021-01-13 MED ORDER — POTASSIUM CHLORIDE CRYS ER 20 MEQ PO TBCR
40.0000 meq | EXTENDED_RELEASE_TABLET | Freq: Once | ORAL | Status: AC
  Administered 2021-01-13: 40 meq via ORAL
  Filled 2021-01-13: qty 2

## 2021-01-13 MED ORDER — DIPHENOXYLATE-ATROPINE 2.5-0.025 MG PO TABS: 1.0000 | ORAL_TABLET | Freq: Four times a day (QID) | ORAL | 0 refills | Status: DC | PRN

## 2021-01-13 MED ORDER — POTASSIUM CHLORIDE CRYS ER 20 MEQ PO TBCR: 20.0000 meq | EXTENDED_RELEASE_TABLET | Freq: Every day | ORAL | 0 refills | Status: DC

## 2021-01-13 NOTE — ED Notes (Signed)
Vital signs stable. 

## 2021-01-13 NOTE — ED Provider Notes (Signed)
[facility] Provider Note: Georgena Spurling, MD, FACEP  CSN: [id number] MRN: [id number] ARRIVAL: 01/13/21 at [place]: [place]  Diarrhea   HISTORY OF PRESENT ILLNESS  01/13/21 2:45 AM [name] is a 80 y.o. female who developed nausea, vomiting and diarrhea 4 days ago.  She was seen at [facility] and treated for gastroenteritis.  She had abdominal cramping at that time.  The nausea, vomiting and abdominal cramping have resolved but diarrhea persists.  She describes it as profuse, watery and with mucus and a foul smell but no blood.  It has not responded to Imodium.  She was treated with amoxicillin for a dental problem around 12/25/2020.   Past Medical History:  Diagnosis Date  . Anemia   . Arthritis   . Cataract   . Diverticulosis of colon   . Endometrial cancer ([place])    1A grade endometrial ca  . Essential hypertension 01/24/2009   Qualifier: Diagnosis of  By: Nelson-Smith CMA (AAMA), Dottie    . Hx of radiation therapy 04/24/08-05/29/08& 8/12/,8/19,&06/27/08   external beam  through 7/09 ,then intracavity in 06/2008  . Hypertension   . 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    Past Surgical History:  Procedure Laterality Date  . APPENDECTOMY     1967  . arthroscopic knee right surgery    . [place]  . LAPAROSCOPIC HYSTERECTOMY     total  . ORTHOPEDIC SURGERY     Orthoscopic Knee Surgery  . TEAR DUCT PROBING    . TOOTH EXTRACTION     bone graft    Family History  Problem Relation Age of Onset  . Diabetes type II Mother   . Breast cancer Mother   . Leukemia Father   . Diabetes type II Sister   . Breast cancer Sister   . Pancreatic cancer Maternal Aunt        x2  . Diabetes Maternal Grandmother   . Diabetes Maternal Grandfather   . Colon cancer Neg Hx   . Stomach cancer Neg Hx   . Esophageal cancer Neg Hx     Social History   Tobacco Use  . Smoking status: Never Smoker  . Smokeless  tobacco: Never Used  Vaping Use  . Vaping Use: Never used  Substance Use Topics  . Alcohol use: Yes    Alcohol/week: 0.0 standard drinks    Comment: less then 2 a month  . Drug use: No    Prior to Admission medications   Medication Sig Start Date End Date Taking? Authorizing Provider  diphenoxylate-atropine (LOMOTIL) 2.5-0.025 MG tablet Take 1-2 tablets by mouth 4 (four) times daily as needed for diarrhea or loose stools. 01/13/21  Yes Zakyla Tonche, MD  potassium chloride SA (KLOR-CON) 20 MEQ tablet Take 1 tablet (20 mEq total) by mouth daily. 01/13/21  Yes Renesmae Donahey, MD  b complex vitamins tablet Take 1 tablet by mouth daily.      [provider]  Bempedoic Acid-Ezetimibe (NEXLIZET) 180-10 MG TABS Take by mouth.    [provider]  calcium carbonate (OS-CAL - DOSED IN MG OF ELEMENTAL CALCIUM) 1250 MG tablet Take 1 tablet by mouth daily.      [provider]  Cholecalciferol (VITAMIN D) 2000 UNITS tablet Take 2,000 Units by mouth daily.      [provider]  Astrid Drafts Omega-3 300 MG CAPS Take 1 capsule by mouth daily.    [provider]  loperamide (IMODIUM) 2 MG capsule Take 1 capsule (2 mg total) by mouth 4 (four) times daily as needed for diarrhea or loose stools. 01/10/21   Dorie Rank, MD  meclizine (ANTIVERT) 25 MG tablet Take 25 mg by mouth as needed for dizziness. Patient not taking: Reported on 04/28/2020    [provider]  metoprolol succinate (TOPROL-XL) 25 MG 24 hr tablet Take 25 mg by mouth 2 (two) times daily.      [provider]  Multiple Vitamins-Minerals (MULTIVITAMINS THER. W/MINERALS) TABS Take 1 tablet by mouth daily.      [provider]  ondansetron (ZOFRAN ODT) 8 MG disintegrating tablet Take 1 tablet (8 mg total) by mouth every 8 (eight) hours as needed for nausea or vomiting. 01/10/21   Dorie Rank, MD  pantoprazole (PROTONIX) 40 MG tablet Take 40 mg by mouth daily.    [provider]   triamterene-hydrochlorothiazide (MAXZIDE-25) 37.5-25 MG tablet Take 1 tablet by mouth daily.    [provider]  Zinc 100 MG TABS Take by mouth.    [provider]    Allergies Crestor [rosuvastatin] and Codeine   REVIEW OF SYSTEMS  Negative except as noted here or in the History of Present Illness.   PHYSICAL EXAMINATION  Initial Vital Signs Blood pressure 109/74, pulse 74, temperature 99.3 F (37.4 C), temperature source Oral, resp. rate 18, height 5\' 7"  (1.702 m), weight 76 kg, SpO2 98 %.  Examination General: Well-developed, well-nourished female in no acute distress; appearance consistent with age of record HENT: normocephalic; atraumatic Eyes: pupils equal, round and reactive to light; extraocular muscles intact Neck: supple Heart: regular rate and rhythm Lungs: clear to auscultation bilaterally Abdomen: soft; nondistended; nontender; no masses or hepatosplenomegaly; bowel sounds present Extremities: No deformity; full range of motion; pulses normal Neurologic: Awake, alert and oriented; motor function intact in all extremities and symmetric; no facial droop Skin: Warm and dry Psychiatric: Normal mood and affect   RESULTS  Summary of this visit's results, reviewed and interpreted by myself:   EKG Interpretation  Date/Time:    Ventricular Rate:    PR Interval:    QRS Duration:   QT Interval:    QTC Calculation:   R Axis:     Text Interpretation:        Laboratory Studies: Results for orders placed or performed during the hospital encounter of 01/13/21 (from the past 24 hour(s))  CBC with Differential/Platelet     Status: Abnormal   Collection Time: 01/13/21  2:38 AM  Result Value Ref Range   WBC 5.6 4.0 - 10.5 K/uL   RBC 4.26 3.87 - 5.11 MIL/uL   Hemoglobin 11.1 (L) 12.0 - 15.0 g/dL   HCT 34.0 (L) 36.0 - 46.0 %   MCV 79.8 (L) 80.0 - 100.0 fL   MCH 26.1 26.0 - 34.0 pg   MCHC 32.6 30.0 - 36.0 g/dL   RDW 16.9 (H) 11.5 - 15.5 %    Platelets 197 150 - 400 K/uL   nRBC 0.0 0.0 - 0.2 %   Neutrophils Relative % 68 %   Neutro Abs 3.8 1.7 - 7.7 K/uL   Lymphocytes Relative 16 %   Lymphs Abs 0.9 0.7 - 4.0 K/uL   Monocytes Relative 15 %   Monocytes Absolute 0.8 0.1 - 1.0 K/uL   Eosinophils Relative 1 %   Eosinophils Absolute 0.0 0.0 - 0.5 K/uL   Basophils Relative 0 %   Basophils Absolute 0.0 0.0 - 0.1 K/uL  Immature Granulocytes 0 %   Abs Immature Granulocytes 0.02 0.00 - 0.07 K/uL  Basic metabolic panel     Status: Abnormal   Collection Time: 01/13/21  2:38 AM  Result Value Ref Range   Sodium 136 135 - 145 mmol/L   Potassium 3.0 (L) 3.5 - 5.1 mmol/L   Chloride 104 98 - 111 mmol/L   CO2 21 (L) 22 - 32 mmol/L   Glucose, Bld 107 (H) 70 - 99 mg/dL   BUN 11 8 - 23 mg/dL   Creatinine, Ser 0.88 0.44 - 1.00 mg/dL   Calcium 7.2 (L) 8.9 - 10.3 mg/dL   GFR, Estimated >60 >60 mL/min   Anion gap 11 5 - 15   Imaging Studies: No results found.  ED COURSE and MDM  Nursing notes, initial and subsequent vitals signs, including pulse oximetry, reviewed and interpreted by myself.  Vitals:   01/13/21 0415 01/13/21 0422 01/13/21 0430 01/13/21 0445  BP:      Pulse: 80 73 67 65  Resp:      Temp:      TempSrc:      SpO2: 94% 94% 94% 97%  Weight:      Height:       Medications  potassium chloride SA (KLOR-CON) CR tablet 40 mEq (has no administration in time range)  lactated ringers bolus 1,000 mL (1,000 mLs Intravenous New Bag/Given 01/13/21 0257)   5:21 AM Patient feeling better after IV fluid bolus.  She has not been able to provide a stool specimen in the ED.  We will have her collect at home and return to the lab.   PROCEDURES  Procedures   ED DIAGNOSES     ICD-10-CM   1. Watery diarrhea  R19.7   2. Hypokalemia due to loss of potassium  E87.6        Alfonse Garringer, Jenny Reichmann, MD 01/13/21 [phone]

## 2021-01-15 LAB — GASTROINTESTINAL PANEL BY PCR, STOOL (REPLACES STOOL CULTURE)

## 2021-01-22 DIAGNOSIS — A080 Rotaviral enteritis: Secondary | ICD-10-CM | POA: Diagnosis not present

## 2021-01-22 DIAGNOSIS — R42 Dizziness and giddiness: Secondary | ICD-10-CM | POA: Diagnosis not present

## 2021-01-27 DIAGNOSIS — H2511 Age-related nuclear cataract, right eye: Secondary | ICD-10-CM | POA: Diagnosis not present

## 2021-01-27 DIAGNOSIS — H25811 Combined forms of age-related cataract, right eye: Secondary | ICD-10-CM | POA: Diagnosis not present

## 2021-01-27 DIAGNOSIS — H25041 Posterior subcapsular polar age-related cataract, right eye: Secondary | ICD-10-CM | POA: Diagnosis not present

## 2021-01-27 DIAGNOSIS — H25011 Cortical age-related cataract, right eye: Secondary | ICD-10-CM | POA: Diagnosis not present

## 2021-03-03 DIAGNOSIS — H25812 Combined forms of age-related cataract, left eye: Secondary | ICD-10-CM | POA: Diagnosis not present

## 2021-03-03 DIAGNOSIS — H2512 Age-related nuclear cataract, left eye: Secondary | ICD-10-CM | POA: Diagnosis not present

## 2021-04-01 DIAGNOSIS — Z961 Presence of intraocular lens: Secondary | ICD-10-CM | POA: Diagnosis not present

## 2021-04-29 ENCOUNTER — Ambulatory Visit: Payer: PPO | Admitting: Cardiology

## 2021-04-29 ENCOUNTER — Encounter: Payer: Self-pay | Admitting: Cardiology

## 2021-04-29 ENCOUNTER — Other Ambulatory Visit: Payer: Self-pay

## 2021-04-29 VITALS — BP 108/67 | HR 67 | Temp 98.0°F | Resp 17 | Ht 67.0 in | Wt 167.4 lb

## 2021-04-29 DIAGNOSIS — I10 Essential (primary) hypertension: Secondary | ICD-10-CM | POA: Diagnosis not present

## 2021-04-29 DIAGNOSIS — E7800 Pure hypercholesterolemia, unspecified: Secondary | ICD-10-CM

## 2021-04-29 DIAGNOSIS — I071 Rheumatic tricuspid insufficiency: Secondary | ICD-10-CM

## 2021-04-29 DIAGNOSIS — I340 Nonrheumatic mitral (valve) insufficiency: Secondary | ICD-10-CM

## 2021-04-29 DIAGNOSIS — R0609 Other forms of dyspnea: Secondary | ICD-10-CM | POA: Diagnosis not present

## 2021-04-29 NOTE — Progress Notes (Addendum)
Primary Physician/Referring:  Deland Pretty, MD  Patient ID: [name], female    DOB: 05-Oct-1941, 80 y.o.   MRN: [id number]  Chief Complaint  Patient presents with   Follow-up    1 YEAR   Mitral Regurgitation   Shortness of Breath   HPI:    [name]  is a 80 y.o. Caucasian female patient with Essential hypertension, history of diverticulosis, endometrial cancer in 2009 treated with surgery and radiation therapy,  moderate MR and mild to moderate TR, chronic dyspnea on exertion, iron deficiency anemia with prior GI evaluation revealing gastritis in 2019, continues to have iron deficiency anemia and has previously seen Dr. Payton Mccallum presents here for 1 year follow-up.  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   No change in dyspnea, she recently had rotavirus infection and needed to be in the emergency room twice for IV hydration.  Otherwise no new symptomatology.  Past Medical History:  Diagnosis Date   Anemia    Arthritis    Cataract    Diverticulosis of colon    Endometrial cancer ([place])    1A grade endometrial ca   Essential hypertension 01/24/2009   Qualifier: Diagnosis of  By: Nelson-Smith CMA (AAMA), Dottie     Hx of radiation therapy 04/24/08-05/29/08& 8/12/,8/19,&06/27/08   external beam  through 7/09 ,then intracavity in 06/2008   Hypertension    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   Past Surgical History:  Procedure Laterality Date   APPENDECTOMY     1967   arthroscopic knee right surgery     ECTOPIC PREGNANCY SURGERY  1967   LAPAROSCOPIC HYSTERECTOMY     total   ORTHOPEDIC SURGERY     Orthoscopic Knee Surgery   TEAR DUCT PROBING     TOOTH EXTRACTION     bone graft   Social History   Tobacco Use   Smoking status: Never   Smokeless tobacco: Never  Substance Use Topics   Alcohol use: Yes    Alcohol/week: 0.0 standard drinks    Comment: less then 2  a month   ROS  Review of Systems  Constitutional: Negative for malaise/fatigue and weight gain.  Cardiovascular:  Positive for dyspnea on exertion. Negative for chest pain and leg swelling.  Gastrointestinal:  Positive for diarrhea (chronic from radiation proctitis). Negative for hematochezia and melena.  Neurological:  Negative for dizziness and headaches.  Objective  Blood pressure 108/67, pulse 67, temperature 98 F (36.7 C), temperature source Temporal, resp. rate 17, height _0  (1.702 m), weight 167 lb 6.4 oz (75.9 kg), SpO2 96 %.  Vitals with BMI 04/29/2021 01/13/2021 01/13/2021  Height _1  - -  Weight 167 lbs 6 oz - -  BMI 74.94 - -  Systolic 496 - -  Diastolic 67 - -  Pulse 67 65 67     Physical Exam Constitutional:      Comments: She is moderately built and well nurished  Neck:     Vascular: No carotid bruit or JVD.  Cardiovascular:     Rate and Rhythm: Normal rate and regular rhythm.     Pulses: Intact distal pulses.     Heart sounds: S1 normal and S2 normal. Murmur heard.  Crescendo-decrescendo mid to late systolic murmur is present with a grade of 3/6 at the lower left sternal border and apex.    No gallop.  Pulmonary:     Effort:  Pulmonary effort is normal.     Breath sounds: Normal breath sounds.  Abdominal:     General: Bowel sounds are normal.     Palpations: Abdomen is soft.   Laboratory examination:   Recent Labs    01/10/21 0908 01/13/21 0238 04/29/21 1102  NA 139 136 137  K 3.4* 3.0* 4.9  CL 104 104 103  CO2 24 21* 23  GLUCOSE 141* 107* 90  BUN _0 CREATININE 0.88 0.88 1.13*  CALCIUM 8.7* 7.2* 9.3  GFRNONAA >60 >60  --    estimated creatinine clearance is 44.3 mL/min (A) (by C-G formula based on SCr of 1.13 mg/dL (H)).  CMP Latest Ref Rng & Units 04/29/2021 01/13/2021 01/10/2021  Glucose 65 - 99 mg/dL 90 107(H) 141(H)  BUN 8 - 27 mg/dL _1 Creatinine 0.57 - 1.00 mg/dL 1.13(H) 0.88 0.88  Sodium 134 - 144 mmol/L 137 136 139   Potassium 3.5 - 5.2 mmol/L 4.9 3.0(L) 3.4(L)  Chloride 96 - 106 mmol/L 103 104 104  CO2 20 - 29 mmol/L 23 21(L) 24  Calcium 8.7 - 10.3 mg/dL 9.3 7.2(L) 8.7(L)  Total Protein 6.0 - 8.5 g/dL 6.4 - 7.0  Total Bilirubin 0.0 - 1.2 mg/dL 0.5 - 1.0  Alkaline Phos 44 - 121 IU/L 39(L) - 36(L)  AST 0 - 40 IU/L 30 - 30  ALT 0 - 32 IU/L 19 - 23   CBC Latest Ref Rng & Units 01/13/2021 01/10/2021 01/15/2020  WBC 4.0 - 10.5 K/uL 5.6 10.1 8.3  Hemoglobin 12.0 - 15.0 g/dL 11.1(L) 12.0 13.3  Hematocrit 36.0 - 46.0 % 34.0(L) 37.3 39.3  Platelets 150 - 400 K/uL 197 253 222.0   External labs:  Labs 01/22/2021:  Hb 10.9/HCT 33.8, platelets 157, normal indicis.  Serum glucose 93 mg, BUN 17, creatinine 1.05, EGFR 55 mill, potassium 4.6, CMP otherwise normal.  Labs 06/19/2020:  Total cholesterol 109, triglycerides 106, HDL 38, LDL 51.  Cholesterol, total 171.000 M 10/23/2019 HDL 56.000 M 10/23/2019 LDL-C 83.000 M 10/23/2018 Triglycerides 96.000 M 10/23/2019  Medications and allergies   Allergies  Allergen Reactions   Crestor [Rosuvastatin] Other (See Comments)    Leg cramps   Codeine Other (See Comments)    Hyper    Current Outpatient Medications  Medication Instructions   b complex vitamins tablet 1 tablet, Daily   Bempedoic Acid-Ezetimibe (NEXLIZET) 180-10 MG TABS Oral   calcium carbonate (OS-CAL - DOSED IN MG OF ELEMENTAL CALCIUM) 1250 MG tablet 1 tablet, Daily   denosumab (PROLIA) 60 MG/ML SOSY injection Subcutaneous, See admin instructions   diphenoxylate-atropine (LOMOTIL) 2.5-0.025 MG tablet 1-2 tablets, Oral, 4 times daily PRN   Garlic 2800 MG CAPS 1 capsule, Oral, Daily   Krill Oil Omega-3 300 MG CAPS 1 capsule, Daily   loperamide (IMODIUM) 2 mg, Oral, 4 times daily PRN   meclizine (ANTIVERT) 25 mg, Oral, As needed   metoprolol succinate (TOPROL-XL) 25 mg, 2 times daily   Multiple Vitamins-Minerals (MULTIVITAMINS THER. W/MINERALS) TABS 1 tablet, Daily   pantoprazole (PROTONIX) 40  mg, Oral, Daily   triamterene-hydrochlorothiazide (MAXZIDE-25) 37.5-25 MG tablet 1 tablet, Oral, Daily   Vitamin D 2,000 Units, Daily   Zinc 100 MG TABS Oral   Radiology:  No results found.  Cardiac Studies:   Chest x-ray 12/30/11/2016: Negative two view chest x-ray  CT colonoscopy 01/04/2018: Mild colonic diverticulosis. No fixed polypoid filling defects or annular constricting lesions. No acute extra colonic abnormality in the abdomen or  pelvis. Aortic atherosclerosis.  Lexiscan Tetrofosmin Stress Test  11/12/2019: Nondiagnostic ECG stress. Mild soft tissue attenuation noted in the inferior wall. No ischemia or scar. Stress LV EF: 70%.  No previous exam available for comparison. Low risk study.  Echocardiogram 11/23/2019:  Left ventricle cavity is normal in size and wall thickness. Normal global wall motion. Normal LV systolic function with EF 56%. Normal diastolic filling pattern. Calculated EF 56%.  Left atrial cavity is moderately dilated. LA vol index 48 cc/m2.  Trileaflet aortic valve.  Mild (Grade I) aortic regurgitation.  Moderate (Grade III) mitral regurgitation.  Moderate tricuspid regurgitation. Mild pulmonary hypertension. Estimated pulmonary artery systolic pressure is 38 mmHg.  No significant change compared to previous study on 11/11/2017.  EKG  EKG 04/29/2021: Normal sinus rhythm at rate of 63 bpm, normal axis.  No evidence of ischemia, normal EKG.  No significant change from EKG 11/09/2019   Assessment     ICD-10-CM   1. Moderate mitral regurgitation  I34.0 PCV ECHOCARDIOGRAM COMPLETE    2. Moderate tricuspid regurgitation  I07.1 PCV ECHOCARDIOGRAM COMPLETE    3. Dyspnea on exertion  R06.00     4. Essential hypertension  I10 EKG 12-Lead    CMP14+EGFR    TSH    Basic metabolic panel    Basic metabolic panel    5. Hypercholesteremia  E78.00 Lipid Panel With LDL/HDL Ratio      Recommendations:  No orders of the defined types were placed in this  encounter.   [name]  is a 80 y.o. Caucasian female patient with Essential hypertension, history of diverticulosis, endometrial cancer in 2009 treated with surgery and radiation therapy,  moderate MR and mild to moderate TR, chronic dyspnea on exertion, iron deficiency anemia with prior GI evaluation revealing gastritis in 2019, continues to have iron deficiency anemia and has previously seen Dr. Payton Mccallum presents here for 1 year follow-up.  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   Mitral regurgitation murmur appears slightly more pronounced, no clinical evidence of heart failure, and patient states that her dyspnea has remained stable.  Will obtain echocardiogram follow-up of MR and TR.  Blood pressures well controlled.  Due to aortic atherosclerosis, she was started on  Nexlizet which she is tolerating well, needs lipid profile testing.  At her request orders placed.  I will also obtain a CMP as her potassium levels were low in the last test that was performed.  I do not have a TSH as well.  Unless echocardiogram reveals marked abnormality, I will see her back in a year.  Addendum: Lipids evaluated, mild triglyceride elevation is new.  We will continue to observe this for now.  Renal dysfunction is new, will recheck BMP in 1 month.  MyChart message sent to the patient.   Adrian Prows, MD, [facility] 04/30/2021, 9:54 AM Office: [phone] Fax: [phone] Pager: [phone]

## 2021-04-30 LAB — TSH: TSH: 2.33 u[IU]/mL (ref 0.450–4.500)

## 2021-04-30 LAB — LIPID PANEL WITH LDL/HDL RATIO
Cholesterol, Total: 104 mg/dL (ref 100–199)
HDL: 38 mg/dL — ABNORMAL LOW (ref >39)
LDL Chol Calc (NIH): 39 mg/dL (ref 0–99)
LDL/HDL Ratio: 1 ratio (ref 0.0–3.2)
Triglycerides: 157 mg/dL — ABNORMAL HIGH (ref 0–149)
VLDL Cholesterol Cal: 27 mg/dL (ref 5–40)

## 2021-04-30 LAB — CMP14+EGFR
ALT: 19 IU/L (ref 0–32)
AST: 30 IU/L (ref 0–40)
Albumin/Globulin Ratio: 2.2 (ref 1.2–2.2)
Albumin: 4.4 g/dL (ref 3.7–4.7)
Alkaline Phosphatase: 39 IU/L — ABNORMAL LOW (ref 44–121)
BUN/Creatinine Ratio: 20 (ref 12–28)
BUN: 23 mg/dL (ref 8–27)
Bilirubin Total: 0.5 mg/dL (ref 0.0–1.2)
CO2: 23 mmol/L (ref 20–29)
Calcium: 9.3 mg/dL (ref 8.7–10.3)
Chloride: 103 mmol/L (ref 96–106)
Creatinine, Ser: 1.13 mg/dL — ABNORMAL HIGH (ref 0.57–1.00)
Globulin, Total: 2 g/dL (ref 1.5–4.5)
Glucose: 90 mg/dL (ref 65–99)
Potassium: 4.9 mmol/L (ref 3.5–5.2)
Sodium: 137 mmol/L (ref 134–144)
Total Protein: 6.4 g/dL (ref 6.0–8.5)
eGFR: 50 mL/min/{1.73_m2} — ABNORMAL LOW (ref >59)

## 2021-04-30 NOTE — Progress Notes (Signed)
Lipids reviewed, mild hypertriglyceridemia, LDL is well controlled. Serum glucose mildly elevated, kidney function has mildly decreased compared to 3 months ago.  Compared to 06/19/2020, elevated triglycerides, previously well controlled.  No new medications were added or deleted on a recent office visit yesterday.  TSH normal.

## 2021-04-30 NOTE — Addendum Note (Signed)
Addended by: Kela Millin on: 04/30/2021 09:55 AM   Modules accepted: Orders

## 2021-05-21 DIAGNOSIS — M810 Age-related osteoporosis without current pathological fracture: Secondary | ICD-10-CM | POA: Diagnosis not present

## 2021-05-28 ENCOUNTER — Other Ambulatory Visit: Payer: Self-pay

## 2021-05-28 ENCOUNTER — Ambulatory Visit: Payer: PPO

## 2021-05-28 DIAGNOSIS — I340 Nonrheumatic mitral (valve) insufficiency: Secondary | ICD-10-CM

## 2021-05-28 DIAGNOSIS — I071 Rheumatic tricuspid insufficiency: Secondary | ICD-10-CM | POA: Diagnosis not present

## 2021-07-26 IMAGING — CT CT ABD-PELV W/ CM
3 of 5 series · 15 of 46 positions shown, 17 images · IV contrast (omnipaque)
Comparison: CT colonoscopy 01/04/2018

CLINICAL DATA: Left lower quadrant abdominal pain and diarrhea.

EXAM:
CT ABDOMEN AND PELVIS WITH CONTRAST
TECHNIQUE: Multidetector CT imaging of the abdomen and pelvis was performed
using the standard protocol following bolus administration of
intravenous contrast.
CONTRAST:  80mL OMNIPAQUE IOHEXOL 300 MG/ML  SOLN

[Series 2: axial st · axial · 0.76mm/px · z∈[-636,-271]mm · 10 of 91 slices shown, 12 images]
[im 9/91  soft-tissue]
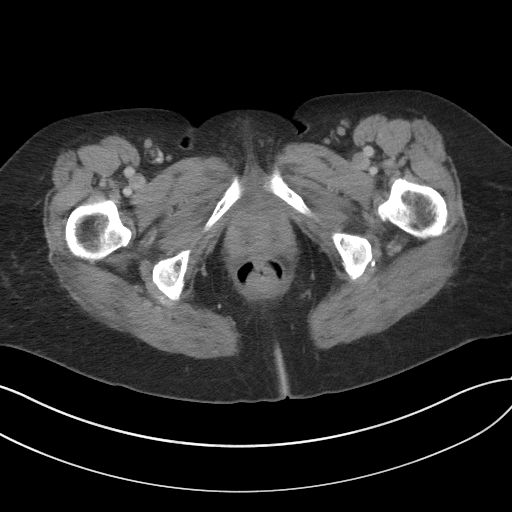
[im 9/91  bone]
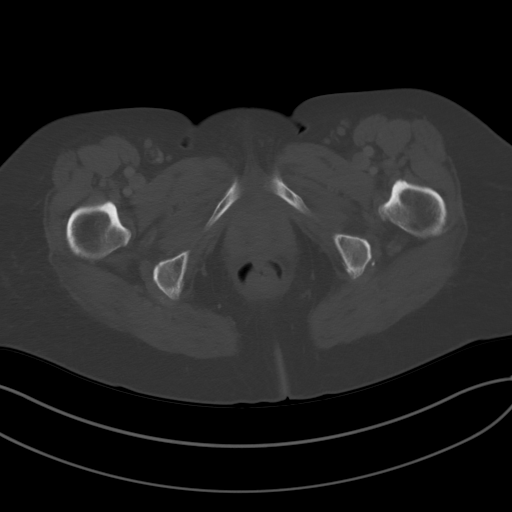
[im 17/91  soft-tissue]
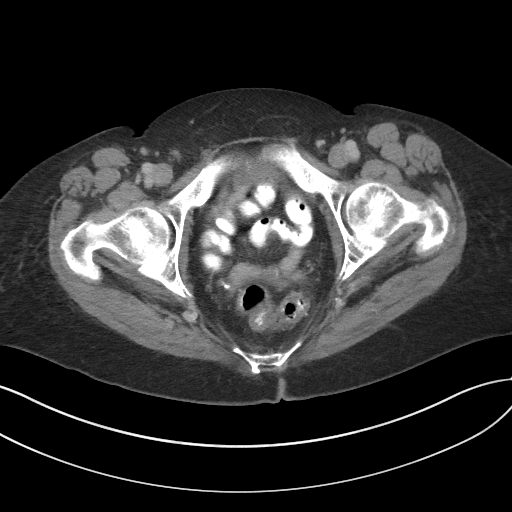
[im 25/91  soft-tissue]
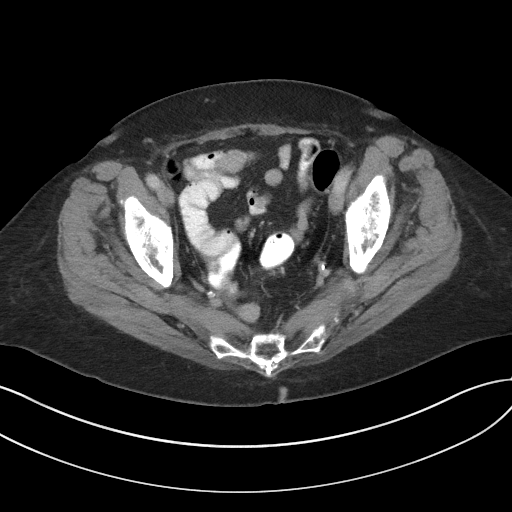
[im 33/91  soft-tissue]
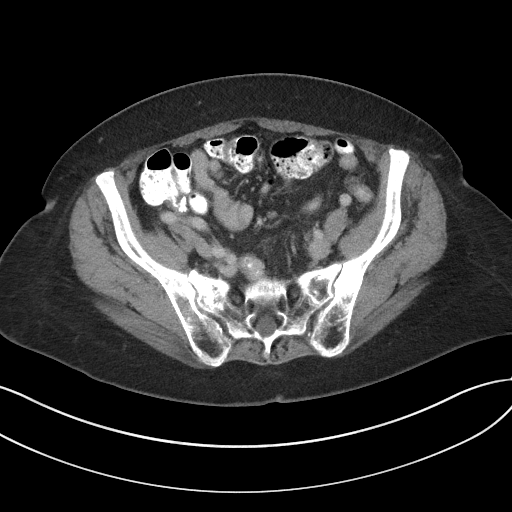
[im 41/91  soft-tissue]
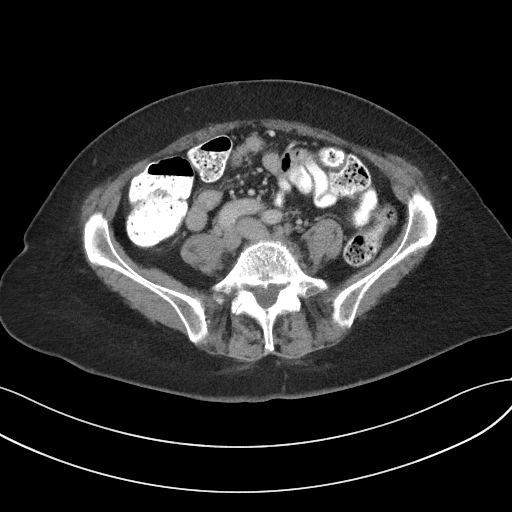
[im 50/91  soft-tissue]
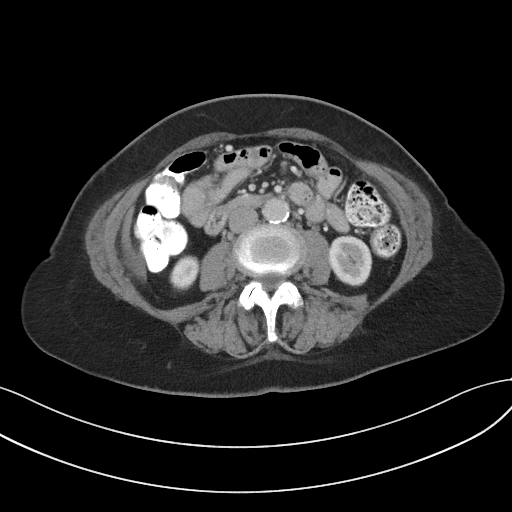
[im 58/91  soft-tissue]
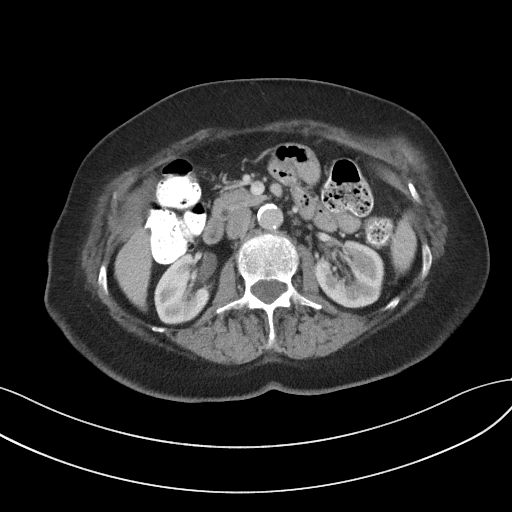
[im 66/91  soft-tissue]
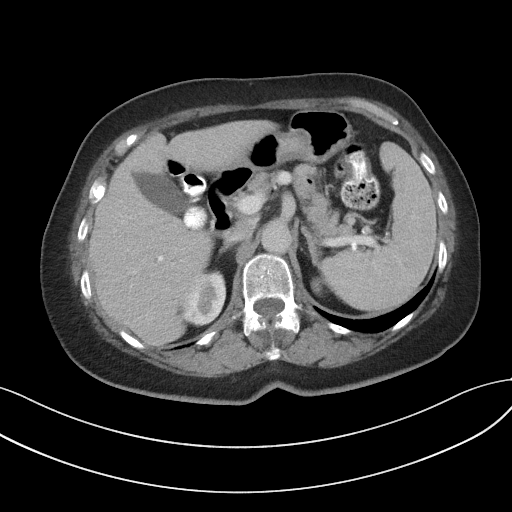
[im 74/91  soft-tissue]
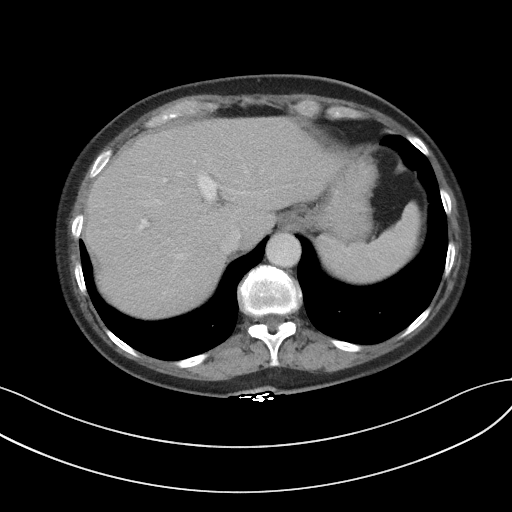
[im 74/91  bone]
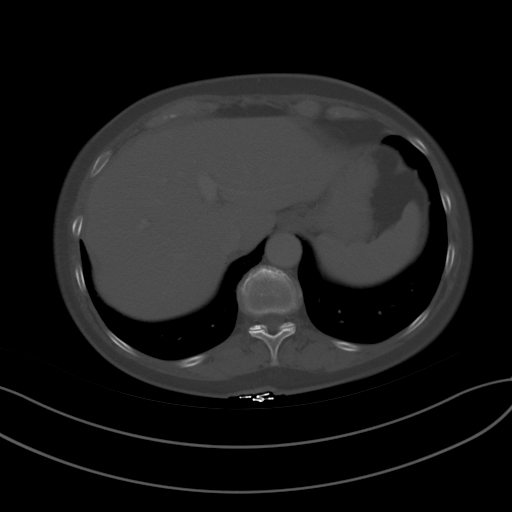
[im 82/91  soft-tissue]
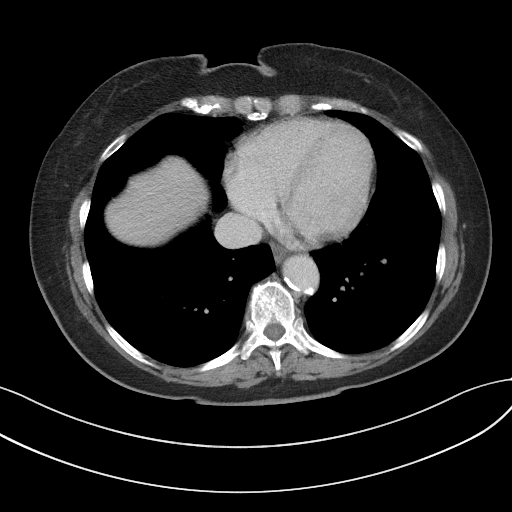

[Series 4: lung bases · axial · 0.76mm/px · z∈[-362,-346]mm · 2 of 77 slices shown]
[im 9/77  bone]
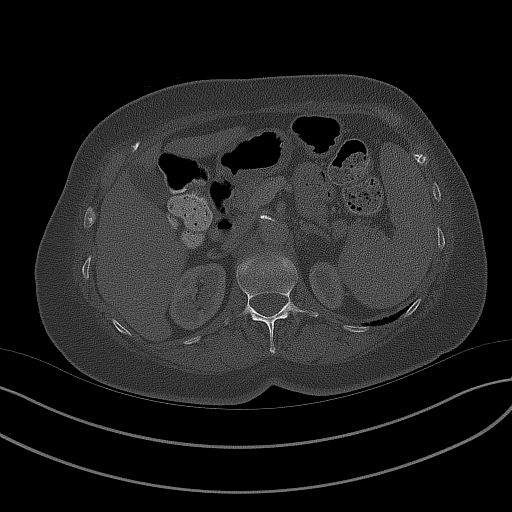
[im 17/77  bone]
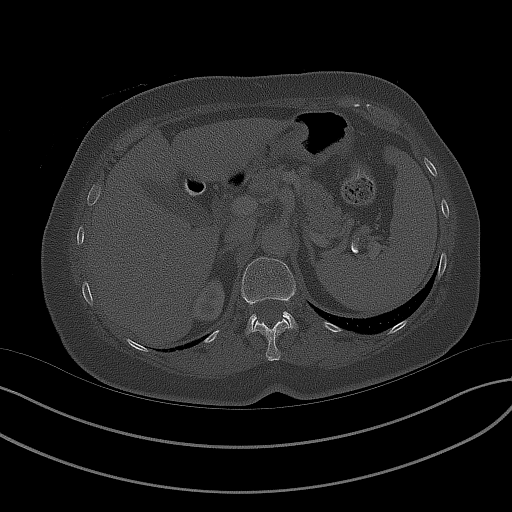

[Series 5: coronal st · coronal · 0.68mm/px · 3 of 101 slices shown]
[im 34/101  soft-tissue]
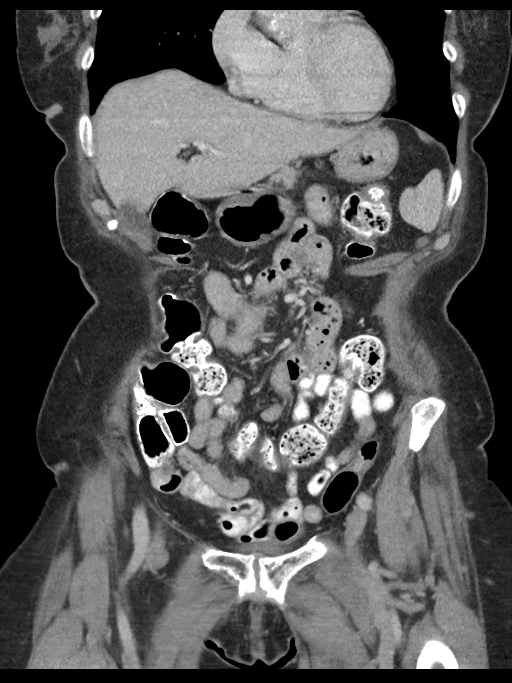
[im 45/101  soft-tissue]
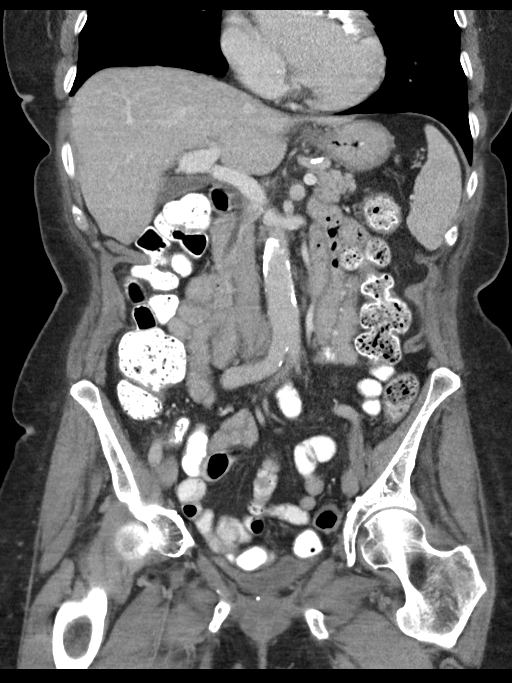
[im 56/101  soft-tissue]
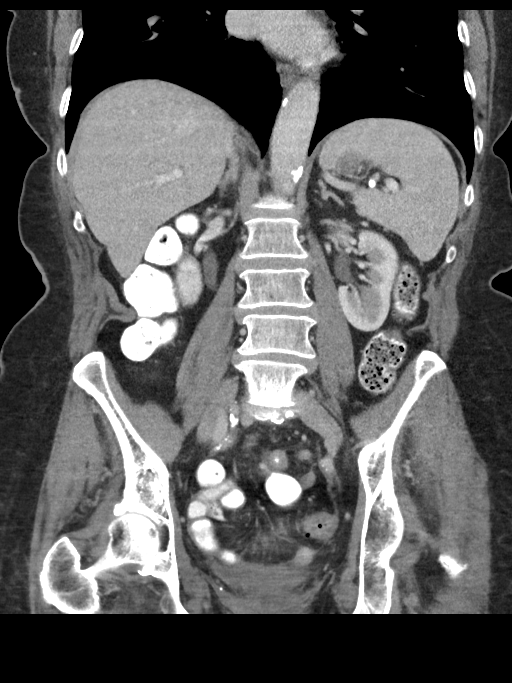

[15 of 46 positions shown; findings below may reference images not displayed]

FINDINGS: Lower chest: The lung bases are clear of an acute process. No
worrisome pulmonary lesions. Elongated nodular density in the right
middle lobe along the major fissure is likely a lymph node.

The heart is normal in size. Dense calcifications along the mitral
valve annulus are noted along with aortic valve calcifications and
aortic calcifications. The distal esophagus is grossly normal.

Hepatobiliary: No focal hepatic lesions or intrahepatic biliary
dilatation. The gallbladder is normal. No common bile duct
dilatation.

Pancreas: No mass, inflammation or ductal dilatation.

Spleen: Normal size. No focal lesions.

Adrenals/Urinary Tract: The adrenal glands are normal.

No renal, ureteral or bladder calculi. No collecting system
abnormalities on the delayed images. The bladder is unremarkable.

Stomach/Bowel: The stomach, duodenum, small bowel and colon are
unremarkable. No acute inflammatory changes, mass lesions or
obstructive findings. The terminal ileum is normal. The appendix is
surgically absent.

Vascular/Lymphatic: Advanced vascular calcifications involving the
aorta. There are renal artery ostial calcifications and scattered
iliac artery calcifications. No aneurysm or dissection. The major
venous structures are patent. No mesenteric or retroperitoneal mass
or adenopathy.

Reproductive: Surgically absent.

Other: No pelvic mass or adenopathy. No free pelvic fluid
collections. No inguinal mass or adenopathy. No abdominal wall
hernia or subcutaneous lesions.

Musculoskeletal: No significant bony findings. Advanced lower lumbar
facet disease. Advanced degenerative disc disease at L5-S1 with a
moderate-sized calcified disc protrusion.
IMPRESSION: 1. No acute abdominal/pelvic findings, mass lesions or
lymphadenopathy.
2. Advanced vascular calcifications involving the aorta and branch
vessels.

Aortic Atherosclerosis ([id number]).

## 2021-07-28 DIAGNOSIS — L821 Other seborrheic keratosis: Secondary | ICD-10-CM | POA: Diagnosis not present

## 2021-07-28 DIAGNOSIS — B070 Plantar wart: Secondary | ICD-10-CM | POA: Diagnosis not present

## 2021-07-28 DIAGNOSIS — B078 Other viral warts: Secondary | ICD-10-CM | POA: Diagnosis not present

## 2021-09-07 DIAGNOSIS — R42 Dizziness and giddiness: Secondary | ICD-10-CM | POA: Diagnosis not present

## 2021-09-22 DIAGNOSIS — Z1231 Encounter for screening mammogram for malignant neoplasm of breast: Secondary | ICD-10-CM | POA: Diagnosis not present

## 2021-09-22 DIAGNOSIS — Z6829 Body mass index (BMI) 29.0-29.9, adult: Secondary | ICD-10-CM | POA: Diagnosis not present

## 2021-09-22 DIAGNOSIS — Z01419 Encounter for gynecological examination (general) (routine) without abnormal findings: Secondary | ICD-10-CM | POA: Diagnosis not present

## 2021-10-06 DIAGNOSIS — H8110 Benign paroxysmal vertigo, unspecified ear: Secondary | ICD-10-CM | POA: Diagnosis not present

## 2021-10-12 DIAGNOSIS — R42 Dizziness and giddiness: Secondary | ICD-10-CM | POA: Diagnosis not present

## 2021-11-06 DIAGNOSIS — Z7982 Long term (current) use of aspirin: Secondary | ICD-10-CM | POA: Diagnosis not present

## 2021-11-06 DIAGNOSIS — I10 Essential (primary) hypertension: Secondary | ICD-10-CM | POA: Diagnosis not present

## 2021-11-11 DIAGNOSIS — I10 Essential (primary) hypertension: Secondary | ICD-10-CM | POA: Diagnosis not present

## 2021-11-11 DIAGNOSIS — R002 Palpitations: Secondary | ICD-10-CM | POA: Diagnosis not present

## 2021-11-11 DIAGNOSIS — M8580 Other specified disorders of bone density and structure, unspecified site: Secondary | ICD-10-CM | POA: Diagnosis not present

## 2021-11-11 DIAGNOSIS — I700 Atherosclerosis of aorta: Secondary | ICD-10-CM | POA: Diagnosis not present

## 2021-11-11 DIAGNOSIS — N3000 Acute cystitis without hematuria: Secondary | ICD-10-CM | POA: Diagnosis not present

## 2021-11-11 DIAGNOSIS — D509 Iron deficiency anemia, unspecified: Secondary | ICD-10-CM | POA: Diagnosis not present

## 2021-11-11 DIAGNOSIS — R490 Dysphonia: Secondary | ICD-10-CM | POA: Diagnosis not present

## 2021-11-11 DIAGNOSIS — Z0000 Encounter for general adult medical examination without abnormal findings: Secondary | ICD-10-CM | POA: Diagnosis not present

## 2021-11-13 ENCOUNTER — Telehealth: Payer: Self-pay | Admitting: Hematology and Oncology

## 2021-11-13 NOTE — Telephone Encounter (Signed)
Scheduled appt per 1/13 referral. Pt was already an established pt with Dr. [name] Adie. Pt is aware of appt date and time.

## 2021-11-23 DIAGNOSIS — D509 Iron deficiency anemia, unspecified: Secondary | ICD-10-CM | POA: Diagnosis not present

## 2021-11-25 DIAGNOSIS — M810 Age-related osteoporosis without current pathological fracture: Secondary | ICD-10-CM | POA: Diagnosis not present

## 2021-11-25 NOTE — Progress Notes (Signed)
Patient Care Team: Deland Pretty, MD as PCP - General (Internal Medicine)  DIAGNOSIS:    ICD-10-CM   1. Iron deficiency anemia, unspecified iron deficiency anemia type  D50.9       CHIEF COMPLIANT: Follow-up of iron deficiency anemia  INTERVAL HISTORY: [name] is a 81 y.o. with above-mentioned history of iron deficiency anemia treated with IV iron. She presents to the clinic today for follow-up.  Over the past 6 months she has gotten progressively more fatigue and shortness of breath with exertion.  Recently she started having pica to ice chips.  Previously that was her concern that she is iron deficient.  Blood work done by her Dr.Pharr showed iron deficiency and she was referred to [place] for discussion regarding intravenous iron therapy. Her only other complaint today is frequent urinary infections.  She is discussing that with Dr. Shelia Media  ALLERGIES:  is allergic to crestor [rosuvastatin] and codeine.  MEDICATIONS:  Current Outpatient Medications  Medication Sig Dispense Refill   b complex vitamins tablet Take 1 tablet by mouth daily.       Bempedoic Acid-Ezetimibe (NEXLIZET) 180-10 MG TABS Take by mouth.     calcium carbonate (OS-CAL - DOSED IN MG OF ELEMENTAL CALCIUM) 1250 MG tablet Take 1 tablet by mouth daily.       Cholecalciferol (VITAMIN D) 2000 UNITS tablet Take 2,000 Units by mouth daily.       denosumab (PROLIA) 60 MG/ML SOSY injection Inject into the skin See admin instructions.     diphenoxylate-atropine (LOMOTIL) 2.5-0.025 MG tablet Take 1-2 tablets by mouth 4 (four) times daily as needed for diarrhea or loose stools. 20 tablet 0   Garlic 2440 MG CAPS Take 1 capsule by mouth daily.     Krill Oil Omega-3 300 MG CAPS Take 1 capsule by mouth daily.     loperamide (IMODIUM) 2 MG capsule Take 1 capsule (2 mg total) by mouth 4 (four) times daily as needed for diarrhea or loose stools. 12 capsule 0   meclizine (ANTIVERT) 25 MG tablet Take 25 mg by mouth as needed for  dizziness.     metoprolol succinate (TOPROL-XL) 25 MG 24 hr tablet Take 25 mg by mouth 2 (two) times daily.       Multiple Vitamins-Minerals (MULTIVITAMINS THER. W/MINERALS) TABS Take 1 tablet by mouth daily.       pantoprazole (PROTONIX) 40 MG tablet Take 40 mg by mouth daily.     triamterene-hydrochlorothiazide (MAXZIDE-25) 37.5-25 MG tablet Take 1 tablet by mouth daily.     Zinc 100 MG TABS Take by mouth.     No current facility-administered medications for this visit.    PHYSICAL EXAMINATION: ECOG PERFORMANCE STATUS: 1 - Symptomatic but completely ambulatory  Vitals:   11/26/21 0803  BP: (!) 110/53  Pulse: 61  Resp: 18  Temp: (!) 97.3 F (36.3 C)  SpO2: 99%   Filed Weights   11/26/21 0803  Weight: 161 lb 14.4 oz (73.4 kg)     LABORATORY DATA:  I have reviewed the data as listed CMP Latest Ref Rng & Units 04/29/2021 01/13/2021 01/10/2021  Glucose 65 - 99 mg/dL 90 107(H) 141(H)  BUN 8 - 27 mg/dL 23 11 14   Creatinine 0.57 - 1.00 mg/dL 1.13(H) 0.88 0.88  Sodium 134 - 144 mmol/L 137 136 139  Potassium 3.5 - 5.2 mmol/L 4.9 3.0(L) 3.4(L)  Chloride 96 - 106 mmol/L 103 104 104  CO2 20 - 29 mmol/L 23 21(L) 24  Calcium  8.7 - 10.3 mg/dL 9.3 7.2(L) 8.7(L)  Total Protein 6.0 - 8.5 g/dL 6.4 - 7.0  Total Bilirubin 0.0 - 1.2 mg/dL 0.5 - 1.0  Alkaline Phos 44 - 121 IU/L 39(L) - 36(L)  AST 0 - 40 IU/L 30 - 30  ALT 0 - 32 IU/L 19 - 23    Lab Results  Component Value Date   WBC 5.6 01/13/2021   HGB 11.1 (L) 01/13/2021   HCT 34.0 (L) 01/13/2021   MCV 79.8 (L) 01/13/2021   PLT 197 01/13/2021   NEUTROABS 3.8 01/13/2021    ASSESSMENT & PLAN:  Iron deficiency anemia Diagnosed in 2018 and underwent upper endoscopy and colonoscopy by Dr. 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   Lab review 05/14/2019: Ferritin 13, iron saturation 19%,  hemoglobin 12.3 11/16/2019: Ferritin 27, iron saturation 16%, hemoglobin 12.6 11/06/2021: Hemoglobin 10.5, MCV 77.6, TIBC 442, iron saturation 4%  Patient has symptoms of pica for ice chips. Recommend intravenous iron therapy I suspect malabsorption is the most likely cause of her iron deficiency.    Recheck lab work in 4 months and follow-up with a telephone visit    No orders of the defined types were placed in this encounter.  The patient has a good understanding of the overall plan. she agrees with it. she will call with any problems that may develop before the next visit here.  Total time spent: 20 mins including face to face time and time spent for planning, charting and coordination of care  Rulon Eisenmenger, MD, MPH 11/26/2021  I, Thana Ates, am acting as scribe for Dr. Nicholas Lose.  I have reviewed the above documentation for accuracy and completeness, and I agree with the above.

## 2021-11-25 NOTE — Assessment & Plan Note (Signed)
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  Lab review 05/14/2019: Ferritin 13, iron saturation 19%, hemoglobin 12.3 11/16/2019: Ferritin 27, iron saturation 16%, hemoglobin 12.6 11/06/2021: Hemoglobin 10.5, MCV 77.6, TIBC 442, iron saturation 4%  Patient has symptoms of pica for ice chips. Recommend intravenous iron therapy  Recheck lab work in 3 months and follow-up with a telephone visit

## 2021-11-26 ENCOUNTER — Inpatient Hospital Stay: Payer: PPO | Attending: Hematology and Oncology | Admitting: Hematology and Oncology

## 2021-11-26 ENCOUNTER — Other Ambulatory Visit: Payer: Self-pay

## 2021-11-26 DIAGNOSIS — D509 Iron deficiency anemia, unspecified: Secondary | ICD-10-CM | POA: Insufficient documentation

## 2021-11-26 DIAGNOSIS — Z79899 Other long term (current) drug therapy: Secondary | ICD-10-CM | POA: Diagnosis not present

## 2021-12-01 DIAGNOSIS — R002 Palpitations: Secondary | ICD-10-CM | POA: Diagnosis not present

## 2021-12-03 ENCOUNTER — Other Ambulatory Visit: Payer: Self-pay

## 2021-12-03 ENCOUNTER — Inpatient Hospital Stay: Payer: PPO | Attending: Hematology and Oncology

## 2021-12-03 VITALS — BP 112/57 | HR 60 | Temp 98.2°F | Resp 16

## 2021-12-03 DIAGNOSIS — D509 Iron deficiency anemia, unspecified: Secondary | ICD-10-CM | POA: Insufficient documentation

## 2021-12-03 MED ORDER — SODIUM CHLORIDE 0.9 % IV SOLN
300.0000 mg | Freq: Once | INTRAVENOUS | Status: AC
  Administered 2021-12-03: 300 mg via INTRAVENOUS
  Filled 2021-12-03: qty 300

## 2021-12-03 MED ORDER — SODIUM CHLORIDE 0.9 % IV SOLN: Freq: Once | INTRAVENOUS | Status: AC

## 2021-12-03 NOTE — Patient Instructions (Signed)

## 2021-12-10 ENCOUNTER — Other Ambulatory Visit: Payer: Self-pay

## 2021-12-10 ENCOUNTER — Inpatient Hospital Stay: Payer: PPO

## 2021-12-10 VITALS — BP 127/61 | HR 61 | Temp 98.2°F | Resp 16

## 2021-12-10 DIAGNOSIS — D509 Iron deficiency anemia, unspecified: Secondary | ICD-10-CM | POA: Diagnosis not present

## 2021-12-10 MED ORDER — SODIUM CHLORIDE 0.9 % IV SOLN: Freq: Once | INTRAVENOUS | Status: AC

## 2021-12-10 MED ORDER — SODIUM CHLORIDE 0.9 % IV SOLN
300.0000 mg | Freq: Once | INTRAVENOUS | Status: AC
  Administered 2021-12-10: 300 mg via INTRAVENOUS
  Filled 2021-12-10: qty 300

## 2021-12-10 NOTE — Patient Instructions (Signed)

## 2021-12-10 NOTE — Progress Notes (Signed)
Patient complained of slight nausea just prior to iron infusion completion. Patient provided with saltines and ginger ale. Nausea resolved quickly. Patient discharged from infusion VSS following 30 minute observation.

## 2021-12-17 ENCOUNTER — Inpatient Hospital Stay: Payer: PPO

## 2021-12-17 ENCOUNTER — Other Ambulatory Visit: Payer: Self-pay

## 2021-12-17 VITALS — BP 146/61 | HR 63 | Resp 17 | Wt 162.0 lb

## 2021-12-17 DIAGNOSIS — D509 Iron deficiency anemia, unspecified: Secondary | ICD-10-CM | POA: Diagnosis not present

## 2021-12-17 MED ORDER — SODIUM CHLORIDE 0.9 % IV SOLN: Freq: Once | INTRAVENOUS | Status: AC

## 2021-12-17 MED ORDER — SODIUM CHLORIDE 0.9 % IV SOLN
300.0000 mg | Freq: Once | INTRAVENOUS | Status: AC
  Administered 2021-12-17: 300 mg via INTRAVENOUS
  Filled 2021-12-17: qty 300

## 2021-12-23 DIAGNOSIS — H04222 Epiphora due to insufficient drainage, left lacrimal gland: Secondary | ICD-10-CM | POA: Diagnosis not present

## 2021-12-24 DIAGNOSIS — R002 Palpitations: Secondary | ICD-10-CM | POA: Diagnosis not present

## 2022-01-13 NOTE — Progress Notes (Signed)
? ?Primary Physician/Referring:  Deland Pretty, MD ? ?Patient ID: [name], female    DOB: Jan 04, 1941, 81 y.o.   MRN: [id number] ? ?Chief Complaint  ?Patient presents with  ? SVT  ? Follow-up  ? ?HPI:   ? ?[name]  is a 81 y.o. 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 ? ?Patient is now referred back to me for new onset of rapid palpitations that started in December 2022.    ? ?Past Medical History:  ?Diagnosis Date  ? Anemia   ? Arthritis   ? Cataract   ? Diverticulosis of colon   ? Endometrial cancer ([place])   ? 1A grade endometrial ca  ? Essential hypertension 01/24/2009  ? Qualifier: Diagnosis of  By: Nelson-Smith CMA (AAMA), Dottie    ? Hx of radiation therapy 04/24/08-05/29/08& 8/12/,8/19,&06/27/08  ? external beam  through 7/09 ,then intracavity in 06/2008  ? Hypertension   ? 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  ? ?Social History  ? ?Tobacco Use  ? Smoking status: Never  ? Smokeless tobacco: Never  ?Substance Use Topics  ? Alcohol use: Yes  ?  Alcohol/week: 0.0 standard drinks  ?  Comment: less then 2 a month  ? ?ROS  ?Review of Systems  ?Cardiovascular:  Positive for dyspnea on exertion and palpitations. Negative for chest pain and leg swelling.  ?Objective  ?Blood pressure 119/64, pulse 61, temperature 98.7 ?F (37.1 ?C), temperature source Temporal, resp. rate 16, height 5' 7"  (1.702 m), weight 162 lb 12.8 oz (73.8 kg), SpO2 97 %.  ?Vitals with BMI 01/14/2022 12/17/2021 12/10/2021  ?Height 5' 7"  - -  ?Weight 162 lbs 13 oz 162 lbs -  ?BMI 25.49 25.37 -  ?Systolic 409 811  914  ?Diastolic 64 61 61  ?Pulse 61 63 61  ?  ? Physical Exam ?Constitutional:   ?   Comments: She is moderately built and well nurished  ?Neck:  ?   Vascular: No carotid bruit or JVD.  ?Cardiovascular:  ?   Rate and Rhythm: Normal rate and regular rhythm.  ?   Pulses: Intact distal pulses.  ?   Heart sounds: S1 normal and S2 normal. Murmur heard.  ?Mid to late systolic murmur is present with a grade of 3/6 at the lower left sternal border and apex.  ?  No gallop.  ?Pulmonary:  ?   Effort: Pulmonary effort is normal.  ?   Breath sounds: Normal breath sounds.  ?Abdominal:  ?   General: Bowel sounds are normal.  ?   Palpations: Abdomen is soft.  ? ?Laboratory examination:  ? ?Recent Labs  ?  04/29/21 ?1102  ?NA 137  ?K 4.9  ?CL 103  ?CO2 23  ?GLUCOSE 90  ?BUN 23  ?CREATININE 1.13*  ?CALCIUM 9.3  ? ?CrCl cannot be calculated (Patient's most recent lab result is older than the maximum 21 days allowed.).  ?CMP Latest Ref Rng & Units 04/29/2021 01/13/2021 01/10/2021  ?Glucose 65 - 99 mg/dL 90 107(H) 141(H)  ?BUN 8 - 27 mg/dL 23 11 14   ?Creatinine 0.57 - 1.00 mg/dL 1.13(H) 0.88 0.88  ?Sodium 134 -  144 mmol/L 137 136 139  ?Potassium 3.5 - 5.2 mmol/L 4.9 3.0(L) 3.4(L)  ?Chloride 96 - 106 mmol/L 103 104 104  ?CO2 20 - 29 mmol/L 23 21(L) 24  ?Calcium 8.7 - 10.3 mg/dL 9.3 7.2(L) 8.7(L)  ?Total Protein 6.0 - 8.5 g/dL 6.4 - 7.0  ?Total Bilirubin 0.0 - 1.2 mg/dL 0.5 - 1.0  ?Alkaline Phos 44 - 121 IU/L 39(L) - 36(L)  ?AST 0 - 40 IU/L 30 - 30  ?ALT 0 - 32 IU/L 19 - 23  ? ?CBC Latest Ref Rng & Units 01/13/2021 01/10/2021 01/15/2020  ?WBC 4.0 - 10.5 K/uL 5.6 10.1 8.3  ?Hemoglobin 12.0 - 15.0 g/dL 11.1(L) 12.0 13.3  ?Hematocrit 36.0 - 46.0 % 34.0(L) 37.3 39.3  ?Platelets 150 - 400 K/uL 197 253 222.0  ? ?External labs: ? ?Labs 11/23/2021: ? ?Stool Hemoccult negative. ? ?Total cholesterol 97, triglycerides 99, HDL 33, LDL 44. ? ?Hb 10.5/HCT 33.3, platelets 248. ? ?BUN 26, creatinine 1.28, EGFR 40 mL, potassium 5.1.  LFTs normal. ? ?Urinary  protein negative. ?Medications and allergies  ? ?Allergies  ?Allergen Reactions  ? Crestor [Rosuvastatin] Other (See Comments)  ?  Leg cramps  ? Codeine Other (See Comments)  ?  Hyper  ?  ? ?Current Outpatient Medications:  ?  b complex vitamins tablet, Take 1 tablet by mouth daily.  , Disp: , Rfl:  ?  Bempedoic Acid-Ezetimibe (NEXLIZET) 180-10 MG TABS, Take by mouth., Disp: , Rfl:  ?  calcium carbonate (OS-CAL - DOSED IN MG OF ELEMENTAL CALCIUM) 1250 MG tablet, Take 1 tablet by mouth daily.  , Disp: , Rfl:  ?  Cholecalciferol (VITAMIN D) 2000 UNITS tablet, Take 2,000 Units by mouth daily.  , Disp: , Rfl:  ?  denosumab (PROLIA) 60 MG/ML SOSY injection, Inject into the skin See admin instructions., Disp: , Rfl:  ?  diphenoxylate-atropine (LOMOTIL) 2.5-0.025 MG tablet, Take 1-2 tablets by mouth 4 (four) times daily as needed for diarrhea or loose stools., Disp: 20 tablet, Rfl: 0 ?  Garlic 0623 MG CAPS, Take 1 capsule by mouth daily., Disp: , Rfl:  ?  Krill Oil Omega-3 300 MG CAPS, Take 1 capsule by mouth daily., Disp: , Rfl:  ?  loperamide (IMODIUM) 2 MG capsule, Take 1 capsule (2 mg total) by mouth 4 (four) times daily as needed for diarrhea or loose stools., Disp: 12 capsule, Rfl: 0 ?  meclizine (ANTIVERT) 25 MG tablet, Take 25 mg by mouth as needed for dizziness., Disp: , Rfl:  ?  metoprolol succinate (TOPROL-XL) 25 MG 24 hr tablet, Take 25 mg by mouth 2 (two) times daily.  Take additional tablet for palpitations, Disp: , Rfl:  ?  Multiple Vitamins-Minerals (MULTIVITAMINS THER. W/MINERALS) TABS, Take 1 tablet by mouth daily.  , Disp: , Rfl:  ?  pantoprazole (PROTONIX) 40 MG tablet, Take 40 mg by mouth daily., Disp: , Rfl:  ?  Zinc 100 MG TABS, Take by mouth., Disp: , Rfl:   ? ?Radiology:  ?No results found. ? ?Cardiac Studies:  ? ?Chest x-ray 12/30/11/2016: ?Negative two view chest x-ray ? ?CT colonoscopy 01/04/2018: ?Mild colonic diverticulosis. No fixed polypoid filling defects or annular constricting lesions. ?No  acute extra colonic abnormality in the abdomen or pelvis. ?Aortic atherosclerosis. ? ?Lexiscan Tetrofosmin Stress Test  11/12/2019: ?Nondiagnostic ECG stress. ?Mild soft tissue attenuation noted in the inferior wall. No ischemia or scar. Stress LV EF: 70%.  ?No previous exam available for comparison. Low risk study. ? ?Echocardiogram 11/23/2019:  ?  Left ventricle cavity is normal in size and wall thickness. Normal global wall motion. Normal LV systolic function with EF 56%. Normal diastolic filling pattern. Calculated EF 56%.  ?Left atrial cavity is moderately dilated. LA vol index 48 cc/m2.  ?Trileaflet aortic valve.  Mild (Grade I) aortic regurgitation.  ?Moderate (Grade III) mitral regurgitation.  ?Moderate tricuspid regurgitation. Mild pulmonary hypertension. Estimated pulmonary artery systolic pressure is 38 mmHg.  ?No significant change compared to previous study on 11/11/2017. ? ?Extended EKG monitoring 14 days starting 11/12/2021: ?Predominant rhythm is normal sinus rhythm.  Minimum heart rate 54, maximum heart rate 210 bpm.  There are frequent atrial tachycardia episodes, longest lasting 12 seconds and fastest 14 beats at 210 bpm.  1 episode of 3 beat NSVT, EKG = PACs. ?There are occasional PVCs and ventricular bigeminy.  There were no patient triggered events. ? ?EKG ?EKG 01/14/2022: Normal sinus rhythm at rate of 55 bpm, normal axis, no evidence of ischemia, normal EKG.  No change from 04/29/2021. ? ?Assessment  ? ?  ICD-10-CM   ?1. Rapid palpitations  R00.2   ?  ?2. Moderate mitral regurgitation  I34.0 EKG 12-Lead  ?  PCV ECHOCARDIOGRAM COMPLETE  ?  ?3. Essential hypertension  I10   ?  ?4. Stage 3b chronic kidney disease ([place])  N18.32   ?  ?  ?Recommendations:  ?No orders of the defined types were placed in this encounter. ?  ?[name]  is a 81 y.o. 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 She has chronic diarrhea

## 2022-01-14 ENCOUNTER — Encounter: Payer: Self-pay | Admitting: Cardiology

## 2022-01-14 ENCOUNTER — Other Ambulatory Visit: Payer: Self-pay

## 2022-01-14 ENCOUNTER — Ambulatory Visit: Payer: PPO | Admitting: Cardiology

## 2022-01-14 VITALS — BP 119/64 | HR 61 | Temp 98.7°F | Resp 16 | Ht 67.0 in | Wt 162.8 lb

## 2022-01-14 DIAGNOSIS — N1832 Chronic kidney disease, stage 3b: Secondary | ICD-10-CM | POA: Diagnosis not present

## 2022-01-14 DIAGNOSIS — I340 Nonrheumatic mitral (valve) insufficiency: Secondary | ICD-10-CM

## 2022-01-14 DIAGNOSIS — E7800 Pure hypercholesterolemia, unspecified: Secondary | ICD-10-CM

## 2022-01-14 DIAGNOSIS — R0609 Other forms of dyspnea: Secondary | ICD-10-CM

## 2022-01-14 DIAGNOSIS — R002 Palpitations: Secondary | ICD-10-CM | POA: Diagnosis not present

## 2022-01-14 DIAGNOSIS — I129 Hypertensive chronic kidney disease with stage 1 through stage 4 chronic kidney disease, or unspecified chronic kidney disease: Secondary | ICD-10-CM | POA: Diagnosis not present

## 2022-01-14 DIAGNOSIS — I10 Essential (primary) hypertension: Secondary | ICD-10-CM

## 2022-02-05 DIAGNOSIS — K219 Gastro-esophageal reflux disease without esophagitis: Secondary | ICD-10-CM | POA: Diagnosis not present

## 2022-02-22 DIAGNOSIS — M810 Age-related osteoporosis without current pathological fracture: Secondary | ICD-10-CM | POA: Diagnosis not present

## 2022-02-22 DIAGNOSIS — M8589 Other specified disorders of bone density and structure, multiple sites: Secondary | ICD-10-CM | POA: Diagnosis not present

## 2022-03-19 NOTE — Progress Notes (Signed)
HEMATOLOGY-ONCOLOGY TELEPHONE VISIT PROGRESS NOTE  I connected with [name] on 03/26/22 at  9:00 AM EDT by telephone and verified that I am speaking with the correct person using two identifiers.  I discussed the limitations, risks, security and privacy concerns of performing an evaluation and management service by telephone and the availability of in person appointments.  I also discussed with the patient that there may be a patient responsible charge related to this service. The patient expressed understanding and agreed to proceed.   History of Present Illness: [name] is a 81 y.o. with above-mentioned history of iron deficiency anemia treated with IV iron. She presents to the clinic today via telephone follow-up.  She feels great without any signs or symptoms of iron deficiency.  She tolerated the iron infusions extremely well.  Does not have the ice chip cravings anymore.  REVIEW OF SYSTEMS:   Constitutional: Denies fevers, chills or abnormal weight loss  All other systems were reviewed with the patient and are negative. Observations/Objective:     Assessment Plan:  Iron deficiency anemia Diagnosed in 2018 and underwent upper endoscopy and colonoscopy by Dr. 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   Lab review 05/14/2019: Ferritin 13, iron saturation 19%, hemoglobin 12.3 11/16/2019: Ferritin 27, iron saturation 16%, hemoglobin 12.6 11/06/2021: Hemoglobin 10.5, MCV 77.6, TIBC 442, iron saturation 4% 03/25/2022: Hemoglobin 12.8, MCV 90, TIBC 466, iron saturation 20%, ferritin 16   Recommended watchful monitoring  I suspect malabsorption is the most likely cause of her iron deficiency.   Patient has appointment in January with her primary care physician Dr. Shelia Media.  If there was evidence of iron deficiency I can be reached at  that time.  If she gets cravings for ice chips she needs to see me urgently.  I will plan to recheck lab work in 1 year and follow-up with a telephone visit    I discussed the assessment and treatment plan with the patient. The patient was provided an opportunity to ask questions and all were answered. The patient agreed with the plan and demonstrated an understanding of the instructions. The patient was advised to call back or seek an in-person evaluation if the symptoms worsen or if the condition fails to improve as anticipated.   I provided 11 minutes of non-face-to-face time during this encounter. Harriette Ohara, MD   I Gardiner Coins am scribing for Dr. [name] Adie  I have reviewed the above documentation for accuracy and completeness, and I agree with the above.

## 2022-03-25 ENCOUNTER — Inpatient Hospital Stay: Payer: PPO | Attending: Internal Medicine

## 2022-03-25 DIAGNOSIS — D509 Iron deficiency anemia, unspecified: Secondary | ICD-10-CM | POA: Diagnosis not present

## 2022-03-25 LAB — CBC WITH DIFFERENTIAL (CANCER CENTER ONLY)
Abs Immature Granulocytes: 0.03 10*3/uL (ref 0.00–0.07)
Basophils Absolute: 0.1 10*3/uL (ref 0.0–0.1)
Basophils Relative: 1 %
Eosinophils Absolute: 0.2 10*3/uL (ref 0.0–0.5)
Eosinophils Relative: 2 %
HCT: 37.8 % (ref 36.0–46.0)
Hemoglobin: 12.8 g/dL (ref 12.0–15.0)
Immature Granulocytes: 0 %
Lymphocytes Relative: 21 %
Lymphs Abs: 1.6 10*3/uL (ref 0.7–4.0)
MCH: 30.5 pg (ref 26.0–34.0)
MCHC: 33.9 g/dL (ref 30.0–36.0)
MCV: 90 fL (ref 80.0–100.0)
Monocytes Absolute: 1 10*3/uL (ref 0.1–1.0)
Monocytes Relative: 13 %
Neutro Abs: 5 10*3/uL (ref 1.7–7.7)
Neutrophils Relative %: 63 %
Platelet Count: 195 10*3/uL (ref 150–400)
RBC: 4.2 MIL/uL (ref 3.87–5.11)
RDW: 14.2 % (ref 11.5–15.5)
WBC Count: 7.8 10*3/uL (ref 4.0–10.5)
nRBC: 0 % (ref 0.0–0.2)

## 2022-03-25 LAB — IRON AND IRON BINDING CAPACITY (CC-WL,HP ONLY)
Iron: 91 ug/dL (ref 28–170)
Saturation Ratios: 20 % (ref 10.4–31.8)
TIBC: 466 ug/dL — ABNORMAL HIGH (ref 250–450)
UIBC: 375 ug/dL (ref 148–442)

## 2022-03-25 LAB — FERRITIN: Ferritin: 16 ng/mL (ref 11–307)

## 2022-03-26 ENCOUNTER — Inpatient Hospital Stay: Payer: PPO | Admitting: Hematology and Oncology

## 2022-03-26 DIAGNOSIS — D509 Iron deficiency anemia, unspecified: Secondary | ICD-10-CM

## 2022-03-26 NOTE — Assessment & Plan Note (Signed)
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  Lab review 05/14/2019: Ferritin 13, iron saturation 19%, hemoglobin 12.3 11/16/2019: Ferritin 27, iron saturation 16%, hemoglobin 12.6 11/06/2021: Hemoglobin 10.5, MCV 77.6, TIBC 442, iron saturation 4% 03/25/2022: Hemoglobin 12.8, MCV 90, TIBC 466, iron saturation 20%, ferritin 16  Recommended watchful monitoring  I suspect malabsorption is the most likely cause of her iron deficiency.    Recheck lab work in 4 months and follow-up with a telephone visit

## 2022-03-30 ENCOUNTER — Telehealth: Payer: Self-pay | Admitting: Hematology and Oncology

## 2022-03-30 NOTE — Telephone Encounter (Signed)
Scheduled appointment per 5/26 los. Patient is aware.

## 2022-03-31 DIAGNOSIS — D509 Iron deficiency anemia, unspecified: Secondary | ICD-10-CM | POA: Diagnosis not present

## 2022-03-31 DIAGNOSIS — M810 Age-related osteoporosis without current pathological fracture: Secondary | ICD-10-CM | POA: Diagnosis not present

## 2022-03-31 DIAGNOSIS — I10 Essential (primary) hypertension: Secondary | ICD-10-CM | POA: Diagnosis not present

## 2022-03-31 DIAGNOSIS — M8580 Other specified disorders of bone density and structure, unspecified site: Secondary | ICD-10-CM | POA: Diagnosis not present

## 2022-04-05 DIAGNOSIS — M5412 Radiculopathy, cervical region: Secondary | ICD-10-CM | POA: Diagnosis not present

## 2022-04-05 DIAGNOSIS — H52203 Unspecified astigmatism, bilateral: Secondary | ICD-10-CM | POA: Diagnosis not present

## 2022-04-05 DIAGNOSIS — Z961 Presence of intraocular lens: Secondary | ICD-10-CM | POA: Diagnosis not present

## 2022-04-05 DIAGNOSIS — M546 Pain in thoracic spine: Secondary | ICD-10-CM | POA: Diagnosis not present

## 2022-04-05 DIAGNOSIS — H524 Presbyopia: Secondary | ICD-10-CM | POA: Diagnosis not present

## 2022-04-05 DIAGNOSIS — R278 Other lack of coordination: Secondary | ICD-10-CM | POA: Diagnosis not present

## 2022-04-05 DIAGNOSIS — H04222 Epiphora due to insufficient drainage, left lacrimal gland: Secondary | ICD-10-CM | POA: Diagnosis not present

## 2022-04-08 DIAGNOSIS — R278 Other lack of coordination: Secondary | ICD-10-CM | POA: Diagnosis not present

## 2022-04-08 DIAGNOSIS — M546 Pain in thoracic spine: Secondary | ICD-10-CM | POA: Diagnosis not present

## 2022-04-08 DIAGNOSIS — M5412 Radiculopathy, cervical region: Secondary | ICD-10-CM | POA: Diagnosis not present

## 2022-04-14 DIAGNOSIS — M546 Pain in thoracic spine: Secondary | ICD-10-CM | POA: Diagnosis not present

## 2022-04-14 DIAGNOSIS — M5412 Radiculopathy, cervical region: Secondary | ICD-10-CM | POA: Diagnosis not present

## 2022-04-14 DIAGNOSIS — R278 Other lack of coordination: Secondary | ICD-10-CM | POA: Diagnosis not present

## 2022-04-15 ENCOUNTER — Other Ambulatory Visit: Payer: PPO

## 2022-04-21 ENCOUNTER — Ambulatory Visit: Payer: PPO | Admitting: Cardiology

## 2022-04-26 ENCOUNTER — Ambulatory Visit: Payer: PPO

## 2022-04-26 DIAGNOSIS — I340 Nonrheumatic mitral (valve) insufficiency: Secondary | ICD-10-CM | POA: Diagnosis not present

## 2022-05-10 ENCOUNTER — Encounter: Payer: Self-pay | Admitting: Cardiology

## 2022-05-10 ENCOUNTER — Ambulatory Visit: Payer: PPO | Admitting: Cardiology

## 2022-05-10 VITALS — BP 112/63 | HR 62 | Temp 98.2°F | Resp 17 | Ht 67.0 in | Wt 157.2 lb

## 2022-05-10 DIAGNOSIS — N1832 Chronic kidney disease, stage 3b: Secondary | ICD-10-CM | POA: Diagnosis not present

## 2022-05-10 DIAGNOSIS — R0609 Other forms of dyspnea: Secondary | ICD-10-CM

## 2022-05-10 DIAGNOSIS — I341 Nonrheumatic mitral (valve) prolapse: Secondary | ICD-10-CM | POA: Diagnosis not present

## 2022-05-10 DIAGNOSIS — I071 Rheumatic tricuspid insufficiency: Secondary | ICD-10-CM

## 2022-05-10 DIAGNOSIS — I129 Hypertensive chronic kidney disease with stage 1 through stage 4 chronic kidney disease, or unspecified chronic kidney disease: Secondary | ICD-10-CM | POA: Diagnosis not present

## 2022-05-10 DIAGNOSIS — I10 Essential (primary) hypertension: Secondary | ICD-10-CM

## 2022-05-10 NOTE — Progress Notes (Signed)
Primary Physician/Referring:  Deland Pretty, MD  Patient ID: [name], female    DOB: September 14, 1941, 81 y.o.   MRN: [id number]  Chief Complaint  Patient presents with   Follow-up    81 years old   Mitral Regurgitation   Tricuspid Regurgitation   HPI:    CHANIYAH JAHR  is a 81 y.o. 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  I had seen her 3 months ago for new onset of rapid palpitations that started in December 2022.  Zio patch had revealed episodes of atrial tachycardia but no atrial fibrillation.   Past Medical History:  Diagnosis Date   Anemia    Arthritis    Cataract    Diverticulosis of colon    Endometrial cancer ([place])    1A grade endometrial ca   Essential hypertension 01/24/2009   Qualifier: Diagnosis of  By: Nelson-Smith CMA (AAMA), Dottie     Hx of radiation therapy 04/24/08-05/29/08& 8/12/,8/19,&06/27/08   external beam  through 7/09 ,then intracavity in 06/2008   Hypertension    Moderate mitral regurgitation 79/12/2018   Social History   Tobacco Use   Smoking status: Never   Smokeless tobacco: Never  Substance Use Topics   Alcohol use: Yes    Alcohol/week: 0.0 standard drinks of alcohol    Comment: less then 2 a month   ROS  Review of Systems  Cardiovascular:  Positive for dyspnea on exertion and palpitations. Negative for chest pain and leg swelling.   Objective  Blood pressure 112/63, pulse 62, temperature 98.2 F (36.8 C), temperature source Temporal, resp. rate 17, height _0  (1.702 m), weight 157 lb 3.2 oz (71.3 kg), SpO2 98 %.     05/10/2022    9:50 AM 01/14/2022   10:28 AM 12/17/2021    8:01  AM  Vitals with BMI  Height _1  _2    Weight 157 lbs 3 oz 162 lbs 13 oz 162 lbs  BMI 24.62 79.15 05.69  Systolic 794 801 655  Diastolic 63 64 61  Pulse 62 61 63     Physical Exam Constitutional:      Comments: She is moderately built and well nurished  Neck:     Vascular: No carotid bruit or JVD.  Cardiovascular:     Rate and Rhythm: Normal rate and regular rhythm.     Pulses: Intact distal pulses.     Heart sounds: S1 normal and S2 normal. Murmur heard.     Mid to late systolic murmur is present with a grade of 3/6 at the lower left sternal border and apex.     No gallop.  Pulmonary:     Effort: Pulmonary effort is normal.     Breath sounds: Normal breath sounds.  Abdominal:     General: Bowel sounds are normal.     Palpations: Abdomen is soft.    Laboratory examination:   No results for input(s): "NA", "K", "CL", "CO2", "GLUCOSE", "BUN", "CREATININE", "CALCIUM", "GFRNONAA", "GFRAA" in the last 8760 hours.  CrCl cannot be calculated (Patient's most recent lab result is older than the maximum 21 days allowed.).     Latest Ref Rng & Units 04/29/2021   11:02  AM 01/13/2021    2:38 AM 01/10/2021    9:08 AM  CMP  Glucose 65 - 99 mg/dL 90  107  141   BUN 8 - 27 mg/dL _0 Creatinine 0.57 - 1.00 mg/dL 1.13  0.88  0.88   Sodium 134 - 144 mmol/L 137  136  139   Potassium 3.5 - 5.2 mmol/L 4.9  3.0  3.4   Chloride 96 - 106 mmol/L 103  104  104   CO2 20 - 29 mmol/L _1 Calcium 8.7 - 10.3 mg/dL 9.3  7.2  8.7   Total Protein 6.0 - 8.5 g/dL 6.4   7.0   Total Bilirubin 0.0 - 1.2 mg/dL 0.5   1.0   Alkaline Phos 44 - 121 IU/L 39   36   AST 0 - 40 IU/L 30   30   ALT 0 - 32 IU/L 19   23       Latest Ref Rng & Units 03/25/2022    7:55 AM 01/13/2021    2:38 AM 01/10/2021    9:08 AM  CBC  WBC 4.0 - 10.5 K/uL 7.8  5.6  10.1   Hemoglobin 12.0 - 15.0 g/dL 12.8  11.1  12.0   Hematocrit 36.0 - 46.0 % 37.8  34.0  37.3   Platelets 150 - 400 K/uL 195  197  253     External labs:  Labs 11/23/2021:  Stool Hemoccult negative.  Total cholesterol 97, triglycerides 99, HDL 33, LDL 44.  Hb 10.5/HCT 33.3, platelets 248.  BUN 26, creatinine 1.28, EGFR 40 mL, potassium 5.1.  LFTs normal.  Urinary protein negative. Medications and allergies   Allergies  Allergen Reactions   Crestor [Rosuvastatin] Other (See Comments)    Leg cramps   Glycopyrrolate     Other reaction(s): extreme dry mouth   Codeine Other (See Comments)    Hyper     Current Outpatient Medications:    b complex vitamins tablet, Take 1 tablet by mouth daily.  , Disp: , Rfl:    Bempedoic Acid-Ezetimibe (NEXLIZET) 180-10 MG TABS, Take by mouth., Disp: , Rfl:    calcium carbonate (OS-CAL - DOSED IN MG OF ELEMENTAL CALCIUM) 1250 MG tablet, Take 1 tablet by mouth daily.  , Disp: , Rfl:    Cholecalciferol (VITAMIN D) 2000 UNITS tablet, Take 2,000 Units by mouth daily.  , Disp: , Rfl:    denosumab (PROLIA) 60 MG/ML SOSY injection, Inject into the skin See admin instructions., Disp: , Rfl:    Garlic 1007 MG CAPS, Take 1 capsule by mouth daily., Disp: , Rfl:    Krill Oil Omega-3 300 MG CAPS, Take 1 capsule by mouth daily., Disp: , Rfl:    loperamide (IMODIUM) 2 MG capsule, Take 1 capsule (2 mg total) by mouth 4 (four) times daily as needed for diarrhea or loose stools., Disp: 12 capsule, Rfl: 0   meclizine (ANTIVERT) 25 MG tablet, Take 25 mg by mouth as needed for dizziness., Disp: , Rfl:    metoprolol succinate (TOPROL-XL) 25 MG 24 hr tablet, Take 25 mg by mouth 2 (two) times daily.  Take additional tablet for palpitations, Disp: , Rfl:    Misc Natural Products (GLUCOSAMINE CHOND MSM FORMULA) TABS, See admin instructions., Disp: , Rfl:    Multiple Vitamins-Minerals (MULTIVITAMINS THER. W/MINERALS) TABS, Take 1 tablet by mouth daily.  , Disp: , Rfl:    pantoprazole (PROTONIX) 40 MG tablet,  Take 40 mg by mouth daily., Disp: , Rfl:    triamterene-hydrochlorothiazide (MAXZIDE-25) 37.5-25 MG  tablet, Take 0.5-1 tablets by mouth every morning., Disp: , Rfl:    Zinc 100 MG TABS, Take by mouth., Disp: , Rfl:    Radiology:  No results found.  Cardiac Studies:   Chest x-ray 12/30/11/2016: Negative two view chest x-ray  CT colonoscopy 01/04/2018: Mild colonic diverticulosis. No fixed polypoid filling defects or annular constricting lesions. No acute extra colonic abnormality in the abdomen or pelvis. Aortic atherosclerosis.  Lexiscan Tetrofosmin Stress Test  11/12/2019: Nondiagnostic ECG stress. Mild soft tissue attenuation noted in the inferior wall. No ischemia or scar. Stress LV EF: 70%.  No previous exam available for comparison. Low risk study.  Echocardiogram 11/23/2019:  Left ventricle cavity is normal in size and wall thickness. Normal global wall motion. Normal LV systolic function with EF 56%. Normal diastolic filling pattern. Calculated EF 56%.  Left atrial cavity is moderately dilated. LA vol index 48 cc/m2.  Trileaflet aortic valve.  Mild (Grade I) aortic regurgitation.  Moderate (Grade III) mitral regurgitation.  Moderate tricuspid regurgitation. Mild pulmonary hypertension. Estimated pulmonary artery systolic pressure is 38 mmHg.  No significant change compared to previous study on 11/11/2017.  Extended EKG monitoring 14 days starting 11/12/2021: Predominant rhythm is normal sinus rhythm.  Minimum heart rate 54, maximum heart rate 210 bpm.  There are frequent atrial tachycardia episodes, longest lasting 12 seconds and fastest 14 beats at 210 bpm.  1 episode of 3 beat NSVT, EKG = PACs. There are occasional PVCs and ventricular bigeminy.  There were no patient triggered events.  PCV ECHOCARDIOGRAM COMPLETE 04/26/2022  Narrative Echocardiogram 04/26/2022: Left ventricle cavity is normal in size and wall thickness. Normal global wall motion. Normal LV systolic function with EF 58%. Normal diastolic filling pattern. Left atrial cavity is moderately  dilated. Structurally normal trileaflet aortic valve.  Mild (Grade I) aortic regurgitation. Mild prolapse of the mitral valve leaflets. Moderate to severe mitral regurgitation. Moderate tricuspid regurgitation. Estimated pulmonary artery systolic pressure 49 mmHg. Mild pulmonic regurgitation. Previous study on 05/28/2021 reported mild LA dilatation, estimated PASP 38 mmHg.   EKG  EKG 05/10/2022: Normal sinus rhythm at rate of 60 bpm, normal axis, nonspecific T wave flattening inferior leads.  Compared to 01/14/2022, nonspecific T wave flattening in the inferior leads new, otherwise no significant change.  Assessment     ICD-10-CM   1. Mitral valve prolapse  I34.1 PCV ECHOCARDIOGRAM COMPLETE    2. Moderate tricuspid regurgitation  I07.1 PCV ECHOCARDIOGRAM COMPLETE    3. Essential hypertension  I10 EKG 12-Lead    4. Dyspnea on exertion  R06.09     5. Stage 3b chronic kidney disease ([place])  N18.32       Recommendations:  No orders of the defined types were placed in this encounter.   DESERI LOSS  is a 81 y.o. 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  I had seen her 3 months ago for new onset of rapid palpitations that started in December 2022.  Zio patch had revealed episodes of atrial tachycardia but no atrial fibrillation.  I reviewed the results of the echocardiogram, mitral valve regurgitation has  slightly worsened compared to previous, she now has moderate to severe MR.  However she is clinically asymptomatic without clinical evidence of heart failure.  I would like to repeat echocardiogram in a year and see her back at  that time.  With regard to palpitations, she is stable since increasing the dose of the metoprolol succinate from 25 mg daily to twice daily and she can continue to use extra dose on a as needed basis.  I advised her that if episodes last >6 hours to immediately contact me.  Presently episodes are few and are lasting anywhere from 15 to 30 minutes.  She has had 2 episodes since she last seen me 3 months ago.  Blood pressure is well controlled, otherwise she is on appropriate medical therapy, she does have stage III/B chronic kidney disease that has remained stable as well.      Adrian Prows, MD, [facility] 05/10/2022, 10:21 AM Office: [phone] Fax: [phone] Pager: [phone]

## 2022-05-26 DIAGNOSIS — M810 Age-related osteoporosis without current pathological fracture: Secondary | ICD-10-CM | POA: Diagnosis not present

## 2022-06-02 DIAGNOSIS — B078 Other viral warts: Secondary | ICD-10-CM | POA: Diagnosis not present

## 2022-06-25 ENCOUNTER — Telehealth: Payer: Self-pay

## 2022-06-25 NOTE — Telephone Encounter (Signed)
VM 1110 : Patient called and left a message.   Message is as follows:  "hi this is [name] my date of birth is 09-Sep-1941 patient of Dr Einar Gip I was in on July 10th and he said if I had an issue with my racing heart to let him know and I just wanted to let you know that I that I know of three issues the first one lasted about 30 minutes to 2nd would last about an hour and the last one this week last about 2 hours so I just wanted that to be noted in my record and make Dr. Einar Gip and you aware of that and if you need any information give me a call my number is [phone] thanks."  I called patient back and patient informed me that she  has worn a heart monitor and was told to continue to monitor and this is what she got, because not much was found with the monitor the first time.  1st episode lasted 30 minutes.  2nd episode lasted 1 hour.  3rd episode lasted 2 hours.  Do you want her to schedule an appointment to see you or do you want her to wear another monitor? Per patient. Please advise.

## 2022-06-27 NOTE — Telephone Encounter (Signed)
If she is still having wpisodes and not controlled by extra dose metoporolol, then 14 day not live monitoring

## 2022-07-01 NOTE — Telephone Encounter (Signed)
Called patient, NA, LMAM

## 2022-07-02 NOTE — Telephone Encounter (Signed)
Called and spoke to patient she said she has not had any episode since she called she wants to wait and see if it happen again and than she will consider a repeat monitor

## 2022-08-01 DIAGNOSIS — Z23 Encounter for immunization: Secondary | ICD-10-CM | POA: Diagnosis not present

## 2022-09-28 DIAGNOSIS — Z6827 Body mass index (BMI) 27.0-27.9, adult: Secondary | ICD-10-CM | POA: Diagnosis not present

## 2022-09-28 DIAGNOSIS — Z1231 Encounter for screening mammogram for malignant neoplasm of breast: Secondary | ICD-10-CM | POA: Diagnosis not present

## 2022-09-28 DIAGNOSIS — Z7983 Long term (current) use of bisphosphonates: Secondary | ICD-10-CM | POA: Diagnosis not present

## 2022-09-28 DIAGNOSIS — Z01419 Encounter for gynecological examination (general) (routine) without abnormal findings: Secondary | ICD-10-CM | POA: Diagnosis not present

## 2022-09-28 DIAGNOSIS — N958 Other specified menopausal and perimenopausal disorders: Secondary | ICD-10-CM | POA: Diagnosis not present

## 2022-09-28 DIAGNOSIS — M8588 Other specified disorders of bone density and structure, other site: Secondary | ICD-10-CM | POA: Diagnosis not present

## 2022-09-28 DIAGNOSIS — R29890 Loss of height: Secondary | ICD-10-CM | POA: Diagnosis not present

## 2022-11-11 DIAGNOSIS — M810 Age-related osteoporosis without current pathological fracture: Secondary | ICD-10-CM | POA: Diagnosis not present

## 2022-11-11 DIAGNOSIS — D509 Iron deficiency anemia, unspecified: Secondary | ICD-10-CM | POA: Diagnosis not present

## 2022-11-11 DIAGNOSIS — I10 Essential (primary) hypertension: Secondary | ICD-10-CM | POA: Diagnosis not present

## 2022-11-12 ENCOUNTER — Telehealth: Payer: Self-pay | Admitting: *Deleted

## 2022-11-12 ENCOUNTER — Telehealth: Payer: Self-pay | Admitting: Hematology and Oncology

## 2022-11-12 ENCOUNTER — Encounter: Payer: Self-pay | Admitting: Hematology and Oncology

## 2022-11-12 NOTE — Telephone Encounter (Signed)
Pt called with c/o being symptomatic to iron deficiency. Stated Dr.Gudena advised to call if she became symptomatic again. Scheduling message sent to f/u with scheduling and office visit and labs

## 2022-11-12 NOTE — Telephone Encounter (Signed)
Scheduled appointment per 1/12 staff message. Patient is aware of made appointment.

## 2022-11-15 ENCOUNTER — Inpatient Hospital Stay: Payer: PPO | Attending: Hematology and Oncology | Admitting: Hematology and Oncology

## 2022-11-15 ENCOUNTER — Telehealth: Payer: Self-pay | Admitting: Hematology and Oncology

## 2022-11-15 VITALS — BP 122/80 | HR 75 | Temp 97.9°F | Resp 16 | Ht 67.0 in | Wt 158.7 lb

## 2022-11-15 DIAGNOSIS — D509 Iron deficiency anemia, unspecified: Secondary | ICD-10-CM | POA: Diagnosis not present

## 2022-11-15 NOTE — Telephone Encounter (Signed)
Scheduled appointments per 1/15 los. Patient is aware.

## 2022-11-15 NOTE — Assessment & Plan Note (Signed)
Diagnosed in 2018 and underwent upper endoscopy and colonoscopy by Dr. 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   Lab review 05/14/2019: Ferritin 13, iron saturation 19%, hemoglobin 12.3 11/16/2019: Ferritin 27, iron saturation 16%, hemoglobin 12.6 11/06/2021: Hemoglobin 10.5, MCV 77.6, TIBC 442, iron saturation 4% 03/25/2022: Hemoglobin 12.8, MCV 90, TIBC 466, iron saturation 20%, ferritin 16 11/12/2022: Hemoglobin 10.7, MCV 79.1, platelets 180, ferritin 9.9, creatinine 1.01  Patient is starting to become anemic once again and iron deficient. Based on symptoms, recommended IV iron infusion  Labs in 6 months and follow-up 2 days later with a telephone visit.

## 2022-11-15 NOTE — Progress Notes (Signed)
Patient Care Team: Deland Pretty, MD as PCP - General (Internal Medicine)  DIAGNOSIS:  Encounter Diagnosis  Name Primary?   Iron deficiency anemia, unspecified iron deficiency anemia type Yes   CHIEF COMPLIANT: Experience symptoms from iron deficiency  INTERVAL HISTORY: [name] is a 82 y.o. with above-mentioned history of iron deficiency anemia treated with IV iron. She presents to the clinic today for follow-up.  She states she has been having some fatigue and starting to eat more ice. She denies blood in the stool.    ALLERGIES:  is allergic to crestor [rosuvastatin], glycopyrrolate, and codeine.  MEDICATIONS:  Current Outpatient Medications  Medication Sig Dispense Refill   b complex vitamins tablet Take 1 tablet by mouth daily.       Bempedoic Acid-Ezetimibe (NEXLIZET) 180-10 MG TABS Take by mouth.     calcium carbonate (OS-CAL - DOSED IN MG OF ELEMENTAL CALCIUM) 1250 MG tablet Take 1 tablet by mouth daily.       Cholecalciferol (VITAMIN D) 2000 UNITS tablet Take 2,000 Units by mouth daily.       denosumab (PROLIA) 60 MG/ML SOSY injection Inject into the skin See admin instructions.     Garlic 7322 MG CAPS Take 1 capsule by mouth daily.     Krill Oil Omega-3 300 MG CAPS Take 1 capsule by mouth daily.     loperamide (IMODIUM) 2 MG capsule Take 1 capsule (2 mg total) by mouth 4 (four) times daily as needed for diarrhea or loose stools. 12 capsule 0   meclizine (ANTIVERT) 25 MG tablet Take 25 mg by mouth as needed for dizziness.     metoprolol succinate (TOPROL-XL) 25 MG 24 hr tablet Take 25 mg by mouth 2 (two) times daily.  Take additional tablet for palpitations     Misc Natural Products (GLUCOSAMINE CHOND MSM FORMULA) TABS See admin instructions.     Multiple Vitamins-Minerals (MULTIVITAMINS THER. W/MINERALS) TABS Take 1 tablet by mouth daily.       pantoprazole (PROTONIX) 40 MG tablet Take 40 mg by mouth daily.     triamterene-hydrochlorothiazide (MAXZIDE-25) 37.5-25 MG  tablet Take 0.5-1 tablets by mouth every morning.     Zinc 100 MG TABS Take by mouth.     No current facility-administered medications for this visit.    PHYSICAL EXAMINATION: ECOG PERFORMANCE STATUS: 1 - Symptomatic but completely ambulatory  Vitals:   11/15/22 0813  BP: 122/80  Pulse: 75  Resp: 16  Temp: 97.9 F (36.6 C)  SpO2: 98%   Filed Weights   11/15/22 0813  Weight: 158 lb 11.2 oz (72 kg)     LABORATORY DATA:  I have reviewed the data as listed    Latest Ref Rng & Units 04/29/2021   11:02 AM 01/13/2021    2:38 AM 01/10/2021    9:08 AM  CMP  Glucose 65 - 99 mg/dL 90  107  141   BUN 8 - 27 mg/dL '23  11  14   '$ Creatinine 0.57 - 1.00 mg/dL 1.13  0.88  0.88   Sodium 134 - 144 mmol/L 137  136  139   Potassium 3.5 - 5.2 mmol/L 4.9  3.0  3.4   Chloride 96 - 106 mmol/L 103  104  104   CO2 20 - 29 mmol/L '23  21  24   '$ Calcium 8.7 - 10.3 mg/dL 9.3  7.2  8.7   Total Protein 6.0 - 8.5 g/dL 6.4   7.0   Total Bilirubin 0.0 -  1.2 mg/dL 0.5   1.0   Alkaline Phos 44 - 121 IU/L 39   36   AST 0 - 40 IU/L 30   30   ALT 0 - 32 IU/L 19   23     Lab Results  Component Value Date   WBC 7.8 03/25/2022   HGB 12.8 03/25/2022   HCT 37.8 03/25/2022   MCV 90.0 03/25/2022   PLT 195 03/25/2022   NEUTROABS 5.0 03/25/2022    ASSESSMENT & PLAN:  Iron deficiency anemia Diagnosed in 2018 and underwent upper endoscopy and colonoscopy by Dr. 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   Lab review 05/14/2019: Ferritin 13, iron saturation 19%, hemoglobin 12.3 11/16/2019: Ferritin 27, iron saturation 16%, hemoglobin 12.6 11/06/2021: Hemoglobin 10.5, MCV 77.6, TIBC 442, iron saturation 4% 03/25/2022: Hemoglobin 12.8, MCV 90, TIBC 466, iron saturation 20%, ferritin 16 11/12/2022: Hemoglobin 10.7, MCV 79.1, platelets 180, ferritin 9.9,  creatinine 1.01  Patient is starting to become anemic once again and iron deficient. Based on symptoms, recommended IV iron infusion  RTC on an as needed basis. She will call us if she gets ice chip cravings.   No orders of the defined types were placed in this encounter.  The patient has a good understanding of the overall plan. she agrees with it. she will call with any problems that may develop before the next visit here. Total time spent: 30 mins including face to face time and time spent for planning, charting and co-ordination of care   Harriette Ohara, MD 11/15/22  I Gardiner Coins is acting as a Education administrator for Dr [name] Adie

## 2022-11-16 DIAGNOSIS — Z23 Encounter for immunization: Secondary | ICD-10-CM | POA: Diagnosis not present

## 2022-11-16 DIAGNOSIS — R002 Palpitations: Secondary | ICD-10-CM | POA: Diagnosis not present

## 2022-11-16 DIAGNOSIS — M810 Age-related osteoporosis without current pathological fracture: Secondary | ICD-10-CM | POA: Diagnosis not present

## 2022-11-16 DIAGNOSIS — E7800 Pure hypercholesterolemia, unspecified: Secondary | ICD-10-CM | POA: Diagnosis not present

## 2022-11-16 DIAGNOSIS — D509 Iron deficiency anemia, unspecified: Secondary | ICD-10-CM | POA: Diagnosis not present

## 2022-11-16 DIAGNOSIS — I10 Essential (primary) hypertension: Secondary | ICD-10-CM | POA: Diagnosis not present

## 2022-11-16 DIAGNOSIS — I341 Nonrheumatic mitral (valve) prolapse: Secondary | ICD-10-CM | POA: Diagnosis not present

## 2022-11-16 DIAGNOSIS — Z0000 Encounter for general adult medical examination without abnormal findings: Secondary | ICD-10-CM | POA: Diagnosis not present

## 2022-11-17 ENCOUNTER — Telehealth: Payer: Self-pay | Admitting: Hematology and Oncology

## 2022-11-17 NOTE — Telephone Encounter (Signed)
Rescheduled appointments per 1/17 secure chat. Patient is aware of the changes made to her upcoming appointments.

## 2022-11-22 ENCOUNTER — Other Ambulatory Visit: Payer: Self-pay

## 2022-11-22 ENCOUNTER — Inpatient Hospital Stay: Payer: PPO

## 2022-11-22 VITALS — BP 107/56 | HR 64 | Temp 98.1°F | Resp 16 | Wt 159.0 lb

## 2022-11-22 DIAGNOSIS — D509 Iron deficiency anemia, unspecified: Secondary | ICD-10-CM

## 2022-11-22 MED ORDER — SODIUM CHLORIDE 0.9 % IV SOLN
300.0000 mg | Freq: Once | INTRAVENOUS | Status: AC
  Administered 2022-11-22: 300 mg via INTRAVENOUS
  Filled 2022-11-22: qty 300

## 2022-11-22 MED ORDER — SODIUM CHLORIDE 0.9 % IV SOLN: Freq: Once | INTRAVENOUS | Status: AC

## 2022-11-22 NOTE — Patient Instructions (Signed)

## 2022-11-22 NOTE — Progress Notes (Signed)
Pt observed for 30 minutes post Venofer infusion. Pt tolerated trtmt well w/out incident. VSS at discharge.  Ambulatory to lobby.

## 2022-11-29 ENCOUNTER — Other Ambulatory Visit: Payer: Self-pay

## 2022-11-29 ENCOUNTER — Inpatient Hospital Stay: Payer: PPO

## 2022-11-29 ENCOUNTER — Encounter: Payer: Self-pay | Admitting: Hematology and Oncology

## 2022-11-29 VITALS — BP 99/52 | HR 72 | Resp 16

## 2022-11-29 DIAGNOSIS — D509 Iron deficiency anemia, unspecified: Secondary | ICD-10-CM

## 2022-11-29 MED ORDER — SODIUM CHLORIDE 0.9 % IV SOLN: Freq: Once | INTRAVENOUS | Status: AC

## 2022-11-29 MED ORDER — SODIUM CHLORIDE 0.9 % IV SOLN
300.0000 mg | Freq: Once | INTRAVENOUS | Status: AC
  Administered 2022-11-29: 300 mg via INTRAVENOUS
  Filled 2022-11-29: qty 300

## 2022-11-29 NOTE — Patient Instructions (Signed)
Iron Sucrose Injection What is this medication? IRON SUCROSE (EYE ern SOO krose) treats low levels of iron (iron deficiency anemia) in people with kidney disease. Iron is a mineral that plays an important role in making red blood cells, which carry oxygen from your lungs to the rest of your body. This medicine may be used for other purposes; ask your health care provider or pharmacist if you have questions. COMMON BRAND NAME(S): Venofer What should I tell my care team before I take this medication? They need to know if you have any of these conditions: Anemia not caused by low iron levels Heart disease High levels of iron in the blood Kidney disease Liver disease An unusual or allergic reaction to iron, other medications, foods, dyes, or preservatives Pregnant or trying to get pregnant Breastfeeding How should I use this medication? This medication is for infusion into a vein. It is given in a hospital or clinic setting. Talk to your care team about the use of this medication in children. While this medication may be prescribed for children as young as 2 years for selected conditions, precautions do apply. Overdosage: If you think you have taken too much of this medicine contact a poison control center or emergency room at once. NOTE: This medicine is only for you. Do not share this medicine with others. What if I miss a dose? Keep appointments for follow-up doses. It is important not to miss your dose. Call your care team if you are unable to keep an appointment. What may interact with this medication? Do not take this medication with any of the following: Deferoxamine Dimercaprol Other iron products This medication may also interact with the following: Chloramphenicol Deferasirox This list may not describe all possible interactions. Give your health care provider a list of all the medicines, herbs, non-prescription drugs, or dietary supplements you use. Also tell them if you smoke,  drink alcohol, or use illegal drugs. Some items may interact with your medicine. What should I watch for while using this medication? Visit your care team regularly. Tell your care team if your symptoms do not start to get better or if they get worse. You may need blood work done while you are taking this medication. You may need to follow a special diet. Talk to your care team. Foods that contain iron include: whole grains/cereals, dried fruits, beans, or peas, leafy green vegetables, and organ meats (liver, kidney). What side effects may I notice from receiving this medication? Side effects that you should report to your care team as soon as possible: Allergic reactions--skin rash, itching, hives, swelling of the face, lips, tongue, or throat Low blood pressure--dizziness, feeling faint or lightheaded, blurry vision Shortness of breath Side effects that usually do not require medical attention (report to your care team if they continue or are bothersome): Flushing Headache Joint pain Muscle pain Nausea Pain, redness, or irritation at injection site This list may not describe all possible side effects. Call your doctor for medical advice about side effects. You may report side effects to FDA at 1-800-FDA-1088. Where should I keep my medication? This medication is given in a hospital or clinic and will not be stored at home. NOTE: This sheet is a summary. It may not cover all possible information. If you have questions about this medicine, talk to your doctor, pharmacist, or health care provider.  2023 Elsevier/Gold Standard (2021-01-29 00:00:00)

## 2022-12-02 DIAGNOSIS — M810 Age-related osteoporosis without current pathological fracture: Secondary | ICD-10-CM | POA: Diagnosis not present

## 2022-12-02 HISTORY — PX: OTHER SURGICAL HISTORY: SHX169

## 2022-12-06 ENCOUNTER — Other Ambulatory Visit: Payer: Self-pay

## 2022-12-06 ENCOUNTER — Inpatient Hospital Stay: Payer: PPO | Attending: Hematology and Oncology

## 2022-12-06 VITALS — BP 112/61 | HR 62 | Temp 98.2°F | Resp 17

## 2022-12-06 DIAGNOSIS — D509 Iron deficiency anemia, unspecified: Secondary | ICD-10-CM | POA: Diagnosis not present

## 2022-12-06 DIAGNOSIS — N920 Excessive and frequent menstruation with regular cycle: Secondary | ICD-10-CM | POA: Insufficient documentation

## 2022-12-06 MED ORDER — SODIUM CHLORIDE 0.9 % IV SOLN
300.0000 mg | Freq: Once | INTRAVENOUS | Status: AC
  Administered 2022-12-06: 300 mg via INTRAVENOUS
  Filled 2022-12-06: qty 300

## 2022-12-06 MED ORDER — SODIUM CHLORIDE 0.9 % IV SOLN: Freq: Once | INTRAVENOUS | Status: AC

## 2022-12-06 NOTE — Patient Instructions (Signed)

## 2022-12-24 ENCOUNTER — Encounter: Payer: Self-pay | Admitting: Hematology and Oncology

## 2022-12-27 DIAGNOSIS — H903 Sensorineural hearing loss, bilateral: Secondary | ICD-10-CM | POA: Diagnosis not present

## 2023-02-03 DIAGNOSIS — G720 Drug-induced myopathy: Secondary | ICD-10-CM | POA: Diagnosis not present

## 2023-02-03 DIAGNOSIS — J209 Acute bronchitis, unspecified: Secondary | ICD-10-CM | POA: Diagnosis not present

## 2023-02-03 DIAGNOSIS — T466X5A Adverse effect of antihyperlipidemic and antiarteriosclerotic drugs, initial encounter: Secondary | ICD-10-CM | POA: Diagnosis not present

## 2023-02-03 DIAGNOSIS — I700 Atherosclerosis of aorta: Secondary | ICD-10-CM | POA: Diagnosis not present

## 2023-02-22 DIAGNOSIS — H903 Sensorineural hearing loss, bilateral: Secondary | ICD-10-CM | POA: Diagnosis not present

## 2023-03-09 DIAGNOSIS — B078 Other viral warts: Secondary | ICD-10-CM | POA: Diagnosis not present

## 2023-03-25 NOTE — Progress Notes (Signed)
HEMATOLOGY-ONCOLOGY TELEPHONE VISIT PROGRESS NOTE  I connected with our patient on 03/31/23 at  8:15 AM EDT by telephone and verified that I am speaking with the correct person using two identifiers.  I discussed the limitations, risks, security and privacy concerns of performing an evaluation and management service by telephone and the availability of in person appointments.  I also discussed with the patient that there may be a patient responsible charge related to this service. The patient expressed understanding and agreed to proceed.   History of Present Illness: [name] is a 82 y.o. with above-mentioned history of iron deficiency anemia treated with IV iron. She presents to the clinic today for a telephone follow-up.   REVIEW OF SYSTEMS:   Constitutional: Denies fevers, chills or abnormal weight loss All other systems were reviewed with the patient and are negative. Observations/Objective:     Assessment Plan:  Iron deficiency anemia Diagnosed in 2018 and underwent upper endoscopy and colonoscopy by Dr. 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   Lab review 05/14/2019: Ferritin 13, iron saturation 19%, hemoglobin 12.3 11/16/2019: Ferritin 27, iron saturation 16%, hemoglobin 12.6 11/06/2021: Hemoglobin 10.5, MCV 77.6, TIBC 442, iron saturation 4% 03/25/2022: Hemoglobin 12.8, MCV 90, TIBC 466, iron saturation 20%, ferritin 16 11/12/2022: Hemoglobin 10.7, MCV 79.1, platelets 180, ferritin 9.9, creatinine 1.01 03/29/2023: Hemoglobin 11.8, MCV 89.1, TIBC 490, iron saturation 11%, ferritin 8  I discussed with the patient that the ferritin is still below 10 but she does not have any symptoms of iron deficiency like ice chip cravings which she normally gets. Therefore we decided to watch and monitor with a 34-month lab and  telephone visit 2 days later to discuss results. If in the interim she starts develop any symptoms of iron deficiency we can recheck the labs sooner and consider IV iron therapy sooner.    I discussed the assessment and treatment plan with the patient. The patient was provided an opportunity to ask questions and all were answered. The patient agreed with the plan and demonstrated an understanding of the instructions. The patient was advised to call back or seek an in-person evaluation if the symptoms worsen or if the condition fails to improve as anticipated.   I provided 12 minutes of non-face-to-face time during this encounter.  This includes time for charting and coordination of care   Tamsen Meek, MD  I Janan Ridge am acting as a scribe for Dr.Vinay Gudena  I have reviewed the above documentation for accuracy and completeness, and I agree with the above.

## 2023-03-29 ENCOUNTER — Other Ambulatory Visit: Payer: Self-pay

## 2023-03-29 ENCOUNTER — Inpatient Hospital Stay: Payer: PPO | Attending: Hematology and Oncology

## 2023-03-29 DIAGNOSIS — D509 Iron deficiency anemia, unspecified: Secondary | ICD-10-CM | POA: Diagnosis not present

## 2023-03-29 LAB — CBC WITH DIFFERENTIAL (CANCER CENTER ONLY)
Abs Immature Granulocytes: 0.04 10*3/uL (ref 0.00–0.07)
Basophils Absolute: 0.1 10*3/uL (ref 0.0–0.1)
Basophils Relative: 1 %
Eosinophils Absolute: 0.2 10*3/uL (ref 0.0–0.5)
Eosinophils Relative: 2 %
HCT: 35.8 % — ABNORMAL LOW (ref 36.0–46.0)
Hemoglobin: 11.8 g/dL — ABNORMAL LOW (ref 12.0–15.0)
Immature Granulocytes: 1 %
Lymphocytes Relative: 18 %
Lymphs Abs: 1.5 10*3/uL (ref 0.7–4.0)
MCH: 29.4 pg (ref 26.0–34.0)
MCHC: 33 g/dL (ref 30.0–36.0)
MCV: 89.1 fL (ref 80.0–100.0)
Monocytes Absolute: 1 10*3/uL (ref 0.1–1.0)
Monocytes Relative: 13 %
Neutro Abs: 5.3 10*3/uL (ref 1.7–7.7)
Neutrophils Relative %: 65 %
Platelet Count: 178 10*3/uL (ref 150–400)
RBC: 4.02 MIL/uL (ref 3.87–5.11)
RDW: 13.5 % (ref 11.5–15.5)
WBC Count: 8 10*3/uL (ref 4.0–10.5)
nRBC: 0 % (ref 0.0–0.2)

## 2023-03-29 LAB — IRON AND IRON BINDING CAPACITY (CC-WL,HP ONLY)
Iron: 52 ug/dL (ref 28–170)
Saturation Ratios: 11 % (ref 10.4–31.8)
TIBC: 490 ug/dL — ABNORMAL HIGH (ref 250–450)
UIBC: 438 ug/dL (ref 148–442)

## 2023-03-29 LAB — FERRITIN: Ferritin: 8 ng/mL — ABNORMAL LOW (ref 11–307)

## 2023-03-31 ENCOUNTER — Inpatient Hospital Stay (HOSPITAL_BASED_OUTPATIENT_CLINIC_OR_DEPARTMENT_OTHER): Payer: PPO | Admitting: Hematology and Oncology

## 2023-03-31 DIAGNOSIS — D509 Iron deficiency anemia, unspecified: Secondary | ICD-10-CM

## 2023-03-31 NOTE — Assessment & Plan Note (Signed)
Diagnosed in 2018 and underwent upper endoscopy and colonoscopy by Dr. 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   Lab review 05/14/2019: Ferritin 13, iron saturation 19%, hemoglobin 12.3 11/16/2019: Ferritin 27, iron saturation 16%, hemoglobin 12.6 11/06/2021: Hemoglobin 10.5, MCV 77.6, TIBC 442, iron saturation 4% 03/25/2022: Hemoglobin 12.8, MCV 90, TIBC 466, iron saturation 20%, ferritin 16 11/12/2022: Hemoglobin 10.7, MCV 79.1, platelets 180, ferritin 9.9, creatinine 1.01 03/29/2023: Hemoglobin 11.8, MCV 89.1, TIBC 490, iron saturation 11%, ferritin 8  I discussed with the patient that the ferritin is once again below 10.

## 2023-04-04 ENCOUNTER — Telehealth: Payer: Self-pay | Admitting: Hematology and Oncology

## 2023-04-04 NOTE — Telephone Encounter (Signed)
Scheduled appointments per 5/30 los. Patient is aware of the made appointments. 

## 2023-05-10 ENCOUNTER — Ambulatory Visit: Payer: PPO

## 2023-05-10 DIAGNOSIS — I341 Nonrheumatic mitral (valve) prolapse: Secondary | ICD-10-CM | POA: Diagnosis not present

## 2023-05-10 DIAGNOSIS — I071 Rheumatic tricuspid insufficiency: Secondary | ICD-10-CM | POA: Diagnosis not present

## 2023-05-13 ENCOUNTER — Encounter: Payer: Self-pay | Admitting: Cardiology

## 2023-05-13 ENCOUNTER — Ambulatory Visit: Payer: PPO | Admitting: Cardiology

## 2023-05-13 VITALS — BP 115/62 | HR 60 | Ht 67.0 in | Wt 157.0 lb

## 2023-05-13 DIAGNOSIS — N1831 Chronic kidney disease, stage 3a: Secondary | ICD-10-CM | POA: Diagnosis not present

## 2023-05-13 DIAGNOSIS — I340 Nonrheumatic mitral (valve) insufficiency: Secondary | ICD-10-CM | POA: Diagnosis not present

## 2023-05-13 DIAGNOSIS — I341 Nonrheumatic mitral (valve) prolapse: Secondary | ICD-10-CM

## 2023-05-13 DIAGNOSIS — I10 Essential (primary) hypertension: Secondary | ICD-10-CM

## 2023-05-13 DIAGNOSIS — Q228 Other congenital malformations of tricuspid valve: Secondary | ICD-10-CM | POA: Diagnosis not present

## 2023-05-13 NOTE — Progress Notes (Unsigned)
Primary Physician/Referring:  Merri Brunette, MD  Patient ID: [name], female    DOB: July 05, 1941, 82 y.o.   MRN: [id number]  Chief Complaint  Patient presents with  . Mitral Valve Prolapse  . Follow-up   HPI:    [name]  is a 82 y.o. 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  I had seen her 3 months ago for new onset of rapid palpitations that started in December 2022.  Zio patch had revealed episodes of atrial tachycardia but no atrial fibrillation.   Past Medical History:  Diagnosis Date  . Anemia   . Arthritis   . Cataract   . Diverticulosis of colon   . Endometrial cancer (HCC)    1A grade endometrial ca  . Essential hypertension 01/24/2009   Qualifier: Diagnosis of  By: Nelson-Smith CMA (AAMA), Dottie    . Hx of radiation therapy 04/24/08-05/29/08& 8/12/,8/19,&06/27/08   external beam  through 7/09 ,then intracavity in 06/2008  . Hypertension   . 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   Social History   Tobacco Use  . Smoking status: Never  . Smokeless tobacco: Never  Substance Use Topics  . Alcohol use: Yes    Alcohol/week: 0.0 standard drinks of alcohol    Comment: less then 2 a month   ROS  Review of Systems  Cardiovascular:  Negative for chest pain, dyspnea on exertion, leg swelling and palpitations.   Objective  Blood pressure 115/62, pulse 60, height 5\' 7"  (1.702 m), weight 157 lb (71.2 kg), SpO2 96%.     05/13/2023    1:03 PM 12/06/2022   10:34 AM 12/06/2022    8:05 AM  Vitals with BMI  Height 5\' 7"     Weight 157 lbs    BMI 24.58    Systolic 115 112 213  Diastolic 62 61 57  Pulse 60  62 68     Physical Exam Constitutional:      Comments: She is moderately built and well nurished  Neck:     Vascular: No carotid bruit or JVD.  Cardiovascular:     Rate and Rhythm: Normal rate and regular rhythm.     Pulses: Intact distal pulses.     Heart sounds: S1 normal and S2 normal. Murmur heard.     Mid to late systolic murmur is present with a grade of 3/6 at the lower left sternal border and apex.     No gallop.  Pulmonary:     Effort: Pulmonary effort is normal.     Breath sounds: Normal breath sounds.  Abdominal:     General: Bowel sounds are normal.     Palpations: Abdomen is soft.   Laboratory examination:   Lab Results  Component Value Date   NA 137 04/29/2021   K 4.9 04/29/2021   CO2 23 04/29/2021   GLUCOSE 90 04/29/2021   BUN 23 04/29/2021   CREATININE 1.13 (H) 04/29/2021   CALCIUM 9.3 04/29/2021   GFR 47.21 (L) 01/15/2020   EGFR 50 (L) 04/29/2021   GFRNONAA >60 01/13/2021       Latest Ref Rng & Units 04/29/2021   11:02 AM 01/13/2021    2:38 AM 01/10/2021  9:08 AM  CMP  Glucose 65 - 99 mg/dL 90  409  811   BUN 8 - 27 mg/dL 23  11  14    Creatinine 0.57 - 1.00 mg/dL 9.14  7.82  9.56   Sodium 134 - 144 mmol/L 137  136  139   Potassium 3.5 - 5.2 mmol/L 4.9  3.0  3.4   Chloride 96 - 106 mmol/L 103  104  104   CO2 20 - 29 mmol/L 23  21  24    Calcium 8.7 - 10.3 mg/dL 9.3  7.2  8.7   Total Protein 6.0 - 8.5 g/dL 6.4   7.0   Total Bilirubin 0.0 - 1.2 mg/dL 0.5   1.0   Alkaline Phos 44 - 121 IU/L 39   36   AST 0 - 40 IU/L 30   30   ALT 0 - 32 IU/L 19   23       Latest Ref Rng & Units 03/29/2023    8:24 AM 03/25/2022    7:55 AM 01/13/2021    2:38 AM  CBC  WBC 4.0 - 10.5 K/uL 8.0  7.8  5.6   Hemoglobin 12.0 - 15.0 g/dL 21.3  08.6  57.8   Hematocrit 36.0 - 46.0 % 35.8  37.8  34.0   Platelets 150 - 400 K/uL 178  195  197    External labs:  Labs 11/23/2021:  Stool Hemoccult negative.  Total cholesterol 97, triglycerides 99, HDL 33, LDL 44.  Hb  10.5/HCT 33.3, platelets 248.  BUN 26, creatinine 1.28, EGFR 40 mL, potassium 5.1.  LFTs normal.  Urinary protein negative. Medications and allergies   Allergies  Allergen Reactions  . Crestor [Rosuvastatin] Other (See Comments)    Leg cramps  . Glycopyrrolate     Other reaction(s): extreme dry mouth  . Codeine Other (See Comments)    Hyper     Current Outpatient Medications:  .  b complex vitamins tablet, Take 1 tablet by mouth daily.  , Disp: , Rfl:  .  Bempedoic Acid-Ezetimibe (NEXLIZET) 180-10 MG TABS, Take by mouth., Disp: , Rfl:  .  calcium carbonate (OS-CAL - DOSED IN MG OF ELEMENTAL CALCIUM) 1250 MG tablet, Take 1 tablet by mouth daily.  , Disp: , Rfl:  .  Cholecalciferol (VITAMIN D) 2000 UNITS tablet, Take 2,000 Units by mouth daily.  , Disp: , Rfl:  .  denosumab (PROLIA) 60 MG/ML SOSY injection, Inject into the skin See admin instructions., Disp: , Rfl:  .  Garlic 1000 MG CAPS, Take 1 capsule by mouth daily., Disp: , Rfl:  .  Krill Oil Omega-3 300 MG CAPS, Take 1 capsule by mouth daily., Disp: , Rfl:  .  loperamide (IMODIUM) 2 MG capsule, Take 1 capsule (2 mg total) by mouth 4 (four) times daily as needed for diarrhea or loose stools., Disp: 12 capsule, Rfl: 0 .  meclizine (ANTIVERT) 25 MG tablet, Take 25 mg by mouth as needed for dizziness., Disp: , Rfl:  .  metoprolol succinate (TOPROL-XL) 25 MG 24 hr tablet, Take 25 mg by mouth 2 (two) times daily.  Take additional tablet for palpitations, Disp: , Rfl:  .  Misc Natural Products (GLUCOSAMINE CHOND MSM FORMULA) TABS, See admin instructions., Disp: , Rfl:  .  Multiple Vitamins-Minerals (MULTIVITAMINS THER. W/MINERALS) TABS, Take 1 tablet by mouth daily.  , Disp: , Rfl:  .  pantoprazole (PROTONIX) 40 MG tablet, Take 40 mg by mouth daily., Disp: , Rfl:  .  triamterene-hydrochlorothiazide (MAXZIDE-25) 37.5-25 MG tablet, Take 0.5-1 tablets by mouth as needed., Disp: , Rfl:  .  Zinc 100 MG TABS, Take by mouth., Disp: , Rfl:     Radiology:  No results found.  Cardiac Studies:   Chest x-ray 12/30/11/2016: Negative two view chest x-ray  CT colonoscopy 01/04/2018: Mild colonic diverticulosis. No fixed polypoid filling defects or annular constricting lesions. No acute extra colonic abnormality in the abdomen or pelvis. Aortic atherosclerosis.  Lexiscan Tetrofosmin Stress Test  11/12/2019: Nondiagnostic ECG stress. Mild soft tissue attenuation noted in the inferior wall. No ischemia or scar. Stress LV EF: 70%.  No previous exam available for comparison. Low risk study.  Extended EKG monitoring 14 days starting 11/12/2021: Predominant rhythm is normal sinus rhythm.  Minimum heart rate 54, maximum heart rate 210 bpm.  There are frequent atrial tachycardia episodes, longest lasting 12 seconds and fastest 14 beats at 210 bpm.  1 episode of 3 beat NSVT, EKG = PACs. There are occasional PVCs and ventricular bigeminy.  There were no patient triggered events.  Echocardiogram 05/10/2023:   Normal LV systolic function with EF 59%. Left ventricle cavity is normal in size. Normal left ventricular wall thickness. Normal global wall motion. Indeterminate diastolic filling pattern due to severity of mitral regurgitation. Calculated EF 59%. Left atrial cavity is severely dilated at 46.8 ml/m^2. Right atrial cavity is mildly dilated. Trileaflet aortic valve. Trace aortic regurgitation. Moderate prolapse of the mitral valve leaflets. Severe (Grade III) mitral regurgitation, reversal of flow in the right pulmonary vein. Mild prolapse of the tricuspid valve leaflets. Mild tricuspid regurgitation. Mild pulmonary hypertension. RVSP measures 36 mmHg. IVC is normal with respiratory variation. Compared to the study done on 04/26/2022, mitral regurgitation severity has slightly progressed from moderately severe to severe.  No change in PA pressure.  No other significant changes.  EKG  EKG 05/10/2022: Normal sinus rhythm at rate of 60  bpm, normal axis, nonspecific T wave flattening inferior leads.  Compared to 01/14/2022, nonspecific T wave flattening in the inferior leads new, otherwise no significant change.  Assessment     ICD-10-CM   1. Mitral valve prolapse  I34.1 EKG 12-Lead    2. 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 Essential hypertension  I10       Recommendations:  No orders of the defined types were placed in this encounter.   [name]  is a 82 y.o. 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 Severe mitral regurgitation  1.  Severe mitral regurgitation *** - PCV ECHOCARDIOGRAM COMPLETE; Future  2. Mitral valve prolapse *** - EKG 12-Lead - PCV ECHOCARDIOGRAM COMPLETE; Future  3. Tricuspid valve prolapse *** - PCV ECHOCARDIOGRAM COMPLETE; Future  4. Essential hypertension ***  5. Stage 3a chronic kidney disease (HCC) Blood pressure is well controlled, otherwise she is on appropriate medical therapy, she does have stage III/a-b chronic kidney disease that has remained stable as well.      Yates Decamp, MD, [facility] 05/13/2023, 1:32 PM Office: [phone] Fax: [phone] Pager: [phone]

## 2023-05-16 DIAGNOSIS — H35371 Puckering of macula, right eye: Secondary | ICD-10-CM | POA: Diagnosis not present

## 2023-05-16 DIAGNOSIS — Z961 Presence of intraocular lens: Secondary | ICD-10-CM | POA: Diagnosis not present

## 2023-05-16 DIAGNOSIS — H52203 Unspecified astigmatism, bilateral: Secondary | ICD-10-CM | POA: Diagnosis not present

## 2023-05-31 ENCOUNTER — Other Ambulatory Visit: Payer: Self-pay

## 2023-05-31 ENCOUNTER — Telehealth: Payer: Self-pay | Admitting: Hematology and Oncology

## 2023-05-31 ENCOUNTER — Inpatient Hospital Stay: Payer: PPO | Attending: Hematology and Oncology

## 2023-05-31 DIAGNOSIS — D509 Iron deficiency anemia, unspecified: Secondary | ICD-10-CM | POA: Diagnosis not present

## 2023-05-31 LAB — CBC WITH DIFFERENTIAL (CANCER CENTER ONLY)
Abs Immature Granulocytes: 0.03 10*3/uL (ref 0.00–0.07)
Basophils Absolute: 0.1 10*3/uL (ref 0.0–0.1)
Basophils Relative: 1 %
Eosinophils Absolute: 0.2 10*3/uL (ref 0.0–0.5)
Eosinophils Relative: 2 %
HCT: 32.8 % — ABNORMAL LOW (ref 36.0–46.0)
Hemoglobin: 10.4 g/dL — ABNORMAL LOW (ref 12.0–15.0)
Immature Granulocytes: 0 %
Lymphocytes Relative: 25 %
Lymphs Abs: 1.8 10*3/uL (ref 0.7–4.0)
MCH: 26.6 pg (ref 26.0–34.0)
MCHC: 31.7 g/dL (ref 30.0–36.0)
MCV: 83.9 fL (ref 80.0–100.0)
Monocytes Absolute: 1 10*3/uL (ref 0.1–1.0)
Monocytes Relative: 13 %
Neutro Abs: 4.2 10*3/uL (ref 1.7–7.7)
Neutrophils Relative %: 59 %
Platelet Count: 191 10*3/uL (ref 150–400)
RBC: 3.91 MIL/uL (ref 3.87–5.11)
RDW: 13.1 % (ref 11.5–15.5)
WBC Count: 7.2 10*3/uL (ref 4.0–10.5)
nRBC: 0 % (ref 0.0–0.2)

## 2023-05-31 LAB — IRON AND IRON BINDING CAPACITY (CC-WL,HP ONLY)
Iron: 24 ug/dL — ABNORMAL LOW (ref 28–170)
Saturation Ratios: 5 % — ABNORMAL LOW (ref 10.4–31.8)
TIBC: 511 ug/dL — ABNORMAL HIGH (ref 250–450)
UIBC: 487 ug/dL — ABNORMAL HIGH (ref 148–442)

## 2023-05-31 LAB — FERRITIN: Ferritin: 7 ng/mL — ABNORMAL LOW (ref 11–307)

## 2023-05-31 NOTE — Telephone Encounter (Signed)
Rescheduled appointment per room resource. Left voicemail.  

## 2023-06-01 NOTE — Progress Notes (Signed)
HEMATOLOGY-ONCOLOGY TELEPHONE VISIT PROGRESS NOTE  I connected with our patient on 06/03/23 at  8:00 AM EDT by telephone and verified that I am speaking with the correct person using two identifiers.  I discussed the limitations, risks, security and privacy concerns of performing an evaluation and management service by telephone and the availability of in person appointments.  I also discussed with the patient that there may be a patient responsible charge related to this service. The patient expressed understanding and agreed to proceed.   History of Present Illness: Patient is starting to feel more fatigue and craving for ice chips.  REVIEW OF SYSTEMS:   Constitutional: Denies fevers, chills or abnormal weight loss All other systems were reviewed with the patient and are negative. Observations/Objective:     Assessment Plan:  Iron deficiency anemia Diagnosed in 2018 and underwent upper endoscopy and colonoscopy by Dr. 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   Lab review 05/14/2019: Ferritin 13, iron saturation 19%, hemoglobin 12.3 11/16/2019: Ferritin 27, iron saturation 16%, hemoglobin 12.6 11/06/2021: Hemoglobin 10.5, MCV 77.6, TIBC 442, iron saturation 4% 03/25/2022: Hemoglobin 12.8, MCV 90, TIBC 466, iron saturation 20%, ferritin 16 11/12/2022: Hemoglobin 10.7, MCV 79.1, platelets 180, ferritin 9.9, creatinine 1.01 03/29/2023: Hemoglobin 11.8, MCV 89.1, TIBC 490, iron saturation 11%, ferritin 8 05/31/2023: Hemoglobin 10.4, MCV 83.9, iron saturation 5%, TIBC 511, ferritin 7  Recommended IV iron therapy. Recheck labs in 6 months and telephone visit.    I discussed the assessment and treatment plan with the patient. The patient was provided an opportunity to ask questions and all were answered. The patient agreed with  the plan and demonstrated an understanding of the instructions. The patient was advised to call back or seek an in-person evaluation if the symptoms worsen or if the condition fails to improve as anticipated.   I provided 12 minutes of non-face-to-face time during this encounter.  This includes time for charting and coordination of care   Tamsen Meek, MD  I Janan Ridge am acting as a scribe for Dr.   I have reviewed the above documentation for accuracy and completeness, and I agree with the above.

## 2023-06-03 ENCOUNTER — Inpatient Hospital Stay: Payer: PPO | Attending: Hematology and Oncology | Admitting: Hematology and Oncology

## 2023-06-03 ENCOUNTER — Telehealth: Payer: PPO | Admitting: Hematology and Oncology

## 2023-06-03 DIAGNOSIS — D509 Iron deficiency anemia, unspecified: Secondary | ICD-10-CM | POA: Diagnosis not present

## 2023-06-03 NOTE — Assessment & Plan Note (Signed)
Diagnosed in 2018 and underwent upper endoscopy and colonoscopy by Dr. 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   Lab review 05/14/2019: Ferritin 13, iron saturation 19%, hemoglobin 12.3 11/16/2019: Ferritin 27, iron saturation 16%, hemoglobin 12.6 11/06/2021: Hemoglobin 10.5, MCV 77.6, TIBC 442, iron saturation 4% 03/25/2022: Hemoglobin 12.8, MCV 90, TIBC 466, iron saturation 20%, ferritin 16 11/12/2022: Hemoglobin 10.7, MCV 79.1, platelets 180, ferritin 9.9, creatinine 1.01 03/29/2023: Hemoglobin 11.8, MCV 89.1, TIBC 490, iron saturation 11%, ferritin 8 05/31/2023: Hemoglobin 10.4, MCV 83.9, iron saturation 5%, TIBC 511, ferritin 7  Recommended IV iron therapy. Recheck labs in 6 months and telephone visit.

## 2023-06-08 ENCOUNTER — Telehealth: Payer: Self-pay | Admitting: Hematology and Oncology

## 2023-06-14 DIAGNOSIS — M810 Age-related osteoporosis without current pathological fracture: Secondary | ICD-10-CM | POA: Diagnosis not present

## 2023-06-16 ENCOUNTER — Other Ambulatory Visit: Payer: Self-pay

## 2023-06-16 ENCOUNTER — Inpatient Hospital Stay: Payer: PPO

## 2023-06-16 VITALS — BP 101/52 | HR 62 | Temp 98.2°F | Resp 18

## 2023-06-16 DIAGNOSIS — D509 Iron deficiency anemia, unspecified: Secondary | ICD-10-CM

## 2023-06-16 MED ORDER — SODIUM CHLORIDE 0.9 % IV SOLN: Freq: Once | INTRAVENOUS | Status: AC

## 2023-06-16 MED ORDER — SODIUM CHLORIDE 0.9 % IV SOLN
300.0000 mg | Freq: Once | INTRAVENOUS | Status: AC
  Administered 2023-06-16: 300 mg via INTRAVENOUS
  Filled 2023-06-16: qty 300

## 2023-06-16 NOTE — Progress Notes (Signed)
Pt declined to stay for 30 minute observation period post Venofer infusion. Observed for 15 minutes. VSS. No complaints at time of discharge.

## 2023-06-16 NOTE — Patient Instructions (Signed)
 Iron Sucrose Injection What is this medication? IRON SUCROSE (EYE ern SOO krose) treats low levels of iron (iron deficiency anemia) in people with kidney disease. Iron is a mineral that plays an important role in making red blood cells, which carry oxygen from your lungs to the rest of your body. This medicine may be used for other purposes; ask your health care provider or pharmacist if you have questions. COMMON BRAND NAME(S): Venofer What should I tell my care team before I take this medication? They need to know if you have any of these conditions: Anemia not caused by low iron levels Heart disease High levels of iron in the blood Kidney disease Liver disease An unusual or allergic reaction to iron, other medications, foods, dyes, or preservatives Pregnant or trying to get pregnant Breastfeeding How should I use this medication? This medication is for infusion into a vein. It is given in a hospital or clinic setting. Talk to your care team about the use of this medication in children. While this medication may be prescribed for children as young as 2 years for selected conditions, precautions do apply. Overdosage: If you think you have taken too much of this medicine contact a poison control center or emergency room at once. NOTE: This medicine is only for you. Do not share this medicine with others. What if I miss a dose? Keep appointments for follow-up doses. It is important not to miss your dose. Call your care team if you are unable to keep an appointment. What may interact with this medication? Do not take this medication with any of the following: Deferoxamine Dimercaprol Other iron products This medication may also interact with the following: Chloramphenicol Deferasirox This list may not describe all possible interactions. Give your health care provider a list of all the medicines, herbs, non-prescription drugs, or dietary supplements you use. Also tell them if you smoke,  drink alcohol, or use illegal drugs. Some items may interact with your medicine. What should I watch for while using this medication? Visit your care team regularly. Tell your care team if your symptoms do not start to get better or if they get worse. You may need blood work done while you are taking this medication. You may need to follow a special diet. Talk to your care team. Foods that contain iron include: whole grains/cereals, dried fruits, beans, or peas, leafy green vegetables, and organ meats (liver, kidney). What side effects may I notice from receiving this medication? Side effects that you should report to your care team as soon as possible: Allergic reactions--skin rash, itching, hives, swelling of the face, lips, tongue, or throat Low blood pressure--dizziness, feeling faint or lightheaded, blurry vision Shortness of breath Side effects that usually do not require medical attention (report to your care team if they continue or are bothersome): Flushing Headache Joint pain Muscle pain Nausea Pain, redness, or irritation at injection site This list may not describe all possible side effects. Call your doctor for medical advice about side effects. You may report side effects to FDA at 1-800-FDA-1088. Where should I keep my medication? This medication is given in a hospital or clinic. It will not be stored at home. NOTE: This sheet is a summary. It may not cover all possible information. If you have questions about this medicine, talk to your doctor, pharmacist, or health care provider.  2024 Elsevier/Gold Standard (2023-03-25 00:00:00)

## 2023-06-21 ENCOUNTER — Telehealth: Payer: Self-pay | Admitting: Hematology and Oncology

## 2023-06-21 NOTE — Telephone Encounter (Signed)
Rescheduled appointment per patients request via incoming call. Patient is aware is aware of the changes made to her upcoming appointment.

## 2023-06-22 ENCOUNTER — Ambulatory Visit: Payer: PPO

## 2023-06-22 DIAGNOSIS — J069 Acute upper respiratory infection, unspecified: Secondary | ICD-10-CM | POA: Diagnosis not present

## 2023-06-22 DIAGNOSIS — J040 Acute laryngitis: Secondary | ICD-10-CM | POA: Diagnosis not present

## 2023-06-28 ENCOUNTER — Telehealth: Payer: Self-pay | Admitting: Hematology and Oncology

## 2023-06-28 NOTE — Telephone Encounter (Signed)
Rescheduled appointments per patients request. Patient is aware of the changes made to her upcoming appointments.

## 2023-06-29 ENCOUNTER — Inpatient Hospital Stay: Payer: PPO

## 2023-07-07 ENCOUNTER — Ambulatory Visit: Payer: PPO

## 2023-07-13 ENCOUNTER — Inpatient Hospital Stay: Payer: PPO | Attending: Hematology and Oncology

## 2023-07-13 VITALS — BP 92/51 | HR 62 | Temp 98.2°F | Resp 16

## 2023-07-13 DIAGNOSIS — D509 Iron deficiency anemia, unspecified: Secondary | ICD-10-CM | POA: Insufficient documentation

## 2023-07-13 MED ORDER — SODIUM CHLORIDE 0.9 % IV SOLN
300.0000 mg | Freq: Once | INTRAVENOUS | Status: AC
  Administered 2023-07-13: 300 mg via INTRAVENOUS
  Filled 2023-07-13: qty 300

## 2023-07-13 MED ORDER — SODIUM CHLORIDE 0.9 % IV SOLN: Freq: Once | INTRAVENOUS | Status: AC

## 2023-07-13 NOTE — Progress Notes (Signed)
Pt declined to be observed for 30 minutes, but willing to stay for 10 minutes post Venofer infusion. Pt tolerated Tx well w/out incident. VSS at discharge.  Ambulatory to lobby.

## 2023-07-13 NOTE — Patient Instructions (Signed)
Iron Sucrose Injection What is this medication? IRON SUCROSE (EYE ern SOO krose) treats low levels of iron (iron deficiency anemia) in people with kidney disease. Iron is a mineral that plays an important role in making red blood cells, which carry oxygen from your lungs to the rest of your body. This medicine may be used for other purposes; ask your health care provider or pharmacist if you have questions. COMMON BRAND NAME(S): Venofer What should I tell my care team before I take this medication? They need to know if you have any of these conditions: Anemia not caused by low iron levels Heart disease High levels of iron in the blood Kidney disease Liver disease An unusual or allergic reaction to iron, other medications, foods, dyes, or preservatives Pregnant or trying to get pregnant Breastfeeding How should I use this medication? This medication is for infusion into a vein. It is given in a hospital or clinic setting. Talk to your care team about the use of this medication in children. While this medication may be prescribed for children as young as 2 years for selected conditions, precautions do apply. Overdosage: If you think you have taken too much of this medicine contact a poison control center or emergency room at once. NOTE: This medicine is only for you. Do not share this medicine with others. What if I miss a dose? Keep appointments for follow-up doses. It is important not to miss your dose. Call your care team if you are unable to keep an appointment. What may interact with this medication? Do not take this medication with any of the following: Deferoxamine Dimercaprol Other iron products This medication may also interact with the following: Chloramphenicol Deferasirox This list may not describe all possible interactions. Give your health care provider a list of all the medicines, herbs, non-prescription drugs, or dietary supplements you use. Also tell them if you smoke,  drink alcohol, or use illegal drugs. Some items may interact with your medicine. What should I watch for while using this medication? Visit your care team regularly. Tell your care team if your symptoms do not start to get better or if they get worse. You may need blood work done while you are taking this medication. You may need to follow a special diet. Talk to your care team. Foods that contain iron include: whole grains/cereals, dried fruits, beans, or peas, leafy green vegetables, and organ meats (liver, kidney). What side effects may I notice from receiving this medication? Side effects that you should report to your care team as soon as possible: Allergic reactions--skin rash, itching, hives, swelling of the face, lips, tongue, or throat Low blood pressure--dizziness, feeling faint or lightheaded, blurry vision Shortness of breath Side effects that usually do not require medical attention (report to your care team if they continue or are bothersome): Flushing Headache Joint pain Muscle pain Nausea Pain, redness, or irritation at injection site This list may not describe all possible side effects. Call your doctor for medical advice about side effects. You may report side effects to FDA at 1-800-FDA-1088. Where should I keep my medication? This medication is given in a hospital or clinic. It will not be stored at home. NOTE: This sheet is a summary. It may not cover all possible information. If you have questions about this medicine, talk to your doctor, pharmacist, or health care provider.  2024 Elsevier/Gold Standard (2023-03-25 00:00:00)

## 2023-07-20 ENCOUNTER — Inpatient Hospital Stay: Payer: PPO

## 2023-07-20 VITALS — BP 103/53 | HR 61 | Temp 98.5°F | Resp 22

## 2023-07-20 DIAGNOSIS — D509 Iron deficiency anemia, unspecified: Secondary | ICD-10-CM | POA: Diagnosis not present

## 2023-07-20 MED ORDER — SODIUM CHLORIDE 0.9 % IV SOLN: Freq: Once | INTRAVENOUS | Status: AC

## 2023-07-20 MED ORDER — SODIUM CHLORIDE 0.9 % IV SOLN
300.0000 mg | Freq: Once | INTRAVENOUS | Status: AC
  Administered 2023-07-20: 300 mg via INTRAVENOUS
  Filled 2023-07-20: qty 300

## 2023-07-20 NOTE — Progress Notes (Signed)
Patient declined post iron observation.  Tolerated treatment well without incident.  VSS at discharge.  Ambulated to lobby.

## 2023-07-20 NOTE — Patient Instructions (Signed)
Iron Sucrose Injection What is this medication? IRON SUCROSE (EYE ern SOO krose) treats low levels of iron (iron deficiency anemia) in people with kidney disease. Iron is a mineral that plays an important role in making red blood cells, which carry oxygen from your lungs to the rest of your body. This medicine may be used for other purposes; ask your health care provider or pharmacist if you have questions. COMMON BRAND NAME(S): Venofer What should I tell my care team before I take this medication? They need to know if you have any of these conditions: Anemia not caused by low iron levels Heart disease High levels of iron in the blood Kidney disease Liver disease An unusual or allergic reaction to iron, other medications, foods, dyes, or preservatives Pregnant or trying to get pregnant Breastfeeding How should I use this medication? This medication is for infusion into a vein. It is given in a hospital or clinic setting. Talk to your care team about the use of this medication in children. While this medication may be prescribed for children as young as 2 years for selected conditions, precautions do apply. Overdosage: If you think you have taken too much of this medicine contact a poison control center or emergency room at once. NOTE: This medicine is only for you. Do not share this medicine with others. What if I miss a dose? Keep appointments for follow-up doses. It is important not to miss your dose. Call your care team if you are unable to keep an appointment. What may interact with this medication? Do not take this medication with any of the following: Deferoxamine Dimercaprol Other iron products This medication may also interact with the following: Chloramphenicol Deferasirox This list may not describe all possible interactions. Give your health care provider a list of all the medicines, herbs, non-prescription drugs, or dietary supplements you use. Also tell them if you smoke,  drink alcohol, or use illegal drugs. Some items may interact with your medicine. What should I watch for while using this medication? Visit your care team regularly. Tell your care team if your symptoms do not start to get better or if they get worse. You may need blood work done while you are taking this medication. You may need to follow a special diet. Talk to your care team. Foods that contain iron include: whole grains/cereals, dried fruits, beans, or peas, leafy green vegetables, and organ meats (liver, kidney). What side effects may I notice from receiving this medication? Side effects that you should report to your care team as soon as possible: Allergic reactions--skin rash, itching, hives, swelling of the face, lips, tongue, or throat Low blood pressure--dizziness, feeling faint or lightheaded, blurry vision Shortness of breath Side effects that usually do not require medical attention (report to your care team if they continue or are bothersome): Flushing Headache Joint pain Muscle pain Nausea Pain, redness, or irritation at injection site This list may not describe all possible side effects. Call your doctor for medical advice about side effects. You may report side effects to FDA at 1-800-FDA-1088. Where should I keep my medication? This medication is given in a hospital or clinic. It will not be stored at home. NOTE: This sheet is a summary. It may not cover all possible information. If you have questions about this medicine, talk to your doctor, pharmacist, or health care provider.  2024 Elsevier/Gold Standard (2023-03-25 00:00:00)

## 2023-10-03 DIAGNOSIS — Z6827 Body mass index (BMI) 27.0-27.9, adult: Secondary | ICD-10-CM | POA: Diagnosis not present

## 2023-10-03 DIAGNOSIS — Z1231 Encounter for screening mammogram for malignant neoplasm of breast: Secondary | ICD-10-CM | POA: Diagnosis not present

## 2023-10-03 DIAGNOSIS — Z01419 Encounter for gynecological examination (general) (routine) without abnormal findings: Secondary | ICD-10-CM | POA: Diagnosis not present

## 2023-10-03 DIAGNOSIS — Z803 Family history of malignant neoplasm of breast: Secondary | ICD-10-CM | POA: Diagnosis not present

## 2023-10-06 ENCOUNTER — Other Ambulatory Visit: Payer: Self-pay | Admitting: Obstetrics and Gynecology

## 2023-10-06 DIAGNOSIS — R928 Other abnormal and inconclusive findings on diagnostic imaging of breast: Secondary | ICD-10-CM

## 2023-10-15 ENCOUNTER — Ambulatory Visit
Admission: RE | Admit: 2023-10-15 | Discharge: 2023-10-15 | Disposition: A | Discharge status: Home / Self Care | Payer: PPO | Source: Ambulatory Visit | Attending: Obstetrics and Gynecology | Admitting: Obstetrics and Gynecology

## 2023-10-15 DIAGNOSIS — R928 Other abnormal and inconclusive findings on diagnostic imaging of breast: Secondary | ICD-10-CM

## 2023-10-15 DIAGNOSIS — N6002 Solitary cyst of left breast: Secondary | ICD-10-CM | POA: Diagnosis not present

## 2023-11-05 ENCOUNTER — Encounter: Payer: Self-pay | Admitting: Hematology and Oncology

## 2023-11-14 ENCOUNTER — Ambulatory Visit (HOSPITAL_COMMUNITY): Payer: Medicare HMO | Attending: Cardiology

## 2023-11-14 ENCOUNTER — Other Ambulatory Visit: Payer: PPO

## 2023-11-14 DIAGNOSIS — I341 Nonrheumatic mitral (valve) prolapse: Secondary | ICD-10-CM | POA: Diagnosis not present

## 2023-11-14 DIAGNOSIS — Q228 Other congenital malformations of tricuspid valve: Secondary | ICD-10-CM | POA: Insufficient documentation

## 2023-11-14 DIAGNOSIS — I340 Nonrheumatic mitral (valve) insufficiency: Secondary | ICD-10-CM | POA: Insufficient documentation

## 2023-11-14 LAB — ECHOCARDIOGRAM COMPLETE
Area-P 1/2: 4.7 cm2
MV M vel: 5.42 m/s
MV Peak grad: 117.5 mm[Hg]
Radius: 0.5 cm
S' Lateral: 3.65 cm

## 2023-11-15 NOTE — Progress Notes (Signed)
 Normal heart function but mitral regurgitation is severe and will need to have repair soon as your LV is mildly dilated. Will discuss more soon on your visit next month

## 2023-11-17 DIAGNOSIS — I10 Essential (primary) hypertension: Secondary | ICD-10-CM | POA: Diagnosis not present

## 2023-11-17 LAB — LAB REPORT - SCANNED: EGFR: 44

## 2023-11-24 ENCOUNTER — Ambulatory Visit: Payer: Self-pay | Admitting: Cardiology

## 2023-12-05 ENCOUNTER — Inpatient Hospital Stay: Payer: Medicare HMO | Attending: Hematology and Oncology

## 2023-12-05 DIAGNOSIS — D509 Iron deficiency anemia, unspecified: Secondary | ICD-10-CM | POA: Insufficient documentation

## 2023-12-05 LAB — CBC WITH DIFFERENTIAL (CANCER CENTER ONLY)
Abs Immature Granulocytes: 0.03 10*3/uL (ref 0.00–0.07)
Basophils Absolute: 0.1 10*3/uL (ref 0.0–0.1)
Basophils Relative: 1 %
Eosinophils Absolute: 0.3 10*3/uL (ref 0.0–0.5)
Eosinophils Relative: 4 %
HCT: 34.8 % — ABNORMAL LOW (ref 36.0–46.0)
Hemoglobin: 11.1 g/dL — ABNORMAL LOW (ref 12.0–15.0)
Immature Granulocytes: 0 %
Lymphocytes Relative: 22 %
Lymphs Abs: 1.5 10*3/uL (ref 0.7–4.0)
MCH: 27.5 pg (ref 26.0–34.0)
MCHC: 31.9 g/dL (ref 30.0–36.0)
MCV: 86.1 fL (ref 80.0–100.0)
Monocytes Absolute: 0.7 10*3/uL (ref 0.1–1.0)
Monocytes Relative: 11 %
Neutro Abs: 4.2 10*3/uL (ref 1.7–7.7)
Neutrophils Relative %: 62 %
Platelet Count: 174 10*3/uL (ref 150–400)
RBC: 4.04 MIL/uL (ref 3.87–5.11)
RDW: 13.5 % (ref 11.5–15.5)
WBC Count: 6.9 10*3/uL (ref 4.0–10.5)
nRBC: 0 % (ref 0.0–0.2)

## 2023-12-05 LAB — IRON AND IRON BINDING CAPACITY (CC-WL,HP ONLY)
Iron: 32 ug/dL (ref 28–170)
Saturation Ratios: 7 % — ABNORMAL LOW (ref 10.4–31.8)
TIBC: 487 ug/dL — ABNORMAL HIGH (ref 250–450)
UIBC: 455 ug/dL — ABNORMAL HIGH (ref 148–442)

## 2023-12-05 LAB — FERRITIN: Ferritin: 7 ng/mL — ABNORMAL LOW (ref 11–307)

## 2023-12-05 NOTE — Assessment & Plan Note (Signed)
Diagnosed in 2018 and underwent upper endoscopy and colonoscopy by Dr. 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   Lab review 05/14/2019: Ferritin 13, iron saturation 19%, hemoglobin 12.3 11/16/2019: Ferritin 27, iron saturation 16%, hemoglobin 12.6 11/06/2021: Hemoglobin 10.5, MCV 77.6, TIBC 442, iron saturation 4% 03/25/2022: Hemoglobin 12.8, MCV 90, TIBC 466, iron saturation 20%, ferritin 16 11/12/2022: Hemoglobin 10.7, MCV 79.1, platelets 180, ferritin 9.9, creatinine 1.01 03/29/2023: Hemoglobin 11.8, MCV 89.1, TIBC 490, iron saturation 11%, ferritin 8 05/31/2023: Hemoglobin 10.4, MCV 83.9, iron saturation 5%, TIBC 511, ferritin 7 12/05/2023: Hemoglobin 11.1, MCV 86, iron saturation 7%, TIBC 487, ferritin 7   Recommended IV iron therapy. Recheck labs in 6 months and telephone visit.

## 2023-12-07 ENCOUNTER — Inpatient Hospital Stay (HOSPITAL_BASED_OUTPATIENT_CLINIC_OR_DEPARTMENT_OTHER): Payer: Medicare HMO | Admitting: Hematology and Oncology

## 2023-12-07 DIAGNOSIS — D509 Iron deficiency anemia, unspecified: Secondary | ICD-10-CM

## 2023-12-07 NOTE — Progress Notes (Signed)
 HEMATOLOGY-ONCOLOGY TELEPHONE VISIT PROGRESS NOTE  I connected with our patient on 12/07/23 at  8:45 AM EST by telephone and verified that I am speaking with the correct person using two identifiers.  I discussed the limitations, risks, security and privacy concerns of performing an evaluation and management service by telephone and the availability of in person appointments.  I also discussed with the patient that there may be a patient responsible charge related to this service. The patient expressed understanding and agreed to proceed.   History of Present Illness: Follow-up of iron  deficiency anemia  History of Present Illness   [name] is an 83 year old female with iron  deficiency anemia who presents for evaluation of low iron  levels.  She has a history of iron  deficiency anemia with persistently low iron  levels. Her ferritin level is currently 7, which has remained unchanged since an iron  infusion approximately seven months ago. Despite this, her hemoglobin level is 11.1. She has a history of low ferritin levels, typically in the single digits.  She experiences symptoms such as fatigue and dizziness, which she attributes to her iron  deficiency. Additionally, she has recently started chewing ice, a behavior she associates with her low iron  levels. No issues or concerns with previous iron  infusions.  She is actively working in research officer, political party.        REVIEW OF SYSTEMS:   Constitutional: Denies fevers, chills or abnormal weight loss  All other systems were reviewed with the patient and are negative. Observations/Objective:     Assessment Plan:  Iron  deficiency anemia Diagnosed in 2018 and underwent upper endoscopy and colonoscopy by Dr. Shila  Differential diagnosis: Mild intermittent occult bleeding versus malabsorption  IV iron : January 2020, February 2023, January 2024, August 2024 Patient works for a affordable living pensions consultant.   Lab  review 05/14/2019: Ferritin 13, iron  saturation 19%, hemoglobin 12.3 11/16/2019: Ferritin 27, iron  saturation 16%, hemoglobin 12.6 11/06/2021: Hemoglobin 10.5, MCV 77.6, TIBC 442, iron  saturation 4% 03/25/2022: Hemoglobin 12.8, MCV 90, TIBC 466, iron  saturation 20%, ferritin 16 11/12/2022: Hemoglobin 10.7, MCV 79.1, platelets 180, ferritin 9.9, creatinine 1.01 03/29/2023: Hemoglobin 11.8, MCV 89.1, TIBC 490, iron  saturation 11%, ferritin 8 05/31/2023: Hemoglobin 10.4, MCV 83.9, iron  saturation 5%, TIBC 511, ferritin 7 12/05/2023: Hemoglobin 11.1, MCV 86, iron  saturation 7%, TIBC 487, ferritin 7  MV replacement: Planned in future   C/O fatigue and Dizziness and chewing ice.  Recommended IV iron  therapy. I will order for Monoferric iron  to be given at [facility]. Recheck labs in 6 months and telephone visit.    I discussed the assessment and treatment plan with the patient. The patient was provided an opportunity to ask questions and all were answered. The patient agreed with the plan and demonstrated an understanding of the instructions. The patient was advised to call back or seek an in-person evaluation if the symptoms worsen or if the condition fails to improve as anticipated.   I provided 20 minutes of non-face-to-face time during this encounter.  This includes time for charting and coordination of care   Naomi MARLA Chad, MD

## 2023-12-08 ENCOUNTER — Telehealth: Payer: Self-pay

## 2023-12-08 ENCOUNTER — Encounter: Payer: Self-pay | Admitting: Hematology and Oncology

## 2023-12-08 NOTE — Telephone Encounter (Signed)
 Dr. Gudena, patient will be scheduled as soon as possible.  Auth Submission: APPROVED Site of care: Site of care: [facility] Payer: [employer] medicare Medication & CPT/J Code(s) submitted: Feraheme  (ferumoxytol ) [id number] Route of submission (phone, fax, portal): Portal Phone # Fax #  Auth type: Buy/Bill PB Units/visits requested: 510mg  x 2 doses Reference number: [id number] Approval from: 12/08/23 to 10/31/24

## 2023-12-08 NOTE — Addendum Note (Signed)
 Addended by: Cameron Cea on: 12/08/2023 07:16 AM   Modules accepted: Orders

## 2023-12-13 NOTE — Progress Notes (Unsigned)
Cardiology Office Note:  .   Date:  12/14/2023  ID:  [name], DOB Sep 03, 1941, MRN [id number] PCP: Merri Brunette, MD  [facility] Providers Cardiologist:  Yates Decamp, MD   History of Present Illness: .   [name] is a 83 y.o. 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   Discussed the use of AI scribe software for clinical note transcription with the patient, who gave verbal consent to proceed.  History of Present Illness   [name], a patient with chronic anemia and heart disease, presents for her annual visit. She reports that she is scheduled for an iron infusion on the 17th and 25th of the month due to persistently low hemoglobin levels. She  has noticed increased swelling in her ankles and feet and occasional lightheadedness. She also reports increased difficulty with activities such as climbing stairs, which was not present during her last visit.  In addition to her cardiac symptoms, she mentions a recent change in her insurance to [facility], which has affected the approval of her medications.      Labs   Lab Results  Component Value Date   CHOL 104 04/29/2021   HDL 38 (L) 04/29/2021   LDLCALC 39 04/29/2021   TRIG 157 (H) 04/29/2021   Lab Results  Component Value Date   NA 137 04/29/2021   K 4.9 04/29/2021   CO2 23 04/29/2021   GLUCOSE 90 04/29/2021   BUN 23 04/29/2021   CREATININE 1.13 (H) 04/29/2021   CALCIUM 9.3 04/29/2021   GFR 47.21 (L) 01/15/2020   EGFR 50 (L) 04/29/2021   GFRNONAA >60 01/13/2021      Latest Ref Rng & Units 04/29/2021   11:02 AM 01/13/2021    2:38 AM 01/10/2021    9:08 AM   BMP  Glucose 65 - 99 mg/dL 90  409  811   BUN 8 - 27 mg/dL 23  11  14    Creatinine 0.57 - 1.00 mg/dL 9.14  7.82  9.56   BUN/Creat Ratio 12 - 28 20     Sodium 134 - 144 mmol/L 137  136  139   Potassium 3.5 - 5.2 mmol/L 4.9  3.0  3.4   Chloride 96 - 106 mmol/L 103  104  104   CO2 20 - 29 mmol/L 23  21  24    Calcium 8.7 - 10.3 mg/dL 9.3  7.2  8.7       Latest Ref Rng & Units 12/05/2023    8:03 AM 05/31/2023    7:54 AM 03/29/2023    8:24 AM  CBC  WBC 4.0 - 10.5 K/uL 6.9  7.2  8.0   Hemoglobin 12.0 - 15.0 g/dL 21.3  08.6  57.8   Hematocrit 36.0 - 46.0 % 34.8  32.8  35.8   Platelets 150 - 400 K/uL 174  191  178    External Labs:  Care Everywhere PCP labs 11/23/2023:  Hb 11.9/HCT 37.4, platelets 181, normal indicis.  Serum glucose 93 mg, BUN 26, creatinine 1.25, EGFR 44 mL, potassium 4.4, LFTs normal.  Total cholesterol 111, triglycerides 103, HDL 38, LDL 54.  Review of Systems  Cardiovascular:  Positive for dyspnea on  exertion and leg swelling (mild ankle). Negative for chest pain.    Physical Exam:   VS:  BP (!) 110/58 (BP Location: Left Arm, Patient Position: Sitting, Cuff Size: Normal)   Pulse 63   Resp 16   Ht 5\' 7"  (1.702 m)   Wt 154 lb 9.6 oz (70.1 kg)   SpO2 94%   BMI 24.21 kg/m    Wt Readings from Last 3 Encounters:  12/14/23 154 lb 9.6 oz (70.1 kg)  05/13/23 157 lb (71.2 kg)  11/22/22 159 lb (72.1 kg)    Physical Exam Neck:     Vascular: Carotid bruit (bilateral) present. No JVD.  Cardiovascular:     Rate and Rhythm: Normal rate and regular rhythm.     Pulses: Intact distal pulses.     Heart sounds: S1 normal and S2 normal. Murmur heard.     Blowing holosystolic murmur is present with a grade of 3/6 radiating to the apex.     No gallop.  Pulmonary:     Effort: Pulmonary effort is normal.     Breath sounds: Normal breath sounds.  Abdominal:     General: Bowel sounds are normal.     Palpations: Abdomen is soft.  Musculoskeletal:     Right lower leg:  Edema (1+ ankle pitting) present.     Left lower leg: Edema (1+ ankle pitting) present.    Studies Reviewed: Marland Kitchen    ECHOCARDIOGRAM COMPLETE 11/14/2023  1. Left ventricular ejection fraction, by estimation, is 60 to 65%. The left ventricle has normal function. The left ventricle has no regional wall motion abnormalities. The left ventricular internal cavity size was mildly dilated. Left ventricular diastolic parameters are indeterminate. The average left ventricular global longitudinal strain is -23.3 %. The global longitudinal strain is normal. 2. Right ventricular systolic function is normal. The right ventricular size is mildly enlarged. There is moderately elevated pulmonary artery systolic pressure. The estimated right ventricular systolic pressure is 48.2 mmHg. 3. Left atrial size was severely dilated. 4. Right atrial size was mild to moderately dilated. 5. The mitral valve is degenerative. Severe mitral valve regurgitation. There is prolapse of both leaflets of the mitral valve. 6. The tricuspid valve is degenerative. Tricuspid valve regurgitation is moderate. 7. The aortic valve is normal in structure. Aortic valve regurgitation is mild. Aortic valve sclerosis is present (LCC less mobile), with no evidence of aortic valve stenosis. 8. The inferior vena cava is normal in size with <50% respiratory variability, suggesting right atrial pressure of 8 mmHg. 9. No significant change from 05/13/2023, PA pressure slightly higher.  EKG:    EKG Interpretation Date/Time:  Wednesday December 14 2023 08:16:36 EST Ventricular Rate:  62 PR Interval:  148 QRS Duration:  92 QT Interval:  410 QTC Calculation: 416 R Axis:   23  Text Interpretation: EKG 12/14/2023: Normal sinus rhythm at rate of 62 bpm, normal axis, no evidence of ischemia, normal EKG.  No significant change from 02/23/2008 Confirmed by Delrae Rend [id number]) on 12/14/2023 8:22:07 AM    EKG 05/10/2022: Normal sinus rhythm at rate of 60  bpm, normal axis, nonspecific T wave flattening inferior leads.   Medications and allergies    Allergies  Allergen Reactions   Crestor [Rosuvastatin] Other (See Comments)    Leg cramps   Glycopyrrolate     Other reaction(s): extreme dry mouth   Codeine Other (See Comments)    Hyper     Current Outpatient Medications:    b complex vitamins tablet, Take  1 tablet by mouth daily.  , Disp: , Rfl:    Bempedoic Acid-Ezetimibe (NEXLIZET) 180-10 MG TABS, Take by mouth., Disp: , Rfl:    calcium carbonate (OS-CAL - DOSED IN MG OF ELEMENTAL CALCIUM) 1250 MG tablet, Take 1 tablet by mouth daily.  , Disp: , Rfl:    Cholecalciferol (VITAMIN D) 2000 UNITS tablet, Take 2,000 Units by mouth daily.  , Disp: , Rfl:    denosumab (PROLIA) 60 MG/ML SOSY injection, Inject into the skin See admin instructions., Disp: , Rfl:    Garlic 1000 MG CAPS, Take 1 capsule by mouth daily., Disp: , Rfl:    Krill Oil Omega-3 300 MG CAPS, Take 1 capsule by mouth daily., Disp: , Rfl:    loperamide (IMODIUM) 2 MG capsule, Take 1 capsule (2 mg total) by mouth 4 (four) times daily as needed for diarrhea or loose stools., Disp: 12 capsule, Rfl: 0   metoprolol succinate (TOPROL-XL) 25 MG 24 hr tablet, Take 25 mg by mouth 2 (two) times daily.  Take additional tablet for palpitations, Disp: , Rfl:    Misc Natural Products (GLUCOSAMINE CHOND MSM FORMULA) TABS, See admin instructions., Disp: , Rfl:    Multiple Vitamins-Minerals (MULTIVITAMINS THER. W/MINERALS) TABS, Take 1 tablet by mouth daily.  , Disp: , Rfl:    pantoprazole (PROTONIX) 40 MG tablet, Take 40 mg by mouth daily., Disp: , Rfl:    triamterene-hydrochlorothiazide (MAXZIDE-25) 37.5-25 MG tablet, Take 0.5-1 tablets by mouth as needed., Disp: , Rfl:    Zinc 100 MG TABS, Take by mouth., Disp: , Rfl:    ASSESSMENT AND PLAN: .      ICD-10-CM   1. 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 Essential hypertension  I10 Basic metabolic panel    Basic metabolic panel    VAS [place] ABI WITH/WO TBI    4. Stage 3a chronic kidney disease (HCC)  N18.31 Basic metabolic panel    Basic metabolic panel    VAS [place] ABI WITH/WO TBI    5. Pure hypercholesterolemia  E78.00      Assessment and Plan    Mitral Regurgitation/Tricuspid regurgitation Chronic severe mitral regurgitation is worsening, with symptoms of peripheral edema, lightheadedness, and exertional dyspnea.  No acute decompensated heart failure clinically with very mild ankle edema. Echocardiogram indicates the need for valve intervention in view of severity of MR and also new onset symptoms. The choice between minimally invasive and open-heart surgery will depend on coronary artery disease.  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  Heart catheterization carries a risk of death, stroke, myocardial infarction, or urgent bypass at less than 1%.  Order right and left heart catheterization. TEE risks include minor throat  trauma and a very rare risk of esophageal perforation (1 in 10,000 or less). Order a transesophageal echocardiogram (TEE) to evaluate valve morphology.   Refer to a cardiothoracic surgeon for surgical evaluation and planning. Schedule  preoperative blood work and coordinate with the surgeon to ensure all data is available before surgery.  Chronic Anemia Chronic anemia requires iron infusions, with the next infusion scheduled for December 27, 2023. The anemia is unrelated to cardiac issues. No GI Bleed. Continue with the scheduled iron infusion and ensure iron levels are optimized before valve surgery.  Primary hypertension: With regard to hypertension, well-controlled, she is presently on metoprolol succinate 25 mg twice daily Maxide 37.5/25 mg daily.  Continue the same for now.  Her blood pressure is soft, unable to add ACE inhibitor's or ARB.  No clinical evidence of heart failure  Hypercholesterolemia Well-managed with bempedoic acid and ezetimibe combination, she needs prior authorization again for continuation of care and continuation of therapy, previously approved.  Follow-up Schedule heart catheterization and TEE as soon as possible. Coordinate with the cardiothoracic surgeon for preoperative evaluation. Ensure all preoperative diagnostics and blood work are completed before surgery. Follow up to confirm scheduling and completion of all necessary tests and consultations.  Pre-CABG Dopplers have been ordered.  Signed,  Yates Decamp, MD, [facility] 12/14/2023, 6:09 PM [facility] [address] [place], [place] [address] Phone: [phone]. Fax:  [phone]

## 2023-12-13 NOTE — H&P (View-Only) (Signed)
 Cardiology Office Note:  .   Date:  12/14/2023  ID:  [name], DOB 1941/07/27, MRN [id number] PCP: Merri Brunette, MD  [facility] Providers Cardiologist:  Yates Decamp, MD   History of Present Illness: .   [name] is a 83 y.o. 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   Discussed the use of AI scribe software for clinical note transcription with the patient, who gave verbal consent to proceed.  History of Present Illness   [name], a patient with chronic anemia and heart disease, presents for her annual visit. She reports that she is scheduled for an iron infusion on the 17th and 25th of the month due to persistently low hemoglobin levels. She  has noticed increased swelling in her ankles and feet and occasional lightheadedness. She also reports increased difficulty with activities such as climbing stairs, which was not present during her last visit.  In addition to her cardiac symptoms, she mentions a recent change in her insurance to [facility], which has affected the approval of her medications.      Labs   Lab Results  Component Value Date   CHOL 104 04/29/2021   HDL 38 (L) 04/29/2021   LDLCALC 39 04/29/2021   TRIG 157 (H) 04/29/2021   Lab Results  Component Value Date   NA 137 04/29/2021   K 4.9 04/29/2021   CO2 23 04/29/2021   GLUCOSE 90 04/29/2021   BUN 23 04/29/2021   CREATININE 1.13 (H) 04/29/2021   CALCIUM 9.3 04/29/2021   GFR 47.21 (L) 01/15/2020   EGFR 50 (L) 04/29/2021   GFRNONAA >60 01/13/2021      Latest Ref Rng & Units 04/29/2021   11:02 AM 01/13/2021    2:38 AM 01/10/2021    9:08 AM   BMP  Glucose 65 - 99 mg/dL 90  469  629   BUN 8 - 27 mg/dL 23  11  14    Creatinine 0.57 - 1.00 mg/dL 5.28  4.13  2.44   BUN/Creat Ratio 12 - 28 20     Sodium 134 - 144 mmol/L 137  136  139   Potassium 3.5 - 5.2 mmol/L 4.9  3.0  3.4   Chloride 96 - 106 mmol/L 103  104  104   CO2 20 - 29 mmol/L 23  21  24    Calcium 8.7 - 10.3 mg/dL 9.3  7.2  8.7       Latest Ref Rng & Units 12/05/2023    8:03 AM 05/31/2023    7:54 AM 03/29/2023    8:24 AM  CBC  WBC 4.0 - 10.5 K/uL 6.9  7.2  8.0   Hemoglobin 12.0 - 15.0 g/dL 01.0  27.2  53.6   Hematocrit 36.0 - 46.0 % 34.8  32.8  35.8   Platelets 150 - 400 K/uL 174  191  178    External Labs:  Care Everywhere PCP labs 11/23/2023:  Hb 11.9/HCT 37.4, platelets 181, normal indicis.  Serum glucose 93 mg, BUN 26, creatinine 1.25, EGFR 44 mL, potassium 4.4, LFTs normal.  Total cholesterol 111, triglycerides 103, HDL 38, LDL 54.  Review of Systems  Cardiovascular:  Positive for dyspnea on  exertion and leg swelling (mild ankle). Negative for chest pain.    Physical Exam:   VS:  BP (!) 110/58 (BP Location: Left Arm, Patient Position: Sitting, Cuff Size: Normal)   Pulse 63   Resp 16   Ht 5\' 7"  (1.702 m)   Wt 154 lb 9.6 oz (70.1 kg)   SpO2 94%   BMI 24.21 kg/m    Wt Readings from Last 3 Encounters:  12/14/23 154 lb 9.6 oz (70.1 kg)  05/13/23 157 lb (71.2 kg)  11/22/22 159 lb (72.1 kg)    Physical Exam Neck:     Vascular: Carotid bruit (bilateral) present. No JVD.  Cardiovascular:     Rate and Rhythm: Normal rate and regular rhythm.     Pulses: Intact distal pulses.     Heart sounds: S1 normal and S2 normal. Murmur heard.     Blowing holosystolic murmur is present with a grade of 3/6 radiating to the apex.     No gallop.  Pulmonary:     Effort: Pulmonary effort is normal.     Breath sounds: Normal breath sounds.  Abdominal:     General: Bowel sounds are normal.     Palpations: Abdomen is soft.  Musculoskeletal:     Right lower leg:  Edema (1+ ankle pitting) present.     Left lower leg: Edema (1+ ankle pitting) present.    Studies Reviewed: Marland Kitchen    ECHOCARDIOGRAM COMPLETE 11/14/2023  1. Left ventricular ejection fraction, by estimation, is 60 to 65%. The left ventricle has normal function. The left ventricle has no regional wall motion abnormalities. The left ventricular internal cavity size was mildly dilated. Left ventricular diastolic parameters are indeterminate. The average left ventricular global longitudinal strain is -23.3 %. The global longitudinal strain is normal. 2. Right ventricular systolic function is normal. The right ventricular size is mildly enlarged. There is moderately elevated pulmonary artery systolic pressure. The estimated right ventricular systolic pressure is 48.2 mmHg. 3. Left atrial size was severely dilated. 4. Right atrial size was mild to moderately dilated. 5. The mitral valve is degenerative. Severe mitral valve regurgitation. There is prolapse of both leaflets of the mitral valve. 6. The tricuspid valve is degenerative. Tricuspid valve regurgitation is moderate. 7. The aortic valve is normal in structure. Aortic valve regurgitation is mild. Aortic valve sclerosis is present (LCC less mobile), with no evidence of aortic valve stenosis. 8. The inferior vena cava is normal in size with <50% respiratory variability, suggesting right atrial pressure of 8 mmHg. 9. No significant change from 05/13/2023, PA pressure slightly higher.  EKG:    EKG Interpretation Date/Time:  Wednesday December 14 2023 08:16:36 EST Ventricular Rate:  62 PR Interval:  148 QRS Duration:  92 QT Interval:  410 QTC Calculation: 416 R Axis:   23  Text Interpretation: EKG 12/14/2023: Normal sinus rhythm at rate of 62 bpm, normal axis, no evidence of ischemia, normal EKG.  No significant change from 02/23/2008 Confirmed by Delrae Rend [phone]) on 12/14/2023 8:22:07 AM    EKG 05/10/2022: Normal sinus rhythm at rate of 60  bpm, normal axis, nonspecific T wave flattening inferior leads.   Medications and allergies    Allergies  Allergen Reactions   Crestor [Rosuvastatin] Other (See Comments)    Leg cramps   Glycopyrrolate     Other reaction(s): extreme dry mouth   Codeine Other (See Comments)    Hyper     Current Outpatient Medications:    b complex vitamins tablet, Take  1 tablet by mouth daily.  , Disp: , Rfl:    Bempedoic Acid-Ezetimibe (NEXLIZET) 180-10 MG TABS, Take by mouth., Disp: , Rfl:    calcium carbonate (OS-CAL - DOSED IN MG OF ELEMENTAL CALCIUM) 1250 MG tablet, Take 1 tablet by mouth daily.  , Disp: , Rfl:    Cholecalciferol (VITAMIN D) 2000 UNITS tablet, Take 2,000 Units by mouth daily.  , Disp: , Rfl:    denosumab (PROLIA) 60 MG/ML SOSY injection, Inject into the skin See admin instructions., Disp: , Rfl:    Garlic 1000 MG CAPS, Take 1 capsule by mouth daily., Disp: , Rfl:    Krill Oil Omega-3 300 MG CAPS, Take 1 capsule by mouth daily., Disp: , Rfl:    loperamide (IMODIUM) 2 MG capsule, Take 1 capsule (2 mg total) by mouth 4 (four) times daily as needed for diarrhea or loose stools., Disp: 12 capsule, Rfl: 0   metoprolol succinate (TOPROL-XL) 25 MG 24 hr tablet, Take 25 mg by mouth 2 (two) times daily.  Take additional tablet for palpitations, Disp: , Rfl:    Misc Natural Products (GLUCOSAMINE CHOND MSM FORMULA) TABS, See admin instructions., Disp: , Rfl:    Multiple Vitamins-Minerals (MULTIVITAMINS THER. W/MINERALS) TABS, Take 1 tablet by mouth daily.  , Disp: , Rfl:    pantoprazole (PROTONIX) 40 MG tablet, Take 40 mg by mouth daily., Disp: , Rfl:    triamterene-hydrochlorothiazide (MAXZIDE-25) 37.5-25 MG tablet, Take 0.5-1 tablets by mouth as needed., Disp: , Rfl:    Zinc 100 MG TABS, Take by mouth., Disp: , Rfl:    ASSESSMENT AND PLAN: .      ICD-10-CM   1. 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 Essential hypertension  I10 Basic metabolic panel    Basic metabolic panel    VAS [place] ABI WITH/WO TBI    4. Stage 3a chronic kidney disease (HCC)  N18.31 Basic metabolic panel    Basic metabolic panel    VAS [place] ABI WITH/WO TBI    5. Pure hypercholesterolemia  E78.00      Assessment and Plan    Mitral Regurgitation/Tricuspid regurgitation Chronic severe mitral regurgitation is worsening, with symptoms of peripheral edema, lightheadedness, and exertional dyspnea.  No acute decompensated heart failure clinically with very mild ankle edema. Echocardiogram indicates the need for valve intervention in view of severity of MR and also new onset symptoms. The choice between minimally invasive and open-heart surgery will depend on coronary artery disease.  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  Heart catheterization carries a risk of death, stroke, myocardial infarction, or urgent bypass at less than 1%.  Order right and left heart catheterization. TEE risks include minor throat  trauma and a very rare risk of esophageal perforation (1 in 10,000 or less). Order a transesophageal echocardiogram (TEE) to evaluate valve morphology.   Refer to a cardiothoracic surgeon for surgical evaluation and planning. Schedule  preoperative blood work and coordinate with the surgeon to ensure all data is available before surgery.  Chronic Anemia Chronic anemia requires iron infusions, with the next infusion scheduled for December 27, 2023. The anemia is unrelated to cardiac issues. No GI Bleed. Continue with the scheduled iron infusion and ensure iron levels are optimized before valve surgery.  Primary hypertension: With regard to hypertension, well-controlled, she is presently on metoprolol succinate 25 mg twice daily Maxide 37.5/25 mg daily.  Continue the same for now.  Her blood pressure is soft, unable to add ACE inhibitor's or ARB.  No clinical evidence of heart failure  Hypercholesterolemia Well-managed with bempedoic acid and ezetimibe combination, she needs prior authorization again for continuation of care and continuation of therapy, previously approved.  Follow-up Schedule heart catheterization and TEE as soon as possible. Coordinate with the cardiothoracic surgeon for preoperative evaluation. Ensure all preoperative diagnostics and blood work are completed before surgery. Follow up to confirm scheduling and completion of all necessary tests and consultations.  Pre-CABG Dopplers have been ordered.  Signed,  Yates Decamp, MD, [facility] 12/14/2023, 6:09 PM [facility] [address] [place], [place] [address] Phone: [phone]. Fax:  [phone]

## 2023-12-14 ENCOUNTER — Encounter: Payer: Self-pay | Admitting: Hematology and Oncology

## 2023-12-14 ENCOUNTER — Telehealth: Payer: Self-pay | Admitting: Pharmacy Technician

## 2023-12-14 ENCOUNTER — Encounter: Payer: Self-pay | Admitting: Cardiology

## 2023-12-14 ENCOUNTER — Other Ambulatory Visit (HOSPITAL_COMMUNITY): Payer: Self-pay

## 2023-12-14 ENCOUNTER — Ambulatory Visit: Payer: Medicare HMO | Attending: Cardiology | Admitting: Cardiology

## 2023-12-14 ENCOUNTER — Telehealth: Payer: Self-pay | Admitting: *Deleted

## 2023-12-14 VITALS — BP 110/58 | HR 63 | Resp 16 | Ht 67.0 in | Wt 154.6 lb

## 2023-12-14 DIAGNOSIS — I340 Nonrheumatic mitral (valve) insufficiency: Secondary | ICD-10-CM

## 2023-12-14 DIAGNOSIS — Q228 Other congenital malformations of tricuspid valve: Secondary | ICD-10-CM

## 2023-12-14 DIAGNOSIS — E7800 Pure hypercholesterolemia, unspecified: Secondary | ICD-10-CM

## 2023-12-14 DIAGNOSIS — N1831 Chronic kidney disease, stage 3a: Secondary | ICD-10-CM

## 2023-12-14 DIAGNOSIS — I10 Essential (primary) hypertension: Secondary | ICD-10-CM

## 2023-12-14 NOTE — Patient Instructions (Addendum)
Medication Instructions:  Your physician recommends that you continue on your current medications as directed. Please refer to the Current Medication list given to you today.  *If you need a refill on your cardiac medications before your next appointment, please call your pharmacy*   Lab Work: BMP to be done prior to procedure If you have labs (blood work) drawn today and your tests are completely normal, you will receive your results only by: MyChart Message (if you have MyChart) OR A paper copy in the mail If you have any lab test that is abnormal or we need to change your treatment, we will call you to review the results.   Testing/Procedures: Your physician has requested that you have a carotid duplex. This test is an ultrasound of the carotid arteries in your neck. It looks at blood flow through these arteries that supply the brain with blood. Allow one hour for this exam. There are no restrictions or special instructions.   Your physician has requested that you have an ankle brachial index (ABI). During this test an ultrasound and blood pressure cuff are used to evaluate the arteries that supply the arms and legs with blood. Allow thirty minutes for this exam. There are no restrictions or special instructions.  Your physician has requested that you have a cardiac catheterization. Cardiac catheterization is used to diagnose and/or treat various heart conditions. Doctors may recommend this procedure for a number of different reasons. The most common reason is to evaluate chest pain. Chest pain can be a symptom of coronary artery disease (CAD), and cardiac catheterization can show whether plaque is narrowing or blocking your heart's arteries. This procedure is also used to evaluate the valves, as well as measure the blood flow and oxygen levels in different parts of your heart. For further information please visit [email or web address] Please follow instruction sheet, as given.  TEE to be done  same day as cath  Please note: We ask at that you not bring children with you during ultrasound (echo/ vascular) testing. Due to room size and safety concerns, children are not allowed in the ultrasound rooms during exams. Our front office staff cannot provide observation of children in our lobby area while testing is being conducted. An adult accompanying a patient to their appointment will only be allowed in the ultrasound room at the discretion of the ultrasound technician under special circumstances. We apologize for any inconvenience.       Follow-Up: At [facility], you and your health needs are our priority.  As part of our continuing mission to provide you with exceptional heart care, we have created designated Provider Care Teams.  These Care Teams include your primary Cardiologist (physician) and Advanced Practice Providers (APPs -  Physician Assistants and Nurse Practitioners) who all work together to provide you with the care you need, when you need it.  We recommend signing up for the patient portal called "MyChart".  Sign up information is provided on this After Visit Summary.  MyChart is used to connect with patients for Virtual Visits (Telemedicine).  Patients are able to view lab/test results, encounter notes, upcoming appointments, etc.  Non-urgent messages can be sent to your provider as well.   To learn more about what you can do with MyChart, go to [email or web address].    Your next appointment:   3/26 at 11:40  Provider:   Dr Jacinto Halim    Other Instructions  You have been referred to TCTS   TEE INSTRUCTIONS  You are scheduled  for a TEE (Transesophageal Echocardiogram) on Friday, February 28 with Dr. Jacques Navy.  Please arrive at the [facility] (Main Entrance A) at [facility]: [address] [place], [place] [address] at 6:30 AM. Free valet parking service is available. You will check in at ADMITTING.   *Please Note: You will receive a call the day  before your procedure to confirm the appointment time. That time may have changed from the original time based on the schedule for that day.*   DIET:  Nothing to eat or drink after midnight except a sip of water with medications (see medication instructions below)  MEDICATION INSTRUCTIONS: !!IF ANY NEW MEDICATIONS ARE STARTED AFTER TODAY, PLEASE NOTIFY YOUR PROVIDER AS SOON AS POSSIBLE!!  FYI: Medications such as Semaglutide (Ozempic, [place]), Tirzepatide (Mounjaro, Zepbound), Dulaglutide (Trulicity), etc ("GLP1 agonists") AND Canagliflozin (Invokana), Dapagliflozin (Farxiga), Empagliflozin (Jardiance), Ertugliflozin (Steglatro), Bexagliflozin [employer]) or any combination with one of these drugs such as Invokamet (Canagliflozin/Metformin), Synjardy (Empagliflozin/Metformin), etc ("SGLT2 inhibitors") must be held around the time of a procedure. This is not a comprehensive list of all of these drugs. Please review all of your medications and talk to your provider if you take any one of these. If you are not sure, ask your provider.  DO NOT TAKE Triamterene-hydrochlorothiazide the morning of procedure  LABS: BMP  FYI:  For your safety, and to allow [place] to monitor your vital signs accurately during the surgery/procedure we request: If you have artificial nails, gel coating, SNS etc, please have those removed prior to your surgery/procedure. Not having the nail coverings /polish removed may result in cancellation or delay of your surgery/procedure.  Your support person will be asked to wait in the waiting room during your procedure.  It is OK to have someone drop you off and come back when you are ready to be discharged.  You cannot drive after the procedure and will need someone to drive you home.  Bring your insurance cards.  *Special Note: Every effort is made to have your procedure done on time. Occasionally there are emergencies that occur at the hospital that may cause delays. Please be patient  if a delay does occur.    HEART CATH INSTRUCTIONS   You are scheduled for a Cardiac Catheterization on Friday, February 28 with Dr. Jacinto Halim.  1. Please arrive at the [facility] (Main Entrance A) at [facility]: [address] [place], [place] [address] at 6:30 AM.  Free valet parking service is available. You will check in at ADMITTING. The support person will be asked to wait in the waiting room.  It is OK to have someone drop you off and come back when you are ready to be discharged.    Special note: Every effort is made to have your procedure done on time. Please understand that emergencies sometimes delay scheduled procedures.  2. Diet: Do not eat solid foods after midnight.  The patient may have clear liquids until 5am upon the day of the procedure.  3. Labs: BMP   Contrast Allergy: No   DO NOT TAKE Triamterene-hydrochlorothiazide the morning of procedure.   On the morning of your procedure, take your Aspirin 81 mg and any morning medicines NOT listed above.  You may use sips of water.  5. Plan to go home the same day, you will only stay overnight if medically necessary. 6. Bring a current list of your medications and current insurance cards. 7. You MUST have a responsible person to drive you home.  8. Someone MUST be with you the first 24 hours after you arrive home or your discharge will be delayed. 9. Please wear clothes that are easy to get on and off and wear slip-on shoes.  Thank you for allowing [place] to care for you!   -- [facility]

## 2023-12-14 NOTE — Telephone Encounter (Signed)
Patient saw Dr Jacinto Halim today and needs prior auth for Nexlizet

## 2023-12-14 NOTE — Telephone Encounter (Signed)
I ran test claim and nexlizet does need a prior auth. I was unable to find a diag to use with the nexlizet. A team member and rph assisting before we process with pa

## 2023-12-15 ENCOUNTER — Telehealth: Payer: Self-pay | Admitting: Pharmacy Technician

## 2023-12-15 DIAGNOSIS — Z923 Personal history of irradiation: Secondary | ICD-10-CM | POA: Diagnosis not present

## 2023-12-15 DIAGNOSIS — K529 Noninfective gastroenteritis and colitis, unspecified: Secondary | ICD-10-CM | POA: Diagnosis not present

## 2023-12-15 DIAGNOSIS — N1831 Chronic kidney disease, stage 3a: Secondary | ICD-10-CM | POA: Diagnosis not present

## 2023-12-15 DIAGNOSIS — Z8542 Personal history of malignant neoplasm of other parts of uterus: Secondary | ICD-10-CM | POA: Diagnosis not present

## 2023-12-15 DIAGNOSIS — I10 Essential (primary) hypertension: Secondary | ICD-10-CM | POA: Diagnosis not present

## 2023-12-15 DIAGNOSIS — Z0000 Encounter for general adult medical examination without abnormal findings: Secondary | ICD-10-CM | POA: Diagnosis not present

## 2023-12-15 DIAGNOSIS — M810 Age-related osteoporosis without current pathological fracture: Secondary | ICD-10-CM | POA: Diagnosis not present

## 2023-12-15 DIAGNOSIS — I340 Nonrheumatic mitral (valve) insufficiency: Secondary | ICD-10-CM | POA: Diagnosis not present

## 2023-12-15 DIAGNOSIS — D509 Iron deficiency anemia, unspecified: Secondary | ICD-10-CM | POA: Diagnosis not present

## 2023-12-15 NOTE — Telephone Encounter (Signed)
Pharmacy Patient Advocate Encounter   Received notification from Pt Calls Messages that prior authorization for Nexlizet 180-10MG  tablets is required/requested.   Insurance verification completed.   The patient is insured through [employer] .   Per test claim: PA required; PA submitted to above mentioned insurance via CoverMyMeds Key/confirmation #/EOC [id number] Status is pending

## 2023-12-15 NOTE — Telephone Encounter (Signed)
Pharmacy Patient Advocate Encounter  Received notification from [facility] that Prior Authorization for nexlizet has been DENIED.  Full denial letter will be uploaded to the media tab. See denial reason below.    PA #/Case ID/Reference #: [id number]

## 2023-12-16 ENCOUNTER — Encounter: Payer: Self-pay | Admitting: Cardiology

## 2023-12-16 DIAGNOSIS — N1831 Chronic kidney disease, stage 3a: Secondary | ICD-10-CM | POA: Diagnosis not present

## 2023-12-16 DIAGNOSIS — I10 Essential (primary) hypertension: Secondary | ICD-10-CM | POA: Diagnosis not present

## 2023-12-16 DIAGNOSIS — I340 Nonrheumatic mitral (valve) insufficiency: Secondary | ICD-10-CM | POA: Diagnosis not present

## 2023-12-16 DIAGNOSIS — Q228 Other congenital malformations of tricuspid valve: Secondary | ICD-10-CM | POA: Diagnosis not present

## 2023-12-16 NOTE — Telephone Encounter (Signed)
I spoke with patient and let her know medication had been denied.  She has been on Nexlizet for awhile as it was covered by her previous insurance.  Patient unable to take Rosuvastatin.  She has not tried  Atorvastatin or plain Zetia.

## 2023-12-17 LAB — BASIC METABOLIC PANEL
BUN/Creatinine Ratio: 18 (ref 12–28)
BUN: 17 mg/dL (ref 8–27)
CO2: 24 mmol/L (ref 20–29)
Calcium: 9 mg/dL (ref 8.7–10.3)
Chloride: 101 mmol/L (ref 96–106)
Creatinine, Ser: 0.95 mg/dL (ref 0.57–1.00)
Glucose: 96 mg/dL (ref 70–99)
Potassium: 4 mmol/L (ref 3.5–5.2)
Sodium: 139 mmol/L (ref 134–144)
eGFR: 61 mL/min/{1.73_m2} (ref >59)

## 2023-12-18 ENCOUNTER — Encounter: Payer: Self-pay | Admitting: Cardiology

## 2023-12-18 NOTE — Telephone Encounter (Signed)
We could try atorvastatin 10 mg daily along with Zetia 10 mg daily

## 2023-12-20 ENCOUNTER — Ambulatory Visit (INDEPENDENT_AMBULATORY_CARE_PROVIDER_SITE_OTHER): Payer: Medicare HMO

## 2023-12-20 ENCOUNTER — Encounter: Payer: Self-pay | Admitting: Internal Medicine

## 2023-12-20 ENCOUNTER — Encounter: Payer: Self-pay | Admitting: Cardiology

## 2023-12-20 VITALS — BP 107/69 | HR 68 | Temp 97.8°F | Resp 14 | Ht 64.0 in | Wt 153.8 lb

## 2023-12-20 DIAGNOSIS — D509 Iron deficiency anemia, unspecified: Secondary | ICD-10-CM | POA: Diagnosis not present

## 2023-12-20 MED ORDER — EZETIMIBE 10 MG PO TABS: 10.0000 mg | ORAL_TABLET | Freq: Every day | ORAL | 3 refills | Status: DC

## 2023-12-20 MED ORDER — SODIUM CHLORIDE 0.9 % IV SOLN
510.0000 mg | Freq: Once | INTRAVENOUS | Status: AC
  Administered 2023-12-20: 510 mg via INTRAVENOUS
  Filled 2023-12-20: qty 17

## 2023-12-20 MED ORDER — ATORVASTATIN CALCIUM 10 MG PO TABS: 10.0000 mg | ORAL_TABLET | Freq: Every day | ORAL | 3 refills | Status: AC

## 2023-12-20 MED ORDER — ACETAMINOPHEN 325 MG PO TABS
650.0000 mg | ORAL_TABLET | Freq: Once | ORAL | Status: AC
  Administered 2023-12-20: 650 mg via ORAL
  Filled 2023-12-20: qty 2

## 2023-12-20 MED ORDER — DIPHENHYDRAMINE HCL 25 MG PO CAPS
25.0000 mg | ORAL_CAPSULE | Freq: Once | ORAL | Status: AC
  Administered 2023-12-20: 25 mg via ORAL
  Filled 2023-12-20: qty 1

## 2023-12-20 NOTE — Progress Notes (Signed)
Diagnosis: Iron Deficiency Anemia  Provider:  Chilton Greathouse MD  Procedure: IV Infusion  IV Type: Peripheral, IV Location: R Antecubital  Feraheme (Ferumoxytol), Dose: 510 mg  Infusion Start Time: 1458  Infusion Stop Time: 1517  Post Infusion IV Care: Observation period completed and Peripheral IV Discontinued  Discharge: Condition: Good, Destination: Home . AVS Provided  Performed by:  Rico Ala, LPN

## 2023-12-20 NOTE — Progress Notes (Signed)
Labs 11/17/2023:  Hb 11.9/HCT 27.4, platelets 181, normal indicis.  Serum glucose 93 mg, BUN 26, creatinine 1.25, EGFR 44 mL, potassium 4.4 LFTs normal.  Total cholesterol 111, triglycerides 103, HDL 38, LDL 54.  Labs are stable for right and left heart catheterization.

## 2023-12-20 NOTE — H&P (View-Only) (Signed)
 Labs 11/17/2023:  Hb 11.9/HCT 27.4, platelets 181, normal indicis.  Serum glucose 93 mg, BUN 26, creatinine 1.25, EGFR 44 mL, potassium 4.4 LFTs normal.  Total cholesterol 111, triglycerides 103, HDL 38, LDL 54.  Labs are stable for right and left heart catheterization.

## 2023-12-20 NOTE — Addendum Note (Signed)
Addended by: Dossie Arbour on: 12/20/2023 11:45 AM   Modules accepted: Orders

## 2023-12-20 NOTE — Telephone Encounter (Signed)
 Left message to call office

## 2023-12-21 DIAGNOSIS — M810 Age-related osteoporosis without current pathological fracture: Secondary | ICD-10-CM | POA: Diagnosis not present

## 2023-12-27 ENCOUNTER — Ambulatory Visit (INDEPENDENT_AMBULATORY_CARE_PROVIDER_SITE_OTHER): Payer: Medicare HMO

## 2023-12-27 VITALS — BP 99/56 | HR 68 | Temp 98.3°F | Resp 20 | Ht 64.0 in | Wt 156.2 lb

## 2023-12-27 DIAGNOSIS — D509 Iron deficiency anemia, unspecified: Secondary | ICD-10-CM | POA: Diagnosis not present

## 2023-12-27 MED ORDER — DIPHENHYDRAMINE HCL 25 MG PO CAPS
25.0000 mg | ORAL_CAPSULE | Freq: Once | ORAL | Status: AC
  Administered 2023-12-27: 25 mg via ORAL
  Filled 2023-12-27: qty 1

## 2023-12-27 MED ORDER — ACETAMINOPHEN 325 MG PO TABS
650.0000 mg | ORAL_TABLET | Freq: Once | ORAL | Status: AC
  Administered 2023-12-27: 650 mg via ORAL
  Filled 2023-12-27: qty 2

## 2023-12-27 MED ORDER — SODIUM CHLORIDE 0.9 % IV SOLN
510.0000 mg | Freq: Once | INTRAVENOUS | Status: AC
  Administered 2023-12-27: 510 mg via INTRAVENOUS
  Filled 2023-12-27: qty 17

## 2023-12-27 NOTE — Progress Notes (Signed)
 Diagnosis: Iron Deficiency Anemia  Provider:  Chilton Greathouse MD  Procedure: IV Infusion  IV Type: Peripheral, IV Location: R Antecubital  Feraheme (Ferumoxytol), Dose: 510 mg  Infusion Start Time: 1459  Infusion Stop Time: 1515  Post Infusion IV Care: Observation period completed and Peripheral IV Discontinued  Discharge: Condition: Good, Destination: Home . AVS Provided  Performed by:  Adriana Mccallum, RN

## 2023-12-28 ENCOUNTER — Telehealth: Payer: Self-pay | Admitting: *Deleted

## 2023-12-28 NOTE — Telephone Encounter (Signed)
 Cardiac Catheterization scheduled at [facility] for: Friday December 30, 2023 9 AM /TEE 7:30 AM Arrival time [facility] Main Entrance A at: 6:30 AM  Nothing to eat or drink after midnight prior to procedures.  Medication instructions: -Hold:  Triamterene/hydrochlorothiazide-AM of procedure -Other usual morning medications can be taken with sips of water including aspirin 81 mg.  Plan to go home the same day, you will only stay overnight if medically necessary.  You must have responsible adult to drive you home.  Someone must be with you the first 24 hours after you arrive home.  Reviewed procedure instructions with patient.

## 2023-12-29 NOTE — Progress Notes (Signed)
 Spoke to pt and instructed them to come at 0630 and to be NPO after 0000.  Confirmed that pt will have a ride home and someone to stay with them for 24 hours after the procedure. Instructed patient to not wear any jewelry or lotion.

## 2023-12-29 NOTE — Anesthesia Preprocedure Evaluation (Signed)
 Anesthesia Evaluation  Patient identified by MRN, date of birth, ID band Patient awake    Reviewed: Allergy & Precautions, NPO status , Patient's Chart, lab work & pertinent test results, reviewed documented beta blocker date and time   Airway Mallampati: II  TM Distance: >3 FB Neck ROM: Full    Dental no notable dental hx. (+) Dental Advisory Given, Teeth Intact   Pulmonary neg pulmonary ROS   Pulmonary exam normal breath sounds clear to auscultation       Cardiovascular hypertension, Pt. on medications and Pt. on home beta blockers pulmonary hypertension+ Valvular Problems/Murmurs MR  Rhythm:Regular Rate:Normal + Systolic murmurs Echo 11/2023  1. Left ventricular ejection fraction, by estimation, is 60 to 65%. The left ventricle has normal function. The left ventricle has no regional wall motion abnormalities. The left ventricular internal cavity size was mildly dilated. Left ventricular diastolic parameters are indeterminate. The average left ventricular global longitudinal strain is -23.3%. The global longitudinal strain is normal.   2. Right ventricular systolic function is normal. The right ventricular size is mildly enlarged. There is moderately elevated pulmonary artery systolic pressure. The estimated right ventricular systolic pressure is 48.2 mmHg.   3. Left atrial size was severely dilated.   4. Right atrial size was mild to moderately dilated.   5. The mitral valve is degenerative. Severe mitral valve regurgitation. There is prolapse of both leaflets of the mitral valve.   6. The tricuspid valve is degenerative. Tricuspid valve regurgitation is moderate.   7. The aortic valve is normal in structure. Aortic valve regurgitation is mild. Aortic valve sclerosis is present (LCC less mobile), with no evidence of aortic valve stenosis.   8. The inferior vena cava is normal in size with <50% respiratory variability, suggesting right  atrial pressure of 8 mmHg.   Comparison(s): No prior Echocardiogram.    Echocardiogram 05/10/2023:   Normal LV systolic function with EF 59%. Left ventricle cavity is normal in size. Normal left ventricular wall thickness. Normal global wall motion. Indeterminate diastolic filling pattern due to severity of mitral regurgitation. Calculated EF 59%.  Left atrial cavity is severely dilated at 46.8 ml/m^2.  Right atrial cavity is mildly dilated.  Trileaflet aortic valve. Trace aortic regurgitation.  Moderate prolapse of the mitral valve leaflets. Severe (Grade III) mitral regurgitation, reversal of flow in the right pulmonary vein.  Mild prolapse of the tricuspid valve leaflets. Mild tricuspid  regurgitation. Mild pulmonary hypertension. RVSP measures 36 mmHg.  IVC is normal with respiratory variation.  Compared to the study done on 04/26/2022, mitral regurgitation severity has  slightly progressed from moderately severe to severe.  No change in PA pressure.  No other significant changes.     Neuro/Psych negative neurological ROS     GI/Hepatic Neg liver ROS,GERD  Medicated and Controlled,,  Endo/Other  negative endocrine ROS    Renal/GU negative Renal ROS     Musculoskeletal  (+) Arthritis ,    Abdominal   Peds  Hematology  (+) Blood dyscrasia, anemia   Anesthesia Other Findings   Reproductive/Obstetrics                             Anesthesia Physical Anesthesia Plan  ASA: 4  Anesthesia Plan: MAC   Post-op Pain Management: Minimal or no pain anticipated   Induction: Intravenous  PONV Risk Score and Plan: 2 and Treatment may vary due to age or medical condition, Propofol infusion and TIVA  Airway Management Planned: Natural Airway  Additional Equipment:   Intra-op Plan:   Post-operative Plan:   Informed Consent: I have reviewed the patients History and Physical, chart, labs and discussed the procedure including the risks, benefits and  alternatives for the proposed anesthesia with the patient or authorized representative who has indicated his/her understanding and acceptance.     Dental advisory given  Plan Discussed with: CRNA  Anesthesia Plan Comments:         Anesthesia Quick Evaluation

## 2023-12-30 ENCOUNTER — Other Ambulatory Visit: Payer: Self-pay

## 2023-12-30 ENCOUNTER — Encounter (HOSPITAL_COMMUNITY)
Admission: RE | Disposition: A | Discharge status: Home / Self Care | Payer: Self-pay | Source: Home / Self Care | Attending: Cardiology

## 2023-12-30 ENCOUNTER — Observation Stay (HOSPITAL_COMMUNITY)
Admission: RE | Admit: 2023-12-30 | Discharge: 2023-12-31 | Disposition: A | Discharge status: Home / Self Care | Payer: Medicare HMO | Attending: Internal Medicine | Admitting: Internal Medicine

## 2023-12-30 ENCOUNTER — Ambulatory Visit (HOSPITAL_COMMUNITY)
Admission: RE | Admit: 2023-12-30 | Discharge: 2023-12-30 | Disposition: A | Discharge status: Home / Self Care | Payer: Medicare HMO | Source: Ambulatory Visit | Attending: Internal Medicine | Admitting: Internal Medicine

## 2023-12-30 ENCOUNTER — Ambulatory Visit (HOSPITAL_COMMUNITY): Payer: Self-pay | Admitting: Anesthesiology

## 2023-12-30 ENCOUNTER — Encounter (HOSPITAL_COMMUNITY): Payer: Self-pay | Admitting: Cardiology

## 2023-12-30 DIAGNOSIS — I10 Essential (primary) hypertension: Secondary | ICD-10-CM | POA: Diagnosis not present

## 2023-12-30 DIAGNOSIS — R0600 Dyspnea, unspecified: Secondary | ICD-10-CM | POA: Diagnosis not present

## 2023-12-30 DIAGNOSIS — I1310 Hypertensive heart and chronic kidney disease without heart failure, with stage 1 through stage 4 chronic kidney disease, or unspecified chronic kidney disease: Secondary | ICD-10-CM | POA: Diagnosis not present

## 2023-12-30 DIAGNOSIS — Z79899 Other long term (current) drug therapy: Secondary | ICD-10-CM | POA: Diagnosis not present

## 2023-12-30 DIAGNOSIS — N1831 Chronic kidney disease, stage 3a: Secondary | ICD-10-CM | POA: Diagnosis not present

## 2023-12-30 DIAGNOSIS — I4891 Unspecified atrial fibrillation: Secondary | ICD-10-CM

## 2023-12-30 DIAGNOSIS — E7800 Pure hypercholesterolemia, unspecified: Secondary | ICD-10-CM | POA: Diagnosis not present

## 2023-12-30 DIAGNOSIS — D500 Iron deficiency anemia secondary to blood loss (chronic): Secondary | ICD-10-CM | POA: Diagnosis not present

## 2023-12-30 DIAGNOSIS — Q228 Other congenital malformations of tricuspid valve: Secondary | ICD-10-CM | POA: Diagnosis not present

## 2023-12-30 DIAGNOSIS — I340 Nonrheumatic mitral (valve) insufficiency: Secondary | ICD-10-CM

## 2023-12-30 DIAGNOSIS — Z8542 Personal history of malignant neoplasm of other parts of uterus: Secondary | ICD-10-CM | POA: Insufficient documentation

## 2023-12-30 DIAGNOSIS — E785 Hyperlipidemia, unspecified: Secondary | ICD-10-CM | POA: Insufficient documentation

## 2023-12-30 HISTORY — PX: RIGHT/LEFT HEART CATH AND CORONARY ANGIOGRAPHY: CATH118266

## 2023-12-30 HISTORY — PX: TRANSESOPHAGEAL ECHOCARDIOGRAM (CATH LAB): EP1270

## 2023-12-30 LAB — CBC
HCT: 35.1 % — ABNORMAL LOW (ref 36.0–46.0)
Hemoglobin: 11.6 g/dL — ABNORMAL LOW (ref 12.0–15.0)
MCH: 28.4 pg (ref 26.0–34.0)
MCHC: 33 g/dL (ref 30.0–36.0)
MCV: 85.8 fL (ref 80.0–100.0)
Platelets: 162 10*3/uL (ref 150–400)
RBC: 4.09 MIL/uL (ref 3.87–5.11)
RDW: 16.7 % — ABNORMAL HIGH (ref 11.5–15.5)
WBC: 6.7 10*3/uL (ref 4.0–10.5)
nRBC: 0 % (ref 0.0–0.2)

## 2023-12-30 LAB — MAGNESIUM: Magnesium: 2 mg/dL (ref 1.7–2.4)

## 2023-12-30 LAB — COMPREHENSIVE METABOLIC PANEL
ALT: 16 U/L (ref 0–44)
AST: 26 U/L (ref 15–41)
Albumin: 3.5 g/dL (ref 3.5–5.0)
Alkaline Phosphatase: 29 U/L — ABNORMAL LOW (ref 38–126)
Anion gap: 10 (ref 5–15)
BUN: 12 mg/dL (ref 8–23)
CO2: 22 mmol/L (ref 22–32)
Calcium: 8.6 mg/dL — ABNORMAL LOW (ref 8.9–10.3)
Chloride: 108 mmol/L (ref 98–111)
Creatinine, Ser: 0.76 mg/dL (ref 0.44–1.00)
GFR, Estimated: >60 mL/min (ref >60)
Glucose, Bld: 104 mg/dL — ABNORMAL HIGH (ref 70–99)
Potassium: 3.5 mmol/L (ref 3.5–5.1)
Sodium: 140 mmol/L (ref 135–145)
Total Bilirubin: 0.7 mg/dL (ref 0.0–1.2)
Total Protein: 6 g/dL — ABNORMAL LOW (ref 6.5–8.1)

## 2023-12-30 LAB — TSH: TSH: 3.482 u[IU]/mL (ref 0.350–4.500)

## 2023-12-30 SURGERY — TRANSESOPHAGEAL ECHOCARDIOGRAM (TEE) (CATHLAB)
Anesthesia: Monitor Anesthesia Care

## 2023-12-30 SURGERY — RIGHT/LEFT HEART CATH AND CORONARY ANGIOGRAPHY
Anesthesia: LOCAL

## 2023-12-30 MED ORDER — PHENYLEPHRINE HCL-NACL 20-0.9 MG/250ML-% IV SOLN: INTRAVENOUS | Status: DC | PRN

## 2023-12-30 MED ORDER — LIDOCAINE HCL (PF) 1 % IJ SOLN
INTRAMUSCULAR | Status: AC
  Filled 2023-12-30: qty 30

## 2023-12-30 MED ORDER — VERAPAMIL HCL 2.5 MG/ML IV SOLN
INTRAVENOUS | Status: AC
  Filled 2023-12-30: qty 2

## 2023-12-30 MED ORDER — ATORVASTATIN CALCIUM 10 MG PO TABS
10.0000 mg | ORAL_TABLET | Freq: Every day | ORAL | Status: DC
  Administered 2023-12-30 – 2023-12-31 (×2): 10 mg via ORAL
  Filled 2023-12-30 (×2): qty 1

## 2023-12-30 MED ORDER — MIDAZOLAM HCL 2 MG/2ML IJ SOLN
INTRAMUSCULAR | Status: AC
  Filled 2023-12-30: qty 2

## 2023-12-30 MED ORDER — BUTAMBEN-TETRACAINE-BENZOCAINE 2-2-14 % EX AERO
INHALATION_SPRAY | CUTANEOUS | Status: AC
  Filled 2023-12-30: qty 20

## 2023-12-30 MED ORDER — SODIUM CHLORIDE 0.9 % IV SOLN: INTRAVENOUS | Status: AC

## 2023-12-30 MED ORDER — PANTOPRAZOLE SODIUM 40 MG PO TBEC
40.0000 mg | DELAYED_RELEASE_TABLET | Freq: Every day | ORAL | Status: DC
  Administered 2023-12-30 – 2023-12-31 (×2): 40 mg via ORAL
  Filled 2023-12-30 (×2): qty 1

## 2023-12-30 MED ORDER — SODIUM CHLORIDE 0.9 % IV SOLN: 250.0000 mL | INTRAVENOUS | Status: AC | PRN

## 2023-12-30 MED ORDER — HEPARIN SODIUM (PORCINE) 1000 UNIT/ML IJ SOLN
INTRAMUSCULAR | Status: DC | PRN
  Administered 2023-12-30: 3000 [IU] via INTRAVENOUS

## 2023-12-30 MED ORDER — LIDOCAINE HCL (PF) 1 % IJ SOLN
INTRAMUSCULAR | Status: DC | PRN
Start: 2023-12-30 — End: 2023-12-30
  Administered 2023-12-30 (×2): 5 mL

## 2023-12-30 MED ORDER — LOPERAMIDE HCL 2 MG PO CAPS: 2.0000 mg | ORAL_CAPSULE | Freq: Four times a day (QID) | ORAL | Status: DC | PRN

## 2023-12-30 MED ORDER — APIXABAN 5 MG PO TABS
5.0000 mg | ORAL_TABLET | Freq: Two times a day (BID) | ORAL | Status: DC
Start: 2023-12-30 — End: 2023-12-31
  Administered 2023-12-30 – 2023-12-31 (×2): 5 mg via ORAL
  Filled 2023-12-30 (×2): qty 1

## 2023-12-30 MED ORDER — ACETAMINOPHEN 325 MG PO TABS
650.0000 mg | ORAL_TABLET | ORAL | Status: DC | PRN
  Administered 2023-12-30: 650 mg via ORAL
  Filled 2023-12-30: qty 2

## 2023-12-30 MED ORDER — ASPIRIN 81 MG PO CHEW: 81.0000 mg | CHEWABLE_TABLET | ORAL | Status: DC

## 2023-12-30 MED ORDER — AMIODARONE LOAD VIA INFUSION
150.0000 mg | Freq: Once | INTRAVENOUS | Status: AC
  Administered 2023-12-30: 150 mg via INTRAVENOUS
  Filled 2023-12-30: qty 83.34

## 2023-12-30 MED ORDER — MIDAZOLAM HCL 2 MG/2ML IJ SOLN
INTRAMUSCULAR | Status: DC | PRN
  Administered 2023-12-30 (×2): 1 mg via INTRAVENOUS

## 2023-12-30 MED ORDER — TRIAMTERENE-HCTZ 37.5-25 MG PO TABS
0.5000 | ORAL_TABLET | Freq: Every day | ORAL | Status: DC
  Filled 2023-12-30: qty 1

## 2023-12-30 MED ORDER — EPHEDRINE SULFATE-NACL 50-0.9 MG/10ML-% IV SOSY
PREFILLED_SYRINGE | INTRAVENOUS | Status: DC | PRN
  Administered 2023-12-30: 5 mg via INTRAVENOUS

## 2023-12-30 MED ORDER — PROPOFOL 500 MG/50ML IV EMUL
INTRAVENOUS | Status: DC | PRN
  Administered 2023-12-30: 75 ug/kg/min via INTRAVENOUS

## 2023-12-30 MED ORDER — HEPARIN (PORCINE) IN NACL 1000-0.9 UT/500ML-% IV SOLN
INTRAVENOUS | Status: DC | PRN
  Administered 2023-12-30 (×2): 500 mL

## 2023-12-30 MED ORDER — IOHEXOL 350 MG/ML SOLN
INTRAVENOUS | Status: DC | PRN
  Administered 2023-12-30: 35 mL

## 2023-12-30 MED ORDER — HEPARIN SODIUM (PORCINE) 1000 UNIT/ML IJ SOLN
INTRAMUSCULAR | Status: AC
  Filled 2023-12-30: qty 10

## 2023-12-30 MED ORDER — SODIUM CHLORIDE 0.9% FLUSH: 3.0000 mL | INTRAVENOUS | Status: DC | PRN

## 2023-12-30 MED ORDER — PHENYLEPHRINE HCL-NACL 20-0.9 MG/250ML-% IV SOLN
INTRAVENOUS | Status: DC | PRN
  Administered 2023-12-30 (×2): 80 ug via INTRAVENOUS

## 2023-12-30 MED ORDER — AMIODARONE HCL IN DEXTROSE 360-4.14 MG/200ML-% IV SOLN
30.0000 mg/h | INTRAVENOUS | Status: DC
  Administered 2023-12-30 – 2023-12-31 (×2): 30 mg/h via INTRAVENOUS
  Filled 2023-12-30 (×2): qty 200

## 2023-12-30 MED ORDER — FENTANYL CITRATE (PF) 100 MCG/2ML IJ SOLN
INTRAMUSCULAR | Status: AC
  Filled 2023-12-30: qty 2

## 2023-12-30 MED ORDER — VERAPAMIL HCL 2.5 MG/ML IV SOLN
INTRAVENOUS | Status: DC | PRN
  Administered 2023-12-30: 10 mL via INTRA_ARTERIAL

## 2023-12-30 MED ORDER — ONDANSETRON HCL 4 MG/2ML IJ SOLN: 4.0000 mg | Freq: Four times a day (QID) | INTRAMUSCULAR | Status: DC | PRN

## 2023-12-30 MED ORDER — FENTANYL CITRATE (PF) 100 MCG/2ML IJ SOLN
INTRAMUSCULAR | Status: DC | PRN
  Administered 2023-12-30 (×2): 25 ug via INTRAVENOUS

## 2023-12-30 MED ORDER — AMIODARONE HCL IN DEXTROSE 360-4.14 MG/200ML-% IV SOLN
60.0000 mg/h | INTRAVENOUS | Status: AC
  Administered 2023-12-30 (×2): 60 mg/h via INTRAVENOUS
  Filled 2023-12-30: qty 200

## 2023-12-30 MED ORDER — EZETIMIBE 10 MG PO TABS
10.0000 mg | ORAL_TABLET | Freq: Every day | ORAL | Status: DC
  Administered 2023-12-30 – 2023-12-31 (×2): 10 mg via ORAL
  Filled 2023-12-30 (×2): qty 1

## 2023-12-30 MED ORDER — METOPROLOL SUCCINATE ER 25 MG PO TB24
25.0000 mg | ORAL_TABLET | Freq: Two times a day (BID) | ORAL | Status: DC
  Administered 2023-12-30 – 2023-12-31 (×2): 25 mg via ORAL
  Filled 2023-12-30 (×2): qty 1

## 2023-12-30 SURGICAL SUPPLY — 16 items
CATH INFINITI 5 FR JL3.5 (CATHETERS) IMPLANT
CATH INFINITI 5FR AL1 (CATHETERS) IMPLANT
CATH INFINITI AMBI 5FR TG (CATHETERS) IMPLANT
CATH SWAN GANZ 7F STRAIGHT (CATHETERS) IMPLANT
DEVICE RAD COMP TR BAND LRG (VASCULAR PRODUCTS) IMPLANT
GLIDESHEATH SLEND A-KIT 6F 22G (SHEATH) IMPLANT
GLIDESHEATH SLENDER 7FR .021G (SHEATH) IMPLANT
GUIDEWIRE .025 260CM (WIRE) IMPLANT
GUIDEWIRE ANGLED .035X150CM (WIRE) IMPLANT
GUIDEWIRE INQWIRE 1.5J.035X260 (WIRE) IMPLANT
INQWIRE 1.5J .035X260CM (WIRE) ×1 IMPLANT
KIT MICROPUNCTURE NIT STIFF (SHEATH) IMPLANT
PACK CARDIAC CATHETERIZATION (CUSTOM PROCEDURE TRAY) ×1 IMPLANT
SET ATX-X65L (MISCELLANEOUS) IMPLANT
SHEATH PINNACLE 7F 10CM (SHEATH) IMPLANT
SHEATH PROBE COVER 6X72 (BAG) IMPLANT

## 2023-12-30 NOTE — Progress Notes (Signed)
 Echocardiogram 2D Echocardiogram has been performed.  Lucendia Herrlich 12/30/2023, 9:07 AM

## 2023-12-30 NOTE — Interval H&P Note (Signed)
 History and Physical Interval Note:  12/30/2023 11:07 AM  [name]  has presented today for surgery, with the diagnosis of mr.  The various methods of treatment have been discussed with the patient and family. After consideration of risks, benefits and other options for treatment, the patient has consented to  Procedure(s): RIGHT/LEFT HEART CATH AND CORONARY ANGIOGRAPHY (N/A) as a surgical intervention.  The patient's history has been reviewed, patient examined, no change in status, stable for surgery.  I have reviewed the patient's chart and labs.  Questions were answered to the patient's satisfaction.     Yates Decamp

## 2023-12-30 NOTE — Anesthesia Postprocedure Evaluation (Signed)
 Anesthesia Post Note  Patient: [name]  Procedure(s) Performed: TRANSESOPHAGEAL ECHOCARDIOGRAM     Patient location during evaluation: PACU Anesthesia Type: MAC Level of consciousness: awake and alert Pain management: pain level controlled Vital Signs Assessment: post-procedure vital signs reviewed and stable Respiratory status: spontaneous breathing Cardiovascular status: stable Anesthetic complications: no   No notable events documented.  Last Vitals:  Vitals:   12/30/23 1315 12/30/23 1330  BP: 118/64 121/66  Pulse: 74 72  Resp: 16   Temp:    SpO2: 95% 96%    Last Pain:  Vitals:   12/30/23 1305  TempSrc:   PainSc: 0-No pain                 Lewie Loron

## 2023-12-30 NOTE — CV Procedure (Addendum)
 INDICATIONS: MR, TR  PROCEDURE:   Informed consent was obtained prior to the procedure. The risks, benefits and alternatives for the procedure were discussed and the patient comprehended these risks.  Risks include, but are not limited to, cough, sore throat, vomiting, nausea, somnolence, esophageal and stomach trauma or perforation, bleeding, low blood pressure, aspiration, pneumonia, infection, trauma to the teeth and death.    After a procedural time-out, the oropharynx was anesthetized with 20% benzocaine spray.   During this procedure the patient was administered propofol per anesthesia.  The patient's heart rate, blood pressure, and oxygen saturation were monitored continuously during the procedure. The period of conscious sedation was 45 minutes, of which I was present face-to-face 100% of this time.  The transesophageal probe was inserted in the esophagus and stomach without difficulty and multiple views were obtained.  The patient was kept under observation until the patient left the procedure room.  The patient left the procedure room in stable condition.   Agitated microbubble saline contrast was administered.  COMPLICATIONS:    There were no immediate complications.  FINDINGS:   FORMAL ECHOCARDIOGRAM REPORT PENDING Normal biventricular function, LVEF 60-65%. Moderate TR with mildly degenerative change, no frank prolapse noted. Degenerative mitral valve with mitral valve annular calcifications, and myxomatous change of the mitral valve. Prolapse of posterior leaflet, most prominent at P1, with anterior leaflet override. On anterior leaflet, myxomatous change of A2 scallop. Severe MR with systolic reversals in pulmonary veins.  Possible tiny PFO with L to R shunt and negative bubble study.   RECOMMENDATIONS:     Ok to proceed to cath.   Time Spent Directly with the Patient:  60 minutes   Parke Poisson 12/30/2023, 8:42 AM

## 2023-12-30 NOTE — H&P (Addendum)
 Cardiology Admission History and Physical   Patient ID: [name] MRN: [id number]; DOB: 10-Aug-1941   Admission date: 12/30/2023  PCP:  Merri Brunette, MD   [facility] Providers Cardiologist:  Yates Decamp, MD   {  Chief Complaint: New onset atrial fibrillation  Patient Profile:   [name] is a 83 y.o. female with severe MR, iron deficiency anemia, endometrial cancer 2009 treated with radiation/surgery, hypertension, hyperlipidemia, gastritis 2019 who is being seen 12/30/2023 for the evaluation of new onset atrial fibrillation.  History of Present Illness:   Ms. Bramel is currently undergoing workup for severe mitral regurgitation.  Today she was undergoing TEE and cardiac catheterization.  Cath results show mild pulmonary hypertension with prominent V waves, approximate A-wave 12, V wave 22.  QP/QS ratio 1.  Preserved RV function with preserved cardiac output.  LVEDP 11.  No significant CAD.  Postoperatively after her catheterization she went into atrial fibrillation RVR with heart rates between 130 150, generally asymptomatic with mild complaints of palpitations.  In speaking with the patient she believes that she might have been in A-fib previously.  She reports that her PCP had ordered previous heart monitor for palpitations going on for the past year however I do not see any viewable results in the chart to confirm these findings.  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  Does not report any shortness of breath with this activity however for the last 6 months she does report having some increased shortness of breath when walking up steps.  Also has had mild peripheral edema.  Otherwise no chest pain, dizziness, lightheadedness, syncope, falls.  I have ordered basic labs.   Past Medical History:  Diagnosis Date   Anemia    Arthritis    Cataract    Diverticulosis of  colon    Endometrial cancer (HCC)    1A grade endometrial ca   Essential hypertension 01/24/2009   Qualifier: Diagnosis of  By: Nelson-Smith CMA (AAMA), Dottie     Hx of radiation therapy 04/24/08-05/29/08& 8/12/,8/19,&06/27/08   external beam  through 7/09 ,then intracavity in 06/2008   Hypertension    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    Past Surgical History:  Procedure Laterality Date   APPENDECTOMY     1967   arthroscopic knee right surgery     ECTOPIC PREGNANCY SURGERY  1967   iron infusion  12/2022   LAPAROSCOPIC HYSTERECTOMY     total   ORTHOPEDIC SURGERY     Orthoscopic Knee Surgery   TEAR DUCT PROBING     TOOTH EXTRACTION     bone graft     Medications Prior to Admission: Prior to Admission medications   Medication Sig Start Date End Date Taking? Authorizing Provider  b complex vitamins tablet Take 1 tablet by mouth daily.     Yes [provider]  Bempedoic Acid-Ezetimibe (NEXLIZET) 180-10 MG TABS Take 1 tablet by mouth daily.   Yes [provider]  CALCIUM PO Take 650 mg by mouth daily.   Yes [provider]  Cholecalciferol (VITAMIN D3) 125 MCG (5000 UT) CAPS Take 5,000 Units by mouth daily.   Yes [provider]  denosumab (PROLIA) 60 MG/ML SOSY injection Inject 60 mg into the skin every 6 (six) months.   Yes [provider]  Garlic 1000 MG CAPS Take 1,000 mg by mouth daily.  Yes [provider]  [place] Lanius Omega-3 300 MG CAPS Take 300 mg by mouth daily.   Yes [provider]  loperamide (IMODIUM) 2 MG capsule Take 1 capsule (2 mg total) by mouth 4 (four) times daily as needed for diarrhea or loose stools. 01/10/21  Yes Linwood Dibbles, MD  metoprolol succinate (TOPROL-XL) 25 MG 24 hr tablet Take 25 mg by mouth 2 (two) times daily.  Take additional tablet for palpitations   Yes [provider]  Multiple Vitamins-Minerals (MULTIVITAMINS THER. W/MINERALS) TABS Take 1 tablet by mouth daily.     Yes  [provider]  pantoprazole (PROTONIX) 40 MG tablet Take 40 mg by mouth daily.   Yes [provider]  triamterene-hydrochlorothiazide (MAXZIDE-25) 37.5-25 MG tablet Take 0.5-1 tablets by mouth daily. 04/07/22  Yes [provider]  Zinc 50 MG TABS Take 50 mg by mouth daily.   Yes [provider]  atorvastatin (LIPITOR) 10 MG tablet Take 1 tablet (10 mg total) by mouth daily. 12/20/23 03/19/24  Yates Decamp, MD  ezetimibe (ZETIA) 10 MG tablet Take 1 tablet (10 mg total) by mouth daily. 12/20/23 03/19/24  Yates Decamp, MD     Allergies:    Allergies  Allergen Reactions   Crestor [Rosuvastatin] Other (See Comments)    Leg cramps   Glycopyrrolate     Other reaction(s): extreme dry mouth   Codeine Other (See Comments)    Hyper    Social History:   Social History   Socioeconomic History   Marital status: Married    Spouse name: Not on file   Number of children: 1   Years of education: Not on file   Highest education level: Not on file  Occupational History   Occupation: Environmental health practitioner  Tobacco Use   Smoking status: Never   Smokeless tobacco: Never  Vaping Use   Vaping status: Never Used  Substance and Sexual Activity   Alcohol use: Yes    Alcohol/week: 0.0 standard drinks of alcohol    Comment: less then 2 a month   Drug use: No   Sexual activity: Never  Other Topics Concern   Not on file  Social History Narrative   Not on file   Social Drivers of Health   Financial Resource Strain: Not on file  Food Insecurity: Not on file  Transportation Needs: Not on file  Physical Activity: Not on file  Stress: Not on file  Social Connections: Not on file  Intimate Partner Violence: Not on file    Family History:   The patient's family history includes Breast cancer in her mother and sister; Cancer (age of onset: 72) in her brother; Diabetes in her maternal grandfather and maternal grandmother; Diabetes type II in her mother and sister;  Leukemia in her father; Pancreatic cancer in her maternal aunt. There is no history of Colon cancer, Stomach cancer, or Esophageal cancer.    ROS:  Please see the history of present illness.  All other ROS reviewed and negative.     Physical Exam/Data:   Vitals:   12/30/23 1430 12/30/23 1440 12/30/23 1450 12/30/23 1540  BP: (!) 122/98 115/79 121/76 105/71  Pulse: (!) 136 (!) 138 (!) 133 (!) 149  Resp: (!) 23 19 19 20   Temp:    97.6 F (36.4 C)  TempSrc:    Oral  SpO2: 95% 95% 93% 95%  Weight:      Height:        Intake/Output Summary (Last 24 hours)  at 12/30/2023 1547 Last data filed at 12/30/2023 0854 Gross per 24 hour  Intake 741.67 ml  Output --  Net 741.67 ml      12/30/2023    6:51 AM 12/27/2023    2:21 PM 12/20/2023    2:17 PM  Last 3 Weights  Weight (lbs) 156 lb 156 lb 3.2 oz 153 lb 12.8 oz  Weight (kg) 70.761 kg 70.852 kg 69.763 kg     Body mass index is 26.78 kg/m.  General:  Well nourished, well developed, in no acute distress HEENT: normal Neck: + JVD Vascular: No carotid bruits; Distal pulses 2+ bilaterally   Cardiac: Irregularly irregular, tachycardic Lungs:  clear to auscultation bilaterally, no wheezing, rhonchi or rales  Abd: soft, nontender, no hepatomegaly  Ext: Mild edema Musculoskeletal:  No deformities, BUE and BLE strength normal and equal Skin: warm and dry  Neuro:  CNs 2-12 intact, no focal abnormalities noted Psych:  Normal affect    EKG: Atrial fibrillation, heart rate 136.  Mild ST depressions likely related to rate.  Relevant CV Studies: Right & Left Heart Catheterization 12/30/23: Hemodynamic data: Right heart catheterization consistent with mild pulmonary hypertension with normal LVEDP, prominent V waves on wedge pressure tracing suggests significant MR, both right upper pulmonary and left upper pulmonary veins were interrogated, approximate A wave 12 mmHg and V waves 22 mmHg. QP/QS 1.00, PAPi 3.0 suggest preserved RV function. CO  7.76, CI 4.41, elevated due to significant MR probably. LVEDP 11 mmHg, no pressure differential between aorta and LV, no suggestion of aortic stenosis.   Angiographic data: RCA: Superdominant vessel.  It is tortuous in the midsegment.  Otherwise smooth and normal. LM: Large-caliber vessel smooth and normal. LAD: Ends before reaching the LV apex, gives origin to large D1 with secondary branches, has minimal disease in the midsegment. LCx: Small vessel, small OM1.      Impression and recommendations: Findings suggestive of severe MR.  Will await on formal reports on TEE and make final recommendations.   Laboratory Data:  High Sensitivity Troponin:  No results for input(s): "TROPONINIHS" in the last 720 hours.    ChemistryNo results for input(s): "NA", "K", "CL", "CO2", "GLUCOSE", "BUN", "CREATININE", "CALCIUM", "MG", "GFRNONAA", "GFRAA", "ANIONGAP" in the last 168 hours.  No results for input(s): "PROT", "ALBUMIN", "AST", "ALT", "ALKPHOS", "BILITOT" in the last 168 hours. Lipids No results for input(s): "CHOL", "TRIG", "HDL", "LABVLDL", "LDLCALC", "CHOLHDL" in the last 168 hours. HematologyNo results for input(s): "WBC", "RBC", "HGB", "HCT", "MCV", "MCH", "MCHC", "RDW", "PLT" in the last 168 hours. Thyroid No results for input(s): "TSH", "FREET4" in the last 168 hours. BNPNo results for input(s): "BNP", "PROBNP" in the last 168 hours.  DDimer No results for input(s): "DDIMER" in the last 168 hours.  TEE still pending  Echocardiogram 11/14/2023 1. Left ventricular ejection fraction, by estimation, is 60 to 65%. The  left ventricle has normal function. The left ventricle has no regional  wall motion abnormalities. The left ventricular internal cavity size was  mildly dilated. Left ventricular  diastolic parameters are indeterminate. The average left ventricular  global longitudinal strain is -23.3 %. The global longitudinal strain is  normal.   2. Right ventricular systolic function is  normal. The right ventricular  size is mildly enlarged. There is moderately elevated pulmonary artery  systolic pressure. The estimated right ventricular systolic pressure is  48.2 mmHg.   3. Left atrial size was severely dilated.   4. Right atrial size was mild to moderately dilated.  5. The mitral valve is degenerative. Severe mitral valve regurgitation.  There is prolapse of both leaflets of the mitral valve.   6. The tricuspid valve is degenerative. Tricuspid valve regurgitation is  moderate.   7. The aortic valve is normal in structure. Aortic valve regurgitation is  mild. Aortic valve sclerosis is present (LCC less mobile), with no  evidence of aortic valve stenosis.   8. The inferior vena cava is normal in size with <50% respiratory  variability, suggesting right atrial pressure of 8 mmHg.   Comparison(s): No prior Echocardiogram.   Radiology/Studies:  CARDIAC CATHETERIZATION Result Date: 12/30/2023 Images from the original result were not included. Right & Left Heart Catheterization 12/30/23: Hemodynamic data: Right heart catheterization consistent with mild pulmonary hypertension with normal LVEDP, prominent V waves on wedge pressure tracing suggests significant MR, both right upper pulmonary and left upper pulmonary veins were interrogated, approximate A wave 12 mmHg and V waves 22 mmHg. QP/QS 1.00, PAPi 3.0 suggest preserved RV function. CO 7.76, CI 4.41, elevated due to significant MR probably. LVEDP 11 mmHg, no pressure differential between aorta and LV, no suggestion of aortic stenosis. Angiographic data: RCA: Superdominant vessel.  It is tortuous in the midsegment.  Otherwise smooth and normal. LM: Large-caliber vessel smooth and normal. LAD: Ends before reaching the LV apex, gives origin to large D1 with secondary branches, has minimal disease in the midsegment. LCx: Small vessel, small OM1. Impression and recommendations: Findings suggestive of severe MR.  Will await on formal  reports on TEE and make final recommendations.   EP STUDY Result Date: 12/30/2023 See surgical note for result.    Assessment and Plan:   New onset atrial fibrillation RVR Post cardiac catheterization patient into A-fib RVR with heart rates in the 130s to 150s, mild complaints of palpitations. Pressures soft.  May also be exacerbated by anemia, question if she has been in this paroxysmaly throughout this year. Per Dr. Jacinto Halim starting IV amiodarone, Eliquis 5 mg twice daily (has normal renal function and is 70kg) As long as TEE looks okay, likely can plan for DCCV Monday. Will continue her home Toprol XL 25 mg Checking TSH, BMP for electrolyte abnormalities and magnesium Will defer repeating echocardiogram with elevated rates and recent study January 2025. Consider sleep study if there are signs of OSA as well  Severe MR Echocardiogram showing preserved biventricular function with severely dilated LA.  Degenerative MV with severe MR with prolapse of both leaflets.  Degenerative TR/moderate TR.  Mild AR.  Preoperative right left heart catheterization indicating mild pulmonary hypertension with normal LVEDP.  Prominent V waves.  V wave 22, A-wave 12.  No evidence of AS.  No significant coronary disease.  Still awaiting TEE report.  Iron deficiency anemia Currently receiving iron transfusions for heme oncologist.  Hemoglobin 11.1 and stable.  Hyperlipidemia She was on Nexlizet, but transitioned to atorvastatin 10 mg (history of myalgias), Zetia.  Hypertension Well-controlled.  On Toprol-XL as above and Maxide 37.5/25 mg (hold) at home.    Risk Assessment/Risk Scores:     CHA2DS2-VASc Score = 4  This indicates a 4.8% annual risk of stroke. The patient's score is based upon: CHF History: 0 HTN History: 1 Diabetes History: 0 Stroke History: 0 Vascular Disease History: 0 Age Score: 2 Gender Score: 1    Code Status: Full Code  Severity of Illness: The appropriate patient  status for this patient is OBSERVATION. Observation status is judged to be reasonable and necessary in order to provide the  required intensity of service to ensure the patient's safety. The patient's presenting symptoms, physical exam findings, and initial radiographic and laboratory data in the context of their medical condition is felt to place them at decreased risk for further clinical deterioration. Furthermore, it is anticipated that the patient will be medically stable for discharge from the hospital within 2 midnights of admission.    For questions or updates, please contact [facility] Please consult [email or web address] for contact info under     Signed, Abagail Kitchens, PA-C  12/30/2023 3:47 PM

## 2023-12-30 NOTE — Transfer of Care (Signed)
 Immediate Anesthesia Transfer of Care Note  Patient: [name]  Procedure(s) Performed: TRANSESOPHAGEAL ECHOCARDIOGRAM  Patient Location: PACU and Cath Lab  Anesthesia Type:MAC  Level of Consciousness: awake, alert , and oriented  Airway & Oxygen Therapy: Patient Spontanous Breathing  Post-op Assessment: Report given to RN and Post -op Vital signs reviewed and stable  Post vital signs: Reviewed and stable  Last Vitals:  Vitals Value Taken Time  BP 92/62 12/30/23 0858  Temp    Pulse 72 12/30/23 0900  Resp 15 12/30/23 0900  SpO2 97 % 12/30/23 0900  Vitals shown include unfiled device data.  Last Pain:  Vitals:   12/30/23 0651  TempSrc: Temporal  PainSc: 0-No pain         Complications: No notable events documented.

## 2023-12-30 NOTE — Progress Notes (Signed)
 Post procedure, the patient called out with symptoms of elevated heart rate. Patient stated around 1415 that she could feel her heart pounding in her chest. This RN paged provider Ganji,MD and informed him of patients current change in condition. EKG was completed and showed AFIB w/RVR with heart rate sustaining in the 130s-140s. Patient was admitted to hospital on 6 east. Report was called. Family has been updated

## 2023-12-30 NOTE — Interval H&P Note (Signed)
 History and Physical Interval Note:  12/30/2023 11:20 AM  [name]  has presented today for surgery, with the diagnosis of MR.  The various methods of treatment have been discussed with the patient and family. After consideration of risks, benefits and other options for treatment, the patient has consented to  Procedure(s): TRANSESOPHAGEAL ECHOCARDIOGRAM (N/A) as a surgical intervention.  The patient's history has been reviewed, patient examined, no change in status, stable for surgery.  I have reviewed the patient's chart and labs.  Questions were answered to the patient's satisfaction.   Reattested H+P due to wrong note selected for linking.   Parke Poisson

## 2023-12-30 NOTE — Interval H&P Note (Signed)
 History and Physical Interval Note:  12/30/2023 7:35 AM  [name]  has presented today for surgery, with the diagnosis of MR.  The various methods of treatment have been discussed with the patient and family. After consideration of risks, benefits and other options for treatment, the patient has consented to  Procedure(s): TRANSESOPHAGEAL ECHOCARDIOGRAM (N/A) as a surgical intervention.  The patient's history has been reviewed, patient examined, no change in status, stable for surgery.  I have reviewed the patient's chart and labs.  Questions were answered to the patient's satisfaction.     Parke Poisson

## 2023-12-30 NOTE — Progress Notes (Signed)
 Patient arrived to cath lab holding bay 19. 66fr sheath intact with no bleeding or hematoma present. VSS. Report received from Lance Bosch, [facility]. #7 fr sheath removed from right femoral artery. Manual pressure applied x 20 min per [employer], RN. Post activity and precautions explained. Will continue to monitor site per orders received.[name]

## 2023-12-30 NOTE — Progress Notes (Signed)
   12/30/23 1540  Assess: MEWS Score  Temp 97.6 F (36.4 C)  BP 105/71  MAP (mmHg) 79  Pulse Rate (!) 149  ECG Heart Rate (!) 154  Resp 20  SpO2 95 %  O2 Device Room Air  Assess: MEWS Score  MEWS Temp 0  MEWS Systolic 0  MEWS Pulse 3  MEWS RR 0  MEWS LOC 0  MEWS Score 3  MEWS Score Color Yellow  Assess: if the MEWS score is Yellow or Red  Were vital signs accurate and taken at a resting state? Yes  Does the patient meet 2 or more of the SIRS criteria? No  MEWS guidelines implemented  Yes, yellow  Treat  MEWS Interventions Considered administering scheduled or prn medications/treatments as ordered  Take Vital Signs  Increase Vital Sign Frequency  Yellow: Q2hr x1, continue Q4hrs until patient remains green for 12hrs  Escalate  MEWS: Escalate Yellow: Discuss with charge nurse and consider notifying provider and/or RRT  Notify: Charge Nurse/RN  Name of Charge Nurse/RN Notified Stephaine  Assess: SIRS CRITERIA  SIRS Temperature  0  SIRS Respirations  0  SIRS Pulse 1  SIRS WBC 0  SIRS Score Sum  1

## 2023-12-31 ENCOUNTER — Other Ambulatory Visit (HOSPITAL_COMMUNITY): Payer: Self-pay

## 2023-12-31 DIAGNOSIS — Z8542 Personal history of malignant neoplasm of other parts of uterus: Secondary | ICD-10-CM | POA: Diagnosis not present

## 2023-12-31 DIAGNOSIS — Q228 Other congenital malformations of tricuspid valve: Secondary | ICD-10-CM | POA: Diagnosis not present

## 2023-12-31 DIAGNOSIS — I4891 Unspecified atrial fibrillation: Secondary | ICD-10-CM | POA: Diagnosis not present

## 2023-12-31 DIAGNOSIS — I10 Essential (primary) hypertension: Secondary | ICD-10-CM | POA: Diagnosis not present

## 2023-12-31 DIAGNOSIS — I340 Nonrheumatic mitral (valve) insufficiency: Secondary | ICD-10-CM | POA: Diagnosis not present

## 2023-12-31 DIAGNOSIS — E7800 Pure hypercholesterolemia, unspecified: Secondary | ICD-10-CM | POA: Diagnosis not present

## 2023-12-31 DIAGNOSIS — I1310 Hypertensive heart and chronic kidney disease without heart failure, with stage 1 through stage 4 chronic kidney disease, or unspecified chronic kidney disease: Secondary | ICD-10-CM | POA: Diagnosis not present

## 2023-12-31 DIAGNOSIS — N1831 Chronic kidney disease, stage 3a: Secondary | ICD-10-CM | POA: Diagnosis not present

## 2023-12-31 DIAGNOSIS — Z79899 Other long term (current) drug therapy: Secondary | ICD-10-CM | POA: Diagnosis not present

## 2023-12-31 DIAGNOSIS — E785 Hyperlipidemia, unspecified: Secondary | ICD-10-CM | POA: Diagnosis not present

## 2023-12-31 LAB — BASIC METABOLIC PANEL
Anion gap: 8 (ref 5–15)
BUN: 10 mg/dL (ref 8–23)
CO2: 23 mmol/L (ref 22–32)
Calcium: 8 mg/dL — ABNORMAL LOW (ref 8.9–10.3)
Chloride: 105 mmol/L (ref 98–111)
Creatinine, Ser: 0.81 mg/dL (ref 0.44–1.00)
GFR, Estimated: >60 mL/min (ref >60)
Glucose, Bld: 102 mg/dL — ABNORMAL HIGH (ref 70–99)
Potassium: 3.5 mmol/L (ref 3.5–5.1)
Sodium: 136 mmol/L (ref 135–145)

## 2023-12-31 LAB — CBC
HCT: 29 % — ABNORMAL LOW (ref 36.0–46.0)
Hemoglobin: 9.5 g/dL — ABNORMAL LOW (ref 12.0–15.0)
MCH: 27.8 pg (ref 26.0–34.0)
MCHC: 32.8 g/dL (ref 30.0–36.0)
MCV: 84.8 fL (ref 80.0–100.0)
Platelets: 133 K/uL — ABNORMAL LOW (ref 150–400)
RBC: 3.42 MIL/uL — ABNORMAL LOW (ref 3.87–5.11)
RDW: 16.8 % — ABNORMAL HIGH (ref 11.5–15.5)
WBC: 6.3 K/uL (ref 4.0–10.5)
nRBC: 0 % (ref 0.0–0.2)

## 2023-12-31 MED ORDER — AMIODARONE HCL 200 MG PO TABS
400.0000 mg | ORAL_TABLET | Freq: Two times a day (BID) | ORAL | Status: DC
  Administered 2023-12-31: 400 mg via ORAL
  Filled 2023-12-31: qty 2

## 2023-12-31 MED ORDER — AMIODARONE HCL 200 MG PO TABS
ORAL_TABLET | ORAL | 1 refills | Status: DC
  Filled 2023-12-31: qty 90, 79d supply, fill #0

## 2023-12-31 MED ORDER — APIXABAN 5 MG PO TABS
5.0000 mg | ORAL_TABLET | Freq: Two times a day (BID) | ORAL | 11 refills | Status: DC
  Filled 2023-12-31: qty 60, 30d supply, fill #0

## 2023-12-31 NOTE — Care Management Obs Status (Signed)
 MEDICARE OBSERVATION STATUS NOTIFICATION   Patient Details  Name: [name] MRN: [id number] Date of Birth: 1941/10/03   Medicare Observation Status Notification Given:  Yes    Pegi Milazzo G., RN 12/31/2023, 2:15 PM

## 2023-12-31 NOTE — Progress Notes (Signed)
 Rounding Note    Patient Name: [name] Date of Encounter: 12/31/2023  [facility] Cardiologist: Yates Decamp, MD   Subjective   She feels well today and ready to go home She is back in sinus rhythm  Inpatient Medications    Scheduled Meds:  apixaban  5 mg Oral BID   atorvastatin  10 mg Oral Daily   ezetimibe  10 mg Oral Daily   metoprolol succinate  25 mg Oral BID   pantoprazole  40 mg Oral Daily   Continuous Infusions:  amiodarone 30 mg/hr (12/31/23 0441)   PRN Meds: acetaminophen, loperamide, ondansetron (ZOFRAN) IV, sodium chloride flush   Vital Signs    Vitals:   12/30/23 2355 12/31/23 0420 12/31/23 0908 12/31/23 1046  BP: (!) 102/51 (!) 100/50 (!) 105/55 113/63  Pulse: 65 64  65  Resp: 16 20 16    Temp: 98.1 F (36.7 C) 98.3 F (36.8 C) 98.1 F (36.7 C)   TempSrc: Oral Oral Oral   SpO2: 96% 94%    Weight:      Height:        Intake/Output Summary (Last 24 hours) at 12/31/2023 1258 Last data filed at 12/31/2023 1144 Gross per 24 hour  Intake 108.45 ml  Output 601 ml  Net -492.55 ml      12/30/2023    3:40 PM 12/30/2023    6:51 AM 12/27/2023    2:21 PM  Last 3 Weights  Weight (lbs) 152 lb 8 oz 156 lb 156 lb 3.2 oz  Weight (kg) 69.174 kg 70.761 kg 70.852 kg      Telemetry    Sinus rhythm- Personally Reviewed  ECG    No new- Personally Reviewed  Physical Exam   Vitals:   12/31/23 0908 12/31/23 1046  BP: (!) 105/55 113/63  Pulse:  65  Resp: 16   Temp: 98.1 F (36.7 C)   SpO2:      GEN: No acute distress.   Neck: No JVD Cardiac: RRR, mild holosystolic murmur Respiratory: Clear to auscultation bilaterally. GI: Soft, nontender, non-distended  MS: No edema; No deformity. Neuro:  Nonfocal  Psych: Normal affect   Labs    High Sensitivity Troponin:  No results for input(s): "TROPONINIHS" in the last 720 hours.   Chemistry Recent Labs  Lab 12/30/23 1543 12/31/23 0430  NA 140 136  K 3.5 3.5  CL 108 105  CO2 22 23   GLUCOSE 104* 102*  BUN 12 10  CREATININE 0.76 0.81  CALCIUM 8.6* 8.0*  MG 2.0  --   PROT 6.0*  --   ALBUMIN 3.5  --   AST 26  --   ALT 16  --   ALKPHOS 29*  --   BILITOT 0.7  --   GFRNONAA >60 >60  ANIONGAP 10 8    Lipids No results for input(s): "CHOL", "TRIG", "HDL", "LABVLDL", "LDLCALC", "CHOLHDL" in the last 168 hours.  Hematology Recent Labs  Lab 12/30/23 1543 12/31/23 0430  WBC 6.7 6.3  RBC 4.09 3.42*  HGB 11.6* 9.5*  HCT 35.1* 29.0*  MCV 85.8 84.8  MCH 28.4 27.8  MCHC 33.0 32.8  RDW 16.7* 16.8*  PLT 162 133*   Thyroid  Recent Labs  Lab 12/30/23 1543  TSH 3.482    BNPNo results for input(s): "BNP", "PROBNP" in the last 168 hours.  DDimer No results for input(s): "DDIMER" in the last 168 hours.   Radiology    CARDIAC CATHETERIZATION Result Date: 12/30/2023 Images  from the original result were not included. Right & Left Heart Catheterization 12/30/23: Hemodynamic data: Right heart catheterization consistent with mild pulmonary hypertension with normal LVEDP, prominent V waves on wedge pressure tracing suggests significant MR, both right upper pulmonary and left upper pulmonary veins were interrogated, approximate A wave 12 mmHg and V waves 22 mmHg. QP/QS 1.00, PAPi 3.0 suggest preserved RV function. CO 7.76, CI 4.41, elevated due to significant MR probably. LVEDP 11 mmHg, no pressure differential between aorta and LV, no suggestion of aortic stenosis. Angiographic data: RCA: Superdominant vessel.  It is tortuous in the midsegment.  Otherwise smooth and normal. LM: Large-caliber vessel smooth and normal. LAD: Ends before reaching the LV apex, gives origin to large D1 with secondary branches, has minimal disease in the midsegment. LCx: Small vessel, small OM1. Impression and recommendations: Findings suggestive of severe MR.  Will await on formal reports on TEE and make final recommendations.   EP STUDY Result Date: 12/30/2023 See surgical note for  result.   Cardiac Studies   Relevant CV Studies: Right & Left Heart Catheterization 12/30/23: Hemodynamic data: Right heart catheterization consistent with mild pulmonary hypertension with normal LVEDP, prominent V waves on wedge pressure tracing suggests significant MR, both right upper pulmonary and left upper pulmonary veins were interrogated, approximate A wave 12 mmHg and V waves 22 mmHg. QP/QS 1.00, PAPi 3.0 suggest preserved RV function. CO 7.76, CI 4.41, elevated due to significant MR probably. LVEDP 11 mmHg, no pressure differential between aorta and LV, no suggestion of aortic stenosis.   Angiographic data: RCA: Superdominant vessel.  It is tortuous in the midsegment.  Otherwise smooth and normal. LM: Large-caliber vessel smooth and normal. LAD: Ends before reaching the LV apex, gives origin to large D1 with secondary branches, has minimal disease in the midsegment. LCx: Small vessel, small OM1.      Impression and recommendations: Findings suggestive of severe MR.  Will await on formal reports on TEE and make final recommendations.  Echocardiogram 11/14/2023 1. Left ventricular ejection fraction, by estimation, is 60 to 65%. The  left ventricle has normal function. The left ventricle has no regional  wall motion abnormalities. The left ventricular internal cavity size was  mildly dilated. Left ventricular  diastolic parameters are indeterminate. The average left ventricular  global longitudinal strain is -23.3 %. The global longitudinal strain is  normal.   2. Right ventricular systolic function is normal. The right ventricular  size is mildly enlarged. There is moderately elevated pulmonary artery  systolic pressure. The estimated right ventricular systolic pressure is  48.2 mmHg.   3. Left atrial size was severely dilated.   4. Right atrial size was mild to moderately dilated.   5. The mitral valve is degenerative. Severe mitral valve regurgitation.  There is  prolapse of both leaflets of the mitral valve.   6. The tricuspid valve is degenerative. Tricuspid valve regurgitation is  moderate.   7. The aortic valve is normal in structure. Aortic valve regurgitation is  mild. Aortic valve sclerosis is present (LCC less mobile), with no  evidence of aortic valve stenosis.   8. The inferior vena cava is normal in size with <50% respiratory  variability, suggesting right atrial pressure of 8 mmHg.   Comparison(s): No prior Echocardiogram.  TEE completed no read    Patient Profile     [name] is a 83 y.o. female with severe MR, iron deficiency anemia, endometrial cancer 2009 treated with radiation/surgery, hypertension, hyperlipidemia, gastritis 2019 who  is being seen 12/30/2023 for the evaluation of new onset atrial fibrillation.   Assessment & Plan    New onset atrial fibrillation Has severely dilated LA She converted to sinus rhythm.  The plan was for her to be cardioverted on Monday.  This is no longer necessary. -Will change IV amiodarone to oral.  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  More significant prolapse of the posterior leaflet. There are multiple jets.  Has significant max velocity and systolic flow reversal in pulmonary veins consistent with severe MR. Had left heart cath with no obstructive lesions.  Right heart cath showed prominent V wave, increased cardiac output. -Planned  for outpatient evaluation for MVR with Dr. Leafy Ro  Iron deficiency anemia Currently receiving iron transfusions for heme oncologist.  Hemoglobin 11.1 to 9.5.  Can follow-up with hematology and recommend outpatient surveillance.  Hyperlipidemia She was on Nexlizet, but transitioned  to atorvastatin 10 mg (history of myalgias), Zetia.  Hypertension Well-controlled.  On Toprol-XL as above and Maxide 37.5/25 mg (hold) at home.  Otherwise she is doing well and asymptomatic.  We will discharge her today.  For questions or updates, please contact [facility] Please consult [email or web address] for contact info under        Signed, Maisie Fus, MD  12/31/2023, 12:58 PM

## 2023-12-31 NOTE — Care Management Obs Status (Signed)
 MEDICARE OBSERVATION STATUS NOTIFICATION   Patient Details  Name: [name] MRN: [id number] Date of Birth: 1941-10-12   Medicare Observation Status Notification Given:  Yes    Cordae Mccarey G., RN 12/31/2023, 8:32 AM

## 2023-12-31 NOTE — Care Management CC44 (Signed)
 Condition Code 44 Documentation Completed  Patient Details  Name: [name] MRN: [id number] Date of Birth: October 16, 1941   Condition Code 44 given:   yes Patient signature on Condition Code 44 notice:   yes Documentation of 2 MD's agreement:   yes Code 44 added to claim:   yes    Ottis Vacha G., RN 12/31/2023, 2:15 PM

## 2023-12-31 NOTE — Discharge Summary (Cosign Needed Addendum)
 Discharge Summary    Patient ID: [name] MRN: [id number]; DOB: Jul 23, 1941  Admit date: 12/30/2023 Discharge date: 12/31/2023  PCP:  Merri Brunette, MD   [facility] Providers Cardiologist:  Yates Decamp, MD      Discharge Diagnoses    Principal Problem:   Atrial fibrillation with RVR [facility]) Active Problems:   Nonrheumatic mitral valve regurgitation   Mitral regurgitation    Diagnostic Studies/Procedures    TEE 12/30/23 1. Left ventricular ejection fraction, by estimation, is 60 to 65%. The  left ventricle has normal function.   2. Right ventricular systolic function is normal. The right ventricular  size is normal.   3. Left atrial size was severely dilated. No left atrial/left atrial  appendage thrombus was detected.   4. 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  Valve is degenerative and myxomatous, with bileaflet prolapse, posterior >  anterior. There is more prominent P1 scallop prolapse with anterior  leaflet override. There is prolapse of A2   scallop as well. The mitral valve is myxomatous. Severe mitral valve  regurgitation. No evidence of mitral stenosis. The mean mitral valve  gradient is 2.0 mmHg with average heart rate of 71 bpm.   5. Tricuspid valve is mildly thickened and likely has degenerative  change. No significant prolapse noted. Tricuspid valve regurgitation is  moderate.   6. The aortic valve is tricuspid. There is mild calcification of the  aortic valve. There is mild thickening of the aortic valve. Aortic valve  regurgitation is trivial. No aortic stenosis is present.   7. Possible tiny PFO with left to right shunt by color flow Doppler.  Agitated saline contrast bubble study was negative, with no evidence of  any left to right interatrial shunt.   8. There is Moderate (Grade III) plaque involving the aortic arch and  descending aorta.   9. 3D performed of mitral valve, tricuspid valve and demonstrates   myxomatous mitral valve with prolapse.  10. Rhythm strip during this exam demonstrates normal sinus rhythm.   Right and Left Heart Catheterization 12/30/23 Right & Left Heart Catheterization 12/30/23: Hemodynamic data: Right heart catheterization consistent with mild pulmonary hypertension with normal LVEDP, prominent V waves on wedge pressure tracing suggests significant MR, both right upper pulmonary and left upper pulmonary veins were interrogated, approximate A wave 12 mmHg and V waves 22 mmHg. QP/QS 1.00, PAPi 3.0 suggest preserved RV function. CO 7.76, CI 4.41, elevated due to significant MR probably. LVEDP 11 mmHg, no pressure differential between aorta and LV, no suggestion of aortic stenosis.   Angiographic data: RCA: Superdominant vessel.  It is tortuous in the midsegment.  Otherwise smooth and normal. LM: Large-caliber vessel smooth and normal. LAD: Ends before reaching the LV apex, gives origin to large D1 with secondary branches, has minimal disease in the midsegment. LCx: Small vessel, small OM1.      Impression and recommendations: Findings suggestive of severe MR.  Will await on formal reports on TEE and make final recommendations. _____________   History of Present Illness     [name] is a 83 y.o. female with a past medical history of severe MR, iron deficiency anemia, endometrial cancer in 2009 treated with radiation/surgery, HTN, HLD, gastritis. Patient is currently undergoing workup for severe mitral regurgitation.  On 2/28, she presented to [facility] for scheduled TEE and cardiac catheterization.  Catheterization showed mild pulmonary hypertension with prominent V waves, approximate A-wave 12, V wave 22.  QP/QS ratio 1.  Preserved RV function with preserved cardiac output.  LVEDP 11.  No significant CAD.  Postoperatively after her catheterization, she went into atrial fibrillation RVR with heart rates between 130-150, generally asymptomatic with mild complaints of  palpitations.   In speaking with the patient she believes that she might have been in A-fib previously.  She reports that her PCP had previously ordered a heart monitor for palpitations going on for the past year however. I do not see any viewable results in the chart to confirm these findings.  Otherwise, she does very well at home and works at a [employer] school [employer] and constantly moving around, works over 40+ hours each week.  Does not report any shortness of breath with this activity however for the last 6 months she does report having some increased shortness of breath when walking up steps.  Also has had mild peripheral edema.  Otherwise no chest pain, dizziness, lightheadedness, syncope, falls.  After patient converted to afib with RVR, decision was made to admit her for observation.     Hospital Course     Consultants: None  New onset atrial fibrillation - After catheterization, patient converted to afib with RVR, HR in the 120s-150s. Admitted for observation. Started on eliquis 5 mg BID and IV amiodarone - K 3.5, mag 2.0, TSH 3.482, AST 26 and ALT 16  - She converted to NSR on IV amio. Transitioned from IV amiodarone to PO amiodarone. Continue amiodarone 400 mg BID for 10 days followed by 200 mg daily - Continue eliquis 5 mg BID. Discussed importance of monitoring for bleeding after starting a blood thinner  - Continue metoprolol succinate 25 mg BID. Patient educated that she could take additional metoprolol as needed for palpitations  - Consider outpatient sleep study. Discussed afib triggers including caffeine, alcohol, URI   Severe MR  - Echocardiogram from 11/14/23 showed EF 60-65%, no regional wall motion abnormalities, normal RV systolic function, severe left atrial dilation, severe mitral valve regurgitation with prolapse of both leaflets - Presented on 2/28 for scheduled TEE and cardiac catheterization as part of MR workup. TEE showed LVEF 60-65%, moderate TR. There was  central, commissural severe MR with mixed mitral valve pathology. Valve is degenerative and myxomatous, with bileaflet prolapse, posterior > anterior.. Cath yesterday showed findings suggestive of severe MR, no significant CAD - Discussed radial and femoral cath site care and provided written instructions on AVS  - Continue outpatient work up. Scheduled to see Dr. Leafy Ro with CT surgery on 3/13  Iron Deficiency anemia  - Patient has chronic anemia requiring iron transfusions. She is followed by heme onc  - Recommend CBC at outpatient follow up now that patient is on eliquis   HLD  - Continue lipitor 10 mg daily and zetia 10 mg daily   HTN  - Continue metoprolol succinate 25 mg BID  - Patient's home maxzide was held on admission due to low BP. While off the maxzide, her BP remained low-normal, in the 100s-110s systolic. Hold maxzide at DC   Patient was seen and examined by Dr. Wyline Mood and was deemed stable for DC. She has an appointment with general cardiology on 3/14 and an appointment with CT surgery on 3/13   Did the patient have an acute coronary syndrome (MI, NSTEMI, STEMI, etc) this admission?:  No                               Did  the patient have a percutaneous coronary intervention (stent / angioplasty)?:  No.     _____________  Discharge Vitals Blood pressure 113/63, pulse 65, temperature 98.1 F (36.7 C), temperature source Oral, resp. rate 16, height 5\' 4"  (1.626 m), weight 69.2 kg, SpO2 94%.  Filed Weights   12/30/23 0651 12/30/23 1540  Weight: 70.8 kg 69.2 kg    Labs & Radiologic Studies    CBC Recent Labs    12/30/23 1543 12/31/23 0430  WBC 6.7 6.3  HGB 11.6* 9.5*  HCT 35.1* 29.0*  MCV 85.8 84.8  PLT 162 133*   Basic Metabolic Panel Recent Labs    16/10/96 1543 12/31/23 0430  NA 140 136  K 3.5 3.5  CL 108 105  CO2 22 23  GLUCOSE 104* 102*  BUN 12 10  CREATININE 0.76 0.81  CALCIUM 8.6* 8.0*  MG 2.0  --    Liver Function Tests Recent Labs     12/30/23 1543  AST 26  ALT 16  ALKPHOS 29*  BILITOT 0.7  PROT 6.0*  ALBUMIN 3.5   No results for input(s): "LIPASE", "AMYLASE" in the last 72 hours. High Sensitivity Troponin:   No results for input(s): "TROPONINIHS" in the last 720 hours.  BNP Invalid input(s): "POCBNP" D-Dimer No results for input(s): "DDIMER" in the last 72 hours. Hemoglobin A1C No results for input(s): "HGBA1C" in the last 72 hours. Fasting Lipid Panel No results for input(s): "CHOL", "HDL", "LDLCALC", "TRIG", "CHOLHDL", "LDLDIRECT" in the last 72 hours. Thyroid Function Tests Recent Labs    12/30/23 1543  TSH 3.482   _____________  CARDIAC CATHETERIZATION Result Date: 12/30/2023 Images from the original result were not included. Right & Left Heart Catheterization 12/30/23: Hemodynamic data: Right heart catheterization consistent with mild pulmonary hypertension with normal LVEDP, prominent V waves on wedge pressure tracing suggests significant MR, both right upper pulmonary and left upper pulmonary veins were interrogated, approximate A wave 12 mmHg and V waves 22 mmHg. QP/QS 1.00, PAPi 3.0 suggest preserved RV function. CO 7.76, CI 4.41, elevated due to significant MR probably. LVEDP 11 mmHg, no pressure differential between aorta and LV, no suggestion of aortic stenosis. Angiographic data: RCA: Superdominant vessel.  It is tortuous in the midsegment.  Otherwise smooth and normal. LM: Large-caliber vessel smooth and normal. LAD: Ends before reaching the LV apex, gives origin to large D1 with secondary branches, has minimal disease in the midsegment. LCx: Small vessel, small OM1. Impression and recommendations: Findings suggestive of severe MR.  Will await on formal reports on TEE and make final recommendations.   EP STUDY Result Date: 12/30/2023 See surgical note for result.  Disposition   Pt is being discharged home today in good condition.  Follow-up Plans & Appointments     Discharge Instructions      Diet - low sodium heart healthy   Complete by: As directed    Discharge instructions   Complete by: As directed    Groin Site Care Refer to this sheet in the next few weeks. These instructions provide you with information on caring for yourself after your procedure. Your caregiver may also give you more specific instructions. Your treatment has been planned according to current medical practices, but problems sometimes occur. Call your caregiver if you have any problems or questions after your procedure. HOME CARE INSTRUCTIONS You may shower 24 hours after the procedure. Remove the bandage (dressing) and gently wash the site with plain soap and water. Gently pat the site dry.  Do  not apply powder or lotion to the site.  Do not sit in a bathtub, swimming pool, or whirlpool for 5 to 7 days.  No bending, squatting, or lifting anything over 10 pounds (4.5 kg) as directed by your caregiver.  Inspect the site at least twice daily.  Do not drive home if you are discharged the same day of the procedure. Have someone else drive you.  You may drive 24 hours after the procedure unless otherwise instructed by your caregiver.  What to expect: Any bruising will usually fade within 1 to 2 weeks.  Blood that collects in the tissue (hematoma) may be painful to the touch. It should usually decrease in size and tenderness within 1 to 2 weeks.  SEEK IMMEDIATE MEDICAL CARE IF: You have unusual pain at the groin site or down the affected leg.  You have redness, warmth, swelling, or pain at the groin site.  You have drainage (other than a small amount of blood on the dressing).  You have chills.  You have a fever or persistent symptoms for more than 72 hours.  You have a fever and your symptoms suddenly get worse.  Your leg becomes pale, cool, tingly, or numb.  You have heavy bleeding from the site. Hold pressure on the site. .   Radial Site Care Refer to this sheet in the next few weeks. These  instructions provide you with information on caring for yourself after your procedure. Your caregiver may also give you more specific instructions. Your treatment has been planned according to current medical practices, but problems sometimes occur. Call your caregiver if you have any problems or questions after your procedure. HOME CARE INSTRUCTIONS You may shower the day after the procedure. Remove the bandage (dressing) and gently wash the site with plain soap and water. Gently pat the site dry.  Do not apply powder or lotion to the site.  Do not submerge the affected site in water for 3 to 5 days.  Inspect the site at least twice daily.  Do not flex or bend the affected arm for 24 hours.  No lifting over 5 pounds (2.3 kg) for 5 days after your procedure.  Do not drive home if you are discharged the same day of the procedure. Have someone else drive you.  You may drive 24 hours after the procedure unless otherwise instructed by your caregiver.  What to expect: Any bruising will usually fade within 1 to 2 weeks.  Blood that collects in the tissue (hematoma) may be painful to the touch. It should usually decrease in size and tenderness within 1 to 2 weeks.  SEEK IMMEDIATE MEDICAL CARE IF: You have unusual pain at the radial site.  You have redness, warmth, swelling, or pain at the radial site.  You have drainage (other than a small amount of blood on the dressing).  You have chills.  You have a fever or persistent symptoms for more than 72 hours.  You have a fever and your symptoms suddenly get worse.  Your arm becomes pale, cool, tingly, or numb.  You have heavy bleeding from the site. Hold pressure on the site.   Increase activity slowly   Complete by: As directed         Discharge Medications   Allergies as of 12/31/2023       Reactions   Crestor [rosuvastatin] Other (See Comments)   Leg cramps   Glycopyrrolate    Other reaction(s): extreme dry mouth   Codeine Other (See  Comments)   Hyper        Medication List     STOP taking these medications    triamterene-hydrochlorothiazide 37.5-25 MG tablet Commonly known as: MAXZIDE-25       TAKE these medications    amiodarone 200 MG tablet Commonly known as: PACERONE Take 2 tablets (400 mg) twice daily until 3/11. Then, take 1 tablet (200 mg) daily   apixaban 5 MG Tabs tablet Commonly known as: ELIQUIS Take 1 tablet (5 mg total) by mouth 2 (two) times daily.   atorvastatin 10 MG tablet Commonly known as: LIPITOR Take 1 tablet (10 mg total) by mouth daily.   b complex vitamins tablet Take 1 tablet by mouth daily.   CALCIUM PO Take 650 mg by mouth daily.   ezetimibe 10 MG tablet Commonly known as: ZETIA Take 1 tablet (10 mg total) by mouth daily.   Garlic 1000 MG Caps Take 1,000 mg by mouth daily.   Krill Oil Omega-3 300 MG Caps Take 300 mg by mouth daily.   loperamide 2 MG capsule Commonly known as: IMODIUM Take 1 capsule (2 mg total) by mouth 4 (four) times daily as needed for diarrhea or loose stools.   metoprolol succinate 25 MG 24 hr tablet Commonly known as: TOPROL-XL Take 25 mg by mouth 2 (two) times daily.  Take additional tablet for palpitations   multivitamins ther. w/minerals Tabs tablet Take 1 tablet by mouth daily.   Nexlizet 180-10 MG Tabs Generic drug: Bempedoic Acid-Ezetimibe Take 1 tablet by mouth daily.   pantoprazole 40 MG tablet Commonly known as: PROTONIX Take 40 mg by mouth daily.   Prolia 60 MG/ML Sosy injection Generic drug: denosumab Inject 60 mg into the skin every 6 (six) months.   Vitamin D3 125 MCG (5000 UT) Caps Take 5,000 Units by mouth daily.   Zinc 50 MG Tabs Take 50 mg by mouth daily.           Outstanding Labs/Studies    Duration of Discharge Encounter: APP Time: 15 minutes   Signed, Jonita Albee, PA-C 12/31/2023, 1:53 PM

## 2023-12-31 NOTE — Discharge Instructions (Signed)

## 2024-01-01 LAB — ECHO TEE
MV M vel: 5.26 m/s
MV Peak grad: 110.7 mmHg
Radius: 0.75 cm

## 2024-01-02 LAB — POCT I-STAT EG7
Acid-base deficit: 3 mmol/L — ABNORMAL HIGH (ref 0.0–2.0)
Acid-base deficit: 3 mmol/L — ABNORMAL HIGH (ref 0.0–2.0)
Bicarbonate: 22.7 mmol/L (ref 20.0–28.0)
Bicarbonate: 22.9 mmol/L (ref 20.0–28.0)
Calcium, Ion: 1.16 mmol/L (ref 1.15–1.40)
Calcium, Ion: 1.17 mmol/L (ref 1.15–1.40)
HCT: 28 % — ABNORMAL LOW (ref 36.0–46.0)
HCT: 28 % — ABNORMAL LOW (ref 36.0–46.0)
Hemoglobin: 9.5 g/dL — ABNORMAL LOW (ref 12.0–15.0)
Hemoglobin: 9.5 g/dL — ABNORMAL LOW (ref 12.0–15.0)
O2 Saturation: 70 %
O2 Saturation: 70 %
Potassium: 3.8 mmol/L (ref 3.5–5.1)
Potassium: 3.8 mmol/L (ref 3.5–5.1)
Sodium: 141 mmol/L (ref 135–145)
Sodium: 142 mmol/L (ref 135–145)
TCO2: 24 mmol/L (ref 22–32)
TCO2: 24 mmol/L (ref 22–32)
pCO2, Ven: 41.5 mmHg — ABNORMAL LOW (ref 44–60)
pCO2, Ven: 41.5 mmHg — ABNORMAL LOW (ref 44–60)
pH, Ven: 7.347 (ref 7.25–7.43)
pH, Ven: 7.349 (ref 7.25–7.43)
pO2, Ven: 39 mmHg (ref 32–45)
pO2, Ven: 39 mmHg (ref 32–45)

## 2024-01-02 LAB — POCT I-STAT 7, (LYTES, BLD GAS, ICA,H+H)
Acid-base deficit: 2 mmol/L (ref 0.0–2.0)
Bicarbonate: 22.2 mmol/L (ref 20.0–28.0)
Calcium, Ion: 1.21 mmol/L (ref 1.15–1.40)
HCT: 29 % — ABNORMAL LOW (ref 36.0–46.0)
Hemoglobin: 9.9 g/dL — ABNORMAL LOW (ref 12.0–15.0)
O2 Saturation: 90 %
Potassium: 4 mmol/L (ref 3.5–5.1)
Sodium: 141 mmol/L (ref 135–145)
TCO2: 23 mmol/L (ref 22–32)
pCO2 arterial: 36.7 mmHg (ref 32–48)
pH, Arterial: 7.39 (ref 7.35–7.45)
pO2, Arterial: 60 mmHg — ABNORMAL LOW (ref 83–108)

## 2024-01-02 LAB — LIPOPROTEIN A (LPA): Lipoprotein (a): 107.6 nmol/L — ABNORMAL HIGH (ref <75.0)

## 2024-01-04 ENCOUNTER — Encounter: Payer: Self-pay | Admitting: Cardiology

## 2024-01-09 ENCOUNTER — Encounter: Payer: Self-pay | Admitting: Cardiology

## 2024-01-11 ENCOUNTER — Ambulatory Visit (HOSPITAL_COMMUNITY): Admission: RE | Admit: 2024-01-11 | Payer: Medicare HMO | Source: Ambulatory Visit

## 2024-01-11 NOTE — Progress Notes (Unsigned)
 [address]       [place] [address]             [phone]           [facility] Medical Record #[id number] Date of Birth: 1941/08/02  Yates Decamp, MD Merri Brunette, MD  Chief Complaint: Increasing SOB and MR    History of Present Illness:     Pt is a very pleasant 83 yo female who has been followed for MR over the past few years which started off as mild and recently been found to have severe MR. She gets SOB with activity and has noted increasing lower ext edema. She was seen and felt best to be served with surgical intervention and had TEE with confirmation of severe MR from posterior leaflet prolapse/flail and moderate TR with an EF of 60%. She underwent cath with no CAD and mild PHTN. She then developed afib post cath and was admitted and chemically cardioverted and started on eliquis. She denies CP or lightheadedness     Past Medical History:  Diagnosis Date   Anemia    Arthritis    Cataract    Diverticulosis of colon    Endometrial cancer (HCC)    1A grade endometrial ca   Essential hypertension 01/24/2009   Qualifier: Diagnosis of  By: Nelson-Smith CMA (AAMA), Dottie     Hx of radiation therapy 04/24/08-05/29/08& 8/12/,8/19,&06/27/08   external beam  through 7/09 ,then intracavity in 06/2008   Hypertension    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    Past Surgical History:  Procedure Laterality Date   APPENDECTOMY     1967   arthroscopic knee right surgery     ECTOPIC PREGNANCY SURGERY  1967   iron infusion  12/2022   LAPAROSCOPIC HYSTERECTOMY     total   ORTHOPEDIC SURGERY     Orthoscopic Knee Surgery   RIGHT/LEFT HEART CATH AND CORONARY ANGIOGRAPHY N/A 12/30/2023   Procedure: RIGHT/LEFT HEART CATH AND CORONARY ANGIOGRAPHY;  Surgeon: Yates Decamp, MD;  Location: [facility] INVASIVE CV LAB;  Service: Cardiovascular;  Laterality: N/A;   TEAR DUCT PROBING     TOOTH EXTRACTION     bone graft   TRANSESOPHAGEAL ECHOCARDIOGRAM (CATH LAB) N/A  12/30/2023   Procedure: TRANSESOPHAGEAL ECHOCARDIOGRAM;  Surgeon: Parke Poisson, MD;  Location: [facility] INVASIVE CV LAB;  Service: Cardiovascular;  Laterality: N/A;    Social History   Tobacco Use  Smoking Status Never  Smokeless Tobacco Never    Social History   Substance and Sexual Activity  Alcohol Use Yes   Alcohol/week: 0.0 standard drinks of alcohol   Comment: less then 2 a month    Social History   Socioeconomic History   Marital status: Married    Spouse name: Not on file   Number of children: 1   Years of education: Not on file   Highest education level: Not on file  Occupational History   Occupation: Environmental health practitioner  Tobacco Use   Smoking status: Never   Smokeless tobacco: Never  Vaping Use   Vaping status: Never Used  Substance and Sexual Activity   Alcohol use: Yes    Alcohol/week: 0.0 standard drinks of alcohol    Comment: less then 2 a month   Drug use: No   Sexual activity: Never  Other Topics Concern   Not on file  Social History Narrative   Not on file   Social Drivers of Health   Financial  Resource Strain: Not on file  Food Insecurity: No Food Insecurity (12/30/2023)   Hunger Vital Sign    Worried About Running Out of Food in the Last Year: Never true    Ran Out of Food in the Last Year: Never true  Transportation Needs: No Transportation Needs (12/30/2023)   PRAPARE - Administrator, Civil Service (Medical): No    Lack of Transportation (Non-Medical): No  Physical Activity: Not on file  Stress: Not on file  Social Connections: Socially Integrated (12/30/2023)   Social Connection and Isolation Panel [NHANES]    Frequency of Communication with Friends and Family: More than three times a week    Frequency of Social Gatherings with Friends and Family: More than three times a week    Attends Religious Services: More than 4 times per year    Active Member of [employer] or Organizations: Yes    Attends Banker  Meetings: More than 4 times per year    Marital Status: Married  Catering manager Violence: Not At Risk (12/30/2023)   Humiliation, Afraid, Rape, and Kick questionnaire    Fear of Current or Ex-Partner: No    Emotionally Abused: No    Physically Abused: No    Sexually Abused: No    Allergies  Allergen Reactions   Crestor [Rosuvastatin] Other (See Comments)    Leg cramps   Glycopyrrolate     Other reaction(s): extreme dry mouth   Codeine Other (See Comments)    Hyper    Current Outpatient Medications  Medication Sig Dispense Refill   amiodarone (PACERONE) 200 MG tablet Take 2 tablets (400 mg) twice daily until 3/11. Then, take 1 tablet (200 mg) daily 90 tablet 1   apixaban (ELIQUIS) 5 MG TABS tablet Take 1 tablet (5 mg total) by mouth 2 (two) times daily. 60 tablet 11   atorvastatin (LIPITOR) 10 MG tablet Take 1 tablet (10 mg total) by mouth daily. 90 tablet 3   b complex vitamins tablet Take 1 tablet by mouth daily.       Bempedoic Acid-Ezetimibe (NEXLIZET) 180-10 MG TABS Take 1 tablet by mouth daily.     CALCIUM PO Take 650 mg by mouth daily.     Cholecalciferol (VITAMIN D3) 125 MCG (5000 UT) CAPS Take 5,000 Units by mouth daily.     denosumab (PROLIA) 60 MG/ML SOSY injection Inject 60 mg into the skin every 6 (six) months.     ezetimibe (ZETIA) 10 MG tablet Take 1 tablet (10 mg total) by mouth daily. 90 tablet 3   Garlic 1000 MG CAPS Take 1,000 mg by mouth daily.     Krill Oil Omega-3 300 MG CAPS Take 300 mg by mouth daily.     loperamide (IMODIUM) 2 MG capsule Take 1 capsule (2 mg total) by mouth 4 (four) times daily as needed for diarrhea or loose stools. 12 capsule 0   metoprolol succinate (TOPROL-XL) 25 MG 24 hr tablet Take 25 mg by mouth 2 (two) times daily.  Take additional tablet for palpitations     Multiple Vitamins-Minerals (MULTIVITAMINS THER. W/MINERALS) TABS Take 1 tablet by mouth daily.       pantoprazole (PROTONIX) 40 MG tablet Take 40 mg by mouth daily.     Zinc  50 MG TABS Take 50 mg by mouth daily.     No current facility-administered medications for this visit.     Family History  Problem Relation Age of Onset   Diabetes type II Mother  Breast cancer Mother    Leukemia Father    Diabetes type II Sister    Breast cancer Sister    Cancer Brother 3       throat 10/2020   Pancreatic cancer Maternal Aunt        x2   Diabetes Maternal Grandmother    Diabetes Maternal Grandfather    Colon cancer Neg Hx    Stomach cancer Neg Hx    Esophageal cancer Neg Hx        Physical Exam: Teeth in good repair Lungs; clear Card: RR with soft systolic murmru Ext: 2 + edema Neuro: alert and oriented no focal deficits     Diagnostic Studies & Laboratory data: I have personally reviewed the following studies and agree with the findings   TTE (11/2023) IMPRESSIONS     1. Left ventricular ejection fraction, by estimation, is 60 to 65%. The  left ventricle has normal function. The left ventricle has no regional  wall motion abnormalities. The left ventricular internal cavity size was  mildly dilated. Left ventricular  diastolic parameters are indeterminate. The average left ventricular  global longitudinal strain is -23.3 %. The global longitudinal strain is  normal.   2. Right ventricular systolic function is normal. The right ventricular  size is mildly enlarged. There is moderately elevated pulmonary artery  systolic pressure. The estimated right ventricular systolic pressure is  48.2 mmHg.   3. Left atrial size was severely dilated.   4. Right atrial size was mild to moderately dilated.   5. The mitral valve is degenerative. Severe mitral valve regurgitation.  There is prolapse of both leaflets of the mitral valve.   6. The tricuspid valve is degenerative. Tricuspid valve regurgitation is  moderate.   7. The aortic valve is normal in structure. Aortic valve regurgitation is  mild. Aortic valve sclerosis is present (LCC less mobile),  with no  evidence of aortic valve stenosis.   8. The inferior vena cava is normal in size with <50% respiratory  variability, suggesting right atrial pressure of 8 mmHg.   Comparison(s): No prior Echocardiogram.   FINDINGS   Left Ventricle: Left ventricular ejection fraction, by estimation, is 60  to 65%. The left ventricle has normal function. The left ventricle has no  regional wall motion abnormalities. The average left ventricular global  longitudinal strain is -23.3 %.  The global longitudinal strain is normal. The left ventricular internal  cavity size was mildly dilated. There is no left ventricular hypertrophy.  Left ventricular diastolic parameters are indeterminate. Normal left  ventricular filling pressure.   Right Ventricle: The right ventricular size is mildly enlarged. No  increase in right ventricular wall thickness. Right ventricular systolic  function is normal. There is moderately elevated pulmonary artery systolic  pressure. The tricuspid regurgitant  velocity is 3.17 m/s, and with an assumed right atrial pressure of 8 mmHg,  the estimated right ventricular systolic pressure is 48.2 mmHg.   Left Atrium: Left atrial size was severely dilated.   Right Atrium: Right atrial size was mild to moderately dilated.   Pericardium: There is no evidence of pericardial effusion.   Mitral Valve: The mitral valve is degenerative in appearance. There is  prolapse of both leaflets of the mitral valve. Mild mitral annular  calcification. Severe mitral valve regurgitation, with centrally-directed  jet.   Tricuspid Valve: The tricuspid valve is degenerative in appearance.  Tricuspid valve regurgitation is moderate . No evidence of tricuspid  stenosis.   Aortic Valve:  The aortic valve is normal in structure. Aortic valve  regurgitation is mild. Aortic valve sclerosis is present (LCC less  mobile), with no evidence of aortic valve stenosis.   Pulmonic Valve: The pulmonic valve  was not well visualized. Pulmonic valve  regurgitation is not visualized. No evidence of pulmonic stenosis.   Aorta: The aortic root is normal in size and structure.   Venous: The inferior vena cava is normal in size with less than 50%  respiratory variability, suggesting right atrial pressure of 8 mmHg.   IAS/Shunts: The atrial septum is grossly normal.     LEFT VENTRICLE  PLAX 2D  LVIDd:         5.45 cm   Diastology  LVIDs:         3.65 cm   LV e' medial:    8.38 cm/s  LV PW:         0.60 cm   LV E/e' medial:  9.5  LV IVS:        0.55 cm   LV e' lateral:   16.60 cm/s  LVOT diam:     2.00 cm   LV E/e' lateral: 4.8  LV SV:         54  LV SV Index:   29        2D Longitudinal Strain  LVOT Area:     3.14 cm  2D Strain GLS (A2C):   -21.1 %                           2D Strain GLS (A3C):   -21.4 %                           2D Strain GLS (A4C):   -27.5 %                           2D Strain GLS Avg:     -23.3 %                             3D Volume EF:                           3D EF:        69 %                           LV EDV:       143 ml                           LV ESV:       44 ml                           LV SV:        99 ml   RIGHT VENTRICLE  RV Basal diam:  4.00 cm  RV Mid diam:    4.20 cm  RV S prime:     13.40 cm/s  TAPSE (M-mode): 2.8 cm  RVSP:           48.2 mmHg   LEFT ATRIUM              Index  RIGHT ATRIUM           Index  LA diam:        4.60 cm  2.52 cm/m   RA Pressure: 8.00 mmHg  LA Vol (A2C):   110.0 ml 60.29 ml/m  RA Area:     23.50 cm  LA Vol (A4C):   81.6 ml  44.72 ml/m  RA Volume:   72.60 ml  39.79 ml/m  LA Biplane Vol: 96.0 ml  52.62 ml/m   AORTIC VALVE  LVOT Vmax:   78.35 cm/s  LVOT Vmean:  50.850 cm/s  LVOT VTI:    0.171 m    AORTA  Ao Root diam: 3.00 cm  Ao Asc diam:  3.50 cm   MITRAL VALVE                  TRICUSPID VALVE  MV Area (PHT): cm            TR Peak grad:   40.2 mmHg  MV Decel Time: 162 msec       TR Vmax:         317.00 cm/s  MR Peak grad:    117.5 mmHg   Estimated RAP:  8.00 mmHg  MR Mean grad:    77.0 mmHg    RVSP:           48.2 mmHg  MR Vmax:         542.00 cm/s  MR Vmean:        413.3 cm/s   SHUNTS  MR PISA:         1.57 cm     Systemic VTI:  0.17 m  MR PISA Eff ROA: 9 mm        Systemic Diam: 2.00 cm  MR PISA Radius:  0.50 cm  MV E velocity: 79.65 cm/s  MV A velocity: 42.15 cm/s  MV E/A ratio:  1.89   TEE (12/2023) IMPRESSIONS     1. Left ventricular ejection fraction, by estimation, is 60 to 65%. The  left ventricle has normal function.   2. Right ventricular systolic function is normal. The right ventricular  size is normal.   3. Left atrial size was severely dilated. No left atrial/left atrial  appendage thrombus was detected.   4. 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  Valve is degenerative and myxomatous, with bileaflet prolapse, posterior >  anterior. There is more prominent P1 scallop prolapse with anterior  leaflet override. There is prolapse of A2   scallop as well. The mitral valve is myxomatous. Severe mitral valve  regurgitation. No evidence of mitral stenosis. The mean mitral valve  gradient is 2.0 mmHg with average heart rate of 71 bpm.   5. Tricuspid valve is mildly thickened and likely has degenerative  change. No significant prolapse noted. Tricuspid valve regurgitation is  moderate.   6. The aortic valve is tricuspid. There is mild calcification of the  aortic valve. There is mild thickening of the aortic valve. Aortic valve  regurgitation is trivial. No aortic stenosis is present.   7. Possible tiny PFO with left to right shunt by color flow Doppler.  Agitated saline contrast bubble study was negative, with no evidence of  any left to right interatrial shunt.   8. There is Moderate (Grade III) plaque involving the aortic arch and  descending aorta.   9. 3D performed of mitral valve, tricuspid valve and demonstrates  myxomatous  mitral valve with prolapse.  10.  Rhythm strip during this exam demonstrates normal sinus rhythm.   FINDINGS   Left Ventricle: Left ventricular ejection fraction, by estimation, is 60  to 65%. The left ventricle has normal function. The left ventricular  internal cavity size was normal in size.   Right Ventricle: The right ventricular size is normal. No increase in  right ventricular wall thickness. Right ventricular systolic function is  normal.   Left Atrium: Left atrial size was severely dilated. No left atrial/left  atrial appendage thrombus was detected.   Right Atrium: Right atrial size was normal in size.   Pericardium: Trivial pericardial effusion is present.   Mitral Valve: Central, commissural severe MR with mixed mitral valve  pathology. Valve is degenerative and myxomatous, with bileaflet prolapse,  posterior > anterior. There is more prominent P1 scallop prolapse with  anterior leaflet override. There is  prolapse of A2 scallop as well. The mitral valve is myxomatous. Severe  mitral valve regurgitation. No evidence of mitral valve stenosis. MV peak  gradient, 4.0 mmHg. The mean mitral valve gradient is 2.0 mmHg with  average heart rate of 71 bpm.   Tricuspid Valve: Tricuspid valve is mildly thickened and likely has  degenerative change. No significant prolapse noted. The tricuspid valve is  grossly normal. Tricuspid valve regurgitation is moderate.   Aortic Valve: The aortic valve is tricuspid. There is mild calcification  of the aortic valve. There is mild thickening of the aortic valve. Aortic  valve regurgitation is trivial. No aortic stenosis is present.   Pulmonic Valve: The pulmonic valve was normal in structure. Pulmonic valve  regurgitation is not visualized.   Aorta: The aortic root and ascending aorta are structurally normal, with  no evidence of dilitation. There is moderate (Grade III) plaque involving  the aortic arch and descending aorta.   Venous:  A pattern of systolic flow reversal, suggestive of severe mitral  regurgitation is recorded from the left upper pulmonary vein and the left  lower pulmonary vein.   IAS/Shunts: Possible tiny PFO with left to right shunt by color flow  Doppler. Agitated saline contrast was given intravenously to evaluate for  intracardiac shunting. Agitated saline contrast bubble study was negative,  with no evidence of any interatrial  shunt.   EKG: Rhythm strip during this exam demonstrates normal sinus rhythm.   Additional Comments: 3D was performed not requiring image post processing  on an independent workstation and was abnormal. Spectral Doppler  performed.   LEFT VENTRICLE  PLAX 2D  LVOT diam:     2.00 cm  LV SV:         54  LV SV Index:   31  LVOT Area:     3.14 cm     AORTIC VALVE  LVOT Vmax:   76.17 cm/s  LVOT Vmean:  47.196 cm/s  LVOT VTI:    0.171 m    AORTA  Ao Root diam: 3.10 cm  Ao Asc diam:  3.30 cm   MITRAL VALVE                  TRICUSPID VALVE  MV Peak grad: 4.0 mmHg        TR Peak grad:   68.6 mmHg  MV Mean grad: 2.0 mmHg        TR Mean grad:   36.0 mmHg  MV Vmax:      1.00 m/s        TR Vmax:        414.00 cm/s  MV Vmean:     57.8 cm/s       TR Vmean:       278.0 cm/s  MR Peak grad:    110.7 mmHg  MR Mean grad:    75.0 mmHg    SHUNTS  MR Vmax:         526.00 cm/s  Systemic VTI:  0.17 m  MR Vmean:        407.0 cm/s   Systemic Diam: 2.00 cm  MR PISA:         3.53 cm  MR PISA Eff ROA: 26 mm  MR PISA Radius:  0.75 cm   CATH(12/2023) Conclusion  Right & Left Heart Catheterization 12/30/23: Hemodynamic data: Right heart catheterization consistent with mild pulmonary hypertension with normal LVEDP, prominent V waves on wedge pressure tracing suggests significant MR, both right upper pulmonary and left upper pulmonary veins were interrogated, approximate A wave 12 mmHg and V waves 22 mmHg. QP/QS 1.00, PAPi 3.0 suggest preserved RV function. CO 7.76, CI 4.41,  elevated due to significant MR probably. LVEDP 11 mmHg, no pressure differential between aorta and LV, no suggestion of aortic stenosis.   Angiographic data: RCA: Superdominant vessel.  It is tortuous in the midsegment.  Otherwise smooth and normal. LM: Large-caliber vessel smooth and normal. LAD: Ends before reaching the LV apex, gives origin to large D1 with secondary branches, has minimal disease in the midsegment. LCx: Small vessel, small OM1.      Impression and recommendations: Findings suggestive of severe MR.  Will await on formal reports on TEE and make final recommendations.   Recent Radiology Findings:       Recent Lab Findings: Lab Results  Component Value Date   WBC 6.3 12/31/2023   HGB 9.5 (L) 12/31/2023   HCT 29.0 (L) 12/31/2023   PLT 133 (L) 12/31/2023   GLUCOSE 102 (H) 12/31/2023   CHOL 104 04/29/2021   TRIG 157 (H) 04/29/2021   HDL 38 (L) 04/29/2021   LDLCALC 39 04/29/2021   ALT 16 12/30/2023   AST 26 12/30/2023   NA 136 12/31/2023   K 3.5 12/31/2023   CL 105 12/31/2023   CREATININE 0.81 12/31/2023   BUN 10 12/31/2023   CO2 23 12/31/2023   TSH 3.482 12/30/2023      Assessment / Plan:     83 yo female with NYHA class II symptoms of severe MR, moderate TR with normal LV function and no CAD and no PHTN. Pt developed afib after cath. She has a class I indication for MV repair and will also have TV repair and pulm vein isolation maze with occlusion of LAA. She understands all the risks and goals and recovery from this and wishes to proceed. Will need to be off her eliquis for 5 days prior to surgery planned on 4/9. She will have as a back up to repair tissue valve replacement which may be needed in 5%.  Will start her on Lasix 20mg  day with potasium secondary to her increase in lower ext edema prior to surgery.   I have spent 60 min in review of the records, viewing studies and in face to face with patient and in coordination of future care    Eugenio Hoes 01/11/2024 7:00 PM

## 2024-01-12 ENCOUNTER — Institutional Professional Consult (permissible substitution): Payer: Medicare HMO | Admitting: Thoracic Surgery (Cardiothoracic Vascular Surgery)

## 2024-01-12 ENCOUNTER — Encounter: Payer: Self-pay | Admitting: *Deleted

## 2024-01-12 ENCOUNTER — Other Ambulatory Visit: Payer: Self-pay | Admitting: *Deleted

## 2024-01-12 ENCOUNTER — Other Ambulatory Visit: Payer: Self-pay | Admitting: Thoracic Surgery (Cardiothoracic Vascular Surgery)

## 2024-01-12 VITALS — BP 141/70 | HR 57 | Resp 18 | Ht 64.0 in | Wt 160.0 lb

## 2024-01-12 DIAGNOSIS — I340 Nonrheumatic mitral (valve) insufficiency: Secondary | ICD-10-CM

## 2024-01-12 DIAGNOSIS — Q228 Other congenital malformations of tricuspid valve: Secondary | ICD-10-CM

## 2024-01-12 MED ORDER — POTASSIUM CHLORIDE CRYS ER 20 MEQ PO TBCR: 10.0000 meq | EXTENDED_RELEASE_TABLET | Freq: Every day | ORAL | 1 refills | Status: DC

## 2024-01-12 MED ORDER — FUROSEMIDE 20 MG PO TABS: 20.0000 mg | ORAL_TABLET | Freq: Every day | ORAL | 11 refills | Status: DC

## 2024-01-12 NOTE — Patient Instructions (Signed)
 Mitral valve and Tricuspid valve repair with pulmonary vein isolation maze Start lasix and potasium CT of chest  Carotid duplex

## 2024-01-13 ENCOUNTER — Encounter: Payer: Self-pay | Admitting: Physician Assistant

## 2024-01-13 ENCOUNTER — Ambulatory Visit: Attending: Physician Assistant | Admitting: Physician Assistant

## 2024-01-13 VITALS — BP 100/52 | HR 60 | Ht 64.0 in | Wt 158.0 lb

## 2024-01-13 DIAGNOSIS — I10 Essential (primary) hypertension: Secondary | ICD-10-CM

## 2024-01-13 DIAGNOSIS — E785 Hyperlipidemia, unspecified: Secondary | ICD-10-CM

## 2024-01-13 DIAGNOSIS — I480 Paroxysmal atrial fibrillation: Secondary | ICD-10-CM

## 2024-01-13 DIAGNOSIS — I340 Nonrheumatic mitral (valve) insufficiency: Secondary | ICD-10-CM

## 2024-01-13 MED ORDER — AMIODARONE HCL 200 MG PO TABS: 200.0000 mg | ORAL_TABLET | Freq: Every day | ORAL | Status: DC

## 2024-01-13 NOTE — Patient Instructions (Signed)
 Medication Instructions:  NO CHANGES *If you need a refill on your cardiac medications before your next appointment, please call your pharmacy*   Lab Work: NO LABS If you have labs (blood work) drawn today and your tests are completely normal, you will receive your results only by: MyChart Message (if you have MyChart) OR A paper copy in the mail If you have any lab test that is abnormal or we need to change your treatment, we will call you to review the results.   Testing/Procedures: NO TESTING   Follow-Up: At [facility], you and your health needs are our priority.  As part of our continuing mission to provide you with exceptional heart care, we have created designated Provider Care Teams.  These Care Teams include your primary Cardiologist (physician) and Advanced Practice Providers (APPs -  Physician Assistants and Nurse Practitioners) who all work together to provide you with the care you need, when you need it.  Your next appointment:   KEEP FOLLOW UP MARCH 2025  Provider:   Yates Decamp, MD   Other Instructions

## 2024-01-13 NOTE — Progress Notes (Signed)
 Cardiology Office Note:  .   Date:  01/13/2024  ID:  [name], DOB 1941-09-17, MRN [id number] PCP: Merri Brunette, MD  [facility] Providers Cardiologist:  Yates Decamp, MD     History of Present Illness: .   [name] is a 83 y.o. female with PMH of hypertension, hyperlipidemia, CKD stage III, diverticulosis, endometrial cancer in 2009 treated with surgery and radiation therapy, mitral valve prolapse and MR, chronic dyspnea on exertion, iron deficiency anemia with prior GI evaluation revealing gastritis in 2019 on iron therapy.  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  Echocardiogram obtained on 11/14/2023 showed EF of 60 to 65%, RVSP 48.2 mmHg, severe LAE, mild to moderate RAE, severe MR with prolapse of both leaflets of mitral valve, moderate TR, mild AI.  Patient was last seen by Dr. Jacinto Halim on 12/14/2023.  Dr. Jacinto Halim recommended cardiac catheterization and TEE prior to consideration of valve surgery by CT surgery.  Cardiac catheterization performed on 12/30/2023 showed essentially normal coronary arteries, cardiac index 4.41, elevated likely due to significant MR, severe mitral regurgitation.  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  Postop course, she went into atrial fibrillation with RVR with heart rate up to 150s.  She was asymptomatic other than mild complaint of palpitation.  She was seen by Dr. Jacinto Halim who felt atrial fibrillation onset related to mitral regurgitation.  She was started on IV amiodarone and Eliquis.  She converted back to sinus rhythm on IV amiodarone.  Amiodarone was switched to oral 400 mg twice a day for 10 days then 200 mg daily  thereafter.  She was continued on metoprolol succinate 25 mg daily.  With instruction of taking additional dose as needed for palpitation.  She was discharged on Eliquis 5 mg twice a day.  She was seen by Dr. Leafy Ro of CT surgery yesterday risk and benefit of the procedure has been discussed with the patient, she is willing to proceed with mitral valve repair, tricuspid valve repair and pulmonic vein isolation with maze and occlusion of LAA.  Dr. Leafy Ro recommended she come off of Eliquis for 5 days prior to the surgery which is scheduled for/07/2024.  She was prescribed Lasix 20 mg daily and the potassium secondary to her increased lower extremity edema prior to surgery.   Patient presents today for follow-up.  She has decreased her amiodarone to 200 mg daily after taking 400 mg twice a day for 10 days.  She denies any significant shortness of breath, her lung is clear on exam.  She does have lower extremity edema, however this has improved after she was started on Lasix 20 mg daily and potassium supplement.  She says she used to be on triamterene, however the medication was stopped during recent hospitalization.  Her blood pressure is borderline, I recommended continue on the current therapy.  She does not have any dizziness.  She has no chest pain, orthopnea or PND.  She is currently on Nexlizet, however her insurance has declined to pay for the Nexlizet, after she finishes the current bottle, she will transition to Lipitor and Zetia.  ROS:   She denies chest pain, palpitations, dyspnea, pnd, orthopnea, n, v, dizziness, syncope,  edema, weight gain, or early satiety. All other systems reviewed and are otherwise negative except as noted above.    Studies Reviewed: .        Cardiac Studies & Procedures   ______________________________________________________________________________________________ CARDIAC CATHETERIZATION  CARDIAC CATHETERIZATION 12/30/2023  Narrative Images from the original  result were not included. Right & Left Heart Catheterization 12/30/23: Hemodynamic data: Right heart catheterization consistent with mild pulmonary hypertension with normal LVEDP, prominent V waves on wedge pressure tracing suggests significant MR, both right upper pulmonary and left upper pulmonary veins were interrogated, approximate A wave 12 mmHg and V waves 22 mmHg. QP/QS 1.00, PAPi 3.0 suggest preserved RV function. CO 7.76, CI 4.41, elevated due to significant MR probably. LVEDP 11 mmHg, no pressure differential between aorta and LV, no suggestion of aortic stenosis.  Angiographic data: RCA: Superdominant vessel.  It is tortuous in the midsegment.  Otherwise smooth and normal. LM: Large-caliber vessel smooth and normal. LAD: Ends before reaching the LV apex, gives origin to large D1 with secondary branches, has minimal disease in the midsegment. LCx: Small vessel, small OM1.    Impression and recommendations: Findings suggestive of severe MR.  Will await on formal reports on TEE and make final recommendations.  Findings Coronary Findings Diagnostic  Dominance: Right  Left Anterior Descending Mid LAD lesion is 20% stenosed.  Intervention  No interventions have been documented.   STRESS TESTS  PCV MYOCARDIAL PERFUSION WITH LEXISCAN 11/12/2019  Narrative Lexiscan Tetrofosmin Stress Test  11/12/2019: Nondiagnostic ECG stress. Mild soft tissue attenuation noted in the inferior wall. No ischemia or scar. Stress LV EF: 70%. No previous exam available for comparison. Low risk study.   ECHOCARDIOGRAM  ECHOCARDIOGRAM COMPLETE 11/14/2023  Narrative ECHOCARDIOGRAM REPORT    Patient Name:   [name]  Date of Exam: 11/14/2023 Medical Rec #:  [id number]     Height:       67.0 in Accession #:    [id number]    Weight:       157.0 lb Date of Birth:  06-Aug-1941     BSA:          1.825 m Patient Age:    82 years      BP:           103/53 mmHg Patient Gender: F              HR:           47 bpm. Exam Location:  [address]  Procedure: 2D Echo, 3D Echo, Cardiac Doppler, Color Doppler and Strain Analysis  Indications:    I34.1 Mitral Valve Prolapse with Severe Mitral Insufficiency  History:        Patient has prior history of Echocardiogram examinations, most recent 05/10/2023. Mitral Valve Prolapse, Mitral Valve Disease and Tricuspid Valve Prolapse, Signs/Symptoms:Edema and Murmur; Risk Factors:Hypertension. Palpitations, Tachycardia, History of Endometrial Carcinoma status post Radiation (2009), Chronic Kidney Disease.  Sonographer:    Farrel Conners RDCS Referring Phys: Yates Decamp  IMPRESSIONS   1. Left ventricular ejection fraction, by estimation, is 60 to 65%. The left ventricle has normal function. The left ventricle has no regional wall motion abnormalities. The left ventricular internal cavity size was mildly dilated. Left ventricular diastolic parameters are indeterminate. The average left ventricular global longitudinal strain is -23.3 %. The global longitudinal strain is normal. 2. Right ventricular systolic function is normal. The right ventricular size is mildly enlarged. There is moderately elevated pulmonary artery systolic pressure. The estimated right ventricular systolic  pressure is 48.2 mmHg. 3. Left atrial size was severely dilated. 4. Right atrial size was mild to moderately dilated. 5. The mitral valve is degenerative. Severe mitral valve regurgitation. There is prolapse of both leaflets of the mitral valve. 6. The tricuspid valve is degenerative. Tricuspid valve regurgitation is moderate. 7. The aortic valve is normal in structure. Aortic valve regurgitation is mild. Aortic valve sclerosis is present (LCC less mobile), with no evidence of aortic valve stenosis. 8. The inferior vena cava is normal in size with <50% respiratory variability, suggesting right atrial pressure of 8 mmHg.  Comparison(s): No prior  Echocardiogram.  FINDINGS Left Ventricle: Left ventricular ejection fraction, by estimation, is 60 to 65%. The left ventricle has normal function. The left ventricle has no regional wall motion abnormalities. The average left ventricular global longitudinal strain is -23.3 %. The global longitudinal strain is normal. The left ventricular internal cavity size was mildly dilated. There is no left ventricular hypertrophy. Left ventricular diastolic parameters are indeterminate. Normal left ventricular filling pressure.  Right Ventricle: The right ventricular size is mildly enlarged. No increase in right ventricular wall thickness. Right ventricular systolic function is normal. There is moderately elevated pulmonary artery systolic pressure. The tricuspid regurgitant velocity is 3.17 m/s, and with an assumed right atrial pressure of 8 mmHg, the estimated right ventricular systolic pressure is 48.2 mmHg.  Left Atrium: Left atrial size was severely dilated.  Right Atrium: Right atrial size was mild to moderately dilated.  Pericardium: There is no evidence of pericardial effusion.  Mitral Valve: The mitral valve is degenerative in appearance. There is prolapse of both leaflets of the mitral valve. Mild mitral annular calcification. Severe mitral valve regurgitation, with centrally-directed jet.  Tricuspid Valve: The tricuspid valve is degenerative in appearance. Tricuspid valve regurgitation is moderate . No evidence of tricuspid stenosis.  Aortic Valve: The aortic valve is normal in structure. Aortic valve regurgitation is mild. Aortic valve sclerosis is present (LCC less mobile), with no evidence of aortic valve stenosis.  Pulmonic Valve: The pulmonic valve was not well visualized. Pulmonic valve regurgitation is not visualized. No evidence of pulmonic stenosis.  Aorta: The aortic root is normal in size and structure.  Venous: The inferior vena cava is normal in size with less than 50%  respiratory variability, suggesting right atrial pressure of 8 mmHg.  IAS/Shunts: The atrial septum is grossly normal.   LEFT VENTRICLE PLAX 2D LVIDd:         5.45 cm   Diastology LVIDs:         3.65 cm   LV e' medial:    8.38 cm/s LV PW:         0.60 cm   LV E/e' medial:  9.5 LV IVS:        0.55 cm   LV e' lateral:   16.60 cm/s LVOT diam:     2.00 cm   LV E/e' lateral: 4.8 LV SV:         54 LV SV Index:   29        2D Longitudinal Strain LVOT Area:     3.14 cm  2D Strain GLS (A2C):   -21.1 % 2D Strain GLS (A3C):   -21.4 % 2D Strain GLS (A4C):   -27.5 % 2D Strain GLS Avg:     -23.3 %  3D Volume EF: 3D EF:        69 % LV EDV:       143 ml LV ESV:  44 ml LV SV:        99 ml  RIGHT VENTRICLE RV Basal diam:  4.00 cm RV Mid diam:    4.20 cm RV S prime:     13.40 cm/s TAPSE (M-mode): 2.8 cm RVSP:           48.2 mmHg  LEFT ATRIUM              Index        RIGHT ATRIUM           Index LA diam:        4.60 cm  2.52 cm/m   RA Pressure: 8.00 mmHg LA Vol (A2C):   110.0 ml 60.29 ml/m  RA Area:     23.50 cm LA Vol (A4C):   81.6 ml  44.72 ml/m  RA Volume:   72.60 ml  39.79 ml/m LA Biplane Vol: 96.0 ml  52.62 ml/m AORTIC VALVE LVOT Vmax:   78.35 cm/s LVOT Vmean:  50.850 cm/s LVOT VTI:    0.171 m  AORTA Ao Root diam: 3.00 cm Ao Asc diam:  3.50 cm  MITRAL VALVE                  TRICUSPID VALVE MV Area (PHT): cm            TR Peak grad:   40.2 mmHg MV Decel Time: 162 msec       TR Vmax:        317.00 cm/s MR Peak grad:    117.5 mmHg   Estimated RAP:  8.00 mmHg MR Mean grad:    77.0 mmHg    RVSP:           48.2 mmHg MR Vmax:         542.00 cm/s MR Vmean:        413.3 cm/s   SHUNTS MR PISA:         1.57 cm     Systemic VTI:  0.17 m MR PISA Eff ROA: 9 mm        Systemic Diam: 2.00 cm MR PISA Radius:  0.50 cm MV E velocity: 79.65 cm/s MV A velocity: 42.15 cm/s MV E/A ratio:  1.89  Sunit Tolia Electronically signed by Tessa Lerner Signature Date/Time:  11/14/2023/12:01:25 PM    Final   TEE  ECHO TEE 12/30/2023  Narrative TRANSESOPHOGEAL ECHO REPORT    Patient Name:   [name] Date of Exam: 12/30/2023 Medical Rec #:  [id number]    Height:       64.0 in Accession #:    [id number]   Weight:       156.0 lb Date of Birth:  July 11, 1941    BSA:          1.760 m Patient Age:    82 years     BP:           131/71 mmHg Patient Gender: F            HR:           83 bpm. Exam Location:  Inpatient  Procedure: Transesophageal Echo, 3D Echo, Cardiac Doppler, Color Doppler and Saline Contrast Bubble Study (Both Spectral and Color Flow Doppler were utilized during procedure).  Indications:     Mitral Regurgitation  History:         Patient has prior history of Echocardiogram examinations, most recent 11/14/2023. Risk Factors:Hypertension.  Sonographer:     Lucendia Herrlich RCS Referring Phys:  [id number] Parke Poisson Diagnosing Phys: Weston Brass MD  PROCEDURE: After discussion of the risks and benefits of a TEE, an informed consent was obtained from the patient. TEE procedure time was 40 minutes. The transesophogeal probe was passed without difficulty through the esophogus of the patient. Imaged were obtained with the patient in a left lateral decubitus position. Local oropharyngeal anesthetic was provided with Cetacaine. Sedation performed by different physician. The patient was monitored while under deep sedation. Anesthestetic sedation was provided intravenously by Anesthesiology: 260mg  of Propofol. Image quality was excellent. The patient's vital signs; including heart rate, blood pressure, and oxygen saturation; remained stable throughout the procedure. Supplementary images were obtained from transthoracic windows as indicated to answer the clinical question. The patient developed no complications during the procedure.  IMPRESSIONS   1. Left ventricular ejection fraction, by estimation, is 60 to 65%. The left ventricle has normal  function. 2. Right ventricular systolic function is normal. The right ventricular size is normal. 3. Left atrial size was severely dilated. No left atrial/left atrial appendage thrombus was detected. 4. 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 Valve is degenerative and myxomatous, with bileaflet prolapse, posterior > anterior. There is more prominent P1 scallop prolapse with anterior leaflet override. There is prolapse of A2 scallop as well. The mitral valve is myxomatous. Severe mitral valve regurgitation. No evidence of mitral stenosis. The mean mitral valve gradient is 2.0 mmHg with average heart rate of 71 bpm. 5. Tricuspid valve is mildly thickened and likely has degenerative change. No significant prolapse noted. Tricuspid valve regurgitation is moderate. 6. The aortic valve is tricuspid. There is mild calcification of the aortic valve. There is mild thickening of the aortic valve. Aortic valve regurgitation is trivial. No aortic stenosis is present. 7. Possible tiny PFO with left to right shunt by color flow Doppler. Agitated saline contrast bubble study was negative, with no evidence of any left to right interatrial shunt. 8. There is Moderate (Grade III) plaque involving the aortic arch and descending aorta. 9. 3D performed of mitral valve, tricuspid valve and demonstrates myxomatous mitral valve with prolapse. 10. Rhythm strip during this exam demonstrates normal sinus rhythm.  FINDINGS Left Ventricle: Left ventricular ejection fraction, by estimation, is 60 to 65%. The left ventricle has normal function. The left ventricular internal cavity size was normal in size.  Right Ventricle: The right ventricular size is normal. No increase in right ventricular wall thickness. Right ventricular systolic function is normal.  Left Atrium: Left atrial size was severely dilated. No left atrial/left atrial appendage thrombus was detected.  Right Atrium: Right atrial size was  normal in size.  Pericardium: Trivial pericardial effusion is present.  Mitral Valve: Central, commissural severe MR with mixed mitral valve pathology. Valve is degenerative and myxomatous, with bileaflet prolapse, posterior > anterior. There is more prominent P1 scallop prolapse with anterior leaflet override. There is prolapse of A2 scallop as well. The mitral valve is myxomatous. Severe mitral valve regurgitation. No evidence of mitral valve stenosis. MV peak gradient, 4.0 mmHg. The mean mitral valve gradient is 2.0 mmHg with average heart rate of 71 bpm.  Tricuspid Valve: Tricuspid valve is mildly thickened and likely has degenerative change. No significant prolapse noted. The tricuspid valve is grossly normal. Tricuspid valve regurgitation is moderate.  Aortic Valve: The aortic valve is tricuspid. There is mild calcification of the aortic valve. There is mild thickening of the aortic valve. Aortic valve regurgitation is trivial. No aortic stenosis is present.  Pulmonic Valve: The pulmonic valve was normal in structure. Pulmonic valve regurgitation is not visualized.  Aorta: The aortic root and ascending aorta are structurally normal, with no evidence of dilitation. There is moderate (Grade III) plaque involving the aortic arch and descending aorta.  Venous: A pattern of systolic flow reversal, suggestive of severe mitral regurgitation is recorded from the left upper pulmonary vein and the left lower pulmonary vein.  IAS/Shunts: Possible tiny PFO with left to right shunt by color flow Doppler. Agitated saline contrast was given intravenously to evaluate for intracardiac shunting. Agitated saline contrast bubble study was negative, with no evidence of any interatrial shunt.  EKG: Rhythm strip during this exam demonstrates normal sinus rhythm.  Additional Comments: 3D was performed not requiring image post processing on an independent workstation and was abnormal. Spectral Doppler  performed.  LEFT VENTRICLE PLAX 2D LVOT diam:     2.00 cm LV SV:         54 LV SV Index:   31 LVOT Area:     3.14 cm   AORTIC VALVE LVOT Vmax:   76.17 cm/s LVOT Vmean:  47.196 cm/s LVOT VTI:    0.171 m  AORTA Ao Root diam: 3.10 cm Ao Asc diam:  3.30 cm  MITRAL VALVE                  TRICUSPID VALVE MV Peak grad: 4.0 mmHg        TR Peak grad:   68.6 mmHg MV Mean grad: 2.0 mmHg        TR Mean grad:   36.0 mmHg MV Vmax:      1.00 m/s        TR Vmax:        414.00 cm/s MV Vmean:     57.8 cm/s       TR Vmean:       278.0 cm/s MR Peak grad:    110.7 mmHg MR Mean grad:    75.0 mmHg    SHUNTS MR Vmax:         526.00 cm/s  Systemic VTI:  0.17 m MR Vmean:        407.0 cm/s   Systemic Diam: 2.00 cm MR PISA:         3.53 cm MR PISA Eff ROA: 26 mm MR PISA Radius:  0.75 cm  Weston Brass MD Electronically signed by Weston Brass MD Signature Date/Time: 12/31/2023/4:48:23 PM    Final (Updated)  MONITORS  LONG TERM MONITOR (3-14 DAYS) 11/26/2021       ______________________________________________________________________________________________      Risk Assessment/Calculations:    CHA2DS2-VASc Score = 4   This indicates a 4.8% annual risk of stroke. The patient's score is based upon: CHF History: 0 HTN History: 1 Diabetes History: 0 Stroke History: 0 Vascular Disease History: 0 Age Score: 2 Gender Score: 1            Physical Exam:   VS:  BP (!) 100/52 (BP Location: Left Arm, Patient Position: Sitting, Cuff Size: Normal)   Pulse 60   Ht 5\' 4"  (1.626 m)   Wt 158 lb (71.7 kg)   SpO2 91%   BMI 27.12 kg/m    Wt Readings from Last 3 Encounters:  01/13/24 158 lb (71.7 kg)  01/12/24 160 lb (72.6 kg)  12/30/23 152 lb 8 oz (69.2 kg)    GEN: Well nourished, well developed in no acute distress NECK: No JVD; No carotid bruits CARDIAC: RRR, no  murmurs, rubs, gallops RESPIRATORY:  Clear to auscultation without rales, wheezing or rhonchi  ABDOMEN: Soft,  non-tender, non-distended EXTREMITIES:  No edema; No deformity   ASSESSMENT AND PLAN: .     Severe Mitral Regurgitation Severe mitral regurgitation with prolapse of both mitral valve leaflets, confirmed by echocardiogram and TEE. The mitral valve is myxomatous and degenerative, causing significant regurgitation, contributing to dyspnea and edema. She has moderate TR. She is scheduled for mitral valve repair surgery.  - Continue metoprolol succinate 25 mg daily. - Continue Lasix 20 mg daily with potassium supplement. - Hold Eliquis for 5 days prior to surgery. - Proceed with scheduled mitral valve repair surgery on 02/08/2024.  Paroxysmal Atrial Fibrillation Post-operative atrial fibrillation with rapid ventricular response occurred after cardiac catheterization. Converted to sinus rhythm with IV amiodarone, now on oral amiodarone. Risk of recurrence managed with ongoing antiarrhythmic therapy. Continuing amiodarone and metoprolol through surgery to prevent recurrence. - Continue amiodarone 200 mg daily. - Continue metoprolol succinate 25 mg daily.  Hypertension: Blood pressure well-controlled  Hyperlipidemia Hyperlipidemia managed with Nexlizet, transitioning to Lipitor and zetia due to insurance coverage issues. - Continue Nexlizet until current supply is finished. - Transition to Lipitor and Zetia after Nexlizet is finished.        Dispo: Follow-up with Dr. Jacinto Halim as scheduled  Signed, Azalee Course, [place]

## 2024-01-25 ENCOUNTER — Other Ambulatory Visit (HOSPITAL_COMMUNITY): Payer: Self-pay

## 2024-01-25 ENCOUNTER — Encounter: Payer: Self-pay | Admitting: Cardiology

## 2024-01-25 ENCOUNTER — Ambulatory Visit: Payer: Medicare HMO | Attending: Cardiology | Admitting: Cardiology

## 2024-01-25 VITALS — BP 118/62 | HR 61 | Resp 16 | Ht 64.0 in | Wt 151.8 lb

## 2024-01-25 DIAGNOSIS — I341 Nonrheumatic mitral (valve) prolapse: Secondary | ICD-10-CM | POA: Diagnosis not present

## 2024-01-25 DIAGNOSIS — I340 Nonrheumatic mitral (valve) insufficiency: Secondary | ICD-10-CM

## 2024-01-25 DIAGNOSIS — I480 Paroxysmal atrial fibrillation: Secondary | ICD-10-CM | POA: Diagnosis not present

## 2024-01-25 NOTE — Patient Instructions (Signed)
 Medication Instructions:  Your physician recommends that you continue on your current medications as directed. Please refer to the Current Medication list given to you today.  *If you need a refill on your cardiac medications before your next appointment, please call your pharmacy*   Lab Work: none If you have labs (blood work) drawn today and your tests are completely normal, you will receive your results only by: MyChart Message (if you have MyChart) OR A paper copy in the mail If you have any lab test that is abnormal or we need to change your treatment, we will call you to review the results.   Testing/Procedures: none   Follow-Up: At [facility], you and your health needs are our priority.  As part of our continuing mission to provide you with exceptional heart care, we have created designated Provider Care Teams.  These Care Teams include your primary Cardiologist (physician) and Advanced Practice Providers (APPs -  Physician Assistants and Nurse Practitioners) who all work together to provide you with the care you need, when you need it.  We recommend signing up for the patient portal called "MyChart".  Sign up information is provided on this After Visit Summary.  MyChart is used to connect with patients for Virtual Visits (Telemedicine).  Patients are able to view lab/test results, encounter notes, upcoming appointments, etc.  Non-urgent messages can be sent to your provider as well.   To learn more about what you can do with MyChart, go to [email or web address].    Your next appointment:   May 15 at 11:00  Provider:   Yates Decamp, MD     Other Instructions

## 2024-01-25 NOTE — Progress Notes (Signed)
 Cardiology Office Note:  .   Date:  01/25/2024  ID:  [name], DOB [birth year], MRN [id number] PCP: Merri Brunette, MD   [facility] Providers Cardiologist:  Yates Decamp, MD   History of Present Illness: .   [name] is a 83 y.o. Due to symptoms of dyspnea and heart failure, she underwent right and left heart catheterization on 12/30/2023 revealing normal coronary arteries and severe MR by right heart catheterization data.  TEE performed on 11/14/2023 again confirmed severe MR due to MVP.  Postprocedure she developed A-fib with RVR hence needed hospitalization and discharged home on amiodarone.  She converted spontaneously to sinus rhythm.  Discussed the use of AI scribe software for clinical note transcription with the patient, who gave verbal consent to proceed.  History of Present Illness The patient, with a history of mitral valve prolapse and tricuspid valve regurgitation, is scheduled for open heart surgery on April 9th. She reports feeling 'tired' and experiencing dizziness. She denies severe shortness of breath or nocturnal dyspnea. She was recently put back on Lasix for leg swelling, which she believes has improved her breathing. She also has a history of atrial fibrillation, which was controlled with amiodarone after a recent procedure. She reports only one brief episode of suspected atrial fibrillation since then, which resolved with an extra dose of amiodarone.  Labs   Lab Results  Component Value Date   CHOL 104 04/29/2021   HDL 38 (L) 04/29/2021   LDLCALC 39 04/29/2021   TRIG 157 (H) 04/29/2021   Lab Results  Component Value Date   NA 136 12/31/2023   K 3.5 12/31/2023   CO2 23 12/31/2023   GLUCOSE 102 (H) 12/31/2023   BUN 10 12/31/2023   CREATININE 0.81 12/31/2023   CALCIUM 8.0 (L) 12/31/2023   GFR 47.21 (L) 01/15/2020   EGFR 61 12/16/2023   GFRNONAA >60 12/31/2023      Latest Ref Rng & Units 12/31/2023    4:30 AM 12/30/2023    3:43 PM 12/30/2023   12:02  PM  BMP  Glucose 70 - 99 mg/dL 119  147    BUN 8 - 23 mg/dL 10  12    Creatinine 8.29 - 1.00 mg/dL 5.62  1.30    Sodium 865 - 145 mmol/L 136  140  141    142   Potassium 3.5 - 5.1 mmol/L 3.5  3.5  3.8    3.8   Chloride 98 - 111 mmol/L 105  108    CO2 22 - 32 mmol/L 23  22    Calcium 8.9 - 10.3 mg/dL 8.0  8.6        Latest Ref Rng & Units 12/31/2023    4:30 AM 12/30/2023    3:43 PM 12/30/2023   12:02 PM  CBC  WBC 4.0 - 10.5 K/uL 6.3  6.7    Hemoglobin 12.0 - 15.0 g/dL 9.5  78.4  9.5    9.5   Hematocrit 36.0 - 46.0 % 29.0  35.1  28.0    28.0   Platelets 150 - 400 K/uL 133  162     No results found for: "HGBA1C"  Lab Results  Component Value Date   TSH 3.482 12/30/2023    Review of Systems  Cardiovascular:  Positive for irregular heartbeat (one brief episode). Negative for chest pain, dyspnea on exertion and leg swelling.   Physical Exam:   VS:  BP 118/62 (BP Location: Left Arm, Patient Position: Sitting, Cuff Size: Normal)  Pulse 61   Resp 16   Ht 5\' 4"  (1.626 m)   Wt 151 lb 12.8 oz (68.9 kg)   SpO2 96%   BMI 26.06 kg/m    Wt Readings from Last 3 Encounters:  01/25/24 151 lb 12.8 oz (68.9 kg)  01/13/24 158 lb (71.7 kg)  01/12/24 160 lb (72.6 kg)    Physical Exam Neck:     Vascular: Carotid bruit (bilateral) present. No JVD.  Cardiovascular:     Rate and Rhythm: Normal rate and regular rhythm.     Pulses: Intact distal pulses.     Heart sounds: S1 normal and S2 normal. Murmur heard.     Blowing holosystolic murmur is present with a grade of 3/6 radiating to the apex.     No gallop.  Pulmonary:     Effort: Pulmonary effort is normal.     Breath sounds: Normal breath sounds.  Abdominal:     General: Bowel sounds are normal.     Palpations: Abdomen is soft.  Musculoskeletal:     Right lower leg: Edema (1+ ankle pitting) present.     Left lower leg: Edema (1+ ankle pitting) present.    Studies Reviewed: .    Right & Left Heart Catheterization  12/30/23: Hemodynamic data: Right heart catheterization consistent with mild pulmonary hypertension with normal LVEDP, prominent V waves on wedge pressure tracing suggests significant MR, both right upper pulmonary and left upper pulmonary veins were interrogated, approximate A wave 12 mmHg and V waves 22 mmHg. QP/QS 1.00, PAPi 3.0 suggest preserved RV function. CO 7.76, CI 4.41, elevated due to significant MR probably. LVEDP 11 mmHg, no pressure differential between aorta and LV, no suggestion of aortic stenosis.   Angiographic data: Minimal disease in mid LAD.      ECHOCARDIOGRAM COMPLETE 11/14/2023 1. Left ventricular ejection fraction, by estimation, is 60 to 65%. The left ventricle has normal function. The left ventricle has no regional wall motion abnormalities. The left ventricular internal cavity size was mildly dilated. Left ventricular diastolic parameters are indeterminate. The average left ventricular global longitudinal strain is -23.3 %. The global longitudinal strain is normal. 2. Right ventricular systolic function is normal. The right ventricular size is mildly enlarged. There is moderately elevated pulmonary artery systolic pressure. The estimated right ventricular systolic pressure is 48.2 mmHg. 3. Left atrial size was severely dilated. 4. Right atrial size was mild to moderately dilated. 5. The mitral valve is degenerative. Severe mitral valve regurgitation. There is prolapse of both leaflets of the mitral valve. 6. The tricuspid valve is degenerative. Tricuspid valve regurgitation is moderate. 7. The aortic valve is normal in structure. Aortic valve regurgitation is mild. Aortic valve sclerosis is present (LCC less mobile), with no evidence of aortic valve stenosis. 8. The inferior vena cava is normal in size with <50% respiratory variability, suggesting right atrial pressure of 8 mmHg.  EKG:    EKG 01/13/2024: Normal sinus rhythm at the rate of 60 bpm, normal axis,  nonspecific T abnormality.  Compared to 12/30/2023, atrial fibrillation with rapid ventricular response no longer present.  Medications and allergies    Allergies  Allergen Reactions   Crestor [Rosuvastatin] Other (See Comments)    Leg cramps   Glycopyrrolate     Other reaction(s): extreme dry mouth   Codeine Other (See Comments)    Hyper     Current Outpatient Medications:    amiodarone (PACERONE) 200 MG tablet, Take 1 tablet (200 mg total) by mouth daily., Disp: ,  Rfl:    apixaban (ELIQUIS) 5 MG TABS tablet, Take 1 tablet (5 mg total) by mouth 2 (two) times daily., Disp: 60 tablet, Rfl: 11   b complex vitamins tablet, Take 1 tablet by mouth daily.  , Disp: , Rfl:    Bempedoic Acid-Ezetimibe (NEXLIZET) 180-10 MG TABS, Take 1 tablet by mouth daily., Disp: , Rfl:    CALCIUM PO, Take 650 mg by mouth daily., Disp: , Rfl:    Cholecalciferol (VITAMIN D3) 125 MCG (5000 UT) CAPS, Take 5,000 Units by mouth daily., Disp: , Rfl:    denosumab (PROLIA) 60 MG/ML SOSY injection, Inject 60 mg into the skin every 6 (six) months., Disp: , Rfl:    furosemide (LASIX) 20 MG tablet, Take 1 tablet (20 mg total) by mouth daily., Disp: 30 tablet, Rfl: 11   Garlic 1000 MG CAPS, Take 1,000 mg by mouth daily., Disp: , Rfl:    Krill Oil Omega-3 300 MG CAPS, Take 300 mg by mouth daily., Disp: , Rfl:    loperamide (IMODIUM) 2 MG capsule, Take 1 capsule (2 mg total) by mouth 4 (four) times daily as needed for diarrhea or loose stools., Disp: 12 capsule, Rfl: 0   metoprolol succinate (TOPROL-XL) 25 MG 24 hr tablet, Take 25 mg by mouth 2 (two) times daily.  Take additional tablet for palpitations, Disp: , Rfl:    Multiple Vitamins-Minerals (MULTIVITAMINS THER. W/MINERALS) TABS, Take 1 tablet by mouth daily.  , Disp: , Rfl:    pantoprazole (PROTONIX) 40 MG tablet, Take 40 mg by mouth daily., Disp: , Rfl:    potassium chloride SA (KLOR-CON M) 20 MEQ tablet, Take 0.5 tablets (10 mEq total) by mouth daily., Disp: 30 tablet,  Rfl: 1   Zinc 50 MG TABS, Take 50 mg by mouth daily., Disp: , Rfl:    atorvastatin (LIPITOR) 10 MG tablet, Take 1 tablet (10 mg total) by mouth daily. (Patient not taking: Reported on 01/25/2024), Disp: 90 tablet, Rfl: 3   ezetimibe (ZETIA) 10 MG tablet, Take 1 tablet (10 mg total) by mouth daily. (Patient not taking: Reported on 01/25/2024), Disp: 90 tablet, Rfl: 3   ASSESSMENT AND PLAN: .      ICD-10-CM   1. Severe mitral regurgitation  I34.0     2. Mitral valve prolapse  I34.1     3. PAF (paroxysmal atrial fibrillation) (HCC)  I48.0       No orders of the defined types were placed in this encounter.   There are no discontinued medications.   Assessment & Plan Severe mitral regurgitation with mitral valve prolapse   Severe mitral regurgitation with mitral valve prolapse is confirmed by symptoms of dyspnea and dizziness. After monitoring for 2-3 years, surgical intervention is now appropriate. Normal cardiac function supports her candidacy for surgery. Open heart surgery is chosen due to concurrent tricuspid valve regurgitation, allowing both issues to be addressed in one procedure. Proceed with mitral valve surgery on April 9th, including open heart surgery to address both mitral and tricuspid valve issues.  Tricuspid valve regurgitation   Tricuspid valve regurgitation will be addressed during the upcoming open heart surgery. Repairing both valves at once resolves both issues in a single procedure. Although tricuspid valve regurgitation may improve post-mitral valve repair, concurrent repair ensures comprehensive treatment. Repair the tricuspid valve during the mitral valve surgery on April 9th.  Atrial fibrillation   Atrial fibrillation with rapid ventricular response is currently controlled with amiodarone and is believed to be secondary to mitral valve  regurgitation. She is on apixaban for anticoagulation. The plan is to clip the left atrial appendage during surgery, potentially  eliminating the need for long-term anticoagulation. Amiodarone use is temporary, with dosage adjustments as needed for symptom control. Continue amiodarone 200 mg once daily and apixaban 5 mg twice daily until surgery. Clip the left atrial appendage during surgery to potentially discontinue anticoagulation.  Peripheral edema   Peripheral edema is managed by reintroducing furosemide (Lasix) by CT surgery Dr. Leafy Ro. She reports improvement in symptoms, including breathing, since resuming medication. Continue furosemide as prescribed by Doctor Lennox Grumbles.      Signed,  Yates Decamp, MD, [facility] 01/25/2024, 11:47 AM [facility] [address] [place], [place] [address] Phone: [phone]. Fax:  [phone]

## 2024-01-26 ENCOUNTER — Encounter: Payer: Self-pay | Admitting: Cardiology

## 2024-01-26 MED ORDER — APIXABAN 5 MG PO TABS: 5.0000 mg | ORAL_TABLET | Freq: Two times a day (BID) | ORAL | 3 refills | Status: DC

## 2024-01-30 ENCOUNTER — Ambulatory Visit (HOSPITAL_COMMUNITY)
Admission: RE | Admit: 2024-01-30 | Discharge: 2024-01-30 | Disposition: A | Discharge status: Home / Self Care | Source: Ambulatory Visit | Attending: Thoracic Surgery (Cardiothoracic Vascular Surgery) | Admitting: Thoracic Surgery (Cardiothoracic Vascular Surgery)

## 2024-01-30 DIAGNOSIS — R918 Other nonspecific abnormal finding of lung field: Secondary | ICD-10-CM | POA: Diagnosis not present

## 2024-01-30 DIAGNOSIS — I700 Atherosclerosis of aorta: Secondary | ICD-10-CM | POA: Diagnosis not present

## 2024-01-30 DIAGNOSIS — I340 Nonrheumatic mitral (valve) insufficiency: Secondary | ICD-10-CM | POA: Insufficient documentation

## 2024-01-31 MED ORDER — APIXABAN 5 MG PO TABS: 5.0000 mg | ORAL_TABLET | Freq: Two times a day (BID) | ORAL | 0 refills | Status: DC

## 2024-01-31 NOTE — Addendum Note (Signed)
 Addended by: Dossie Arbour on: 01/31/2024 10:46 AM   Modules accepted: Orders

## 2024-02-03 ENCOUNTER — Other Ambulatory Visit: Payer: Self-pay | Admitting: Thoracic Surgery (Cardiothoracic Vascular Surgery)

## 2024-02-06 NOTE — Pre-Procedure Instructions (Signed)
 Surgical Instructions   Your procedure is scheduled on Wednesday, April 9th. Report to [facility] Main Entrance "A" at 06:30 A.M., then check in with the Admitting office. Any questions or running late day of surgery: call [phone]  Questions prior to your surgery date: call [phone], Monday-Friday, 8am-4pm. If you experience any cold or flu symptoms such as cough, fever, chills, shortness of breath, etc. between now and your scheduled surgery, please notify us at the above number.     Remember:  Do not eat or drink after midnight the night before your surgery    Take these medicines the morning of surgery with A SIP OF WATER  amiodarone (PACERONE)  Bempedoic Acid-Ezetimibe (NEXLIZET)  metoprolol succinate (TOPROL-XL)  pantoprazole (PROTONIX)    STOP Eliquis 5 days prior to surgery. Last dose 4/3.  One week prior to surgery, STOP taking any Aspirin (unless otherwise instructed by your surgeon) Aleve, Naproxen, Ibuprofen, Motrin, Advil, Goody's, BC's, all herbal medications, fish oil, and non-prescription vitamins.                     Do NOT Smoke (Tobacco/Vaping) for 24 hours prior to your procedure.  If you use a CPAP at night, you may bring your mask/headgear for your overnight stay.   You will be asked to remove any contacts, glasses, piercing's, hearing aid's, dentures/partials prior to surgery. Please bring cases for these items if needed.    Patients discharged the day of surgery will not be allowed to drive home, and someone needs to stay with them for 24 hours.  SURGICAL WAITING ROOM VISITATION Patients may have no more than 2 support people in the waiting area - these visitors may rotate.   Pre-op nurse will coordinate an appropriate time for 1 ADULT support person, who may not rotate, to accompany patient in pre-op.  Children under the age of 47 must have an adult with them who is not the patient and must remain in the main waiting area with an adult.  If the  patient needs to stay at the hospital during part of their recovery, the visitor guidelines for inpatient rooms apply.  Please refer to the [facility] website for the visitor guidelines for any additional information.   If you received a COVID test during your pre-op visit  it is requested that you wear a mask when out in public, stay away from anyone that may not be feeling well and notify your surgeon if you develop symptoms. If you have been in contact with anyone that has tested positive in the last 10 days please notify you surgeon.      Pre-operative CHG Bathing Instructions   You can play a key role in reducing the risk of infection after surgery. Your skin needs to be as free of germs as possible. You can reduce the number of germs on your skin by washing with CHG (chlorhexidine gluconate) soap before surgery. CHG is an antiseptic soap that kills germs and continues to kill germs even after washing.   DO NOT use if you have an allergy to chlorhexidine/CHG or antibacterial soaps. If your skin becomes reddened or irritated, stop using the CHG and notify one of our RNs at [phone].              TAKE A SHOWER THE NIGHT BEFORE SURGERY AND THE DAY OF SURGERY    Please keep in mind the following:  DO NOT shave, including legs and underarms, 48 hours prior to  surgery.   You may shave your face before/day of surgery.  Place clean sheets on your bed the night before surgery Use a clean washcloth (not used since being washed) for each shower. DO NOT sleep with pet's night before surgery.  CHG Shower Instructions:  Wash your face and private area with normal soap. If you choose to wash your hair, wash first with your normal shampoo.  After you use shampoo/soap, rinse your hair and body thoroughly to remove shampoo/soap residue.  Turn the water OFF and apply half the bottle of CHG soap to a CLEAN washcloth.  Apply CHG soap ONLY FROM YOUR NECK DOWN TO YOUR TOES (washing for 3-5 minutes)   DO NOT use CHG soap on face, private areas, open wounds, or sores.  Pay special attention to the area where your surgery is being performed.  If you are having back surgery, having someone wash your back for you may be helpful. Wait 2 minutes after CHG soap is applied, then you may rinse off the CHG soap.  Pat dry with a clean towel  Put on clean pajamas    Additional instructions for the day of surgery: DO NOT APPLY any lotions, deodorants, cologne, or perfumes.   Do not wear jewelry or makeup Do not wear nail polish, gel polish, artificial nails, or any other type of covering on natural nails (fingers and toes) Do not bring valuables to the hospital. [facility] is not responsible for valuables/personal belongings. Put on clean/comfortable clothes.  Please brush your teeth.  Ask your nurse before applying any prescription medications to the skin.

## 2024-02-07 ENCOUNTER — Ambulatory Visit (HOSPITAL_BASED_OUTPATIENT_CLINIC_OR_DEPARTMENT_OTHER)
Admission: RE | Admit: 2024-02-07 | Discharge: 2024-02-07 | Disposition: A | Discharge status: Home / Self Care | Source: Ambulatory Visit | Attending: Thoracic Surgery (Cardiothoracic Vascular Surgery) | Admitting: Thoracic Surgery (Cardiothoracic Vascular Surgery)

## 2024-02-07 ENCOUNTER — Encounter (HOSPITAL_COMMUNITY)
Admission: RE | Admit: 2024-02-07 | Discharge: 2024-02-07 | Disposition: A | Discharge status: Home / Self Care | Source: Ambulatory Visit | Attending: Thoracic Surgery (Cardiothoracic Vascular Surgery)

## 2024-02-07 ENCOUNTER — Encounter (HOSPITAL_COMMUNITY): Payer: Self-pay

## 2024-02-07 ENCOUNTER — Ambulatory Visit (HOSPITAL_COMMUNITY)
Admission: RE | Admit: 2024-02-07 | Discharge: 2024-02-07 | Disposition: A | Discharge status: Home / Self Care | Source: Ambulatory Visit | Attending: Thoracic Surgery (Cardiothoracic Vascular Surgery) | Admitting: Thoracic Surgery (Cardiothoracic Vascular Surgery)

## 2024-02-07 ENCOUNTER — Other Ambulatory Visit: Payer: Self-pay

## 2024-02-07 VITALS — BP 120/57 | HR 57 | Temp 98.0°F | Resp 16 | Ht 64.0 in | Wt 151.0 lb

## 2024-02-07 DIAGNOSIS — Z923 Personal history of irradiation: Secondary | ICD-10-CM | POA: Diagnosis not present

## 2024-02-07 DIAGNOSIS — R0600 Dyspnea, unspecified: Secondary | ICD-10-CM | POA: Diagnosis not present

## 2024-02-07 DIAGNOSIS — N179 Acute kidney failure, unspecified: Secondary | ICD-10-CM | POA: Diagnosis not present

## 2024-02-07 DIAGNOSIS — K219 Gastro-esophageal reflux disease without esophagitis: Secondary | ICD-10-CM | POA: Diagnosis not present

## 2024-02-07 DIAGNOSIS — E871 Hypo-osmolality and hyponatremia: Secondary | ICD-10-CM | POA: Diagnosis not present

## 2024-02-07 DIAGNOSIS — D649 Anemia, unspecified: Secondary | ICD-10-CM | POA: Diagnosis not present

## 2024-02-07 DIAGNOSIS — I341 Nonrheumatic mitral (valve) prolapse: Secondary | ICD-10-CM | POA: Diagnosis present

## 2024-02-07 DIAGNOSIS — R112 Nausea with vomiting, unspecified: Secondary | ICD-10-CM | POA: Diagnosis not present

## 2024-02-07 DIAGNOSIS — R570 Cardiogenic shock: Secondary | ICD-10-CM | POA: Diagnosis not present

## 2024-02-07 DIAGNOSIS — E8770 Fluid overload, unspecified: Secondary | ICD-10-CM | POA: Diagnosis not present

## 2024-02-07 DIAGNOSIS — D72829 Elevated white blood cell count, unspecified: Secondary | ICD-10-CM | POA: Diagnosis not present

## 2024-02-07 DIAGNOSIS — Z48813 Encounter for surgical aftercare following surgery on the respiratory system: Secondary | ICD-10-CM | POA: Diagnosis not present

## 2024-02-07 DIAGNOSIS — Z01818 Encounter for other preprocedural examination: Secondary | ICD-10-CM

## 2024-02-07 DIAGNOSIS — I3481 Nonrheumatic mitral (valve) annulus calcification: Secondary | ICD-10-CM | POA: Diagnosis present

## 2024-02-07 DIAGNOSIS — J90 Pleural effusion, not elsewhere classified: Secondary | ICD-10-CM | POA: Diagnosis not present

## 2024-02-07 DIAGNOSIS — Z79899 Other long term (current) drug therapy: Secondary | ICD-10-CM | POA: Diagnosis not present

## 2024-02-07 DIAGNOSIS — I700 Atherosclerosis of aorta: Secondary | ICD-10-CM | POA: Diagnosis not present

## 2024-02-07 DIAGNOSIS — Q228 Other congenital malformations of tricuspid valve: Secondary | ICD-10-CM | POA: Diagnosis not present

## 2024-02-07 DIAGNOSIS — D62 Acute posthemorrhagic anemia: Secondary | ICD-10-CM | POA: Diagnosis not present

## 2024-02-07 DIAGNOSIS — I071 Rheumatic tricuspid insufficiency: Secondary | ICD-10-CM | POA: Diagnosis not present

## 2024-02-07 DIAGNOSIS — I081 Rheumatic disorders of both mitral and tricuspid valves: Secondary | ICD-10-CM | POA: Diagnosis not present

## 2024-02-07 DIAGNOSIS — I340 Nonrheumatic mitral (valve) insufficiency: Secondary | ICD-10-CM | POA: Insufficient documentation

## 2024-02-07 DIAGNOSIS — D696 Thrombocytopenia, unspecified: Secondary | ICD-10-CM | POA: Diagnosis not present

## 2024-02-07 DIAGNOSIS — J810 Acute pulmonary edema: Secondary | ICD-10-CM | POA: Diagnosis not present

## 2024-02-07 DIAGNOSIS — R140 Abdominal distension (gaseous): Secondary | ICD-10-CM | POA: Diagnosis not present

## 2024-02-07 DIAGNOSIS — L89156 Pressure-induced deep tissue damage of sacral region: Secondary | ICD-10-CM | POA: Diagnosis not present

## 2024-02-07 DIAGNOSIS — I5023 Acute on chronic systolic (congestive) heart failure: Secondary | ICD-10-CM | POA: Diagnosis not present

## 2024-02-07 DIAGNOSIS — I5020 Unspecified systolic (congestive) heart failure: Secondary | ICD-10-CM | POA: Diagnosis not present

## 2024-02-07 DIAGNOSIS — J9811 Atelectasis: Secondary | ICD-10-CM | POA: Diagnosis not present

## 2024-02-07 DIAGNOSIS — I130 Hypertensive heart and chronic kidney disease with heart failure and stage 1 through stage 4 chronic kidney disease, or unspecified chronic kidney disease: Secondary | ICD-10-CM | POA: Diagnosis present

## 2024-02-07 DIAGNOSIS — E222 Syndrome of inappropriate secretion of antidiuretic hormone: Secondary | ICD-10-CM | POA: Diagnosis not present

## 2024-02-07 DIAGNOSIS — Z7401 Bed confinement status: Secondary | ICD-10-CM | POA: Diagnosis not present

## 2024-02-07 DIAGNOSIS — Z452 Encounter for adjustment and management of vascular access device: Secondary | ICD-10-CM | POA: Diagnosis not present

## 2024-02-07 DIAGNOSIS — I5021 Acute systolic (congestive) heart failure: Secondary | ICD-10-CM | POA: Diagnosis not present

## 2024-02-07 DIAGNOSIS — L89316 Pressure-induced deep tissue damage of right buttock: Secondary | ICD-10-CM | POA: Diagnosis not present

## 2024-02-07 DIAGNOSIS — I2510 Atherosclerotic heart disease of native coronary artery without angina pectoris: Secondary | ICD-10-CM | POA: Diagnosis not present

## 2024-02-07 DIAGNOSIS — I272 Pulmonary hypertension, unspecified: Secondary | ICD-10-CM | POA: Diagnosis present

## 2024-02-07 DIAGNOSIS — I4819 Other persistent atrial fibrillation: Secondary | ICD-10-CM | POA: Diagnosis present

## 2024-02-07 DIAGNOSIS — Z4682 Encounter for fitting and adjustment of non-vascular catheter: Secondary | ICD-10-CM | POA: Diagnosis not present

## 2024-02-07 DIAGNOSIS — R739 Hyperglycemia, unspecified: Secondary | ICD-10-CM | POA: Diagnosis not present

## 2024-02-07 DIAGNOSIS — J939 Pneumothorax, unspecified: Secondary | ICD-10-CM | POA: Diagnosis not present

## 2024-02-07 DIAGNOSIS — Z7901 Long term (current) use of anticoagulants: Secondary | ICD-10-CM | POA: Diagnosis not present

## 2024-02-07 DIAGNOSIS — I4891 Unspecified atrial fibrillation: Secondary | ICD-10-CM | POA: Diagnosis not present

## 2024-02-07 DIAGNOSIS — R918 Other nonspecific abnormal finding of lung field: Secondary | ICD-10-CM | POA: Diagnosis not present

## 2024-02-07 DIAGNOSIS — F339 Major depressive disorder, recurrent, unspecified: Secondary | ICD-10-CM | POA: Diagnosis not present

## 2024-02-07 DIAGNOSIS — R0602 Shortness of breath: Secondary | ICD-10-CM | POA: Diagnosis not present

## 2024-02-07 DIAGNOSIS — E785 Hyperlipidemia, unspecified: Secondary | ICD-10-CM | POA: Diagnosis present

## 2024-02-07 DIAGNOSIS — I428 Other cardiomyopathies: Secondary | ICD-10-CM | POA: Diagnosis present

## 2024-02-07 DIAGNOSIS — I361 Nonrheumatic tricuspid (valve) insufficiency: Secondary | ICD-10-CM | POA: Diagnosis present

## 2024-02-07 DIAGNOSIS — R1314 Dysphagia, pharyngoesophageal phase: Secondary | ICD-10-CM | POA: Diagnosis not present

## 2024-02-07 DIAGNOSIS — M6281 Muscle weakness (generalized): Secondary | ICD-10-CM | POA: Diagnosis not present

## 2024-02-07 DIAGNOSIS — N390 Urinary tract infection, site not specified: Secondary | ICD-10-CM | POA: Diagnosis not present

## 2024-02-07 DIAGNOSIS — I959 Hypotension, unspecified: Secondary | ICD-10-CM | POA: Diagnosis not present

## 2024-02-07 DIAGNOSIS — I480 Paroxysmal atrial fibrillation: Secondary | ICD-10-CM | POA: Diagnosis not present

## 2024-02-07 DIAGNOSIS — I10 Essential (primary) hypertension: Secondary | ICD-10-CM | POA: Diagnosis not present

## 2024-02-07 DIAGNOSIS — E876 Hypokalemia: Secondary | ICD-10-CM | POA: Diagnosis not present

## 2024-02-07 DIAGNOSIS — Z954 Presence of other heart-valve replacement: Secondary | ICD-10-CM | POA: Diagnosis not present

## 2024-02-07 DIAGNOSIS — R338 Other retention of urine: Secondary | ICD-10-CM | POA: Diagnosis not present

## 2024-02-07 DIAGNOSIS — E162 Hypoglycemia, unspecified: Secondary | ICD-10-CM | POA: Diagnosis not present

## 2024-02-07 DIAGNOSIS — R0989 Other specified symptoms and signs involving the circulatory and respiratory systems: Secondary | ICD-10-CM | POA: Diagnosis not present

## 2024-02-07 DIAGNOSIS — R339 Retention of urine, unspecified: Secondary | ICD-10-CM | POA: Diagnosis not present

## 2024-02-07 DIAGNOSIS — R531 Weakness: Secondary | ICD-10-CM | POA: Diagnosis not present

## 2024-02-07 HISTORY — DX: Gastro-esophageal reflux disease without esophagitis: K21.9

## 2024-02-07 HISTORY — DX: Rheumatic tricuspid insufficiency: I07.1

## 2024-02-07 HISTORY — DX: Unspecified atrial fibrillation: I48.91

## 2024-02-07 HISTORY — DX: Nonrheumatic mitral (valve) prolapse: I34.1

## 2024-02-07 LAB — PROTIME-INR
INR: 1 (ref 0.8–1.2)
Prothrombin Time: 13.5 s (ref 11.4–15.2)

## 2024-02-07 LAB — CBC
HCT: 39.6 % (ref 36.0–46.0)
Hemoglobin: 12.8 g/dL (ref 12.0–15.0)
MCH: 30.3 pg (ref 26.0–34.0)
MCHC: 32.3 g/dL (ref 30.0–36.0)
MCV: 93.8 fL (ref 80.0–100.0)
Platelets: 227 10*3/uL (ref 150–400)
RBC: 4.22 MIL/uL (ref 3.87–5.11)
RDW: 20.5 % — ABNORMAL HIGH (ref 11.5–15.5)
WBC: 8.2 10*3/uL (ref 4.0–10.5)
nRBC: 0 % (ref 0.0–0.2)

## 2024-02-07 LAB — COMPREHENSIVE METABOLIC PANEL WITH GFR
ALT: 15 U/L (ref 0–44)
AST: 22 U/L (ref 15–41)
Albumin: 3.8 g/dL (ref 3.5–5.0)
Alkaline Phosphatase: 24 U/L — ABNORMAL LOW (ref 38–126)
Anion gap: 8 (ref 5–15)
BUN: 23 mg/dL (ref 8–23)
CO2: 26 mmol/L (ref 22–32)
Calcium: 9 mg/dL (ref 8.9–10.3)
Chloride: 103 mmol/L (ref 98–111)
Creatinine, Ser: 1.15 mg/dL — ABNORMAL HIGH (ref 0.44–1.00)
GFR, Estimated: 48 mL/min — ABNORMAL LOW (ref >60)
Glucose, Bld: 100 mg/dL — ABNORMAL HIGH (ref 70–99)
Potassium: 4.5 mmol/L (ref 3.5–5.1)
Sodium: 137 mmol/L (ref 135–145)
Total Bilirubin: 0.7 mg/dL (ref 0.0–1.2)
Total Protein: 6.1 g/dL — ABNORMAL LOW (ref 6.5–8.1)

## 2024-02-07 LAB — URINALYSIS, ROUTINE W REFLEX MICROSCOPIC
Bilirubin Urine: NEGATIVE
Glucose, UA: NEGATIVE mg/dL
Hgb urine dipstick: NEGATIVE
Ketones, ur: NEGATIVE mg/dL
Leukocytes,Ua: NEGATIVE
Nitrite: NEGATIVE
Protein, ur: NEGATIVE mg/dL
Specific Gravity, Urine: 1.014 (ref 1.005–1.030)
pH: 5 (ref 5.0–8.0)

## 2024-02-07 LAB — APTT: aPTT: 28 s (ref 24–36)

## 2024-02-07 LAB — HEMOGLOBIN A1C
Hgb A1c MFr Bld: 4.7 % — ABNORMAL LOW (ref 4.8–5.6)
Mean Plasma Glucose: 88.19 mg/dL

## 2024-02-07 LAB — SURGICAL PCR SCREEN
MRSA, PCR: NEGATIVE
Staphylococcus aureus: NEGATIVE

## 2024-02-07 MED ORDER — MILRINONE LACTATE IN DEXTROSE 20-5 MG/100ML-% IV SOLN
0.3000 ug/kg/min | INTRAVENOUS | Status: DC
Start: 2024-02-08 — End: 2024-02-09
  Filled 2024-02-07: qty 100

## 2024-02-07 MED ORDER — HEPARIN 30,000 UNITS/1000 ML (OHS) CELLSAVER SOLUTION
Status: DC
  Filled 2024-02-07: qty 1000

## 2024-02-07 MED ORDER — TRANEXAMIC ACID (OHS) BOLUS VIA INFUSION
15.0000 mg/kg | INTRAVENOUS | Status: AC
  Administered 2024-02-08: 1027.5 mg via INTRAVENOUS
  Filled 2024-02-07: qty 1028

## 2024-02-07 MED ORDER — MANNITOL 20 % IV SOLN
INTRAVENOUS | Status: DC
  Filled 2024-02-07: qty 13

## 2024-02-07 MED ORDER — CEFAZOLIN SODIUM-DEXTROSE 2-4 GM/100ML-% IV SOLN
2.0000 g | INTRAVENOUS | Status: AC
  Administered 2024-02-08: 2 g via INTRAVENOUS
  Filled 2024-02-07: qty 100

## 2024-02-07 MED ORDER — INSULIN REGULAR(HUMAN) IN NACL 100-0.9 UT/100ML-% IV SOLN
INTRAVENOUS | Status: AC
  Administered 2024-02-08: .6 [IU]/h via INTRAVENOUS
  Filled 2024-02-07: qty 100

## 2024-02-07 MED ORDER — SODIUM CHLORIDE 0.9 % IV SOLN
1.5000 mg/kg/h | INTRAVENOUS | Status: AC
  Administered 2024-02-08: 1.5 mg/kg/h via INTRAVENOUS
  Filled 2024-02-07: qty 25

## 2024-02-07 MED ORDER — TRANEXAMIC ACID (OHS) PUMP PRIME SOLUTION
2.0000 mg/kg | INTRAVENOUS | Status: DC
  Filled 2024-02-07: qty 1.37

## 2024-02-07 MED ORDER — PHENYLEPHRINE HCL-NACL 20-0.9 MG/250ML-% IV SOLN
30.0000 ug/min | INTRAVENOUS | Status: AC
  Administered 2024-02-08: 20 ug/min via INTRAVENOUS
  Filled 2024-02-07: qty 250

## 2024-02-07 MED ORDER — VANCOMYCIN HCL 1000 MG IV SOLR
INTRAVENOUS | Status: DC
  Filled 2024-02-07: qty 20

## 2024-02-07 MED ORDER — PLASMA-LYTE A IV SOLN
INTRAVENOUS | Status: DC
  Filled 2024-02-07: qty 2.5

## 2024-02-07 MED ORDER — VANCOMYCIN HCL 1250 MG/250ML IV SOLN
1250.0000 mg | INTRAVENOUS | Status: AC
Start: 2024-02-08 — End: 2024-02-09
  Administered 2024-02-08: 1250 mg via INTRAVENOUS
  Filled 2024-02-07: qty 250

## 2024-02-07 MED ORDER — DEXMEDETOMIDINE HCL IN NACL 400 MCG/100ML IV SOLN
0.1000 ug/kg/h | INTRAVENOUS | Status: AC
  Administered 2024-02-08: .4 ug/kg/h via INTRAVENOUS
  Filled 2024-02-07: qty 100

## 2024-02-07 MED ORDER — NITROGLYCERIN IN D5W 200-5 MCG/ML-% IV SOLN
2.0000 ug/min | INTRAVENOUS | Status: DC
  Filled 2024-02-07: qty 250

## 2024-02-07 MED ORDER — NOREPINEPHRINE 4 MG/250ML-% IV SOLN
0.0000 ug/min | INTRAVENOUS | Status: DC
  Filled 2024-02-07: qty 250

## 2024-02-07 MED ORDER — POTASSIUM CHLORIDE 2 MEQ/ML IV SOLN
80.0000 meq | INTRAVENOUS | Status: DC
  Filled 2024-02-07: qty 40

## 2024-02-07 MED ORDER — EPINEPHRINE HCL 5 MG/250ML IV SOLN IN NS
0.0000 ug/min | INTRAVENOUS | Status: DC
  Filled 2024-02-07: qty 250

## 2024-02-07 NOTE — H&P (Signed)
 [address]       [place] [address]             [phone]                                   [facility] Medical Record #[id number] Date of Birth: May 19, 1941   Yates Decamp, MD Merri Brunette, MD   Chief Complaint: Increasing SOB and MR     History of Present Illness:     Pt is a very pleasant 83 yo female who has been followed for MR over the past few years which started off as mild and recently been found to have severe MR. She gets SOB with activity and has noted increasing lower ext edema. She was seen and felt best to be served with surgical intervention and had TEE with confirmation of severe MR from posterior leaflet prolapse/flail and moderate TR with an EF of 60%. She underwent cath with no CAD and mild PHTN. She then developed afib post cath and was admitted and chemically cardioverted and started on eliquis. She denies CP or lightheadedness           Past Medical History:  Diagnosis Date   Anemia     Arthritis     Cataract     Diverticulosis of colon     Endometrial cancer (HCC)      1A grade endometrial ca   Essential hypertension 01/24/2009    Qualifier: Diagnosis of  By: Nelson-Smith CMA (AAMA), Dottie     Hx of radiation therapy 04/24/08-05/29/08& 8/12/,8/19,&06/27/08    external beam  through 7/09 ,then intracavity in 06/2008   Hypertension     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               Past Surgical History:  Procedure Laterality Date   APPENDECTOMY        1967   arthroscopic knee right surgery       ECTOPIC PREGNANCY SURGERY   1967   iron infusion   12/2022   LAPAROSCOPIC HYSTERECTOMY        total   ORTHOPEDIC SURGERY        Orthoscopic Knee Surgery   RIGHT/LEFT HEART CATH AND CORONARY ANGIOGRAPHY N/A 12/30/2023    Procedure: RIGHT/LEFT HEART CATH AND CORONARY ANGIOGRAPHY;  Surgeon: Yates Decamp, MD;  Location: [facility] INVASIVE CV LAB;  Service: Cardiovascular;  Laterality: N/A;   TEAR DUCT PROBING       TOOTH  EXTRACTION        bone graft   TRANSESOPHAGEAL ECHOCARDIOGRAM (CATH LAB) N/A 12/30/2023    Procedure: TRANSESOPHAGEAL ECHOCARDIOGRAM;  Surgeon: Parke Poisson, MD;  Location: [facility] INVASIVE CV LAB;  Service: Cardiovascular;  Laterality: N/A;          Tobacco Use History  Social History       Tobacco Use  Smoking Status Never  Smokeless Tobacco Never      Social History        Substance and Sexual Activity  Alcohol Use Yes   Alcohol/week: 0.0 standard drinks of alcohol    Comment: less then 2 a month      Social History         Socioeconomic History   Marital status: Married      Spouse name: Not on file   Number of children: 1   Years of education: Not  on file   Highest education level: Not on file  Occupational History   Occupation: Environmental health practitioner  Tobacco Use   Smoking status: Never   Smokeless tobacco: Never  Vaping Use   Vaping status: Never Used  Substance and Sexual Activity   Alcohol use: Yes      Alcohol/week: 0.0 standard drinks of alcohol      Comment: less then 2 a month   Drug use: No   Sexual activity: Never  Other Topics Concern   Not on file  Social History Narrative   Not on file    Social Drivers of Health        Financial Resource Strain: Not on file  Food Insecurity: No Food Insecurity (12/30/2023)    Hunger Vital Sign     Worried About Running Out of Food in the Last Year: Never true     Ran Out of Food in the Last Year: Never true  Transportation Needs: No Transportation Needs (12/30/2023)    PRAPARE - Therapist, art (Medical): No     Lack of Transportation (Non-Medical): No  Physical Activity: Not on file  Stress: Not on file  Social Connections: Socially Integrated (12/30/2023)    Social Connection and Isolation Panel [NHANES]     Frequency of Communication with Friends and Family: More than three times a week     Frequency of Social Gatherings with Friends and Family: More than three times  a week     Attends Religious Services: More than 4 times per year     Active Member of [employer] or Organizations: Yes     Attends Engineer, structural: More than 4 times per year     Marital Status: Married  Catering manager Violence: Not At Risk (12/30/2023)    Humiliation, Afraid, Rape, and Kick questionnaire     Fear of Current or Ex-Partner: No     Emotionally Abused: No     Physically Abused: No     Sexually Abused: No      Allergies       Allergies  Allergen Reactions   Crestor [Rosuvastatin] Other (See Comments)      Leg cramps   Glycopyrrolate        Other reaction(s): extreme dry mouth   Codeine Other (See Comments)      Hyper              Current Outpatient Medications  Medication Sig Dispense Refill   amiodarone (PACERONE) 200 MG tablet Take 2 tablets (400 mg) twice daily until 3/11. Then, take 1 tablet (200 mg) daily 90 tablet 1   apixaban (ELIQUIS) 5 MG TABS tablet Take 1 tablet (5 mg total) by mouth 2 (two) times daily. 60 tablet 11   atorvastatin (LIPITOR) 10 MG tablet Take 1 tablet (10 mg total) by mouth daily. 90 tablet 3   b complex vitamins tablet Take 1 tablet by mouth daily.         Bempedoic Acid-Ezetimibe (NEXLIZET) 180-10 MG TABS Take 1 tablet by mouth daily.       CALCIUM PO Take 650 mg by mouth daily.       Cholecalciferol (VITAMIN D3) 125 MCG (5000 UT) CAPS Take 5,000 Units by mouth daily.       denosumab (PROLIA) 60 MG/ML SOSY injection Inject 60 mg into the skin every 6 (six) months.       ezetimibe (ZETIA) 10 MG tablet Take 1 tablet (10 mg  total) by mouth daily. 90 tablet 3   Garlic 1000 MG CAPS Take 1,000 mg by mouth daily.       Krill Oil Omega-3 300 MG CAPS Take 300 mg by mouth daily.       loperamide (IMODIUM) 2 MG capsule Take 1 capsule (2 mg total) by mouth 4 (four) times daily as needed for diarrhea or loose stools. 12 capsule 0   metoprolol succinate (TOPROL-XL) 25 MG 24 hr tablet Take 25 mg by mouth 2 (two) times daily.  Take  additional tablet for palpitations       Multiple Vitamins-Minerals (MULTIVITAMINS THER. W/MINERALS) TABS Take 1 tablet by mouth daily.         pantoprazole (PROTONIX) 40 MG tablet Take 40 mg by mouth daily.       Zinc 50 MG TABS Take 50 mg by mouth daily.          No current facility-administered medications for this visit.               Family History  Problem Relation Age of Onset   Diabetes type II Mother     Breast cancer Mother     Leukemia Father     Diabetes type II Sister     Breast cancer Sister     Cancer Brother 92        throat 10/2020   Pancreatic cancer Maternal Aunt          x2   Diabetes Maternal Grandmother     Diabetes Maternal Grandfather     Colon cancer Neg Hx     Stomach cancer Neg Hx     Esophageal cancer Neg Hx                  Physical Exam: Teeth in good repair Lungs; clear Card: RR with soft systolic murmru Ext: 2 + edema Neuro: alert and oriented no focal deficits         Diagnostic Studies & Laboratory data: I have personally reviewed the following studies and agree with the findings   TTE (11/2023) IMPRESSIONS     1. Left ventricular ejection fraction, by estimation, is 60 to 65%. The  left ventricle has normal function. The left ventricle has no regional  wall motion abnormalities. The left ventricular internal cavity size was  mildly dilated. Left ventricular  diastolic parameters are indeterminate. The average left ventricular  global longitudinal strain is -23.3 %. The global longitudinal strain is  normal.   2. Right ventricular systolic function is normal. The right ventricular  size is mildly enlarged. There is moderately elevated pulmonary artery  systolic pressure. The estimated right ventricular systolic pressure is  48.2 mmHg.   3. Left atrial size was severely dilated.   4. Right atrial size was mild to moderately dilated.   5. The mitral valve is degenerative. Severe mitral valve regurgitation.  There is  prolapse of both leaflets of the mitral valve.   6. The tricuspid valve is degenerative. Tricuspid valve regurgitation is  moderate.   7. The aortic valve is normal in structure. Aortic valve regurgitation is  mild. Aortic valve sclerosis is present (LCC less mobile), with no  evidence of aortic valve stenosis.   8. The inferior vena cava is normal in size with <50% respiratory  variability, suggesting right atrial pressure of 8 mmHg.   Comparison(s): No prior Echocardiogram.   FINDINGS   Left Ventricle: Left ventricular ejection fraction, by estimation, is 60  to  65%. The left ventricle has normal function. The left ventricle has no  regional wall motion abnormalities. The average left ventricular global  longitudinal strain is -23.3 %.  The global longitudinal strain is normal. The left ventricular internal  cavity size was mildly dilated. There is no left ventricular hypertrophy.  Left ventricular diastolic parameters are indeterminate. Normal left  ventricular filling pressure.   Right Ventricle: The right ventricular size is mildly enlarged. No  increase in right ventricular wall thickness. Right ventricular systolic  function is normal. There is moderately elevated pulmonary artery systolic  pressure. The tricuspid regurgitant  velocity is 3.17 m/s, and with an assumed right atrial pressure of 8 mmHg,  the estimated right ventricular systolic pressure is 48.2 mmHg.   Left Atrium: Left atrial size was severely dilated.   Right Atrium: Right atrial size was mild to moderately dilated.   Pericardium: There is no evidence of pericardial effusion.   Mitral Valve: The mitral valve is degenerative in appearance. There is  prolapse of both leaflets of the mitral valve. Mild mitral annular  calcification. Severe mitral valve regurgitation, with centrally-directed  jet.   Tricuspid Valve: The tricuspid valve is degenerative in appearance.  Tricuspid valve regurgitation is moderate  . No evidence of tricuspid  stenosis.   Aortic Valve: The aortic valve is normal in structure. Aortic valve  regurgitation is mild. Aortic valve sclerosis is present (LCC less  mobile), with no evidence of aortic valve stenosis.   Pulmonic Valve: The pulmonic valve was not well visualized. Pulmonic valve  regurgitation is not visualized. No evidence of pulmonic stenosis.   Aorta: The aortic root is normal in size and structure.   Venous: The inferior vena cava is normal in size with less than 50%  respiratory variability, suggesting right atrial pressure of 8 mmHg.   IAS/Shunts: The atrial septum is grossly normal.     LEFT VENTRICLE  PLAX 2D  LVIDd:         5.45 cm   Diastology  LVIDs:         3.65 cm   LV e' medial:    8.38 cm/s  LV PW:         0.60 cm   LV E/e' medial:  9.5  LV IVS:        0.55 cm   LV e' lateral:   16.60 cm/s  LVOT diam:     2.00 cm   LV E/e' lateral: 4.8  LV SV:         54  LV SV Index:   29        2D Longitudinal Strain  LVOT Area:     3.14 cm  2D Strain GLS (A2C):   -21.1 %                           2D Strain GLS (A3C):   -21.4 %                           2D Strain GLS (A4C):   -27.5 %                           2D Strain GLS Avg:     -23.3 %  3D Volume EF:                           3D EF:        69 %                           LV EDV:       143 ml                           LV ESV:       44 ml                           LV SV:        99 ml   RIGHT VENTRICLE  RV Basal diam:  4.00 cm  RV Mid diam:    4.20 cm  RV S prime:     13.40 cm/s  TAPSE (M-mode): 2.8 cm  RVSP:           48.2 mmHg   LEFT ATRIUM              Index        RIGHT ATRIUM           Index  LA diam:        4.60 cm  2.52 cm/m   RA Pressure: 8.00 mmHg  LA Vol (A2C):   110.0 ml 60.29 ml/m  RA Area:     23.50 cm  LA Vol (A4C):   81.6 ml  44.72 ml/m  RA Volume:   72.60 ml  39.79 ml/m  LA Biplane Vol: 96.0 ml  52.62 ml/m   AORTIC VALVE  LVOT Vmax:   78.35  cm/s  LVOT Vmean:  50.850 cm/s  LVOT VTI:    0.171 m    AORTA  Ao Root diam: 3.00 cm  Ao Asc diam:  3.50 cm   MITRAL VALVE                  TRICUSPID VALVE  MV Area (PHT): cm            TR Peak grad:   40.2 mmHg  MV Decel Time: 162 msec       TR Vmax:        317.00 cm/s  MR Peak grad:    117.5 mmHg   Estimated RAP:  8.00 mmHg  MR Mean grad:    77.0 mmHg    RVSP:           48.2 mmHg  MR Vmax:         542.00 cm/s  MR Vmean:        413.3 cm/s   SHUNTS  MR PISA:         1.57 cm     Systemic VTI:  0.17 m  MR PISA Eff ROA: 9 mm        Systemic Diam: 2.00 cm  MR PISA Radius:  0.50 cm  MV E velocity: 79.65 cm/s  MV A velocity: 42.15 cm/s  MV E/A ratio:  1.89    TEE (12/2023) IMPRESSIONS     1. Left ventricular ejection fraction, by estimation, is 60 to 65%. The  left ventricle has normal function.   2. Right ventricular systolic function is normal. The right ventricular  size is normal.   3. Left  atrial size was severely dilated. No left atrial/left atrial  appendage thrombus was detected.   4. 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  Valve is degenerative and myxomatous, with bileaflet prolapse, posterior >  anterior. There is more prominent P1 scallop prolapse with anterior  leaflet override. There is prolapse of A2   scallop as well. The mitral valve is myxomatous. Severe mitral valve  regurgitation. No evidence of mitral stenosis. The mean mitral valve  gradient is 2.0 mmHg with average heart rate of 71 bpm.   5. Tricuspid valve is mildly thickened and likely has degenerative  change. No significant prolapse noted. Tricuspid valve regurgitation is  moderate.   6. The aortic valve is tricuspid. There is mild calcification of the  aortic valve. There is mild thickening of the aortic valve. Aortic valve  regurgitation is trivial. No aortic stenosis is present.   7. Possible tiny PFO with left to right shunt by color flow Doppler.  Agitated saline  contrast bubble study was negative, with no evidence of  any left to right interatrial shunt.   8. There is Moderate (Grade III) plaque involving the aortic arch and  descending aorta.   9. 3D performed of mitral valve, tricuspid valve and demonstrates  myxomatous mitral valve with prolapse.  10. Rhythm strip during this exam demonstrates normal sinus rhythm.   FINDINGS   Left Ventricle: Left ventricular ejection fraction, by estimation, is 60  to 65%. The left ventricle has normal function. The left ventricular  internal cavity size was normal in size.   Right Ventricle: The right ventricular size is normal. No increase in  right ventricular wall thickness. Right ventricular systolic function is  normal.   Left Atrium: Left atrial size was severely dilated. No left atrial/left  atrial appendage thrombus was detected.   Right Atrium: Right atrial size was normal in size.   Pericardium: Trivial pericardial effusion is present.   Mitral Valve: Central, commissural severe MR with mixed mitral valve  pathology. Valve is degenerative and myxomatous, with bileaflet prolapse,  posterior > anterior. There is more prominent P1 scallop prolapse with  anterior leaflet override. There is  prolapse of A2 scallop as well. The mitral valve is myxomatous. Severe  mitral valve regurgitation. No evidence of mitral valve stenosis. MV peak  gradient, 4.0 mmHg. The mean mitral valve gradient is 2.0 mmHg with  average heart rate of 71 bpm.   Tricuspid Valve: Tricuspid valve is mildly thickened and likely has  degenerative change. No significant prolapse noted. The tricuspid valve is  grossly normal. Tricuspid valve regurgitation is moderate.   Aortic Valve: The aortic valve is tricuspid. There is mild calcification  of the aortic valve. There is mild thickening of the aortic valve. Aortic  valve regurgitation is trivial. No aortic stenosis is present.   Pulmonic Valve: The pulmonic valve was  normal in structure. Pulmonic valve  regurgitation is not visualized.   Aorta: The aortic root and ascending aorta are structurally normal, with  no evidence of dilitation. There is moderate (Grade III) plaque involving  the aortic arch and descending aorta.   Venous: A pattern of systolic flow reversal, suggestive of severe mitral  regurgitation is recorded from the left upper pulmonary vein and the left  lower pulmonary vein.   IAS/Shunts: Possible tiny PFO with left to right shunt by color flow  Doppler. Agitated saline contrast was given intravenously to evaluate for  intracardiac shunting. Agitated saline contrast bubble study was negative,  with no evidence of any interatrial  shunt.   EKG: Rhythm strip during this exam demonstrates normal sinus rhythm.   Additional Comments: 3D was performed not requiring image post processing  on an independent workstation and was abnormal. Spectral Doppler  performed.   LEFT VENTRICLE  PLAX 2D  LVOT diam:     2.00 cm  LV SV:         54  LV SV Index:   31  LVOT Area:     3.14 cm     AORTIC VALVE  LVOT Vmax:   76.17 cm/s  LVOT Vmean:  47.196 cm/s  LVOT VTI:    0.171 m    AORTA  Ao Root diam: 3.10 cm  Ao Asc diam:  3.30 cm   MITRAL VALVE                  TRICUSPID VALVE  MV Peak grad: 4.0 mmHg        TR Peak grad:   68.6 mmHg  MV Mean grad: 2.0 mmHg        TR Mean grad:   36.0 mmHg  MV Vmax:      1.00 m/s        TR Vmax:        414.00 cm/s  MV Vmean:     57.8 cm/s       TR Vmean:       278.0 cm/s  MR Peak grad:    110.7 mmHg  MR Mean grad:    75.0 mmHg    SHUNTS  MR Vmax:         526.00 cm/s  Systemic VTI:  0.17 m  MR Vmean:        407.0 cm/s   Systemic Diam: 2.00 cm  MR PISA:         3.53 cm  MR PISA Eff ROA: 26 mm  MR PISA Radius:  0.75 cm    CATH(12/2023) Conclusion   Right & Left Heart Catheterization 12/30/23: Hemodynamic data: Right heart catheterization consistent with mild pulmonary hypertension with normal  LVEDP, prominent V waves on wedge pressure tracing suggests significant MR, both right upper pulmonary and left upper pulmonary veins were interrogated, approximate A wave 12 mmHg and V waves 22 mmHg. QP/QS 1.00, PAPi 3.0 suggest preserved RV function. CO 7.76, CI 4.41, elevated due to significant MR probably. LVEDP 11 mmHg, no pressure differential between aorta and LV, no suggestion of aortic stenosis.   Angiographic data: RCA: Superdominant vessel.  It is tortuous in the midsegment.  Otherwise smooth and normal. LM: Large-caliber vessel smooth and normal. LAD: Ends before reaching the LV apex, gives origin to large D1 with secondary branches, has minimal disease in the midsegment. LCx: Small vessel, small OM1.      Impression and recommendations: Findings suggestive of severe MR.  Will await on formal reports on TEE and make final recommendations.    Recent Radiology Findings:        Recent Lab Findings: Recent Labs       Lab Results  Component Value Date    WBC 6.3 12/31/2023    HGB 9.5 (L) 12/31/2023    HCT 29.0 (L) 12/31/2023    PLT 133 (L) 12/31/2023    GLUCOSE 102 (H) 12/31/2023    CHOL 104 04/29/2021    TRIG 157 (H) 04/29/2021    HDL 38 (L) 04/29/2021    LDLCALC 39 04/29/2021    ALT 16 12/30/2023  AST 26 12/30/2023    NA 136 12/31/2023    K 3.5 12/31/2023    CL 105 12/31/2023    CREATININE 0.81 12/31/2023    BUN 10 12/31/2023    CO2 23 12/31/2023    TSH 3.482 12/30/2023            Assessment / Plan:     83 yo female with NYHA class II symptoms of severe MR, moderate TR with normal LV function and no CAD and no PHTN. Pt developed afib after cath. She has a class I indication for MV repair and will also have TV repair and pulm vein isolation maze with occlusion of LAA. She understands all the risks and goals and recovery from this and wishes to proceed. Will need to be off her eliquis for 5 days prior to surgery planned on 4/9. She will have as a back up to  repair tissue valve replacement which may be needed in 5%.  Will start her on Lasix 20mg  day with potasium secondary to her increase in lower ext edema prior to surgery.

## 2024-02-07 NOTE — Progress Notes (Addendum)
 PCP - Dr. Merri Brunette Cardiologist - Yates Decamp  PPM/ICD - denies Device Orders - n/a Rep Notified - n/a  Chest x-ray - 02/07/24 EKG - 01/13/24 Stress Test - 11/12/19 ECHO - 12/30/23 Cardiac Cath - 12/30/23  Sleep Study - denies CPAP - n/a  No DM  Last dose of GLP1 agonist-  n/a GLP1 instructions:  n/a  Blood Thinner Instructions:  last dose of Eliquis was 4/3 Aspirin Instructions: n/a  ERAS Protcol -NPO   COVID TEST- n/a   Anesthesia review:  no  Patient denies shortness of breath, fever, cough and chest pain at PAT appointment   All instructions explained to the patient, with a verbal understanding of the material. Patient agrees to go over the instructions while at home for a better understanding. Patient also instructed to self quarantine after being tested for COVID-19. The opportunity to ask questions was provided.  Notified Darius Bump, RN of patient's creatinine.  Dr. Leafy Ro aware.  No new orders received.

## 2024-02-08 ENCOUNTER — Inpatient Hospital Stay (HOSPITAL_COMMUNITY)

## 2024-02-08 ENCOUNTER — Encounter (HOSPITAL_COMMUNITY): Payer: Self-pay | Admitting: Thoracic Surgery (Cardiothoracic Vascular Surgery)

## 2024-02-08 ENCOUNTER — Inpatient Hospital Stay (HOSPITAL_COMMUNITY): Payer: Self-pay | Admitting: Anesthesiology

## 2024-02-08 ENCOUNTER — Encounter (HOSPITAL_COMMUNITY)
Admission: RE | Disposition: A | Discharge status: Skilled Nursing Facility | Payer: Self-pay | Source: Home / Self Care | Attending: Thoracic Surgery (Cardiothoracic Vascular Surgery)

## 2024-02-08 ENCOUNTER — Other Ambulatory Visit: Payer: Self-pay

## 2024-02-08 ENCOUNTER — Inpatient Hospital Stay (HOSPITAL_COMMUNITY)
Admission: RE | Admit: 2024-02-08 | Discharge: 2024-03-10 | DRG: 219 | Disposition: A | Discharge status: Skilled Nursing Facility | Attending: Thoracic Surgery (Cardiothoracic Vascular Surgery) | Admitting: Thoracic Surgery (Cardiothoracic Vascular Surgery)

## 2024-02-08 DIAGNOSIS — N390 Urinary tract infection, site not specified: Secondary | ICD-10-CM | POA: Diagnosis not present

## 2024-02-08 DIAGNOSIS — I340 Nonrheumatic mitral (valve) insufficiency: Principal | ICD-10-CM | POA: Diagnosis present

## 2024-02-08 DIAGNOSIS — I480 Paroxysmal atrial fibrillation: Secondary | ICD-10-CM | POA: Diagnosis not present

## 2024-02-08 DIAGNOSIS — Z923 Personal history of irradiation: Secondary | ICD-10-CM | POA: Diagnosis not present

## 2024-02-08 DIAGNOSIS — I272 Pulmonary hypertension, unspecified: Secondary | ICD-10-CM | POA: Diagnosis present

## 2024-02-08 DIAGNOSIS — R739 Hyperglycemia, unspecified: Secondary | ICD-10-CM | POA: Diagnosis not present

## 2024-02-08 DIAGNOSIS — Z79899 Other long term (current) drug therapy: Secondary | ICD-10-CM | POA: Diagnosis not present

## 2024-02-08 DIAGNOSIS — R112 Nausea with vomiting, unspecified: Secondary | ICD-10-CM | POA: Diagnosis not present

## 2024-02-08 DIAGNOSIS — I4891 Unspecified atrial fibrillation: Secondary | ICD-10-CM

## 2024-02-08 DIAGNOSIS — R140 Abdominal distension (gaseous): Secondary | ICD-10-CM | POA: Diagnosis not present

## 2024-02-08 DIAGNOSIS — E785 Hyperlipidemia, unspecified: Secondary | ICD-10-CM | POA: Diagnosis present

## 2024-02-08 DIAGNOSIS — E222 Syndrome of inappropriate secretion of antidiuretic hormone: Secondary | ICD-10-CM | POA: Diagnosis not present

## 2024-02-08 DIAGNOSIS — J90 Pleural effusion, not elsewhere classified: Secondary | ICD-10-CM | POA: Diagnosis not present

## 2024-02-08 DIAGNOSIS — I5023 Acute on chronic systolic (congestive) heart failure: Secondary | ICD-10-CM | POA: Diagnosis not present

## 2024-02-08 DIAGNOSIS — D62 Acute posthemorrhagic anemia: Secondary | ICD-10-CM | POA: Diagnosis not present

## 2024-02-08 DIAGNOSIS — Z7901 Long term (current) use of anticoagulants: Secondary | ICD-10-CM

## 2024-02-08 DIAGNOSIS — I130 Hypertensive heart and chronic kidney disease with heart failure and stage 1 through stage 4 chronic kidney disease, or unspecified chronic kidney disease: Secondary | ICD-10-CM | POA: Diagnosis present

## 2024-02-08 DIAGNOSIS — L89316 Pressure-induced deep tissue damage of right buttock: Secondary | ICD-10-CM | POA: Diagnosis not present

## 2024-02-08 DIAGNOSIS — I10 Essential (primary) hypertension: Secondary | ICD-10-CM

## 2024-02-08 DIAGNOSIS — E162 Hypoglycemia, unspecified: Secondary | ICD-10-CM | POA: Diagnosis not present

## 2024-02-08 DIAGNOSIS — J810 Acute pulmonary edema: Secondary | ICD-10-CM | POA: Diagnosis not present

## 2024-02-08 DIAGNOSIS — I428 Other cardiomyopathies: Secondary | ICD-10-CM | POA: Diagnosis present

## 2024-02-08 DIAGNOSIS — I4819 Other persistent atrial fibrillation: Secondary | ICD-10-CM | POA: Diagnosis present

## 2024-02-08 DIAGNOSIS — Z452 Encounter for adjustment and management of vascular access device: Secondary | ICD-10-CM | POA: Diagnosis not present

## 2024-02-08 DIAGNOSIS — B9620 Unspecified Escherichia coli [E. coli] as the cause of diseases classified elsewhere: Secondary | ICD-10-CM | POA: Diagnosis not present

## 2024-02-08 DIAGNOSIS — I081 Rheumatic disorders of both mitral and tricuspid valves: Secondary | ICD-10-CM

## 2024-02-08 DIAGNOSIS — R338 Other retention of urine: Secondary | ICD-10-CM | POA: Diagnosis not present

## 2024-02-08 DIAGNOSIS — R918 Other nonspecific abnormal finding of lung field: Secondary | ICD-10-CM | POA: Diagnosis not present

## 2024-02-08 DIAGNOSIS — I5021 Acute systolic (congestive) heart failure: Secondary | ICD-10-CM | POA: Diagnosis not present

## 2024-02-08 DIAGNOSIS — N179 Acute kidney failure, unspecified: Secondary | ICD-10-CM | POA: Diagnosis not present

## 2024-02-08 DIAGNOSIS — Z8542 Personal history of malignant neoplasm of other parts of uterus: Secondary | ICD-10-CM

## 2024-02-08 DIAGNOSIS — E878 Other disorders of electrolyte and fluid balance, not elsewhere classified: Secondary | ICD-10-CM | POA: Diagnosis not present

## 2024-02-08 DIAGNOSIS — I071 Rheumatic tricuspid insufficiency: Secondary | ICD-10-CM | POA: Diagnosis not present

## 2024-02-08 DIAGNOSIS — J9811 Atelectasis: Secondary | ICD-10-CM | POA: Diagnosis not present

## 2024-02-08 DIAGNOSIS — Q228 Other congenital malformations of tricuspid valve: Secondary | ICD-10-CM

## 2024-02-08 DIAGNOSIS — I341 Nonrheumatic mitral (valve) prolapse: Secondary | ICD-10-CM | POA: Diagnosis present

## 2024-02-08 DIAGNOSIS — D72829 Elevated white blood cell count, unspecified: Secondary | ICD-10-CM | POA: Diagnosis not present

## 2024-02-08 DIAGNOSIS — J939 Pneumothorax, unspecified: Secondary | ICD-10-CM | POA: Diagnosis not present

## 2024-02-08 DIAGNOSIS — I5020 Unspecified systolic (congestive) heart failure: Secondary | ICD-10-CM | POA: Diagnosis not present

## 2024-02-08 DIAGNOSIS — I3481 Nonrheumatic mitral (valve) annulus calcification: Secondary | ICD-10-CM | POA: Diagnosis present

## 2024-02-08 DIAGNOSIS — R0989 Other specified symptoms and signs involving the circulatory and respiratory systems: Secondary | ICD-10-CM | POA: Diagnosis not present

## 2024-02-08 DIAGNOSIS — T443X5A Adverse effect of other parasympatholytics [anticholinergics and antimuscarinics] and spasmolytics, initial encounter: Secondary | ICD-10-CM | POA: Diagnosis not present

## 2024-02-08 DIAGNOSIS — R1310 Dysphagia, unspecified: Secondary | ICD-10-CM | POA: Diagnosis present

## 2024-02-08 DIAGNOSIS — R197 Diarrhea, unspecified: Secondary | ICD-10-CM | POA: Diagnosis not present

## 2024-02-08 DIAGNOSIS — E876 Hypokalemia: Secondary | ICD-10-CM | POA: Diagnosis not present

## 2024-02-08 DIAGNOSIS — D696 Thrombocytopenia, unspecified: Secondary | ICD-10-CM | POA: Diagnosis not present

## 2024-02-08 DIAGNOSIS — R339 Retention of urine, unspecified: Secondary | ICD-10-CM | POA: Diagnosis not present

## 2024-02-08 DIAGNOSIS — E8770 Fluid overload, unspecified: Secondary | ICD-10-CM | POA: Diagnosis not present

## 2024-02-08 DIAGNOSIS — Z9889 Other specified postprocedural states: Principal | ICD-10-CM

## 2024-02-08 DIAGNOSIS — I361 Nonrheumatic tricuspid (valve) insufficiency: Secondary | ICD-10-CM | POA: Diagnosis present

## 2024-02-08 DIAGNOSIS — E871 Hypo-osmolality and hyponatremia: Secondary | ICD-10-CM | POA: Diagnosis not present

## 2024-02-08 DIAGNOSIS — R570 Cardiogenic shock: Secondary | ICD-10-CM | POA: Diagnosis not present

## 2024-02-08 DIAGNOSIS — I700 Atherosclerosis of aorta: Secondary | ICD-10-CM | POA: Diagnosis not present

## 2024-02-08 DIAGNOSIS — R630 Anorexia: Secondary | ICD-10-CM | POA: Diagnosis not present

## 2024-02-08 DIAGNOSIS — L89156 Pressure-induced deep tissue damage of sacral region: Secondary | ICD-10-CM | POA: Diagnosis not present

## 2024-02-08 DIAGNOSIS — Z954 Presence of other heart-valve replacement: Secondary | ICD-10-CM | POA: Diagnosis not present

## 2024-02-08 DIAGNOSIS — Z90710 Acquired absence of both cervix and uterus: Secondary | ICD-10-CM

## 2024-02-08 DIAGNOSIS — Z48813 Encounter for surgical aftercare following surgery on the respiratory system: Secondary | ICD-10-CM | POA: Diagnosis not present

## 2024-02-08 DIAGNOSIS — R0602 Shortness of breath: Secondary | ICD-10-CM | POA: Diagnosis not present

## 2024-02-08 DIAGNOSIS — Z4682 Encounter for fitting and adjustment of non-vascular catheter: Secondary | ICD-10-CM | POA: Diagnosis not present

## 2024-02-08 DIAGNOSIS — R0600 Dyspnea, unspecified: Secondary | ICD-10-CM | POA: Diagnosis not present

## 2024-02-08 DIAGNOSIS — K219 Gastro-esophageal reflux disease without esophagitis: Secondary | ICD-10-CM | POA: Diagnosis present

## 2024-02-08 HISTORY — PX: CLIPPING OF ATRIAL APPENDAGE: SHX5773

## 2024-02-08 HISTORY — PX: MITRAL VALVE REPAIR: SHX2039

## 2024-02-08 HISTORY — PX: TEE WITHOUT CARDIOVERSION: SHX5443

## 2024-02-08 HISTORY — PX: MAZE: SHX5063

## 2024-02-08 LAB — POCT I-STAT 7, (LYTES, BLD GAS, ICA,H+H)
Acid-base deficit: 1 mmol/L (ref 0.0–2.0)
Acid-base deficit: 1 mmol/L (ref 0.0–2.0)
Acid-base deficit: 3 mmol/L — ABNORMAL HIGH (ref 0.0–2.0)
Acid-base deficit: 6 mmol/L — ABNORMAL HIGH (ref 0.0–2.0)
Acid-base deficit: 2 mmol/L (ref 0.0–2.0)
Acid-base deficit: 2 mmol/L (ref 0.0–2.0)
Bicarbonate: 23.3 mmol/L (ref 20.0–28.0)
Bicarbonate: 23.3 mmol/L (ref 20.0–28.0)
Bicarbonate: 21.8 mmol/L (ref 20.0–28.0)
Bicarbonate: 21 mmol/L (ref 20.0–28.0)
Bicarbonate: 24.4 mmol/L (ref 20.0–28.0)
Bicarbonate: 23.9 mmol/L (ref 20.0–28.0)
Calcium, Ion: 0.96 mmol/L — ABNORMAL LOW (ref 1.15–1.40)
Calcium, Ion: 1.32 mmol/L (ref 1.15–1.40)
Calcium, Ion: 1.22 mmol/L (ref 1.15–1.40)
Calcium, Ion: 1.2 mmol/L (ref 1.15–1.40)
Calcium, Ion: 1.13 mmol/L — ABNORMAL LOW (ref 1.15–1.40)
Calcium, Ion: 1.12 mmol/L — ABNORMAL LOW (ref 1.15–1.40)
HCT: 22 % — ABNORMAL LOW (ref 36.0–46.0)
HCT: 20 % — ABNORMAL LOW (ref 36.0–46.0)
HCT: 26 % — ABNORMAL LOW (ref 36.0–46.0)
HCT: 27 % — ABNORMAL LOW (ref 36.0–46.0)
HCT: 24 % — ABNORMAL LOW (ref 36.0–46.0)
HCT: 24 % — ABNORMAL LOW (ref 36.0–46.0)
Hemoglobin: 7.5 g/dL — ABNORMAL LOW (ref 12.0–15.0)
Hemoglobin: 6.8 g/dL — CL (ref 12.0–15.0)
Hemoglobin: 8.8 g/dL — ABNORMAL LOW (ref 12.0–15.0)
Hemoglobin: 9.2 g/dL — ABNORMAL LOW (ref 12.0–15.0)
Hemoglobin: 8.2 g/dL — ABNORMAL LOW (ref 12.0–15.0)
Hemoglobin: 8.2 g/dL — ABNORMAL LOW (ref 12.0–15.0)
O2 Saturation: 100 %
O2 Saturation: 100 %
O2 Saturation: 100 %
O2 Saturation: 99 %
O2 Saturation: 96 %
O2 Saturation: 99 %
Patient temperature: 35.8
Patient temperature: 36
Patient temperature: 36
Patient temperature: 36.2
Potassium: 3.9 mmol/L (ref 3.5–5.1)
Potassium: 4.2 mmol/L (ref 3.5–5.1)
Potassium: 4.1 mmol/L (ref 3.5–5.1)
Potassium: 4.3 mmol/L (ref 3.5–5.1)
Potassium: 4.1 mmol/L (ref 3.5–5.1)
Potassium: 4.1 mmol/L (ref 3.5–5.1)
Sodium: 139 mmol/L (ref 135–145)
Sodium: 139 mmol/L (ref 135–145)
Sodium: 141 mmol/L (ref 135–145)
Sodium: 140 mmol/L (ref 135–145)
Sodium: 142 mmol/L (ref 135–145)
Sodium: 142 mmol/L (ref 135–145)
TCO2: 24 mmol/L (ref 22–32)
TCO2: 24 mmol/L (ref 22–32)
TCO2: 23 mmol/L (ref 22–32)
TCO2: 22 mmol/L (ref 22–32)
TCO2: 26 mmol/L (ref 22–32)
TCO2: 25 mmol/L (ref 22–32)
pCO2 arterial: 33.8 mmHg (ref 32–48)
pCO2 arterial: 33.7 mmHg (ref 32–48)
pCO2 arterial: 35.7 mmHg (ref 32–48)
pCO2 arterial: 43.2 mmHg (ref 32–48)
pCO2 arterial: 45.4 mmHg (ref 32–48)
pCO2 arterial: 41.3 mmHg (ref 32–48)
pH, Arterial: 7.446 (ref 7.35–7.45)
pH, Arterial: 7.447 (ref 7.35–7.45)
pH, Arterial: 7.388 (ref 7.35–7.45)
pH, Arterial: 7.289 — ABNORMAL LOW (ref 7.35–7.45)
pH, Arterial: 7.334 — ABNORMAL LOW (ref 7.35–7.45)
pH, Arterial: 7.367 (ref 7.35–7.45)
pO2, Arterial: 375 mmHg — ABNORMAL HIGH (ref 83–108)
pO2, Arterial: 275 mmHg — ABNORMAL HIGH (ref 83–108)
pO2, Arterial: 188 mmHg — ABNORMAL HIGH (ref 83–108)
pO2, Arterial: 133 mmHg — ABNORMAL HIGH (ref 83–108)
pO2, Arterial: 81 mmHg — ABNORMAL LOW (ref 83–108)
pO2, Arterial: 130 mmHg — ABNORMAL HIGH (ref 83–108)

## 2024-02-08 LAB — BASIC METABOLIC PANEL WITH GFR
Anion gap: 11 (ref 5–15)
BUN: 20 mg/dL (ref 8–23)
CO2: 24 mmol/L (ref 22–32)
Calcium: 7.8 mg/dL — ABNORMAL LOW (ref 8.9–10.3)
Chloride: 106 mmol/L (ref 98–111)
Creatinine, Ser: 0.86 mg/dL (ref 0.44–1.00)
GFR, Estimated: >60 mL/min (ref >60)
Glucose, Bld: 139 mg/dL — ABNORMAL HIGH (ref 70–99)
Potassium: 4.1 mmol/L (ref 3.5–5.1)
Sodium: 141 mmol/L (ref 135–145)

## 2024-02-08 LAB — POCT I-STAT, CHEM 8
BUN: 20 mg/dL (ref 8–23)
BUN: 18 mg/dL (ref 8–23)
BUN: 18 mg/dL (ref 8–23)
BUN: 17 mg/dL (ref 8–23)
BUN: 17 mg/dL (ref 8–23)
Calcium, Ion: 1.2 mmol/L (ref 1.15–1.40)
Calcium, Ion: 1.05 mmol/L — ABNORMAL LOW (ref 1.15–1.40)
Calcium, Ion: 1.19 mmol/L (ref 1.15–1.40)
Calcium, Ion: 1.04 mmol/L — ABNORMAL LOW (ref 1.15–1.40)
Calcium, Ion: 1.33 mmol/L (ref 1.15–1.40)
Chloride: 105 mmol/L (ref 98–111)
Chloride: 101 mmol/L (ref 98–111)
Chloride: 105 mmol/L (ref 98–111)
Chloride: 104 mmol/L (ref 98–111)
Chloride: 105 mmol/L (ref 98–111)
Creatinine, Ser: 1 mg/dL (ref 0.44–1.00)
Creatinine, Ser: 0.8 mg/dL (ref 0.44–1.00)
Creatinine, Ser: 0.9 mg/dL (ref 0.44–1.00)
Creatinine, Ser: 0.9 mg/dL (ref 0.44–1.00)
Creatinine, Ser: 0.9 mg/dL (ref 0.44–1.00)
Glucose, Bld: 97 mg/dL (ref 70–99)
Glucose, Bld: 117 mg/dL — ABNORMAL HIGH (ref 70–99)
Glucose, Bld: 128 mg/dL — ABNORMAL HIGH (ref 70–99)
Glucose, Bld: 133 mg/dL — ABNORMAL HIGH (ref 70–99)
Glucose, Bld: 120 mg/dL — ABNORMAL HIGH (ref 70–99)
HCT: 32 % — ABNORMAL LOW (ref 36.0–46.0)
HCT: 21 % — ABNORMAL LOW (ref 36.0–46.0)
HCT: 29 % — ABNORMAL LOW (ref 36.0–46.0)
HCT: 20 % — ABNORMAL LOW (ref 36.0–46.0)
HCT: 22 % — ABNORMAL LOW (ref 36.0–46.0)
Hemoglobin: 10.9 g/dL — ABNORMAL LOW (ref 12.0–15.0)
Hemoglobin: 7.1 g/dL — ABNORMAL LOW (ref 12.0–15.0)
Hemoglobin: 9.9 g/dL — ABNORMAL LOW (ref 12.0–15.0)
Hemoglobin: 6.8 g/dL — CL (ref 12.0–15.0)
Hemoglobin: 7.5 g/dL — ABNORMAL LOW (ref 12.0–15.0)
Potassium: 4.1 mmol/L (ref 3.5–5.1)
Potassium: 3.9 mmol/L (ref 3.5–5.1)
Potassium: 4.7 mmol/L (ref 3.5–5.1)
Potassium: 4.8 mmol/L (ref 3.5–5.1)
Potassium: 4.2 mmol/L (ref 3.5–5.1)
Sodium: 141 mmol/L (ref 135–145)
Sodium: 137 mmol/L (ref 135–145)
Sodium: 139 mmol/L (ref 135–145)
Sodium: 137 mmol/L (ref 135–145)
Sodium: 139 mmol/L (ref 135–145)
TCO2: 26 mmol/L (ref 22–32)
TCO2: 25 mmol/L (ref 22–32)
TCO2: 26 mmol/L (ref 22–32)
TCO2: 25 mmol/L (ref 22–32)
TCO2: 24 mmol/L (ref 22–32)

## 2024-02-08 LAB — CBC
HCT: 27 % — ABNORMAL LOW (ref 36.0–46.0)
HCT: 26.7 % — ABNORMAL LOW (ref 36.0–46.0)
Hemoglobin: 8.8 g/dL — ABNORMAL LOW (ref 12.0–15.0)
Hemoglobin: 8.8 g/dL — ABNORMAL LOW (ref 12.0–15.0)
MCH: 30.4 pg (ref 26.0–34.0)
MCH: 30.6 pg (ref 26.0–34.0)
MCHC: 32.6 g/dL (ref 30.0–36.0)
MCHC: 33 g/dL (ref 30.0–36.0)
MCV: 93.4 fL (ref 80.0–100.0)
MCV: 92.7 fL (ref 80.0–100.0)
Platelets: 123 10*3/uL — ABNORMAL LOW (ref 150–400)
Platelets: 143 10*3/uL — ABNORMAL LOW (ref 150–400)
RBC: 2.89 MIL/uL — ABNORMAL LOW (ref 3.87–5.11)
RBC: 2.88 MIL/uL — ABNORMAL LOW (ref 3.87–5.11)
RDW: 20.3 % — ABNORMAL HIGH (ref 11.5–15.5)
RDW: 20.2 % — ABNORMAL HIGH (ref 11.5–15.5)
WBC: 21.6 10*3/uL — ABNORMAL HIGH (ref 4.0–10.5)
WBC: 23 10*3/uL — ABNORMAL HIGH (ref 4.0–10.5)
nRBC: 0 % (ref 0.0–0.2)
nRBC: 0 % (ref 0.0–0.2)

## 2024-02-08 LAB — GLUCOSE, CAPILLARY
Glucose-Capillary: 101 mg/dL — ABNORMAL HIGH (ref 70–99)
Glucose-Capillary: 191 mg/dL — ABNORMAL HIGH (ref 70–99)
Glucose-Capillary: 197 mg/dL — ABNORMAL HIGH (ref 70–99)
Glucose-Capillary: 171 mg/dL — ABNORMAL HIGH (ref 70–99)
Glucose-Capillary: 144 mg/dL — ABNORMAL HIGH (ref 70–99)
Glucose-Capillary: 131 mg/dL — ABNORMAL HIGH (ref 70–99)
Glucose-Capillary: 146 mg/dL — ABNORMAL HIGH (ref 70–99)
Glucose-Capillary: 137 mg/dL — ABNORMAL HIGH (ref 70–99)
Glucose-Capillary: 161 mg/dL — ABNORMAL HIGH (ref 70–99)

## 2024-02-08 LAB — PREPARE RBC (CROSSMATCH)

## 2024-02-08 LAB — POCT I-STAT EG7
Acid-Base Excess: 0 mmol/L (ref 0.0–2.0)
Bicarbonate: 24.1 mmol/L (ref 20.0–28.0)
Calcium, Ion: 1.01 mmol/L — ABNORMAL LOW (ref 1.15–1.40)
HCT: 22 % — ABNORMAL LOW (ref 36.0–46.0)
Hemoglobin: 7.5 g/dL — ABNORMAL LOW (ref 12.0–15.0)
O2 Saturation: 75 %
Potassium: 3.6 mmol/L (ref 3.5–5.1)
Sodium: 141 mmol/L (ref 135–145)
TCO2: 25 mmol/L (ref 22–32)
pCO2, Ven: 36.4 mmHg — ABNORMAL LOW (ref 44–60)
pH, Ven: 7.429 (ref 7.25–7.43)
pO2, Ven: 39 mmHg (ref 32–45)

## 2024-02-08 LAB — PROTIME-INR
INR: 1.6 — ABNORMAL HIGH (ref 0.8–1.2)
Prothrombin Time: 19.5 s — ABNORMAL HIGH (ref 11.4–15.2)

## 2024-02-08 LAB — MAGNESIUM: Magnesium: 3.3 mg/dL — ABNORMAL HIGH (ref 1.7–2.4)

## 2024-02-08 LAB — HEMOGLOBIN AND HEMATOCRIT, BLOOD
HCT: 21.8 % — ABNORMAL LOW (ref 36.0–46.0)
Hemoglobin: 7.1 g/dL — ABNORMAL LOW (ref 12.0–15.0)

## 2024-02-08 LAB — APTT: aPTT: 38 s — ABNORMAL HIGH (ref 24–36)

## 2024-02-08 LAB — PLATELET COUNT: Platelets: 177 10*3/uL (ref 150–400)

## 2024-02-08 SURGERY — REPAIR, MITRAL VALVE
Anesthesia: General | Site: Chest

## 2024-02-08 MED ORDER — ALBUMIN HUMAN 5 % IV SOLN: INTRAVENOUS | Status: DC | PRN

## 2024-02-08 MED ORDER — DOCUSATE SODIUM 100 MG PO CAPS
200.0000 mg | ORAL_CAPSULE | Freq: Every day | ORAL | Status: DC
  Administered 2024-02-09 – 2024-02-15 (×5): 200 mg via ORAL
  Filled 2024-02-08 (×7): qty 2

## 2024-02-08 MED ORDER — MIDAZOLAM HCL (PF) 10 MG/2ML IJ SOLN
INTRAMUSCULAR | Status: AC
  Filled 2024-02-08: qty 2

## 2024-02-08 MED ORDER — FENTANYL CITRATE (PF) 250 MCG/5ML IJ SOLN
INTRAMUSCULAR | Status: AC
  Filled 2024-02-08: qty 5

## 2024-02-08 MED ORDER — EZETIMIBE 10 MG PO TABS
10.0000 mg | ORAL_TABLET | Freq: Every day | ORAL | Status: DC
  Administered 2024-02-09 – 2024-03-10 (×31): 10 mg via ORAL
  Filled 2024-02-08 (×31): qty 1

## 2024-02-08 MED ORDER — ACETAMINOPHEN 325 MG PO TABS
650.0000 mg | ORAL_TABLET | Freq: Once | ORAL | Status: AC
  Administered 2024-02-16: 650 mg via ORAL
  Filled 2024-02-08 (×2): qty 2

## 2024-02-08 MED ORDER — CLEVIDIPINE BUTYRATE 0.5 MG/ML IV EMUL
INTRAVENOUS | Status: AC
  Filled 2024-02-08: qty 50

## 2024-02-08 MED ORDER — EPINEPHRINE HCL 5 MG/250ML IV SOLN IN NS
0.0000 ug/min | INTRAVENOUS | Status: DC
  Administered 2024-02-09: 0.5 ug/min via INTRAVENOUS
  Filled 2024-02-08: qty 250

## 2024-02-08 MED ORDER — EPHEDRINE SULFATE-NACL 50-0.9 MG/10ML-% IV SOSY
PREFILLED_SYRINGE | INTRAVENOUS | Status: DC | PRN
  Administered 2024-02-08 (×3): 5 mg via INTRAVENOUS

## 2024-02-08 MED ORDER — SODIUM CHLORIDE 0.9% FLUSH
3.0000 mL | INTRAVENOUS | Status: DC | PRN
  Administered 2024-02-10: 10 mL via INTRAVENOUS

## 2024-02-08 MED ORDER — BISACODYL 5 MG PO TBEC
10.0000 mg | DELAYED_RELEASE_TABLET | Freq: Every day | ORAL | Status: DC
  Administered 2024-02-09 – 2024-02-15 (×5): 10 mg via ORAL
  Filled 2024-02-08 (×7): qty 2

## 2024-02-08 MED ORDER — SODIUM CHLORIDE (PF) 0.9 % IJ SOLN
OROMUCOSAL | Status: DC | PRN
  Administered 2024-02-08: 4 mL via TOPICAL

## 2024-02-08 MED ORDER — ORAL CARE MOUTH RINSE: 15.0000 mL | OROMUCOSAL | Status: DC | PRN

## 2024-02-08 MED ORDER — AMIODARONE HCL 200 MG PO TABS: 400.0000 mg | ORAL_TABLET | Freq: Two times a day (BID) | ORAL | Status: DC

## 2024-02-08 MED ORDER — PROTAMINE SULFATE 10 MG/ML IV SOLN
INTRAVENOUS | Status: DC | PRN
  Administered 2024-02-08: 240 mg via INTRAVENOUS

## 2024-02-08 MED ORDER — ASPIRIN 81 MG PO CHEW: 324.0000 mg | CHEWABLE_TABLET | Freq: Every day | ORAL | Status: DC

## 2024-02-08 MED ORDER — ASPIRIN 325 MG PO TBEC
325.0000 mg | DELAYED_RELEASE_TABLET | Freq: Every day | ORAL | Status: DC
Start: 2024-02-09 — End: 2024-02-13
  Administered 2024-02-09 – 2024-02-12 (×4): 325 mg via ORAL
  Filled 2024-02-08 (×4): qty 1

## 2024-02-08 MED ORDER — ONDANSETRON HCL 4 MG/2ML IJ SOLN
INTRAMUSCULAR | Status: AC
  Filled 2024-02-08: qty 2

## 2024-02-08 MED ORDER — METOPROLOL TARTRATE 12.5 MG HALF TABLET: 12.5000 mg | ORAL_TABLET | Freq: Once | ORAL | Status: DC

## 2024-02-08 MED ORDER — TRAMADOL HCL 50 MG PO TABS
50.0000 mg | ORAL_TABLET | Freq: Four times a day (QID) | ORAL | Status: DC
  Administered 2024-02-09 – 2024-02-18 (×35): 50 mg via ORAL
  Filled 2024-02-08 (×39): qty 1

## 2024-02-08 MED ORDER — METOPROLOL TARTRATE 12.5 MG HALF TABLET
12.5000 mg | ORAL_TABLET | Freq: Two times a day (BID) | ORAL | Status: DC
  Filled 2024-02-08 (×2): qty 1

## 2024-02-08 MED ORDER — SODIUM BICARBONATE 8.4 % IV SOLN
100.0000 meq | Freq: Once | INTRAVENOUS | Status: AC
Start: 2024-02-08 — End: 2024-02-08
  Administered 2024-02-08: 100 meq via INTRAVENOUS

## 2024-02-08 MED ORDER — MAGNESIUM SULFATE 4 GM/100ML IV SOLN
4.0000 g | Freq: Once | INTRAVENOUS | Status: AC
  Administered 2024-02-08: 4 g via INTRAVENOUS
  Filled 2024-02-08: qty 100

## 2024-02-08 MED ORDER — FENTANYL CITRATE (PF) 250 MCG/5ML IJ SOLN
INTRAMUSCULAR | Status: DC | PRN
  Administered 2024-02-08: 50 ug via INTRAVENOUS
  Administered 2024-02-08: 150 ug via INTRAVENOUS
  Administered 2024-02-08: 250 ug via INTRAVENOUS
  Administered 2024-02-08: 100 ug via INTRAVENOUS
  Administered 2024-02-08: 50 ug via INTRAVENOUS
  Administered 2024-02-08: 100 ug via INTRAVENOUS

## 2024-02-08 MED ORDER — ROCURONIUM BROMIDE 10 MG/ML (PF) SYRINGE
PREFILLED_SYRINGE | INTRAVENOUS | Status: AC
  Filled 2024-02-08: qty 10

## 2024-02-08 MED ORDER — ACETAMINOPHEN 160 MG/5ML PO SOLN: 1000.0000 mg | Freq: Four times a day (QID) | ORAL | Status: DC

## 2024-02-08 MED ORDER — SODIUM CHLORIDE 0.9% FLUSH: 3.0000 mL | INTRAVENOUS | Status: DC | PRN

## 2024-02-08 MED ORDER — CHLORHEXIDINE GLUCONATE 4 % EX SOLN: 30.0000 mL | CUTANEOUS | Status: DC

## 2024-02-08 MED ORDER — DEXAMETHASONE SODIUM PHOSPHATE 10 MG/ML IJ SOLN
INTRAMUSCULAR | Status: AC
  Filled 2024-02-08: qty 1

## 2024-02-08 MED ORDER — SODIUM CHLORIDE 0.9% FLUSH
3.0000 mL | Freq: Two times a day (BID) | INTRAVENOUS | Status: DC
  Administered 2024-02-08: 10 mL via INTRAVENOUS

## 2024-02-08 MED ORDER — INSULIN REGULAR(HUMAN) IN NACL 100-0.9 UT/100ML-% IV SOLN
INTRAVENOUS | Status: DC
  Administered 2024-02-08: 0.9 [IU]/h via INTRAVENOUS

## 2024-02-08 MED ORDER — 0.9 % SODIUM CHLORIDE (POUR BTL) OPTIME
TOPICAL | Status: DC | PRN
  Administered 2024-02-08: 4000 mL

## 2024-02-08 MED ORDER — ~~LOC~~ CARDIAC SURGERY, PATIENT & FAMILY EDUCATION
Freq: Once | Status: DC
  Filled 2024-02-08: qty 1

## 2024-02-08 MED ORDER — PHENYLEPHRINE 80 MCG/ML (10ML) SYRINGE FOR IV PUSH (FOR BLOOD PRESSURE SUPPORT)
PREFILLED_SYRINGE | INTRAVENOUS | Status: AC
  Filled 2024-02-08: qty 10

## 2024-02-08 MED ORDER — PHENYLEPHRINE HCL-NACL 20-0.9 MG/250ML-% IV SOLN
0.0000 ug/min | INTRAVENOUS | Status: DC
  Administered 2024-02-08: 45 ug/min via INTRAVENOUS
  Administered 2024-02-08: 35 ug/min via INTRAVENOUS
  Administered 2024-02-09: 225 ug/min via INTRAVENOUS
  Administered 2024-02-09: 165 ug/min via INTRAVENOUS
  Administered 2024-02-09: 245 ug/min via INTRAVENOUS
  Administered 2024-02-09: 215 ug/min via INTRAVENOUS
  Administered 2024-02-09: 225 ug/min via INTRAVENOUS
  Administered 2024-02-09: 65 ug/min via INTRAVENOUS
  Administered 2024-02-09: 195 ug/min via INTRAVENOUS
  Filled 2024-02-08 (×6): qty 250
  Filled 2024-02-08: qty 500

## 2024-02-08 MED ORDER — BISACODYL 10 MG RE SUPP: 10.0000 mg | Freq: Every day | RECTAL | Status: DC

## 2024-02-08 MED ORDER — SODIUM CHLORIDE 0.9% FLUSH
3.0000 mL | Freq: Two times a day (BID) | INTRAVENOUS | Status: DC
  Administered 2024-02-09 – 2024-02-17 (×18): 3 mL via INTRAVENOUS

## 2024-02-08 MED ORDER — CHLORHEXIDINE GLUCONATE CLOTH 2 % EX PADS
6.0000 | MEDICATED_PAD | Freq: Every day | CUTANEOUS | Status: DC
  Administered 2024-02-08 – 2024-03-10 (×32): 6 via TOPICAL

## 2024-02-08 MED ORDER — PANTOPRAZOLE SODIUM 40 MG PO TBEC
40.0000 mg | DELAYED_RELEASE_TABLET | Freq: Every day | ORAL | Status: DC
  Administered 2024-02-10 – 2024-03-10 (×30): 40 mg via ORAL
  Filled 2024-02-08 (×30): qty 1

## 2024-02-08 MED ORDER — ACETAMINOPHEN 160 MG/5ML PO SOLN: 650.0000 mg | Freq: Once | ORAL | Status: DC

## 2024-02-08 MED ORDER — DEXTROSE 50 % IV SOLN: 0.0000 mL | INTRAVENOUS | Status: DC | PRN

## 2024-02-08 MED ORDER — ORAL CARE MOUTH RINSE
15.0000 mL | OROMUCOSAL | Status: DC
  Administered 2024-02-08 (×2): 15 mL via OROMUCOSAL

## 2024-02-08 MED ORDER — PROPOFOL 10 MG/ML IV BOLUS
INTRAVENOUS | Status: DC | PRN
Start: 2024-02-08 — End: 2024-02-08
  Administered 2024-02-08: 80 mg via INTRAVENOUS
  Administered 2024-02-08: 30 mg via INTRAVENOUS

## 2024-02-08 MED ORDER — METOPROLOL TARTRATE 5 MG/5ML IV SOLN
2.5000 mg | INTRAVENOUS | Status: DC | PRN
  Administered 2024-02-17: 2.5 mg via INTRAVENOUS
  Filled 2024-02-08: qty 5

## 2024-02-08 MED ORDER — SODIUM CHLORIDE 0.45 % IV SOLN: INTRAVENOUS | Status: DC

## 2024-02-08 MED ORDER — POTASSIUM CHLORIDE 10 MEQ/50ML IV SOLN: 10.0000 meq | INTRAVENOUS | Status: AC

## 2024-02-08 MED ORDER — LACTATED RINGERS IV SOLN: INTRAVENOUS | Status: DC | PRN

## 2024-02-08 MED ORDER — CEFAZOLIN SODIUM-DEXTROSE 2-4 GM/100ML-% IV SOLN
2.0000 g | Freq: Three times a day (TID) | INTRAVENOUS | Status: AC
  Administered 2024-02-08 – 2024-02-10 (×6): 2 g via INTRAVENOUS
  Filled 2024-02-08 (×6): qty 100

## 2024-02-08 MED ORDER — CHLORHEXIDINE GLUCONATE 0.12 % MT SOLN
15.0000 mL | Freq: Once | OROMUCOSAL | Status: AC
  Administered 2024-02-08: 15 mL via OROMUCOSAL
  Filled 2024-02-08: qty 15

## 2024-02-08 MED ORDER — PANTOPRAZOLE SODIUM 40 MG IV SOLR
40.0000 mg | Freq: Every day | INTRAVENOUS | Status: AC
  Administered 2024-02-08 – 2024-02-09 (×2): 40 mg via INTRAVENOUS
  Filled 2024-02-08 (×2): qty 10

## 2024-02-08 MED ORDER — MORPHINE SULFATE (PF) 2 MG/ML IV SOLN
1.0000 mg | INTRAVENOUS | Status: DC | PRN
  Administered 2024-02-08 (×2): 2 mg via INTRAVENOUS
  Administered 2024-02-08: 1 mg via INTRAVENOUS
  Administered 2024-02-09 (×3): 4 mg via INTRAVENOUS
  Administered 2024-02-09 (×2): 2 mg via INTRAVENOUS
  Administered 2024-02-10: 4 mg via INTRAVENOUS
  Filled 2024-02-08: qty 2
  Filled 2024-02-08: qty 1
  Filled 2024-02-08 (×2): qty 2
  Filled 2024-02-08: qty 1
  Filled 2024-02-08 (×2): qty 2
  Filled 2024-02-08 (×2): qty 1

## 2024-02-08 MED ORDER — METOCLOPRAMIDE HCL 5 MG/ML IJ SOLN
10.0000 mg | Freq: Four times a day (QID) | INTRAMUSCULAR | Status: AC
  Administered 2024-02-08 – 2024-02-09 (×5): 10 mg via INTRAVENOUS
  Filled 2024-02-08 (×6): qty 2

## 2024-02-08 MED ORDER — VANCOMYCIN HCL IN DEXTROSE 1-5 GM/200ML-% IV SOLN
1000.0000 mg | Freq: Once | INTRAVENOUS | Status: AC
  Administered 2024-02-08: 1000 mg via INTRAVENOUS
  Filled 2024-02-08: qty 200

## 2024-02-08 MED ORDER — ONDANSETRON HCL 4 MG/2ML IJ SOLN
4.0000 mg | Freq: Four times a day (QID) | INTRAMUSCULAR | Status: DC | PRN
  Administered 2024-02-09 – 2024-02-16 (×17): 4 mg via INTRAVENOUS
  Filled 2024-02-08 (×17): qty 2

## 2024-02-08 MED ORDER — BEMPEDOIC ACID-EZETIMIBE 180-10 MG PO TABS: 1.0000 | ORAL_TABLET | Freq: Every day | ORAL | Status: DC

## 2024-02-08 MED ORDER — CHLORHEXIDINE GLUCONATE 0.12 % MT SOLN
15.0000 mL | OROMUCOSAL | Status: AC
  Administered 2024-02-08: 15 mL via OROMUCOSAL
  Filled 2024-02-08: qty 15

## 2024-02-08 MED ORDER — ACETAMINOPHEN 500 MG PO TABS
1000.0000 mg | ORAL_TABLET | Freq: Four times a day (QID) | ORAL | Status: AC
  Administered 2024-02-09 – 2024-02-13 (×17): 1000 mg via ORAL
  Filled 2024-02-08 (×18): qty 2

## 2024-02-08 MED ORDER — ROCURONIUM BROMIDE 10 MG/ML (PF) SYRINGE
PREFILLED_SYRINGE | INTRAVENOUS | Status: DC | PRN
  Administered 2024-02-08 (×2): 50 mg via INTRAVENOUS
  Administered 2024-02-08: 40 mg via INTRAVENOUS

## 2024-02-08 MED ORDER — VANCOMYCIN HCL 1000 MG IV SOLR
INTRAVENOUS | Status: DC | PRN
  Administered 2024-02-08: 500 mL

## 2024-02-08 MED ORDER — MIDAZOLAM HCL (PF) 5 MG/ML IJ SOLN
INTRAMUSCULAR | Status: DC | PRN
  Administered 2024-02-08: 4 mg via INTRAVENOUS
  Administered 2024-02-08 (×2): 1 mg via INTRAVENOUS
  Administered 2024-02-08: 2 mg via INTRAVENOUS

## 2024-02-08 MED ORDER — NITROGLYCERIN IN D5W 200-5 MCG/ML-% IV SOLN
0.0000 ug/min | INTRAVENOUS | Status: DC
Start: 2024-02-08 — End: 2024-02-09

## 2024-02-08 MED ORDER — SODIUM CHLORIDE 0.9 % IV SOLN: INTRAVENOUS | Status: DC | PRN

## 2024-02-08 MED ORDER — METOPROLOL TARTRATE 25 MG/10 ML ORAL SUSPENSION: 12.5000 mg | Freq: Two times a day (BID) | ORAL | Status: DC

## 2024-02-08 MED ORDER — ASPIRIN 81 MG PO CHEW
324.0000 mg | CHEWABLE_TABLET | Freq: Once | ORAL | Status: AC
  Administered 2024-02-08: 324 mg via ORAL
  Filled 2024-02-08 (×2): qty 4

## 2024-02-08 MED ORDER — SODIUM CHLORIDE 0.9% FLUSH
3.0000 mL | Freq: Two times a day (BID) | INTRAVENOUS | Status: DC
  Administered 2024-02-08 – 2024-02-09 (×3): 10 mL via INTRAVENOUS
  Administered 2024-02-09 – 2024-02-10 (×2): 3 mL via INTRAVENOUS
  Administered 2024-02-10 – 2024-02-12 (×5): 10 mL via INTRAVENOUS
  Administered 2024-02-13 (×2): 3 mL via INTRAVENOUS
  Administered 2024-02-14 – 2024-02-15 (×2): 10 mL via INTRAVENOUS
  Administered 2024-02-16: 3 mL via INTRAVENOUS
  Administered 2024-02-16: 10 mL via INTRAVENOUS
  Administered 2024-02-17: 3 mL via INTRAVENOUS
  Administered 2024-02-18 – 2024-02-20 (×5): 10 mL via INTRAVENOUS
  Administered 2024-02-21 – 2024-02-22 (×3): 3 mL via INTRAVENOUS
  Administered 2024-02-22 – 2024-02-23 (×2): 10 mL via INTRAVENOUS
  Administered 2024-02-23: 3 mL via INTRAVENOUS
  Administered 2024-02-24 (×2): 10 mL via INTRAVENOUS
  Administered 2024-02-24: 3 mL via INTRAVENOUS
  Administered 2024-02-25: 10 mL via INTRAVENOUS
  Administered 2024-02-25: 3 mL via INTRAVENOUS
  Administered 2024-02-26 – 2024-02-27 (×3): 10 mL via INTRAVENOUS
  Administered 2024-02-27: 3 mL via INTRAVENOUS
  Administered 2024-02-28 – 2024-03-07 (×18): 10 mL via INTRAVENOUS
  Administered 2024-03-08: 3 mL via INTRAVENOUS
  Administered 2024-03-08 – 2024-03-10 (×3): 10 mL via INTRAVENOUS

## 2024-02-08 MED ORDER — LACTATED RINGERS IV SOLN: INTRAVENOUS | Status: AC

## 2024-02-08 MED ORDER — SODIUM CHLORIDE 0.9 % IV SOLN
250.0000 mL | INTRAVENOUS | Status: AC
  Administered 2024-02-09: 250 mL via INTRAVENOUS

## 2024-02-08 MED ORDER — OXYCODONE HCL 5 MG PO TABS
5.0000 mg | ORAL_TABLET | ORAL | Status: DC | PRN
  Administered 2024-02-08 – 2024-03-09 (×22): 5 mg via ORAL
  Filled 2024-02-08 (×22): qty 1

## 2024-02-08 MED ORDER — ALBUMIN HUMAN 5 % IV SOLN
250.0000 mL | INTRAVENOUS | Status: AC | PRN
  Administered 2024-02-08 – 2024-02-09 (×5): 12.5 g via INTRAVENOUS
  Filled 2024-02-08 (×4): qty 250

## 2024-02-08 MED ORDER — PROPOFOL 10 MG/ML IV BOLUS
INTRAVENOUS | Status: AC
  Filled 2024-02-08: qty 20

## 2024-02-08 MED ORDER — HEPARIN SODIUM (PORCINE) 1000 UNIT/ML IJ SOLN
INTRAMUSCULAR | Status: DC | PRN
  Administered 2024-02-08: 24000 [IU] via INTRAVENOUS

## 2024-02-08 MED ORDER — PHENYLEPHRINE 80 MCG/ML (10ML) SYRINGE FOR IV PUSH (FOR BLOOD PRESSURE SUPPORT)
PREFILLED_SYRINGE | INTRAVENOUS | Status: DC | PRN
Start: 2024-02-08 — End: 2024-02-08
  Administered 2024-02-08 (×3): 80 ug via INTRAVENOUS

## 2024-02-08 MED ORDER — DEXMEDETOMIDINE HCL IN NACL 400 MCG/100ML IV SOLN
0.0000 ug/kg/h | INTRAVENOUS | Status: DC
  Administered 2024-02-08: 0.7 ug/kg/h via INTRAVENOUS
  Filled 2024-02-08: qty 100

## 2024-02-08 MED ORDER — MIDAZOLAM HCL 2 MG/2ML IJ SOLN
2.0000 mg | INTRAMUSCULAR | Status: DC | PRN
  Filled 2024-02-08: qty 2

## 2024-02-08 MED ORDER — LACTATED RINGERS IV SOLN
INTRAVENOUS | Status: DC | PRN
Start: 2024-02-08 — End: 2024-02-08

## 2024-02-08 SURGICAL SUPPLY — 74 items
ADAPTER CARDIO PERF ANTE/RETRO (ADAPTER) IMPLANT
ANTIFOG SOL W/FOAM PAD STRL (MISCELLANEOUS) ×2 IMPLANT
BAG DECANTER FOR FLEXI CONT (MISCELLANEOUS) ×2 IMPLANT
BLADE CLIPPER SURG (BLADE) ×2 IMPLANT
BLADE STERNUM SYSTEM 6 (BLADE) ×2 IMPLANT
CANISTER SUCT 3000ML PPV (MISCELLANEOUS) ×2 IMPLANT
CANN PRFSN 3/8XCNCT ST RT ANG (MISCELLANEOUS) ×2 IMPLANT
CANNULA NON VENT 20FR 12 (CANNULA) ×2 IMPLANT
CANNULA NON VENT 22FR 12 (CANNULA) ×2 IMPLANT
CANNULA PRFSN 3/8XCNCT RT ANG (MISCELLANEOUS) IMPLANT
CANNULA SUMP PERICARDIAL (CANNULA) ×2 IMPLANT
CANNULA VRC MALB SNGL STG 36FR (MISCELLANEOUS) IMPLANT
CATH HEART VENT LEFT (CATHETERS) ×2 IMPLANT
CATH RETROPLEGIA CORONARY 14FR (CATHETERS) IMPLANT
CATH ROBINSON RED A/P 18FR (CATHETERS) ×6 IMPLANT
CATH THOR STR 32F SOFT 20 RADI (CATHETERS) ×2 IMPLANT
CATH THORACIC 28FR RT ANG (CATHETERS) ×2 IMPLANT
CIRCUIT VACPAC SAFETY (MISCELLANEOUS) IMPLANT
CLAMP ISOLATOR SYNERGY LG (MISCELLANEOUS) IMPLANT
CLAMP SUTURE YELLOW 5 PAIRS (MISCELLANEOUS) ×2 IMPLANT
CONNECTOR 1/2X3/8X1/2 3WAY (MISCELLANEOUS) IMPLANT
DEV SUMP PERICARDIAL 20F 15IN (MISCELLANEOUS) IMPLANT
DEVICE SUT CK QUICK LOAD MINI (Prosthesis & Implant Heart) IMPLANT
DRAPE CV SPLIT W-CLR ANES SCRN (DRAPES) ×2 IMPLANT
DRAPE INCISE IOBAN 66X45 STRL (DRAPES) ×2 IMPLANT
DRAPE PERI GROIN 82X75IN TIB (DRAPES) ×2 IMPLANT
DRSG AQUACEL AG ADV 3.5X10 (GAUZE/BANDAGES/DRESSINGS) ×2 IMPLANT
ELECT CAUTERY BLADE 6.4 (BLADE) ×2 IMPLANT
ELECT LEAD UNIPOLAR MYOCARDIAL (ELECTRODE) IMPLANT
ELECT REM PT RETURN 9FT ADLT (ELECTROSURGICAL) ×4 IMPLANT
ELECTRODE REM PT RTRN 9FT ADLT (ELECTROSURGICAL) ×4 IMPLANT
FELT TEFLON 1X6 (MISCELLANEOUS) ×2 IMPLANT
GAUZE SPONGE 4X4 12PLY STRL (GAUZE/BANDAGES/DRESSINGS) ×2 IMPLANT
GOWN STRL REUS W/ TWL LRG LVL3 (GOWN DISPOSABLE) ×12 IMPLANT
INSERT FOGARTY XLG (MISCELLANEOUS) ×2 IMPLANT
IRRIG SUCT STRYKERFLOW 2 WTIP (MISCELLANEOUS) ×2 IMPLANT
IRRIGATION SUCT STRKRFLW 2 WTP (MISCELLANEOUS) IMPLANT
KIT BASIN OR (CUSTOM PROCEDURE TRAY) ×2 IMPLANT
KIT SUT CK MINI COMBO 4X17 (Prosthesis & Implant Heart) IMPLANT
KIT TURNOVER KIT B (KITS) ×2 IMPLANT
LINE VENT (MISCELLANEOUS) IMPLANT
NS IRRIG 1000ML POUR BTL (IV SOLUTION) ×12 IMPLANT
ORGANIZER SUTURE GABBAY-FRATER (MISCELLANEOUS) ×2 IMPLANT
PACK E OPEN HEART (SUTURE) ×2 IMPLANT
PACK OPEN HEART (CUSTOM PROCEDURE TRAY) ×2 IMPLANT
PAD ARMBOARD POSITIONER FOAM (MISCELLANEOUS) ×4 IMPLANT
PAD ELECT DEFIB RADIOL ZOLL (MISCELLANEOUS) ×2 IMPLANT
PENCIL BUTTON HOLSTER BLD 10FT (ELECTRODE) ×2 IMPLANT
POSITIONER HEAD DONUT 9IN (MISCELLANEOUS) ×2 IMPLANT
RING ANNULOPLASTY SIMULUS 30 (Prosthesis & Implant Heart) IMPLANT
SET MPS 3-ND DEL (MISCELLANEOUS) IMPLANT
SOLUTION ANTFG W/FOAM PAD STRL (MISCELLANEOUS) ×2 IMPLANT
SUT ETHIBOND 3 0 SH 1 (SUTURE) IMPLANT
SUT GORETEX CV-5 PH-17 (SUTURE) IMPLANT
SUT MNCRL AB 4-0 PS2 18 (SUTURE) ×4 IMPLANT
SUT PROLENE 4 0 SH DA (SUTURE) ×6 IMPLANT
SUT PROLENE 4-0 RB1 .5 CRCL 36 (SUTURE) ×4 IMPLANT
SUT PROLENE 5 0 RB 2 (SUTURE) IMPLANT
SUT STEEL 6MS V (SUTURE) ×2 IMPLANT
SUT STEEL SZ 6 DBL 3X14 BALL (SUTURE) ×4 IMPLANT
SUT VIC AB 0 CTX36XBRD ANTBCTR (SUTURE) ×4 IMPLANT
SUT VIC AB 2-0 CT1 TAPERPNT 27 (SUTURE) ×4 IMPLANT
SYS CLIP PENDITURE LAAC40 (Clip) ×2 IMPLANT
SYSTEM CLIP PENDITURE LAAC40 (Clip) IMPLANT
SYSTEM SAHARA CHEST DRAIN ATS (WOUND CARE) ×2 IMPLANT
TAG SUTURE CLAMP YLW 5PR (MISCELLANEOUS) ×2 IMPLANT
TOWEL GREEN STERILE (TOWEL DISPOSABLE) ×2 IMPLANT
TOWEL GREEN STERILE FF (TOWEL DISPOSABLE) ×2 IMPLANT
TRAY FOLEY SLVR 16FR TEMP STAT (SET/KITS/TRAYS/PACK) ×2 IMPLANT
TUBE CONNECTING 20X1/4 (TUBING) IMPLANT
UNDERPAD 30X36 HEAVY ABSORB (UNDERPADS AND DIAPERS) ×2 IMPLANT
VENT LEFT HEART 12002 (CATHETERS) ×2 IMPLANT
VRC MALLEABLE SINGLE STG 36FR (MISCELLANEOUS) ×2 IMPLANT
WATER STERILE IRR 1000ML POUR (IV SOLUTION) ×4 IMPLANT

## 2024-02-08 NOTE — Transfer of Care (Signed)
 Immediate Anesthesia Transfer of Care Note  Patient: [name]  Procedure(s) Performed: MITRAL VALVE REPAIR USING A SIMULUS SEMI-RIGID ANNULOPLASTY BAND (Chest) MAZE PROCEDURE CLIPPING, LEFT ATRIAL APPENDAGE USING A MEDTRONIC CLIP (Chest) ECHOCARDIOGRAM, TRANSESOPHAGEAL  Patient Location: SICU  Anesthesia Type:General  Level of Consciousness: sedated and Patient remains intubated per anesthesia plan  Airway & Oxygen Therapy: Patient remains intubated per anesthesia plan and Patient placed on Ventilator (see vital sign flow sheet for setting)  Post-op Assessment: Report given to RN and Post -op Vital signs reviewed and stable  Post vital signs: Reviewed and stable  Last Vitals:  Vitals Value Taken Time  BP 108/52 02/08/24    1303  Temp 35.8 C 02/08/24 1302  Pulse 88 02/08/24   1302  Resp 16 02/08/24 1302  SpO2 100% 02/08/24    1302  Vitals shown include unfiled device data.  Last Pain:  Vitals:   02/08/24 0705  TempSrc:   PainSc: 0-No pain         Complications: No notable events documented.

## 2024-02-08 NOTE — Anesthesia Procedure Notes (Addendum)
 Arterial Line Insertion Start/End4/07/2024 8:00 AM, 02/08/2024 8:10 AM Performed by: Kayleen Memos, CRNA, CRNA  Patient location: Pre-op. Preanesthetic checklist: patient identified, IV checked, site marked, risks and benefits discussed, surgical consent, monitors and equipment checked, pre-op evaluation, timeout performed and anesthesia consent Lidocaine 1% used for infiltration Left, radial was placed Catheter size: 20 G Hand hygiene performed  and maximum sterile barriers used   Attempts: 1 Procedure performed without using ultrasound guided technique. Following insertion, dressing applied and Biopatch. Post procedure assessment: normal and unchanged  Patient tolerated the procedure well with no immediate complications.

## 2024-02-08 NOTE — Discharge Summary (Signed)
 [address]       [place] [address]             [phone]    Physician Discharge Summary  Patient ID: [name] MRN: [id number] DOB/AGE: 1941-07-25 83 y.o.  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   Severe mitral regurgitation 02/08/2024   Mitral regurgitation 12/31/2023   Nonrheumatic mitral valve regurgitation 12/30/2023   Atrial fibrillation with RVR (HCC) 12/30/2023   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   Anemia 02/01/2019   Iron  deficiency anemia 11/08/2018   Cancer (HCC)    Enteritis 09/17/2011   CANCER, ENDOMETRIUM 01/24/2009   Essential hypertension 01/24/2009   DIVERTICULOSIS OF COLON 01/24/2009   RECTAL BLEEDING 01/24/2009   Diarrhea 01/24/2009   HEMORRHOIDS, HX OF 01/24/2009   Discharge Diagnoses:  Patient Active Problem List   Diagnosis Date Noted   S/P MVR (mitral valve repair) 03/09/2024   Status post circumferential ablation of pulmonary vein 03/09/2024   Status post ligation of left atrial appendage 03/09/2024   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   Severe mitral regurgitation 02/08/2024   Mitral regurgitation 12/31/2023   Nonrheumatic mitral valve regurgitation 12/30/2023   Atrial fibrillation with RVR (HCC) 12/30/2023   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   Anemia 02/01/2019   Iron  deficiency anemia 11/08/2018   Cancer (HCC)    Enteritis 09/17/2011   CANCER, ENDOMETRIUM 01/24/2009   Essential hypertension 01/24/2009   DIVERTICULOSIS OF COLON 01/24/2009   RECTAL BLEEDING 01/24/2009   Diarrhea 01/24/2009   HEMORRHOIDS, HX OF 01/24/2009   Discharged Condition: Stable  HPI: This is a very pleasant 83 yo female who has been followed for MR over the past few years which started off as mild and recently been found to have severe MR. She gets SOB with activity and has noted increasing lower  ext edema. She was seen and felt best to be served with surgical intervention and had TEE with confirmation of severe MR from posterior leaflet prolapse/flail and moderate TR with an EF of 60%. She underwent cath with no CAD and mild PHTN. She then developed afib post cath and was admitted and chemically cardioverted and started on eliquis . She denies chest pain or lightheadedness. Dr. [name] discussed the need for MV repair (possible replacement) and TV repair,  Of note, she developed atrial fibrillation after the cardiac catheterization so Apixaban  will need to be stopped 5 days prior to the surgery. Also, she had LE edema at the office appointment and was diuresed with Lasix  20 mg daily and a potassium supplement. Dr. [name] discussed potential risks, benefits, and complications of the surgery and she agreed to proceed.   Hospital Course: She underwent a median sternotomy for mitral valve repair, pulmonary vein isolation MAZE with LA clip. She was transferred from the OR to [facility] ICU in stable condition. She was extubated the afternoon of surgery. She was weaned off Epinephrine  and Neo Synephrine drips. She was in atrial fibrillation the morning of post op day one. She had expected post op blood loss anemia. She did require a transfusion of 1U PRBCs on 04/13. She was transitioned off the Insulin  drip. Her pre op HGA1C was 4.7. Accu checks and SS PRN will be stopped after transfer to the floor. Harlo Ligas was removed on post op day 2. A line, chest tubes and foley were later removed.  She did  have nausea post op, which was felt multifactorial, this improved with scopolamine  patch. She did convert to sinus rhythm but then went back into a fib. She was given numerous IV Amiodarone  boluses and continued on oral Amiodarone . She was restarted on Apixaban  04/14. She required levophed  for hemodynamic support, she was atrial paced to assist with weaning. This was weaned off on 04/14. Chest tubes were removed without  complication on POD3.  Heparin  was started on 04/13 for mitral valve repair. She developed leukocytosis without clear etiology, Zosyn  was started and WBC began trending down. Echo done 04/15 showed LVEF 35-40%, trivial MR, mild MS, and mean MV gradient is 4 mmHg. Advanced heart failure was consulted to optimize medications. She was put on a Lasix  drip. Unna boots were placed to help with LE edema. She developed urinary retention on 04/16 so Scopolamine  patch was discontinued and urinary retention resolved. She had incomplete bladder emptying and with Lasix  drip, a foley was placed on 04/17. She underwent DCCV 04/18. Digoxin  was added.  She had persistent post op hyponatremia and diuresis was stopped. She was deconditioned so PT/OT were consulted. Milrinone  drip was stopped on 04/22. She was in sinus rhythm on Amiodarone  drip and oral Digoxin . BNP was 568.9. Spironolactone  was increased to 25 mg daily and she was given IV Lasix  80 mg.She was stable for transfer from the ICU to 4E for further convalescence on 04/23. She went back into a fib with RVR so she was given another Amiodarone  bolus. Her BNP went up to 753.5 so advanced heart failure adjusted medications accordingly. As of 04/25, she was on Torsemide  40 mg daily and Spironolactone  25 mg daily. She was scheduled for DCCV 04/25 but converted to SR. Her hyponatremia continued to be a difficult issue and ultimately nephrology consultation was obtained to assist with ongoing management.  Nephrology recommendations were followed accordingly. 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 She underwent left thoracentesis again on 04/28 and 1.2 liters of fluid was removed. As of 04/29, she was on Spironolactone  25 mg daily and Farxiga  10 mg daily. She was given Diamox  500 mg daily and Torsemide  40 mg bid on 04/29. Creatinine increased to 1.57 04/30. Diuretics were held and follow up creatinine was 1.5 on 05/02. Per  AHF, she will be continued on Spironolactone  25 mg daily, Farxiga  10 mg daily, and Demadex  20 mg daily.  AHF felt she required more aggressive IV diuresis.  She was started on IV Lasix  80 mg BID and Diamox . Una boots were also replaced.  Due to elevated creatinine level her Eliquis  dose was decreased to 2.5 mg BID.  Once her creatinine normalizes we will plan to increase to 5 mg BID as able.  The patient developed hypokalemia due to aggressive diuretic regimen.  She was supplemented accordingly.  The patient continued to work with PT/OT and they felt patient would benefit from SNF placement.  This was approved by her insurance and she had a bed arranged at [facility].  Her incision is healed.  She is medically stable for discharge today.  Consults: Pulmonary/intensive care, Advanced heart failure, and Nephrology  Significant Diagnostic Studies:    CLINICAL DATA:  Pneumothorax.   EXAM: PORTABLE CHEST 1 VIEW   COMPARISON:  Chest and abdomen 02/08/2024.   FINDINGS: Interval removal of the endotracheal and enteric tubes. Additional support apparatus in similar position. Trace bilateral pleural effusions. No focal consolidation or pneumothorax. Stable cardiomediastinal silhouette. No acute osseous  pathology.   IMPRESSION: 1. Interval removal of the endotracheal and enteric tubes. 2. Trace bilateral pleural effusions. No pneumothorax.     Electronically Signed   By: Angus Bark M.D.   On: 02/09/2024 09:23    Treatments: Surgery:  Complex Mitral Valve repair with placement of a 30 mm Simulus Semirigid Ring and closure of cleft between P1 and P2 and magic stitch to anterior commisure Radiofrequency Pulmonary Vein Isolation Maze with closure of Left atrial appendage with a 40mm Medtronic clip by Dr. [name] on 02/08/2024.  Discharge Exam: Blood pressure (!) 96/50, pulse 74, temperature 97.7 F (36.5 C), temperature source Oral, resp. rate 20, height 5\' 4"  (1.626 m), weight 65.9 kg,  SpO2 99%. {physical [id number]   Discharge Medications:  The patient has been discharged on:   1.Beta Blocker:  Yes [   ]                              No   [ x  ]                              If No, reason:Labile BP  2.Ace Inhibitor/ARB: Yes [   ]                                     No  [x    ]                                     If No, reason:Labile BP  3.Statin:   Yes [ x  ]                  No  [x  ]                  If No, reason:   4.Ecasa:  Yes  [   ]                  No   [x   ]                  If No, reason: On Eliquis   Patient had ACS upon admission:No  Plavix/P2Y12 inhibitor: Yes [   ]                                      No  [  x ]     Discharge Instructions     Amb Referral to Cardiac Rehabilitation   Complete by: As directed    Diagnosis: Valve Repair   Valve: Mitral   After initial evaluation and assessments completed: Virtual Based Care may be provided alone or in conjunction with Phase 2 Cardiac Rehab based on patient barriers.: Yes   Intensive Cardiac Rehabilitation (ICR) [facility] location only OR Traditional Cardiac Rehabilitation (TCR) *If criteria for ICR are not met will enroll in TCR [facility] only): Yes      Allergies as of 03/09/2024       Reactions   Crestor [rosuvastatin] Other (See Comments)   Leg cramps   Glycopyrrolate     Other reaction(s): extreme dry mouth   Codeine Other (See  Comments)   Hyper     Med Rec must be completed prior to using this [facility]***        Durable Medical Equipment  (From admission, onward)           Start     Ordered   03/01/24 1421  For home use only DME Walker rolling  Once       Question Answer Comment  Walker: With 5 Inch Wheels   Patient needs a walker to treat with the following condition Heart failure (HCC)   Patient needs a walker to treat with the following condition Physical deconditioning      03/01/24 1421   02/29/24 0706  For home use only DME 3 n 1  Once        02/29/24 0705    02/29/24 0706  For home use only DME Bedside commode  Once       Question:  Patient needs a bedside commode to treat with the following condition  Answer:  Physical deconditioning   02/29/24 0705            Contact information for follow-up providers     [facility] Follow up.   Specialty: Home Health Services Why: local number [phone] Home Health Physical Therapy-agency will call to arrange follow up appt Contact information: [address] [place] [address] [phone]         [name], MD Follow up.   Specialty: Internal Medicine Why: please call to arrange hospital follow up appt in 7-14 days Contact information: [address] [place] [address] [place] [address] [phone]         [facility] [employer]. Go on 03/22/2024.   Specialty: Cardiothoracic Surgery Why: Appointment time is at 12:30 pm Contact information: [address] [place]  [address] [phone]        Imaging. Go on 03/22/2024.   Why: Please arrive by 11:30 am in order to have a PA/LAT CXR taken PRIOR to office appointment with [facility] Contact information: [address]  [place] [address]        [facility]. Go on 03/21/2024.   Why: Appointment time is at 10:15 am Contact information: [address] [place]  [address]        [facility] Follow up on 03/13/2024.   Specialty: Cardiology Why: Advanced Heart Failure Clinic 10:30 AM  Entrance C, Free Valet parking please bring all meds to appointment Contact information: [address] [place]  [address] [phone]        Dr. Berry [place]. Go on 03/15/2024.   Why: Appointmen time is at 11:00 am Contact information: [address] [place] [address]  [phone]              Contact information for after-discharge care     Destination     [facility] .   Service: Skilled Nursing Contact information: [address] [place]   [address] [phone]                     Signed:  Gates Kasal, PA-C 03/09/2024, 9:03 AM

## 2024-02-08 NOTE — Plan of Care (Signed)
  Problem: Clinical Measurements: Goal: Ability to maintain clinical measurements within normal limits will improve Outcome: Progressing Goal: Will remain free from infection Outcome: Progressing Goal: Diagnostic test results will improve Outcome: Progressing Goal: Respiratory complications will improve Outcome: Progressing   Problem: Nutrition: Goal: Adequate nutrition will be maintained Outcome: Progressing   Problem: Coping: Goal: Level of anxiety will decrease Outcome: Progressing   Problem: Elimination: Goal: Will not experience complications related to bowel motility Outcome: Progressing

## 2024-02-08 NOTE — Anesthesia Procedure Notes (Signed)
 Central Venous Catheter Insertion Performed by: Achille Rich, MD, anesthesiologist Start/End4/07/2024 8:05 AM, 02/08/2024 8:15 AM Patient location: Pre-op. Preanesthetic checklist: patient identified, IV checked, site marked, risks and benefits discussed, surgical consent, monitors and equipment checked, pre-op evaluation, timeout performed and anesthesia consent Lidocaine 1% used for infiltration and patient sedated Hand hygiene performed  and maximum sterile barriers used  Catheter size: 8.5 Fr Sheath introducer Procedure performed using ultrasound guided technique. Ultrasound Notes:anatomy identified, needle tip was noted to be adjacent to the nerve/plexus identified, no ultrasound evidence of intravascular and/or intraneural injection and image(s) printed for medical record Attempts: 1 Following insertion, line sutured and dressing applied. Post procedure assessment: blood return through all ports, free fluid flow and no air  Patient tolerated the procedure well with no immediate complications.

## 2024-02-08 NOTE — Brief Op Note (Addendum)
 02/08/2024  11:32 AM  PATIENT:  [name]  83 y.o. female  PRE-OPERATIVE DIAGNOSIS: 1. Severe MR 2. Moderate TR 3. Atrial fibrillation  POST-OPERATIVE DIAGNOSIS:  1. Severe MR 2. Moderate TR 3. Atrial fibrillation  PROCEDURE: TRANSESOPHAGEAL ECHOCARDIOGRAM,  COMPLEX MITRAL VALVE REPAIR (USING A SIMULUS SEMI-RIGID ANNULOPLASTY BAND Model # 800SC30, Serial # [id number], Size 30 mm), PVI MAZE PROCEDURE, and CLIPPING, LEFT ATRIAL APPENDAGE USING A MEDTRONIC CLIP   SURGEON:  Surgeons and Role:    Eugenio Hoes, MD - Primary  PHYSICIAN ASSISTANT: Doree Fudge PA-C  ASSISTANTS: Alyce Pagan RN  ANESTHESIA:   general  EBL: Per anesthesia, perfusion record  DRAINS:  Chest tubes placed in the mediastinal and pleural spaces    COUNTS CORRECT:  YES  DICTATION: .Dragon Dictation  PLAN OF CARE: Admit to inpatient   PATIENT DISPOSITION:  ICU - intubated and hemodynamically stable.   Delay start of Pharmacological VTE agent (>24hrs) due to surgical blood loss or risk of bleeding: yes  BASELINE WEIGHT: 68.5 kg

## 2024-02-08 NOTE — Anesthesia Preprocedure Evaluation (Signed)
 Anesthesia Evaluation  Patient identified by MRN, date of birth, ID band Patient awake    Reviewed: Allergy & Precautions, H&P , NPO status , Patient's Chart, lab work & pertinent test results  Airway Mallampati: II   Neck ROM: full    Dental   Pulmonary neg pulmonary ROS   breath sounds clear to auscultation       Cardiovascular hypertension, + dysrhythmias + Valvular Problems/Murmurs MR  Rhythm:regular Rate:Normal  TEE (12/30/23): EF 60-65%, severe MR with bileaflet prolapse.  Flail P1 and flail A2.  Severely dilated LA.  Moderate TR.  Trivial AI.   Neuro/Psych    GI/Hepatic ,GERD  ,,  Endo/Other    Renal/GU      Musculoskeletal  (+) Arthritis ,    Abdominal   Peds  Hematology   Anesthesia Other Findings   Reproductive/Obstetrics                             Anesthesia Physical Anesthesia Plan  ASA: 4  Anesthesia Plan: General   Post-op Pain Management:    Induction: Intravenous  PONV Risk Score and Plan: 3 and Ondansetron, Dexamethasone and Treatment may vary due to age or medical condition  Airway Management Planned: Oral ETT  Additional Equipment: Arterial line, CVP, PA Cath, TEE, Ultrasound Guidance Line Placement and 3D TEE  Intra-op Plan:   Post-operative Plan: Post-operative intubation/ventilation  Informed Consent: I have reviewed the patients History and Physical, chart, labs and discussed the procedure including the risks, benefits and alternatives for the proposed anesthesia with the patient or authorized representative who has indicated his/her understanding and acceptance.     Dental advisory given  Plan Discussed with: CRNA, Anesthesiologist and Surgeon  Anesthesia Plan Comments:        Anesthesia Quick Evaluation

## 2024-02-08 NOTE — Op Note (Signed)
 CARDIOVASCULAR SURGERY OPERATIVE NOTE  02/08/2024 [name] [id number]  Surgeon:  Ashley Akin, MD  First Assistant: Randall Hiss [facility]                               An experienced assistant was required given the complexity of this surgery and the standard of surgical care. The assistant was needed for exposure, dissection, suctioning, retraction of delicate tissues and sutures, instrument exchange and for overall help during this procedure.     Preoperative Diagnosis:  Severe Mitral Regurgitation                                             Moderate Tricuspid Regurgitation                                              Atrial Fibrillation  Postoperative Diagnosis:  Same but with only mild TR   Procedure: Complex Mitral Valve repair with placement of a 30 mm Simulus Semirigid Ring and closure of cleft between P1 and P2 and magic stitch to anterior commisure Radiofrequency Pulmonary Vein Isolation Maze with closure of Left atrial appendage with a 40mm Medtronic clip  Anesthesia:  General Endotracheal   Clinical History/Surgical Indication:  83 yo female with NYHA class II symptoms of severe MR, moderate TR with normal LV function and no CAD and no PHTN. Pt developed afib after cath. She has a class I indication for MV repair and will also have TV repair and pulm vein isolation maze with occlusion of LAA.   Findings: There was normal ventricular function.  There appeared to be bileaflet prolapse with greater posterior and anterior leaflet prolapse of the TEE preoperatively.  There was significant mitral annular calcification more prominent at the anterior commissure.  At the conclusion of the repair there was no mitral vegetation with a mean gradient of 2 mmHg.  There was mild to moderate tricuspid regurgitation pre and postoperatively with a annulus size of 33 mm and was felt not to require repair.  Conclusion the procedure there was normal ventricular function the patient was in a  sinus mechanism.  Preparation:  The patient was seen in the preoperative holding area and the correct patient, correct operation were confirmed with the patient after reviewing the medical record and catheterization. The consent was signed by me. Preoperative antibiotics were given. A pulmonary arterial line and radial arterial line were placed by the anesthesia team. The patient was taken back to the operating room and positioned supine on the operating room table. After being placed under general endotracheal anesthesia by the anesthesia team a foley catheter was placed. The neck, chest, abdomen, and both legs were prepped with betadine soap and solution and draped in the usual sterile manner. A surgical time-out was taken and the correct patient and operative procedure were confirmed with the nursing and anesthesia staff.  Operation: Median sternotomy incision was then created and the sternum was divided with a sternal saw.  Heparin was delivered and the pericardial well developed.  The ascending aorta was cannulated with a 20 [place] Starns aortic cannula and a single straight venous cannula was placed in the right atrial appendage and directed towards the  inferior vena cava.  An additional venous cannula right ankle was placed in the superior vena cava.  With adequate confirmation of anticoagulation cardiopulmonary bypass was instituted. The right pulmonary veins were then isolated with the after care clamp and 3 sets of 2 burns were created. An antegrade cardioplegia catheter was placed in the ascending aorta and aortic cross-clamp placed.  Cold [place] blood cardioplegia was delivered for appropriate volume and reanimation dose delivered just prior to cross-clamp removal. The left pulmonary veins were then also isolated with the AtriCure radiofrequency clamp for 3 sets of 2 burns.  Left atrial appendage was occluded with a 40 mm Medtronic clip. Left atrium was then opened in the intra-atrial  groove and excellent exposure of the mitral valve was obtained.  Again there was significant mitral annular calcification throughout the posterior annulus 3-0 Ethibond sutures were then placed in the posterior annulus into the calcium.  2 needle cords were then placed in the papillary muscle heads in anticipation of utilizing these in the repair. The annulus was sized to a 30 mm stimulus semirigid band and this was secured with the cor knot system. Upon saline load testing it appeared that the coaptation of the leaflets appeared sufficient and the large cleft between P1 and P2 was closed with a running 5-0 Prolene suture.  A Magick suture was also placed in the anterior commissure.  There was no need for the needle cords which were removed.  Again on saline load testing there was excellent coaptation of the leaflets. The left atrium was then closed over a ventricular sump with running 4-0 Prolene sutures.  The patient in headdown position aortic cross-clamp was removed and multiple de-airing maneuvers were performed following placement of ventricular and atrial pacing wires.  With adequate de-airing the ventricular sump was removed and the patient was weaned from cardiopulmonary bypass on inotropic support.  With excellent valve function the protamine was delivered and the patient was decannulated and sites oversewn necessary. Chest tubes were brought inferior stab was and secured and with adequate hemostasis the sternum was reapproximated with interrupted stainless steel wires and the presternal subcutaneous tissue and skin were closed in multiple layers absorbable suture.  Sterile dressings were applied.

## 2024-02-08 NOTE — Plan of Care (Signed)

## 2024-02-08 NOTE — Hospital Course (Addendum)
 HPI: This is a very pleasant 83 yo female who has been followed for MR over the past few years which started off as mild and recently been found to have severe MR. She gets SOB with activity and has noted increasing lower ext edema. She was seen and felt best to be served with surgical intervention and had TEE with confirmation of severe MR from posterior leaflet prolapse/flail and moderate TR with an EF of 60%. She underwent cath with no CAD and mild PHTN. She then developed afib post cath and was admitted and chemically cardioverted and started on eliquis . She denies chest pain or lightheadedness. Dr. [name] discussed the need for MV repair (possible replacement) and TV repair,  Of note, she developed atrial fibrillation after the cardiac catheterization so Apixaban  will need to be stopped 5 days prior to the surgery. Also, she had LE edema at the office appointment and was diuresed with Lasix  20 mg daily and a potassium supplement. Dr. [name] discussed potential risks, benefits, and complications of the surgery and she agreed to proceed.   Hospital Course: She underwent a median sternotomy for mitral valve repair, pulmonary vein isolation MAZE with LA clip. She was transferred from the OR to [facility] ICU in stable condition. She was extubated the afternoon of surgery. She was weaned off Epinephrine  and Neo Synephrine drips. She was in atrial fibrillation the morning of post op day one. She had expected post op blood loss anemia. She did require a transfusion of 1U PRBCs on 04/13. She was transitioned off the Insulin  drip. Her pre op HGA1C was 4.7. Accu checks and SS PRN will be stopped after transfer to the floor. Harlo Ligas was removed on post op day 2. A line, chest tubes and foley were later removed.  She did have nausea post op, which was felt multifactorial, this improved with scopolamine  patch. She did convert to sinus rhythm but then went back into a fib. She was given numerous IV Amiodarone  boluses and  continued on oral Amiodarone . She was restarted on Apixaban  04/14. She required levophed  for hemodynamic support, she was atrial paced to assist with weaning. This was weaned off on 04/14. Chest tubes were removed without complication on POD3.  Heparin  was started on 04/13 for mitral valve repair. She developed leukocytosis without clear etiology, Zosyn  was started and WBC began trending down. Echo done 04/15 showed LVEF 35-40%, trivial MR, mild MS, and mean MV gradient is 4 mmHg. Advanced heart failure was consulted to optimize medications. She was put on a Lasix  drip. Unna boots were placed to help with LE edema. She developed urinary retention on 04/16 so Scopolamine  patch was discontinued and urinary retention resolved. She had incomplete bladder emptying and with Lasix  drip, a foley was placed on 04/17. She underwent DCCV 04/18. Digoxin  was added.  She had persistent post op hyponatremia and diuresis was stopped. She was deconditioned so PT/OT were consulted. Milrinone  drip was stopped on 04/22. She was in sinus rhythm on Amiodarone  drip and oral Digoxin . BNP was 568.9. Spironolactone  was increased to 25 mg daily and she was given IV Lasix  80 mg.She was stable for transfer from the ICU to 4E for further convalescence on 04/23. She went back into a fib with RVR so she was given another Amiodarone  bolus. Her BNP went up to 753.5 so advanced heart failure adjusted medications accordingly. As of 04/25, she was on Torsemide  40 mg daily and Spironolactone  25 mg daily. She was scheduled for DCCV 04/25 but converted  to SR. Her hyponatremia continued to be a difficult issue and ultimately nephrology consultation was obtained to assist with ongoing management.  Nephrology recommendations were followed accordingly. 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 She underwent left thoracentesis again on 04/28 and 1.2 liters of fluid was removed. As of 04/29, she was on  Spironolactone  25 mg daily and Farxiga  10 mg daily. She was given Diamox  500 mg daily and Torsemide  40 mg bid on 04/29. Creatinine increased to 1.57 04/30. Diuretics were held and follow up creatinine was 1.5 on 05/02. Per AHF, she will be continued on Spironolactone  25 mg daily, Farxiga  10 mg daily, and Demadex  20 mg daily.  AHF felt she required more aggressive IV diuresis.  She was started on IV Lasix  80 mg BID and Diamox . Una boots were also replaced.  Due to elevated creatinine level her Eliquis  dose was decreased to 2.5 mg BID.  Once her creatinine normalizes we will plan to increase to 5 mg BID as able.  The patient developed hypokalemia due to aggressive diuretic regimen.  She was supplemented accordingly.  The patient continued to work with PT/OT and they felt patient would benefit from SNF placement.  This was approved by her insurance and she had a bed arranged at [facility].  Her incision is healed.  She is medically stable for discharge today.

## 2024-02-08 NOTE — Anesthesia Procedure Notes (Addendum)
 Procedure Name: Intubation Date/Time: 02/08/2024 8:55 AM  Performed by: Kayleen Memos, CRNAPre-anesthesia Checklist: Patient identified, Emergency Drugs available, Suction available and Patient being monitored Patient Re-evaluated:Patient Re-evaluated prior to induction Oxygen Delivery Method: Circle System Utilized Preoxygenation: Pre-oxygenation with 100% oxygen Induction Type: IV induction Ventilation: Mask ventilation without difficulty Laryngoscope Size: Mac and 3 Grade View: Grade II Tube type: Oral Tube size: 7.5 mm Number of attempts: 1 Airway Equipment and Method: Stylet and Oral airway Placement Confirmation: ETT inserted through vocal cords under direct vision, positive ETCO2 and breath sounds checked- equal and bilateral Secured at: 23 cm Tube secured with: Tape Dental Injury: Teeth and Oropharynx as per pre-operative assessment

## 2024-02-08 NOTE — Interval H&P Note (Signed)
 History and Physical Interval Note:  02/08/2024 6:57 AM  [name]  has presented today for surgery, with the diagnosis of severe MR, moderate TR, Afib.  The various methods of treatment have been discussed with the patient and family. After consideration of risks, benefits and other options for treatment, the patient has consented to  Procedure(s) with comments: REPAIR, MITRAL VALVE (N/A) - possible replacement TRICUSPID VALVE REPAIR (N/A) COX MAZE PROCEDURE (N/A) CLIPPING, LEFT ATRIAL APPENDAGE (N/A) ECHOCARDIOGRAM, TRANSESOPHAGEAL (N/A) as a surgical intervention.  The patient's history has been reviewed, patient examined, no change in status, stable for surgery.  I have reviewed the patient's chart and labs.  Questions were answered to the patient's satisfaction.     Eugenio Hoes

## 2024-02-08 NOTE — Procedures (Signed)
 Extubation Procedure Note  Patient Details:   Name: [name] DOB: 11-17-1940 MRN: [id number]   Airway Documentation:    Vent end date: 02/08/24 Vent end time: 1628   Evaluation  O2 sats: stable throughout Complications: No apparent complications Patient did tolerate procedure well. Bilateral Breath Sounds: Diminished   Yes  Patient was extubated to a 4L [place] without any complications, dyspnea or stridor noted. Positive cuff leak prior to extubation, NIF: -20, VC: .   Carlynn Spry 02/08/2024, 4:28 PM

## 2024-02-08 NOTE — Progress Notes (Signed)
 PT failed bedside Yale swallow test, will attempt again when pt is more alert

## 2024-02-08 NOTE — Anesthesia Procedure Notes (Signed)
 Central Venous Catheter Insertion Performed by: Achille Rich, MD, anesthesiologist Start/End4/07/2024 8:15 AM, 02/08/2024 8:17 AM Patient location: Pre-op. Preanesthetic checklist: patient identified, IV checked, site marked, risks and benefits discussed, surgical consent, monitors and equipment checked, pre-op evaluation, timeout performed and anesthesia consent Hand hygiene performed  and maximum sterile barriers used  PA cath was placed.Swan type:thermodilution Procedure performed without using ultrasound guided technique. Attempts: 1 Patient tolerated the procedure well with no immediate complications.

## 2024-02-08 NOTE — Consult Note (Signed)
 NAME:  [name], MRN:  [id number], DOB:  19-Feb-1941, LOS: 0 ADMISSION DATE:  02/08/2024, CONSULTATION DATE:/07/2024 REFERRING MD: Eugenio Hoes, CHIEF COMPLAINT:/07/2024  History of Present Illness:  83 year old female with hypertension, paroxysmal A-fib who initially presented with increasing shortness of breath and bilateral leg swelling, noted to have severe mitral valve regurgitation and moderate tricuspid valve regurgitation.  Her ejection fraction was 60%, she underwent evaluation with cardiac cath which showed no coronary artery disease, today she underwent mitral valve repair with maze procedure.  Patient remained intubated and was transferred to ICU postop.  PCCM was consulted for help evaluation medical management  Pertinent  Medical History   Past Medical History:  Diagnosis Date   Acid reflux    Anemia    Arthritis    Atrial fibrillation (HCC)    Cataract    Diverticulosis of colon    Endometrial cancer (HCC)    1A grade endometrial ca   Essential hypertension 01/24/2009   Qualifier: Diagnosis of  By: Nelson-Smith CMA (AAMA), Dottie     Hx of radiation therapy 04/24/08-05/29/08& 8/12/,8/19,&06/27/08   external beam  through 7/09 ,then intracavity in 06/2008   Hypertension    Mitral valve prolapse    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   Tricuspid regurgitation      Significant Hospital Events: Including procedures, antibiotic start and stop dates in addition to other pertinent events     Interim History / Subjective:  As above  Objective   Blood pressure (!) 127/55, pulse (!) 52, temperature 98.3 F (36.8 C), temperature source Oral, resp. rate 18, height 5\' 4"  (1.626 m), weight 68.5 kg, SpO2 93%.    Vent Mode: SIMV;PSV;PRVC FiO2 (%):  [50 %] 50 % Set Rate:  [16 bmp] 16 bmp Vt Set:  [430 mL] 430 mL PEEP:  [5 cmH20] 5 cmH20 Pressure Support:  [10 cmH20] 10 cmH20   Intake/Output Summary (Last 24 hours) at 02/08/2024 1316 Last data filed at 02/08/2024  1301 Gross per 24 hour  Intake 2454 ml  Output 1750 ml  Net 704 ml   Filed Weights   02/08/24 0655  Weight: 68.5 kg    Examination: General: Crtitically ill-appearing female, orally intubated HEENT: Metaline/AT, eyes anicteric.  ETT and OGT in place Neuro: Sedated, not following commands.  Eyes are closed.  Pupils 3 mm bilateral reactive to light Chest: Central sternotomy incision looks clean and dry, coarse breath sounds, no wheezes or rhonchi.  Mediastinal and chest tube in place Heart: Regular rate and rhythm, no murmurs or gallops Abdomen: Soft, nondistended, bowel sounds present Skin: No rash  Labs and images reviewed  Resolved Hospital Problem list     Assessment & Plan:  Severe mitral regurgitation status post complex mitral valve repair Mild tricuspid regurgitation Paroxysmal A-fib status post maze with closure of left atrial appendage Continue aspirin and Zetia Continue amiodarone Chest tube management TCTS Continue to titrate Precedex with RASS goal 0/-1 Continue pain control with tramadol, oxycodone and morphine Closely monitor chest tube output  Acute respiratory insufficiency, postop Continue on protective ventilation VAP prevention bundle in place Rapid weaning protocol ordered is in place  Hypertension Holding antihypertensive for now, as patient is requiring low-dose phenylephrine  Hyperlipidemia Continue Zetia  Expected perioperative blood loss anemia Monitor H/H and PLT counts   Best Practice (right click and "Reselect all SmartList Selections" daily)   Diet/type: NPO DVT prophylaxis: SCD GI prophylaxis: PPI Lines: Central line, Arterial Line, and yes and it is still needed Foley:  Yes, and it is still needed Code Status:  full code Last date of multidisciplinary goals of care discussion [Per primary team]   Labs   CBC: Recent Labs  Lab 02/07/24 1042 02/08/24 0856 02/08/24 1016 02/08/24 1108 02/08/24 1111 02/08/24 1135 02/08/24 1139   WBC 8.2  --   --   --   --   --   --   HGB 12.8   < > 7.1* 7.1* 6.8* 6.8* 7.5*  HCT 39.6   < > 21.0* 21.8* 20.0* 20.0* 22.0*  MCV 93.8  --   --   --   --   --   --   PLT 227  --   --  177  --   --   --    < > = values in this interval not displayed.    Basic Metabolic Panel: Recent Labs  Lab 02/07/24 1042 02/08/24 0856 02/08/24 0935 02/08/24 0948 02/08/24 0952 02/08/24 1016 02/08/24 1111 02/08/24 1135 02/08/24 1139  NA 137 141 139   < > 141 137 137 139 139  K 4.5 4.1 3.9   < > 3.6 4.7 4.8 4.2 4.2  CL 103 105 105  --   --  101 104  --  105  CO2 26  --   --   --   --   --   --   --   --   GLUCOSE 100* 97 117*  --   --  128* 133*  --  120*  BUN 23 20 18   --   --  18 17  --  17  CREATININE 1.15* 1.00 0.90  --   --  0.80 0.90  --  0.90  CALCIUM 9.0  --   --   --   --   --   --   --   --    < > = values in this interval not displayed.   GFR: Estimated Creatinine Clearance: 47.4 mL/min (by C-G formula based on SCr of 0.9 mg/dL). Recent Labs  Lab 02/07/24 1042  WBC 8.2    Liver Function Tests: Recent Labs  Lab 02/07/24 1042  AST 22  ALT 15  ALKPHOS 24*  BILITOT 0.7  PROT 6.1*  ALBUMIN 3.8   No results for input(s): "LIPASE", "AMYLASE" in the last 168 hours. No results for input(s): "AMMONIA" in the last 168 hours.  ABG    Component Value Date/Time   PHART 7.447 02/08/2024 1135   PCO2ART 33.7 02/08/2024 1135   PO2ART 275 (H) 02/08/2024 1135   HCO3 23.3 02/08/2024 1135   TCO2 24 02/08/2024 1139   ACIDBASEDEF 1.0 02/08/2024 1135   O2SAT 100 02/08/2024 1135     Coagulation Profile: Recent Labs  Lab 02/07/24 1042  INR 1.0    Cardiac Enzymes: No results for input(s): "CKTOTAL", "CKMB", "CKMBINDEX", "TROPONINI" in the last 168 hours.  HbA1C: Hgb A1c MFr Bld  Date/Time Value Ref Range Status  02/07/2024 10:42 AM 4.7 (L) 4.8 - 5.6 % Final    Comment:    (NOTE) Pre diabetes:          5.7%-6.4%  Diabetes:              >6.4%  Glycemic control for    <7.0% adults with diabetes     CBG: No results for input(s): "GLUCAP" in the last 168 hours.  Review of Systems:   Unable to obtain as patient is intubated and sedated  Past Medical History:  She,  has a past medical history of Acid reflux, Anemia, Arthritis, Atrial fibrillation (HCC), Cataract, Diverticulosis of colon, Endometrial cancer (HCC), Essential hypertension (01/24/2009), radiation therapy (04/24/08-05/29/08& 8/12/,8/19,&06/27/08), Hypertension, Mitral valve prolapse, Moderate mitral regurgitation (02/01/2019), and Tricuspid regurgitation.   Surgical History:   Past Surgical History:  Procedure Laterality Date   APPENDECTOMY     1967   arthroscopic knee right surgery     ECTOPIC PREGNANCY SURGERY  1967   iron infusion  12/2022   LAPAROSCOPIC HYSTERECTOMY     total   ORTHOPEDIC SURGERY     Orthoscopic Knee Surgery   RIGHT/LEFT HEART CATH AND CORONARY ANGIOGRAPHY N/A 12/30/2023   Procedure: RIGHT/LEFT HEART CATH AND CORONARY ANGIOGRAPHY;  Surgeon: Yates Decamp, MD;  Location: [facility] INVASIVE CV LAB;  Service: Cardiovascular;  Laterality: N/A;   TEAR DUCT PROBING     TOOTH EXTRACTION     bone graft   TRANSESOPHAGEAL ECHOCARDIOGRAM (CATH LAB) N/A 12/30/2023   Procedure: TRANSESOPHAGEAL ECHOCARDIOGRAM;  Surgeon: Parke Poisson, MD;  Location: [facility] INVASIVE CV LAB;  Service: Cardiovascular;  Laterality: N/A;     Social History:   reports that she has never smoked. She has never used smokeless tobacco. She reports current alcohol use. She reports that she does not use drugs.   Family History:  Her family history includes Breast cancer in her mother and sister; Cancer (age of onset: 69) in her brother; Diabetes in her maternal grandfather and maternal grandmother; Diabetes type II in her mother and sister; Leukemia in her father; Pancreatic cancer in her maternal aunt. There is no history of Colon cancer, Stomach cancer, or Esophageal cancer.   Allergies Allergies  Allergen  Reactions   Crestor [Rosuvastatin] Other (See Comments)    Leg cramps   Glycopyrrolate     Other reaction(s): extreme dry mouth   Codeine Other (See Comments)    Hyper     Home Medications  Prior to Admission medications   Medication Sig Start Date End Date Taking? Authorizing Provider  amiodarone (PACERONE) 200 MG tablet Take 1 tablet (200 mg total) by mouth daily. 01/13/24  Yes Azalee Course, PA  apixaban (ELIQUIS) 5 MG TABS tablet Take 1 tablet (5 mg total) by mouth 2 (two) times daily. 01/26/24  Yes Yates Decamp, MD  b complex vitamins tablet Take 1 tablet by mouth daily.     Yes [provider]  Bempedoic Acid-Ezetimibe (NEXLIZET) 180-10 MG TABS Take 1 tablet by mouth daily.   Yes [provider]  CALCIUM PO Take 650 mg by mouth daily.   Yes [provider]  Cholecalciferol (VITAMIN D3) 125 MCG (5000 UT) CAPS Take 5,000 Units by mouth daily.   Yes [provider]  denosumab (PROLIA) 60 MG/ML SOSY injection Inject 60 mg into the skin every 6 (six) months.   Yes [provider]  furosemide (LASIX) 20 MG tablet Take 1 tablet (20 mg total) by mouth daily. 01/12/24 01/11/25 Yes Eugenio Hoes, MD  Garlic 1000 MG CAPS Take 1,000 mg by mouth daily.   Yes [provider]  [place] Lanius Omega-3 300 MG CAPS Take 300 mg by mouth daily.   Yes [provider]  loperamide (IMODIUM) 2 MG capsule Take 1 capsule (2 mg total) by mouth 4 (four) times daily as needed for diarrhea or loose stools. 01/10/21  Yes Linwood Dibbles, MD  metoprolol succinate (TOPROL-XL) 25 MG 24 hr tablet Take 25 mg by mouth 2 (two) times daily.  Take additional tablet for palpitations  Yes [provider]  Multiple Vitamins-Minerals (MULTIVITAMINS THER. W/MINERALS) TABS Take 1 tablet by mouth daily.     Yes [provider]  pantoprazole (PROTONIX) 40 MG tablet Take 40 mg by mouth daily.   Yes [provider]  potassium chloride SA (KLOR-CON M) 20 MEQ tablet  Take 0.5 tablets (10 mEq total) by mouth daily. 01/12/24  Yes Eugenio Hoes, MD  Zinc 50 MG TABS Take 50 mg by mouth daily.   Yes [provider]  apixaban (ELIQUIS) 5 MG TABS tablet Take 1 tablet (5 mg total) by mouth 2 (two) times daily. 01/31/24   Yates Decamp, MD  atorvastatin (LIPITOR) 10 MG tablet Take 1 tablet (10 mg total) by mouth daily. Patient not taking: Reported on 01/25/2024 12/20/23 03/19/24  Yates Decamp, MD  ezetimibe (ZETIA) 10 MG tablet Take 1 tablet (10 mg total) by mouth daily. Patient not taking: Reported on 01/25/2024 12/20/23 03/19/24  Yates Decamp, MD     Critical care time:      The patient is critically ill due to severe mitral regurgitation status post MVR/acute respiratory insufficiency.  Critical care was necessary to treat or prevent imminent or life-threatening deterioration.  Critical care was time spent personally by me on the following activities: development of treatment plan with patient and/or surrogate as well as nursing, discussions with consultants, evaluation of patient's response to treatment, examination of patient, obtaining history from patient or surrogate, ordering and performing treatments and interventions, ordering and review of laboratory studies, ordering and review of radiographic studies, pulse oximetry, re-evaluation of patient's condition and participation in multidisciplinary rounds.   During this encounter critical care time was devoted to patient care services described in this note for 36 minutes.     Cheri Fowler, MD [facility] See Amion for pager If no response to pager, please call [phone] until 7pm After 7pm, Please call E-link [phone]

## 2024-02-08 NOTE — Discharge Instructions (Addendum)
 Discharge Instructions:  1. You may shower, please wash incisions daily with soap and water and keep dry.  If you wish to cover wounds with dressing you may do so but please keep clean and change daily.  No tub baths or swimming until incisions have completely healed.  If your incisions become red or develop any drainage please call our office at [phone]  2. No Driving until cleared by Dr. Verdene Givens office and you are no longer using narcotic pain medications  3. Monitor your weight daily.. Please use the same scale and weigh at same time... If you gain 5-10 lbs in 48 hours with associated lower extremity swelling, please contact our office at [phone]  4. Fever of 101.5 for at least 24 hours with no source, please contact our office at [phone]  5. Activity- up as tolerated, please walk at least 3 times per day.  Avoid strenuous activity, no lifting, pushing, or pulling with your arms over 8-10 lbs for a minimum of 6 weeks  6. If any questions or concerns arise, please do not hesitate to contact our office at [phone]  Information on my medicine - ELIQUIS  (apixaban )  This medication education was reviewed with me or my healthcare representative as part of my discharge preparation.  The pharmacist that spoke with me during my hospital stay was:  Brunilda Capra, [facility]  Why was Eliquis  prescribed for you? Eliquis  was prescribed for you to reduce the risk of a blood clot forming that can cause a stroke if you have a medical condition called atrial fibrillation (a type of irregular heartbeat).  What do You need to know about Eliquis  ? Take your Eliquis  TWICE DAILY - one tablet in the morning and one tablet in the evening with or without food. If you have difficulty swallowing the tablet whole please discuss with your pharmacist how to take the medication safely.  Take Eliquis  exactly as prescribed by your doctor and DO NOT stop taking Eliquis  without talking to the  doctor who prescribed the medication.  Stopping may increase your risk of developing a stroke.  Refill your prescription before you run out.  After discharge, you should have regular check-up appointments with your healthcare provider that is prescribing your Eliquis .  In the future your dose may need to be changed if your kidney function or weight changes by a significant amount or as you get older.  What do you do if you miss a dose? If you miss a dose, take it as soon as you remember on the same day and resume taking twice daily.  Do not take more than one dose of ELIQUIS  at the same time to make up a missed dose.  Important Safety Information A possible side effect of Eliquis  is bleeding. You should call your healthcare provider right away if you experience any of the following: Bleeding from an injury or your nose that does not stop. Unusual colored urine (red or dark brown) or unusual colored stools (red or black). Unusual bruising for unknown reasons. A serious fall or if you hit your head (even if there is no bleeding).  Some medicines may interact with Eliquis  and might increase your risk of bleeding or clotting while on Eliquis . To help avoid this, consult your healthcare provider or pharmacist prior to using any new prescription or non-prescription medications, including herbals, vitamins, non-steroidal anti-inflammatory drugs (NSAIDs) and supplements.  This website has more information on Eliquis  (apixaban ): http://[email or web address] .com/eliquis Romaine Closs

## 2024-02-09 ENCOUNTER — Encounter (HOSPITAL_COMMUNITY): Payer: Self-pay | Admitting: Thoracic Surgery (Cardiothoracic Vascular Surgery)

## 2024-02-09 ENCOUNTER — Inpatient Hospital Stay (HOSPITAL_COMMUNITY)

## 2024-02-09 ENCOUNTER — Other Ambulatory Visit: Payer: Self-pay | Admitting: Cardiology

## 2024-02-09 DIAGNOSIS — E785 Hyperlipidemia, unspecified: Secondary | ICD-10-CM | POA: Diagnosis not present

## 2024-02-09 DIAGNOSIS — I340 Nonrheumatic mitral (valve) insufficiency: Secondary | ICD-10-CM

## 2024-02-09 DIAGNOSIS — J939 Pneumothorax, unspecified: Secondary | ICD-10-CM | POA: Diagnosis not present

## 2024-02-09 DIAGNOSIS — Z4682 Encounter for fitting and adjustment of non-vascular catheter: Secondary | ICD-10-CM | POA: Diagnosis not present

## 2024-02-09 DIAGNOSIS — R739 Hyperglycemia, unspecified: Secondary | ICD-10-CM

## 2024-02-09 DIAGNOSIS — I480 Paroxysmal atrial fibrillation: Secondary | ICD-10-CM | POA: Diagnosis not present

## 2024-02-09 DIAGNOSIS — I10 Essential (primary) hypertension: Secondary | ICD-10-CM | POA: Diagnosis not present

## 2024-02-09 DIAGNOSIS — J90 Pleural effusion, not elsewhere classified: Secondary | ICD-10-CM | POA: Diagnosis not present

## 2024-02-09 LAB — CBC
HCT: 24.2 % — ABNORMAL LOW (ref 36.0–46.0)
HCT: 25.6 % — ABNORMAL LOW (ref 36.0–46.0)
Hemoglobin: 7.8 g/dL — ABNORMAL LOW (ref 12.0–15.0)
Hemoglobin: 8.2 g/dL — ABNORMAL LOW (ref 12.0–15.0)
MCH: 30.2 pg (ref 26.0–34.0)
MCH: 30.3 pg (ref 26.0–34.0)
MCHC: 32.2 g/dL (ref 30.0–36.0)
MCHC: 32 g/dL (ref 30.0–36.0)
MCV: 93.8 fL (ref 80.0–100.0)
MCV: 94.5 fL (ref 80.0–100.0)
Platelets: 151 10*3/uL (ref 150–400)
Platelets: 142 10*3/uL — ABNORMAL LOW (ref 150–400)
RBC: 2.58 MIL/uL — ABNORMAL LOW (ref 3.87–5.11)
RBC: 2.71 MIL/uL — ABNORMAL LOW (ref 3.87–5.11)
RDW: 20.9 % — ABNORMAL HIGH (ref 11.5–15.5)
RDW: 21.1 % — ABNORMAL HIGH (ref 11.5–15.5)
WBC: 18.9 10*3/uL — ABNORMAL HIGH (ref 4.0–10.5)
WBC: 23.9 10*3/uL — ABNORMAL HIGH (ref 4.0–10.5)
nRBC: 0 % (ref 0.0–0.2)
nRBC: 0 % (ref 0.0–0.2)

## 2024-02-09 LAB — BASIC METABOLIC PANEL WITH GFR
Anion gap: 10 (ref 5–15)
Anion gap: 13 (ref 5–15)
BUN: 15 mg/dL (ref 8–23)
BUN: 18 mg/dL (ref 8–23)
CO2: 21 mmol/L — ABNORMAL LOW (ref 22–32)
CO2: 18 mmol/L — ABNORMAL LOW (ref 22–32)
Calcium: 7.5 mg/dL — ABNORMAL LOW (ref 8.9–10.3)
Calcium: 6.7 mg/dL — ABNORMAL LOW (ref 8.9–10.3)
Chloride: 108 mmol/L (ref 98–111)
Chloride: 105 mmol/L (ref 98–111)
Creatinine, Ser: 0.91 mg/dL (ref 0.44–1.00)
Creatinine, Ser: 1.27 mg/dL — ABNORMAL HIGH (ref 0.44–1.00)
GFR, Estimated: >60 mL/min (ref >60)
GFR, Estimated: 43 mL/min — ABNORMAL LOW (ref >60)
Glucose, Bld: 142 mg/dL — ABNORMAL HIGH (ref 70–99)
Glucose, Bld: 224 mg/dL — ABNORMAL HIGH (ref 70–99)
Potassium: 3.9 mmol/L (ref 3.5–5.1)
Potassium: 3.6 mmol/L (ref 3.5–5.1)
Sodium: 139 mmol/L (ref 135–145)
Sodium: 136 mmol/L (ref 135–145)

## 2024-02-09 LAB — GLUCOSE, CAPILLARY
Glucose-Capillary: 137 mg/dL — ABNORMAL HIGH (ref 70–99)
Glucose-Capillary: 140 mg/dL — ABNORMAL HIGH (ref 70–99)
Glucose-Capillary: 144 mg/dL — ABNORMAL HIGH (ref 70–99)
Glucose-Capillary: 146 mg/dL — ABNORMAL HIGH (ref 70–99)
Glucose-Capillary: 149 mg/dL — ABNORMAL HIGH (ref 70–99)
Glucose-Capillary: 161 mg/dL — ABNORMAL HIGH (ref 70–99)
Glucose-Capillary: 171 mg/dL — ABNORMAL HIGH (ref 70–99)
Glucose-Capillary: 174 mg/dL — ABNORMAL HIGH (ref 70–99)
Glucose-Capillary: 177 mg/dL — ABNORMAL HIGH (ref 70–99)
Glucose-Capillary: 188 mg/dL — ABNORMAL HIGH (ref 70–99)
Glucose-Capillary: 191 mg/dL — ABNORMAL HIGH (ref 70–99)
Glucose-Capillary: 216 mg/dL — ABNORMAL HIGH (ref 70–99)
Glucose-Capillary: 226 mg/dL — ABNORMAL HIGH (ref 70–99)
Glucose-Capillary: 229 mg/dL — ABNORMAL HIGH (ref 70–99)

## 2024-02-09 LAB — MAGNESIUM
Magnesium: 2.6 mg/dL — ABNORMAL HIGH (ref 1.7–2.4)
Magnesium: 2.3 mg/dL (ref 1.7–2.4)

## 2024-02-09 MED ORDER — POTASSIUM CHLORIDE CRYS ER 20 MEQ PO TBCR
20.0000 meq | EXTENDED_RELEASE_TABLET | ORAL | Status: DC
  Administered 2024-02-09 (×2): 20 meq via ORAL
  Filled 2024-02-09 (×3): qty 1

## 2024-02-09 MED ORDER — INSULIN GLARGINE-YFGN 100 UNIT/ML ~~LOC~~ SOLN
5.0000 [IU] | Freq: Two times a day (BID) | SUBCUTANEOUS | Status: AC
  Administered 2024-02-09 (×2): 5 [IU] via SUBCUTANEOUS
  Filled 2024-02-09 (×2): qty 0.05

## 2024-02-09 MED ORDER — AMIODARONE HCL IN DEXTROSE 360-4.14 MG/200ML-% IV SOLN
60.0000 mg/h | INTRAVENOUS | Status: AC
  Administered 2024-02-09 (×2): 60 mg/h via INTRAVENOUS
  Filled 2024-02-09: qty 400

## 2024-02-09 MED ORDER — LACTATED RINGERS IV SOLN: INTRAVENOUS | Status: AC

## 2024-02-09 MED ORDER — AMIODARONE LOAD VIA INFUSION
150.0000 mg | Freq: Once | INTRAVENOUS | Status: AC
  Administered 2024-02-09: 150 mg via INTRAVENOUS
  Filled 2024-02-09: qty 83.34

## 2024-02-09 MED ORDER — FUROSEMIDE 10 MG/ML IJ SOLN
40.0000 mg | Freq: Once | INTRAMUSCULAR | Status: AC
  Administered 2024-02-09: 40 mg via INTRAVENOUS
  Filled 2024-02-09: qty 4

## 2024-02-09 MED ORDER — INSULIN ASPART 100 UNIT/ML IJ SOLN
2.0000 [IU] | INTRAMUSCULAR | Status: DC
  Administered 2024-02-09: 4 [IU] via SUBCUTANEOUS
  Administered 2024-02-09: 6 [IU] via SUBCUTANEOUS
  Administered 2024-02-09: 2 [IU] via SUBCUTANEOUS
  Administered 2024-02-09: 6 [IU] via SUBCUTANEOUS
  Administered 2024-02-10: 4 [IU] via SUBCUTANEOUS
  Administered 2024-02-10 – 2024-02-11 (×4): 2 [IU] via SUBCUTANEOUS
  Administered 2024-02-11 – 2024-02-12 (×2): 4 [IU] via SUBCUTANEOUS
  Administered 2024-02-13 – 2024-02-15 (×5): 2 [IU] via SUBCUTANEOUS
  Administered 2024-02-16 (×3): 4 [IU] via SUBCUTANEOUS
  Administered 2024-02-17: 2 [IU] via SUBCUTANEOUS
  Administered 2024-02-17: 4 [IU] via SUBCUTANEOUS
  Administered 2024-02-17 – 2024-02-19 (×7): 2 [IU] via SUBCUTANEOUS
  Administered 2024-02-19: 4 [IU] via SUBCUTANEOUS
  Administered 2024-02-20: 2 [IU] via SUBCUTANEOUS
  Administered 2024-02-20 (×2): 4 [IU] via SUBCUTANEOUS
  Administered 2024-02-21 (×2): 2 [IU] via SUBCUTANEOUS
  Administered 2024-02-21: 4 [IU] via SUBCUTANEOUS
  Administered 2024-02-22 (×2): 2 [IU] via SUBCUTANEOUS

## 2024-02-09 MED ORDER — NOREPINEPHRINE 16 MG/250ML-% IV SOLN
0.0000 ug/min | INTRAVENOUS | Status: DC
  Administered 2024-02-09: 20 ug/min via INTRAVENOUS
  Administered 2024-02-10: 15 ug/min via INTRAVENOUS
  Administered 2024-02-11: 8 ug/min via INTRAVENOUS
  Filled 2024-02-09 (×3): qty 250

## 2024-02-09 MED ORDER — ENOXAPARIN SODIUM 30 MG/0.3ML IJ SOSY
30.0000 mg | PREFILLED_SYRINGE | INTRAMUSCULAR | Status: DC
  Administered 2024-02-09 – 2024-02-11 (×3): 30 mg via SUBCUTANEOUS
  Filled 2024-02-09 (×3): qty 0.3

## 2024-02-09 MED ORDER — AMIODARONE IV BOLUS ONLY 150 MG/100ML
150.0000 mg | Freq: Once | INTRAVENOUS | Status: AC
  Administered 2024-02-09: 150 mg via INTRAVENOUS

## 2024-02-09 MED ORDER — VASOPRESSIN 20 UNITS/100 ML INFUSION FOR SHOCK
0.0000 [IU]/min | INTRAVENOUS | Status: DC
  Administered 2024-02-09 – 2024-02-12 (×9): 0.04 [IU]/min via INTRAVENOUS
  Filled 2024-02-09 (×9): qty 100

## 2024-02-09 MED ORDER — AMIODARONE HCL IN DEXTROSE 360-4.14 MG/200ML-% IV SOLN
30.0000 mg/h | INTRAVENOUS | Status: DC
  Administered 2024-02-09 – 2024-02-13 (×9): 30 mg/h via INTRAVENOUS
  Filled 2024-02-09 (×9): qty 200

## 2024-02-09 NOTE — Progress Notes (Signed)
 PT Cancellation Note  Patient Details Name: [name] MRN: [id number] DOB: February 12, 1941   Cancelled Treatment:    Reason Eval/Treat Not Completed: Medical issues which prohibited therapy  Spoke with RN and pt with new onset nausea, vomiting, and requiring incr pressor support. Will hold PT eval at this time and attempt 4/11.    Jerolyn Center, PT Acute Rehabilitation Services  Office [phone]   Zena Amos 02/09/2024, 1:25 PM

## 2024-02-09 NOTE — Progress Notes (Signed)
 TCTS DAILY ICU PROGRESS NOTE                   [address]            [place] [address]          [phone]   1 Day Post-Op Procedure(s) (LRB): MITRAL VALVE REPAIR USING A SIMULUS SEMI-RIGID ANNULOPLASTY BAND (N/A) MAZE PROCEDURE (N/A) CLIPPING, LEFT ATRIAL APPENDAGE USING A MEDTRONIC CLIP (N/A) ECHOCARDIOGRAM, TRANSESOPHAGEAL (N/A)  Total Length of Stay:  LOS: 1 day   Subjective: Pain control is reasonably, some nausea  Objective: Vital signs in last 24 hours: Temp:  [96.4 F (35.8 C)-99 F (37.2 C)] 99 F (37.2 C) (04/10 0700) Pulse Rate:  [52-118] 103 (04/10 0700) Cardiac Rhythm: Atrial fibrillation (04/10 0615) Resp:  [9-30] 16 (04/10 0700) BP: (91-116)/(55-77) 113/71 (04/10 0635) SpO2:  [92 %-100 %] 98 % (04/10 0700) Arterial Line BP: (74-120)/(15-72) 100/55 (04/10 0700) FiO2 (%):  [40 %-50 %] 40 % (04/09 1536) Weight:  [73 kg] 73 kg (04/10 0500)  Filed Weights   02/08/24 0655 02/09/24 0500  Weight: 68.5 kg 73 kg    Weight change: 4.507 kg   Hemodynamic parameters for last 24 hours: PAP: (24-44)/(-7-25) 37/17 CVP:  [3 mmHg-29 mmHg] 16 mmHg PCWP:  [14 mmHg-17 mmHg] 14 mmHg CO:  [3 L/min-5.3 L/min] 5.3 L/min CI:  [1.7 L/min/m2-3.02 L/min/m2] 3.02 L/min/m2  Intake/Output from previous day: 04/09 0701 - 04/10 0700 In: 6200.9 [P.O.:596; I.V.:2808.3; Blood:454; IV Piggyback:2342.6] Out: 3520 [Urine:1810; Blood:900; Chest Tube:810]  Intake/Output this shift: No intake/output data recorded.  Current Meds: Scheduled Meds:  acetaminophen  1,000 mg Oral Q6H   acetaminophen  650 mg Oral Once   amiodarone  400 mg Oral BID   aspirin EC  325 mg Oral Daily   bisacodyl  10 mg Oral Daily   Or   bisacodyl  10 mg Rectal Daily   Chlorhexidine Gluconate Cloth  6 each Topical Daily   docusate sodium  200 mg Oral Daily   ezetimibe  10 mg Oral Daily   metoCLOPramide (REGLAN) injection  10 mg Intravenous Q6H   metoprolol tartrate  12.5 mg  Oral BID   [START ON 02/10/2024] pantoprazole  40 mg Oral Daily   pantoprazole (PROTONIX) IV  40 mg Intravenous QHS   sodium chloride flush  3 mL Intravenous Q12H   sodium chloride flush  3-10 mL Intravenous Q12H   traMADol  50 mg Oral Q6H   Continuous Infusions:  sodium chloride 1 mL/hr at 02/09/24 0700    ceFAZolin (ANCEF) IV Stopped (02/09/24 0603)   dexmedetomidine (PRECEDEX) IV infusion Stopped (02/08/24 1752)   epinephrine 4.5 mcg/min (02/09/24 0700)   insulin 1.4 Units/hr (02/09/24 0700)   lactated ringers 120 mL/hr at 02/08/24 1251   nitroGLYCERIN Stopped (02/08/24 1402)   phenylephrine (NEO-SYNEPHRINE) Adult infusion 215 mcg/min (02/09/24 0700)   PRN Meds:.dextrose, metoprolol tartrate, midazolam, morphine injection, ondansetron (ZOFRAN) IV, mouth rinse, oxyCODONE, sodium chloride flush, sodium chloride flush  General appearance: alert, cooperative, and no distress Heart: irregularly irregular rhythm Lungs: clear anterolat Abdomen: soft, non-tender Extremities: min edema Wound: dressing intact  Lab Results: CBC: Recent Labs    02/08/24 1849 02/09/24 0519  WBC 23.0* 18.9*  HGB 8.8*  8.2* 7.8*  HCT 26.7*  24.0* 24.2*  PLT 143* 151   BMET:  Recent Labs    02/08/24 1849 02/09/24 0519  NA 141  142 139  K 4.1  4.1 3.9  CL 106 108  CO2 24 21*  GLUCOSE 139* 142*  BUN 20 15  CREATININE 0.86 0.91  CALCIUM 7.8* 7.5*    CMET: Lab Results  Component Value Date   WBC 18.9 (H) 02/09/2024   HGB 7.8 (L) 02/09/2024   HCT 24.2 (L) 02/09/2024   PLT 151 02/09/2024   GLUCOSE 142 (H) 02/09/2024   CHOL 104 04/29/2021   TRIG 157 (H) 04/29/2021   HDL 38 (L) 04/29/2021   LDLCALC 39 04/29/2021   ALT 15 02/07/2024   AST 22 02/07/2024   NA 139 02/09/2024   K 3.9 02/09/2024   CL 108 02/09/2024   CREATININE 0.91 02/09/2024   BUN 15 02/09/2024   CO2 21 (L) 02/09/2024   TSH 3.482 12/30/2023   INR 1.6 (H) 02/08/2024   HGBA1C 4.7 (L) 02/07/2024      PT/INR:   Recent Labs    02/08/24 1300  LABPROT 19.5*  INR 1.6*   Radiology: [facility] Chest Port 1 View Result Date: 02/08/2024 CLINICAL DATA:  Pneumothorax. EXAM: PORTABLE CHEST 1 VIEW COMPARISON:  February 07, 2024. FINDINGS: Endotracheal and nasogastric tubes are in grossly good position. Right internal jugular Swan-Ganz catheter is noted with tip in expected position of main pulmonary artery. Status post cardiac valve repair. No pneumothorax is noted. Lungs are clear. Bony thorax is unremarkable. IMPRESSION: Postsurgical changes as noted above.  No definite pneumothorax seen. Electronically Signed   By: Lupita Raider M.D.   On: 02/08/2024 15:53     Assessment/Plan: S/P Procedure(s) (LRB): MITRAL VALVE REPAIR USING A SIMULUS SEMI-RIGID ANNULOPLASTY BAND (N/A) MAZE PROCEDURE (N/A) CLIPPING, LEFT ATRIAL APPENDAGE USING A MEDTRONIC CLIP (N/A) ECHOCARDIOGRAM, TRANSESOPHAGEAL (N/A) POD#1  1 Tmax 99, afib, RVR at times, some PVC's, s BP 90's-110's, good CI, PAP/CVP a little elevated, gtts: Epi and neo, wean epi as able, on oral amio- could consider change to IV 2 O2 sats good on 2 liters 3 good UOP, weight up about 5 kg, will need diuresis 4 CT 810 ml- leave in place for now 5 CBG- good control, non- diabetic, usual protocols 6 normal renal fxn, K+ normal, MG++ slightly elevated 7 leukocytosis trend improving- likely reactive 8 expected ABLA- 7.8/24.2 currently, monitor closely , should equilibrate some with diuresis 9 thrombocytopenia- now in normal range 10 CXR - small pleural effus w minor blunting of costal- phrenic angles 11 routine rehab and pulm hygiene  Rowe Clack PA-C 02/09/2024 7:20 AM

## 2024-02-09 NOTE — Progress Notes (Deleted)
 TCTS DAILY ICU PROGRESS NOTE                   [address]            [place] [address]          [phone]   1 Day Post-Op Procedure(s) (LRB): MITRAL VALVE REPAIR USING A SIMULUS SEMI-RIGID ANNULOPLASTY BAND (N/A) MAZE PROCEDURE (N/A) CLIPPING, LEFT ATRIAL APPENDAGE USING A MEDTRONIC CLIP (N/A) ECHOCARDIOGRAM, TRANSESOPHAGEAL (N/A)  Total Length of Stay:  LOS: 1 day   Subjective: Patient had nausea and incisional/sternal pain earlier.  Objective: Vital signs in last 24 hours: Temp:  [96.4 F (35.8 C)-99 F (37.2 C)] 99 F (37.2 C) (04/10 0700) Pulse Rate:  [52-118] 103 (04/10 0700) Cardiac Rhythm: Atrial fibrillation (04/10 0615) Resp:  [9-30] 16 (04/10 0700) BP: (91-116)/(55-77) 113/71 (04/10 0635) SpO2:  [92 %-100 %] 98 % (04/10 0700) Arterial Line BP: (74-120)/(15-72) 100/55 (04/10 0700) FiO2 (%):  [40 %-50 %] 40 % (04/09 1536) Weight:  [73 kg] 73 kg (04/10 0500)  Filed Weights   02/08/24 0655 02/09/24 0500  Weight: 68.5 kg 73 kg    Weight change: 4.507 kg   Hemodynamic parameters for last 24 hours: PAP: (24-44)/(-7-25) 37/17 CVP:  [3 mmHg-29 mmHg] 16 mmHg PCWP:  [14 mmHg-17 mmHg] 14 mmHg CO:  [3 L/min-5.3 L/min] 5.3 L/min CI:  [1.7 L/min/m2-3.02 L/min/m2] 3.02 L/min/m2  Intake/Output from previous day: 04/09 0701 - 04/10 0700 In: 6200.9 [P.O.:596; I.V.:2808.3; Blood:454; IV Piggyback:2342.6] Out: 3520 [Urine:1810; Blood:900; Chest Tube:810]  Intake/Output this shift: No intake/output data recorded.  Current Meds: Scheduled Meds:  acetaminophen  1,000 mg Oral Q6H   acetaminophen  650 mg Oral Once   amiodarone  400 mg Oral BID   aspirin EC  325 mg Oral Daily   bisacodyl  10 mg Oral Daily   Or   bisacodyl  10 mg Rectal Daily   Chlorhexidine Gluconate Cloth  6 each Topical Daily   docusate sodium  200 mg Oral Daily   ezetimibe  10 mg Oral Daily   metoCLOPramide (REGLAN) injection  10 mg Intravenous Q6H   metoprolol  tartrate  12.5 mg Oral BID   [START ON 02/10/2024] pantoprazole  40 mg Oral Daily   pantoprazole (PROTONIX) IV  40 mg Intravenous QHS   sodium chloride flush  3 mL Intravenous Q12H   sodium chloride flush  3-10 mL Intravenous Q12H   traMADol  50 mg Oral Q6H   Continuous Infusions:  sodium chloride 1 mL/hr at 02/09/24 0700    ceFAZolin (ANCEF) IV Stopped (02/09/24 0603)   dexmedetomidine (PRECEDEX) IV infusion Stopped (02/08/24 1752)   epinephrine 4.5 mcg/min (02/09/24 0700)   insulin 1.4 Units/hr (02/09/24 0700)   lactated ringers 120 mL/hr at 02/08/24 1251   nitroGLYCERIN Stopped (02/08/24 1402)   phenylephrine (NEO-SYNEPHRINE) Adult infusion 215 mcg/min (02/09/24 0700)   PRN Meds:.dextrose, metoprolol tartrate, midazolam, morphine injection, ondansetron (ZOFRAN) IV, mouth rinse, oxyCODONE, sodium chloride flush, sodium chloride flush  General appearance: alert, cooperative, and no distress Neurologic: intact Heart: irregularly irregular rhythm Lungs: Slightly diminished bibasilar breath sounds Abdomen: Soft, non tender, sporadic bowel sounds Extremities: SCDs in place Wound: Aquacel intact  Lab Results: CBC: Recent Labs    02/08/24 1849 02/09/24 0519  WBC 23.0* 18.9*  HGB 8.8*  8.2* 7.8*  HCT 26.7*  24.0* 24.2*  PLT 143* 151   BMET:  Recent Labs    02/08/24 1849 02/09/24 0519  NA 141  142 139  K 4.1  4.1 3.9  CL 106 108  CO2 24 21*  GLUCOSE 139* 142*  BUN 20 15  CREATININE 0.86 0.91  CALCIUM 7.8* 7.5*    CMET: Lab Results  Component Value Date   WBC 18.9 (H) 02/09/2024   HGB 7.8 (L) 02/09/2024   HCT 24.2 (L) 02/09/2024   PLT 151 02/09/2024   GLUCOSE 142 (H) 02/09/2024   CHOL 104 04/29/2021   TRIG 157 (H) 04/29/2021   HDL 38 (L) 04/29/2021   LDLCALC 39 04/29/2021   ALT 15 02/07/2024   AST 22 02/07/2024   NA 139 02/09/2024   K 3.9 02/09/2024   CL 108 02/09/2024   CREATININE 0.91 02/09/2024   BUN 15 02/09/2024   CO2 21 (L) 02/09/2024   TSH  3.482 12/30/2023   INR 1.6 (H) 02/08/2024   HGBA1C 4.7 (L) 02/07/2024      PT/INR:  Recent Labs    02/08/24 1300  LABPROT 19.5*  INR 1.6*   Radiology: [facility] Chest Port 1 View Result Date: 02/08/2024 CLINICAL DATA:  Pneumothorax. EXAM: PORTABLE CHEST 1 VIEW COMPARISON:  February 07, 2024. FINDINGS: Endotracheal and nasogastric tubes are in grossly good position. Right internal jugular Swan-Ganz catheter is noted with tip in expected position of main pulmonary artery. Status post cardiac valve repair. No pneumothorax is noted. Lungs are clear. Bony thorax is unremarkable. IMPRESSION: Postsurgical changes as noted above.  No definite pneumothorax seen. Electronically Signed   By: Lupita Raider M.D.   On: 02/08/2024 15:53     Assessment/Plan: S/P Procedure(s) (LRB): MITRAL VALVE REPAIR USING A SIMULUS SEMI-RIGID ANNULOPLASTY BAND (N/A) MAZE PROCEDURE (N/A) CLIPPING, LEFT ATRIAL APPENDAGE USING A MEDTRONIC CLIP (N/A) ECHOCARDIOGRAM, TRANSESOPHAGEAL (N/A) CV-CO/CI 5.3/3.02. Atrial fibrillation. On Epinephrine and Neo Synephrine drips. Wean as able. Oral Amiodarone is scheduled to start today as well as Lopressor 12.5 mg bid. Will discuss with Dr. Leafy Ro about starting Amodarone drip. Will restart Apixaban later in post op course  Pulmonary-on 2 liters of oxygen via [place]. Chest tubes with 810 cc. CXR this am appears stable. Chest tubes to remain. Above pre op weight-will begin diuresis once off pressors Expected post op blood loss anemia-H and H this am 7.8 and 24.2. Monitor need for transfusion CBGs 146/140/137. Transition off Insulin drip. Pre op HGA1C 4.7 6. Lovenox for DVT prophylaxis   Ardelle Balls PA-C 02/09/2024 7:44 AM

## 2024-02-09 NOTE — Progress Notes (Signed)
 Patient ID: [name], female   DOB: 1941/01/23, 83 y.o.   MRN: [id number]  TCTS Evening Rounds:  Remains in atrial fib 95 on IV amio.  Started on vaso .04 and NE today per CCM and epi weaned off. NE now on 17 with BP 120/63. CI 1.92.  UO ok CT 20-30/hr.  Nausea today.  CBC    Component Value Date/Time   WBC 23.9 (H) 02/09/2024 1602   RBC 2.71 (L) 02/09/2024 1602   HGB 8.2 (L) 02/09/2024 1602   HGB 11.1 (L) 12/05/2023 0803   HGB 12.8 05/28/2008 1551   HCT 25.6 (L) 02/09/2024 1602   HCT 36.6 05/28/2008 1551   PLT 142 (L) 02/09/2024 1602   PLT 174 12/05/2023 0803   PLT 164 05/28/2008 1551   MCV 94.5 02/09/2024 1602   MCV 89.3 05/28/2008 1551   MCH 30.3 02/09/2024 1602   MCHC 32.0 02/09/2024 1602   RDW 21.1 (H) 02/09/2024 1602   RDW 13.0 05/28/2008 1551   LYMPHSABS 1.5 12/05/2023 0803   LYMPHSABS 0.4 (L) 05/28/2008 1551   MONOABS 0.7 12/05/2023 0803   MONOABS 0.8 05/28/2008 1551   EOSABS 0.3 12/05/2023 0803   EOSABS 0.3 05/28/2008 1551   BASOSABS 0.1 12/05/2023 0803   BASOSABS 0.0 05/28/2008 1551    BMET    Component Value Date/Time   NA 136 02/09/2024 1602   NA 139 12/16/2023 1638   K 3.6 02/09/2024 1602   CL 105 02/09/2024 1602   CO2 18 (L) 02/09/2024 1602   GLUCOSE 224 (H) 02/09/2024 1602   BUN 18 02/09/2024 1602   BUN 17 12/16/2023 1638   CREATININE 1.27 (H) 02/09/2024 1602   CALCIUM 6.7 (L) 02/09/2024 1602   EGFR 61 12/16/2023 1638   GFRNONAA 43 (L) 02/09/2024 1602   Wean NE as tolerated.

## 2024-02-09 NOTE — Consult Note (Signed)
 NAME:  [name], MRN:  [id number], DOB:  Apr 30, 1941, LOS: 1 ADMISSION DATE:  02/08/2024, CONSULTATION DATE:/07/2024 REFERRING MD: Eugenio Hoes, CHIEF COMPLAINT:/07/2024  History of Present Illness:  83 year old female with hypertension, paroxysmal A-fib who initially presented with increasing shortness of breath and bilateral leg swelling, noted to have severe mitral valve regurgitation and moderate tricuspid valve regurgitation.  Her ejection fraction was 60%, she underwent evaluation with cardiac cath which showed no coronary artery disease, today she underwent mitral valve repair with maze procedure.  Patient remained intubated and was transferred to ICU postop.  PCCM was consulted for help evaluation medical management  Pertinent  Medical History   Past Medical History:  Diagnosis Date   Acid reflux    Anemia    Arthritis    Atrial fibrillation (HCC)    Cataract    Diverticulosis of colon    Endometrial cancer (HCC)    1A grade endometrial ca   Essential hypertension 01/24/2009   Qualifier: Diagnosis of  By: Nelson-Monice Lundy CMA (AAMA), Dottie     Hx of radiation therapy 04/24/08-05/29/08& 8/12/,8/19,&06/27/08   external beam  through 7/09 ,then intracavity in 06/2008   Hypertension    Mitral valve prolapse    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   Tricuspid regurgitation      Significant Hospital Events: Including procedures, antibiotic start and stop dates in addition to other pertinent events   4/9 Mitral valve repair s/p Maze procedure, extubated 4/10 Afib RVR on amio gtt, requiring pressors   Interim History / Subjective:  Alert, awake, oriented x 4   Objective   Blood pressure 119/60, pulse 84, temperature 99 F (37.2 C), resp. rate 18, height 5\' 4"  (1.626 m), weight 73 kg, SpO2 94%. PAP: (24-44)/(-7-25) 32/11 CVP:  [3 mmHg-29 mmHg] 14 mmHg PCWP:  [14 mmHg-17 mmHg] 14 mmHg CO:  [3 L/min-5.3 L/min] 4.3 L/min CI:  [1.7 L/min/m2-3.02 L/min/m2] 2.5 L/min/m2  Vent  Mode: CPAP;PSV FiO2 (%):  [40 %-50 %] 40 % Set Rate:  [4 bmp-16 bmp] 4 bmp Vt Set:  [430 mL] 430 mL PEEP:  [5 cmH20] 5 cmH20 Pressure Support:  [10 cmH20] 10 cmH20 Plateau Pressure:  [14 cmH20] 14 cmH20   Intake/Output Summary (Last 24 hours) at 02/09/2024 0955 Last data filed at 02/09/2024 5621 Gross per 24 hour  Intake 5923.26 ml  Output 3345 ml  Net 2578.26 ml   Filed Weights   02/08/24 0655 02/09/24 0500  Weight: 68.5 kg 73 kg    Examination: General: acutely ill older adult female, no distress HEENT: normocephalic, PERRLA intact, Pink MM, teeth present CV: s1,s2, Afib RVR, no JVD, no MRG, not paced, sternotomy dressing  Pulm: clear throughout, diminished in lower bases, no distress, mediastinal tubes in place Abs: bs soft, distended, bowel sounds present  Skin: No rash Extremities: warm, able to move all extremities  GU: deferred   Labs and images reviewed  Resolved Hospital Problem list   Acute respiratory insufficiency, postop-extubated on 4/9   Assessment & Plan:  Severe mitral regurgitation status post complex mitral valve repair Mild tricuspid regurgitation Paroxysmal A-fib status post maze with closure of left atrial appendage Afib RVR approximately at 0400, became hypotensive during amio bolus/admin  P: Continue amio gtt  Continue chest tube management per TCTs  Continue aspirin/zetia  Continue pain management with tramadol, oxycodone/morphine  Continue to titrate epi/neo, MAP goal >65  Monitor BP via  Continue rocephin for surgical antibiotic prophylaxis   Hypertension P: Continue to antihypertensives for now  since requiring pressors MAP goal > 65   Hyperglycemia  P: On insulin gtt -Plan to transition off gtt Place on SSI Semglee 5 units daily  CBG q 4   Hyperlipidemia P: Continue zetia   Expected perioperative blood loss anemia Hgb 7.8 from 8.8  P:  Continue to monitor h/h Transfuse for hgb <7   Best Practice (right click and  "Reselect all SmartList Selections" daily)   Diet/type: NPO DVT prophylaxis: SCD GI prophylaxis: PPI Lines: Central line, Arterial Line, and yes and it is still needed Foley:  Yes, and it is still needed Code Status:  full code Last date of multidisciplinary goals of care discussion [Per primary team]   CC 35 mins   Christian Kortnee Bas AGACNP-BC   [facility] 02/09/2024, 1:27 PM  Please see [email or web address] for pager details.  From 7A-7P if no response, please call [phone]. After hours, please call ELink [phone].

## 2024-02-10 ENCOUNTER — Inpatient Hospital Stay (HOSPITAL_COMMUNITY)

## 2024-02-10 DIAGNOSIS — I340 Nonrheumatic mitral (valve) insufficiency: Secondary | ICD-10-CM | POA: Diagnosis not present

## 2024-02-10 DIAGNOSIS — R112 Nausea with vomiting, unspecified: Secondary | ICD-10-CM

## 2024-02-10 DIAGNOSIS — E162 Hypoglycemia, unspecified: Secondary | ICD-10-CM | POA: Diagnosis not present

## 2024-02-10 LAB — CBC
HCT: 24.4 % — ABNORMAL LOW (ref 36.0–46.0)
Hemoglobin: 7.9 g/dL — ABNORMAL LOW (ref 12.0–15.0)
MCH: 30.5 pg (ref 26.0–34.0)
MCHC: 32.4 g/dL (ref 30.0–36.0)
MCV: 94.2 fL (ref 80.0–100.0)
Platelets: 121 10*3/uL — ABNORMAL LOW (ref 150–400)
RBC: 2.59 MIL/uL — ABNORMAL LOW (ref 3.87–5.11)
RDW: 21 % — ABNORMAL HIGH (ref 11.5–15.5)
WBC: 27.5 10*3/uL — ABNORMAL HIGH (ref 4.0–10.5)
nRBC: 0 % (ref 0.0–0.2)

## 2024-02-10 LAB — BASIC METABOLIC PANEL WITH GFR
Anion gap: 8 (ref 5–15)
BUN: 21 mg/dL (ref 8–23)
CO2: 19 mmol/L — ABNORMAL LOW (ref 22–32)
Calcium: 6.9 mg/dL — ABNORMAL LOW (ref 8.9–10.3)
Chloride: 106 mmol/L (ref 98–111)
Creatinine, Ser: 1.17 mg/dL — ABNORMAL HIGH (ref 0.44–1.00)
GFR, Estimated: 47 mL/min — ABNORMAL LOW (ref >60)
Glucose, Bld: 152 mg/dL — ABNORMAL HIGH (ref 70–99)
Potassium: 4.7 mmol/L (ref 3.5–5.1)
Sodium: 133 mmol/L — ABNORMAL LOW (ref 135–145)

## 2024-02-10 LAB — GLUCOSE, CAPILLARY
Glucose-Capillary: 132 mg/dL — ABNORMAL HIGH (ref 70–99)
Glucose-Capillary: 188 mg/dL — ABNORMAL HIGH (ref 70–99)
Glucose-Capillary: 148 mg/dL — ABNORMAL HIGH (ref 70–99)
Glucose-Capillary: 117 mg/dL — ABNORMAL HIGH (ref 70–99)
Glucose-Capillary: 141 mg/dL — ABNORMAL HIGH (ref 70–99)
Glucose-Capillary: 141 mg/dL — ABNORMAL HIGH (ref 70–99)

## 2024-02-10 LAB — ECHO INTRAOPERATIVE TEE
Height: 64 in
Weight: 2416 [oz_av]

## 2024-02-10 LAB — MAGNESIUM: Magnesium: 2.4 mg/dL (ref 1.7–2.4)

## 2024-02-10 LAB — PHOSPHORUS: Phosphorus: 2.4 mg/dL — ABNORMAL LOW (ref 2.5–4.6)

## 2024-02-10 MED ORDER — AMIODARONE LOAD VIA INFUSION
150.0000 mg | Freq: Once | INTRAVENOUS | Status: AC
  Administered 2024-02-10: 150 mg via INTRAVENOUS
  Filled 2024-02-10: qty 83.34

## 2024-02-10 MED ORDER — POTASSIUM CHLORIDE 10 MEQ/50ML IV SOLN
10.0000 meq | INTRAVENOUS | Status: AC
  Administered 2024-02-10 (×2): 10 meq via INTRAVENOUS
  Filled 2024-02-10 (×2): qty 50

## 2024-02-10 MED ORDER — SCOPOLAMINE 1 MG/3DAYS TD PT72
1.0000 | MEDICATED_PATCH | TRANSDERMAL | Status: DC
  Administered 2024-02-10 – 2024-02-13 (×2): 1.5 mg via TRANSDERMAL
  Filled 2024-02-10 (×2): qty 1

## 2024-02-10 MED ORDER — FUROSEMIDE 10 MG/ML IJ SOLN
40.0000 mg | Freq: Once | INTRAMUSCULAR | Status: AC
  Administered 2024-02-10: 40 mg via INTRAVENOUS
  Filled 2024-02-10: qty 4

## 2024-02-10 MED ORDER — AMIODARONE IV BOLUS ONLY 150 MG/100ML: 150.0000 mg | Freq: Once | INTRAVENOUS | Status: DC

## 2024-02-10 MED FILL — Sodium Bicarbonate IV Soln 8.4%: INTRAVENOUS | Qty: 50 | Status: AC

## 2024-02-10 MED FILL — Calcium Chloride Inj 10%: INTRAVENOUS | Qty: 10 | Status: AC

## 2024-02-10 MED FILL — Mannitol IV Soln 20%: INTRAVENOUS | Qty: 500 | Status: AC

## 2024-02-10 MED FILL — Heparin Sodium (Porcine) Inj 1000 Unit/ML: INTRAMUSCULAR | Qty: 20 | Status: AC

## 2024-02-10 MED FILL — Electrolyte-R (PH 7.4) Solution: INTRAVENOUS | Qty: 4000 | Status: AC

## 2024-02-10 MED FILL — Sodium Chloride IV Soln 0.9%: INTRAVENOUS | Qty: 300 | Status: AC

## 2024-02-10 NOTE — Plan of Care (Signed)

## 2024-02-10 NOTE — Anesthesia Postprocedure Evaluation (Signed)
 Anesthesia Post Note  Patient: [name]  Procedure(s) Performed: MITRAL VALVE REPAIR USING A SIMULUS SEMI-RIGID ANNULOPLASTY BAND (Chest) MAZE PROCEDURE CLIPPING, LEFT ATRIAL APPENDAGE USING A MEDTRONIC CLIP (Chest) ECHOCARDIOGRAM, TRANSESOPHAGEAL     Patient location during evaluation: SICU Anesthesia Type: General Level of consciousness: sedated Pain management: pain level controlled Vital Signs Assessment: post-procedure vital signs reviewed and stable Respiratory status: patient remains intubated per anesthesia plan Cardiovascular status: stable Postop Assessment: no apparent nausea or vomiting Anesthetic complications: no   No notable events documented.  Last Vitals:  Vitals:   02/10/24 1108 02/10/24 1200  BP:  135/85  Pulse:  (!) 103  Resp:  19  Temp: 36.7 C   SpO2:  97%    Last Pain:  Vitals:   02/10/24 1147  TempSrc:   PainSc: 6                  Christella App S

## 2024-02-10 NOTE — Evaluation (Signed)
 Physical Therapy Evaluation Patient Details Name: [name] MRN: [id number] DOB: [birth year] Today's Date: 02/10/2024  History of Present Illness  83 y.o. female presents to [facility] 02/08/24 for complex MVR and MAZE. PMHx: anemia, HTN, mitral valve prolapse, moderate mitral regurgitation, tricuspid regurgitation  Clinical Impression  Pt in bed upon arrival with husband present and agreeable to PT eval. PTA, pt was independent for mobility with no AD. In today's session, pt was able to roll and move from sidelying to sit with MinAx2 for safety with multiple lines. Pt was able to stand with MinAx2 and take lateral side-steps with CGA. Further transfer/gait deferred due to pt having an increase in feeling nauseous. Pt currently with functional limitations due to the deficits listed below (see PT Problem List). Pt would benefit from acute skilled PT to address functional impairments. Pt will have 24/7 physical assist at home upon d/c from the hospital. Recommending post-acute HHPT pending continued progress with mobility. Acute PT to follow.  BP 122/56, 81 BPM 95% SpO2 on  2L      If plan is discharge home, recommend the following: A little help with walking and/or transfers;A little help with bathing/dressing/bathroom;Assistance with cooking/housework;Assist for transportation;Help with stairs or ramp for entrance     Equipment Recommendations None recommended by PT     Functional Status Assessment Patient has had a recent decline in their functional status and demonstrates the ability to make significant improvements in function in a reasonable and predictable amount of time.     Precautions / Restrictions Precautions Precautions: Fall;Sternal Precaution Booklet Issued: Yes (comment) Recall of Precautions/Restrictions: Impaired Precaution/Restrictions Comments: A-line L radial, Chest tube, multiple IV's, Vass, catheter Restrictions Other Position/Activity Restrictions: sternal      Mobility   Bed Mobility Overal bed mobility: Needs Assistance Bed Mobility: Rolling, Sidelying to Sit, Sit to Sidelying Rolling: Min assist Sidelying to sit: Min assist, +2 for safety/equipment    Sit to sidelying: Mod assist, +2 for safety/equipment General bed mobility comments: MinA to roll onto left side with use of bed pad. Slight MinA to raise trunk with x2 for safety with lines. Able to shift hips foward with MinA. ModA for return to sidelying for LE management    Transfers Overall transfer level: Needs assistance Equipment used: None Transfers: Sit to/from Stand Sit to Stand: Min assist, +2 safety/equipment    General transfer comment: MinA for slight boost-up and steadying with x2 for safety with lines. Pt able to stand with hands on knees and momentum. Further gait/transfer limited by pt feeling nauseous. Able to side-step towards HOB with CGAx2 for lines    Ambulation/Gait    General Gait Details: deferred 2/2 feeling nauseous     Balance Overall balance assessment: Needs assistance, Mild deficits observed, not formally tested Sitting-balance support: No upper extremity supported, Feet supported Sitting balance-Leahy Scale: Fair     Standing balance support: No upper extremity supported, During functional activity Standing balance-Leahy Scale: Fair Standing balance comment: able to stand statically with no AD         Pertinent Vitals/Pain Pain Assessment Pain Assessment: No/denies pain    Home Living Family/patient expects to be discharged to:: (P) Private residence Living Arrangements: (P) Spouse/significant other Available Help at Discharge: (P) Family Type of Home: (P) House Home Access: (P) Stairs to enter Entrance Stairs-Rails: (P) None (garage door) Entrance Stairs-Number of Steps: (P) 3 (garage door) Alternate Level Stairs-Number of Steps: (P) flight Home Layout: (P) Two level;Able to live on main level with  bedroom/bathroom;Full bath on main level Home  Equipment: (P) Shower seat - built in;Grab bars - tub/shower;Rolling Walker (2 wheels);Hand held shower head      Prior Function Prior Level of Function : (P) Independent/Modified Independent;Driving;Working/employed    Mobility Comments: (P) Independent ADLs Comments: (P) Independent; drives, works     Extremity/Trunk Assessment   Upper Extremity Assessment Upper Extremity Assessment: Defer to OT evaluation    Lower Extremity Assessment Lower Extremity Assessment: Generalized weakness (B LE edema, alert to light touch)    Cervical / Trunk Assessment Cervical / Trunk Assessment: Normal  Communication   Communication Communication: No apparent difficulties    Cognition Arousal: Alert Behavior During Therapy: WFL for tasks assessed/performed, Flat affect   PT - Cognitive impairments: No apparent impairments   Following commands: Intact       Cueing Cueing Techniques: Verbal cues, Tactile cues      PT Assessment Patient needs continued PT services  PT Problem List Decreased strength;Decreased activity tolerance;Decreased balance;Decreased mobility;Decreased knowledge of precautions       PT Treatment Interventions DME instruction;Gait training;Stair training;Therapeutic activities;Functional mobility training;Therapeutic exercise;Balance training;Neuromuscular re-education;Patient/family education    PT Goals (Current goals can be found in the Care Plan section)  Acute Rehab PT Goals Patient Stated Goal: to feel better PT Goal Formulation: With patient/family Time For Goal Achievement: 02/24/24 Potential to Achieve Goals: Good    Frequency Min 2X/week     Co-evaluation   Reason for Co-Treatment: For patient/therapist safety;To address functional/ADL transfers PT goals addressed during session: Mobility/safety with mobility;Balance         AM-PAC PT "6 Clicks" Mobility  Outcome Measure Help needed turning from your back to your side while in a flat bed  without using bedrails?: A Little Help needed moving from lying on your back to sitting on the side of a flat bed without using bedrails?: A Little Help needed moving to and from a bed to a chair (including a wheelchair)?: A Little Help needed standing up from a chair using your arms (e.g., wheelchair or bedside chair)?: A Little Help needed to walk in hospital room?: A Lot Help needed climbing 3-5 steps with a railing? : A Lot 6 Click Score: 16    End of Session Equipment Utilized During Treatment: Oxygen Activity Tolerance: Other (comment) (limited by feeling nauseous) Patient left: in bed;with call bell/phone within reach;with family/visitor present Nurse Communication: Mobility status PT Visit Diagnosis: Unsteadiness on feet (R26.81);Muscle weakness (generalized) (M62.81);Other abnormalities of gait and mobility (R26.89)    Time: 0981-1914 PT Time Calculation (min) (ACUTE ONLY): 28 min   Charges:   PT Evaluation $PT Eval Low Complexity: 1 Low   PT General Charges $$ ACUTE PT VISIT: 1 Visit    Hilton Cork, PT, DPT Secure Chat Preferred  Rehab Office [phone]   Arturo Morton Brion Aliment 02/10/2024, 1:47 PM

## 2024-02-10 NOTE — Evaluation (Signed)
 Occupational Therapy Evaluation Patient Details Name: [name] MRN: [id number] DOB: Oct 18, 1941 Today's Date: 02/10/2024   History of Present Illness   83 y.o. female presents to [facility] 02/08/24 for complex MVR and MAZE. PMHx: anemia, HTN, mitral valve prolapse, moderate mitral regurgitation, tricuspid regurgitation     Clinical Impressions At baseline, pt is Independent with ADLs, IADLs , and functional mobility without an AD. Pt also drives and works full-time. Pt now presents with decreased activity tolerance, decreased knowledge of precautions, decreased balance, generalized B UE weakness (tested within sternal precautions), edematous B UE, impaired cardiopulmonary status, and decreased safety and independence with functional tasks. Pt currently demonstrates ability to complete UB ADLs with Set up to Min assist, LB ADLs with Mod to Max assist +2 for safety, with cues for compensatory techniques to complete ADLs while adhering to sternal precautions. Pt participated well in OT eval and is expected to make good progress toward goals. Pt VSS on 2L continuous O2 through nasal cannula throughout session. Pt will benefit from acute skilled OT services to address deficits outlined below, decrease caregiver burden, and increase safety and independence with functional tasks. Pending pt progress in acute rehab, OT recommends post-acute HH OT paired with 24/7 physical assist/supervision from family.     If plan is discharge home, recommend the following:   Two people to help with walking and/or transfers;Two people to help with bathing/dressing/bathroom;Assistance with cooking/housework;Assist for transportation;Help with stairs or ramp for entrance     Functional Status Assessment   Patient has had a recent decline in their functional status and demonstrates the ability to make significant improvements in function in a reasonable and predictable amount of time.     Equipment Recommendations    BSC/3in1     Recommendations for Other Services         Precautions/Restrictions   Precautions Precautions: Fall;Sternal Precaution Booklet Issued: Yes (comment) Recall of Precautions/Restrictions: Impaired Precaution/Restrictions Comments: A-line L radial, Chest tube, multiple IVs, [place], catheter Restrictions Other Position/Activity Restrictions: sternal     Mobility Bed Mobility Overal bed mobility: Needs Assistance Bed Mobility: Rolling, Sidelying to Sit, Sit to Sidelying Rolling: Min assist Sidelying to sit: Min assist, +2 for safety/equipment     Sit to sidelying: Mod assist, +2 for safety/equipment General bed mobility comments: MinA to roll onto left side with use of bed pad. Slight MinA to raise trunk with x2 for safety with lines. Able to shift hips foward with MinA. ModA for return to sidelying for LE management    Transfers Overall transfer level: Needs assistance Equipment used: None, 1 person hand held assist Transfers: Sit to/from Stand Sit to Stand: Min assist, +2 safety/equipment           General transfer comment: MinA for slight boost-up and steadying with x2 for safety with lines. Pt able to stand with hands on knees and momentum. Further gait/transfer limited by pt feeling nauseous. Able to side-step towards [facility] with CGAx2 for lines      Balance Overall balance assessment: Needs assistance, Mild deficits observed, not formally tested Sitting-balance support: No upper extremity supported, Feet supported Sitting balance-Leahy Scale: Fair     Standing balance support: No upper extremity supported, During functional activity Standing balance-Leahy Scale: Fair Standing balance comment: able to stand statically with no AD                           ADL either performed or assessed with clinical judgement  ADL Overall ADL's : Needs assistance/impaired Eating/Feeding: Set up;Sitting   Grooming: Contact guard assist;Cueing for  compensatory techniques;Sitting (adhering to sternal precautions)   Upper Body Bathing: Minimal assistance;Cueing for compensatory techniques;Sitting (adhering to sternal precautions) Upper Body Bathing Details (indicate cue type and reason): simulated at EOB Lower Body Bathing: Moderate assistance;Maximal assistance;+2 for safety/equipment;Sitting/lateral leans;Sit to/from stand;Cueing for compensatory techniques (adhering to sternal precautions) Lower Body Bathing Details (indicate cue type and reason): simulated at EOB Upper Body Dressing : Minimal assistance;Cueing for compensatory techniques;Sitting (adhering to sternal precautions)   Lower Body Dressing: Moderate assistance;Maximal assistance;Cueing for compensatory techniques;Sitting/lateral leans;Sit to/from stand;+2 for safety/equipment (adhering to sternal precautions)     Toilet Transfer Details (indicate cue type and reason): deferred this session due to pt with nausea worse in standing Toileting- Clothing Manipulation and Hygiene: Moderate assistance;Maximal assistance;Sitting/lateral lean;Sit to/from stand;Cueing for compensatory techniques;+2 for safety/equipment (adhering to sternal precautions) Toileting - Clothing Manipulation Details (indicate cue type and reason): simulated at EOB       General ADL Comments: Pt with decreased activity tolerance and nausea affecting functional level. Pt requiring cues for compensatory strategies to adhere to sternal precautions during tasks.     Vision Baseline Vision/History: 1 Wears glasses Ability to See in Adequate Light: 0 Adequate (with glasses) Patient Visual Report: No change from baseline       Perception         Praxis         Pertinent Vitals/Pain Pain Assessment Pain Assessment: No/denies pain     Extremity/Trunk Assessment Upper Extremity Assessment Upper Extremity Assessment: Right hand dominant;RUE deficits/detail;LUE deficits/detail;Generalized weakness  (tested within sternal precautions) RUE Deficits / Details: generalized weakess (tested within sternal precautions); edematous throughout UE RUE Sensation: WNL RUE Coordination: WNL LUE Deficits / Details: generalized weakess (tested within sternal precautions); edematous throughout UE LUE Sensation: WNL LUE Coordination: WNL   Lower Extremity Assessment Lower Extremity Assessment: Defer to PT evaluation   Cervical / Trunk Assessment Cervical / Trunk Assessment: Other exceptions;Normal Cervical / Trunk Exceptions: sternal incision; chest tube   Communication Communication Communication: No apparent difficulties   Cognition Arousal: Alert Behavior During Therapy: WFL for tasks assessed/performed, Flat affect Cognition: No apparent impairments             OT - Cognition Comments: Pt AOOx4 and pleasant throughout session.                 Following commands: Intact       Cueing  General Comments   Cueing Techniques: Verbal cues;Tactile cues  OT initiated training in compensatory strategies/techniques for completing ADLs while adhering to sternal precautions. OT also educated pt and her husband in positioning B UE to aid in edema management. Pt and husband verbalized understanding of all training but will benefit from reinforcement. Pt's VSS on 2L continuous O2 through nasal cannula throughout session. Pt's husband present throughotu session.   Exercises     Shoulder Instructions      Home Living Family/patient expects to be discharged to:: Private residence Living Arrangements: Spouse/significant other Available Help at Discharge: Family Type of Home: House Home Access: Stairs to enter [employer] of Steps: 3 (garage door) Entrance Stairs-Rails: None (garage door) Home Layout: Two level;Able to live on main level with bedroom/bathroom;Full bath on main level Alternate Level Stairs-Number of Steps: flight Alternate Level Stairs-Rails:  (stays on main  level) Bathroom Shower/Tub: Producer, television/film/video: Handicapped height     Home Equipment: Shower seat - built  in;Grab bars - tub/shower;Rolling Walker (2 wheels);Hand held shower head          Prior Functioning/Environment Prior Level of Function : Independent/Modified Independent;Driving;Working/employed             Mobility Comments: Independent ADLs Comments: Independent with ADLs and IADLs; drives; 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    OT Problem List: Decreased strength;Decreased activity tolerance;Impaired balance (sitting and/or standing);Decreased knowledge of use of DME or AE;Decreased knowledge of precautions;Cardiopulmonary status limiting activity;Increased edema   OT Treatment/Interventions: Self-care/ADL training;Energy conservation;DME and/or AE instruction;Therapeutic activities;Patient/family education;Balance training      OT Goals(Current goals can be found in the care plan section)   Acute Rehab OT Goals Patient Stated Goal: to return home and be able to return to work OT Goal Formulation: With patient/family Time For Goal Achievement: 02/24/24 Potential to Achieve Goals: Good ADL Goals Pt Will Perform Grooming: with supervision;standing (adhering to sternal precautions) Pt Will Perform Lower Body Bathing: with contact guard assist;sitting/lateral leans;sit to/from stand (adhering to sternal precautions) Pt Will Perform Upper Body Dressing: with supervision;sitting (adhering to sternal precautions) Pt Will Perform Lower Body Dressing: with contact guard assist;sitting/lateral leans;sit to/from stand (adhering to sternal precautions) Pt Will Transfer to Toilet: with supervision;ambulating;regular height toilet (adhering to sternal precautions; with least restrictive AD) Pt Will Perform Toileting - Clothing Manipulation and hygiene: with supervision;sitting/lateral leans;sit to/from stand (adhering to sternal precautions) Additional  ADL Goal #1: Patient will demonstrate ability to Independently state 4 energy conservation strategies to increase safety and independence with functional tasks with handout provided.   OT Frequency:  Min 2X/week    Co-evaluation PT/OT/SLP Co-Evaluation/Treatment: Yes Reason for Co-Treatment: For patient/therapist safety;To address functional/ADL transfers PT goals addressed during session: Mobility/safety with mobility;Balance OT goals addressed during session: ADL's and self-care      AM-PAC OT "6 Clicks" Daily Activity     Outcome Measure Help from another person eating meals?: A Little Help from another person taking care of personal grooming?: A Little Help from another person toileting, which includes using toliet, bedpan, or urinal?: A Lot Help from another person bathing (including washing, rinsing, drying)?: A Lot Help from another person to put on and taking off regular upper body clothing?: A Little Help from another person to put on and taking off regular lower body clothing?: A Lot 6 Click Score: 15   End of Session Equipment Utilized During Treatment: Oxygen Nurse Communication: Mobility status;Other (comment) (Pt with continued nausea. Vital signs.)  Activity Tolerance: Patient tolerated treatment well;Treatment limited secondary to medical complications (Comment) (limited by nause,a which was worse in standing) Patient left: in bed;with call bell/phone within reach;with family/visitor present  OT Visit Diagnosis: Other abnormalities of gait and mobility (R26.89);Muscle weakness (generalized) (M62.81);Other (comment) (decreased activity tolerance)                Time: 1610-9604 OT Time Calculation (min): 28 min Charges:  OT General Charges $OT Visit: 1 Visit OT Evaluation $OT Eval Low Complexity: 1 Low  Almando Brawley "Orson Eva., OTR/L, MA Acute Rehab [phone]  Lendon Colonel 02/10/2024, 6:26 PM

## 2024-02-10 NOTE — Consult Note (Signed)
 NAME:  [name], MRN:  [id number], DOB:  1941-06-11, LOS: 2 ADMISSION DATE:  02/08/2024, CONSULTATION DATE:/07/2024 REFERRING MD: Eugenio Hoes, CHIEF COMPLAINT:/07/2024  History of Present Illness:  83 year old female with hypertension, paroxysmal A-fib who initially presented with increasing shortness of breath and bilateral leg swelling, noted to have severe mitral valve regurgitation and moderate tricuspid valve regurgitation.  Her ejection fraction was 60%, she underwent evaluation with cardiac cath which showed no coronary artery disease, today she underwent mitral valve repair with maze procedure.  Patient remained intubated and was transferred to ICU postop.  PCCM was consulted for help evaluation medical management  Pertinent  Medical History   Past Medical History:  Diagnosis Date   Acid reflux    Anemia    Arthritis    Atrial fibrillation (HCC)    Cataract    Diverticulosis of colon    Endometrial cancer (HCC)    1A grade endometrial ca   Essential hypertension 01/24/2009   Qualifier: Diagnosis of  By: Nelson-Eyob Godlewski CMA (AAMA), Dottie     Hx of radiation therapy 04/24/08-05/29/08& 8/12/,8/19,&06/27/08   external beam  through 7/09 ,then intracavity in 06/2008   Hypertension    Mitral valve prolapse    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   Tricuspid regurgitation      Significant Hospital Events: Including procedures, antibiotic start and stop dates in addition to other pertinent events   4/9 Mitral valve repair s/p Maze procedure, extubated 4/10 Afib RVR on amio gtt, requiring pressors  4/11 A-fib controlled with Amio drip, weaning off pressors, still having some nausea secondary to vasoplegia but improving, up to chair, plan to walk later-overall progressing  Interim History / Subjective:  Alert, awake, oriented x 4  Up to chair Minimal mediastinal chest tube output  Objective   Blood pressure 139/73, pulse 70, temperature 98.2 F (36.8 C), resp. rate (!) 24,  height 5\' 4"  (1.626 m), weight 77.9 kg, SpO2 93%. PAP: (29-49)/(11-31) 38/14 CVP:  [10 mmHg-26 mmHg] 13 mmHg CO:  [3 L/min-4.2 L/min] 3.9 L/min CI:  [1.7 L/min/m2-2.4 L/min/m2] 2.2 L/min/m2      Intake/Output Summary (Last 24 hours) at 02/10/2024 1026 Last data filed at 02/10/2024 1000 Gross per 24 hour  Intake 2854.67 ml  Output 1388 ml  Net 1466.67 ml   Filed Weights   02/08/24 0655 02/09/24 0500 02/10/24 0500  Weight: 68.5 kg 73 kg 77.9 kg    Examination: General: Acutely ill older adult female up in chair, no distress HEENT: Normocephalic, PERRLA intact, pink mucous membranes, teeth present Cardiovascular: S1/S2, A-fib with controlled response, no MRG, no JVD Pulmonary: Clear throughout, no distress, previous chest tubes in place-serosanguineous output Abdomen: Bowel sounds, soft Skin: No rash Extremities: Cool but capillary refill less than 3 seconds throughout, no middle extremities, avoid 1+ pitting in the lower extremities bilaterally GU: Foley, yellow urine  Resolved Hospital Problem list   Acute respiratory insufficiency, postop-extubated on 4/9   Assessment & Plan:  Severe mitral regurgitation status post complex mitral valve repair Mild tricuspid regurgitation Paroxysmal A-fib status post maze with closure of left atrial appendage Afib RVR approximately at 0400, became hypotensive during amio bolus/admin  P: Continue Amio drip Continue chest tube management per T CTS Continue pain management with tramadol, oxycodone, morphine as needed Continue aspirin/Zetia Continue to wean off pressors Levophed, vaso as tolerated-goal MAP> 65 Continue Rocephin for surgical antibiotic prophylaxis Continue to monitor blood pressure via A-line  Hypertension Has been dealing with with hypotension with vasoplegia P:  Continue to hold all antihypertensives for now Continue MAP goal >65  Continue to wean pressors Levophed and vaso as above  AKI Cr improving Diuresing per  TCTs, lasix 40mg  IV given After review, with and during greatly P: Continue to trend renal function daily  Continue to monitor and optimize electrolytes daily Continue to monitor urine output Continue strict I/Os Continue Adequate renal perfusion  Avoid nephrotoxic agents  ) Nausea -Suspecting secondary to vasoplegia Current antiemetic regimen appears to be controlling nausea/vomiting per patient P: Continue Zofran If nausea becomes out of control, can order scopolamine patch  Hyperglycemia  P: Continue sliding scale insulin Continue Semglee 5 units daily Continue CBGs every 4 hours  Hyperlipidemia P: Continue Zetia  Expected perioperative blood loss anemia Hgb 7.8 from 8.8  P:  Continue to monitor h/h Transfuse for hgb <7   Best Practice (right click and "Reselect all SmartList Selections" daily)   Diet/type: NPO DVT prophylaxis: SCD GI prophylaxis: PPI Lines: Central line, Arterial Line, and yes and it is still needed Foley:  Yes, and it is still needed Code Status:  full code Last date of multidisciplinary goals of care discussion [Per primary team]   CC 35 mins   Christian Vedansh Kerstetter AGACNP-BC   [facility] 02/10/2024, 10:26 AM  Please see [email or web address] for pager details.  From 7A-7P if no response, please call [phone]. After hours, please call ELink [phone].

## 2024-02-10 NOTE — TOC Initial Note (Signed)
 Transition of Care [facility]) - Initial/Assessment Note    Patient Details  Name: [name] MRN: [id number] Date of Birth: 1941/02/23  Transition of Care [facility]) CM/SW Contact:    Elliot Cousin, RN Phone Number: [phone] 02/10/2024, 5:14 PM  Clinical Narrative:                 TOC CM spoke to pt and husband at bedside. Offered choice for [facility], Medicare.gov list with ratings placed in chart and provide to pt. Husband states he will review. Pt has access to a RW if needed.   Will continue to follow for dc needs.   Expected Discharge Plan: Home w Home Health Services Barriers to Discharge: Continued Medical Work up   Patient Goals and CMS Choice Patient states their goals for this hospitalization and ongoing recovery are:: to recover CMS Medicare.gov Compare Post Acute Care list provided to:: Patient Represenative (must comment) Choice offered to / list presented to : Spouse      Expected Discharge Plan and Services   Discharge Planning Services: CM Consult Post Acute Care Choice: Home Health Living arrangements for the past 2 months: Single Family Home                                      Prior Living Arrangements/Services Living arrangements for the past 2 months: Single Family Home Lives with:: Spouse Patient language and need for interpreter reviewed:: Yes Do you feel safe going back to the place where you live?: Yes      Need for Family Participation in Patient Care: Yes (Comment) Care giver support system in place?: Yes (comment) Current home services: DME (rolling walker) Criminal Activity/Legal Involvement Pertinent to Current Situation/Hospitalization: No - Comment as needed  Activities of Daily Living   ADL Screening (condition at time of admission) Independently performs ADLs?: Yes (appropriate for developmental age) Is the patient deaf or have difficulty hearing?: Yes Does the patient have difficulty seeing, even when wearing glasses/contacts?:  No Does the patient have difficulty concentrating, remembering, or making decisions?: No  Permission Sought/Granted Permission sought to share information with : Case Manager, Family Supports, PCP Permission granted to share information with : Yes, Verbal Permission Granted  Share Information with NAME: Chandelle Harkey  Permission granted to share info w AGENCY: Home Health, DME  Permission granted to share info w Relationship: husband  Permission granted to share info w Contact Information: [phone]  Emotional Assessment Appearance:: Appears stated age Attitude/Demeanor/Rapport: Lethargic Affect (typically observed): Appropriate Orientation: : Oriented to Self, Oriented to Place, Oriented to  Time, Oriented to Situation   Psych Involvement: No (comment)  Admission diagnosis:  Severe mitral regurgitation [I34.0] 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.1] Atrial fibrillation, unspecified type [facility]) [I48.91] Patient Active Problem List   Diagnosis Date Noted   Severe mitral regurgitation 02/08/2024   Mitral regurgitation 12/31/2023   Nonrheumatic mitral valve regurgitation 12/30/2023   Atrial fibrillation with RVR (HCC) 12/30/2023   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   Anemia 02/01/2019   Iron deficiency anemia 11/08/2018   Cancer (HCC)    Enteritis 09/17/2011   CANCER, ENDOMETRIUM 01/24/2009   Essential hypertension 01/24/2009   DIVERTICULOSIS OF COLON 01/24/2009   RECTAL BLEEDING 01/24/2009   Diarrhea 01/24/2009   HEMORRHOIDS, HX OF 01/24/2009   PCP:  Merri Brunette, MD Pharmacy:   [phone] - [place], [place] - [address]  [place] [address] Phone: [phone] Fax: [phone]  [facility] - [place], [place] - [address] [place] [address] Phone: [phone] Fax: [phone]     Social Drivers of Health (SDOH) Social History: SDOH Screenings   Food Insecurity: No Food  Insecurity (02/08/2024)  Housing: Low Risk  (02/08/2024)  Transportation Needs: No Transportation Needs (02/08/2024)  Utilities: Not At Risk (02/08/2024)  Depression (PHQ2-9): Low Risk  (12/20/2023)  Social Connections: Socially Integrated (02/09/2024)  Tobacco Use: Low Risk  (02/08/2024)   SDOH Interventions:     Readmission Risk Interventions     No data to display

## 2024-02-10 NOTE — Progress Notes (Signed)
 TCTS DAILY ICU PROGRESS NOTE                   [address]            [place] [address]          [phone]   2 Days Post-Op Procedure(s) (LRB): MITRAL VALVE REPAIR USING A SIMULUS SEMI-RIGID ANNULOPLASTY BAND (N/A) MAZE PROCEDURE (N/A) CLIPPING, LEFT ATRIAL APPENDAGE USING A MEDTRONIC CLIP (N/A) ECHOCARDIOGRAM, TRANSESOPHAGEAL (N/A)  Total Length of Stay:  LOS: 2 days   Subjective: Pain control better, some nausea- multifactorial, hopefully will tolerate amio  Objective: Vital signs in last 24 hours: Temp:  [98.1 F (36.7 C)-100.4 F (38 C)] 98.2 F (36.8 C) (04/11 0615) Pulse Rate:  [66-131] 100 (04/11 0645) Cardiac Rhythm: Atrial fibrillation (04/11 0400) Resp:  [10-38] 17 (04/11 0645) BP: (108-139)/(60-73) 139/73 (04/11 0645) SpO2:  [88 %-100 %] 99 % (04/11 0645) Arterial Line BP: (84-157)/(43-90) 135/58 (04/11 0645) Weight:  [77.9 kg] 77.9 kg (04/11 0500)  Filed Weights   02/08/24 0655 02/09/24 0500 02/10/24 0500  Weight: 68.5 kg 73 kg 77.9 kg    Weight change: 4.9 kg   Hemodynamic parameters for last 24 hours: PAP: (29-49)/(8-31) 38/14 CVP:  [10 mmHg-26 mmHg] 13 mmHg CO:  [3 L/min-4.3 L/min] 3.9 L/min CI:  [1.7 L/min/m2-2.5 L/min/m2] 2.2 L/min/m2  Intake/Output from previous day: 04/10 0701 - 04/11 0700 In: 3369.4 [P.O.:180; I.V.:2789.4; IV Piggyback:399.9] Out: 1343 [Urine:933; Chest Tube:410]  Intake/Output this shift: No intake/output data recorded.  Current Meds: Scheduled Meds:  acetaminophen  1,000 mg Oral Q6H   acetaminophen  650 mg Oral Once   aspirin EC  325 mg Oral Daily   bisacodyl  10 mg Oral Daily   Or   bisacodyl  10 mg Rectal Daily   Chlorhexidine Gluconate Cloth  6 each Topical Daily   docusate sodium  200 mg Oral Daily   enoxaparin (LOVENOX) injection  30 mg Subcutaneous Q24H   ezetimibe  10 mg Oral Daily   furosemide  40 mg Intravenous Once   insulin aspart  2-6 Units Subcutaneous Q4H    metoprolol tartrate  12.5 mg Oral BID   pantoprazole  40 mg Oral Daily   sodium chloride flush  3 mL Intravenous Q12H   sodium chloride flush  3-10 mL Intravenous Q12H   traMADol  50 mg Oral Q6H   Continuous Infusions:  amiodarone 30 mg/hr (02/10/24 0651)   amiodarone     insulin Stopped (02/09/24 1209)   lactated ringers 20 mL/hr at 02/10/24 0651   norepinephrine (LEVOPHED) Adult infusion 10 mcg/min (02/10/24 0651)   phenylephrine (NEO-SYNEPHRINE) Adult infusion Stopped (02/09/24 1555)   vasopressin 0.04 Units/min (02/10/24 0651)   PRN Meds:.dextrose, metoprolol tartrate, morphine injection, ondansetron (ZOFRAN) IV, mouth rinse, oxyCODONE, sodium chloride flush, sodium chloride flush  General appearance: alert, cooperative, and no distress Heart: irregularly irregular rhythm Lungs: clear to auscultation bilaterally Abdomen: benign Extremities: some edema, non pitting Wound: incis dressed  Lab Results: CBC: Recent Labs    02/09/24 1602 02/10/24 0355  WBC 23.9* 27.5*  HGB 8.2* 7.9*  HCT 25.6* 24.4*  PLT 142* 121*   BMET:  Recent Labs    02/09/24 1602 02/10/24 0355  NA 136 133*  K 3.6 4.7  CL 105 106  CO2 18* 19*  GLUCOSE 224* 152*  BUN 18 21  CREATININE 1.27* 1.17*  CALCIUM 6.7* 6.9*    CMET: Lab Results  Component Value Date  WBC 27.5 (H) 02/10/2024   HGB 7.9 (L) 02/10/2024   HCT 24.4 (L) 02/10/2024   PLT 121 (L) 02/10/2024   GLUCOSE 152 (H) 02/10/2024   CHOL 104 04/29/2021   TRIG 157 (H) 04/29/2021   HDL 38 (L) 04/29/2021   LDLCALC 39 04/29/2021   ALT 15 02/07/2024   AST 22 02/07/2024   NA 133 (L) 02/10/2024   K 4.7 02/10/2024   CL 106 02/10/2024   CREATININE 1.17 (H) 02/10/2024   BUN 21 02/10/2024   CO2 19 (L) 02/10/2024   TSH 3.482 12/30/2023   INR 1.6 (H) 02/08/2024   HGBA1C 4.7 (L) 02/07/2024      PT/INR:  Recent Labs    02/08/24 1300  LABPROT 19.5*  INR 1.6*   Radiology: No results found.   Assessment/Plan: S/P Procedure(s)  (LRB): MITRAL VALVE REPAIR USING A SIMULUS SEMI-RIGID ANNULOPLASTY BAND (N/A) MAZE PROCEDURE (N/A) CLIPPING, LEFT ATRIAL APPENDAGE USING A MEDTRONIC CLIP (N/A) ECHOCARDIOGRAM, TRANSESOPHAGEAL (N/A) POD#2  1 Tmax 100.4, s BP 100's-130's, afib, RVR at times 2 gtts : amio, levo and vasopressin, hemodynamics ok, CI 2.2, CVP 13, PA- mildly elevated 3 O2 sats ok on 2 l 4 weight up approx 9 KG from preop if accurate, fair UOP- lasix ordered 5 CT 410 /24 h- leave for now 6 minor hyponatremia- monitor  7 creat 1.17- monitor, CI was low for a while yesterday as levo was titrated and vasopressin initiated 8 leukocytosis trending up, monitor closely as low grade fever too- may need to culture 9 expected ABLA - holding fairly stable  10 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

## 2024-02-11 DIAGNOSIS — D72829 Elevated white blood cell count, unspecified: Secondary | ICD-10-CM | POA: Diagnosis not present

## 2024-02-11 DIAGNOSIS — I340 Nonrheumatic mitral (valve) insufficiency: Secondary | ICD-10-CM | POA: Diagnosis not present

## 2024-02-11 DIAGNOSIS — R112 Nausea with vomiting, unspecified: Secondary | ICD-10-CM | POA: Diagnosis not present

## 2024-02-11 DIAGNOSIS — E162 Hypoglycemia, unspecified: Secondary | ICD-10-CM | POA: Diagnosis not present

## 2024-02-11 LAB — MAGNESIUM: Magnesium: 2.4 mg/dL (ref 1.7–2.4)

## 2024-02-11 LAB — BASIC METABOLIC PANEL WITH GFR
Anion gap: 12 (ref 5–15)
BUN: 24 mg/dL — ABNORMAL HIGH (ref 8–23)
CO2: 19 mmol/L — ABNORMAL LOW (ref 22–32)
Calcium: 6.7 mg/dL — ABNORMAL LOW (ref 8.9–10.3)
Chloride: 101 mmol/L (ref 98–111)
Creatinine, Ser: 1.18 mg/dL — ABNORMAL HIGH (ref 0.44–1.00)
GFR, Estimated: 47 mL/min — ABNORMAL LOW (ref >60)
Glucose, Bld: 126 mg/dL — ABNORMAL HIGH (ref 70–99)
Potassium: 4.4 mmol/L (ref 3.5–5.1)
Sodium: 132 mmol/L — ABNORMAL LOW (ref 135–145)

## 2024-02-11 LAB — CBC
HCT: 23.4 % — ABNORMAL LOW (ref 36.0–46.0)
Hemoglobin: 7.6 g/dL — ABNORMAL LOW (ref 12.0–15.0)
MCH: 30.3 pg (ref 26.0–34.0)
MCHC: 32.5 g/dL (ref 30.0–36.0)
MCV: 93.2 fL (ref 80.0–100.0)
Platelets: 143 10*3/uL — ABNORMAL LOW (ref 150–400)
RBC: 2.51 MIL/uL — ABNORMAL LOW (ref 3.87–5.11)
RDW: 21.5 % — ABNORMAL HIGH (ref 11.5–15.5)
WBC: 26.5 10*3/uL — ABNORMAL HIGH (ref 4.0–10.5)
nRBC: 0.3 % — ABNORMAL HIGH (ref 0.0–0.2)

## 2024-02-11 LAB — GLUCOSE, CAPILLARY
Glucose-Capillary: 106 mg/dL — ABNORMAL HIGH (ref 70–99)
Glucose-Capillary: 109 mg/dL — ABNORMAL HIGH (ref 70–99)
Glucose-Capillary: 119 mg/dL — ABNORMAL HIGH (ref 70–99)
Glucose-Capillary: 122 mg/dL — ABNORMAL HIGH (ref 70–99)
Glucose-Capillary: 122 mg/dL — ABNORMAL HIGH (ref 70–99)
Glucose-Capillary: 175 mg/dL — ABNORMAL HIGH (ref 70–99)

## 2024-02-11 LAB — PHOSPHORUS: Phosphorus: 2.2 mg/dL — ABNORMAL LOW (ref 2.5–4.6)

## 2024-02-11 MED ORDER — POTASSIUM PHOSPHATES 15 MMOLE/5ML IV SOLN
15.0000 mmol | Freq: Once | INTRAVENOUS | Status: AC
  Administered 2024-02-11: 15 mmol via INTRAVENOUS
  Filled 2024-02-11: qty 5

## 2024-02-11 MED ORDER — SODIUM CHLORIDE 0.9 % IV SOLN: INTRAVENOUS | Status: AC | PRN

## 2024-02-11 MED ORDER — FUROSEMIDE 10 MG/ML IJ SOLN
40.0000 mg | Freq: Once | INTRAMUSCULAR | Status: AC
  Administered 2024-02-11: 40 mg via INTRAVENOUS
  Filled 2024-02-11: qty 4

## 2024-02-11 MED ORDER — PROCHLORPERAZINE EDISYLATE 10 MG/2ML IJ SOLN
5.0000 mg | Freq: Four times a day (QID) | INTRAMUSCULAR | Status: AC
  Administered 2024-02-11 – 2024-02-12 (×4): 5 mg via INTRAVENOUS
  Filled 2024-02-11 (×4): qty 1

## 2024-02-11 MED ORDER — BOOST / RESOURCE BREEZE PO LIQD CUSTOM
1.0000 | Freq: Three times a day (TID) | ORAL | Status: DC
  Administered 2024-02-12 – 2024-02-16 (×10): 1 via ORAL
  Administered 2024-02-16: 237 mL via ORAL
  Administered 2024-02-17 – 2024-02-21 (×12): 1 via ORAL

## 2024-02-11 MED ORDER — PIPERACILLIN-TAZOBACTAM 3.375 G IVPB
3.3750 g | Freq: Three times a day (TID) | INTRAVENOUS | Status: AC
  Administered 2024-02-11 – 2024-02-15 (×15): 3.375 g via INTRAVENOUS
  Filled 2024-02-11 (×15): qty 50

## 2024-02-11 NOTE — Progress Notes (Signed)
 [address]       [place] [address]             [phone]      3 Days Post-Op  Procedure(s) (LRB): MITRAL VALVE REPAIR USING A SIMULUS SEMI-RIGID ANNULOPLASTY BAND (N/A) MAZE PROCEDURE (N/A) CLIPPING, LEFT ATRIAL APPENDAGE USING A MEDTRONIC CLIP (N/A) ECHOCARDIOGRAM, TRANSESOPHAGEAL (N/A)   Total Length of Stay:  LOS: 3 days    SUBJECTIVE: Still nauseated but feels improved with patch No abdominal pain Feels wiped out  Vitals:   02/11/24 0645 02/11/24 0700  BP:    Pulse: 62 70  Resp: 14 (!) 24  Temp:    SpO2: 99% 96%    Intake/Output      04/11 0701 04/12 0700 04/12 0701 04/13 0700   P.O.     I.V. (mL/kg) 1392.2 (18.4)    IV Piggyback     Total Intake(mL/kg) 1392.2 (18.4)    Urine (mL/kg/hr) 1390 (0.8)    Emesis/NG output     Chest Tube 190    Total Output 1580    Net -187.8             amiodarone 30 mg/hr (02/11/24 0700)   norepinephrine (LEVOPHED) Adult infusion 11 mcg/min (02/11/24 0700)   phenylephrine (NEO-SYNEPHRINE) Adult infusion Stopped (02/09/24 1555)   piperacillin-tazobactam (ZOSYN)  IV     vasopressin 0.04 Units/min (02/11/24 0700)    CBC    Component Value Date/Time   WBC 26.5 (H) 02/11/2024 0515   RBC 2.51 (L) 02/11/2024 0515   HGB 7.6 (L) 02/11/2024 0515   HGB 11.1 (L) 12/05/2023 0803   HGB 12.8 05/28/2008 1551   HCT 23.4 (L) 02/11/2024 0515   HCT 36.6 05/28/2008 1551   PLT 143 (L) 02/11/2024 0515   PLT 174 12/05/2023 0803   PLT 164 05/28/2008 1551   MCV 93.2 02/11/2024 0515   MCV 89.3 05/28/2008 1551   MCH 30.3 02/11/2024 0515   MCHC 32.5 02/11/2024 0515   RDW 21.5 (H) 02/11/2024 0515   RDW 13.0 05/28/2008 1551   LYMPHSABS 1.5 12/05/2023 0803   LYMPHSABS 0.4 (L) 05/28/2008 1551   MONOABS 0.7 12/05/2023 0803   MONOABS 0.8 05/28/2008 1551   EOSABS 0.3 12/05/2023 0803   EOSABS 0.3 05/28/2008 1551   BASOSABS 0.1 12/05/2023 0803   BASOSABS 0.0 05/28/2008 1551   CMP     Component  Value Date/Time   NA 132 (L) 02/11/2024 0515   NA 139 12/16/2023 1638   K 4.4 02/11/2024 0515   CL 101 02/11/2024 0515   CO2 19 (L) 02/11/2024 0515   GLUCOSE 126 (H) 02/11/2024 0515   BUN 24 (H) 02/11/2024 0515   BUN 17 12/16/2023 1638   CREATININE 1.18 (H) 02/11/2024 0515   CALCIUM 6.7 (L) 02/11/2024 0515   PROT 6.1 (L) 02/07/2024 1042   PROT 6.4 04/29/2021 1102   ALBUMIN 3.8 02/07/2024 1042   ALBUMIN 4.4 04/29/2021 1102   AST 22 02/07/2024 1042   ALT 15 02/07/2024 1042   ALKPHOS 24 (L) 02/07/2024 1042   BILITOT 0.7 02/07/2024 1042   BILITOT 0.5 04/29/2021 1102   GFRNONAA 47 (L) 02/11/2024 0515   GFRAA >90 09/19/2011 0537   ABG    Component Value Date/Time   PHART 7.367 02/08/2024 1849   PCO2ART 41.3 02/08/2024 1849   PO2ART 130 (H) 02/08/2024 1849   HCO3 23.9 02/08/2024 1849   TCO2 25 02/08/2024 1849   ACIDBASEDEF 2.0 02/08/2024 1849  O2SAT 99 02/08/2024 1849   CBG (last 3)  Recent Labs    02/10/24 2012 02/10/24 2314 02/11/24 0511  GLUCAP 141* 141* 122*  EXAM Lungs; with crackles Card: RR with no murmur Ext: Warrm Abd: soft nontender Neuro: Intact   ASSESSMENT: POD #3 sp mv repair pulm vein isolation maze Hemodynamics improved with now able to wean levo for SBP > 100. Will get out aline after off levo Now in NSR; Will apace to assist coming off levo Hold anticoagulation for now. Continue amio IV due to nausea Remove chest tubes and foley Lasix today   Melene Sportsman, MD 02/11/2024

## 2024-02-11 NOTE — Progress Notes (Signed)
 NAME:  [name], MRN:  [id number], DOB:  1940/11/22, LOS: 3 ADMISSION DATE:  02/08/2024, CONSULTATION DATE:/07/2024 REFERRING MD: Melene Sportsman, CHIEF COMPLAINT:/07/2024  History of Present Illness:  83 year old female with hypertension, paroxysmal A-fib who initially presented with increasing shortness of breath and bilateral leg swelling, noted to have severe mitral valve regurgitation and moderate tricuspid valve regurgitation.  Her ejection fraction was 60%, she underwent evaluation with cardiac cath which showed no coronary artery disease, today she underwent mitral valve repair with maze procedure.  Patient remained intubated and was transferred to ICU postop.  PCCM was consulted for help evaluation medical management  Pertinent  Medical History   Past Medical History:  Diagnosis Date   Acid reflux    Anemia    Arthritis    Atrial fibrillation (HCC)    Cataract    Diverticulosis of colon    Endometrial cancer (HCC)    1A grade endometrial ca   Essential hypertension 01/24/2009   Qualifier: Diagnosis of  By: Nelson-Smith CMA (AAMA), Dottie     Hx of radiation therapy 04/24/08-05/29/08& 8/12/,8/19,&06/27/08   external beam  through 7/09 ,then intracavity in 06/2008   Hypertension    Mitral valve prolapse    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   Tricuspid regurgitation      Significant Hospital Events: Including procedures, antibiotic start and stop dates in addition to other pertinent events   4/9 Mitral valve repair s/p Maze procedure, extubated 4/10 Afib RVR on amio gtt, requiring pressors  4/11 A-fib controlled with Amio drip, weaning off pressors, still having some nausea secondary to vasoplegia but improving, up to chair, plan to walk later-overall progressing  Interim History / Subjective:  Still having nausea. Was able to keep jello down. A-pacing with improvement in BP.  Objective   Blood pressure (!) 133/95, pulse 70, temperature 98 F (36.7 C), temperature  source Oral, resp. rate (!) 24, height 5\' 4"  (1.626 m), weight 75.5 kg, SpO2 96%. PAP: (25-33)/(6-13) 25/6      Intake/Output Summary (Last 24 hours) at 02/11/2024 0729 Last data filed at 02/11/2024 0700 Gross per 24 hour  Intake 1392.16 ml  Output 1580 ml  Net -187.84 ml   Filed Weights   02/09/24 0500 02/10/24 0500 02/11/24 0500  Weight: 73 kg 77.9 kg 75.5 kg    Examination: General: ill appearing woman lying in bed in NAD HEENT: [place]/AT, eyes anicteric Cardiovascular: S1S2, RRR Pulmonary: breathing comfortably on [place], basilar rhales. No conversational dyspnea.  Abdomen: Soft, NT Skin: warm, dry, no rashes Extremities: mild edema GU: foley with yellow urine  Na+ 132 Bicarb 19, AG 12 Phos 2.2 WBC 26.5 H/H 7.6/23.4 Platelets 143  Resolved Hospital Problem list   Acute respiratory insufficiency, postop-extubated on 4/9   Assessment & Plan:  Severe mitral regurgitation status post complex mitral valve repair Mild tricuspid regurgitation Paroxysmal A-fib, status post MAZE with closure of left atrial appendage > back in sinus rhythm Post-op vasoplegia -con't A-pacing today in 80s  -chest tubes out today -pain management  -Continue aspirin metoprolol vasopressors - Continue amiodarone infusion - Holding PTA Eliquis; plan to start heparin tomorrow -Okay to discontinue arterial line today  Nausea and vomiting -con't scopolamine patch -adding scheduled compazine -zofran PRN  Leukocytosis, persistent without clear etiology - Blood cultures - D/C Foley - Add Zosyn  H/o hypertension -Continue to hold antihypertensives while on vasopressors  AKI; stable -Renally dose meds and avoid nephrotoxic meds - Strict I's/O - Monitor - Maintain adequate renal perfusion.  Hyperglycemia, controlled -Semglee 5 units daily - SSI PRN -goal BG <180  Hyperlipidemia -Continue Zetia  Expected perioperative blood loss anemia -Transfuse for hemoglobin less than 7 or  hemodynamically significant bleeding  Hypophosphatemia - Repleted  Patient updated at bedside with Dr. [name] today.  Best Practice (right click and "Reselect all SmartList Selections" daily)   Diet/type: ADAT DVT prophylaxis: SCD GI prophylaxis: PPI Lines: Central line,  yes and it is still needed- vasopressors Foley:  d/c ordered Code Status:  full code Last date of multidisciplinary goals of care discussion [ ]   This patient is critically ill with multiple organ system failure which requires frequent high complexity decision making, assessment, support, evaluation, and titration of therapies. This was completed through the application of advanced monitoring technologies and extensive interpretation of multiple databases. During this encounter critical care time was devoted to patient care services described in this note for 36 minutes.  Joesph Mussel, DO 02/11/24 8:32 AM [facility]  For contact information, see Amion. If no response to pager, please call PCCM consult pager. After hours, 7PM- 7AM, please call Elink.

## 2024-02-12 DIAGNOSIS — I340 Nonrheumatic mitral (valve) insufficiency: Secondary | ICD-10-CM | POA: Diagnosis not present

## 2024-02-12 DIAGNOSIS — D72829 Elevated white blood cell count, unspecified: Secondary | ICD-10-CM | POA: Diagnosis not present

## 2024-02-12 DIAGNOSIS — R112 Nausea with vomiting, unspecified: Secondary | ICD-10-CM | POA: Diagnosis not present

## 2024-02-12 DIAGNOSIS — E162 Hypoglycemia, unspecified: Secondary | ICD-10-CM | POA: Diagnosis not present

## 2024-02-12 LAB — GLUCOSE, CAPILLARY
Glucose-Capillary: 107 mg/dL — ABNORMAL HIGH (ref 70–99)
Glucose-Capillary: 99 mg/dL (ref 70–99)
Glucose-Capillary: 152 mg/dL — ABNORMAL HIGH (ref 70–99)
Glucose-Capillary: 88 mg/dL (ref 70–99)
Glucose-Capillary: 113 mg/dL — ABNORMAL HIGH (ref 70–99)
Glucose-Capillary: 100 mg/dL — ABNORMAL HIGH (ref 70–99)

## 2024-02-12 LAB — BASIC METABOLIC PANEL WITH GFR
Anion gap: 10 (ref 5–15)
BUN: 25 mg/dL — ABNORMAL HIGH (ref 8–23)
CO2: 22 mmol/L (ref 22–32)
Calcium: 6.7 mg/dL — ABNORMAL LOW (ref 8.9–10.3)
Chloride: 102 mmol/L (ref 98–111)
Creatinine, Ser: 1.22 mg/dL — ABNORMAL HIGH (ref 0.44–1.00)
GFR, Estimated: 45 mL/min — ABNORMAL LOW (ref >60)
Glucose, Bld: 111 mg/dL — ABNORMAL HIGH (ref 70–99)
Potassium: 4.1 mmol/L (ref 3.5–5.1)
Sodium: 134 mmol/L — ABNORMAL LOW (ref 135–145)

## 2024-02-12 LAB — CBC
HCT: 22.1 % — ABNORMAL LOW (ref 36.0–46.0)
Hemoglobin: 7.1 g/dL — ABNORMAL LOW (ref 12.0–15.0)
MCH: 29.7 pg (ref 26.0–34.0)
MCHC: 32.1 g/dL (ref 30.0–36.0)
MCV: 92.5 fL (ref 80.0–100.0)
Platelets: 112 10*3/uL — ABNORMAL LOW (ref 150–400)
RBC: 2.39 MIL/uL — ABNORMAL LOW (ref 3.87–5.11)
RDW: 21.8 % — ABNORMAL HIGH (ref 11.5–15.5)
WBC: 13.5 10*3/uL — ABNORMAL HIGH (ref 4.0–10.5)
nRBC: 1 % — ABNORMAL HIGH (ref 0.0–0.2)

## 2024-02-12 LAB — MAGNESIUM: Magnesium: 2.5 mg/dL — ABNORMAL HIGH (ref 1.7–2.4)

## 2024-02-12 LAB — APTT: aPTT: 128 s — ABNORMAL HIGH (ref 24–36)

## 2024-02-12 LAB — PHOSPHORUS: Phosphorus: 2.4 mg/dL — ABNORMAL LOW (ref 2.5–4.6)

## 2024-02-12 LAB — HEPARIN LEVEL (UNFRACTIONATED): Heparin Unfractionated: 0.44 [IU]/mL (ref 0.30–0.70)

## 2024-02-12 LAB — PREPARE RBC (CROSSMATCH)

## 2024-02-12 MED ORDER — POTASSIUM PHOSPHATES 15 MMOLE/5ML IV SOLN
15.0000 mmol | Freq: Once | INTRAVENOUS | Status: AC
  Administered 2024-02-12: 15 mmol via INTRAVENOUS
  Filled 2024-02-12: qty 5

## 2024-02-12 MED ORDER — HYALURONIDASE HUMAN 150 UNIT/ML IJ SOLN
150.0000 [IU] | Freq: Once | INTRAMUSCULAR | Status: AC
  Administered 2024-02-12: 150 [IU] via SUBCUTANEOUS
  Filled 2024-02-12: qty 1

## 2024-02-12 MED ORDER — SENNA 8.6 MG PO TABS
1.0000 | ORAL_TABLET | Freq: Every day | ORAL | Status: DC
  Administered 2024-02-12 – 2024-02-15 (×2): 8.6 mg via ORAL
  Filled 2024-02-12 (×4): qty 1

## 2024-02-12 MED ORDER — SODIUM CHLORIDE 0.9% IV SOLUTION: Freq: Once | INTRAVENOUS | Status: AC

## 2024-02-12 MED ORDER — HEPARIN (PORCINE) 25000 UT/250ML-% IV SOLN
900.0000 [IU]/h | INTRAVENOUS | Status: DC
  Administered 2024-02-12: 750 [IU]/h via INTRAVENOUS
  Filled 2024-02-12: qty 250

## 2024-02-12 NOTE — TOC Progression Note (Addendum)
 Transition of Care [facility]) - Progression Note    Patient Details  Name: [name] MRN: [id number] Date of Birth: [birth year]  [facility]) CM/SW Contact  Omie Bickers, RN Phone Number: 02/12/2024, 1:38 PM  Clinical Narrative:     Met with patient and spouse at bedside. They are agreeable to [facility] services. Would like referral placed to [facility]. Notified Glenda of referral, accepted. Patient states she may be able to borrow a RW but is not sure.    Expected Discharge Plan: Home w Home Health Services Barriers to Discharge: Continued Medical Work up  Expected Discharge Plan and Services   Discharge Planning Services: CM Consult Post Acute Care Choice: Home Health Living arrangements for the past 2 months: Single Family Home                           HH Arranged: OT, PT HH Agency: [facility] Date HH Agency Contacted: 02/12/24 Time HH Agency Contacted: 1338 Representative spoke with at [facility] Agency: [facility]   Social Determinants of Health (SDOH) Interventions SDOH Screenings   Food Insecurity: No Food Insecurity (02/08/2024)  Housing: Low Risk  (02/08/2024)  Transportation Needs: No Transportation Needs (02/08/2024)  Utilities: Not At Risk (02/08/2024)  Depression (PHQ2-9): Low Risk  (12/20/2023)  Social Connections: Socially Integrated (02/09/2024)  Tobacco Use: Low Risk  (02/08/2024)    Readmission Risk Interventions     No data to display

## 2024-02-12 NOTE — Progress Notes (Signed)
 NAME:  [name], MRN:  [id number], DOB:  10-04-1941, LOS: 4 ADMISSION DATE:  02/08/2024, CONSULTATION DATE:/07/2024 REFERRING MD: Melene Sportsman, CHIEF COMPLAINT:/07/2024  History of Present Illness:  83 year old female with hypertension, paroxysmal A-fib who initially presented with increasing shortness of breath and bilateral leg swelling, noted to have severe mitral valve regurgitation and moderate tricuspid valve regurgitation.  Her ejection fraction was 60%, she underwent evaluation with cardiac cath which showed no coronary artery disease, today she underwent mitral valve repair with maze procedure.  Patient remained intubated and was transferred to ICU postop.  PCCM was consulted for help evaluation medical management  Pertinent  Medical History   Past Medical History:  Diagnosis Date   Acid reflux    Anemia    Arthritis    Atrial fibrillation (HCC)    Cataract    Diverticulosis of colon    Endometrial cancer (HCC)    1A grade endometrial ca   Essential hypertension 01/24/2009   Qualifier: Diagnosis of  By: Nelson-Smith CMA (AAMA), Dottie     Hx of radiation therapy 04/24/08-05/29/08& 8/12/,8/19,&06/27/08   external beam  through 7/09 ,then intracavity in 06/2008   Hypertension    Mitral valve prolapse    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   Tricuspid regurgitation      Significant Hospital Events: Including procedures, antibiotic start and stop dates in addition to other pertinent events   4/9 Mitral valve repair s/p Maze procedure, extubated 4/10 Afib RVR on amio gtt, requiring pressors  4/11 A-fib controlled with Amio drip, weaning off pressors, still having some nausea secondary to vasoplegia but improving, up to chair, plan to walk later-overall progressing  Interim History / Subjective:  Still having some nausea, but improved. Still on vasopressors. Foley replaced for retention.  Objective   Blood pressure (!) 97/53, pulse 85, temperature 97.7 F (36.5 C),  temperature source Axillary, resp. rate 15, height 5\' 4"  (1.626 m), weight 76.8 kg, SpO2 92%.        Intake/Output Summary (Last 24 hours) at 02/12/2024 0750 Last data filed at 02/12/2024 0700 Gross per 24 hour  Intake 1429.24 ml  Output 1455 ml  Net -25.76 ml   Filed Weights   02/10/24 0500 02/11/24 0500 02/12/24 0500  Weight: 77.9 kg 75.5 kg 76.8 kg    Examination: General: chronically ill appearing woman lying in bed in NAD HEENT:  [place]/AT, eyes anicteric Cardiovascular: S1S2, RRR, A-paced on tele Pulmonary: breathing comfortably on [place], reduced basilar breath sounds Abdomen: soft, NT Skin: warm, dry, no rashes Extremities: +LE edema GU: clear yellow urine  Na+ 134 Bicarb 22, AG 10 Phos 2.4 WBC 13.5 H/H  7.1/22.1 Platelets 112 Blood cultures: NGTD  Resolved Hospital Problem list   Acute respiratory insufficiency, postop-extubated on 4/9   Assessment & Plan:  Severe mitral regurgitation status post complex mitral valve repair Mild tricuspid regurgitation Paroxysmal A-fib, status post MAZE with closure of left atrial appendage > back in sinus rhythm Post-op vasoplegia -con't A-pacing -pain management per protocol -con't aspirin, statin -metoprolol on hold while on vasopressors -holding PTA eliquis, start heparin  gtt today  Nausea and vomiting -con't  scopolamine patch -con't scheduled compazine -zofran as needed -watch QTc  Urinary retention, due to scopolamine patch -foley until scop patch can come off  Leukocytosis, persistent without clear etiology> better after starting antibiotics, but seems to be a robust drop that seems disproportionate to just antibiotics - con't zosyn -follow blood cultures  H/o hypertension -hold PTA meds  AKI;  stable -renally dose meds, avoid nephrotoxic meds -strict I/O -monitor -maintain adequate perfusion  Hyperglycemia, controlled -SSI PRN -goal BG 140-180  Hyperlipidemia -Con't zetia  Expected perioperative  blood loss anemia -1 unit pRBC today  Hypophosphatemia - repleted, recheck in AM    Best Practice (right click and "Reselect all SmartList Selections" daily)   Diet/type: ADAT DVT prophylaxis: SCD, heparin gtt GI prophylaxis: PPI Lines: Central line,  yes and it is still needed- vasopressors Foley:  replaced for retention Code Status:  full code Last date of multidisciplinary goals of care discussion [ ]   This patient is critically ill with multiple organ system failure which requires frequent high complexity decision making, assessment, support, evaluation, and titration of therapies. This was completed through the application of advanced monitoring technologies and extensive interpretation of multiple databases. During this encounter critical care time was devoted to patient care services described in this note for 39 minutes.  Joesph Mussel, DO 02/12/24 9:15 AM [facility]  For contact information, see Amion. If no response to pager, please call PCCM consult pager. After hours, 7PM- 7AM, please call Elink.

## 2024-02-12 NOTE — Progress Notes (Signed)
 PHARMACY - ANTICOAGULATION CONSULT NOTE  Pharmacy Consult for heparin Indication: atrial fibrillation  Allergies  Allergen Reactions   Crestor [Rosuvastatin] Other (See Comments)    Leg cramps   Glycopyrrolate     Other reaction(s): extreme dry mouth   Codeine Other (See Comments)    Hyper    Patient Measurements: Height: 5\' 4"  (162.6 cm) Weight: 76.8 kg (169 lb 5 oz) IBW/kg (Calculated) : 54.7 HEPARIN DW (KG): 68.4  Vital Signs: Temp: 97.7 F (36.5 C) (04/13 0745) Temp Source: Axillary (04/13 0745) BP: 97/53 (04/13 0700) Pulse Rate: 85 (04/13 0700)  Labs: Recent Labs    02/10/24 0355 02/11/24 0515 02/12/24 0422  HGB 7.9* 7.6* 7.1*  HCT 24.4* 23.4* 22.1*  PLT 121* 143* 112*  CREATININE 1.17* 1.18* 1.22*    Estimated Creatinine Clearance: 36.9 mL/min (A) (by C-G formula based on SCr of 1.22 mg/dL (H)).   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 Pharmacy asked to start IV heparin. Given sternotomy 4/9 will defer bolus and target lower range. Will check aPTT with initial heparin level (last apixaban dose 4/3 so likely can use heparin level).  Goal of Therapy:  Heparin level 0.3-0.5 units/ml Monitor platelets by anticoagulation protocol: Yes   Plan:  Heparin 750 units/h no bolus, titrate slowly Check heparin level and aPTT in 8h  Levin Reamer, PharmD, [place],  [facility] Clinical Pharmacist [phone] Please check AMION for all [facility] Pharmacy numbers 02/12/2024

## 2024-02-12 NOTE — Progress Notes (Signed)
 [address]       [place] [address]             [phone]      4 Days Post-Op  Procedure(s) (LRB): MITRAL VALVE REPAIR USING A SIMULUS SEMI-RIGID ANNULOPLASTY BAND (N/A) MAZE PROCEDURE (N/A) CLIPPING, LEFT ATRIAL APPENDAGE USING A MEDTRONIC CLIP (N/A) ECHOCARDIOGRAM, TRANSESOPHAGEAL (N/A)   Total Length of Stay:  LOS: 4 days    SUBJECTIVE: Nausea better  Vitals:   02/12/24 0700 02/12/24 0745  BP: (!) 97/53   Pulse: 85   Resp: 15   Temp:  97.7 F (36.5 C)  SpO2: 92%     Intake/Output      04/12 0701 04/13 0700 04/13 0701 04/14 0700   P.O. 160    I.V. (mL/kg) 827.5 (10.8)    IV Piggyback 441.8    Total Intake(mL/kg) 1429.2 (18.6)    Urine (mL/kg/hr) 1455 (0.8)    Chest Tube 0    Total Output 1455    Net -25.8             sodium chloride 10 mL/hr at 02/12/24 0700   amiodarone 30 mg/hr (02/12/24 0718)   norepinephrine (LEVOPHED) Adult infusion Stopped (02/12/24 0521)   phenylephrine (NEO-SYNEPHRINE) Adult infusion Stopped (02/09/24 1555)   piperacillin-tazobactam (ZOSYN)  IV 12.5 mL/hr at 02/12/24 0700   vasopressin 0.02 Units/min (02/12/24 0700)    CBC    Component Value Date/Time   WBC 13.5 (H) 02/12/2024 0422   RBC 2.39 (L) 02/12/2024 0422   HGB 7.1 (L) 02/12/2024 0422   HGB 11.1 (L) 12/05/2023 0803   HGB 12.8 05/28/2008 1551   HCT 22.1 (L) 02/12/2024 0422   HCT 36.6 05/28/2008 1551   PLT 112 (L) 02/12/2024 0422   PLT 174 12/05/2023 0803   PLT 164 05/28/2008 1551   MCV 92.5 02/12/2024 0422   MCV 89.3 05/28/2008 1551   MCH 29.7 02/12/2024 0422   MCHC 32.1 02/12/2024 0422   RDW 21.8 (H) 02/12/2024 0422   RDW 13.0 05/28/2008 1551   LYMPHSABS 1.5 12/05/2023 0803   LYMPHSABS 0.4 (L) 05/28/2008 1551   MONOABS 0.7 12/05/2023 0803   MONOABS 0.8 05/28/2008 1551   EOSABS 0.3 12/05/2023 0803   EOSABS 0.3 05/28/2008 1551   BASOSABS 0.1 12/05/2023 0803   BASOSABS 0.0 05/28/2008 1551   CMP     Component Value  Date/Time   NA 134 (L) 02/12/2024 0422   NA 139 12/16/2023 1638   K 4.1 02/12/2024 0422   CL 102 02/12/2024 0422   CO2 22 02/12/2024 0422   GLUCOSE 111 (H) 02/12/2024 0422   BUN 25 (H) 02/12/2024 0422   BUN 17 12/16/2023 1638   CREATININE 1.22 (H) 02/12/2024 0422   CALCIUM 6.7 (L) 02/12/2024 0422   PROT 6.1 (L) 02/07/2024 1042   PROT 6.4 04/29/2021 1102   ALBUMIN 3.8 02/07/2024 1042   ALBUMIN 4.4 04/29/2021 1102   AST 22 02/07/2024 1042   ALT 15 02/07/2024 1042   ALKPHOS 24 (L) 02/07/2024 1042   BILITOT 0.7 02/07/2024 1042   BILITOT 0.5 04/29/2021 1102   GFRNONAA 45 (L) 02/12/2024 0422   GFRAA >90 09/19/2011 0537   ABG    Component Value Date/Time   PHART 7.367 02/08/2024 1849   PCO2ART 41.3 02/08/2024 1849   PO2ART 130 (H) 02/08/2024 1849   HCO3 23.9 02/08/2024 1849   TCO2 25 02/08/2024 1849   ACIDBASEDEF 2.0 02/08/2024 1849   O2SAT 99  02/08/2024 1849   CBG (last 3)  Recent Labs    02/11/24 2336 02/12/24 0353 02/12/24 0744  GLUCAP 119* 107* 99  EXAM; Lungs; decreased bs at bases Card: rr without murmur Ext: warm Abd: soft nontender Neuro: alert   ASSESSMENT: SP MV repair/MAZE Continue to wean drips Transfuse 1 unit PRBC Start heparin WBC better  continue antibx OOB to chair   Melene Sportsman, MD 02/12/2024

## 2024-02-12 NOTE — Progress Notes (Signed)
 eLink Physician-Brief Progress Note Patient Name: [name] DOB: 01-Apr-1941 MRN: [id number]   Date of Service  02/12/2024  HPI/Events of Note  Amiodarone extravasation notified. Called by pharmacy to place orders for Hyaluronidase [place].   eICU Interventions  Order set placed. RN also to notify the primary service     Intervention Category Major Interventions: Other:  Nixie Laube G Urvi Imes 02/12/2024, 9:02 PM

## 2024-02-12 NOTE — Progress Notes (Signed)
 PHARMACY - ANTICOAGULATION CONSULT NOTE  Pharmacy Consult for heparin Indication: atrial fibrillation  Allergies  Allergen Reactions   Crestor [Rosuvastatin] Other (See Comments)    Leg cramps   Glycopyrrolate     Other reaction(s): extreme dry mouth   Codeine Other (See Comments)    Hyper    Patient Measurements: Height: 5\' 4"  (162.6 cm) Weight: 76.8 kg (169 lb 5 oz) IBW/kg (Calculated) : 54.7 HEPARIN DW (KG): 68.4  Vital Signs: Temp: 98.3 F (36.8 C) (04/13 1600) Temp Source: Oral (04/13 1600) BP: 106/63 (04/13 1700) Pulse Rate: 86 (04/13 1700)  Labs: Recent Labs    02/10/24 0355 02/11/24 0515 02/12/24 0422 02/12/24 1741  HGB 7.9* 7.6* 7.1*  --   HCT 24.4* 23.4* 22.1*  --   PLT 121* 143* 112*  --   APTT  --   --   --  128*  HEPARINUNFRC  --   --   --  0.44  CREATININE 1.17* 1.18* 1.22*  --     Estimated Creatinine Clearance: 36.9 mL/min (A) (by C-G formula based on SCr of 1.22 mg/dL (H)).   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 Pharmacy asked to start IV heparin. Given sternotomy 4/9 will defer bolus and target lower range.   -heparin level= 0.44, aPTT= 128 on 750 units/hr -last apixaban dose 4/3, aPTT is likely an error   Goal of Therapy:  Heparin level 0.3-0.5 units/ml Monitor platelets by anticoagulation protocol: Yes   Plan:  -Continue hepari nat 750  units/hr -Daily heparin level and CBC  Baxter Limber, PharmD Clinical Pharmacist **Pharmacist phone directory can now be found on [email or web address] ([email or web address]).  Listed under [facility].

## 2024-02-13 DIAGNOSIS — E876 Hypokalemia: Secondary | ICD-10-CM | POA: Diagnosis not present

## 2024-02-13 DIAGNOSIS — R339 Retention of urine, unspecified: Secondary | ICD-10-CM

## 2024-02-13 DIAGNOSIS — R112 Nausea with vomiting, unspecified: Secondary | ICD-10-CM | POA: Diagnosis not present

## 2024-02-13 DIAGNOSIS — N179 Acute kidney failure, unspecified: Secondary | ICD-10-CM

## 2024-02-13 DIAGNOSIS — I340 Nonrheumatic mitral (valve) insufficiency: Secondary | ICD-10-CM | POA: Diagnosis not present

## 2024-02-13 LAB — CBC
HCT: 25.8 % — ABNORMAL LOW (ref 36.0–46.0)
Hemoglobin: 8.5 g/dL — ABNORMAL LOW (ref 12.0–15.0)
MCH: 30.2 pg (ref 26.0–34.0)
MCHC: 32.9 g/dL (ref 30.0–36.0)
MCV: 91.8 fL (ref 80.0–100.0)
Platelets: 136 10*3/uL — ABNORMAL LOW (ref 150–400)
RBC: 2.81 MIL/uL — ABNORMAL LOW (ref 3.87–5.11)
RDW: 20.7 % — ABNORMAL HIGH (ref 11.5–15.5)
WBC: 13.7 10*3/uL — ABNORMAL HIGH (ref 4.0–10.5)
nRBC: 1 % — ABNORMAL HIGH (ref 0.0–0.2)

## 2024-02-13 LAB — BASIC METABOLIC PANEL WITH GFR
Anion gap: 12 (ref 5–15)
BUN: 24 mg/dL — ABNORMAL HIGH (ref 8–23)
CO2: 22 mmol/L (ref 22–32)
Calcium: 6.6 mg/dL — ABNORMAL LOW (ref 8.9–10.3)
Chloride: 101 mmol/L (ref 98–111)
Creatinine, Ser: 1.13 mg/dL — ABNORMAL HIGH (ref 0.44–1.00)
GFR, Estimated: 49 mL/min — ABNORMAL LOW (ref >60)
Glucose, Bld: 94 mg/dL (ref 70–99)
Potassium: 3.6 mmol/L (ref 3.5–5.1)
Sodium: 135 mmol/L (ref 135–145)

## 2024-02-13 LAB — PHOSPHORUS: Phosphorus: 2.4 mg/dL — ABNORMAL LOW (ref 2.5–4.6)

## 2024-02-13 LAB — GLUCOSE, CAPILLARY
Glucose-Capillary: 103 mg/dL — ABNORMAL HIGH (ref 70–99)
Glucose-Capillary: 109 mg/dL — ABNORMAL HIGH (ref 70–99)
Glucose-Capillary: 112 mg/dL — ABNORMAL HIGH (ref 70–99)
Glucose-Capillary: 114 mg/dL — ABNORMAL HIGH (ref 70–99)
Glucose-Capillary: 128 mg/dL — ABNORMAL HIGH (ref 70–99)
Glucose-Capillary: 87 mg/dL (ref 70–99)

## 2024-02-13 LAB — HEPARIN LEVEL (UNFRACTIONATED): Heparin Unfractionated: 0.23 [IU]/mL — ABNORMAL LOW (ref 0.30–0.70)

## 2024-02-13 MED ORDER — APIXABAN 5 MG PO TABS
5.0000 mg | ORAL_TABLET | Freq: Two times a day (BID) | ORAL | Status: DC
  Administered 2024-02-13 – 2024-03-04 (×41): 5 mg via ORAL
  Filled 2024-02-13 (×41): qty 1

## 2024-02-13 MED ORDER — AMIODARONE IV BOLUS ONLY 150 MG/100ML
150.0000 mg | Freq: Once | INTRAVENOUS | Status: AC
Start: 2024-02-13 — End: 2024-02-13
  Administered 2024-02-13: 150 mg via INTRAVENOUS
  Filled 2024-02-13: qty 100

## 2024-02-13 MED ORDER — APIXABAN 2.5 MG PO TABS: 2.5000 mg | ORAL_TABLET | Freq: Two times a day (BID) | ORAL | Status: DC

## 2024-02-13 MED ORDER — AMIODARONE IV BOLUS ONLY 150 MG/100ML
150.0000 mg | Freq: Once | INTRAVENOUS | Status: AC
  Administered 2024-02-13: 150 mg via INTRAVENOUS
  Filled 2024-02-13: qty 100

## 2024-02-13 MED ORDER — ASPIRIN 81 MG PO CHEW
81.0000 mg | CHEWABLE_TABLET | Freq: Every day | ORAL | Status: DC
  Administered 2024-02-13 – 2024-02-18 (×6): 81 mg via ORAL
  Filled 2024-02-13 (×6): qty 1

## 2024-02-13 MED ORDER — AMIODARONE HCL 200 MG PO TABS
400.0000 mg | ORAL_TABLET | Freq: Two times a day (BID) | ORAL | Status: DC
  Administered 2024-02-13 – 2024-02-14 (×4): 400 mg via ORAL
  Filled 2024-02-13 (×4): qty 2

## 2024-02-13 MED ORDER — AMIODARONE LOAD VIA INFUSION
150.0000 mg | Freq: Once | INTRAVENOUS | Status: AC
  Administered 2024-02-13: 150 mg via INTRAVENOUS

## 2024-02-13 MED ORDER — AMIODARONE LOAD VIA INFUSION
150.0000 mg | Freq: Once | INTRAVENOUS | Status: DC
Start: 2024-02-13 — End: 2024-02-13
  Filled 2024-02-13: qty 83.34

## 2024-02-13 MED ORDER — AMIODARONE IV BOLUS ONLY 150 MG/100ML: 150.0000 mg | Freq: Once | INTRAVENOUS | Status: DC

## 2024-02-13 MED ORDER — POTASSIUM CHLORIDE CRYS ER 20 MEQ PO TBCR
20.0000 meq | EXTENDED_RELEASE_TABLET | ORAL | Status: AC
  Administered 2024-02-13 (×3): 20 meq via ORAL
  Filled 2024-02-13 (×3): qty 1

## 2024-02-13 MED FILL — Potassium Chloride Inj 2 mEq/ML: INTRAVENOUS | Qty: 40 | Status: AC

## 2024-02-13 MED FILL — Lidocaine HCl Local Preservative Free (PF) Inj 2%: INTRAMUSCULAR | Qty: 14 | Status: AC

## 2024-02-13 MED FILL — Heparin Sodium (Porcine) Inj 1000 Unit/ML: Qty: 1000 | Status: AC

## 2024-02-13 MED FILL — Heparin Sodium (Porcine) Inj 1000 Unit/ML: INTRAMUSCULAR | Qty: 2500 | Status: AC

## 2024-02-13 NOTE — Progress Notes (Signed)
 Physical Therapy Treatment Patient Details Name: [name] MRN: [id number] DOB: October 15, 1941 Today's Date: 02/13/2024   History of Present Illness 83 y.o. female [facility]08[facility] for complex MVR and MAZE (via sternotomy). PMHx: anemia, HTN, mitral valve prolapse, moderate mitral regurgitation, tricuspid regurgitation    PT Comments  Patient fatigued and willing to work with PT. Requires nearly constant cues to adhere to sternal precautions (even when holding her pillow!) She requires continued education on rationale for precautions and how to move without using her arms to push or pull. Introduced use of RW with pt still drifting left and right (but less so than with Marlea Silvers walker). HR 115-124, sats 89-91% on RA.     If plan is discharge home, recommend the following: A little help with walking and/or transfers;A little help with bathing/dressing/bathroom;Assistance with cooking/housework;Assist for transportation;Help with stairs or ramp for entrance   Can travel by private vehicle        Equipment Recommendations  None recommended by PT    Recommendations for Other Services       Precautions / Restrictions Precautions Precautions: Fall;Sternal Precaution Booklet Issued: Yes (comment) Recall of Precautions/Restrictions: Impaired Precaution/Restrictions Comments: multiple cues throughout session to adhere to sternal precautions Restrictions Weight Bearing Restrictions Per Provider Order: No     Mobility  Bed Mobility Overal bed mobility: Needs Assistance Bed Mobility: Rolling, Sidelying to Sit Rolling: Min assist Sidelying to sit: Min assist, HOB elevated       General bed mobility comments: MinA to roll onto left side; min assist to move legs over EOB and Slight MinA to raise trunk. Able to shift hips foward without assist but cues to not use UEs    Transfers Overall transfer level: Needs assistance Equipment used: Rolling walker (2 wheels) Transfers: Sit to/from  Stand Sit to Stand: Min assist           General transfer comment: MinA for slight boost-up and steadying. Pt able to stand with hands on knees and momentum.    Ambulation/Gait Ambulation/Gait assistance: Min assist Gait Distance (Feet): 170 Feet Assistive device: Rolling walker (2 wheels) Gait Pattern/deviations: Step-through pattern, Decreased stride length, Drifts right/left, Trunk flexed   Gait velocity interpretation: 1.31 - 2.62 ft/sec, indicative of limited community ambulator   General Gait Details: repeated vc for upright posture and proximity to RW; tends to drift to either side  with occasional assist to redirect RW   Stairs             Wheelchair Mobility     Tilt Bed    Modified Rankin (Stroke Patients Only)       Balance Overall balance assessment: Needs assistance, Mild deficits observed, not formally tested Sitting-balance support: No upper extremity supported, Feet supported Sitting balance-Leahy Scale: Fair     Standing balance support: No upper extremity supported, During functional activity Standing balance-Leahy Scale: Fair Standing balance comment: able to stand statically with no AD                            Communication Communication Communication: Impaired Factors Affecting Communication: Hearing impaired  Cognition Arousal: Alert Behavior During Therapy: WFL for tasks assessed/performed, Flat affect   PT - Cognitive impairments: No apparent impairments                         Following commands: Intact      Cueing Cueing Techniques: Verbal cues,  Tactile cues  Exercises      General Comments General comments (skin integrity, edema, etc.): Husband present. Further education on rationale for sternal precautions.      Pertinent Vitals/Pain Pain Assessment Pain Assessment: No/denies pain    Home Living                          Prior Function            PT Goals (current goals can  now be found in the care plan section) Acute Rehab PT Goals Patient Stated Goal: to feel better Time For Goal Achievement: 02/24/24 Potential to Achieve Goals: Good Progress towards PT goals: Progressing toward goals    Frequency    Min 2X/week      PT Plan      Co-evaluation              AM-PAC PT "6 Clicks" Mobility   Outcome Measure  Help needed turning from your back to your side while in a flat bed without using bedrails?: A Little Help needed moving from lying on your back to sitting on the side of a flat bed without using bedrails?: A Little Help needed moving to and from a bed to a chair (including a wheelchair)?: A Little Help needed standing up from a chair using your arms (e.g., wheelchair or bedside chair)?: A Little Help needed to walk in hospital room?: A Little Help needed climbing 3-5 steps with a railing? : A Little 6 Click Score: 18    End of Session Equipment Utilized During Treatment: Gait belt Activity Tolerance: Patient limited by fatigue Patient left: with call bell/phone within reach;with family/visitor present;in chair Nurse Communication: Mobility status PT Visit Diagnosis: Unsteadiness on feet (R26.81);Muscle weakness (generalized) (M62.81);Other abnormalities of gait and mobility (R26.89)     Time: 1610-9604 PT Time Calculation (min) (ACUTE ONLY): 24 min  Charges:    $Gait Training: 8-22 mins $Self Care/Home Management: 8-22 PT General Charges $$ ACUTE PT VISIT: 1 Visit                      Gayle Kava, PT Acute Rehabilitation Services  Office [phone]    Guilford Leep 02/13/2024, 4:37 PM

## 2024-02-13 NOTE — Progress Notes (Signed)
 [address]       [place] [address]             [phone]      5 Days Post-Op  Procedure(s) (LRB): MITRAL VALVE REPAIR USING A SIMULUS SEMI-RIGID ANNULOPLASTY BAND (N/A) MAZE PROCEDURE (N/A) CLIPPING, LEFT ATRIAL APPENDAGE USING A MEDTRONIC CLIP (N/A) ECHOCARDIOGRAM, TRANSESOPHAGEAL (N/A)   Total Length of Stay:  LOS: 5 days    SUBJECTIVE: Walked in halls Still with nausea but feels it is better   Vitals:   02/13/24 0500 02/13/24 0600  BP: 116/61 101/60  Pulse: 85 86  Resp: 11 (!) 34  Temp:    SpO2: 92% 92%    Intake/Output      04/13 0701 04/14 0700 04/14 0701 04/15 0700   P.O. 320    I.V. (mL/kg) 738.2 (9.4)    Blood 330    IV Piggyback 340.2    Total Intake(mL/kg) 1728.5 (22)    Urine (mL/kg/hr) 850 (0.5)    Stool 0    Chest Tube     Total Output 850    Net +878.5         Stool Occurrence 1 x        amiodarone 30 mg/hr (02/13/24 9147)   heparin 900 Units/hr (02/13/24 0600)   norepinephrine (LEVOPHED) Adult infusion Stopped (02/12/24 0521)   piperacillin-tazobactam (ZOSYN)  IV 3.375 g (02/13/24 0620)   vasopressin Stopped (02/12/24 1741)    CBC    Component Value Date/Time   WBC 13.7 (H) 02/13/2024 0400   RBC 2.81 (L) 02/13/2024 0400   HGB 8.5 (L) 02/13/2024 0400   HGB 11.1 (L) 12/05/2023 0803   HGB 12.8 05/28/2008 1551   HCT 25.8 (L) 02/13/2024 0400   HCT 36.6 05/28/2008 1551   PLT 136 (L) 02/13/2024 0400   PLT 174 12/05/2023 0803   PLT 164 05/28/2008 1551   MCV 91.8 02/13/2024 0400   MCV 89.3 05/28/2008 1551   MCH 30.2 02/13/2024 0400   MCHC 32.9 02/13/2024 0400   RDW 20.7 (H) 02/13/2024 0400   RDW 13.0 05/28/2008 1551   LYMPHSABS 1.5 12/05/2023 0803   LYMPHSABS 0.4 (L) 05/28/2008 1551   MONOABS 0.7 12/05/2023 0803   MONOABS 0.8 05/28/2008 1551   EOSABS 0.3 12/05/2023 0803   EOSABS 0.3 05/28/2008 1551   BASOSABS 0.1 12/05/2023 0803   BASOSABS 0.0 05/28/2008 1551   CMP     Component Value  Date/Time   NA 135 02/13/2024 0400   NA 139 12/16/2023 1638   K 3.6 02/13/2024 0400   CL 101 02/13/2024 0400   CO2 22 02/13/2024 0400   GLUCOSE 94 02/13/2024 0400   BUN 24 (H) 02/13/2024 0400   BUN 17 12/16/2023 1638   CREATININE 1.13 (H) 02/13/2024 0400   CALCIUM 6.6 (L) 02/13/2024 0400   PROT 6.1 (L) 02/07/2024 1042   PROT 6.4 04/29/2021 1102   ALBUMIN 3.8 02/07/2024 1042   ALBUMIN 4.4 04/29/2021 1102   AST 22 02/07/2024 1042   ALT 15 02/07/2024 1042   ALKPHOS 24 (L) 02/07/2024 1042   BILITOT 0.7 02/07/2024 1042   BILITOT 0.5 04/29/2021 1102   GFRNONAA 49 (L) 02/13/2024 0400   GFRAA >90 09/19/2011 0537   ABG    Component Value Date/Time   PHART 7.367 02/08/2024 1849   PCO2ART 41.3 02/08/2024 1849   PO2ART 130 (H) 02/08/2024 1849   HCO3 23.9 02/08/2024 1849   TCO2 25 02/08/2024 1849  ACIDBASEDEF 2.0 02/08/2024 1849   O2SAT 99 02/08/2024 1849   CBG (last 3)  Recent Labs    02/12/24 2008 02/12/24 2305 02/13/24 0404  GLUCAP 113* 100* 87  EXAM Lungs; clear Card: irreg Abd: nontender Ext warm Neuro: intact   ASSESSMENT: SP MV repair Will give another bolus of amio then change to po Start elluquis Cont ambulation WBC better on antibx  continue course DC foley and CVP   Dawn Sportsman, MD 02/13/2024

## 2024-02-13 NOTE — Progress Notes (Signed)
 PHARMACY - ANTICOAGULATION CONSULT NOTE  Pharmacy Consult for heparin Indication: atrial fibrillation  Allergies  Allergen Reactions   Crestor [Rosuvastatin] Other (See Comments)    Leg cramps   Glycopyrrolate     Other reaction(s): extreme dry mouth   Codeine Other (See Comments)    Hyper    Patient Measurements: Height: 5\' 4"  (162.6 cm) Weight: 78.7 kg (173 lb 8 oz) IBW/kg (Calculated) : 54.7 HEPARIN DW (KG): 68.4  Vital Signs: Temp: 97.4 F (36.3 C) (04/14 0700) Temp Source: Oral (04/14 0700) BP: 121/73 (04/14 1100) Pulse Rate: 117 (04/14 1100)  Labs: Recent Labs    02/11/24 0515 02/12/24 0422 02/12/24 1741 02/13/24 0400  HGB 7.6* 7.1*  --  8.5*  HCT 23.4* 22.1*  --  25.8*  PLT 143* 112*  --  136*  APTT  --   --  128*  --   HEPARINUNFRC  --   --  0.44 0.23*  CREATININE 1.18* 1.22*  --  1.13*    Estimated Creatinine Clearance: 40.3 mL/min (A) (by C-G formula based on SCr of 1.13 mg/dL (H)).   Medical History: Past Medical History:  Diagnosis Date   Acid reflux    Anemia    Arthritis    Atrial fibrillation (HCC)    Cataract    Diverticulosis of colon    Endometrial cancer (HCC)    1A grade endometrial ca   Essential hypertension 01/24/2009   Qualifier: Diagnosis of  By: Nelson-Smith CMA (AAMA), Dottie     Hx of radiation therapy 04/24/08-05/29/08& 8/12/,8/19,&06/27/08   external beam  through 7/09 ,then intracavity in 06/2008   Hypertension    Mitral valve prolapse    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   Tricuspid regurgitation       Assessment: 83 yoF with hx AFib on apixaban PTA admitted for MV repair. Pharmacy asked to start IV heparin. Given sternotomy 4/9 will defer bolus and target lower range.   Was on heparin with rate increase this morning. Discussed with [facility] - plan to change from heparin to apixaban. Given age 83 but Scr <1.5 and wt>60 kg - qualifies for 5 mg BID. Patient still has pacing wires in place - will hold heparin then  pull wires 2 hours later before giving apixaban.  Goal of Therapy:  Heparin level 0.3-0.5 units/ml Monitor platelets by anticoagulation protocol: Yes   Plan:  -Stop heparin infusion and pull pacing wires 2 hours later then start apixaban 5 mg BID -Monitor CBC and s/sx of bleeding   Thank you for allowing pharmacy to participate in this patient's care,  Nieves Bars, PharmD, BCCCP Clinical Pharmacist  Phone: [phone] 02/13/2024 11:41 AM  Please check AMION for all [facility] Pharmacy phone numbers After 10:00 PM, call Main Pharmacy [phone]

## 2024-02-13 NOTE — TOC Progression Note (Signed)
 Transition of Care [facility]) - Progression Note    Patient Details  Name: [name] MRN: [id number] Date of Birth: April 05, 1941  Transition of Care [facility]) CM/SW Contact  Benjiman Bras, RN Phone Number: [phone] 02/13/2024, 2:48 PM  Clinical Narrative:     HH arranged with [facility], HH are in EPIC. Pt can get a RW if needed.    Expected Discharge Plan: Home w Home Health Services Barriers to Discharge: Continued Medical Work up  Expected Discharge Plan and Services   Discharge Planning Services: CM Consult Post Acute Care Choice: Home Health Living arrangements for the past 2 months: Single Family Home                           HH Arranged: OT, PT HH Agency: [facility] Date HH Agency Contacted: 02/12/24 Time HH Agency Contacted: 1338 Representative spoke with at [facility] Agency: [facility]   Social Determinants of Health (SDOH) Interventions SDOH Screenings   Food Insecurity: No Food Insecurity (02/08/2024)  Housing: Low Risk  (02/08/2024)  Transportation Needs: No Transportation Needs (02/08/2024)  Utilities: Not At Risk (02/08/2024)  Depression (PHQ2-9): Low Risk  (12/20/2023)  Social Connections: Socially Integrated (02/09/2024)  Tobacco Use: Low Risk  (02/08/2024)    Readmission Risk Interventions     No data to display

## 2024-02-13 NOTE — Progress Notes (Signed)
 NAME:  [name], MRN:  [id number], DOB:  1940/12/24, LOS: 5 ADMISSION DATE:  02/08/2024, CONSULTATION DATE:/07/2024 REFERRING MD: Eugenio Hoes, CHIEF COMPLAINT:/07/2024  History of Present Illness:  83 year old female with hypertension, paroxysmal A-fib who initially presented with increasing shortness of breath and bilateral leg swelling, noted to have severe mitral valve regurgitation and moderate tricuspid valve regurgitation.  Her ejection fraction was 60%, she underwent evaluation with cardiac cath which showed no coronary artery disease, today she underwent mitral valve repair with maze procedure.  Patient remained intubated and was transferred to ICU postop.  PCCM was consulted for help evaluation medical management  Pertinent  Medical History   Past Medical History:  Diagnosis Date   Acid reflux    Anemia    Arthritis    Atrial fibrillation (HCC)    Cataract    Diverticulosis of colon    Endometrial cancer (HCC)    1A grade endometrial ca   Essential hypertension 01/24/2009   Qualifier: Diagnosis of  By: Nelson-Smith CMA (AAMA), Dottie     Hx of radiation therapy 04/24/08-05/29/08& 8/12/,8/19,&06/27/08   external beam  through 7/09 ,then intracavity in 06/2008   Hypertension    Mitral valve prolapse    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   Tricuspid regurgitation      Significant Hospital Events: Including procedures, antibiotic start and stop dates in addition to other pertinent events   4/9 Mitral valve repair s/p Maze procedure, extubated 4/10 Afib RVR on amio gtt, requiring pressors  4/11 A-fib controlled with Amio drip, weaning off pressors, still having some nausea secondary to vasoplegia but improving, up to chair, plan to walk later-overall progressing 4/14 changing amio to PO after bolus, changing to PO eliquis, introducer wires and foley to dc   Interim History / Subjective:  Amio extrav overnight   K this morning 3.6  phos 2.4  Cr down to 1.13  WBC 13.7  h/h 8.5/25.8 plt 136  Feels worn out. Sitting in bedside recliner   Objective   Blood pressure 101/60, pulse 86, temperature (!) 97.4 F (36.3 C), temperature source Oral, resp. rate (!) 34, height 5\' 4"  (1.626 m), weight 78.7 kg, SpO2 92%.        Intake/Output Summary (Last 24 hours) at 02/13/2024 0800 Last data filed at 02/13/2024 0600 Gross per 24 hour  Intake 1564.31 ml  Output 850 ml  Net 714.31 ml   Filed Weights   02/11/24 0500 02/12/24 0500 02/13/24 0600  Weight: 75.5 kg 76.8 kg 78.7 kg   Examination: General: Chronically and acutely ill elderly F NAD  HEENT:  NCAT pink mm  Cardiovascular: irir cap refill < 3 sec. Pacing wires Pulmonary: even unlabored, symmetrical chest expansion  Abdomen: soft ndnt  Skin: c/d/w. Surgical incision is well approximated  Extremities: BLE edema  GU: clear yellow urine, foley   Resolved Hospital Problem list   Acute respiratory insufficiency, postop-extubated on 4/9   Assessment & Plan:   Severe MV regurg s/p MV repair Mild TV regurg pAF s/p MAZE, closure of LA appendage  Post-op vasoplegia  P -amio bolus then PO amio -pacing wires out then convert to eliquis  -ASA, statin  -multimodal analgesia  -mobility   N/v  Hypokalemia Hypophosphatemia  P -scop patch, compazine, PRN zofran -qtc monitoring  -replace lytes PRN   AKI Urinary retention  P -will trial foley dc.  Scop patch may complicate retention issues   Leukocytosis, improving -unclear etiology, is improving w abx so will continue but  no clear infectious source of yet P -zosyn -follow bcx, WBC and fever curve   Hx HTN HLD -holding home antihypertensives for now -zetia    Best Practice (right click and "Reselect all SmartList Selections" daily)   Diet/type: advance as tolerated  DVT prophylaxis: SCD, heparin gtt GI prophylaxis: PPI Lines: Central line to dc 4/14  Foley:  to dc 4/14  Code Status:  full code Last date of multidisciplinary goals of  care discussion [per primary  ]   Mod MDM   Eston Hence MSN, AGACNP-BC [facility] Pulmonary/Critical Care Medicine Amion for pager  02/13/2024, 11:48 AM

## 2024-02-13 NOTE — Progress Notes (Signed)
 PHARMACY - ANTICOAGULATION CONSULT NOTE  Pharmacy Consult for heparin Indication: atrial fibrillation  Labs: Recent Labs    02/11/24 0515 02/12/24 0422 02/12/24 1741 02/13/24 0400  HGB 7.6* 7.1*  --  8.5*  HCT 23.4* 22.1*  --  25.8*  PLT 143* 112*  --  136*  APTT  --   --  128*  --   HEPARINUNFRC  --   --  0.44 0.23*  CREATININE 1.18* 1.22*  --  1.13*   Assessment: 82yo female subtherapeutic on heparin after one level at goal; no infusion issues or signs of bleeding per RN.  Goal of Therapy:  Heparin level 0.3-0.5 units/ml   Plan:  Increase heparin infusion by 2 units/kg/hr to 900 units/hr. Check level in 8 hours.   Lonnie Roberts, PharmD, BCPS 02/13/2024 5:58 AM

## 2024-02-13 NOTE — Progress Notes (Signed)
      [address]       [place] [address]             [phone]      POD #  5 MV repair, Maze, LAA clip  Up in chair BP 109/67   Pulse (!) 109   Temp 97.8 F (36.6 C) (Oral)   Resp (!) 29   Ht 5\' 4"  (1.626 m)   Wt 78.7 kg   SpO2 92%   BMI 29.78 kg/m  RA 91% Amiodarone bolus x 2 today   Intake/Output Summary (Last 24 hours) at 02/13/2024 1918 Last data filed at 02/13/2024 1900 Gross per 24 hour  Intake 1030.26 ml  Output 990 ml  Net 40.26 ml   Continue current care  Annie Roseboom C. Luna Salinas, MD [facility] [phone]

## 2024-02-14 ENCOUNTER — Other Ambulatory Visit (HOSPITAL_COMMUNITY)

## 2024-02-14 ENCOUNTER — Inpatient Hospital Stay (HOSPITAL_COMMUNITY)

## 2024-02-14 DIAGNOSIS — Z954 Presence of other heart-valve replacement: Secondary | ICD-10-CM

## 2024-02-14 DIAGNOSIS — R112 Nausea with vomiting, unspecified: Secondary | ICD-10-CM | POA: Diagnosis not present

## 2024-02-14 DIAGNOSIS — I340 Nonrheumatic mitral (valve) insufficiency: Secondary | ICD-10-CM | POA: Diagnosis not present

## 2024-02-14 DIAGNOSIS — E876 Hypokalemia: Secondary | ICD-10-CM | POA: Diagnosis not present

## 2024-02-14 LAB — ECHOCARDIOGRAM COMPLETE
AR max vel: 1.46 cm2
AV Area VTI: 1.66 cm2
AV Area mean vel: 1.38 cm2
AV Mean grad: 4 mmHg
AV Peak grad: 6.9 mmHg
Ao pk vel: 1.31 m/s
Area-P 1/2: 5.7 cm2
Height: 64 in
MV VTI: 1.2 cm2
S' Lateral: 3.3 cm
Weight: 2790.14 [oz_av]

## 2024-02-14 LAB — GLUCOSE, CAPILLARY
Glucose-Capillary: 104 mg/dL — ABNORMAL HIGH (ref 70–99)
Glucose-Capillary: 150 mg/dL — ABNORMAL HIGH (ref 70–99)
Glucose-Capillary: 115 mg/dL — ABNORMAL HIGH (ref 70–99)
Glucose-Capillary: 106 mg/dL — ABNORMAL HIGH (ref 70–99)
Glucose-Capillary: 124 mg/dL — ABNORMAL HIGH (ref 70–99)
Glucose-Capillary: 108 mg/dL — ABNORMAL HIGH (ref 70–99)

## 2024-02-14 LAB — BASIC METABOLIC PANEL WITH GFR
Anion gap: 11 (ref 5–15)
BUN: 20 mg/dL (ref 8–23)
CO2: 22 mmol/L (ref 22–32)
Calcium: 6.8 mg/dL — ABNORMAL LOW (ref 8.9–10.3)
Chloride: 100 mmol/L (ref 98–111)
Creatinine, Ser: 1.15 mg/dL — ABNORMAL HIGH (ref 0.44–1.00)
GFR, Estimated: 48 mL/min — ABNORMAL LOW (ref >60)
Glucose, Bld: 114 mg/dL — ABNORMAL HIGH (ref 70–99)
Potassium: 3.8 mmol/L (ref 3.5–5.1)
Sodium: 133 mmol/L — ABNORMAL LOW (ref 135–145)

## 2024-02-14 LAB — CBC
HCT: 27.2 % — ABNORMAL LOW (ref 36.0–46.0)
Hemoglobin: 9 g/dL — ABNORMAL LOW (ref 12.0–15.0)
MCH: 30.1 pg (ref 26.0–34.0)
MCHC: 33.1 g/dL (ref 30.0–36.0)
MCV: 91 fL (ref 80.0–100.0)
Platelets: 181 10*3/uL (ref 150–400)
RBC: 2.99 MIL/uL — ABNORMAL LOW (ref 3.87–5.11)
RDW: 20.6 % — ABNORMAL HIGH (ref 11.5–15.5)
WBC: 13.8 10*3/uL — ABNORMAL HIGH (ref 4.0–10.5)
nRBC: 1.1 % — ABNORMAL HIGH (ref 0.0–0.2)

## 2024-02-14 MED ORDER — AMIODARONE IV BOLUS ONLY 150 MG/100ML
150.0000 mg | Freq: Once | INTRAVENOUS | Status: AC
  Administered 2024-02-14: 150 mg via INTRAVENOUS
  Filled 2024-02-14: qty 100

## 2024-02-14 MED ORDER — POTASSIUM CHLORIDE CRYS ER 20 MEQ PO TBCR
20.0000 meq | EXTENDED_RELEASE_TABLET | Freq: Once | ORAL | Status: AC
  Administered 2024-02-14: 20 meq via ORAL
  Filled 2024-02-14: qty 1

## 2024-02-14 MED ORDER — FUROSEMIDE 10 MG/ML IJ SOLN
40.0000 mg | Freq: Two times a day (BID) | INTRAMUSCULAR | Status: DC
  Administered 2024-02-14 – 2024-02-15 (×3): 40 mg via INTRAVENOUS
  Filled 2024-02-14 (×3): qty 4

## 2024-02-14 MED ORDER — MAGNESIUM SULFATE 2 GM/50ML IV SOLN
2.0000 g | Freq: Once | INTRAVENOUS | Status: AC
  Administered 2024-02-14: 2 g via INTRAVENOUS
  Filled 2024-02-14: qty 50

## 2024-02-14 NOTE — Progress Notes (Signed)
 [address]       [place] [address]             [phone]      6 Days Post-Op  Procedure(s) (LRB): MITRAL VALVE REPAIR USING A SIMULUS SEMI-RIGID ANNULOPLASTY BAND (N/A) MAZE PROCEDURE (N/A) CLIPPING, LEFT ATRIAL APPENDAGE USING A MEDTRONIC CLIP (N/A) ECHOCARDIOGRAM, TRANSESOPHAGEAL (N/A)   Total Length of Stay:  LOS: 6 days    SUBJECTIVE: When ambulates gets SOB Nausea improving  Vitals:   02/14/24 0400 02/14/24 0500  BP: 113/80 (!) 93/57  Pulse: (!) 125 (!) 132  Resp: 17 15  Temp:    SpO2: 92% 90%    Intake/Output      04/14 0701 04/15 0700 04/15 0701 04/16 0700   P.O.     I.V. (mL/kg) 380.7 (4.8)    Blood     IV Piggyback 149.9    Total Intake(mL/kg) 530.6 (6.7)    Urine (mL/kg/hr) 775 (0.4)    Stool 0    Total Output 775    Net -244.4         Stool Occurrence 1 x        piperacillin-tazobactam (ZOSYN)  IV 3.375 g (02/14/24 0625)    CBC    Component Value Date/Time   WBC 13.8 (H) 02/14/2024 0316   RBC 2.99 (L) 02/14/2024 0316   HGB 9.0 (L) 02/14/2024 0316   HGB 11.1 (L) 12/05/2023 0803   HGB 12.8 05/28/2008 1551   HCT 27.2 (L) 02/14/2024 0316   HCT 36.6 05/28/2008 1551   PLT 181 02/14/2024 0316   PLT 174 12/05/2023 0803   PLT 164 05/28/2008 1551   MCV 91.0 02/14/2024 0316   MCV 89.3 05/28/2008 1551   MCH 30.1 02/14/2024 0316   MCHC 33.1 02/14/2024 0316   RDW 20.6 (H) 02/14/2024 0316   RDW 13.0 05/28/2008 1551   LYMPHSABS 1.5 12/05/2023 0803   LYMPHSABS 0.4 (L) 05/28/2008 1551   MONOABS 0.7 12/05/2023 0803   MONOABS 0.8 05/28/2008 1551   EOSABS 0.3 12/05/2023 0803   EOSABS 0.3 05/28/2008 1551   BASOSABS 0.1 12/05/2023 0803   BASOSABS 0.0 05/28/2008 1551   CMP     Component Value Date/Time   NA 135 02/13/2024 0400   NA 139 12/16/2023 1638   K 3.6 02/13/2024 0400   CL 101 02/13/2024 0400   CO2 22 02/13/2024 0400   GLUCOSE 94 02/13/2024 0400   BUN 24 (H) 02/13/2024 0400   BUN 17 12/16/2023  1638   CREATININE 1.13 (H) 02/13/2024 0400   CALCIUM 6.6 (L) 02/13/2024 0400   PROT 6.1 (L) 02/07/2024 1042   PROT 6.4 04/29/2021 1102   ALBUMIN 3.8 02/07/2024 1042   ALBUMIN 4.4 04/29/2021 1102   AST 22 02/07/2024 1042   ALT 15 02/07/2024 1042   ALKPHOS 24 (L) 02/07/2024 1042   BILITOT 0.7 02/07/2024 1042   BILITOT 0.5 04/29/2021 1102   GFRNONAA 49 (L) 02/13/2024 0400   GFRAA >90 09/19/2011 0537   ABG    Component Value Date/Time   PHART 7.367 02/08/2024 1849   PCO2ART 41.3 02/08/2024 1849   PO2ART 130 (H) 02/08/2024 1849   HCO3 23.9 02/08/2024 1849   TCO2 25 02/08/2024 1849   ACIDBASEDEF 2.0 02/08/2024 1849   O2SAT 99 02/08/2024 1849   CBG (last 3)  Recent Labs    02/13/24 1926 02/13/24 2342 02/14/24 0315  GLUCAP 112* 109* 104*  EXAM Lungs; overall clear Card: tachy,  no murmurs Ext; edematous Neuro: intact   ASSESSMENT: SP MV repair MAZE Continues with afib, on oral amio but will continue IV boluses for now until chemically converts ECHO today Advance diet Diuretics   Dawn Sportsman, MD 02/14/2024

## 2024-02-14 NOTE — Progress Notes (Signed)
 NAME:  [name], MRN:  [id number], DOB:  Mar 29, 1941, LOS: 6 ADMISSION DATE:  02/08/2024, CONSULTATION DATE:/07/2024 REFERRING MD: Eugenio Hoes, CHIEF COMPLAINT:/07/2024  History of Present Illness:  83 year old female with hypertension, paroxysmal A-fib who initially presented with increasing shortness of breath and bilateral leg swelling, noted to have severe mitral valve regurgitation and moderate tricuspid valve regurgitation.  Her ejection fraction was 60%, she underwent evaluation with cardiac cath which showed no coronary artery disease, today she underwent mitral valve repair with maze procedure.  Patient remained intubated and was transferred to ICU postop.  PCCM was consulted for help evaluation medical management  Pertinent  Medical History   Past Medical History:  Diagnosis Date   Acid reflux    Anemia    Arthritis    Atrial fibrillation (HCC)    Cataract    Diverticulosis of colon    Endometrial cancer (HCC)    1A grade endometrial ca   Essential hypertension 01/24/2009   Qualifier: Diagnosis of  By: Nelson-Smith CMA (AAMA), Dottie     Hx of radiation therapy 04/24/08-05/29/08& 8/12/,8/19,&06/27/08   external beam  through 7/09 ,then intracavity in 06/2008   Hypertension    Mitral valve prolapse    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   Tricuspid regurgitation      Significant Hospital Events: Including procedures, antibiotic start and stop dates in addition to other pertinent events   4/9 Mitral valve repair s/p Maze procedure, extubated 4/10 Afib RVR on amio gtt, requiring pressors  4/11 A-fib controlled with Amio drip, weaning off pressors, still having some nausea secondary to vasoplegia but improving, up to chair, plan to walk later-overall progressing 4/14 changing amio to PO after bolus, changing to PO eliquis, introducer wires and foley to dc   Interim History / Subjective:   NAEO  Afib still today  \Foley out this morning   Objective   Blood  pressure (!) 93/57, pulse (!) 132, temperature 98.1 F (36.7 C), temperature source Oral, resp. rate 15, height 5\' 4"  (1.626 m), weight 79.1 kg, SpO2 90%.        Intake/Output Summary (Last 24 hours) at 02/14/2024 0720 Last data filed at 02/14/2024 0600 Gross per 24 hour  Intake 530.6 ml  Output 775 ml  Net -244.4 ml   Filed Weights   02/12/24 0500 02/13/24 0600 02/14/24 0600  Weight: 76.8 kg 78.7 kg 79.1 kg   Examination: General: very pleasant elderly F NAD  HEENT:  NCAT pink mm  Cardiovascular: irir cap refill brisk  Pulmonary: even unlabored symmetrical chest expansion  Abdomen: ndnt  Skin: cdw Extremities: no acute joint deformity. Extremity edema  GU: no foley   Resolved Hospital Problem list   AcuPost-op vasoplegia te respiratory insufficiency, postop-extubated on 4/9   Assessment & Plan:    Severe MV s/p repair Afib s/p MAZE  P -cont PO amio + PRN IV bolus -eliquis  -ASA, statin  -multimodal analgesia  -mobility  -ECHO  -lasix 4/15  N/v  Hypokalemia Hypophosphatemia  P -check labs 4/15 -- replace lytes as needed -scop patch + PRN zofran.  If difficulty voiding, can look at dc scop   AKI Urinary retention  P -foley out 4/15.  Scop patch may complicate retention issues  -BMP pending 4/15   Leukocytosis, improving -unclear etiology, is improving w abx so will continue but no clear infectious source of yet P -zosyn x 5d  -follow bcx, WBC and fever curve   Hx HTN HLD -holding home antihypertensives for  now, starting lasix 4/15  -zetia    Best Practice (right click and "Reselect all SmartList Selections" daily)   Diet/type: advance as tolerated  DVT prophylaxis: dliquis  GI prophylaxis: PPI Lines: na  Foley: dc 4/15  Code Status:  full code Last date of multidisciplinary goals of care discussion [per primary  ]   Mod MDM   Eston Hence MSN, AGACNP-BC [place] Pulmonary/Critical Care Medicine Amion for pager  02/14/2024, 7:20 AM

## 2024-02-14 NOTE — Progress Notes (Signed)
 Occupational Therapy Treatment Patient Details Name: [name] MRN: [id number] DOB: 1941-02-10 Today's Date: 02/14/2024   History of present illness 83 y.o. female presents to [facility] 02/08/24 for complex MVR and MAZE (via sternotomy). PMHx: anemia, HTN, mitral valve prolapse, moderate mitral regurgitation, tricuspid regurgitation   OT comments  Patient participating with self care this date, setup and Min A for upper body and Mod A for lower body.  Increased SOB and fatigue noted.  Patient with poor dynamic balance, losing balance backwards multiple times with assist to maintain.  OT will continue efforts in the acute setting with [facility] arranged for post acute rehab.        If plan is discharge home, recommend the following:  Two people to help with walking and/or transfers;Two people to help with bathing/dressing/bathroom;Assistance with cooking/housework;Assist for transportation;Help with stairs or ramp for entrance   Equipment Recommendations  BSC/3in1    Recommendations for Other Services      Precautions / Restrictions Precautions Precautions: Fall;Sternal Recall of Precautions/Restrictions: Impaired Precaution/Restrictions Comments: multiple cues throughout session to adhere to sternal precautions Restrictions Weight Bearing Restrictions Per Provider Order: Yes Other Position/Activity Restrictions: sternal       Mobility Bed Mobility Overal bed mobility: Needs Assistance Bed Mobility: Sit to Supine       Sit to supine: Min assist        Transfers Overall transfer level: Needs assistance Equipment used: Rolling walker (2 wheels) Transfers: Sit to/from Stand Sit to Stand: Min assist                 Balance Overall balance assessment: Needs assistance Sitting-balance support: No upper extremity supported, Feet supported Sitting balance-Leahy Scale: Fair     Standing balance support: Reliant on assistive device for balance Standing balance-Leahy Scale:  Poor                             ADL either performed or assessed with clinical judgement   ADL       Grooming: Contact guard assist;Cueing for compensatory techniques;Sitting   Upper Body Bathing: Minimal assistance;Cueing for compensatory techniques;Sitting   Lower Body Bathing: Moderate assistance;Maximal assistance;+2 for safety/equipment;Sitting/lateral leans;Sit to/from stand;Cueing for compensatory techniques   Upper Body Dressing : Minimal assistance;Cueing for compensatory techniques;Sitting   Lower Body Dressing: Moderate assistance;Maximal assistance;Cueing for compensatory techniques;Sitting/lateral leans;Sit to/from stand;+2 for safety/equipment                      Extremity/Trunk Assessment Upper Extremity Assessment Upper Extremity Assessment: Overall WFL for tasks assessed RUE Deficits / Details: edematous throughout UE RUE Sensation: WNL RUE Coordination: WNL LUE Deficits / Details: edematous throughout UE LUE Sensation: WNL LUE Coordination: WNL   Lower Extremity Assessment Lower Extremity Assessment: Defer to PT evaluation   Cervical / Trunk Assessment Cervical / Trunk Assessment: Other exceptions;Normal    Vision Patient Visual Report: No change from baseline     Perception Perception Perception: Not tested   Praxis Praxis Praxis: Not tested   Communication Communication Communication: Impaired Factors Affecting Communication: Hearing impaired   Cognition Arousal: Alert Behavior During Therapy: WFL for tasks assessed/performed, Flat affect Cognition: No apparent impairments                               Following commands: Intact  Pertinent Vitals/ Pain       Pain Assessment Pain Assessment: Faces Faces Pain Scale: Hurts a little bit Pain Location: incision Pain Descriptors / Indicators: Aching Pain Intervention(s): Monitored during session                                                           Frequency  Min 2X/week        Progress Toward Goals  OT Goals(current goals can now be found in the care plan section)  Progress towards OT goals: Progressing toward goals  Acute Rehab OT Goals OT Goal Formulation: With patient Time For Goal Achievement: 02/24/24 Potential to Achieve Goals: Good  Plan      Co-evaluation                 AM-PAC OT "6 Clicks" Daily Activity     Outcome Measure   Help from another person eating meals?: A Little Help from another person taking care of personal grooming?: A Little Help from another person toileting, which includes using toliet, bedpan, or urinal?: A Lot Help from another person bathing (including washing, rinsing, drying)?: A Lot Help from another person to put on and taking off regular upper body clothing?: A Little Help from another person to put on and taking off regular lower body clothing?: A Lot 6 Click Score: 15    End of Session Equipment Utilized During Treatment: Oxygen  OT Visit Diagnosis: Other abnormalities of gait and mobility (R26.89);Muscle weakness (generalized) (M62.81);Other (comment)   Activity Tolerance Patient tolerated treatment well;Treatment limited secondary to medical complications (Comment)   Patient Left in bed;with call bell/phone within reach;with family/visitor present   Nurse Communication Mobility status;Other (comment)        Time: 9528-4132 OT Time Calculation (min): 22 min  Charges: OT General Charges $OT Visit: 1 Visit OT Treatments $Self Care/Home Management : 8-22 mins  02/14/2024  RP, OTR/L  Acute Rehabilitation Services  Office:  [phone]   Benjamen Brand 02/14/2024, 11:34 AM

## 2024-02-15 ENCOUNTER — Inpatient Hospital Stay (HOSPITAL_COMMUNITY)

## 2024-02-15 ENCOUNTER — Other Ambulatory Visit: Payer: Self-pay

## 2024-02-15 DIAGNOSIS — I10 Essential (primary) hypertension: Secondary | ICD-10-CM | POA: Diagnosis not present

## 2024-02-15 DIAGNOSIS — R112 Nausea with vomiting, unspecified: Secondary | ICD-10-CM | POA: Diagnosis not present

## 2024-02-15 DIAGNOSIS — I5021 Acute systolic (congestive) heart failure: Secondary | ICD-10-CM

## 2024-02-15 DIAGNOSIS — I340 Nonrheumatic mitral (valve) insufficiency: Secondary | ICD-10-CM | POA: Diagnosis not present

## 2024-02-15 DIAGNOSIS — I480 Paroxysmal atrial fibrillation: Secondary | ICD-10-CM | POA: Diagnosis not present

## 2024-02-15 DIAGNOSIS — E876 Hypokalemia: Secondary | ICD-10-CM | POA: Diagnosis not present

## 2024-02-15 LAB — GLUCOSE, CAPILLARY
Glucose-Capillary: 112 mg/dL — ABNORMAL HIGH (ref 70–99)
Glucose-Capillary: 112 mg/dL — ABNORMAL HIGH (ref 70–99)
Glucose-Capillary: 120 mg/dL — ABNORMAL HIGH (ref 70–99)
Glucose-Capillary: 122 mg/dL — ABNORMAL HIGH (ref 70–99)
Glucose-Capillary: 110 mg/dL — ABNORMAL HIGH (ref 70–99)
Glucose-Capillary: 128 mg/dL — ABNORMAL HIGH (ref 70–99)

## 2024-02-15 LAB — COOXEMETRY PANEL
Carboxyhemoglobin: 1.6 % — ABNORMAL HIGH (ref 0.5–1.5)
Carboxyhemoglobin: 1.9 % — ABNORMAL HIGH (ref 0.5–1.5)
Methemoglobin: <0.7 % (ref 0.0–1.5)
Methemoglobin: <0.7 % (ref 0.0–1.5)
O2 Saturation: 54 %
O2 Saturation: 60.9 %
Total hemoglobin: 10.1 g/dL — ABNORMAL LOW (ref 12.0–16.0)
Total hemoglobin: 9.2 g/dL — ABNORMAL LOW (ref 12.0–16.0)

## 2024-02-15 LAB — BASIC METABOLIC PANEL WITH GFR
Anion gap: 13 (ref 5–15)
Anion gap: 13 (ref 5–15)
BUN: 24 mg/dL — ABNORMAL HIGH (ref 8–23)
BUN: 24 mg/dL — ABNORMAL HIGH (ref 8–23)
CO2: 21 mmol/L — ABNORMAL LOW (ref 22–32)
CO2: 24 mmol/L (ref 22–32)
Calcium: 6.7 mg/dL — ABNORMAL LOW (ref 8.9–10.3)
Calcium: 7.1 mg/dL — ABNORMAL LOW (ref 8.9–10.3)
Chloride: 98 mmol/L (ref 98–111)
Chloride: 92 mmol/L — ABNORMAL LOW (ref 98–111)
Creatinine, Ser: 1.29 mg/dL — ABNORMAL HIGH (ref 0.44–1.00)
Creatinine, Ser: 1.28 mg/dL — ABNORMAL HIGH (ref 0.44–1.00)
GFR, Estimated: 42 mL/min — ABNORMAL LOW (ref >60)
GFR, Estimated: 42 mL/min — ABNORMAL LOW (ref >60)
Glucose, Bld: 106 mg/dL — ABNORMAL HIGH (ref 70–99)
Glucose, Bld: 203 mg/dL — ABNORMAL HIGH (ref 70–99)
Potassium: 4 mmol/L (ref 3.5–5.1)
Potassium: 2.9 mmol/L — ABNORMAL LOW (ref 3.5–5.1)
Sodium: 132 mmol/L — ABNORMAL LOW (ref 135–145)
Sodium: 129 mmol/L — ABNORMAL LOW (ref 135–145)

## 2024-02-15 LAB — CBC
HCT: 27.3 % — ABNORMAL LOW (ref 36.0–46.0)
Hemoglobin: 9 g/dL — ABNORMAL LOW (ref 12.0–15.0)
MCH: 30.1 pg (ref 26.0–34.0)
MCHC: 33 g/dL (ref 30.0–36.0)
MCV: 91.3 fL (ref 80.0–100.0)
Platelets: 242 10*3/uL (ref 150–400)
RBC: 2.99 MIL/uL — ABNORMAL LOW (ref 3.87–5.11)
RDW: 20.4 % — ABNORMAL HIGH (ref 11.5–15.5)
WBC: 14.5 10*3/uL — ABNORMAL HIGH (ref 4.0–10.5)
nRBC: 1.2 % — ABNORMAL HIGH (ref 0.0–0.2)

## 2024-02-15 LAB — MAGNESIUM: Magnesium: 2.9 mg/dL — ABNORMAL HIGH (ref 1.7–2.4)

## 2024-02-15 MED ORDER — FUROSEMIDE 10 MG/ML IJ SOLN
80.0000 mg | Freq: Once | INTRAMUSCULAR | Status: AC
  Administered 2024-02-15: 80 mg via INTRAVENOUS
  Filled 2024-02-15: qty 8

## 2024-02-15 MED ORDER — AMIODARONE IV BOLUS ONLY 150 MG/100ML
150.0000 mg | Freq: Once | INTRAVENOUS | Status: AC
  Administered 2024-02-15: 150 mg via INTRAVENOUS
  Filled 2024-02-15: qty 100

## 2024-02-15 MED ORDER — CALCIUM GLUCONATE-NACL 2-0.675 GM/100ML-% IV SOLN
2.0000 g | Freq: Once | INTRAVENOUS | Status: AC
  Administered 2024-02-15: 2000 mg via INTRAVENOUS
  Filled 2024-02-15: qty 100

## 2024-02-15 MED ORDER — FUROSEMIDE 10 MG/ML IJ SOLN
12.0000 mg/h | INTRAVENOUS | Status: DC
  Administered 2024-02-15 – 2024-02-17 (×3): 8 mg/h via INTRAVENOUS
  Administered 2024-02-18 – 2024-02-19 (×3): 12 mg/h via INTRAVENOUS
  Filled 2024-02-15 (×6): qty 20

## 2024-02-15 MED ORDER — AMIODARONE HCL 200 MG PO TABS
400.0000 mg | ORAL_TABLET | Freq: Two times a day (BID) | ORAL | Status: DC
  Administered 2024-02-15: 400 mg via ORAL
  Filled 2024-02-15: qty 2

## 2024-02-15 MED ORDER — POTASSIUM CHLORIDE 10 MEQ/50ML IV SOLN
10.0000 meq | INTRAVENOUS | Status: DC
Start: 2024-02-15 — End: 2024-02-15

## 2024-02-15 MED ORDER — AMIODARONE HCL IN DEXTROSE 360-4.14 MG/200ML-% IV SOLN
60.0000 mg/h | INTRAVENOUS | Status: DC
  Administered 2024-02-15 – 2024-02-16 (×2): 30 mg/h via INTRAVENOUS
  Administered 2024-02-16 (×2): 60 mg/h via INTRAVENOUS
  Administered 2024-02-16: 30 mg/h via INTRAVENOUS
  Administered 2024-02-17 – 2024-02-21 (×17): 60 mg/h via INTRAVENOUS
  Filled 2024-02-15 (×5): qty 200
  Filled 2024-02-15: qty 400
  Filled 2024-02-15 (×14): qty 200

## 2024-02-15 MED ORDER — POTASSIUM CHLORIDE 10 MEQ/50ML IV SOLN
10.0000 meq | INTRAVENOUS | Status: AC
  Administered 2024-02-15 – 2024-02-16 (×6): 10 meq via INTRAVENOUS
  Filled 2024-02-15 (×6): qty 50

## 2024-02-15 MED ORDER — MILRINONE LACTATE IN DEXTROSE 20-5 MG/100ML-% IV SOLN
0.1250 ug/kg/min | INTRAVENOUS | Status: DC
  Administered 2024-02-15 – 2024-02-20 (×5): 0.125 ug/kg/min via INTRAVENOUS
  Filled 2024-02-15 (×4): qty 100

## 2024-02-15 MED ORDER — METOLAZONE 5 MG PO TABS
5.0000 mg | ORAL_TABLET | Freq: Once | ORAL | Status: AC
  Administered 2024-02-15: 5 mg via ORAL
  Filled 2024-02-15: qty 1

## 2024-02-15 MED ORDER — SODIUM CHLORIDE 0.9% FLUSH: 10.0000 mL | INTRAVENOUS | Status: DC | PRN

## 2024-02-15 MED ORDER — AMIODARONE IV BOLUS ONLY 150 MG/100ML: 150.0000 mg | Freq: Once | INTRAVENOUS | Status: DC

## 2024-02-15 MED ORDER — AMIODARONE HCL IN DEXTROSE 360-4.14 MG/200ML-% IV SOLN
60.0000 mg/h | INTRAVENOUS | Status: AC
  Administered 2024-02-15 (×2): 60 mg/h via INTRAVENOUS
  Filled 2024-02-15: qty 200

## 2024-02-15 MED ORDER — PHENOL 1.4 % MT LIQD
1.0000 | OROMUCOSAL | Status: DC | PRN
  Filled 2024-02-15: qty 177

## 2024-02-15 MED ORDER — AMIODARONE LOAD VIA INFUSION
150.0000 mg | Freq: Once | INTRAVENOUS | Status: AC
  Administered 2024-02-15: 150 mg via INTRAVENOUS
  Filled 2024-02-15: qty 83.34

## 2024-02-15 MED ORDER — SODIUM CHLORIDE 0.9% FLUSH
10.0000 mL | Freq: Two times a day (BID) | INTRAVENOUS | Status: DC
  Administered 2024-02-16: 10 mL
  Administered 2024-02-16: 30 mL
  Administered 2024-02-17: 40 mL
  Administered 2024-02-17: 10 mL

## 2024-02-15 MED ORDER — BETHANECHOL CHLORIDE 5 MG PO TABS
5.0000 mg | ORAL_TABLET | Freq: Three times a day (TID) | ORAL | Status: DC
  Administered 2024-02-15 – 2024-02-16 (×6): 5 mg via ORAL
  Filled 2024-02-15 (×7): qty 1

## 2024-02-15 MED ORDER — POTASSIUM CHLORIDE CRYS ER 20 MEQ PO TBCR
40.0000 meq | EXTENDED_RELEASE_TABLET | Freq: Once | ORAL | Status: AC
  Administered 2024-02-15: 40 meq via ORAL
  Filled 2024-02-15: qty 2

## 2024-02-15 NOTE — Progress Notes (Signed)
 NAME:  [name], MRN:  [id number], DOB:  [birth year], LOS: 7 ADMISSION DATE:  02/08/2024, CONSULTATION DATE:/07/2024 REFERRING MD: Eugenio Hoes, CHIEF COMPLAINT:/07/2024  History of Present Illness:  83 year old female with hypertension, paroxysmal A-fib who initially presented with increasing shortness of breath and bilateral leg swelling, noted to have severe mitral valve regurgitation and moderate tricuspid valve regurgitation.  Her ejection fraction was 60%, she underwent evaluation with cardiac cath which showed no coronary artery disease, today she underwent mitral valve repair with maze procedure.  Patient remained intubated and was transferred to ICU postop.  PCCM was consulted for help evaluation medical management  Pertinent  Medical History   Past Medical History:  Diagnosis Date   Acid reflux    Anemia    Arthritis    Atrial fibrillation (HCC)    Cataract    Diverticulosis of colon    Endometrial cancer (HCC)    1A grade endometrial ca   Essential hypertension 01/24/2009   Qualifier: Diagnosis of  By: Nelson-Smith CMA (AAMA), Dottie     Hx of radiation therapy 04/24/08-05/29/08& 8/12/,8/19,&06/27/08   external beam  through 7/09 ,then intracavity in 06/2008   Hypertension    Mitral valve prolapse    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   Tricuspid regurgitation      Significant Hospital Events: Including procedures, antibiotic start and stop dates in addition to other pertinent events   4/9 Mitral valve repair s/p Maze procedure, extubated 4/10 Afib RVR on amio gtt, requiring pressors  4/11 A-fib controlled with Amio drip, weaning off pressors, still having some nausea secondary to vasoplegia but improving, up to chair, plan to walk later-overall progressing 4/14 changing amio to PO after bolus, changing to PO eliquis, introducer wires and foley to dc   Interim History / Subjective:   NAEO  Afib still today  \Foley out this morning   Objective   Blood  pressure (!) 120/103, pulse (!) 122, temperature 98.4 F (36.9 C), temperature source Oral, resp. rate (!) 26, height 5\' 4"  (1.626 m), weight 80.4 kg, SpO2 93%.        Intake/Output Summary (Last 24 hours) at 02/15/2024 1016 Last data filed at 02/15/2024 1000 Gross per 24 hour  Intake 555.37 ml  Output 995 ml  Net -439.63 ml   Filed Weights   02/13/24 0600 02/14/24 0600 02/15/24 0415  Weight: 78.7 kg 79.1 kg 80.4 kg   Examination: General: very pleasant elderly F NAD  HEENT:  NCAT pink mm  Cardiovascular: irir cap refill brisk  Pulmonary: even unlabored symmetrical chest expansion  Abdomen: ndnt  Skin: cdw Extremities: no acute joint deformity. Extremity edema  GU:   Resolved Hospital Problem list   AcuPost-op vasoplegia te respiratory insufficiency, postop-extubated on 4/9   Assessment & Plan:    Severe MV s/p repair Afib s/p MAZE  Acute HFrEF  P -consulting Adv HF 4/16, appreciate recs  -cont PO amio + PRN IV bolus. Converted for several hours after 3rd amio bolus  + a few g mag 4/15, back in fib 4/16. Wonder of she might need a different agent like dig.  -eliquis  -ASA, statin  -lasix 40 BID + 1x metolazone  -- will recheck BMP 4/16 PM (both for Cr and lytes) -multimodal analgesia  -mobility  -optimize lytes   N/v  P -dc scop patch -lytes ok replace as needed  -PRN zofran  -diet as tolerated   AKI Urinary retention   P -dc scop patch start urecholine -BID lasix +  metolazone - bladder scan q6 for 2d   Leukocytosis -unclear etiology, is improving w abx so will continue but no clear infectious source of yet -think this is probably reactive  P -zosyn x 5d  -follow bcx, WBC and fever curve   Hx HTN HLD -zetia  -as above lasix   Best Practice (right click and "Reselect all SmartList Selections" daily)   Diet/type: advance as tolerated  DVT prophylaxis: eliquis  GI prophylaxis: PPI Lines: na  Foley: na Code Status:  full code Last date of  multidisciplinary goals of care discussion [per primary  ]   Mod MDM   Eston Hence MSN, AGACNP-BC [place] Pulmonary/Critical Care Medicine Amion for pager  02/15/2024, 10:16 AM

## 2024-02-15 NOTE — Progress Notes (Signed)
 [address]       [place] [address]             [phone]      7 Days Post-Op  Procedure(s) (LRB): MITRAL VALVE REPAIR USING A SIMULUS SEMI-RIGID ANNULOPLASTY BAND (N/A) MAZE PROCEDURE (N/A) CLIPPING, LEFT ATRIAL APPENDAGE USING A MEDTRONIC CLIP (N/A) ECHOCARDIOGRAM, TRANSESOPHAGEAL (N/A)   Total Length of Stay:  LOS: 7 days    SUBJECTIVE: Eating better Went back into afib with ambulation today Echo reviewed   Vitals:   02/15/24 0600 02/15/24 0700  BP: 104/60 109/74  Pulse: 74 (!) 133  Resp: 14 (!) 23  Temp:    SpO2: 93% 94%    Intake/Output      04/15 0701 04/16 0700 04/16 0701 04/17 0700   I.V. (mL/kg) 283.3 (3.5)    IV Piggyback 205.3    Total Intake(mL/kg) 488.5 (6.1)    Urine (mL/kg/hr) 770 (0.4)    Stool     Total Output 770    Net -281.5         Urine Occurrence 4 x        calcium gluconate     piperacillin-tazobactam (ZOSYN)  IV 12.5 mL/hr at 02/15/24 0700    CBC    Component Value Date/Time   WBC 14.5 (H) 02/15/2024 0241   RBC 2.99 (L) 02/15/2024 0241   HGB 9.0 (L) 02/15/2024 0241   HGB 11.1 (L) 12/05/2023 0803   HGB 12.8 05/28/2008 1551   HCT 27.3 (L) 02/15/2024 0241   HCT 36.6 05/28/2008 1551   PLT 242 02/15/2024 0241   PLT 174 12/05/2023 0803   PLT 164 05/28/2008 1551   MCV 91.3 02/15/2024 0241   MCV 89.3 05/28/2008 1551   MCH 30.1 02/15/2024 0241   MCHC 33.0 02/15/2024 0241   RDW 20.4 (H) 02/15/2024 0241   RDW 13.0 05/28/2008 1551   LYMPHSABS 1.5 12/05/2023 0803   LYMPHSABS 0.4 (L) 05/28/2008 1551   MONOABS 0.7 12/05/2023 0803   MONOABS 0.8 05/28/2008 1551   EOSABS 0.3 12/05/2023 0803   EOSABS 0.3 05/28/2008 1551   BASOSABS 0.1 12/05/2023 0803   BASOSABS 0.0 05/28/2008 1551   CMP     Component Value Date/Time   NA 132 (L) 02/15/2024 0241   NA 139 12/16/2023 1638   K 4.0 02/15/2024 0241   CL 98 02/15/2024 0241   CO2 21 (L) 02/15/2024 0241   GLUCOSE 106 (H) 02/15/2024 0241   BUN  24 (H) 02/15/2024 0241   BUN 17 12/16/2023 1638   CREATININE 1.29 (H) 02/15/2024 0241   CALCIUM 6.7 (L) 02/15/2024 0241   PROT 6.1 (L) 02/07/2024 1042   PROT 6.4 04/29/2021 1102   ALBUMIN 3.8 02/07/2024 1042   ALBUMIN 4.4 04/29/2021 1102   AST 22 02/07/2024 1042   ALT 15 02/07/2024 1042   ALKPHOS 24 (L) 02/07/2024 1042   BILITOT 0.7 02/07/2024 1042   BILITOT 0.5 04/29/2021 1102   GFRNONAA 42 (L) 02/15/2024 0241   GFRAA >90 09/19/2011 0537   ABG    Component Value Date/Time   PHART 7.367 02/08/2024 1849   PCO2ART 41.3 02/08/2024 1849   PO2ART 130 (H) 02/08/2024 1849   HCO3 23.9 02/08/2024 1849   TCO2 25 02/08/2024 1849   ACIDBASEDEF 2.0 02/08/2024 1849   O2SAT 99 02/08/2024 1849   CBG (last 3)  Recent Labs    02/14/24 1934 02/14/24 2302 02/15/24 0337  GLUCAP 124* 108* 112*  EXAM  Lungs; clear Card: tachy no murmur Ext;' warm with edema Neuro; intact   ASSESSMENT: SP MV repair and maze Hemodynamics continue to be complicated with recurrent afib. Will rebolus again today with amio till converts to NSR. Reviewed echo with reduced LV function from preop but may be due to rapid afib and now repaired MV. Will have AHF team assess to optimize medical therapry Renal: slight increase in cr. Still need to diurese and monitor Bladder retention. Stop scoplomine and observe Leave in ICU On epixaban    Melene Sportsman, MD 02/15/2024

## 2024-02-15 NOTE — Progress Notes (Signed)
 Physical Therapy Treatment Patient Details Name: [name] MRN: [id number] DOB: 1941-01-10 Today's Date: 02/15/2024   History of Present Illness 83 y.o. female presents to [facility] 02/08/24 for complex MVR and MAZE (via sternotomy). PMHx: anemia, HTN, mitral valve prolapse, moderate mitral regurgitation, tricuspid regurgitation    PT Comments  Pt on BSC upon arrival and agreeable to PT session. Pt continues to need MinA to ambulate ~238ft with a RW. She has difficulty navigating obstacles and needs verbal/tactile cues to negotiate with RW. Pt reported being fatigued after gait training, however, was able to perform a few LE exercises. Pt progressed in today's session from needing MinA to CGA to stand with increased repetitions and cues for technique. Pt is progressing well towards goals. Acute PT to follow.   Resting HR- 125 BPM, 125-150 BPM with activity SpO2 >94% on RA BP 120/103   If plan is discharge home, recommend the following: A little help with walking and/or transfers;A little help with bathing/dressing/bathroom;Assistance with cooking/housework;Assist for transportation;Help with stairs or ramp for entrance   Can travel by private vehicle      Yes  Equipment Recommendations  None recommended by PT       Precautions / Restrictions Precautions Precautions: Fall;Sternal Recall of Precautions/Restrictions: Impaired Precaution/Restrictions Comments: multiple cues throughout session to adhere to sternal precautions Restrictions Other Position/Activity Restrictions: sternal     Mobility  Bed Mobility    General bed mobility comments: on BSC, returned to recliner at end of session    Transfers Overall transfer level: Needs assistance Equipment used: Rolling walker (2 wheels) Transfers: Sit to/from Stand Sit to Stand: Min assist, Contact guard assist    General transfer comment: MinA for slight boost, able to progress to CGA with increased repetition. Cues to use momentum and  have hands on knees    Ambulation/Gait Ambulation/Gait assistance: Min assist Gait Distance (Feet): 220 Feet Assistive device: Rolling walker (2 wheels) Gait Pattern/deviations: Step-through pattern, Decreased stride length, Drifts right/left, Trunk flexed Gait velocity: decr     General Gait Details: MinA for cues for obstacle negotiation and slight re-direction. Pt tends to veer right/left when ambulating.    Balance Overall balance assessment: Needs assistance Sitting-balance support: No upper extremity supported, Feet supported Sitting balance-Leahy Scale: Fair     Standing balance support: Reliant on assistive device for balance Standing balance-Leahy Scale: Fair Standing balance comment: able to stand statically with no AD       Communication Communication Communication: Impaired Factors Affecting Communication: Hearing impaired  Cognition Arousal: Alert Behavior During Therapy: WFL for tasks assessed/performed, Flat affect   PT - Cognitive impairments: No apparent impairments  Following commands: Intact      Cueing Cueing Techniques: Verbal cues, Tactile cues  Exercises General Exercises - Lower Extremity Long Arc Quad: AROM, Both, 10 reps, Seated Hip Flexion/Marching: AROM, Both, 10 reps, Seated Other Exercises Other Exercises: x5 serial STS with MinA/CGA and RW        Pertinent Vitals/Pain Pain Assessment Pain Assessment: Faces Faces Pain Scale: Hurts a little bit Pain Location: incision Pain Descriptors / Indicators: Aching Pain Intervention(s): Limited activity within patient's tolerance, Monitored during session, Repositioned     PT Goals (current goals can now be found in the care plan section) Acute Rehab PT Goals PT Goal Formulation: With patient/family Time For Goal Achievement: 02/24/24 Potential to Achieve Goals: Good Progress towards PT goals: Progressing toward goals    Frequency    Min 2X/week       AM-PAC  PT "6 Clicks"  Mobility   Outcome Measure  Help needed turning from your back to your side while in a flat bed without using bedrails?: A Little Help needed moving from lying on your back to sitting on the side of a flat bed without using bedrails?: A Little Help needed moving to and from a bed to a chair (including a wheelchair)?: A Little Help needed standing up from a chair using your arms (e.g., wheelchair or bedside chair)?: A Little Help needed to walk in hospital room?: A Little Help needed climbing 3-5 steps with a railing? : A Little 6 Click Score: 18    End of Session   Activity Tolerance: Patient tolerated treatment well Patient left: in chair;with call bell/phone within reach;with family/visitor present Nurse Communication: Mobility status;Other (comment) (HR) PT Visit Diagnosis: Unsteadiness on feet (R26.81);Muscle weakness (generalized) (M62.81);Other abnormalities of gait and mobility (R26.89)     Time: 6213-0865 PT Time Calculation (min) (ACUTE ONLY): 25 min  Charges:    $Gait Training: 8-22 mins $Therapeutic Exercise: 8-22 mins PT General Charges $$ ACUTE PT VISIT: 1 Visit                    [name], PT, DPT Secure Chat Preferred  Rehab Office [phone]    [name] 02/15/2024, 10:13 AM

## 2024-02-15 NOTE — Progress Notes (Signed)
 Peripherally Inserted Central Catheter Placement  The IV Nurse has discussed with the patient and/or persons authorized to consent for the patient, the purpose of this procedure and the potential benefits and risks involved with this procedure.  The benefits include less needle sticks, lab draws from the catheter, and the patient may be discharged home with the catheter. Risks include, but not limited to, infection, bleeding, blood clot (thrombus formation), and puncture of an artery; nerve damage and irregular heartbeat and possibility to perform a PICC exchange if needed/ordered by physician.  Alternatives to this procedure were also discussed.  Bard Power PICC patient education guide, fact sheet on infection prevention and patient information card has been provided to patient /or left at bedside.    PICC Placement Documentation  PICC Double Lumen 02/15/24 Right Basilic 40 cm 0 cm (Active)  Indication for Insertion or Continuance of Line Vasoactive infusions;Chronic illness with exacerbations (CF, Sickle Cell, etc.) 02/15/24 1331  Exposed Catheter (cm) 0 cm 02/15/24 1331  Site Assessment Clean, Dry, Intact 02/15/24 1331  Lumen #1 Status Flushed;Saline locked;Blood return noted 02/15/24 1331  Lumen #2 Status Flushed;Saline locked;Blood return noted 02/15/24 1331  Dressing Type Transparent;Securing device 02/15/24 1331  Dressing Status Antimicrobial disc/dressing in place;Clean, Dry, Intact 02/15/24 1331  Line Care Connections checked and tightened 02/15/24 1331  Line Adjustment (NICU/IV Team Only) No 02/15/24 1331  Dressing Intervention New dressing;Adhesive placed at insertion site (IV team only) 02/15/24 1331  Dressing Change Due 02/22/24 02/15/24 1331       Anthem Frazer Haywood Lisle 02/15/2024, 1:33 PM

## 2024-02-15 NOTE — Progress Notes (Signed)
 Orthopedic Tech Progress Note Patient Details:  [name] [birth year] [id number]  Ortho Devices Type of Ortho Device: Ace wrap Ortho Device/Splint Location: BLE Ortho Device/Splint Interventions: Ordered, Application, Adjustment   Post Interventions Patient Tolerated: Well Instructions Provided: Care of device  Niesha Bame L Caelen Reierson 02/15/2024, 11:45 AM

## 2024-02-15 NOTE — Consult Note (Addendum)
 Advanced Heart Failure Team Consult Note   Primary Physician: Merri Brunette, MD Cardiologist:  Yates Decamp, MD  Reason for Consultation: Acute systolic heart failure  HPI:    [name] is seen today for evaluation of acute systolic heart failure at the request of Dr. Katrinka Blazing, PCCM.   [name] is a 83 y.o. female with HTN and PAF.   Admitted 2/24 for further MR workup. She underwent L/RHC which showed [facility] [employer] 2/25 with no CAD. RA 8, PA 41/17 (14), Fick CO/CI 7.76/4.41, PAPi 3, LVEDP 11. Following her heart cath she went into a fib RVR, asymptomatic but new. TEE confirmed severe MR from posterior leaflet prolapse/flail and mod TR with EF 60%. Was initially set up for DCCV but chemically converted and was able to transition to PO amiodarone. Discharged with plans for MVR the following month with Dr. Leafy Ro.   Admitted 4/9 for MVR, MAZE and LAA clipping. Intra-Op TEE with EF 55-60% and severe MR. Post-op course complicated by persistent a fib RVR and vasoplegic shock requiring vasopressors. Able to extubate the following day. Pressors weaned off.  AHF team consulted after echo showed new stop in EF to 35-40% with persistent a fib and volume overload.   Patient sitting in chair on exam and husband at bedside. In a fib RVR with rates steady in the 120s. Had gotten amiodarone boluses with no break in a fib. Denies CP/SOB.  - Echo yesterday (02/14/24) with EF 35-40%, LV with GHK, RV normal, LA mod dilated, RA severely dilated, trivial MR, mod TR  Home Medications Prior to Admission medications   Medication Sig Start Date End Date Taking? Authorizing Provider  amiodarone (PACERONE) 200 MG tablet Take 1 tablet (200 mg total) by mouth daily. 01/13/24  Yes Azalee Course, PA  apixaban (ELIQUIS) 5 MG TABS tablet Take 1 tablet (5 mg total) by mouth 2 (two) times daily. 01/26/24  Yes Yates Decamp, MD  b complex vitamins tablet Take 1 tablet by mouth daily.     Yes [provider]  Bempedoic  Acid-Ezetimibe (NEXLIZET) 180-10 MG TABS Take 1 tablet by mouth daily.   Yes [provider]  CALCIUM PO Take 650 mg by mouth daily.   Yes [provider]  Cholecalciferol (VITAMIN D3) 125 MCG (5000 UT) CAPS Take 5,000 Units by mouth daily.   Yes [provider]  denosumab (PROLIA) 60 MG/ML SOSY injection Inject 60 mg into the skin every 6 (six) months.   Yes [provider]  furosemide (LASIX) 20 MG tablet Take 1 tablet (20 mg total) by mouth daily. 01/12/24 01/11/25 Yes Eugenio Hoes, MD  Garlic 1000 MG CAPS Take 1,000 mg by mouth daily.   Yes [provider]  [place] Lanius Omega-3 300 MG CAPS Take 300 mg by mouth daily.   Yes [provider]  loperamide (IMODIUM) 2 MG capsule Take 1 capsule (2 mg total) by mouth 4 (four) times daily as needed for diarrhea or loose stools. 01/10/21  Yes Linwood Dibbles, MD  metoprolol succinate (TOPROL-XL) 25 MG 24 hr tablet Take 25 mg by mouth 2 (two) times daily.  Take additional tablet for palpitations   Yes [provider]  Multiple Vitamins-Minerals (MULTIVITAMINS THER. W/MINERALS) TABS Take 1 tablet by mouth daily.     Yes [provider]  pantoprazole (PROTONIX) 40 MG tablet Take 40 mg by mouth daily.   Yes [provider]  potassium chloride SA (KLOR-CON M) 20 MEQ tablet Take 0.5 tablets (  10 mEq total) by mouth daily. 01/12/24  Yes Melene Sportsman, MD  Zinc 50 MG TABS Take 50 mg by mouth daily.   Yes [provider]  apixaban (ELIQUIS) 5 MG TABS tablet Take 1 tablet (5 mg total) by mouth 2 (two) times daily. 01/31/24   [name], MD  atorvastatin (LIPITOR) 10 MG tablet Take 1 tablet (10 mg total) by mouth daily. Patient not taking: Reported on 01/25/2024 12/20/23 03/19/24  [name], MD  ezetimibe (ZETIA) 10 MG tablet Take 1 tablet (10 mg total) by mouth daily. Patient not taking: Reported on 01/25/2024 12/20/23 03/19/24  [name], MD    Past Medical History: Past Medical History:   Diagnosis Date   Acid reflux    Anemia    Arthritis    Atrial fibrillation [facility])    Cataract    Diverticulosis of colon    Endometrial cancer (HCC)    1A grade endometrial ca   Essential hypertension 01/24/2009   Qualifier: Diagnosis of  By: Nelson-Smith CMA (AAMA), Dottie     Hx of radiation therapy 04/24/08-05/29/08& 8/12/,8/19,&06/27/08   external beam  through 7/09 ,then intracavity in 06/2008   Hypertension    Mitral valve prolapse    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   Tricuspid regurgitation     Past Surgical History: Past Surgical History:  Procedure Laterality Date   APPENDECTOMY     1967   arthroscopic knee right surgery     CLIPPING OF ATRIAL APPENDAGE N/A 02/08/2024   Procedure: CLIPPING, LEFT ATRIAL APPENDAGE USING A MEDTRONIC CLIP;  Surgeon: Melene Sportsman, MD;  Location: [facility] OR;  Service: Open Heart Surgery;  Laterality: N/A;   ECTOPIC PREGNANCY SURGERY  1967   iron infusion  12/2022   LAPAROSCOPIC HYSTERECTOMY     total   MAZE N/A 02/08/2024   Procedure: MAZE PROCEDURE;  Surgeon: Melene Sportsman, MD;  Location: [facility];  Service: Open Heart Surgery;  Laterality: N/A;   MITRAL VALVE REPAIR N/A 02/08/2024   Procedure: MITRAL VALVE REPAIR USING A SIMULUS SEMI-RIGID ANNULOPLASTY BAND;  Surgeon: Melene Sportsman, MD;  Location: [facility] OR;  Service: Open Heart Surgery;  Laterality: N/A;  possible replacement   ORTHOPEDIC SURGERY     Orthoscopic Knee Surgery   RIGHT/LEFT HEART CATH AND CORONARY ANGIOGRAPHY N/A 12/30/2023   Procedure: RIGHT/LEFT HEART CATH AND CORONARY ANGIOGRAPHY;  Surgeon: [name], MD;  Location: [facility] INVASIVE CV LAB;  Service: Cardiovascular;  Laterality: N/A;   TEAR DUCT PROBING     TEE WITHOUT CARDIOVERSION N/A 02/08/2024   Procedure: ECHOCARDIOGRAM, TRANSESOPHAGEAL;  Surgeon: Melene Sportsman, MD;  Location: [facility] OR;  Service: Open Heart Surgery;  Laterality: N/A;   TOOTH EXTRACTION     bone graft   TRANSESOPHAGEAL ECHOCARDIOGRAM (CATH LAB) N/A  12/30/2023   Procedure: TRANSESOPHAGEAL ECHOCARDIOGRAM;  Surgeon: Euell Herrlich, MD;  Location: [facility] INVASIVE CV LAB;  Service: Cardiovascular;  Laterality: N/A;    Family History: Family History  Problem Relation Age of Onset   Diabetes type II Mother    Breast cancer Mother    Leukemia Father    Diabetes type II Sister    Breast cancer Sister    Cancer Brother 54       throat 10/2020   Pancreatic cancer Maternal Aunt        x2   Diabetes Maternal Grandmother    Diabetes Maternal Grandfather    Colon cancer Neg Hx    Stomach cancer Neg Hx    Esophageal  cancer Neg Hx     Social History: Social History   Socioeconomic History   Marital status: Married    Spouse name: Not on file   Number of children: 1   Years of education: Not on file   Highest education level: Not on file  Occupational History   Occupation: Environmental health practitioner  Tobacco Use   Smoking status: Never   Smokeless tobacco: Never  Vaping Use   Vaping status: Never Used  Substance and Sexual Activity   Alcohol use: Yes    Comment: rarely   Drug use: No   Sexual activity: Never  Other Topics Concern   Not on file  Social History Narrative   Not on file   Social Drivers of Health   Financial Resource Strain: Not on file  Food Insecurity: No Food Insecurity (02/08/2024)   Hunger Vital Sign    Worried About Running Out of Food in the Last Year: Never true    Ran Out of Food in the Last Year: Never true  Transportation Needs: No Transportation Needs (02/08/2024)   PRAPARE - Administrator, Civil Service (Medical): No    Lack of Transportation (Non-Medical): No  Physical Activity: Not on file  Stress: Not on file  Social Connections: Socially Integrated (02/09/2024)   Social Connection and Isolation Panel [NHANES]    Frequency of Communication with Friends and Family: More than three times a week    Frequency of Social Gatherings with Friends and Family: More than three times a week     Attends Religious Services: More than 4 times per year    Active Member of [employer] or Organizations: Yes    Attends Engineer, structural: More than 4 times per year    Marital Status: Married    Allergies:  Allergies  Allergen Reactions   Crestor [Rosuvastatin] Other (See Comments)    Leg cramps   Glycopyrrolate     Other reaction(s): extreme dry mouth   Codeine Other (See Comments)    Hyper    Objective:    Vital Signs:   Temp:  [97.9 F (36.6 C)-98.9 F (37.2 C)] 98.4 F (36.9 C) (04/16 0850) Pulse Rate:  [64-133] 115 (04/16 1040) Resp:  [13-30] 27 (04/16 1040) BP: (76-124)/(51-103) 124/73 (04/16 1040) SpO2:  [90 %-97 %] 93 % (04/16 1040) Weight:  [80.4 kg] 80.4 kg (04/16 0415) Last BM Date : 02/14/24  Weight change: Filed Weights   02/13/24 0600 02/14/24 0600 02/15/24 0415  Weight: 78.7 kg 79.1 kg 80.4 kg    Intake/Output:   Intake/Output Summary (Last 24 hours) at 02/15/2024 1110 Last data filed at 02/15/2024 1000 Gross per 24 hour  Intake 555.37 ml  Output 995 ml  Net -439.63 ml      Physical Exam  General:  well appearing.  No respiratory difficulty Neck: supple. JVD to jaw.  Cor: PMI nondisplaced. Irregular rate & rhythm. No rubs, gallops or murmurs. Lungs: clear Extremities: no cyanosis, clubbing, rash, +2-3 BLE edema  Neuro: alert & oriented x 3. Moves all 4 extremities w/o difficulty. Affect pleasant.   Telemetry   A fib RVR 120s (Personally reviewed)    EKG    A fib PVCs 80s 02/09/24  Labs   Basic Metabolic Panel: Recent Labs  Lab 02/09/24 1602 02/10/24 0355 02/11/24 0515 02/12/24 0422 02/13/24 0400 02/14/24 1001 02/15/24 0241  NA 136 133* 132* 134* 135 133* 132*  K 3.6 4.7 4.4 4.1 3.6 3.8 4.0  CL 105  106 101 102 101 100 98  CO2 18* 19* 19* 22 22 22  21*  GLUCOSE 224* 152* 126* 111* 94 114* 106*  BUN 18 21 24* 25* 24* 20 24*  CREATININE 1.27* 1.17* 1.18* 1.22* 1.13* 1.15* 1.29*  CALCIUM 6.7* 6.9* 6.7* 6.7* 6.6* 6.8*  6.7*  MG 2.3 2.4 2.4 2.5*  --   --  2.9*  PHOS  --  2.4* 2.2* 2.4* 2.4*  --   --     Liver Function Tests: No results for input(s): "AST", "ALT", "ALKPHOS", "BILITOT", "PROT", "ALBUMIN" in the last 168 hours. No results for input(s): "LIPASE", "AMYLASE" in the last 168 hours. No results for input(s): "AMMONIA" in the last 168 hours.  CBC: Recent Labs  Lab 02/11/24 0515 02/12/24 0422 02/13/24 0400 02/14/24 0316 02/15/24 0241  WBC 26.5* 13.5* 13.7* 13.8* 14.5*  HGB 7.6* 7.1* 8.5* 9.0* 9.0*  HCT 23.4* 22.1* 25.8* 27.2* 27.3*  MCV 93.2 92.5 91.8 91.0 91.3  PLT 143* 112* 136* 181 242    Cardiac Enzymes: No results for input(s): "CKTOTAL", "CKMB", "CKMBINDEX", "TROPONINI" in the last 168 hours.  BNP: BNP (last 3 results) No results for input(s): "BNP" in the last 8760 hours.  ProBNP (last 3 results) No results for input(s): "PROBNP" in the last 8760 hours.   CBG: Recent Labs  Lab 02/14/24 1545 02/14/24 1934 02/14/24 2302 02/15/24 0337 02/15/24 0847  GLUCAP 106* 124* 108* 112* 112*    Coagulation Studies: No results for input(s): "LABPROT", "INR" in the last 72 hours.   Imaging   [place] EKG SITE RITE Result Date: 02/15/2024 If Site Rite image not attached, placement could not be confirmed due to current cardiac rhythm.  ECHOCARDIOGRAM COMPLETE Result Date: 02/14/2024    ECHOCARDIOGRAM REPORT   Patient Name:   [name] Date of Exam: 02/14/2024 Medical Rec #:  [id number]    Height:       64.0 in Accession #:    [id number]   Weight:       174.4 lb Date of Birth:  08/30/1941    BSA:          1.846 m Patient Age:    82 years     BP:           89/57 mmHg Patient Gender: F            HR:           109 bpm. Exam Location:  Inpatient Procedure: 2D Echo, Cardiac Doppler and Color Doppler (Both Spectral and Color            Flow Doppler were utilized during procedure). Indications:    S/P Mitral Valve repair Z98.89  History:        Patient has prior history of Echocardiogram  examinations, most                 recent 02/08/2024. Mitral Valve Disease, Arrythmias:Atrial                 Fibrillation; Risk Factors:Hypertension.                  Mitral Valve: valve is present in the mitral position. Procedure                 Date: 02/08/24.  Sonographer:    Darlys Gales Referring Phys: [id number] Eugenio Hoes IMPRESSIONS  1. Left ventricular ejection fraction, by estimation, is 35 to 40%. The left ventricle has moderately decreased function. The left ventricle  demonstrates global hypokinesis.  2. Right ventricular systolic function is normal. The right ventricular size is mildly enlarged.  3. Left atrial size was moderately dilated.  4. Right atrial size was severely dilated.  5. Large pleural effusion in the left lateral region.  6. The mitral valve has been repaired/replaced. Trivial mitral valve regurgitation. Mild mitral stenosis. The mean mitral valve gradient is 4.0 mmHg. There is a present in the mitral position. Procedure Date: 02/08/24.  7. Tricuspid valve regurgitation is moderate.  8. The aortic valve is normal in structure. There is moderate calcification of the aortic valve. Aortic valve regurgitation is not visualized. Aortic valve sclerosis is present, with no evidence of aortic valve stenosis. Aortic valve Vmax measures 1.31 m/s.  9. The inferior vena cava is normal in size with greater than 50% respiratory variability, suggesting right atrial pressure of 3 mmHg. FINDINGS  Left Ventricle: Left ventricular ejection fraction, by estimation, is 35 to 40%. The left ventricle has moderately decreased function. The left ventricle demonstrates global hypokinesis. The left ventricular internal cavity size was normal in size. There is no left ventricular hypertrophy. Right Ventricle: The right ventricular size is mildly enlarged. No increase in right ventricular wall thickness. Right ventricular systolic function is normal. Left Atrium: Left atrial size was moderately dilated. Right Atrium:  Right atrial size was severely dilated. Pericardium: There is no evidence of pericardial effusion. Mitral Valve: The mitral valve has been repaired/replaced. Trivial mitral valve regurgitation. There is a present in the mitral position. Procedure Date: 02/08/24. Mild mitral valve stenosis. MV peak gradient, 8.1 mmHg. The mean mitral valve gradient is 4.0 mmHg. Tricuspid Valve: The tricuspid valve is normal in structure. Tricuspid valve regurgitation is moderate . No evidence of tricuspid stenosis. Aortic Valve: The aortic valve is normal in structure. There is moderate calcification of the aortic valve. Aortic valve regurgitation is not visualized. Aortic valve sclerosis is present, with no evidence of aortic valve stenosis. Aortic valve mean gradient measures 4.0 mmHg. Aortic valve peak gradient measures 6.9 mmHg. Aortic valve area, by VTI measures 1.66 cm. Pulmonic Valve: The pulmonic valve was normal in structure. Pulmonic valve regurgitation is not visualized. No evidence of pulmonic stenosis. Aorta: The aortic root is normal in size and structure. Venous: The inferior vena cava is normal in size with greater than 50% respiratory variability, suggesting right atrial pressure of 3 mmHg. IAS/Shunts: No atrial level shunt detected by color flow Doppler. Additional Comments: There is a large pleural effusion in the left lateral region.  LEFT VENTRICLE PLAX 2D LVIDd:         4.90 cm LVIDs:         3.30 cm LV PW:         0.90 cm LV IVS:        0.80 cm LVOT diam:     1.80 cm LV SV:         30 LV SV Index:   16 LVOT Area:     2.54 cm  RIGHT VENTRICLE RV S prime:     8.59 cm/s LEFT ATRIUM             Index        RIGHT ATRIUM           Index LA Vol (A2C):   91.3 ml 49.47 ml/m  RA Area:     18.00 cm LA Vol (A4C):   57.3 ml 31.05 ml/m  RA Volume:   42.70 ml  23.14 ml/m LA Biplane  Vol: 75.3 ml 40.80 ml/m  AORTIC VALVE AV Area (Vmax):    1.46 cm AV Area (Vmean):   1.38 cm AV Area (VTI):     1.66 cm AV Vmax:            131.00 cm/s AV Vmean:          91.200 cm/s AV VTI:            0.179 m AV Peak Grad:      6.9 mmHg AV Mean Grad:      4.0 mmHg LVOT Vmax:         75.00 cm/s LVOT Vmean:        49.300 cm/s LVOT VTI:          0.117 m LVOT/AV VTI ratio: 0.65  AORTA Ao Root diam: 3.00 cm MITRAL VALVE                TRICUSPID VALVE MV Area (PHT): 5.70 cm     TR Peak grad:   26.6 mmHg MV Area VTI:   1.20 cm     TR Vmax:        258.00 cm/s MV Peak grad:  8.1 mmHg MV Mean grad:  4.0 mmHg     SHUNTS MV Vmax:       1.42 m/s     Systemic VTI:  0.12 m MV Vmean:      89.5 cm/s    Systemic Diam: 1.80 cm MV Decel Time: 133 msec MV E velocity: 123.00 cm/s Dorothye Gathers MD Electronically signed by Dorothye Gathers MD Signature Date/Time: 02/14/2024/3:56:49 PM    Final      Medications:     Current Medications:  acetaminophen  650 mg Oral Once   amiodarone  150 mg Intravenous Once   apixaban  5 mg Oral BID   aspirin  81 mg Oral Daily   bethanechol  5 mg Oral TID   bisacodyl  10 mg Oral Daily   Or   bisacodyl  10 mg Rectal Daily   Chlorhexidine Gluconate Cloth  6 each Topical Daily   docusate sodium  200 mg Oral Daily   ezetimibe  10 mg Oral Daily   feeding supplement  1 Container Oral TID BM   insulin aspart  2-6 Units Subcutaneous Q4H   pantoprazole  40 mg Oral Daily   senna  1 tablet Oral Daily   sodium chloride flush  3 mL Intravenous Q12H   sodium chloride flush  3-10 mL Intravenous Q12H   traMADol  50 mg Oral Q6H    Infusions:  amiodarone     Followed by   amiodarone     furosemide (LASIX) 200 mg in dextrose 5 % 100 mL (2 mg/mL) infusion     piperacillin-tazobactam (ZOSYN)  IV 12.5 mL/hr at 02/15/24 1000      Patient Profile   [name] is a 83 y.o. female with HTN and PAF. Admitted with SOB> found to have severe MR. Now s/p MVR, MAZE and LAA clipping. AHF team consulted for acute systolic heart failure.   Assessment/Plan   Acute systolic heart failure, NiCM - Intra-op TEE 02/08/24 with EF 55-60% and severe  MR - [facility] 2/25 with no CAD. RA 8, PA 41/17 (14), Fick CO/CI 7.76/4.41, PAPi 3, LVEDP 11 - Echo yesterday (02/14/24) with EF 35-40%, LV with GHK, RV normal, LA mod dilated, RA severely dilated, trivial MR, mod TR - Suspect drop in EF 2/2 persistent a fib RVR.  -  NYHA II-IIIa today, IV on admission - Volume overloaded. Got metolazone and small dose of lasix this morning. Will start lasix gtt at 8/hr and follow response.  - Place PICC, follow CVP and co-ox - Renal function relatively stable.  - Place UNNA boots - BP soft. Will follow for GDMT addition after diuresis started.  - Strict I&O, daily weights   Atrial fibrillation  - S/p Maze and LAA clipping 4/25 - Remains in a fib RVR despite multiple amiodarone boluses - Will start amiodarone gtt to control rates - Continue eliquis 5 mg BID  Severe MR s/p MVR - Intra-op TEE with severe MR 02/08/24 - POD 7 - Trivial MR on echo 02/14/24  HTN - SBP soft but stable - Will follow with diuresis - Avoid hypotension  Addendum: 02/15/24 3:03 PM  CVP 13/14 and co-ox 54%. Start milrinone 0.125 to help with diuresis.   CRITICAL CARE Performed by: Alen Bleacher   Total critical care time: 16 minutes  Critical care time was exclusive of separately billable procedures and treating other patients.  Critical care was necessary to treat or prevent imminent or life-threatening deterioration.  Critical care was time spent personally by me on the following activities: development of treatment plan with patient and/or surrogate as well as nursing, discussions with consultants, evaluation of patient's response to treatment, examination of patient, obtaining history from patient or surrogate, ordering and performing treatments and interventions, ordering and review of laboratory studies, ordering and review of radiographic studies, pulse oximetry and re-evaluation of patient's condition.   Length of Stay: 7  Alen Bleacher, NP  02/15/2024, 11:10  AM   Advanced Heart Failure Team Pager [phone] (M-F; 7a - 5p)  Please contact [facility] for night-coverage after hours (4p -7a ) and weekends on [email or web address]   Agree with above.  83 y/o woman with HTN and PAF. Found to have severe MR with flail.   Underwent MV repait and maze on 02/06/24. Post-op course c/b recurrent AF, volume overload.   Echo shows newly reduced EF 35-40% with moderate RV dysfunction   Says she feels ok. Denies CP or SOB. ++ edema  General:  Elderly woman sitting in chair No resp difficulty HEENT: normal Neck: supple. JVP to ear   Cor: Sternal wound ok irreg Lungs: decreased at bases Abdomen: soft, nontender, nondistended. No hepatosplenomegaly. No bruits or masses. Good bowel sounds. Extremities: no cyanosis, clubbing, rash, 3-4+ edema Neuro: alert & orientedx3, cranial nerves grossly intact. moves all 4 extremities w/o difficulty. Affect pleasant  She is massively volume overloaded (26 pounds) post-op with R>L HF after MV repair. Course c/b recurrent AF  Will place PICC. Will likely need milrinone for RV support. Place UNNA and start lasix and IV amio.   CRITICAL CARE Performed by: Arvilla Meres  Total critical care time: 50 minutes  Critical care time was exclusive of separately billable procedures and treating other patients.  Critical care was necessary to treat or prevent imminent or life-threatening deterioration.  Critical care was time spent personally by me (independent of midlevel providers or residents) on the following activities: development of treatment plan with patient and/or surrogate as well as nursing, discussions with consultants, evaluation of patient's response to treatment, examination of patient, obtaining history from patient or surrogate, ordering and performing treatments and interventions, ordering and review of laboratory studies, ordering and review of radiographic studies, pulse oximetry and re-evaluation of patient's  condition.  Arvilla Meres, MD  6:06 PM

## 2024-02-15 NOTE — Progress Notes (Signed)
      [address]       [place] [id number]             [phone]      POD # 7 MV repair, Maze, LAA clip  BP 121/63   Pulse (!) 115   Temp 98.1 F (36.7 C) (Oral)   Resp (!) 23   Ht 5\' 4"  (1.626 m)   Wt 80.4 kg   SpO2 93%   BMI 30.42 kg/m  A fib with rate in 100- 120 range On amiodarone at 60 mg/hr Lasix drip Started on milrinone 0.125  Co-ox 54 prior to starting milrinone   Intake/Output Summary (Last 24 hours) at 02/15/2024 1742 Last data filed at 02/15/2024 1700 Gross per 24 hour  Intake 490.3 ml  Output 1670 ml  Net -1179.7 ml   Landon Pinion C. Luna Salinas, MD [facility] [phone]

## 2024-02-15 NOTE — Progress Notes (Signed)
 Heart Failure Navigator Progress Note  Assessed for Heart & Vascular TOC clinic readiness.  Patient does not meet criteria due to Advanced Heart Failure Team consulted. .   Navigator will sign off at this time.   Rhae Hammock, BSN, Scientist, clinical (histocompatibility and immunogenetics) Only

## 2024-02-15 NOTE — Plan of Care (Signed)

## 2024-02-16 DIAGNOSIS — I480 Paroxysmal atrial fibrillation: Secondary | ICD-10-CM | POA: Diagnosis not present

## 2024-02-16 DIAGNOSIS — I5021 Acute systolic (congestive) heart failure: Secondary | ICD-10-CM | POA: Diagnosis not present

## 2024-02-16 DIAGNOSIS — I340 Nonrheumatic mitral (valve) insufficiency: Secondary | ICD-10-CM | POA: Diagnosis not present

## 2024-02-16 LAB — TYPE AND SCREEN
ABO/RH(D): A POS
Antibody Screen: NEGATIVE
Unit division: 0
Unit division: 0
Unit division: 0
Unit division: 0

## 2024-02-16 LAB — CBC
HCT: 27.6 % — ABNORMAL LOW (ref 36.0–46.0)
Hemoglobin: 8.9 g/dL — ABNORMAL LOW (ref 12.0–15.0)
MCH: 29.7 pg (ref 26.0–34.0)
MCHC: 32.2 g/dL (ref 30.0–36.0)
MCV: 92 fL (ref 80.0–100.0)
Platelets: 312 10*3/uL (ref 150–400)
RBC: 3 MIL/uL — ABNORMAL LOW (ref 3.87–5.11)
RDW: 19.9 % — ABNORMAL HIGH (ref 11.5–15.5)
WBC: 16.5 10*3/uL — ABNORMAL HIGH (ref 4.0–10.5)
nRBC: 0.4 % — ABNORMAL HIGH (ref 0.0–0.2)

## 2024-02-16 LAB — BPAM RBC
Blood Product Expiration Date: 202505072359
Blood Product Expiration Date: 202505072359
Blood Product Expiration Date: 202505072359
Blood Product Expiration Date: 202505082359
ISSUE DATE / TIME: 202504091002
ISSUE DATE / TIME: 202504130916
Unit Type and Rh: 6200
Unit Type and Rh: 6200
Unit Type and Rh: 6200
Unit Type and Rh: 6200

## 2024-02-16 LAB — BASIC METABOLIC PANEL WITH GFR
Anion gap: 17 — ABNORMAL HIGH (ref 5–15)
BUN: 24 mg/dL — ABNORMAL HIGH (ref 8–23)
CO2: 23 mmol/L (ref 22–32)
Calcium: 7 mg/dL — ABNORMAL LOW (ref 8.9–10.3)
Chloride: 89 mmol/L — ABNORMAL LOW (ref 98–111)
Creatinine, Ser: 1.34 mg/dL — ABNORMAL HIGH (ref 0.44–1.00)
GFR, Estimated: 40 mL/min — ABNORMAL LOW (ref >60)
Glucose, Bld: 167 mg/dL — ABNORMAL HIGH (ref 70–99)
Potassium: 3.6 mmol/L (ref 3.5–5.1)
Sodium: 129 mmol/L — ABNORMAL LOW (ref 135–145)

## 2024-02-16 LAB — CULTURE, BLOOD (ROUTINE X 2)
Culture: NO GROWTH
Culture: NO GROWTH

## 2024-02-16 LAB — MAGNESIUM: Magnesium: 2.1 mg/dL (ref 1.7–2.4)

## 2024-02-16 LAB — COOXEMETRY PANEL
Carboxyhemoglobin: 2.2 % — ABNORMAL HIGH (ref 0.5–1.5)
Methemoglobin: <0.7 % (ref 0.0–1.5)
O2 Saturation: 62.9 %
Total hemoglobin: 8.9 g/dL — ABNORMAL LOW (ref 12.0–16.0)

## 2024-02-16 LAB — GLUCOSE, CAPILLARY
Glucose-Capillary: 111 mg/dL — ABNORMAL HIGH (ref 70–99)
Glucose-Capillary: 158 mg/dL — ABNORMAL HIGH (ref 70–99)
Glucose-Capillary: 155 mg/dL — ABNORMAL HIGH (ref 70–99)
Glucose-Capillary: 139 mg/dL — ABNORMAL HIGH (ref 70–99)
Glucose-Capillary: 118 mg/dL — ABNORMAL HIGH (ref 70–99)

## 2024-02-16 MED ORDER — LOPERAMIDE HCL 1 MG/7.5ML PO SUSP
2.0000 mg | Freq: Once | ORAL | Status: DC
  Filled 2024-02-16: qty 15

## 2024-02-16 MED ORDER — POTASSIUM CHLORIDE CRYS ER 20 MEQ PO TBCR
40.0000 meq | EXTENDED_RELEASE_TABLET | ORAL | Status: DC
  Administered 2024-02-16: 40 meq via ORAL
  Filled 2024-02-16 (×2): qty 2

## 2024-02-16 MED ORDER — AMIODARONE LOAD VIA INFUSION
150.0000 mg | Freq: Once | INTRAVENOUS | Status: AC
  Administered 2024-02-16: 150 mg via INTRAVENOUS
  Filled 2024-02-16: qty 83.34

## 2024-02-16 MED ORDER — ONDANSETRON HCL 4 MG/2ML IJ SOLN
4.0000 mg | Freq: Four times a day (QID) | INTRAMUSCULAR | Status: DC | PRN
Start: 2024-02-16 — End: 2024-03-10
  Administered 2024-02-16 – 2024-02-20 (×2): 4 mg via INTRAVENOUS
  Filled 2024-02-16 (×2): qty 2

## 2024-02-16 MED ORDER — AMIODARONE IV BOLUS ONLY 150 MG/100ML: 150.0000 mg | Freq: Once | INTRAVENOUS | Status: DC

## 2024-02-16 MED ORDER — DIGOXIN 0.1 MG/ML IJ SOLN: 0.2500 mg | Freq: Once | INTRAMUSCULAR | Status: DC

## 2024-02-16 MED ORDER — POTASSIUM CHLORIDE 20 MEQ PO PACK
40.0000 meq | PACK | Freq: Once | ORAL | Status: AC
  Administered 2024-02-16: 40 meq via ORAL
  Filled 2024-02-16: qty 2

## 2024-02-16 MED ORDER — DIGOXIN 0.1 MG/ML IJ SOLN: 0.5000 mg | Freq: Once | INTRAMUSCULAR | Status: DC

## 2024-02-16 NOTE — Progress Notes (Signed)
 Occupational Therapy Treatment Patient Details Name: [name] MRN: [id number] DOB: 02/09/1941 Today's Date: 02/16/2024   History of present illness 83 y.o. female presents to [facility] 02/08/24 for complex MVR and MAZE (via sternotomy). PMHx: anemia, HTN, mitral valve prolapse, moderate mitral regurgitation, tricuspid regurgitation   OT comments  Patient with continued steady progress toward patient focused goals.  Patient's biggest deficits is elevated HR and numerous lines/leeds.  Patient can probably do more, but remains limited by lines/leeds.  OT will continue efforts in the acute setting to address deficits, with HH continued to be recommended for post acute rehab.        If plan is discharge home, recommend the following:  Two people to help with walking and/or transfers;Two people to help with bathing/dressing/bathroom;Assistance with cooking/housework;Assist for transportation;Help with stairs or ramp for entrance   Equipment Recommendations  BSC/3in1    Recommendations for Other Services      Precautions / Restrictions Precautions Precautions: Fall;Sternal Recall of Precautions/Restrictions: Intact Precaution/Restrictions Comments: better with sternal cues this date Restrictions Weight Bearing Restrictions Per Provider Order: Yes Other Position/Activity Restrictions: sternal       Mobility Bed Mobility               General bed mobility comments: on BSC, returned to recliner at end of session    Transfers Overall transfer level: Needs assistance Equipment used: Rolling walker (2 wheels) Transfers: Sit to/from Stand, Bed to chair/wheelchair/BSC Sit to Stand: Contact guard assist, Min assist     Step pivot transfers: Contact guard assist           Balance Overall balance assessment: Needs assistance Sitting-balance support: No upper extremity supported, Feet supported Sitting balance-Leahy Scale: Good     Standing balance support: Reliant on assistive  device for balance Standing balance-Leahy Scale: Fair                             ADL either performed or assessed with clinical judgement   ADL                       Lower Body Dressing: Moderate assistance;Cueing for compensatory techniques;Sit to/from stand       Toileting- Architect and Hygiene: Maximal assistance;Sit to/from stand              Extremity/Trunk Assessment Upper Extremity Assessment Upper Extremity Assessment: Overall WFL for tasks assessed   Lower Extremity Assessment Lower Extremity Assessment: Defer to PT evaluation        Vision Baseline Vision/History: 1 Wears glasses Patient Visual Report: No change from baseline     Perception Perception Perception: Not tested   Praxis Praxis Praxis: Not tested   Communication Communication Communication: Impaired Factors Affecting Communication: Hearing impaired   Cognition Arousal: Alert Behavior During Therapy: WFL for tasks assessed/performed, Flat affect Cognition: No apparent impairments                               Following commands: Intact        Cueing   Cueing Techniques: Verbal cues, Tactile cues  Exercises      Shoulder Instructions       General Comments      Pertinent Vitals/ Pain       Pain Assessment Faces Pain Scale: Hurts a little bit Pain Location: incision Pain Descriptors / Indicators: Sore Pain  Intervention(s): Monitored during session                                                          Frequency  Min 2X/week        Progress Toward Goals  OT Goals(current goals can now be found in the care plan section)  Progress towards OT goals: Progressing toward goals  Acute Rehab OT Goals OT Goal Formulation: With patient Time For Goal Achievement: 02/24/24 Potential to Achieve Goals: Good  Plan      Co-evaluation                 AM-PAC OT "6 Clicks" Daily Activity      Outcome Measure   Help from another person eating meals?: A Little Help from another person taking care of personal grooming?: A Little Help from another person toileting, which includes using toliet, bedpan, or urinal?: A Lot Help from another person bathing (including washing, rinsing, drying)?: A Lot Help from another person to put on and taking off regular upper body clothing?: A Lot Help from another person to put on and taking off regular lower body clothing?: A Lot 6 Click Score: 14    End of Session Equipment Utilized During Treatment: Oxygen  OT Visit Diagnosis: Other abnormalities of gait and mobility (R26.89);Muscle weakness (generalized) (M62.81);Other (comment)   Activity Tolerance Patient tolerated treatment well;Treatment limited secondary to medical complications (Comment)   Patient Left in bed;with call bell/phone within reach;with family/visitor present   Nurse Communication Mobility status;Other (comment)        Time: 1610-9604 OT Time Calculation (min): 25 min  Charges: OT General Charges $OT Visit: 1 Visit OT Treatments $Self Care/Home Management : 8-22 mins $Therapeutic Activity: 8-22 mins  02/16/2024  RP, OTR/L  Acute Rehabilitation Services  Office:  [phone]   Benjamen Brand 02/16/2024, 8:51 AM

## 2024-02-16 NOTE — Progress Notes (Addendum)
 TCTS DAILY ICU PROGRESS NOTE                   [address]            [place] [address]          [phone]   8 Days Post-Op Procedure(s) (LRB): MITRAL VALVE REPAIR USING A SIMULUS SEMI-RIGID ANNULOPLASTY BAND (N/A) MAZE PROCEDURE (N/A) CLIPPING, LEFT ATRIAL APPENDAGE USING A MEDTRONIC CLIP (N/A) ECHOCARDIOGRAM, TRANSESOPHAGEAL (N/A)  Total Length of Stay:  LOS: 8 days   Subjective: Patient walked 3 times yesterday. Her stool is loose.  Objective: Vital signs in last 24 hours: Temp:  [97.6 F (36.4 C)-98.4 F (36.9 C)] 98.1 F (36.7 C) (04/17 0400) Pulse Rate:  [107-143] 140 (04/17 0700) Cardiac Rhythm: Atrial fibrillation (04/16 2000) Resp:  [14-27] 22 (04/17 0700) BP: (88-127)/(56-103) 90/69 (04/17 0700) SpO2:  [87 %-99 %] 93 % (04/17 0700) Weight:  [77.4 kg] 77.4 kg (04/17 0500)  Filed Weights   02/14/24 0600 02/15/24 0415 02/16/24 0500  Weight: 79.1 kg 80.4 kg 77.4 kg    Weight change: -3 kg   Hemodynamic parameters for last 24 hours: CVP:  [10 mmHg-16 mmHg] 10 mmHg  Intake/Output from previous day: 04/16 0701 - 04/17 0700 In: 1180.5 [I.V.:655.3; IV Piggyback:525.2] Out: 2735 [Urine:2735]  Intake/Output this shift: No intake/output data recorded.  Current Meds: Scheduled Meds:  apixaban  5 mg Oral BID   aspirin  81 mg Oral Daily   bethanechol  5 mg Oral TID   bisacodyl  10 mg Oral Daily   Or   bisacodyl  10 mg Rectal Daily   Chlorhexidine Gluconate Cloth  6 each Topical Daily   docusate sodium  200 mg Oral Daily   ezetimibe  10 mg Oral Daily   feeding supplement  1 Container Oral TID BM   insulin aspart  2-6 Units Subcutaneous Q4H   pantoprazole  40 mg Oral Daily   potassium chloride  40 mEq Oral Q4H   senna  1 tablet Oral Daily   sodium chloride flush  10-40 mL Intracatheter Q12H   sodium chloride flush  3 mL Intravenous Q12H   sodium chloride flush  3-10 mL Intravenous Q12H   traMADol  50 mg Oral Q6H    Continuous Infusions:  amiodarone 30 mg/hr (02/16/24 0700)   furosemide (LASIX) 200 mg in dextrose 5 % 100 mL (2 mg/mL) infusion 8 mg/hr (02/16/24 0700)   milrinone 0.125 mcg/kg/min (02/16/24 0700)   PRN Meds:.dextrose, metoprolol tartrate, morphine injection, ondansetron (ZOFRAN) IV, mouth rinse, oxyCODONE, phenol, sodium chloride flush, sodium chloride flush, sodium chloride flush  General appearance: Alert, cooperative, and no distress Neurologic: Intact Heart: IRRR IRRR Lungs: Clear to auscultation bilaterally Abdomen: Soft, non tender, bowel sounds Extremities: Unna boots Wound: Sternal wound is clean, dry, healing without signs of infection  Lab Results: CBC: Recent Labs    02/15/24 0241 02/16/24 0400  WBC 14.5* 16.5*  HGB 9.0* 8.9*  HCT 27.3* 27.6*  PLT 242 312   BMET:  Recent Labs    02/15/24 1743 02/16/24 0400  NA 129* 129*  K 2.9* 3.6  CL 92* 89*  CO2 24 23  GLUCOSE 203* 167*  BUN 24* 24*  CREATININE 1.28* 1.34*  CALCIUM 7.1* 7.0*    CMET: Lab Results  Component Value Date   WBC 16.5 (H) 02/16/2024   HGB 8.9 (L) 02/16/2024   HCT 27.6 (L) 02/16/2024   PLT 312 02/16/2024  GLUCOSE 167 (H) 02/16/2024   CHOL 104 04/29/2021   TRIG 157 (H) 04/29/2021   HDL 38 (L) 04/29/2021   LDLCALC 39 04/29/2021   ALT 15 02/07/2024   AST 22 02/07/2024   NA 129 (L) 02/16/2024   K 3.6 02/16/2024   CL 89 (L) 02/16/2024   CREATININE 1.34 (H) 02/16/2024   BUN 24 (H) 02/16/2024   CO2 23 02/16/2024   TSH 3.482 12/30/2023   INR 1.6 (H) 02/08/2024   HGBA1C 4.7 (L) 02/07/2024      PT/INR: No results for input(s): "LABPROT", "INR" in the last 72 hours. Radiology: DG CHEST PORT 1 VIEW Result Date: 02/15/2024 CLINICAL DATA:  [id number] Status post PICC central line placement [id number]. EXAM: PORTABLE CHEST 1 VIEW COMPARISON:  02/10/2024. FINDINGS: There is new left retrocardiac opacity obscuring the left hemidiaphragm, descending thoracic aorta and blunting the left lateral  costophrenic angle, suggesting left lung atelectasis and/or consolidation with pleural effusion. Bilateral lung fields are otherwise clear. Right lateral costophrenic angle is clear. Stable cardio-mediastinal silhouette. Mitral annuloplasty noted. There is presumed left atrial appendage closure device. Sternotomy wires seen. No acute osseous abnormalities. The soft tissues are within normal limits. Interval removal of previously seen right IJ Swan-Ganz catheter. There is new right-sided PICC line with its tip overlying the cavoatrial junction region. No pneumothorax. IMPRESSION: 1. New left retrocardiac opacity, as described above. 2. Interval removal of previously seen right IJ Swan-Ganz catheter. 3. There is new right-sided PICC line with its tip overlying the cavoatrial junction region. No pneumothorax. Electronically Signed   By: Beula Brunswick M.D.   On: 02/15/2024 14:08   US  EKG SITE RITE Result Date: 02/15/2024 If Site Rite image not attached, placement could not be confirmed due to current cardiac rhythm.    Assessment/Plan: S/P Procedure(s) (LRB): MITRAL VALVE REPAIR USING A SIMULUS SEMI-RIGID ANNULOPLASTY BAND (N/A) MAZE PROCEDURE (N/A) CLIPPING, LEFT ATRIAL APPENDAGE USING A MEDTRONIC CLIP (N/A) ECHOCARDIOGRAM, TRANSESOPHAGEAL (N/A) CV-A fib with RVR. On Amiodarone and Milrinone drips and Apixaban 5 mg bid. Per Dr. [name], give another IV Amiodarone bolus. Co ox this am 62.9. Pulmonary-on room air. Encourage incentive spirometer. Acute systolic heart failure-on Lasix drip, Unna boots. Has PICC line for CVP (and co ox) Expected post op blood loss anemia-H and H this am 8.9 and 27.6 CBGs 110/128/111. Pre op HGA1C 4.7. 5. Leukocytosis-WBC this am slightly increased to 16,500. ? Etiology. No sign of wound infection. She was given 5 days of Zosyn. Atelectasis on CXR.  6. Stop stool softeners   Attila Mccarthy Collene Dawson PA-C 02/16/2024 7:47 AM

## 2024-02-16 NOTE — Evaluation (Signed)
 Clinical/Bedside Swallow Evaluation Patient Details  Name: [name] MRN: [id number] Date of Birth: 1941-10-15  Today's Date: 02/16/2024 Time: SLP Start Time (ACUTE ONLY): 1407 SLP Stop Time (ACUTE ONLY): 1429 SLP Time Calculation (min) (ACUTE ONLY): 22 min  Past Medical History:  Past Medical History:  Diagnosis Date   Acid reflux    Anemia    Arthritis    Atrial fibrillation (HCC)    Cataract    Diverticulosis of colon    Endometrial cancer (HCC)    1A grade endometrial ca   Essential hypertension 01/24/2009   Qualifier: Diagnosis of  By: Nelson-Smith CMA (AAMA), Dottie     Hx of radiation therapy 04/24/08-05/29/08& 8/12/,8/19,&06/27/08   external beam  through 7/09 ,then intracavity in 06/2008   Hypertension    Mitral valve prolapse    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   Tricuspid regurgitation    Past Surgical History:  Past Surgical History:  Procedure Laterality Date   APPENDECTOMY     1967   arthroscopic knee right surgery     CLIPPING OF ATRIAL APPENDAGE N/A 02/08/2024   Procedure: CLIPPING, LEFT ATRIAL APPENDAGE USING A MEDTRONIC CLIP;  Surgeon: Melene Sportsman, MD;  Location: [facility] OR;  Service: Open Heart Surgery;  Laterality: N/A;   ECTOPIC PREGNANCY SURGERY  1967   iron infusion  12/2022   LAPAROSCOPIC HYSTERECTOMY     total   MAZE N/A 02/08/2024   Procedure: MAZE PROCEDURE;  Surgeon: Melene Sportsman, MD;  Location: [facility] OR;  Service: Open Heart Surgery;  Laterality: N/A;   MITRAL VALVE REPAIR N/A 02/08/2024   Procedure: MITRAL VALVE REPAIR USING A SIMULUS SEMI-RIGID ANNULOPLASTY BAND;  Surgeon: Melene Sportsman, MD;  Location: [facility] OR;  Service: Open Heart Surgery;  Laterality: N/A;  possible replacement   ORTHOPEDIC SURGERY     Orthoscopic Knee Surgery   RIGHT/LEFT HEART CATH AND CORONARY ANGIOGRAPHY N/A 12/30/2023   Procedure: RIGHT/LEFT HEART CATH AND CORONARY ANGIOGRAPHY;  Surgeon: [name], MD;  Location: [facility] INVASIVE CV LAB;  Service: Cardiovascular;   Laterality: N/A;   TEAR DUCT PROBING     TEE WITHOUT CARDIOVERSION N/A 02/08/2024   Procedure: ECHOCARDIOGRAM, TRANSESOPHAGEAL;  Surgeon: Melene Sportsman, MD;  Location[facility] OR;  Service: Open Heart Surgery;  Laterality: N/A;   TOOTH EXTRACTION     bone graft   TRANSESOPHAGEAL ECHOCARDIOGRAM (CATH LAB) N/A 12/30/2023   Procedure: TRANSESOPHAGEAL ECHOCARDIOGRAM;  Surgeon: Euell Herrlich, MD;  Location: [facility] INVASIVE CV LAB;  Service: Cardiovascular;  Laterality: N/A;   HPI:  83 y.o. female presents to [facility] 02/08/24 for complex MVR and MAZE (via sternotomy). Intubated for <24 hours. Per RN, pt has been coughing intermittently with PO intake, throat-clearing continuously. She c/o difficulty chewing solid foods. SLP consulted 4/17. PMHx: anemia, HTN, mitral valve prolapse, moderate mitral regurgitation, tricuspid regurgitation    Assessment / Plan / Recommendation  Clinical Impression  Pt participated in clinical swallowing assessment. Oral mechanism is normal; no focal deficits. Voice is hoarse and has been this way since procedure last week. She presents with constant throat-clearing, before and during PO intake, which she reports has also been present since procedure. There is no acute coughing associated with PO trials outside of the chronic throat-clearing.  There are no obvious concerns for aspiration. DG chest one view 4/16 suggests left lung atelectasis and/or consolidation with pleural effusion. Recommend continuing current diet; SLP will follow and potentially move forward with FEES or MBS to assess swallow physiology. SLP Visit Diagnosis: Dysphagia,  unspecified (R13.10)    Aspiration Risk    unknown   Diet Recommendation   Thin;Age appropriate regular  Medication Administration: Whole meds with liquid    Other  Recommendations Oral Care Recommendations: Oral care BID    Recommendations for follow up therapy are one component of a multi-disciplinary discharge planning process, led by the  attending physician.  Recommendations may be updated based on patient status, additional functional criteria and insurance authorization.  Follow up Recommendations No SLP follow up              Frequency and Duration min 2x/week  1 week       Prognosis Prognosis for improved oropharyngeal function: Good      Swallow Study   General Date of Onset: 02/08/24 HPI: 83 y.o. female presents [facility] 02/08/24 for complex MVR and MAZE (via sternotomy). Intubated for <24 hours. Per RN, pt has been coughing intermittently with PO intake, throat-clearing continuously. She c/o difficulty chewing solid foods. SLP consulted 4/17. PMHx: anemia, HTN, mitral valve prolapse, moderate mitral regurgitation, tricuspid regurgitation Type of Study: Bedside Swallow Evaluation Previous Swallow Assessment: no Diet Prior to this Study: Regular;Thin liquids (Level 0) Temperature Spikes Noted: No Respiratory Status: Room air History of Recent Intubation: Yes Total duration of intubation (days): 1 days Date extubated: 02/08/24 Behavior/Cognition: Alert;Cooperative;Pleasant mood Oral Cavity Assessment: Within Functional Limits Oral Care Completed by SLP: No Oral Cavity - Dentition: Adequate natural dentition Vision: Functional for self-feeding Self-Feeding Abilities: Able to feed self Patient Positioning: Upright in chair Baseline Vocal Quality: Hoarse Volitional Cough: Strong Volitional Swallow: Able to elicit    Oral/Motor/Sensory Function Overall Oral Motor/Sensory Function: Within functional limits   Ice Chips Ice chips: Within functional limits   Thin Liquid Thin Liquid: Within functional limits    Nectar Thick Nectar Thick Liquid: Not tested   Honey Thick Honey Thick Liquid: Not tested   Puree Puree: Within functional limits   Solid     Solid: Within functional limits      Myna Asal Laurice 02/16/2024,2:47 PM  Mylinda Asa L. Beatris Lincoln, MA CCC/SLP Clinical Specialist - Acute Care SLP Acute  Rehabilitation Services Office number [phone]

## 2024-02-16 NOTE — Consult Note (Signed)
 WOC Nurse Consult Note: Reason for Consult: DTI sacrum  Wound type: Deep Tissue Pressure Injury sacrum/medial buttock  Pressure Injury POA: no  Measurement: see nursing flowsheet  Wound bed: purple maroon discoloration  Drainage (amount, consistency, odor) dry  Periwound: intact  Dressing procedure/placement/frequency: Cleanse sacrum/buttocks with soap and water, dry and apply Xeroform gauze ([employer] #[id number]) to purple maroon discoloration daily.  Cover with silicone foam.    POC discussed with bedside nurse.  WOC team will not follow. Re-consult if further needs arise.   Thank you,    Ronni Colace MSN, RN-BC, [employer] [phone]

## 2024-02-16 NOTE — Progress Notes (Addendum)
 Advanced Heart Failure Rounding Note  Cardiologist: Yates Decamp, MD  Chief Complaint: Acute systolic heart failure Subjective:    S/p MVR 02/08/24. Post-op complicated by persistent a fib RVR and volume overload.   Lasix gtt started yesterday. PICC placed with initial co-ox 54%. Started on IV milrinone to help while diuresing. Co-ox 63% today.   Diuresed 2.7L UOP. Got up to the bathroom 14 times yesterday and had PVR bladder scans showing >324mL most times.   SCr 1.34  Resting comfortably in chair. Denies CP/SOB. EKG with persistent a fib RVR.   Objective:   Weight Range: 77.4 kg Body mass index is 29.29 kg/m.   Vital Signs:   Temp:  [97.6 F (36.4 C)-98.4 F (36.9 C)] 98.4 F (36.9 C) (04/17 1500) Pulse Rate:  [113-143] 132 (04/17 1500) Resp:  [14-25] 25 (04/17 1500) BP: (88-127)/(56-95) 91/62 (04/17 1500) SpO2:  [90 %-99 %] 93 % (04/17 1500) Weight:  [77.4 kg] 77.4 kg (04/17 0500) Last BM Date : 02/15/24  Weight change: Filed Weights   02/14/24 0600 02/15/24 0415 02/16/24 0500  Weight: 79.1 kg 80.4 kg 77.4 kg    Intake/Output:   Intake/Output Summary (Last 24 hours) at 02/16/2024 1628 Last data filed at 02/16/2024 1500 Gross per 24 hour  Intake 1151.78 ml  Output 3160 ml  Net -2008.22 ml      Physical Exam    General:  elderly appearing.  No respiratory difficulty. Sitting up in chair.  Neck: supple. JVD elevated.  Cor: PMI nondisplaced. Irregular rate & rhythm. No rubs, gallops or murmurs. Lungs: clear Abdomen: soft, nontender, nondistended. Good bowel sounds. Extremities: no cyanosis, clubbing, rash, +2-3 BLE edema. + UNNA boots. PICC RUE Neuro: alert & oriented x 3. Moves all 4 extremities w/o difficulty. Affect pleasant.   Telemetry   A fib RVR 120s  EKG    No new EKG to review  Labs    CBC Recent Labs    02/15/24 0241 02/16/24 0400  WBC 14.5* 16.5*  HGB 9.0* 8.9*  HCT 27.3* 27.6*  MCV 91.3 92.0  PLT 242 312   Basic Metabolic  Panel Recent Labs    02/15/24 0241 02/15/24 1743 02/16/24 0400  NA 132* 129* 129*  K 4.0 2.9* 3.6  CL 98 92* 89*  CO2 21* 24 23  GLUCOSE 106* 203* 167*  BUN 24* 24* 24*  CREATININE 1.29* 1.28* 1.34*  CALCIUM 6.7* 7.1* 7.0*  MG 2.9*  --  2.1   Liver Function Tests No results for input(s): "AST", "ALT", "ALKPHOS", "BILITOT", "PROT", "ALBUMIN" in the last 72 hours. No results for input(s): "LIPASE", "AMYLASE" in the last 72 hours. Cardiac Enzymes No results for input(s): "CKTOTAL", "CKMB", "CKMBINDEX", "TROPONINI" in the last 72 hours.  BNP: BNP (last 3 results) No results for input(s): "BNP" in the last 8760 hours.  ProBNP (last 3 results) No results for input(s): "PROBNP" in the last 8760 hours.   D-Dimer No results for input(s): "DDIMER" in the last 72 hours. Hemoglobin A1C No results for input(s): "HGBA1C" in the last 72 hours. Fasting Lipid Panel No results for input(s): "CHOL", "HDL", "LDLCALC", "TRIG", "CHOLHDL", "LDLDIRECT" in the last 72 hours. Thyroid Function Tests No results for input(s): "TSH", "T4TOTAL", "T3FREE", "THYROIDAB" in the last 72 hours.  Invalid input(s): "FREET3"  Other results:   Imaging    No results found.   Medications:     Scheduled Medications:  amiodarone  150 mg Intravenous Once   apixaban  5 mg Oral  BID   aspirin  81 mg Oral Daily   bethanechol  5 mg Oral TID   Chlorhexidine Gluconate Cloth  6 each Topical Daily   ezetimibe  10 mg Oral Daily   feeding supplement  1 Container Oral TID BM   insulin aspart  2-6 Units Subcutaneous Q4H   pantoprazole  40 mg Oral Daily   sodium chloride flush  10-40 mL Intracatheter Q12H   sodium chloride flush  3 mL Intravenous Q12H   sodium chloride flush  3-10 mL Intravenous Q12H   traMADol  50 mg Oral Q6H    Infusions:  amiodarone 30 mg/hr (02/16/24 1500)   furosemide (LASIX) 200 mg in dextrose 5 % 100 mL (2 mg/mL) infusion 8 mg/hr (02/16/24 1500)   milrinone 0.125 mcg/kg/min  (02/16/24 1500)    PRN Medications: dextrose, metoprolol tartrate, morphine injection, mouth rinse, oxyCODONE, phenol, sodium chloride flush, sodium chloride flush, sodium chloride flush    Patient Profile   [name] is a 83 y.o. female with HTN and PAF. Admitted with SOB> found to have severe MR. Now s/p MVR, MAZE and LAA clipping. AHF team consulted for acute systolic heart failure.   Assessment/Plan   Acute systolic heart failure, NiCM - Intra-op TEE 02/08/24 with EF 55-60% and severe MR - [facility] 2/25 with no CAD. RA 8, PA 41/17 (14), Fick CO/CI 7.76/4.41, PAPi 3, LVEDP 11 - Echo 02/14/24 with EF 35-40%, LV with GHK, RV normal, LA mod dilated, RA severely dilated, trivial MR, mod TR - Suspect drop in EF 2/2 persistent a fib RVR.  - NYHA II-IIIa today, IV on admission - Volume overloaded. Continue lasix gtt at 8. Has had good response so far. -2.7L UOP. CVP 15.  - Renal function relatively stable with diuresis.  - Continue UNNA boots - BP soft. Suspect we can add GDMT once out of a fib RVR - Strict I&O, daily weights    Atrial fibrillation  - S/p Maze and LAA clipping 4/25 - Remains in a fib RVR despite multiple amiodarone boluses - Continue amiodarone gtt. Increase rate to 60 with ongoing milrinone.  - NPO at midnight. Plan for DCCV tomorrow. Plan discussed with Dr. [name] and Dr. Trecia Maring.  - Continue eliquis 5 mg BID   Severe MR s/p MVR - Intra-op TEE with severe MR 02/08/24 - POD 8 - Trivial MR on echo 02/14/24   HTN - SBP soft but stable - Will follow with diuresis - Avoid hypotension  5. Urine retention - Multiple bladder scans overnight with >361mL in bladder post voiding - Place foley for acute retention while diuresing.    Length of Stay: 8  Sheryl Donna, NP  02/16/2024, 4:28 PM  Advanced Heart Failure Team Pager [phone] (M-F; 7a - 5p)  Please contact [facility] for night-coverage after hours (5p -7a ) and weekends on [email or web address]  Patient seen and  examined with the above-signed Advanced Practice Provider and/or Housestaff. I personally reviewed laboratory data, imaging studies and relevant notes. I independently examined the patient and formulated the important aspects of the plan. I have edited the note to reflect any of my changes or salient points. I have personally discussed the plan with the patient and/or family.  Remains on milrinone and lasix gtt. Diuresing well but struggling with retention and overflow incontinence. Remains in AF with RVR despite amio gtt.   Breathing better.   General:  Elderly woman sitting in chair  No resp difficulty HEENT: normal Neck: supple. JVP to  ear. Carotids 2+ bilat; no bruits. No lymphadenopathy or thryomegaly appreciated. Cor: sternal wound ok. Irr tachy  Lungs: clear Abdomen: soft, nontender, nondistended. No hepatosplenomegaly. No bruits or masses. Good bowel sounds. Extremities: no cyanosis, clubbing, rash, 3+ edema + UNNA Neuro: alert & orientedx3, cranial nerves grossly intact. moves all 4 extremities w/o difficulty. Affect pleasant  Continue milrinone for inotropic support. Continue IV diuresis. Still has way to go. Using Foley not ideal with fresh MV repair but currently have no choice.   Continue IV amio for AF with RVR. Now on Eliquis. D/w TCTS and CCM will plan DC-CV tomorrow. NPO after MN.   Jules Oar, MD  5:37 PM

## 2024-02-16 NOTE — Progress Notes (Signed)
 PCCM Brief Note  Notified by RN of dysphagia to pills, liquids, lunch with frequent breaks and coughing spells.  Will ask SLP to assist with formal swallow eval.   Rafael Bun, PA - [facility] For pager details, please see AMION or use Epic chat  After 1900, please call [facility] for cross coverage needs 02/16/2024, 12:37 PM

## 2024-02-16 NOTE — Progress Notes (Signed)
 NAME:  JANYRA BARILLAS, MRN:  [id number], DOB:  1941/10/05, LOS: 8 ADMISSION DATE:  02/08/2024, CONSULTATION DATE:/07/2024 REFERRING MD: Eugenio Hoes, CHIEF COMPLAINT:/07/2024  History of Present Illness:  83 year old female with hypertension, paroxysmal A-fib who initially presented with increasing shortness of breath and bilateral leg swelling, noted to have severe mitral valve regurgitation and moderate tricuspid valve regurgitation.  Her ejection fraction was 60%, she underwent evaluation with cardiac cath which showed no coronary artery disease, today she underwent mitral valve repair with maze procedure.  Patient remained intubated and was transferred to ICU postop.  PCCM was consulted for help evaluation medical management  Pertinent  Medical History   Past Medical History:  Diagnosis Date   Acid reflux    Anemia    Arthritis    Atrial fibrillation (HCC)    Cataract    Diverticulosis of colon    Endometrial cancer (HCC)    1A grade endometrial ca   Essential hypertension 01/24/2009   Qualifier: Diagnosis of  By: Nelson-Smith CMA (AAMA), Dottie     Hx of radiation therapy 04/24/08-05/29/08& 8/12/,8/19,&06/27/08   external beam  through 7/09 ,then intracavity in 06/2008   Hypertension    Mitral valve prolapse    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   Tricuspid regurgitation      Significant Hospital Events: Including procedures, antibiotic start and stop dates in addition to other pertinent events   4/9 Mitral valve repair s/p Maze procedure, extubated 4/10 Afib RVR on amio gtt, requiring pressors  4/11 A-fib controlled with Amio drip, weaning off pressors, still having some nausea secondary to vasoplegia but improving, up to chair, plan to walk later-overall progressing 4/14 changing amio to PO after bolus, changing to PO eliquis, introducer wires and foley to dc   Interim History / Subjective:  Doing well. Remains edematous. +3.5L positive despite almost 3L UOP overnight.  Weight up ~20lbs Coox 62.  Objective   Blood pressure 90/69, pulse (!) 140, temperature 98.1 F (36.7 C), temperature source Oral, resp. rate (!) 22, height 5\' 4"  (1.626 m), weight 77.4 kg, SpO2 93%. CVP:  [10 mmHg-16 mmHg] 10 mmHg      Intake/Output Summary (Last 24 hours) at 02/16/2024 0809 Last data filed at 02/16/2024 0700 Gross per 24 hour  Intake 1180.52 ml  Output 2735 ml  Net -1554.48 ml   Filed Weights   02/14/24 0600 02/15/24 0415 02/16/24 0500  Weight: 79.1 kg 80.4 kg 77.4 kg   Examination: General: very pleasant elderly F NAD, sitting up in chair HEENT:  NCAT pink mm  Cardiovascular: Fuller Song Pulmonary: even unlabored symmetrical chest expansion  Abdomen: ndnt  Skin: cdw Extremities: no acute joint deformity. Extremity edema  GU: clear urine  Resolved Hospital Problem list   AcuPost-op vasoplegia te respiratory insufficiency, postop-extubated on 4/9   Assessment & Plan:    Severe MV s/p repair Afib s/p MAZE  Acute HFrEF  P -HF following, appreciate the assistance -cont amio, Converted for several hours after 3rd amio bolus  + a few g mag 4/15, back in fib 4/16. Wonder of she might need a different agent like dig.  -eliquis  -ASA, statin  -lasix gtt + milrinone, push aggressive diuresis -strict I/O, weights -multimodal analgesia  -mobility  -optimize lytes   N/v  P -dc scop patch -lytes ok replace as needed  -PRN zofran  -diet as tolerated   AKI Urinary retention   P -dc scop patch start urecholine -BID lasix + metolazone - bladder scan q6  for 2d   Leukocytosis -unclear etiology, is improving w abx so will continue but no clear infectious source of yet -think this is probably reactive  P -zosyn x 5d  -follow bcx, WBC and fever curve   Hx HTN HLD -zetia  -as above lasix   Best Practice (right click and "Reselect all SmartList Selections" daily)   Diet/type: advance as tolerated  DVT prophylaxis: eliquis  GI prophylaxis:  PPI Lines: na  Foley: na Code Status:  full code Last date of multidisciplinary goals of care discussion [per primary  ]   Rafael Bun, PA - [facility] For pager details, please see AMION or use Epic chat  After 1900, please call [facility] for cross coverage needs 02/16/2024, 8:22 AM

## 2024-02-16 NOTE — Plan of Care (Signed)

## 2024-02-17 ENCOUNTER — Inpatient Hospital Stay (HOSPITAL_COMMUNITY)

## 2024-02-17 DIAGNOSIS — I5021 Acute systolic (congestive) heart failure: Secondary | ICD-10-CM | POA: Diagnosis not present

## 2024-02-17 DIAGNOSIS — N179 Acute kidney failure, unspecified: Secondary | ICD-10-CM | POA: Diagnosis not present

## 2024-02-17 DIAGNOSIS — E871 Hypo-osmolality and hyponatremia: Secondary | ICD-10-CM | POA: Diagnosis not present

## 2024-02-17 DIAGNOSIS — E876 Hypokalemia: Secondary | ICD-10-CM | POA: Diagnosis not present

## 2024-02-17 DIAGNOSIS — I10 Essential (primary) hypertension: Secondary | ICD-10-CM | POA: Diagnosis not present

## 2024-02-17 DIAGNOSIS — I340 Nonrheumatic mitral (valve) insufficiency: Secondary | ICD-10-CM | POA: Diagnosis not present

## 2024-02-17 LAB — MAGNESIUM
Magnesium: 1.9 mg/dL (ref 1.7–2.4)
Magnesium: 2.1 mg/dL (ref 1.7–2.4)

## 2024-02-17 LAB — BASIC METABOLIC PANEL WITH GFR
Anion gap: 11 (ref 5–15)
Anion gap: 13 (ref 5–15)
BUN: 26 mg/dL — ABNORMAL HIGH (ref 8–23)
BUN: 26 mg/dL — ABNORMAL HIGH (ref 8–23)
CO2: 27 mmol/L (ref 22–32)
CO2: 27 mmol/L (ref 22–32)
Calcium: 7.2 mg/dL — ABNORMAL LOW (ref 8.9–10.3)
Calcium: 7.2 mg/dL — ABNORMAL LOW (ref 8.9–10.3)
Chloride: 92 mmol/L — ABNORMAL LOW (ref 98–111)
Chloride: 90 mmol/L — ABNORMAL LOW (ref 98–111)
Creatinine, Ser: 1.48 mg/dL — ABNORMAL HIGH (ref 0.44–1.00)
Creatinine, Ser: 1.54 mg/dL — ABNORMAL HIGH (ref 0.44–1.00)
GFR, Estimated: 36 mL/min — ABNORMAL LOW (ref >60)
GFR, Estimated: 34 mL/min — ABNORMAL LOW (ref >60)
Glucose, Bld: 98 mg/dL (ref 70–99)
Glucose, Bld: 218 mg/dL — ABNORMAL HIGH (ref 70–99)
Potassium: 2.9 mmol/L — ABNORMAL LOW (ref 3.5–5.1)
Potassium: 3.5 mmol/L (ref 3.5–5.1)
Sodium: 130 mmol/L — ABNORMAL LOW (ref 135–145)
Sodium: 130 mmol/L — ABNORMAL LOW (ref 135–145)

## 2024-02-17 LAB — GLUCOSE, CAPILLARY
Glucose-Capillary: 114 mg/dL — ABNORMAL HIGH (ref 70–99)
Glucose-Capillary: 116 mg/dL — ABNORMAL HIGH (ref 70–99)
Glucose-Capillary: 135 mg/dL — ABNORMAL HIGH (ref 70–99)
Glucose-Capillary: 140 mg/dL — ABNORMAL HIGH (ref 70–99)
Glucose-Capillary: 147 mg/dL — ABNORMAL HIGH (ref 70–99)
Glucose-Capillary: 176 mg/dL — ABNORMAL HIGH (ref 70–99)
Glucose-Capillary: 98 mg/dL (ref 70–99)

## 2024-02-17 LAB — CBC
HCT: 25.8 % — ABNORMAL LOW (ref 36.0–46.0)
Hemoglobin: 8.4 g/dL — ABNORMAL LOW (ref 12.0–15.0)
MCH: 29.8 pg (ref 26.0–34.0)
MCHC: 32.6 g/dL (ref 30.0–36.0)
MCV: 91.5 fL (ref 80.0–100.0)
Platelets: 320 10*3/uL (ref 150–400)
RBC: 2.82 MIL/uL — ABNORMAL LOW (ref 3.87–5.11)
RDW: 19.9 % — ABNORMAL HIGH (ref 11.5–15.5)
WBC: 15.9 10*3/uL — ABNORMAL HIGH (ref 4.0–10.5)
nRBC: 0.3 % — ABNORMAL HIGH (ref 0.0–0.2)

## 2024-02-17 LAB — COOXEMETRY PANEL
Carboxyhemoglobin: 2 % — ABNORMAL HIGH (ref 0.5–1.5)
Methemoglobin: <0.7 % (ref 0.0–1.5)
O2 Saturation: 69.4 %
Total hemoglobin: 8.7 g/dL — ABNORMAL LOW (ref 12.0–16.0)

## 2024-02-17 MED ORDER — FENTANYL CITRATE PF 50 MCG/ML IJ SOSY
100.0000 ug | PREFILLED_SYRINGE | Freq: Once | INTRAMUSCULAR | Status: DC
  Filled 2024-02-17: qty 2

## 2024-02-17 MED ORDER — DIGOXIN 0.25 MG/ML IJ SOLN
0.2500 mg | Freq: Four times a day (QID) | INTRAMUSCULAR | Status: AC
  Administered 2024-02-17 – 2024-02-18 (×3): 0.25 mg via INTRAVENOUS
  Filled 2024-02-17 (×3): qty 2

## 2024-02-17 MED ORDER — PROPOFOL 10 MG/ML IV BOLUS
100.0000 mg | Freq: Once | INTRAVENOUS | Status: AC
  Administered 2024-02-17: 40 mg via INTRAVENOUS
  Filled 2024-02-17: qty 20

## 2024-02-17 MED ORDER — POTASSIUM CHLORIDE 20 MEQ PO PACK: 40.0000 meq | PACK | ORAL | Status: DC

## 2024-02-17 MED ORDER — POTASSIUM CHLORIDE 20 MEQ PO PACK: 40.0000 meq | PACK | Freq: Two times a day (BID) | ORAL | Status: DC

## 2024-02-17 MED ORDER — POTASSIUM CHLORIDE 20 MEQ PO PACK
40.0000 meq | PACK | ORAL | Status: DC
  Filled 2024-02-17: qty 2

## 2024-02-17 MED ORDER — MAGNESIUM SULFATE 2 GM/50ML IV SOLN
2.0000 g | Freq: Once | INTRAVENOUS | Status: AC
  Administered 2024-02-17: 2 g via INTRAVENOUS
  Filled 2024-02-17: qty 50

## 2024-02-17 MED ORDER — POTASSIUM CHLORIDE CRYS ER 20 MEQ PO TBCR
40.0000 meq | EXTENDED_RELEASE_TABLET | ORAL | Status: DC
Start: 2024-02-17 — End: 2024-02-17

## 2024-02-17 MED ORDER — POTASSIUM CHLORIDE 20 MEQ PO PACK
40.0000 meq | PACK | Freq: Once | ORAL | Status: AC
  Administered 2024-02-17: 40 meq via ORAL
  Filled 2024-02-17: qty 2

## 2024-02-17 MED ORDER — POTASSIUM CHLORIDE 10 MEQ/50ML IV SOLN
10.0000 meq | INTRAVENOUS | Status: DC
  Administered 2024-02-17 (×3): 10 meq via INTRAVENOUS
  Filled 2024-02-17 (×3): qty 50

## 2024-02-17 MED ORDER — POTASSIUM CHLORIDE 10 MEQ/50ML IV SOLN
10.0000 meq | INTRAVENOUS | Status: AC
Start: 2024-02-17 — End: 2024-02-17
  Administered 2024-02-17 (×3): 10 meq via INTRAVENOUS
  Filled 2024-02-17 (×3): qty 50

## 2024-02-17 MED ORDER — POTASSIUM CHLORIDE CRYS ER 20 MEQ PO TBCR
40.0000 meq | EXTENDED_RELEASE_TABLET | ORAL | Status: AC
  Administered 2024-02-17 (×2): 40 meq via ORAL
  Filled 2024-02-17 (×2): qty 2

## 2024-02-17 MED ORDER — PHENYLEPHRINE 80 MCG/ML (10ML) SYRINGE FOR IV PUSH (FOR BLOOD PRESSURE SUPPORT)
0.0000 ug | PREFILLED_SYRINGE | Freq: Once | INTRAVENOUS | Status: AC | PRN
  Administered 2024-02-17: 80 ug via INTRAVENOUS

## 2024-02-17 NOTE — Plan of Care (Signed)

## 2024-02-17 NOTE — Progress Notes (Signed)
 Physical Therapy Treatment Patient Details Name: [name] MRN: [id number] DOB: Jun 21, 1941 Today's Date: 02/17/2024   History of Present Illness 83 y.o. female presents to [facility] 02/08/24 for complex MVR and MAZE (via sternotomy). Post-op complicated by persistent a fib RVR and volume overload. Plan for cardioversion 4/18. PMHx: anemia, HTN, mitral valve prolapse, moderate mitral regurgitation, tricuspid regurgitation    PT Comments  Pt in recliner upon arrival and agreeable to PT session. Limited PT session due to pt's heart rate elevating with minimal activity. Pt was able to complete a few LE exercises with increased rest time in between sets. HR varied from 120 BPM-142 BPM with pt reporting feeling fatigued. Anticipate pt will progress well with continued medical improvement. Acute PT to follow.      If plan is discharge home, recommend the following: A little help with walking and/or transfers;A little help with bathing/dressing/bathroom;Assistance with cooking/housework;Assist for transportation;Help with stairs or ramp for entrance   Can travel by private vehicle      Yes  Equipment Recommendations  None recommended by PT       Precautions / Restrictions Precautions Precautions: Fall;Sternal Recall of Precautions/Restrictions: Impaired Precaution/Restrictions Comments: cues to not use B UE when scooting forwards, watch HR Restrictions Other Position/Activity Restrictions: sternal     Mobility  Bed Mobility    General bed mobility comments: in recliner upon arrival    Transfers Overall transfer level: Needs assistance Equipment used: None Transfers: Sit to/from Stand Sit to Stand: Min assist  General transfer comment: MinA for boost-up, pt uses momentum and hands on knees    Ambulation/Gait    General Gait Details: deferred 2/2 HR elevating higher than 140 BPM with standing and light exercises    Balance Overall balance assessment: Needs assistance Sitting-balance  support: No upper extremity supported, Feet supported Sitting balance-Leahy Scale: Fair     Standing balance support: No upper extremity supported Standing balance-Leahy Scale: Fair Standing balance comment: able to stand statically with no AD       Communication Communication Communication: Impaired Factors Affecting Communication: Hearing impaired  Cognition Arousal: Alert Behavior During Therapy: WFL for tasks assessed/performed, Flat affect   PT - Cognitive impairments: No apparent impairments    Following commands: Intact      Cueing Cueing Techniques: Verbal cues, Tactile cues  Exercises General Exercises - Lower Extremity Ankle Circles/Pumps: AROM, Both, 10 reps, Seated Long Arc Quad: AROM, Both, 5 reps, Seated Heel Slides: AROM, Both, 5 reps, Supine Hip Flexion/Marching: AROM, Both, 5 reps, Seated        Pertinent Vitals/Pain Pain Assessment Pain Assessment: Faces Faces Pain Scale: Hurts little more Pain Location: incision Pain Descriptors / Indicators: Sore Pain Intervention(s): Limited activity within patient's tolerance, Monitored during session, Repositioned     PT Goals (current goals can now be found in the care plan section) Acute Rehab PT Goals PT Goal Formulation: With patient/family Time For Goal Achievement: 02/24/24 Potential to Achieve Goals: Good Progress towards PT goals: Progressing toward goals    Frequency    Min 2X/week       AM-PAC PT "6 Clicks" Mobility   Outcome Measure  Help needed turning from your back to your side while in a flat bed without using bedrails?: A Little Help needed moving from lying on your back to sitting on the side of a flat bed without using bedrails?: A Little Help needed moving to and from a bed to a chair (including a wheelchair)?: A Little Help needed standing  up from a chair using your arms (e.g., wheelchair or bedside chair)?: A Little Help needed to walk in hospital room?: A Little Help needed  climbing 3-5 steps with a railing? : A Little 6 Click Score: 18    End of Session   Activity Tolerance: Treatment limited secondary to medical complications (Comment) (elevated HR) Patient left: in chair;with call bell/phone within reach Nurse Communication: Mobility status;Other (comment) (HR) PT Visit Diagnosis: Unsteadiness on feet (R26.81);Muscle weakness (generalized) (M62.81);Other abnormalities of gait and mobility (R26.89)     Time: 9147-8295 PT Time Calculation (min) (ACUTE ONLY): 14 min  Charges:    $Therapeutic Exercise: 8-22 mins PT General Charges $$ ACUTE PT VISIT: 1 Visit                    [name], PT, DPT Secure Chat Preferred  Rehab Office [phone]   [name] 02/17/2024, 11:48 AM

## 2024-02-17 NOTE — Plan of Care (Signed)
 Attempted cardioversion x 2 today unsuccessful. OOB to chair x 2. Marched in place. HR too high to walk and patient states that she felt weak today. Sacrum with pressure injury-patient made aware to stay turned in bed and to sit for 2-3 hours at a time then go back to bed.

## 2024-02-17 NOTE — Progress Notes (Signed)
 [address]       [place] [address]             [phone]      9 Days Post-Op  Procedure(s) (LRB): MITRAL VALVE REPAIR USING A SIMULUS SEMI-RIGID ANNULOPLASTY BAND (N/A) MAZE PROCEDURE (N/A) CLIPPING, LEFT ATRIAL APPENDAGE USING A MEDTRONIC CLIP (N/A) ECHOCARDIOGRAM, TRANSESOPHAGEAL (N/A)   Total Length of Stay:  LOS: 9 days    SUBJECTIVE: Had some nausea last  night Not ambulated secondary to Afib  Vitals:   02/17/24 0400 02/17/24 0500  BP: 95/65 103/75  Pulse: (!) 146 (!) 120  Resp: (!) 34 12  Temp:    SpO2: 94% 93%    Intake/Output      04/17 0701 04/18 0700 04/18 0701 04/19 0700   I.V. (mL/kg) 889.9 (11.6)    IV Piggyback     Total Intake(mL/kg) 889.9 (11.6)    Urine (mL/kg/hr) 2325 (1.3)    Stool 1    Total Output 2326    Net -1436.1             amiodarone  60 mg/hr (02/17/24 0300)   furosemide  (LASIX ) 200 mg in dextrose  5 % 100 mL (2 mg/mL) infusion 8 mg/hr (02/17/24 0300)   magnesium  sulfate bolus IVPB 2 g (02/17/24 0624)   milrinone  0.125 mcg/kg/min (02/17/24 0300)   potassium chloride  10 mEq (02/17/24 0626)    CBC    Component Value Date/Time   WBC 15.9 (H) 02/17/2024 0357   RBC 2.82 (L) 02/17/2024 0357   HGB 8.4 (L) 02/17/2024 0357   HGB 11.1 (L) 12/05/2023 0803   HGB 12.8 05/28/2008 1551   HCT 25.8 (L) 02/17/2024 0357   HCT 36.6 05/28/2008 1551   PLT 320 02/17/2024 0357   PLT 174 12/05/2023 0803   PLT 164 05/28/2008 1551   MCV 91.5 02/17/2024 0357   MCV 89.3 05/28/2008 1551   MCH 29.8 02/17/2024 0357   MCHC 32.6 02/17/2024 0357   RDW 19.9 (H) 02/17/2024 0357   RDW 13.0 05/28/2008 1551   LYMPHSABS 1.5 12/05/2023 0803   LYMPHSABS 0.4 (L) 05/28/2008 1551   MONOABS 0.7 12/05/2023 0803   MONOABS 0.8 05/28/2008 1551   EOSABS 0.3 12/05/2023 0803   EOSABS 0.3 05/28/2008 1551   BASOSABS 0.1 12/05/2023 0803   BASOSABS 0.0 05/28/2008 1551   CMP     Component Value Date/Time   NA 130 (L)  02/17/2024 0357   NA 139 12/16/2023 1638   K 2.9 (L) 02/17/2024 0357   CL 92 (L) 02/17/2024 0357   CO2 27 02/17/2024 0357   GLUCOSE 98 02/17/2024 0357   BUN 26 (H) 02/17/2024 0357   BUN 17 12/16/2023 1638   CREATININE 1.48 (H) 02/17/2024 0357   CALCIUM  7.2 (L) 02/17/2024 0357   PROT 6.1 (L) 02/07/2024 1042   PROT 6.4 04/29/2021 1102   ALBUMIN  3.8 02/07/2024 1042   ALBUMIN  4.4 04/29/2021 1102   AST 22 02/07/2024 1042   ALT 15 02/07/2024 1042   ALKPHOS 24 (L) 02/07/2024 1042   BILITOT 0.7 02/07/2024 1042   BILITOT 0.5 04/29/2021 1102   GFRNONAA 36 (L) 02/17/2024 0357   GFRAA >90 09/19/2011 0537   ABG    Component Value Date/Time   PHART 7.367 02/08/2024 1849   PCO2ART 41.3 02/08/2024 1849   PO2ART 130 (H) 02/08/2024 1849   HCO3 23.9 02/08/2024 1849   TCO2 25 02/08/2024 1849   ACIDBASEDEF 2.0 02/08/2024 1849   O2SAT  69.4 02/17/2024 0357   CBG (last 3)  Recent Labs    02/16/24 2023 02/17/24 0025 02/17/24 0356  GLUCAP 118* 176* 98  EXAM Lungs: overall clear Card: Tachy  no murmur Ext: warm still edematous but less Neuro: intact   ASSESSMENT: SP MV repair and MAZE Afib with RVR still not chemically converted. For cardioversion today Cr slowly rising. May need to lower lasix  drip On Milrinone  as per AHF Hopefully with NSR can get more ambulation today   Melene Sportsman, MD 02/17/2024

## 2024-02-17 NOTE — Progress Notes (Signed)
 NAME:  [name], MRN:  [id number], DOB:  04-May-1941, LOS: 9 ADMISSION DATE:  02/08/2024, CONSULTATION DATE:/07/2024 REFERRING MD: Melene Sportsman, CHIEF COMPLAINT:/07/2024  History of Present Illness:  83 year old female with hypertension, paroxysmal A-fib who initially presented with increasing shortness of breath and bilateral leg swelling, noted to have severe mitral valve regurgitation and moderate tricuspid valve regurgitation.  Her ejection fraction was 60%, she underwent evaluation with cardiac cath which showed no coronary artery disease, today she underwent mitral valve repair with maze procedure.  Patient remained intubated and was transferred to ICU postop.  PCCM was consulted for help evaluation medical management  Pertinent  Medical History   Past Medical History:  Diagnosis Date   Acid reflux    Anemia    Arthritis    Atrial fibrillation (HCC)    Cataract    Diverticulosis of colon    Endometrial cancer (HCC)    1A grade endometrial ca   Essential hypertension 01/24/2009   Qualifier: Diagnosis of  By: Nelson-Smith CMA (AAMA), Dottie     Hx of radiation therapy 04/24/08-05/29/08& 8/12/,8/19,&06/27/08   external beam  through 7/09 ,then intracavity in 06/2008   Hypertension    Mitral valve prolapse    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   Tricuspid regurgitation      Significant Hospital Events: Including procedures, antibiotic start and stop dates in addition to other pertinent events   4/9 Mitral valve repair s/p Maze procedure, extubated 4/10 Afib RVR on amio gtt, requiring pressors  4/11 A-fib controlled with Amio drip, weaning off pressors, still having some nausea secondary to vasoplegia but improving, up to chair, plan to walk later-overall progressing 4/14 changing amio to PO after bolus, changing to PO eliquis , introducer wires and foley to dc  4/16 slp for concern of dysphagia, picc placed, coox low, milrinone / lasix  gtt  Interim History / Subjective:    Coox 69% on milrinone  0.125, lasix  gtt 8mg /hr Remains in afib 120-130s on amio 60mg /hr CVP 15-16 Plans for DCCV today Reports slight nausea overnight, now resolved, denies vomiting, c/o palpations   Objective   Blood pressure 109/69, pulse (!) 138, temperature 98.1 F (36.7 C), temperature source Oral, resp. rate 14, height 5\' 4"  (1.626 m), weight 76.9 kg, SpO2 90%. CVP:  [7 mmHg-15 mmHg] 8 mmHg      Intake/Output Summary (Last 24 hours) at 02/17/2024 0844 Last data filed at 02/17/2024 0831 Gross per 24 hour  Intake 866.18 ml  Output 3801 ml  Net -2934.82 ml   Filed Weights   02/15/24 0415 02/16/24 0500 02/17/24 0500  Weight: 80.4 kg 77.4 kg 76.9 kg   Examination: General:  Elderly frail appearing female sitting upright in bedside chair in NAD HEENT: MM pink/moist, glasses, JVP to jaw Neuro: Alert, oriented, MAE CV: irir, afib 120-130s, healing sternal incision, RUE PICC PULM:  clear anteriorly, faint bibasilar rales L> R, RA 90-93% GI: soft, bs+, NT, foley- cyu Extremities: warm/dry, LE edema/ unna boots edema  Skin: no rashes   UOP 3.6L /24hrs 1 BM -2.7L /24hrs unmeasured urinary occurrences previously> net I/Os inaccurate Wtx 80.4> 77.4> 76.9kg  Resolved Hospital Problem list   AcuPost-op vasoplegia te respiratory insufficiency, postop-extubated on 4/9   Assessment & Plan:    Severe MV s/p repair 4/9 Afib s/p MAZE  Acute HFrEF  HTN HLD - course c/b afib with RVR, hypervolemia, acute HFrEF (suspected 2/2 afib w/RVR) P - per TCTS and HF following, appreciate the assistance - NPO.  plans for DCCV later  today after KCL repleted - cont eliquis   - ASA, statin  - trending CVP, lasix  gtt + milrinone  per HF> sCr slightly up> monitor closely and MAPs borderline at times, goal > 65 - strict I/O, weights -multimodal analgesia  - mobility per PT - cont aggressive optimization of electrolytes lytes, K > 4, Mag > 2 - zetia   N/v  P - monitor, small BM 4/17, cont  bowel regimen/ mobilize, replace electrolytes prn, mobilize - PRN zofran   - advance diet as tolerated after DCCV  AKI Urinary retention   Hypervolemic hyponatremia Hypokalemia P - day 3/urecholine , cont foley today - aggressive electrolyte replacement - lasix  + metolazone  as above> consider decreasing lasix  - serial renal indices/ I/Os  Leukocytosis -unclear etiology, remains afebrile, WBC stable, suspect reactive, no clear source P - completed zosyn  x 5d 4/16 - BCX neg, WBC and fever curve > stable  Dysphagia - hx of, s/p SLP 4/17, following, possible FEES or MBS pending, no obvious concerns for aspiration.  Remains NPO for DCCV   Best Practice (right click and "Reselect all SmartList Selections" daily)   Diet/type: per SLP DVT prophylaxis: eliquis   GI prophylaxis: PPI Lines: PICC  Foley: yes Code Status:  full code Last date of multidisciplinary goals of care discussion [per primary  ]      Early Glisson, MSN, AG-ACNP-BC [facility] 02/17/2024, 8:44 AM  See Amion for pager If no response to pager , please call [phone] until 7pm After 7:00 pm call Elink  [phone]

## 2024-02-17 NOTE — Progress Notes (Addendum)
 Advanced Heart Failure Rounding Note  Cardiologist: [name], MD  Chief Complaint: Acute systolic heart failure Subjective:    S/p MVR 02/08/24. Post-op complicated by persistent a fib RVR and volume overload.   Co-ox 70% CVP 15-16. Net negative 2.7L.On Milrinone  0.125 + Lasix  8/hr SBP 80-90s with AF in 120-130s on Amio 60/hr K2.9, Na 130. sCr up 1.34>1.48 Plan for DCCV today  Sitting up in chair. Feeling okay this morning. No SOB, CP.   Objective:    Weight Range: 76.9 kg Body mass index is 29.1 kg/m.   Vital Signs:   Temp:  [98 F (36.7 C)-98.4 F (36.9 C)] 98.1 F (36.7 C) (04/18 0359) Pulse Rate:  [116-148] 122 (04/18 0700) Resp:  [12-34] 29 (04/18 0700) BP: (80-113)/(51-82) 99/64 (04/18 0700) SpO2:  [88 %-96 %] 90 % (04/18 0700) Weight:  [76.9 kg] 76.9 kg (04/18 0500) Last BM Date : 02/16/24  Weight change: Filed Weights   02/15/24 0415 02/16/24 0500 02/17/24 0500  Weight: 80.4 kg 77.4 kg 76.9 kg   Intake/Output:  Intake/Output Summary (Last 24 hours) at 02/17/2024 0752 Last data filed at 02/17/2024 0600 Gross per 24 hour  Intake 889.88 ml  Output 3676 ml  Net -2786.12 ml    Physical Exam    CVP 15-16 General: Well appearing. No distress on RA Cardiac: JVP to jaw. S1 and S2 present. No murmurs or rub. Extremities: Warm and dry.  3+ BLE edema. + unna boots  Neuro: Alert and oriented x3. Affect pleasant. Moves all extremities without difficulty. Lines/Devices:   RUE PICC  Telemetry   AF RVR 120-130s (personally reviewed)  EKG    No new EKG to review  Labs    CBC Recent Labs    02/16/24 0400 02/17/24 0357  WBC 16.5* 15.9*  HGB 8.9* 8.4*  HCT 27.6* 25.8*  MCV 92.0 91.5  PLT 312 320   Basic Metabolic Panel Recent Labs    16/10/96 0400 02/17/24 0357  NA 129* 130*  K 3.6 2.9*  CL 89* 92*  CO2 23 27  GLUCOSE 167* 98  BUN 24* 26*  CREATININE 1.34* 1.48*  CALCIUM  7.0* 7.2*  MG 2.1 1.9   Imaging   No results  found.  Medications:    Scheduled Medications:  apixaban   5 mg Oral BID   aspirin   81 mg Oral Daily   Chlorhexidine  Gluconate Cloth  6 each Topical Daily   ezetimibe   10 mg Oral Daily   feeding supplement  1 Container Oral TID BM   insulin  aspart  2-6 Units Subcutaneous Q4H   loperamide  HCl  2 mg Oral Once   pantoprazole   40 mg Oral Daily   sodium chloride  flush  10-40 mL Intracatheter Q12H   sodium chloride  flush  3 mL Intravenous Q12H   sodium chloride  flush  3-10 mL Intravenous Q12H   traMADol   50 mg Oral Q6H   Infusions:  amiodarone  60 mg/hr (02/17/24 0300)   furosemide  (LASIX ) 200 mg in dextrose  5 % 100 mL (2 mg/mL) infusion 8 mg/hr (02/17/24 0300)   milrinone  0.125 mcg/kg/min (02/17/24 0300)   potassium chloride  10 mEq (02/17/24 0725)   PRN Medications: dextrose , metoprolol  tartrate, morphine  injection, ondansetron  (ZOFRAN ) IV, mouth rinse, oxyCODONE , phenol, sodium chloride  flush, sodium chloride  flush, sodium chloride  flush  Patient Profile   [name] is a 83 y.o. female with HTN and PAF. Admitted with SOB> found to have severe MR. Now s/p MVR, MAZE and LAA clipping. AHF team  consulted for acute systolic heart failure.   Assessment/Plan   Acute systolic heart failure, NiCM - Intra-op TEE 02/08/24 with EF 55-60% and severe MR - [facility] 2/25 with no CAD. RA 8, PA 41/17 (14), Fick CO/CI 7.76/4.41, PAPi 3, LVEDP 11 - Echo 4/25 with EF 35-40%, LV with GHK, RV normal, LA mod dilated, RA severely dilated, trivial MR, mod TR - Suspect drop in EF 2/2 persistent AF RVR.  - NYHA II-IIIa, IV on admission - Coox 70. CVP 15-16.Continue milrinone  0.125 and Lasix  gtt 8/hr.   - Continue UNNA boots - BP soft. Suspect we can add GDMT once out of AF - Strict I&O, daily weights    Atrial fibrillation  - S/p Maze and LAA clipping 4/25 - Remains in a fib RVR despite multiple amiodarone  boluses - Continue amiodarone  gtt. Continue amio 60/hr. - Continue eliquis  5 mg BID - Plan for  DCCV today   Severe MR s/p MVR - POD 9 - Intra-op TEE with severe MR 02/08/24 - Trivial MR on echo 02/14/24   HTN - SBP soft but stable - Will follow with diuresis - Avoid hypotension  5. Urine retention - Foley in place  6. AKI - baseline Cr normal - 1.34>1.48 today with hypotension overnight, suspect will improve with NSR and better BP - continue diuresis as above  7. Hypokalemia - K 2.9 supp aggressively prior to DC-CV  Length of Stay: 9  CRITICAL CARE Performed by: [place] Lee  Total critical care time: 15 minutes  Critical care time was exclusive of separately billable procedures and treating other patients.  Critical care was necessary to treat or prevent imminent or life-threatening deterioration.  Critical care was time spent personally by me on the following activities: development of treatment plan with patient and/or surrogate as well as nursing, discussions with consultants, evaluation of patient's response to treatment, examination of patient, obtaining history from patient or surrogate, ordering and performing treatments and interventions, ordering and review of laboratory studies, ordering and review of radiographic studies, pulse oximetry and re-evaluation of patient's condition.  [place] Lee, NP  02/17/2024, 7:52 AM  Advanced Heart Failure Team Pager [phone] (M-F; 7a - 5p)  Please contact [facility] for night-coverage after hours (5p -7a ) and weekends on [email or web address]  Agree with above.   Remains in AF with RVR. On milrinone  and IV lasix . Good diuresis. Denies SOB. BP soft  K 2.9. On eliquis . No bleeding  General: Elderly sitting in chair No resp difficulty HEENT: normal Neck: supple. JVP to ear . Carotids 2+ bilat; no bruits. No lymphadenopathy or thryomegaly appreciated. Cor: PMI nondisplaced. Irreg tachy  Lungs: clear Abdomen: soft, nontender, nondistended. No hepatosplenomegaly. No bruits or masses. Good bowel sounds. Extremities: no cyanosis,  clubbing, rash,3+  edema + UNNA Neuro: alert & orientedx3, cranial nerves grossly intact. moves all 4 extremities w/o difficulty. Affect pleasant   Continue IV diuresis with milrinone . Add NE as needed to keep MAP > = 70   Will plan DC-CV today Continue IV amio and Eliquis   Supp K.   Mobilize. Get Foley out as soon as possible.   CRITICAL CARE Performed by: Jules Oar  Total critical care time: 42 minutes  Critical care time was exclusive of separately billable procedures and treating other patients.  Critical care was necessary to treat or prevent imminent or life-threatening deterioration.  Critical care was time spent personally by me (independent of midlevel providers or residents) on the following activities: development of treatment plan with patient  and/or surrogate as well as nursing, discussions with consultants, evaluation of patient's response to treatment, examination of patient, obtaining history from patient or surrogate, ordering and performing treatments and interventions, ordering and review of laboratory studies, ordering and review of radiographic studies, pulse oximetry and re-evaluation of patient's condition.  Jules Oar, MD  9:49 AM

## 2024-02-17 NOTE — Progress Notes (Signed)
 SLP Cancellation Note  Patient Details Name: [name] MRN: [id number] DOB: 1941/04/07   Cancelled treatment:   Pt is NPO; plans for DCCV later today.  SLP will follow.    Loghan Kurtzman L. Beatris Lincoln, MA CCC/SLP Clinical Specialist - Acute Care SLP Acute Rehabilitation Services Office number [phone]       Myna Asal Laurice 02/17/2024, 9:39 AM

## 2024-02-17 NOTE — Progress Notes (Signed)
      [address]       [place], [address]             [phone]      POD # 9  Attempted Cv earlier today but back in AF with RVR  BP 90/71   Pulse (!) 130   Temp 98.2 F (36.8 C)   Resp (!) 26   Ht 5\' 4"  (1.626 m)   Wt 76.9 kg   SpO2 95%   BMI 29.10 kg/m    Intake/Output Summary (Last 24 hours) at 02/17/2024 1634 Last data filed at 02/17/2024 1600 Gross per 24 hour  Intake 1214.7 ml  Output 3176 ml  Net -1961.3 ml   Continue current care  Devarius Nelles C. Luna Salinas, MD [facility] [phone]

## 2024-02-17 NOTE — CV Procedure (Signed)
    DIRECT CURRENT CARDIOVERSION  NAME:  [name]   MRN: [id number] DOB:  06/26/1941   ADMIT DATE: 02/08/2024   INDICATIONS: Atrial fibrillation    PROCEDURE:   Informed consent was obtained prior to the procedure. The risks, benefits and alternatives for the procedure were discussed and the patient comprehended these risks. Once an appropriate time out was taken, the patient had the defibrillator pads placed in the anterior and posterior position. The patient then underwent sedation by the CCM service. Once an appropriate level of sedation was achieved, the patient received a single biphasic, synchronized 200J shock with brief conversion to sinus rhythm. NPatient reverted back to AF and cardioverted again with brief conversion to NSR before returning to AF.   Jules Oar, MD  3:00 PM

## 2024-02-18 DIAGNOSIS — I5021 Acute systolic (congestive) heart failure: Secondary | ICD-10-CM | POA: Diagnosis not present

## 2024-02-18 DIAGNOSIS — N179 Acute kidney failure, unspecified: Secondary | ICD-10-CM | POA: Diagnosis not present

## 2024-02-18 DIAGNOSIS — I340 Nonrheumatic mitral (valve) insufficiency: Secondary | ICD-10-CM | POA: Diagnosis not present

## 2024-02-18 DIAGNOSIS — E8770 Fluid overload, unspecified: Secondary | ICD-10-CM

## 2024-02-18 LAB — BASIC METABOLIC PANEL WITH GFR
Anion gap: 14 (ref 5–15)
Anion gap: 15 (ref 5–15)
BUN: 25 mg/dL — ABNORMAL HIGH (ref 8–23)
BUN: 25 mg/dL — ABNORMAL HIGH (ref 8–23)
CO2: 27 mmol/L (ref 22–32)
CO2: 27 mmol/L (ref 22–32)
Calcium: 6.8 mg/dL — ABNORMAL LOW (ref 8.9–10.3)
Calcium: 7.2 mg/dL — ABNORMAL LOW (ref 8.9–10.3)
Chloride: 86 mmol/L — ABNORMAL LOW (ref 98–111)
Chloride: 86 mmol/L — ABNORMAL LOW (ref 98–111)
Creatinine, Ser: 1.46 mg/dL — ABNORMAL HIGH (ref 0.44–1.00)
Creatinine, Ser: 1.49 mg/dL — ABNORMAL HIGH (ref 0.44–1.00)
GFR, Estimated: 36 mL/min — ABNORMAL LOW (ref >60)
GFR, Estimated: 35 mL/min — ABNORMAL LOW (ref >60)
Glucose, Bld: 201 mg/dL — ABNORMAL HIGH (ref 70–99)
Glucose, Bld: 240 mg/dL — ABNORMAL HIGH (ref 70–99)
Potassium: 3.4 mmol/L — ABNORMAL LOW (ref 3.5–5.1)
Potassium: 4.4 mmol/L (ref 3.5–5.1)
Sodium: 127 mmol/L — ABNORMAL LOW (ref 135–145)
Sodium: 128 mmol/L — ABNORMAL LOW (ref 135–145)

## 2024-02-18 LAB — GLUCOSE, CAPILLARY
Glucose-Capillary: 109 mg/dL — ABNORMAL HIGH (ref 70–99)
Glucose-Capillary: 112 mg/dL — ABNORMAL HIGH (ref 70–99)
Glucose-Capillary: 119 mg/dL — ABNORMAL HIGH (ref 70–99)
Glucose-Capillary: 119 mg/dL — ABNORMAL HIGH (ref 70–99)
Glucose-Capillary: 142 mg/dL — ABNORMAL HIGH (ref 70–99)
Glucose-Capillary: 151 mg/dL — ABNORMAL HIGH (ref 70–99)

## 2024-02-18 LAB — COOXEMETRY PANEL
Carboxyhemoglobin: 2.2 % — ABNORMAL HIGH (ref 0.5–1.5)
Methemoglobin: <0.7 % (ref 0.0–1.5)
O2 Saturation: 61 %
Total hemoglobin: 8.8 g/dL — ABNORMAL LOW (ref 12.0–16.0)

## 2024-02-18 LAB — MAGNESIUM: Magnesium: 1.9 mg/dL (ref 1.7–2.4)

## 2024-02-18 MED ORDER — DIGOXIN 125 MCG PO TABS
0.1250 mg | ORAL_TABLET | Freq: Every day | ORAL | Status: DC
  Administered 2024-02-18 – 2024-02-19 (×2): 0.125 mg via ORAL
  Filled 2024-02-18 (×2): qty 1

## 2024-02-18 MED ORDER — POTASSIUM CHLORIDE CRYS ER 20 MEQ PO TBCR
40.0000 meq | EXTENDED_RELEASE_TABLET | Freq: Once | ORAL | Status: AC
  Administered 2024-02-18: 40 meq via ORAL
  Filled 2024-02-18: qty 2

## 2024-02-18 MED ORDER — ASPIRIN 81 MG PO TBEC
81.0000 mg | DELAYED_RELEASE_TABLET | Freq: Every day | ORAL | Status: DC
  Administered 2024-02-19 – 2024-02-20 (×2): 81 mg via ORAL
  Filled 2024-02-18 (×2): qty 1

## 2024-02-18 MED ORDER — POTASSIUM CHLORIDE 10 MEQ/50ML IV SOLN
10.0000 meq | INTRAVENOUS | Status: AC
  Administered 2024-02-18 – 2024-02-19 (×4): 10 meq via INTRAVENOUS
  Filled 2024-02-18: qty 50

## 2024-02-18 MED ORDER — TRAMADOL HCL 50 MG PO TABS
50.0000 mg | ORAL_TABLET | Freq: Two times a day (BID) | ORAL | Status: DC
  Administered 2024-02-18 – 2024-02-20 (×5): 50 mg via ORAL
  Filled 2024-02-18 (×5): qty 1

## 2024-02-18 MED ORDER — POTASSIUM CHLORIDE 10 MEQ/50ML IV SOLN
10.0000 meq | INTRAVENOUS | Status: AC
  Administered 2024-02-18 (×3): 10 meq via INTRAVENOUS
  Filled 2024-02-18 (×4): qty 50

## 2024-02-18 MED ORDER — WITCH HAZEL-GLYCERIN EX PADS
MEDICATED_PAD | CUTANEOUS | Status: DC | PRN
  Administered 2024-03-02: 1 via TOPICAL
  Filled 2024-02-18: qty 100

## 2024-02-18 MED ORDER — POTASSIUM CHLORIDE CRYS ER 20 MEQ PO TBCR
40.0000 meq | EXTENDED_RELEASE_TABLET | ORAL | Status: AC
  Administered 2024-02-18 (×2): 40 meq via ORAL
  Filled 2024-02-18 (×2): qty 2

## 2024-02-18 MED ORDER — MAGNESIUM SULFATE 2 GM/50ML IV SOLN
2.0000 g | Freq: Once | INTRAVENOUS | Status: AC
  Administered 2024-02-18: 2 g via INTRAVENOUS
  Filled 2024-02-18: qty 50

## 2024-02-18 NOTE — Plan of Care (Signed)
  Problem: Pain Managment: Goal: General experience of comfort will improve and/or be controlled Outcome: Progressing   Problem: Safety: Goal: Ability to remain free from injury will improve Outcome: Progressing   Problem: Skin Integrity: Goal: Risk for impaired skin integrity will decrease Outcome: Progressing   Problem: Education: Goal: Will demonstrate proper wound care and an understanding of methods to prevent future damage Outcome: Progressing Goal: Knowledge of disease or condition will improve Outcome: Progressing Goal: Knowledge of the prescribed therapeutic regimen will improve Outcome: Progressing Goal: Individualized Educational Video(s) Outcome: Progressing

## 2024-02-18 NOTE — Progress Notes (Signed)
 Advanced Heart Failure Rounding Note  Cardiologist: [name], MD  Chief Complaint: Acute systolic heart failure Subjective:    S/p MVR 02/08/24. Post-op complicated by persistent a fib RVR and volume overload.   Remains on milrinone  and lasix  gtt. Diuresing well  Out 2.6L yesterday  Remains in AF with RVR on IV amio. DCCV on 4/18 failed x 2. Digoxin  started.   CVP 15   Objective:    Weight Range: 71.7 kg Body mass index is 27.13 kg/m.   Vital Signs:   Temp:  [97.7 F (36.5 C)-98.7 F (37.1 C)] 97.7 F (36.5 C) (04/19 1116) Pulse Rate:  [105-140] 111 (04/19 1100) Resp:  [11-43] 24 (04/19 1100) BP: (81-115)/(51-82) 101/75 (04/19 1100) SpO2:  [76 %-99 %] 92 % (04/19 1100) Weight:  [71.7 kg] 71.7 kg (04/19 0700) Last BM Date : 02/18/24  Weight change: Filed Weights   02/16/24 0500 02/17/24 0500 02/18/24 0700  Weight: 77.4 kg 76.9 kg 71.7 kg   Intake/Output:  Intake/Output Summary (Last 24 hours) at 02/18/2024 1257 Last data filed at 02/18/2024 1247 Gross per 24 hour  Intake 1596.85 ml  Output 2708 ml  Net -1111.15 ml    Physical Exam    General:  Sitting up in bed No resp difficulty HEENT: normal Neck: supple. JVP to ear Cor: irreg tachy  sternal wound ok  Lungs: clear Abdomen: soft, nontender, nondistended. No hepatosplenomegaly. No bruits or masses. Good bowel sounds. Extremities: no cyanosis, clubbing, rash, 2-3+ edema + UNNA Neuro: alert & orientedx3, cranial nerves grossly intact. moves all 4 extremities w/o difficulty. Affect pleasant  Telemetry   AF RVR 100-120 Personally reviewed   Labs    CBC Recent Labs    02/16/24 0400 02/17/24 0357  WBC 16.5* 15.9*  HGB 8.9* 8.4*  HCT 27.6* 25.8*  MCV 92.0 91.5  PLT 312 320   Basic Metabolic Panel Recent Labs    40/98/11 1254 02/18/24 0500  NA 130* 127*  K 3.5 3.4*  CL 90* 86*  CO2 27 27  GLUCOSE 218* 201*  BUN 26* 25*  CREATININE 1.54* 1.46*  CALCIUM  7.2* 6.8*  MG 2.1 1.9   Imaging    No results found.  Medications:    Scheduled Medications:  apixaban   5 mg Oral BID   aspirin   81 mg Oral Daily   Chlorhexidine  Gluconate Cloth  6 each Topical Daily   digoxin   0.125 mg Oral Daily   ezetimibe   10 mg Oral Daily   feeding supplement  1 Container Oral TID BM   fentaNYL  (SUBLIMAZE ) injection  100 mcg Intravenous Once   insulin  aspart  2-6 Units Subcutaneous Q4H   pantoprazole   40 mg Oral Daily   sodium chloride  flush  10-40 mL Intracatheter Q12H   sodium chloride  flush  3 mL Intravenous Q12H   sodium chloride  flush  3-10 mL Intravenous Q12H   traMADol   50 mg Oral Q6H   Infusions:  amiodarone  60 mg/hr (02/18/24 1100)   furosemide  (LASIX ) 200 mg in dextrose  5 % 100 mL (2 mg/mL) infusion 8 mg/hr (02/18/24 1100)   milrinone  0.125 mcg/kg/min (02/18/24 1100)   PRN Medications: dextrose , metoprolol  tartrate, morphine  injection, ondansetron  (ZOFRAN ) IV, mouth rinse, oxyCODONE , phenol, sodium chloride  flush, sodium chloride  flush, sodium chloride  flush, witch hazel-glycerin   Patient Profile   [name] is a 83 y.o. female with HTN and PAF. Admitted with SOB> found to have severe MR. Now s/p MVR, MAZE and LAA clipping. AHF team consulted for  acute systolic heart failure.   Assessment/Plan   Acute systolic heart failure, NiCM - Intra-op TEE 02/08/24 with EF 55-60% and severe MR - [facility] 2/25 with no CAD. RA 8, PA 41/17 (14), Fick CO/CI 7.76/4.41, PAPi 3, LVEDP 11 - Echo 4/25 with EF 35-40%, LV with GHK, RV normal, LA mod dilated, RA severely dilated, trivial MR, mod TR - Suspect drop in EF 2/2 persistent AF RVR.  - NYHA II-IIIa, IV on admission - Remains on milrinone  0.125 Co-ox 61 - CVP 15-16 - Remains volume overloaded. Increase lasix  to 12/hr - Continue UNNA boots - BP soft. Suspect we can add GDMT once out of AF   Atrial fibrillation with RVR - S/p Maze and LAA clipping 4/25 - Remains in a fib RVR despite multiple amiodarone  boluses - Continue amiodarone  gtt.  Continue amio 60/hr. - Continue eliquis  5 mg BID - Failed DC-CV x 2 on 4/17 - Continue to load amio. Repeat DC-CV on Monday - Dig started for rate control. Will stop prior to d/c   Severe MR s/p MVR - POD 10 - Intra-op TEE with severe MR 02/08/24 - Trivial MR on echo 02/14/24   HTN - SBP soft but stable - Will follow with diuresis - Avoid hypotension  5. Urine retention - Foley in place. Remove ASAP  6. AKI - baseline Cr normal - 1.34>1.48 > 1,4 today  - continue diuresis as above  7. Hypokalemia - K 3.4 supp aggressively prior to DC-CV  8. Debility - PT/OT  Length of Stay: 10  CRITICAL CARE Performed by: Jules Oar  Total critical care time: 45 minutes  Critical care time was exclusive of separately billable procedures and treating other patients.  Critical care was necessary to treat or prevent imminent or life-threatening deterioration.  Critical care was time spent personally by me (independent of midlevel providers or residents) on the following activities: development of treatment plan with patient and/or surrogate as well as nursing, discussions with consultants, evaluation of patient's response to treatment, examination of patient, obtaining history from patient or surrogate, ordering and performing treatments and interventions, ordering and review of laboratory studies, ordering and review of radiographic studies, pulse oximetry and re-evaluation of patient's condition.   Jules Oar, MD  02/18/2024, 12:57 PM

## 2024-02-18 NOTE — Progress Notes (Signed)
      [address]       [place] [address]             [phone]      No new issues  Still in AF with RVR  BP 96/68   Pulse (!) 119   Temp (!) 97 F (36.1 C) (Oral)   Resp (!) 21   Ht 5\' 4"  (1.626 m)   Wt 71.7 kg   SpO2 90%   BMI 27.13 kg/m   Intake/Output Summary (Last 24 hours) at 02/18/2024 1927 Last data filed at 02/18/2024 1900 Gross per 24 hour  Intake 1505.2 ml  Output 3078 ml  Net -1572.8 ml   Repeat DCCV Monday  Landon Pinion C. Luna Salinas, MD [facility] [phone]

## 2024-02-18 NOTE — Progress Notes (Signed)
 NAME:  [name], MRN:  [id number], DOB:  10/05/1941, LOS: 10 ADMISSION DATE:  02/08/2024, CONSULTATION DATE:/07/2024 REFERRING MD: Melene Sportsman, CHIEF COMPLAINT:/07/2024  History of Present Illness:  83 year old female with hypertension, paroxysmal A-fib who initially presented with increasing shortness of breath and bilateral leg swelling, noted to have severe mitral valve regurgitation and moderate tricuspid valve regurgitation.  Her ejection fraction was 60%, she underwent evaluation with cardiac cath which showed no coronary artery disease, today she underwent mitral valve repair with maze procedure.  Patient remained intubated and was transferred to ICU postop.  PCCM was consulted for help evaluation medical management  Pertinent  Medical History   Past Medical History:  Diagnosis Date   Acid reflux    Anemia    Arthritis    Atrial fibrillation (HCC)    Cataract    Diverticulosis of colon    Endometrial cancer (HCC)    1A grade endometrial ca   Essential hypertension 01/24/2009   Qualifier: Diagnosis of  By: Nelson-Smith CMA (AAMA), Dottie     Hx of radiation therapy 04/24/08-05/29/08& 8/12/,8/19,&06/27/08   external beam  through 7/09 ,then intracavity in 06/2008   Hypertension    Mitral valve prolapse    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   Tricuspid regurgitation      Significant Hospital Events: Including procedures, antibiotic start and stop dates in addition to other pertinent events   4/9 Mitral valve repair s/p Maze procedure, extubated 4/10 Afib RVR on amio gtt, requiring pressors  4/11 A-fib controlled with Amio drip, weaning off pressors, still having some nausea secondary to vasoplegia but improving, up to chair, plan to walk later-overall progressing 4/14 changing amio to PO after bolus, changing to PO eliquis , introducer wires and foley to dc  4/16 slp for concern of dysphagia, picc placed, coox low, milrinone / lasix  gtt 4/18 failed DCCV, dig  added  Interim History / Subjective:   Coox 69> 61% on milrinone  0.125, lasix  gtt 8mg /hr Wt 76.9> 71.9kg HR better, remains afib, up into 120's OOB CVP 11 Still poor appetite  Objective   Blood pressure 104/63, pulse (!) 105, temperature 98.7 F (37.1 C), temperature source Oral, resp. rate (!) 22, height 5\' 4"  (1.626 m), weight 71.7 kg, SpO2 95%. CVP:  [1 mmHg-29 mmHg] 12 mmHg      Intake/Output Summary (Last 24 hours) at 02/18/2024 1002 Last data filed at 02/18/2024 0900 Gross per 24 hour  Intake 1541.85 ml  Output 2247 ml  Net -705.15 ml   Filed Weights   02/16/24 0500 02/17/24 0500 02/18/24 0700  Weight: 77.4 kg 76.9 kg 71.7 kg   Examination: General:  Pleasant older female sitting in bed in NAD HEENT: MM pink/moist, elevated JVP Neuro: Aox4, MAE CV: irir, afib 100-125, healing sternal incision, RUE PICC PULM:  non labored, few left basilar insp rales, otherwise clear GI: soft, bs+, NT, foley- cyu, pt reports smear overnight Extremities: warm/dry, ongoing LE edema/ unna boots Skin: no rashes   UOP 2.6L /24hrs unmeasured urinary occurrences previously> net I/Os inaccurate Wtx 80.4> 77.4> 76.9> 71kg Labs reviewed> K 3.4. sCr 1.54> 1.46, Na 127, Mag 1.9, CBC stable  Resolved Hospital Problem list   AcuPost-op vasoplegia te respiratory insufficiency, postop-extubated on 4/9   Assessment & Plan:   Severe MV s/p repair 4/9 Afib s/p MAZE  Acute HFrEF  HTN HLD - course c/b afib with RVR, hypervolemia, acute HFrEF (suspected 2/2 afib w/RVR) P - per TCTS and HF following - plans for repeat DCCV  per AHF, TBD - cont eliquis   - ASA, statin  - trending CVP/ coox - digoxin  added 4/18, monitor closely on amio and with AKI - lasix  gtt + milrinone  per HF.  Repeat BMET this afternoon for ongoing aggressive K/ Mag replete, goal K> 4, Mag > 2 - goal MAP > 65 - strict I/O, weights - multimodal analgesia w/ bowel regimen - ongoing IS, pulm hygiene, mobilize/ PT  N/v,  resolved P - resolved, starting to move bowels.  Ongoing poor appetite  AKI Urinary retention   Hypervolemic hyponatremia Hypokalemia P - s/p 3 days bethanecol for retention.  Consider d/c if ok with AHF given ongoing lasix  gtt - lasix  gtt as above - BMET q12 - replete electrolytes prn - serial renal indices/ I/Os  Leukocytosis - remains afebrile, WBC stable, suspect reactive, no clear source - completed zosyn  x 5d 4/16,  BCX neg,  P:  - WBC and fever curve stable, cont to monitor clinically off abx  Dysphagia - hx of, s/p SLP 4/17 following - maximize nutrition as able   Best Practice (right click and "Reselect all SmartList Selections" daily)   Diet/type: per SLP DVT prophylaxis: eliquis   GI prophylaxis: PPI Lines: PICC  Foley: yes Code Status:  full code Last date of multidisciplinary goals of care discussion [per primary  ]  Husband updated at bedside 4/19  CCT 30   Early Glisson, MSN, AG-ACNP-BC [employer] [facility] 02/18/2024, 10:02 AM  See Amion for pager If no response to pager , please call [phone] until 7pm After 7:00 pm call Elink  [phone]

## 2024-02-18 NOTE — Progress Notes (Signed)
 eLink Physician-Brief Progress Note Patient Name: [name] DOB: [birth year] MRN: [id number]   Date of Service  02/18/2024  HPI/Events of Note  Vomited her oral KCl 40 mEq  eICU Interventions  Replace it with IV CVC KCl     Intervention Category Minor Interventions: Electrolytes abnormality - evaluation and management  Annamary Buschman 02/18/2024, 9:30 PM

## 2024-02-18 NOTE — Progress Notes (Signed)
 10 Days Post-Op Procedure(s) (LRB): MITRAL VALVE REPAIR USING A SIMULUS SEMI-RIGID ANNULOPLASTY BAND (N/A) MAZE PROCEDURE (N/A) CLIPPING, LEFT ATRIAL APPENDAGE USING A MEDTRONIC CLIP (N/A) ECHOCARDIOGRAM, TRANSESOPHAGEAL (N/A) Subjective: Up in chair "Fair to middlin'" Denies pain  Objective: Vital signs in last 24 hours: Temp:  [98.1 F (36.7 C)-98.5 F (36.9 C)] 98.5 F (36.9 C) (04/19 0425) Pulse Rate:  [111-140] 121 (04/19 0700) Cardiac Rhythm: Atrial fibrillation (04/19 0400) Resp:  [11-43] 27 (04/19 0700) BP: (81-115)/(51-89) 95/76 (04/19 0700) SpO2:  [76 %-99 %] 93 % (04/19 0700) Weight:  [71.7 kg] 71.7 kg (04/19 0700)  Hemodynamic parameters for last 24 hours: CVP:  [1 mmHg-29 mmHg] 12 mmHg  Intake/Output from previous day: 04/18 0701 - 04/19 0700 In: 1703 [P.O.:500; I.V.:961.5; IV Piggyback:241.4] Out: 2657 [Urine:2655; Stool:2] Intake/Output this shift: No intake/output data recorded.  General appearance: alert, cooperative, and no distress Neurologic: intact Heart: irregularly irregular rhythm Lungs: diminished breath sounds bibasilar Abdomen: normal findings: soft, non-tender  Lab Results: Recent Labs    02/16/24 0400 02/17/24 0357  WBC 16.5* 15.9*  HGB 8.9* 8.4*  HCT 27.6* 25.8*  PLT 312 320   BMET:  Recent Labs    02/17/24 1254 02/18/24 0500  NA 130* 127*  K 3.5 3.4*  CL 90* 86*  CO2 27 27  GLUCOSE 218* 201*  BUN 26* 25*  CREATININE 1.54* 1.46*  CALCIUM  7.2* 6.8*    PT/INR: No results for input(s): "LABPROT", "INR" in the last 72 hours. ABG    Component Value Date/Time   PHART 7.367 02/08/2024 1849   HCO3 23.9 02/08/2024 1849   TCO2 25 02/08/2024 1849   ACIDBASEDEF 2.0 02/08/2024 1849   O2SAT 61 02/18/2024 0500   CBG (last 3)  Recent Labs    02/17/24 1946 02/17/24 2352 02/18/24 0425  GLUCAP 140* 116* 112*    Assessment/Plan: S/P Procedure(s) (LRB): MITRAL VALVE REPAIR USING A SIMULUS SEMI-RIGID  ANNULOPLASTY BAND (N/A) MAZE PROCEDURE (N/A) CLIPPING, LEFT ATRIAL APPENDAGE USING A MEDTRONIC CLIP (N/A) ECHOCARDIOGRAM, TRANSESOPHAGEAL (N/A) POD # 10 NEURO- intact CV- in a fib with RVR on amiodarone  and digoxin   Not on beta blocker- consider low dose Coreg  On Eliquis   Co-ox 61 on milrinone  0.125  DCCV tomorrow? RESP- CXR shows atelectasis, small effusions  IS, diuresis RENAL- about 1 L negative  Hypokalemia- supplement  Hyponatremia- monitor ENDO- CBG mildly elevated GI- appetite remains poor Deconditioning- mobilize as tolerated, limited by A fib  LOS: 10 days    [name] 02/18/2024

## 2024-02-19 DIAGNOSIS — I340 Nonrheumatic mitral (valve) insufficiency: Secondary | ICD-10-CM | POA: Diagnosis not present

## 2024-02-19 DIAGNOSIS — I480 Paroxysmal atrial fibrillation: Secondary | ICD-10-CM | POA: Diagnosis not present

## 2024-02-19 DIAGNOSIS — E8770 Fluid overload, unspecified: Secondary | ICD-10-CM | POA: Diagnosis not present

## 2024-02-19 DIAGNOSIS — I5021 Acute systolic (congestive) heart failure: Secondary | ICD-10-CM | POA: Diagnosis not present

## 2024-02-19 DIAGNOSIS — N179 Acute kidney failure, unspecified: Secondary | ICD-10-CM | POA: Diagnosis not present

## 2024-02-19 LAB — COOXEMETRY PANEL
Carboxyhemoglobin: 1.9 % — ABNORMAL HIGH (ref 0.5–1.5)
Methemoglobin: <0.7 % (ref 0.0–1.5)
O2 Saturation: 64.6 %
Total hemoglobin: 8 g/dL — ABNORMAL LOW (ref 12.0–16.0)

## 2024-02-19 LAB — CBC
HCT: 25.6 % — ABNORMAL LOW (ref 36.0–46.0)
Hemoglobin: 8.4 g/dL — ABNORMAL LOW (ref 12.0–15.0)
MCH: 30 pg (ref 26.0–34.0)
MCHC: 32.8 g/dL (ref 30.0–36.0)
MCV: 91.4 fL (ref 80.0–100.0)
Platelets: 331 10*3/uL (ref 150–400)
RBC: 2.8 MIL/uL — ABNORMAL LOW (ref 3.87–5.11)
RDW: 20 % — ABNORMAL HIGH (ref 11.5–15.5)
WBC: 15.9 10*3/uL — ABNORMAL HIGH (ref 4.0–10.5)
nRBC: 0 % (ref 0.0–0.2)

## 2024-02-19 LAB — BASIC METABOLIC PANEL WITH GFR
Anion gap: 12 (ref 5–15)
Anion gap: 14 (ref 5–15)
BUN: 22 mg/dL (ref 8–23)
BUN: 22 mg/dL (ref 8–23)
CO2: 28 mmol/L (ref 22–32)
CO2: 27 mmol/L (ref 22–32)
Calcium: 6.9 mg/dL — ABNORMAL LOW (ref 8.9–10.3)
Calcium: 7.1 mg/dL — ABNORMAL LOW (ref 8.9–10.3)
Chloride: 87 mmol/L — ABNORMAL LOW (ref 98–111)
Chloride: 85 mmol/L — ABNORMAL LOW (ref 98–111)
Creatinine, Ser: 1.41 mg/dL — ABNORMAL HIGH (ref 0.44–1.00)
Creatinine, Ser: 1.31 mg/dL — ABNORMAL HIGH (ref 0.44–1.00)
GFR, Estimated: 38 mL/min — ABNORMAL LOW (ref >60)
GFR, Estimated: 41 mL/min — ABNORMAL LOW (ref >60)
Glucose, Bld: 133 mg/dL — ABNORMAL HIGH (ref 70–99)
Glucose, Bld: 227 mg/dL — ABNORMAL HIGH (ref 70–99)
Potassium: 3.7 mmol/L (ref 3.5–5.1)
Potassium: 3.4 mmol/L — ABNORMAL LOW (ref 3.5–5.1)
Sodium: 127 mmol/L — ABNORMAL LOW (ref 135–145)
Sodium: 126 mmol/L — ABNORMAL LOW (ref 135–145)

## 2024-02-19 LAB — GLUCOSE, CAPILLARY
Glucose-Capillary: 127 mg/dL — ABNORMAL HIGH (ref 70–99)
Glucose-Capillary: 170 mg/dL — ABNORMAL HIGH (ref 70–99)
Glucose-Capillary: 143 mg/dL — ABNORMAL HIGH (ref 70–99)
Glucose-Capillary: 119 mg/dL — ABNORMAL HIGH (ref 70–99)
Glucose-Capillary: 141 mg/dL — ABNORMAL HIGH (ref 70–99)
Glucose-Capillary: 106 mg/dL — ABNORMAL HIGH (ref 70–99)

## 2024-02-19 LAB — MAGNESIUM: Magnesium: 1.9 mg/dL (ref 1.7–2.4)

## 2024-02-19 MED ORDER — SODIUM CHLORIDE 0.9 % IV SOLN
12.5000 mg | Freq: Four times a day (QID) | INTRAVENOUS | Status: DC | PRN
  Administered 2024-02-19: 12.5 mg via INTRAVENOUS
  Filled 2024-02-19 (×3): qty 0.5

## 2024-02-19 MED ORDER — MAGNESIUM SULFATE 2 GM/50ML IV SOLN
2.0000 g | Freq: Once | INTRAVENOUS | Status: AC
  Administered 2024-02-19: 2 g via INTRAVENOUS
  Filled 2024-02-19: qty 50

## 2024-02-19 MED ORDER — METOLAZONE 5 MG PO TABS
5.0000 mg | ORAL_TABLET | Freq: Once | ORAL | Status: AC
  Administered 2024-02-19: 5 mg via ORAL
  Filled 2024-02-19: qty 1

## 2024-02-19 MED ORDER — POTASSIUM CHLORIDE 20 MEQ PO PACK
20.0000 meq | PACK | ORAL | Status: AC
  Administered 2024-02-19 (×3): 20 meq
  Filled 2024-02-19 (×3): qty 1

## 2024-02-19 MED ORDER — POTASSIUM CHLORIDE 20 MEQ PO PACK
40.0000 meq | PACK | Freq: Once | ORAL | Status: AC
  Administered 2024-02-19: 40 meq via ORAL

## 2024-02-19 MED ORDER — POTASSIUM CHLORIDE CRYS ER 20 MEQ PO TBCR: 40.0000 meq | EXTENDED_RELEASE_TABLET | Freq: Once | ORAL | Status: DC

## 2024-02-19 MED ORDER — LOPERAMIDE HCL 2 MG PO CAPS
2.0000 mg | ORAL_CAPSULE | ORAL | Status: DC | PRN
  Administered 2024-02-19 – 2024-03-10 (×6): 2 mg via ORAL
  Filled 2024-02-19 (×6): qty 1

## 2024-02-19 NOTE — Progress Notes (Signed)
 11 Days Post-Op Procedure(s) (LRB): MITRAL VALVE REPAIR USING A SIMULUS SEMI-RIGID ANNULOPLASTY BAND (N/A) MAZE PROCEDURE (N/A) CLIPPING, LEFT ATRIAL APPENDAGE USING A MEDTRONIC CLIP (N/A) ECHOCARDIOGRAM, TRANSESOPHAGEAL (N/A) Subjective: Denies pain, appetite fair  Objective: Vital signs in last 24 hours: Temp:  [97 F (36.1 C)-98.7 F (37.1 C)] 98.3 F (36.8 C) (04/20 0340) Pulse Rate:  [105-134] 118 (04/20 0700) Cardiac Rhythm: Atrial fibrillation (04/19 2000) Resp:  [13-32] 22 (04/20 0700) BP: (90-128)/(54-75) 116/70 (04/20 0700) SpO2:  [88 %-95 %] 90 % (04/20 0700)  Hemodynamic parameters for last 24 hours: CVP:  [11 mmHg-13 mmHg] 11 mmHg  Intake/Output from previous day: 04/19 0701 - 04/20 0700 In: 1621.7 [P.O.:300; I.V.:958.2; IV Piggyback:363.5] Out: 3051 [Urine:3050; Stool:1] Intake/Output this shift: No intake/output data recorded.  General appearance: alert, cooperative, and no distress Neurologic: intact Heart: irregularly irregular rhythm Lungs: diminished breath sounds bibasilar Abdomen: normal findings: soft, non-tender  Lab Results: Recent Labs    02/17/24 0357 02/19/24 0440  WBC 15.9* 15.9*  HGB 8.4* 8.4*  HCT 25.8* 25.6*  PLT 320 331   BMET:  Recent Labs    02/18/24 1649 02/19/24 0440  NA 128* 127*  K 4.4 3.7  CL 86* 87*  CO2 27 28  GLUCOSE 240* 133*  BUN 25* 22  CREATININE 1.49* 1.41*  CALCIUM  7.2* 6.9*    PT/INR: No results for input(s): "LABPROT", "INR" in the last 72 hours. ABG    Component Value Date/Time   PHART 7.367 02/08/2024 1849   HCO3 23.9 02/08/2024 1849   TCO2 25 02/08/2024 1849   ACIDBASEDEF 2.0 02/08/2024 1849   O2SAT 64.6 02/19/2024 0440   CBG (last 3)  Recent Labs    02/18/24 1949 02/18/24 2337 02/19/24 0336  GLUCAP 142* 109* 127*    Assessment/Plan: S/P Procedure(s) (LRB): MITRAL VALVE REPAIR USING A SIMULUS SEMI-RIGID ANNULOPLASTY BAND (N/A) MAZE PROCEDURE (N/A) CLIPPING, LEFT  ATRIAL APPENDAGE USING A MEDTRONIC CLIP (N/A) ECHOCARDIOGRAM, TRANSESOPHAGEAL (N/A) POD # 11 NEURO- intact CV- remains in AF with RVR on amiodarone  and digoxin   On Eliquis   For cardioversion tomorrow  Co-ox 65 on milrinone  0.125 RESP- continue IS RENAL- on Lasix  drip  1.5L negative yesterday  Creatinine stable  Hyponatremia stable ENDO- CBG well controlled GI - tolerating diet Deconditioning- PT/OT, Cardiac rehab   LOS: 11 days    [name] 02/19/2024

## 2024-02-19 NOTE — Progress Notes (Signed)
 Advanced Heart Failure Rounding Note  Cardiologist: [name], MD  Chief Complaint: Acute systolic heart failure Subjective:    - S/p MVR 02/08/24. Post-op complicated by persistent a fib RVR and volume overload. - Failed DCCV on 4/18  Remains on milrinone  and lasix  gtt. Diuresing well  Out 3.0L yesterday  Remains in AF with RVR on IV amio  CVP 12 Co-ox 65%  Weight down 13 pounds in last few days. Still 13 pounds over pre-op weight (151 pounds)   Objective:    Weight Range: 74.8 kg Body mass index is 28.31 kg/m.   Vital Signs:   Temp:  [97 F (36.1 C)-98.3 F (36.8 C)] 98.2 F (36.8 C) (04/20 0752) Pulse Rate:  [105-134] 111 (04/20 1036) Resp:  [13-32] 26 (04/20 0900) BP: (93-116)/(54-76) 104/63 (04/20 0900) SpO2:  [88 %-95 %] 95 % (04/20 0900) Weight:  [74.8 kg-75.4 kg] 74.8 kg (04/20 1256) Last BM Date : 02/19/24  Weight change: Filed Weights   02/18/24 0700 02/19/24 1200 02/19/24 1256  Weight: 71.7 kg 75.4 kg 74.8 kg   Intake/Output:  Intake/Output Summary (Last 24 hours) at 02/19/2024 1344 Last data filed at 02/19/2024 1321 Gross per 24 hour  Intake 1409.84 ml  Output 3150 ml  Net -1740.16 ml    Physical Exam    General:  Sitting up in chair . No resp difficulty HEENT: normal Neck: supple. JVP to jaw. Carotids 2+ bilat; no bruits. No lymphadenopathy or thryomegaly appreciated. Cor: Irreg tachy  Lungs: clear Abdomen: soft, nontender, nondistended. No hepatosplenomegaly. No bruits or masses. Good bowel sounds. Extremities: no cyanosis, clubbing, rash, 2+ edema + UNNA Neuro: alert & orientedx3, cranial nerves grossly intact. moves all 4 extremities w/o difficulty. Affect pleasant  Telemetry   AF RVR 100-110 Personally reviewed   Labs    CBC Recent Labs    02/17/24 0357 02/19/24 0440  WBC 15.9* 15.9*  HGB 8.4* 8.4*  HCT 25.8* 25.6*  MCV 91.5 91.4  PLT 320 331   Basic Metabolic Panel Recent Labs    78/46/96 0500 02/18/24 1649  02/19/24 0440  NA 127* 128* 127*  K 3.4* 4.4 3.7  CL 86* 86* 87*  CO2 27 27 28   GLUCOSE 201* 240* 133*  BUN 25* 25* 22  CREATININE 1.46* 1.49* 1.41*  CALCIUM  6.8* 7.2* 6.9*  MG 1.9  --  1.9   Imaging   No results found.  Medications:    Scheduled Medications:  apixaban   5 mg Oral BID   aspirin  EC  81 mg Oral Daily   Chlorhexidine  Gluconate Cloth  6 each Topical Daily   digoxin   0.125 mg Oral Daily   ezetimibe   10 mg Oral Daily   feeding supplement  1 Container Oral TID BM   insulin  aspart  2-6 Units Subcutaneous Q4H   pantoprazole   40 mg Oral Daily   potassium chloride   20 mEq Per Tube Q4H   potassium chloride   40 mEq Oral Once   sodium chloride  flush  3-10 mL Intravenous Q12H   traMADol   50 mg Oral Q12H   Infusions:  amiodarone  60 mg/hr (02/19/24 1051)   furosemide  (LASIX ) 200 mg in dextrose  5 % 100 mL (2 mg/mL) infusion 12 mg/hr (02/19/24 0917)   milrinone  0.125 mcg/kg/min (02/19/24 0800)   PRN Medications: dextrose , metoprolol  tartrate, morphine  injection, ondansetron  (ZOFRAN ) IV, mouth rinse, oxyCODONE , phenol, sodium chloride  flush, witch hazel-glycerin   Patient Profile   [name] is a 83 y.o. female with HTN and  PAF. Admitted with SOB> found to have severe MR. Now s/p MVR, MAZE and LAA clipping. AHF team consulted for acute systolic heart failure.   Assessment/Plan   Acute systolic heart failure, NiCM - Intra-op TEE 02/08/24 with EF 55-60% and severe MR - [facility] 2/25 with no CAD. RA 8, PA 41/17 (14), Fick CO/CI 7.76/4.41, PAPi 3, LVEDP 11 - Echo 4/25 with EF 35-40%, LV with GHK, RV normal, LA mod dilated, RA severely dilated, trivial MR, mod TR - Suspect drop in EF 2/2 persistent AF RVR.  - NYHA II-IIIa, IV on admission - Remains on milrinone  0.125 Co-ox 65  - CVP 16 -> 12 - Remains volume overloaded. Continue lasix  gtt at 12/hr. Will give metolazone  5mg  today to help facilitate brisker diuresis - Continue UNNA boots - BP soft. Suspect we can add GDMT  once out of AF   Atrial fibrillation with RVR - S/p Maze and LAA clipping 4/25 - Remains in a fib RVR despite multiple amiodarone  boluses - Continue amiodarone  gtt. Continue amio 60/hr. - Continue eliquis  5 mg BID - Failed DC-CV x 2 on 4/17 - Continue to load IV amio. Repeat DC-CV tomorrow am. NPO after MN - Dig started for rate control. Will stop prior to d/c. Check dig level in am   Severe MR s/p MVR - POD 11 - Intra-op TEE with severe MR 02/08/24 - Trivial MR on echo 02/14/24   4. Urine retention - Foley in place. Remove ASAP  6. AKI - baseline Cr normal - 1.34>1.48 > 1.41 today - continue diuresis as above  7. Hypokalemia - K 3.7 supp aggressively prior to DC-CV  8. Debility - PT/OT  Length of Stay: 11   Jules Oar, MD  02/19/2024, 1:44 PM

## 2024-02-19 NOTE — Progress Notes (Signed)
 eLink Physician-Brief Progress Note Patient Name: [name] DOB: 10-18-1941 MRN: [id number]   Date of Service  02/19/2024  HPI/Events of Note  Persistent nausea/vomiting despite Zofran   eICU Interventions  Add promethazine  second line for 3 doses     Intervention Category Minor Interventions: Routine modifications to care plan (e.g. PRN medications for pain, fever)  Arrick Dutton 02/19/2024, 10:59 PM

## 2024-02-19 NOTE — Progress Notes (Signed)
      [address]       [place] [address]             [phone]      No new issues  BP (!) 94/55   Pulse 83   Temp 98.2 F (36.8 C) (Oral)   Resp 12   Ht 5\' 4"  (1.626 m)   Wt 74.8 kg   SpO2 94%   BMI 28.31 kg/m  Still AF although rate is a bit better   Intake/Output Summary (Last 24 hours) at 02/19/2024 1827 Last data filed at 02/19/2024 1630 Gross per 24 hour  Intake 1161.35 ml  Output 3325 ml  Net -2163.65 ml   Creatinine 1.3 K 3.4- PO K given CV tomorrow  Landon Pinion C. Luna Salinas, MD [facility] [phone]

## 2024-02-19 NOTE — Progress Notes (Signed)
 NAME:  [name], MRN:  [id number], DOB:  1941/10/05, LOS: 11 ADMISSION DATE:  02/08/2024, CONSULTATION DATE:/07/2024 REFERRING MD: Melene Sportsman, CHIEF COMPLAINT:/07/2024  History of Present Illness:  83 year old female with hypertension, paroxysmal A-fib who initially presented with increasing shortness of breath and bilateral leg swelling, noted to have severe mitral valve regurgitation and moderate tricuspid valve regurgitation.  Her ejection fraction was 60%, she underwent evaluation with cardiac cath which showed no coronary artery disease, today she underwent mitral valve repair with maze procedure.  Patient remained intubated and was transferred to ICU postop.  PCCM was consulted for help evaluation medical management  Pertinent  Medical History   Past Medical History:  Diagnosis Date   Acid reflux    Anemia    Arthritis    Atrial fibrillation (HCC)    Cataract    Diverticulosis of colon    Endometrial cancer (HCC)    1A grade endometrial ca   Essential hypertension 01/24/2009   Qualifier: Diagnosis of  By: Nelson-Smith CMA (AAMA), Dottie     Hx of radiation therapy 04/24/08-05/29/08& 8/12/,8/19,&06/27/08   external beam  through 7/09 ,then intracavity in 06/2008   Hypertension    Mitral valve prolapse    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   Tricuspid regurgitation      Significant Hospital Events: Including procedures, antibiotic start and stop dates in addition to other pertinent events   4/9 Mitral valve repair s/p Maze procedure, extubated 4/10 Afib RVR on amio gtt, requiring pressors  4/11 A-fib controlled with Amio drip, weaning off pressors, still having some nausea secondary to vasoplegia but improving, up to chair, plan to walk later-overall progressing 4/14 changing amio to PO after bolus, changing to PO eliquis , introducer wires and foley to dc  4/16 slp for concern of dysphagia, picc placed, coox low, milrinone / lasix  gtt 4/18 failed DCCV, dig  added  Interim History / Subjective:  No events overnight  Objective   Blood pressure 104/63, pulse (!) 111, temperature 98.2 F (36.8 C), temperature source Oral, resp. rate (!) 26, height 5\' 4"  (1.626 m), weight 71.7 kg, SpO2 95%. CVP:  [10 mmHg-13 mmHg] 10 mmHg      Intake/Output Summary (Last 24 hours) at 02/19/2024 1121 Last data filed at 02/19/2024 0800 Gross per 24 hour  Intake 1469.84 ml  Output 2501 ml  Net -1031.16 ml   Filed Weights   02/16/24 0500 02/17/24 0500 02/18/24 0700  Weight: 77.4 kg 76.9 kg 71.7 kg   Examination: General:  pleasant older female sitting in bedside chair in NAD HEENT: MM pink/moist Neuro: Aox4, MAE CV: irir, afib 100-120's PULM:  non labored, RA, few posterior insp bibasilar rales GI: soft, bs+, NT, foley Extremities: warm/dry, ongoing LE edema  Skin: no rashes   Labs reviewed Coox stable CVP 10  Resolved Hospital Problem list   AcuPost-op vasoplegia te respiratory insufficiency, postop-extubated on 4/9   Assessment & Plan:   Severe MV s/p repair 4/9 Afib s/p MAZE  Acute HFrEF  HTN HLD - course c/b afib with RVR, hypervolemia, acute HFrEF (suspected 2/2 afib w/RVR) P - per TCTS and HF following - plans for repeat DCCV per AHF 4/21 - cont eliquis   - ASA, statin  - trending CVP/ coox - digoxin  added 4/18 - lasix  gtt + milrinone  per HF.  BMET q12hr - goal K> 4, Mag > 2, replete prn - goal MAP > 65 - strict I/O, weights - multimodal analgesia w/ bowel regimen - ongoing IS, pulm hygiene,  mobilize/ PT  AKI Urinary retention   Hypervolemic hyponatremia Hypokalemia P -  d/c foley - lasix  gtt/  BMET q12, sCr stable - replete electrolytes prn - serial renal indices/ I/Os  Leukocytosis - remains afebrile, WBC stable, suspect reactive, no clear source - completed zosyn  x 5d 4/16,  BCX neg,  P:  - WBC and fever curve stable, cont to monitor clinically off abx  Dysphagia - hx of, s/p SLP 4/17 following - maximize  nutrition as able   Best Practice (right click and "Reselect all SmartList Selections" daily)   Diet/type: per SLP DVT prophylaxis: eliquis   GI prophylaxis: PPI Lines: PICC  Foley: d/c Code Status:  full code Last date of multidisciplinary goals of care discussion [per primary  ]  Early Glisson, MSN, AG-ACNP-BC [facility] 02/19/2024, 11:21 AM  See Amion for pager If no response to pager , please call [phone] until 7pm After 7:00 pm call Elink  [phone]

## 2024-02-20 ENCOUNTER — Inpatient Hospital Stay (HOSPITAL_COMMUNITY)

## 2024-02-20 DIAGNOSIS — I4891 Unspecified atrial fibrillation: Secondary | ICD-10-CM | POA: Diagnosis not present

## 2024-02-20 DIAGNOSIS — R339 Retention of urine, unspecified: Secondary | ICD-10-CM | POA: Diagnosis not present

## 2024-02-20 DIAGNOSIS — I5021 Acute systolic (congestive) heart failure: Secondary | ICD-10-CM | POA: Diagnosis not present

## 2024-02-20 DIAGNOSIS — R570 Cardiogenic shock: Secondary | ICD-10-CM | POA: Diagnosis not present

## 2024-02-20 DIAGNOSIS — N179 Acute kidney failure, unspecified: Secondary | ICD-10-CM | POA: Diagnosis not present

## 2024-02-20 DIAGNOSIS — Q228 Other congenital malformations of tricuspid valve: Secondary | ICD-10-CM

## 2024-02-20 LAB — GLUCOSE, CAPILLARY
Glucose-Capillary: 115 mg/dL — ABNORMAL HIGH (ref 70–99)
Glucose-Capillary: 117 mg/dL — ABNORMAL HIGH (ref 70–99)
Glucose-Capillary: 125 mg/dL — ABNORMAL HIGH (ref 70–99)
Glucose-Capillary: 169 mg/dL — ABNORMAL HIGH (ref 70–99)
Glucose-Capillary: 179 mg/dL — ABNORMAL HIGH (ref 70–99)
Glucose-Capillary: 98 mg/dL (ref 70–99)

## 2024-02-20 LAB — BASIC METABOLIC PANEL WITH GFR
Anion gap: 16 — ABNORMAL HIGH (ref 5–15)
Anion gap: 15 (ref 5–15)
BUN: 20 mg/dL (ref 8–23)
BUN: 19 mg/dL (ref 8–23)
CO2: 29 mmol/L (ref 22–32)
CO2: 28 mmol/L (ref 22–32)
Calcium: 6.7 mg/dL — ABNORMAL LOW (ref 8.9–10.3)
Calcium: 7.2 mg/dL — ABNORMAL LOW (ref 8.9–10.3)
Chloride: 79 mmol/L — ABNORMAL LOW (ref 98–111)
Chloride: 80 mmol/L — ABNORMAL LOW (ref 98–111)
Creatinine, Ser: 1.39 mg/dL — ABNORMAL HIGH (ref 0.44–1.00)
Creatinine, Ser: 1.41 mg/dL — ABNORMAL HIGH (ref 0.44–1.00)
GFR, Estimated: 38 mL/min — ABNORMAL LOW (ref >60)
GFR, Estimated: 38 mL/min — ABNORMAL LOW (ref >60)
Glucose, Bld: 253 mg/dL — ABNORMAL HIGH (ref 70–99)
Glucose, Bld: 185 mg/dL — ABNORMAL HIGH (ref 70–99)
Potassium: 2.7 mmol/L — CL (ref 3.5–5.1)
Potassium: 3.4 mmol/L — ABNORMAL LOW (ref 3.5–5.1)
Sodium: 124 mmol/L — ABNORMAL LOW (ref 135–145)
Sodium: 123 mmol/L — ABNORMAL LOW (ref 135–145)

## 2024-02-20 LAB — CBC
HCT: 25.6 % — ABNORMAL LOW (ref 36.0–46.0)
Hemoglobin: 8.5 g/dL — ABNORMAL LOW (ref 12.0–15.0)
MCH: 30.4 pg (ref 26.0–34.0)
MCHC: 33.2 g/dL (ref 30.0–36.0)
MCV: 91.4 fL (ref 80.0–100.0)
Platelets: 316 10*3/uL (ref 150–400)
RBC: 2.8 MIL/uL — ABNORMAL LOW (ref 3.87–5.11)
RDW: 19.3 % — ABNORMAL HIGH (ref 11.5–15.5)
WBC: 15.4 10*3/uL — ABNORMAL HIGH (ref 4.0–10.5)
nRBC: 0 % (ref 0.0–0.2)

## 2024-02-20 LAB — COOXEMETRY PANEL
Carboxyhemoglobin: 2.1 % — ABNORMAL HIGH (ref 0.5–1.5)
Methemoglobin: <0.7 % (ref 0.0–1.5)
O2 Saturation: 67.5 %
Total hemoglobin: 8.8 g/dL — ABNORMAL LOW (ref 12.0–16.0)

## 2024-02-20 LAB — DIGOXIN LEVEL: Digoxin Level: 1.4 ng/mL (ref 0.8–2.0)

## 2024-02-20 LAB — MAGNESIUM: Magnesium: 1.9 mg/dL (ref 1.7–2.4)

## 2024-02-20 LAB — SODIUM
Sodium: 123 mmol/L — ABNORMAL LOW (ref 135–145)
Sodium: 123 mmol/L — ABNORMAL LOW (ref 135–145)

## 2024-02-20 MED ORDER — DIGOXIN 125 MCG PO TABS: 0.0625 mg | ORAL_TABLET | Freq: Every day | ORAL | Status: DC

## 2024-02-20 MED ORDER — POTASSIUM CHLORIDE 10 MEQ/50ML IV SOLN
10.0000 meq | INTRAVENOUS | Status: DC
  Administered 2024-02-20: 10 meq via INTRAVENOUS
  Filled 2024-02-20: qty 50

## 2024-02-20 MED ORDER — POTASSIUM CHLORIDE CRYS ER 20 MEQ PO TBCR
40.0000 meq | EXTENDED_RELEASE_TABLET | ORAL | Status: AC
  Administered 2024-02-20 (×2): 40 meq via ORAL
  Filled 2024-02-20 (×3): qty 2

## 2024-02-20 MED ORDER — DIGOXIN 125 MCG PO TABS
0.0625 mg | ORAL_TABLET | Freq: Every day | ORAL | Status: DC
  Administered 2024-02-21 – 2024-02-24 (×4): 0.0625 mg via ORAL
  Filled 2024-02-20 (×4): qty 1

## 2024-02-20 MED ORDER — POTASSIUM CHLORIDE 10 MEQ/50ML IV SOLN
10.0000 meq | INTRAVENOUS | Status: AC
  Administered 2024-02-20 (×2): 10 meq via INTRAVENOUS
  Filled 2024-02-20 (×2): qty 50

## 2024-02-20 MED ORDER — POTASSIUM CHLORIDE 10 MEQ/50ML IV SOLN
10.0000 meq | INTRAVENOUS | Status: AC
  Administered 2024-02-20 (×5): 10 meq via INTRAVENOUS
  Filled 2024-02-20 (×5): qty 50

## 2024-02-20 MED ORDER — MAGNESIUM SULFATE 2 GM/50ML IV SOLN
2.0000 g | Freq: Once | INTRAVENOUS | Status: AC
  Administered 2024-02-20: 2 g via INTRAVENOUS
  Filled 2024-02-20: qty 50

## 2024-02-20 MED ORDER — ACETAZOLAMIDE 250 MG PO TABS
500.0000 mg | ORAL_TABLET | Freq: Once | ORAL | Status: AC
  Administered 2024-02-20: 500 mg via ORAL
  Filled 2024-02-20: qty 2

## 2024-02-20 MED ORDER — POTASSIUM CHLORIDE CRYS ER 20 MEQ PO TBCR: 40.0000 meq | EXTENDED_RELEASE_TABLET | Freq: Once | ORAL | Status: DC

## 2024-02-20 MED ORDER — SODIUM CHLORIDE 3 % IV BOLUS
150.0000 mL | Freq: Once | INTRAVENOUS | Status: AC
  Administered 2024-02-20: 150 mL via INTRAVENOUS
  Filled 2024-02-20: qty 500

## 2024-02-20 MED ORDER — POTASSIUM CHLORIDE CRYS ER 20 MEQ PO TBCR: 40.0000 meq | EXTENDED_RELEASE_TABLET | Freq: Two times a day (BID) | ORAL | Status: DC

## 2024-02-20 MED ORDER — POTASSIUM CHLORIDE 20 MEQ PO PACK: 20.0000 meq | PACK | ORAL | Status: DC

## 2024-02-20 NOTE — Progress Notes (Signed)
 [address]       [place] [address]             [phone]      12 Days Post-Op  Procedure(s) (LRB): MITRAL VALVE REPAIR USING A SIMULUS SEMI-RIGID ANNULOPLASTY BAND (N/A) MAZE PROCEDURE (N/A) CLIPPING, LEFT ATRIAL APPENDAGE USING A MEDTRONIC CLIP (N/A) ECHOCARDIOGRAM, TRANSESOPHAGEAL (N/A)   Total Length of Stay:  LOS: 12 days    SUBJECTIVE: Was reportedly in NSR while she slept. Went back into afib with ambulation  Vitals:   02/20/24 0500 02/20/24 0600  BP: (!) 105/57 (!) 103/54  Pulse: 89 94  Resp: 15 17  Temp:    SpO2: 90% 90%    Intake/Output      04/20 0701 04/21 0700 04/21 0701 04/22 0700   P.O. 100    I.V. (mL/kg) 1004.8 (13.3)    IV Piggyback 50.3    Total Intake(mL/kg) 1155.1 (15.3)    Urine (mL/kg/hr) 2975 (1.6)    Stool 0    Total Output 2975    Net -1819.9         Urine Occurrence 1 x    Stool Occurrence 7 x        amiodarone  60 mg/hr (02/20/24 0600)   furosemide  (LASIX ) 200 mg in dextrose  5 % 100 mL (2 mg/mL) infusion 12 mg/hr (02/20/24 0600)   milrinone  0.125 mcg/kg/min (02/20/24 0600)   potassium chloride  10 mEq (02/20/24 0640)   promethazine  (PHENERGAN ) injection (IM or IVPB) Stopped (02/20/24 0000)    CBC    Component Value Date/Time   WBC 15.4 (H) 02/20/2024 0541   RBC 2.80 (L) 02/20/2024 0541   HGB 8.5 (L) 02/20/2024 0541   HGB 11.1 (L) 12/05/2023 0803   HGB 12.8 05/28/2008 1551   HCT 25.6 (L) 02/20/2024 0541   HCT 36.6 05/28/2008 1551   PLT 316 02/20/2024 0541   PLT 174 12/05/2023 0803   PLT 164 05/28/2008 1551   MCV 91.4 02/20/2024 0541   MCV 89.3 05/28/2008 1551   MCH 30.4 02/20/2024 0541   MCHC 33.2 02/20/2024 0541   RDW 19.3 (H) 02/20/2024 0541   RDW 13.0 05/28/2008 1551   LYMPHSABS 1.5 12/05/2023 0803   LYMPHSABS 0.4 (L) 05/28/2008 1551   MONOABS 0.7 12/05/2023 0803   MONOABS 0.8 05/28/2008 1551   EOSABS 0.3 12/05/2023 0803   EOSABS 0.3 05/28/2008 1551   BASOSABS 0.1  12/05/2023 0803   BASOSABS 0.0 05/28/2008 1551   CMP     Component Value Date/Time   NA 124 (L) 02/20/2024 0541   NA 139 12/16/2023 1638   K 2.7 (LL) 02/20/2024 0541   CL 79 (L) 02/20/2024 0541   CO2 29 02/20/2024 0541   GLUCOSE 253 (H) 02/20/2024 0541   BUN 20 02/20/2024 0541   BUN 17 12/16/2023 1638   CREATININE 1.39 (H) 02/20/2024 0541   CALCIUM  6.7 (L) 02/20/2024 0541   PROT 6.1 (L) 02/07/2024 1042   PROT 6.4 04/29/2021 1102   ALBUMIN  3.8 02/07/2024 1042   ALBUMIN  4.4 04/29/2021 1102   AST 22 02/07/2024 1042   ALT 15 02/07/2024 1042   ALKPHOS 24 (L) 02/07/2024 1042   BILITOT 0.7 02/07/2024 1042   BILITOT 0.5 04/29/2021 1102   GFRNONAA 38 (L) 02/20/2024 0541   GFRAA >90 09/19/2011 0537   ABG    Component Value Date/Time   PHART 7.367 02/08/2024 1849   PCO2ART 41.3 02/08/2024 1849   PO2ART 130 (H) 02/08/2024 1849  HCO3 23.9 02/08/2024 1849   TCO2 25 02/08/2024 1849   ACIDBASEDEF 2.0 02/08/2024 1849   O2SAT 67.5 02/20/2024 0541   CBG (last 3)  Recent Labs    02/19/24 1937 02/19/24 2305 02/20/24 0318  GLUCAP 141* 106* 115*  EXAM Lungs: clear Card: IRR no murmur Ext: no edema Neuro: intact   ASSESSMENT: SP MV repair MAZE Will need to discuss if going to get Cardioverted today. Still on amio drip Continues on milrinone  Continues of lasix  drip. Na low WBC 15k. Follow for now Ambulate when able to    Melene Sportsman, MD 02/20/2024

## 2024-02-20 NOTE — Progress Notes (Signed)
 NAME:  [name], MRN:  [id number], DOB:  1941-04-18, LOS: 12 ADMISSION DATE:  02/08/2024, CONSULTATION DATE:/07/2024 REFERRING MD: Melene Sportsman, CHIEF COMPLAINT:/07/2024  History of Present Illness:  83 year old female with hypertension, paroxysmal A-fib who initially presented with increasing shortness of breath and bilateral leg swelling, noted to have severe mitral valve regurgitation and moderate tricuspid valve regurgitation.  Her ejection fraction was 60%, she underwent evaluation with cardiac cath which showed no coronary artery disease, today she underwent mitral valve repair with maze procedure.  Patient remained intubated and was transferred to ICU postop.  PCCM was consulted for help evaluation medical management  Pertinent  Medical History   Past Medical History:  Diagnosis Date   Acid reflux    Anemia    Arthritis    Atrial fibrillation (HCC)    Cataract    Diverticulosis of colon    Endometrial cancer (HCC)    1A grade endometrial ca   Essential hypertension 01/24/2009   Qualifier: Diagnosis of  By: Nelson-Smith CMA (AAMA), Dottie     Hx of radiation therapy 04/24/08-05/29/08& 8/12/,8/19,&06/27/08   external beam  through 7/09 ,then intracavity in 06/2008   Hypertension    Mitral valve prolapse    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   Tricuspid regurgitation      Significant Hospital Events: Including procedures, antibiotic start and stop dates in addition to other pertinent events   4/9 Mitral valve repair s/p Maze procedure, extubated 4/10 Afib RVR on amio gtt, requiring pressors  4/11 A-fib controlled with Amio drip, weaning off pressors, still having some nausea secondary to vasoplegia but improving, up to chair, plan to walk later-overall progressing 4/14 changing amio to PO after bolus, changing to PO eliquis , introducer wires and foley to dc  4/16 slp for concern of dysphagia, picc placed, coox low, milrinone / lasix  gtt 4/18 failed DCCV, dig  added  Interim History / Subjective:  C/o of diarrhea and abdominal pressure yesterday, both of which have resolved.  Seems to be in sinus on the monitor today Using IS well In bedside chair Ambulating as able.   Objective   Blood pressure (!) 107/52, pulse 81, temperature 98.1 F (36.7 C), temperature source Oral, resp. rate (!) 23, height 5\' 4"  (1.626 m), weight 75.4 kg, SpO2 90%. CVP:  [5 mmHg-12 mmHg] 8 mmHg      Intake/Output Summary (Last 24 hours) at 02/20/2024 1010 Last data filed at 02/20/2024 0800 Gross per 24 hour  Intake 1598.11 ml  Output 2625 ml  Net -1026.89 ml   Filed Weights   02/19/24 1200 02/19/24 1256 02/20/24 0600  Weight: 75.4 kg 74.8 kg 75.4 kg   Examination: General:  elderly female in NAD sitting in bedside chair.  HEENT: [place]/AT, PERRL, no JVD appreciated Neuro: Alert, oriented, non-focal CV: regular rate and rhythm in the 80s.  PULM:  Clear bilateral breath sounds GI: Soft, NT, ND Extremities: bilateral lower extremity edema.  Skin: no rashes   Labs reviewed: K 2.7, mag 1.9, cr 1.39, WBC stable 15 Coox stable CVP 10  Resolved Hospital Problem list   AcuPost-op vasoplegia te respiratory insufficiency, postop-extubated on 4/9   Assessment & Plan:   Severe MV s/p repair 4/9 Afib s/p MAZE  Acute HFrEF  HTN HLD - course c/b afib with RVR, hypervolemia, acute HFrEF (suspected 2/2 afib w/RVR) P - per TCTS and HF following - Sinus today, plans for DCCV if needed - Eliquis  - Amio, dig - ASA, statin  - trending CVP/ coox -  lasix  gtt + milrinone  per HF.  Holding lasix  today. Diamox  given - goal K> 4, Mag > 2, replete prn - goal MAP > 65 - strict I/O, weights - multimodal analgesia w/ bowel regimen - ongoing IS, pulm hygiene, mobilize/ PT  AKI Urinary retention   Hypervolemic hyponatremia Hypokalemia P - d/c foley - lasix  gtt/  BMET q12, sCr stable - replete electrolytes prn. Cannot tolerate oral potassium.  - serial renal indices/  I/Os  Leukocytosis - remains afebrile, WBC stable, suspect reactive, no clear source - completed zosyn  x 5d 4/16,  BCX neg,  P:  - WBC and fever curve stable, cont to monitor clinically off abx  Dysphagia - SLP seeing today. - maximize nutrition as able   Best Practice (right click and "Reselect all SmartList Selections" daily)   Diet/type: per SLP DVT prophylaxis: eliquis   GI prophylaxis: PPI Lines: PICC  Foley: d/c Code Status:  full code Last date of multidisciplinary goals of care discussion [per primary  ]   Roz Cornelia, AGACNP-BC [facility]  See Amion for personal pager PCCM on call pager [phone] until 7pm. Please call Elink 7p-7a. [phone]  02/20/2024 10:23 AM

## 2024-02-20 NOTE — Progress Notes (Signed)
 Speech Language Pathology Treatment: Dysphagia  Patient Details Name: [name] MRN: [id number] DOB: 24-May-1941 Today's Date: 02/20/2024 Time: 6578-4696 SLP Time Calculation (min) (ACUTE ONLY): 16 min  Assessment / Plan / Recommendation Clinical Impression  Pt continues to exhibit persistent throat clearing, dysphonia, and globus sensation during swallowing. Per nurse and pharmacist, pt is having trouble swallowing her potassium. Pt says that she had some success initially taking it with applesauce, but then had an incident in which it felt stuck and she was able to regurgitate it. Attempted it mixed into juice but this made her gag and she could not get it down.   During breakfast meal this morning she has c/o globus especially with dry textures (toast, crackers) but implements a liquid wash well to help swallow regular but more moist foods (fruit). No overt signs concerning for aspiration outside of baseline throat clearing. SLP did discuss swallow study with pt in light of ongoing symptoms and she is in agreement. Will leave on current diet for now as there are not overt signs concerning for aspiration and will f/u on subsequent date for FEES vs MBS.   HPI HPI: 83 y.o. female presents to [facility] 02/08/24 for complex MVR and MAZE (via sternotomy). Intubated for <24 hours. Per RN, pt has been coughing intermittently with PO intake, throat-clearing continuously. She c/o difficulty chewing solid foods. SLP consulted 4/17. PMHx: anemia, HTN, mitral valve prolapse, moderate mitral regurgitation, tricuspid regurgitation      SLP Plan  Continue with current plan of care      Recommendations for follow up therapy are one component of a multi-disciplinary discharge planning process, led by the attending physician.  Recommendations may be updated based on patient status, additional functional criteria and insurance authorization.    Recommendations  Diet recommendations: Regular;Thin liquid Liquids  provided via: Straw;Cup Medication Administration: Whole meds with liquid (as tolerated - may want to try at least larger pills in puree) Supervision: Patient able to self feed;Intermittent supervision to cue for compensatory strategies Compensations: Slow rate;Small sips/bites;Follow solids with liquid Postural Changes and/or Swallow Maneuvers: Seated upright 90 degrees;Upright 30-60 min after meal                  Oral care BID     Dysphagia, unspecified (R13.10)     Continue with current plan of care     Beth Brooke., M.A. CCC-SLP Acute Rehabilitation Services Office: [phone]  Secure chat preferred   02/20/2024, 10:40 AM

## 2024-02-20 NOTE — Progress Notes (Signed)
 Physical Therapy Treatment Patient Details Name: [name] MRN: [id number] DOB: 1941/10/04 Today's Date: 02/20/2024   History of Present Illness 83 y.o. female presents to [facility] 02/08/24 for complex MVR and MAZE (via sternotomy). Post-op complicated by persistent a fib RVR and volume overload. Cardioversion x2 4/18 without resolution of afib.  4/21 returned to [facility]. PMHx: anemia, HTN, mitral valve prolapse, moderate mitral regurgitation, tricuspid regurgitation    PT Comments  Patient's HR 82-86 while ambulating a total of 190 ft (100 ft, seated rest, 90 ft). Pt on RA with sats >92%. On return to room, pt with enough energy to do seated LE exercises. Continue to feel she will be able to go home with HHPT on discharge.     If plan is discharge home, recommend the following: A little help with walking and/or transfers;A little help with bathing/dressing/bathroom;Assistance with cooking/housework;Assist for transportation;Help with stairs or ramp for entrance   Can travel by private vehicle        Equipment Recommendations  None recommended by PT    Recommendations for Other Services       Precautions / Restrictions Precautions Precautions: Fall;Sternal Precaution Booklet Issued: Yes (comment) Recall of Precautions/Restrictions: Impaired Precaution/Restrictions Comments: cannot verbalize: cues to not use B UE when scooting forwards and backwards; good recall of precautions with sit to stand x2 Restrictions Other Position/Activity Restrictions: sternal     Mobility  Bed Mobility Overal bed mobility: Needs Assistance Bed Mobility: Rolling, Sidelying to Sit Rolling: Min assist Sidelying to sit: Min assist, HOB elevated       General bed mobility comments: vc for technique; assist to fully roll onto her hip and then to raise torso    Transfers Overall transfer level: Needs assistance Equipment used:  Marlea Silvers walker) Transfers: Sit to/from Stand Sit to Stand: Min assist            General transfer comment: MinA for boost-up, pt uses momentum and hands on heart pillow; stood from EOB and recliner x 3 reps    Ambulation/Gait Ambulation/Gait assistance: Editor, commissioning (Feet): 100 Feet (seated rest, 90) Assistive device: Blanca Bunch Gait Pattern/deviations: Step-through pattern, Decreased stride length Gait velocity: decr     General Gait Details: pt requested Marlea Silvers walker as she feels more secure with it and has not walked in several days due to afib RVR   Stairs             Wheelchair Mobility     Tilt Bed    Modified Rankin (Stroke Patients Only)       Balance Overall balance assessment: Needs assistance Sitting-balance support: No upper extremity supported, Feet supported Sitting balance-Leahy Scale: Fair     Standing balance support: No upper extremity supported Standing balance-Leahy Scale: Fair Standing balance comment: able to stand statically with no AD                            Communication Communication Communication: Impaired Factors Affecting Communication: Hearing impaired (has hearing aids)  Cognition Arousal: Alert Behavior During Therapy: WFL for tasks assessed/performed, Flat affect   PT - Cognitive impairments: No apparent impairments                         Following commands: Intact      Cueing Cueing Techniques: Verbal cues, Tactile cues  Exercises General Exercises - Lower Extremity Long Arc Quad: AROM, Both, Seated, 10 reps Hip  Flexion/Marching: AROM, Both, Seated, 10 reps (5 reps supported sitting; 5 reps unsupported) Heel Raises: AROM, Both, 10 reps, Seated    General Comments General comments (skin integrity, edema, etc.): Prior to ambulation pt stood and urinated with purewick not fully catching urine. After completed, pt returned to sitting and was cleaned up with briefs changed (had gotten wet). HR 82-86 sats 92% on RA      Pertinent Vitals/Pain Pain  Assessment Pain Assessment: No/denies pain Faces Pain Scale: No hurt    Home Living                          Prior Function            PT Goals (current goals can now be found in the care plan section) Acute Rehab PT Goals Patient Stated Goal: to feel better PT Goal Formulation: With patient/family Time For Goal Achievement: 02/24/24 Potential to Achieve Goals: Good Progress towards PT goals: Progressing toward goals    Frequency    Min 2X/week      PT Plan      Co-evaluation              AM-PAC PT "6 Clicks" Mobility   Outcome Measure  Help needed turning from your back to your side while in a flat bed without using bedrails?: A Little Help needed moving from lying on your back to sitting on the side of a flat bed without using bedrails?: A Little Help needed moving to and from a bed to a chair (including a wheelchair)?: A Little Help needed standing up from a chair using your arms (e.g., wheelchair or bedside chair)?: A Little Help needed to walk in hospital room?: A Little Help needed climbing 3-5 steps with a railing? : A Little 6 Click Score: 18    End of Session   Activity Tolerance: Patient limited by fatigue Patient left: in chair;with call bell/phone within reach;with family/visitor present Nurse Communication: Mobility status PT Visit Diagnosis: Unsteadiness on feet (R26.81);Muscle weakness (generalized) (M62.81);Other abnormalities of gait and mobility (R26.89)     Time: 8295-6213 PT Time Calculation (min) (ACUTE ONLY): 37 min  Charges:    $Gait Training: 23-37 mins PT General Charges $$ ACUTE PT VISIT: 1 Visit                      Gayle Kava, PT Acute Rehabilitation Services  Office [phone]    [place] Leep 02/20/2024, 3:39 PM

## 2024-02-20 NOTE — Progress Notes (Signed)
 Orthopedic Tech Progress Note Patient Details:  [name] May 26, 1941 [id number]  Ortho Devices Type of Ortho Device: Ace wrap, Unna boot Ortho Device/Splint Location: BLE Ortho Device/Splint Interventions: Ordered, Application, Adjustment   Post Interventions Patient Tolerated: Well Instructions Provided: Care of device  Kermitt Pedlar 02/20/2024, 4:39 PM

## 2024-02-20 NOTE — Progress Notes (Addendum)
 Advanced Heart Failure Rounding Note  Cardiologist: [name], MD   Chief Complaint: Acute systolic heart failure  Subjective:    - S/p MVR 02/08/24. Post-op complicated by persistent a fib RVR and volume overload. - Failed DCCV on 4/18  Remains on 0.125 milrinone . CO-OX 68%.   3L UOP last 24 hrs with lasix  gtt at 12/hr + 5 mg metolazone . Weight up 2 lb.   Cr 1.4, K 2.7, Na 124, Mag 1.9  Back in SR with frequent PACs this morning.  Had several loose bowel movements yesterday. No abdominal pain. Denies dyspnea and chest pain.  Objective:    Weight Range: 75.4 kg Body mass index is 28.53 kg/m.   Vital Signs:   Temp:  [97.9 F (36.6 C)-98.2 F (36.8 C)] 98.1 F (36.7 C) (04/21 0733) Pulse Rate:  [81-134] 100 (04/21 0700) Resp:  [12-32] 13 (04/21 0700) BP: (94-119)/(52-68) 119/57 (04/21 0700) SpO2:  [84 %-96 %] 93 % (04/21 0700) Weight:  [74.8 kg-75.4 kg] 75.4 kg (04/21 0600) Last BM Date : 02/19/24  Weight change: Filed Weights   02/19/24 1200 02/19/24 1256 02/20/24 0600  Weight: 75.4 kg 74.8 kg 75.4 kg   Intake/Output:  Intake/Output Summary (Last 24 hours) at 02/20/2024 0808 Last data filed at 02/20/2024 0700 Gross per 24 hour  Intake 1028.96 ml  Output 2875 ml  Net -1846.04 ml    Physical Exam    General:  Fatigued appearing. Sitting up in bed. Neck: JVP ~ 7 cm with prominent v waves Cor:  Regular rate & rhythm. No rubs, gallops or murmurs. Lungs: clear Abdomen: soft, nontender, nondistended.  Extremities: + UNNA Neuro: alert & orientedx3. Affect flat.   Telemetry   Sinus with frequent PACs   Labs    CBC Recent Labs    02/19/24 0440 02/20/24 0541  WBC 15.9* 15.4*  HGB 8.4* 8.5*  HCT 25.6* 25.6*  MCV 91.4 91.4  PLT 331 316   Basic Metabolic Panel Recent Labs    16/10/96 0440 02/19/24 1633 02/20/24 0541  NA 127* 126* 124*  K 3.7 3.4* 2.7*  CL 87* 85* 79*  CO2 28 27 29   GLUCOSE 133* 227* 253*  BUN 22 22 20   CREATININE 1.41*  1.31* 1.39*  CALCIUM  6.9* 7.1* 6.7*  MG 1.9  --  1.9   Imaging   DG Chest Port 1 View Result Date: 02/20/2024 CLINICAL DATA:  Evaluate atelectasis. EXAM: PORTABLE CHEST 1 VIEW COMPARISON:  02/17/2024 FINDINGS: Right arm PICC line tip terminates at the superior cavoatrial junction. Stable postsurgical changes involving the heart. The cardio mediastinal contours are unchanged. Persistent small bilateral pleural effusions. Retrocardiac opacification is unchanged in the interval. No new findings. IMPRESSION: 1. No interval change. 2. Persistent small bilateral pleural effusions and retrocardiac opacification. Electronically Signed   By: Kimberley Penman M.D.   On: 02/20/2024 07:24    Medications:    Scheduled Medications:  apixaban   5 mg Oral BID   aspirin  EC  81 mg Oral Daily   Chlorhexidine  Gluconate Cloth  6 each Topical Daily   digoxin   0.125 mg Oral Daily   ezetimibe   10 mg Oral Daily   feeding supplement  1 Container Oral TID BM   insulin  aspart  2-6 Units Subcutaneous Q4H   pantoprazole   40 mg Oral Daily   sodium chloride  flush  3-10 mL Intravenous Q12H   traMADol   50 mg Oral Q12H   Infusions:  amiodarone  60 mg/hr (02/20/24 0700)   furosemide  (LASIX )  200 mg in dextrose  5 % 100 mL (2 mg/mL) infusion 12 mg/hr (02/20/24 0700)   magnesium  sulfate bolus IVPB     milrinone  0.125 mcg/kg/min (02/20/24 0700)   potassium chloride  10 mEq (02/20/24 0744)   promethazine  (PHENERGAN ) injection (IM or IVPB) Stopped (02/20/24 0000)   PRN Medications: dextrose , loperamide , metoprolol  tartrate, morphine  injection, ondansetron  (ZOFRAN ) IV, mouth rinse, oxyCODONE , phenol, promethazine  (PHENERGAN ) injection (IM or IVPB), sodium chloride  flush, witch hazel-glycerin   Patient Profile   [name] is a 83 y.o. female with HTN and PAF. Admitted with SOB> found to have severe MR. Now s/p MVR, MAZE and LAA clipping. AHF team consulted for acute systolic heart failure.   Assessment/Plan   Acute  systolic heart failure, NiCM - Intra-op TEE 02/08/24 with EF 55-60% and severe MR - [facility] 2/25 with no CAD. RA 8, PA 41/17 (14), Fick CO/CI 7.76/4.41, PAPi 3, LVEDP 11 - Echo 4/25 with EF 35-40%, LV with GHK, RV normal, LA mod dilated, RA severely dilated, trivial MR, mod TR - Suspect drop in EF 2/2 persistent AF RVR.  - NYHA II-IIIa, IV on admission - Remains on milrinone  0.125 CO-OX 68%. - CVP 8. Weight up from pre-op but concerned we dont have much more room for diuresis. Multiple electrolyte disturbances + loose BMS.  - Stop lasix  gtt. Gave 500 mg diamox  this am, will hold off on further doses. - Dig level 1.4. Hold dig today, restart tomorrow at lower dose 0.0625 mg daily - Start spiro 12.5 mg daily  - Continue UNNA boots   Atrial fibrillation with RVR - S/p Maze and LAA clipping 4/25 - Failed DCCV X 2 04/17 - Back in SR today with very frequent PACs. Continue amio 60/hr today. - Continue eliquis  5 mg BID. Need to continue aspirin ? - Dig level 1.4 today, hold today and decrease dose tomorrow as above.   Severe MR s/p MVR - POD 11 - Intra-op TEE with severe MR 02/08/24 - Trivial MR on echo 02/14/24  4. AKI - baseline Cr normal - Cr 1.4 - stopping diuretics today - Afternoon labs  7. Hypokalemia - K 3.7 supp aggressively  - Cannot tolerate PO potassium  8. Hyponatremia - Na 124 - Suspect not able to diurese much more. IV lasix  stopped.  - Labs this afternoon  8. Debility - PT/OT  CRITICAL CARE Performed by: Gerilyn Kobus N   Total critical care time: 18 minutes  Critical care time was exclusive of separately billable procedures and treating other patients.  Critical care was necessary to treat or prevent imminent or life-threatening deterioration.  Critical care was time spent personally by me on the following activities: development of treatment plan with patient and/or surrogate as well as nursing, discussions with consultants, evaluation of patient's response  to treatment, examination of patient, obtaining history from patient or surrogate, ordering and performing treatments and interventions, ordering and review of laboratory studies, ordering and review of radiographic studies, pulse oximetry and re-evaluation of patient's condition.   Length of Stay: [address]   Abbegayle Denault N, PA-C  02/20/2024, 8:08 AM

## 2024-02-20 NOTE — Progress Notes (Signed)
 PT Cancellation Note  Patient Details Name: [name] MRN: [id number] DOB: May 31, 1941   Cancelled Treatment:    Reason Eval/Treat Not Completed: Medical issues which prohibited therapy  Pt in process of getting to chair with nursing. RN reports currently with frequent urination due to meds and recommends return later for ambulation. Will reattempt in pm.    Gayle Kava, PT Acute Rehabilitation Services  Office [phone]  [place] Leep 02/20/2024, 9:55 AM

## 2024-02-20 NOTE — Progress Notes (Signed)
 Critical K 2.7 received at 6:32 AM Primary RN Amye Baller notified immediately, he will f/u w/TCTS.

## 2024-02-21 DIAGNOSIS — I5021 Acute systolic (congestive) heart failure: Secondary | ICD-10-CM | POA: Diagnosis not present

## 2024-02-21 DIAGNOSIS — N179 Acute kidney failure, unspecified: Secondary | ICD-10-CM | POA: Diagnosis not present

## 2024-02-21 DIAGNOSIS — R339 Retention of urine, unspecified: Secondary | ICD-10-CM | POA: Diagnosis not present

## 2024-02-21 DIAGNOSIS — R570 Cardiogenic shock: Secondary | ICD-10-CM | POA: Diagnosis not present

## 2024-02-21 DIAGNOSIS — I4891 Unspecified atrial fibrillation: Secondary | ICD-10-CM | POA: Diagnosis not present

## 2024-02-21 LAB — COOXEMETRY PANEL
Carboxyhemoglobin: 1.8 % — ABNORMAL HIGH (ref 0.5–1.5)
Carboxyhemoglobin: 1.4 % (ref 0.5–1.5)
Methemoglobin: 0.7 % (ref 0.0–1.5)
Methemoglobin: <0.7 % (ref 0.0–1.5)
O2 Saturation: 67.3 %
O2 Saturation: 72.6 %
Total hemoglobin: 9 g/dL — ABNORMAL LOW (ref 12.0–16.0)
Total hemoglobin: 9.3 g/dL — ABNORMAL LOW (ref 12.0–16.0)

## 2024-02-21 LAB — BASIC METABOLIC PANEL WITH GFR
Anion gap: 11 (ref 5–15)
Anion gap: 14 (ref 5–15)
BUN: 21 mg/dL (ref 8–23)
BUN: 20 mg/dL (ref 8–23)
CO2: 28 mmol/L (ref 22–32)
CO2: 27 mmol/L (ref 22–32)
Calcium: 7.2 mg/dL — ABNORMAL LOW (ref 8.9–10.3)
Calcium: 7.5 mg/dL — ABNORMAL LOW (ref 8.9–10.3)
Chloride: 85 mmol/L — ABNORMAL LOW (ref 98–111)
Chloride: 83 mmol/L — ABNORMAL LOW (ref 98–111)
Creatinine, Ser: 1.47 mg/dL — ABNORMAL HIGH (ref 0.44–1.00)
Creatinine, Ser: 1.48 mg/dL — ABNORMAL HIGH (ref 0.44–1.00)
GFR, Estimated: 36 mL/min — ABNORMAL LOW (ref >60)
GFR, Estimated: 36 mL/min — ABNORMAL LOW (ref >60)
Glucose, Bld: 102 mg/dL — ABNORMAL HIGH (ref 70–99)
Glucose, Bld: 121 mg/dL — ABNORMAL HIGH (ref 70–99)
Potassium: 3.9 mmol/L (ref 3.5–5.1)
Potassium: 3.5 mmol/L (ref 3.5–5.1)
Sodium: 124 mmol/L — ABNORMAL LOW (ref 135–145)
Sodium: 124 mmol/L — ABNORMAL LOW (ref 135–145)

## 2024-02-21 LAB — GLUCOSE, CAPILLARY
Glucose-Capillary: 119 mg/dL — ABNORMAL HIGH (ref 70–99)
Glucose-Capillary: 152 mg/dL — ABNORMAL HIGH (ref 70–99)
Glucose-Capillary: 110 mg/dL — ABNORMAL HIGH (ref 70–99)
Glucose-Capillary: 121 mg/dL — ABNORMAL HIGH (ref 70–99)
Glucose-Capillary: 133 mg/dL — ABNORMAL HIGH (ref 70–99)

## 2024-02-21 MED ORDER — TRAMADOL HCL 50 MG PO TABS
50.0000 mg | ORAL_TABLET | Freq: Two times a day (BID) | ORAL | Status: DC | PRN
  Administered 2024-02-22 – 2024-02-27 (×4): 50 mg via ORAL
  Filled 2024-02-21 (×5): qty 1

## 2024-02-21 MED ORDER — SPIRONOLACTONE 12.5 MG HALF TABLET
12.5000 mg | ORAL_TABLET | Freq: Every day | ORAL | Status: DC
  Administered 2024-02-21: 12.5 mg via ORAL
  Filled 2024-02-21 (×2): qty 1

## 2024-02-21 MED ORDER — AMIODARONE HCL 200 MG PO TABS: 200.0000 mg | ORAL_TABLET | Freq: Two times a day (BID) | ORAL | Status: DC

## 2024-02-21 MED ORDER — POTASSIUM CHLORIDE CRYS ER 20 MEQ PO TBCR
20.0000 meq | EXTENDED_RELEASE_TABLET | Freq: Two times a day (BID) | ORAL | Status: DC
  Administered 2024-02-21: 20 meq via ORAL
  Filled 2024-02-21: qty 1

## 2024-02-21 MED ORDER — POLYETHYLENE GLYCOL 3350 17 G PO PACK
17.0000 g | PACK | Freq: Every day | ORAL | Status: DC | PRN
  Administered 2024-02-21: 17 g via ORAL
  Filled 2024-02-21: qty 1

## 2024-02-21 MED ORDER — AMIODARONE HCL 200 MG PO TABS
400.0000 mg | ORAL_TABLET | Freq: Two times a day (BID) | ORAL | Status: DC
  Administered 2024-02-21 – 2024-02-22 (×3): 400 mg via ORAL
  Filled 2024-02-21 (×3): qty 2

## 2024-02-21 NOTE — Progress Notes (Signed)
 Occupational Therapy Treatment Patient Details Name: [name] MRN: [id number] DOB: 1941-09-22 Today's Date: 02/21/2024   History of present illness 83 y.o. female presents to [facility] 02/08/24 for complex MVR and MAZE (via sternotomy). Post-op complicated by persistent a fib RVR and volume overload. Cardioversion x2 4/18 without resolution of afib.  4/21 returned to NSR. PMHx: anemia, HTN, mitral valve prolapse, moderate mitral regurgitation, tricuspid regurgitation   OT comments  Pt progressing toward established OT goals. Focus session on functional implementation of sternal precautions during ADL. Pt needing up to mod cues for functional implementation after initial education to avoid reaching behind her or abducting arms. Pt additionally benefits from min cues for problem solving during mobility. Pt overall performing ADL with min-mod A. Will continue to follow. Recommending HHOT.       If plan is discharge home, recommend the following:  A little help with walking and/or transfers;A little help with bathing/dressing/bathroom;Assistance with cooking/housework;Assist for transportation;Help with stairs or ramp for entrance   Equipment Recommendations  BSC/3in1    Recommendations for Other Services      Precautions / Restrictions Precautions Precautions: Fall;Sternal Precaution Booklet Issued: Yes (comment) Recall of Precautions/Restrictions: Impaired Precaution/Restrictions Comments: verbalized 2/4 precautions, Mod cues for functional implementation during ADLm Restrictions Other Position/Activity Restrictions: sternal       Mobility Bed Mobility Overal bed mobility: Needs Assistance Bed Mobility: Rolling, Sidelying to Sit, Sit to Supine Rolling: Min assist Sidelying to sit: Min assist, HOB elevated   Sit to supine: Min assist   General bed mobility comments: vc for technique; assist to fully roll onto her hip and then to raise torso    Transfers Overall transfer level:  Needs assistance Equipment used: Rolling walker (2 wheels), None Transfers: Sit to/from Stand Sit to Stand: Min assist           General transfer comment: light guidance for anterior weight shfit  rather than lifting assist     Balance Overall balance assessment: Needs assistance Sitting-balance support: No upper extremity supported, Feet supported Sitting balance-Leahy Scale: Fair     Standing balance support: No upper extremity supported Standing balance-Leahy Scale: Fair Standing balance comment: able to stand statically with no AD                           ADL either performed or assessed with clinical judgement   ADL Overall ADL's : Needs assistance/impaired     Grooming: Set up;Sitting;Wash/dry face;Oral care           Upper Body Dressing : Minimal assistance;Cueing for compensatory techniques;Sitting Upper Body Dressing Details (indicate cue type and reason): multiple cues for compensatory techniques; simulated. Lower Body Dressing: Moderate assistance;Cueing for compensatory techniques;Sit to/from stand Lower Body Dressing Details (indicate cue type and reason): for socks and cues for precautions with pulling up pants                    Extremity/Trunk Assessment              Vision       Perception     Praxis     Communication Communication Communication: Impaired Factors Affecting Communication: Hearing impaired (has hearing aids)   Cognition Arousal: Alert Behavior During Therapy: WFL for tasks assessed/performed, Flat affect Cognition: No apparent impairments             OT - Cognition Comments: Pt AOOx4 and pleasant throughout session; continues to benefit from cues  for new learning and functional implementation of precautions                 Following commands: Intact        Cueing   Cueing Techniques: Verbal cues, Tactile cues  Exercises      Shoulder Instructions       General Comments       Pertinent Vitals/ Pain       Pain Assessment Pain Assessment: Faces Faces Pain Scale: No hurt  Home Living                                          Prior Functioning/Environment              Frequency  Min 2X/week        Progress Toward Goals  OT Goals(current goals can now be found in the care plan section)  Progress towards OT goals: Progressing toward goals  Acute Rehab OT Goals Patient Stated Goal: go home OT Goal Formulation: With patient Time For Goal Achievement: 02/24/24 Potential to Achieve Goals: Good ADL Goals Pt Will Perform Grooming: with supervision;standing Pt Will Perform Lower Body Bathing: with contact guard assist;sitting/lateral leans;sit to/from stand Pt Will Perform Upper Body Dressing: with supervision;sitting Pt Will Perform Lower Body Dressing: with contact guard assist;sitting/lateral leans;sit to/from stand Pt Will Transfer to Toilet: with supervision;ambulating;regular height toilet Pt Will Perform Toileting - Clothing Manipulation and hygiene: with supervision;sitting/lateral leans;sit to/from stand Additional ADL Goal #1: Patient will demonstrate ability to Independently state 4 energy conservation strategies to increase safety and independence with functional tasks with handout provided.  Plan      Co-evaluation                 AM-PAC OT "6 Clicks" Daily Activity     Outcome Measure   Help from another person eating meals?: A Little Help from another person taking care of personal grooming?: A Little Help from another person toileting, which includes using toliet, bedpan, or urinal?: A Lot Help from another person bathing (including washing, rinsing, drying)?: A Lot Help from another person to put on and taking off regular upper body clothing?: A Lot Help from another person to put on and taking off regular lower body clothing?: A Lot 6 Click Score: 14    End of Session    OT Visit Diagnosis: Other  abnormalities of gait and mobility (R26.89);Muscle weakness (generalized) (M62.81);Other (comment)   Activity Tolerance Patient tolerated treatment well;Treatment limited secondary to medical complications (Comment)   Patient Left in bed;with call bell/phone within reach;with family/visitor present   Nurse Communication Mobility status;Other (comment)        Time: 1027-2536 OT Time Calculation (min): 16 min  Charges: OT General Charges $OT Visit: 1 Visit OT Treatments $Self Care/Home Management : 8-22 mins  Karilyn Ouch, OTR/L [facility] Acute Rehabilitation Office: [phone]   Emery Hans 02/21/2024, 5:44 PM

## 2024-02-21 NOTE — Progress Notes (Signed)
 Advanced Heart Failure Rounding Note  Cardiologist: [name], MD   Chief Complaint: Acute systolic heart failure  Subjective:    - S/p MVR 02/08/24. Post-op complicated by persistent a fib RVR and volume overload. - Failed DCCV on 4/18 - Back in SR 04/21. Diuretics stopped d/t hypochloremia, hypokalemia and hyponatremia. Given hypertonic saline.  Remains on 0.125 milrinone . CO-OX 67%.   CVP 8  Ambulated 190 feet with PT yesterday.   Objective:    Weight Range: 75.3 kg Body mass index is 28.49 kg/m.   Vital Signs:   Temp:  [97.6 F (36.4 C)-98.4 F (36.9 C)] 98.4 F (36.9 C) (04/21 1920) Pulse Rate:  [72-117] 79 (04/22 0700) Resp:  [12-35] 21 (04/22 0700) BP: (90-118)/(51-64) 110/52 (04/22 0700) SpO2:  [62 %-98 %] 94 % (04/22 0700) Weight:  [75.3 kg] 75.3 kg (04/22 0622) Last BM Date : 02/19/24  Weight change: Filed Weights   02/19/24 1256 02/20/24 0600 02/21/24 0622  Weight: 74.8 kg 75.4 kg 75.3 kg   Intake/Output:  Intake/Output Summary (Last 24 hours) at 02/21/2024 0754 Last data filed at 02/21/2024 0700 Gross per 24 hour  Intake 1936.81 ml  Output 1700 ml  Net 236.81 ml    Physical Exam    General: Sitting up in chair. No distress.  Neck: JVP 7-8 with prominent v waves Cor: Regular rate & rhythm. No rubs, gallops or murmurs. Lungs: clear Abdomen: soft, nontender, nondistended.  Extremities: 1+ edema Neuro: alert & orientedx3. Affect pleasant    Telemetry   SR 70s   Labs    CBC Recent Labs    02/19/24 0440 02/20/24 0541  WBC 15.9* 15.4*  HGB 8.4* 8.5*  HCT 25.6* 25.6*  MCV 91.4 91.4  PLT 331 316   Basic Metabolic Panel Recent Labs    09/81/19 0440 02/19/24 1633 02/20/24 0541 02/20/24 1325 02/20/24 1802 02/20/24 2229 02/21/24 0402  NA 127*   < > 124* 123*   < > 123* 124*  K 3.7   < > 2.7* 3.4*  --   --  3.9  CL 87*   < > 79* 80*  --   --  85*  CO2 28   < > 29 28  --   --  28  GLUCOSE 133*   < > 253* 185*  --   --  102*   BUN 22   < > 20 19  --   --  21  CREATININE 1.41*   < > 1.39* 1.41*  --   --  1.47*  CALCIUM  6.9*   < > 6.7* 7.2*  --   --  7.2*  MG 1.9  --  1.9  --   --   --   --    < > = values in this interval not displayed.   Imaging   No results found.   Medications:    Scheduled Medications:  apixaban   5 mg Oral BID   Chlorhexidine  Gluconate Cloth  6 each Topical Daily   digoxin   0.0625 mg Oral Daily   ezetimibe   10 mg Oral Daily   feeding supplement  1 Container Oral TID BM   insulin  aspart  2-6 Units Subcutaneous Q4H   pantoprazole   40 mg Oral Daily   potassium chloride   20 mEq Oral BID   sodium chloride  flush  3-10 mL Intravenous Q12H   traMADol   50 mg Oral Q12H   Infusions:  amiodarone  60 mg/hr (02/21/24 0700)   promethazine  (PHENERGAN )  injection (IM or IVPB) Stopped (02/20/24 0000)   PRN Medications: dextrose , loperamide , morphine  injection, ondansetron  (ZOFRAN ) IV, mouth rinse, oxyCODONE , phenol, promethazine  (PHENERGAN ) injection (IM or IVPB), sodium chloride  flush, witch hazel-glycerin   Patient Profile   [name] is a 83 y.o. female with HTN and PAF. Admitted with SOB> found to have severe MR. Now s/p MVR, MAZE and LAA clipping. AHF team consulted for acute systolic heart failure.   Assessment/Plan   Acute systolic heart failure, NiCM - Intra-op TEE 02/08/24 with EF 55-60% and severe MR - [facility] 2/25 with no CAD. RA 8, PA 41/17 (14), Fick CO/CI 7.76/4.41, PAPi 3, LVEDP 11 - Echo 4/25 with EF 35-40%, LV with GHK, RV normal, LA mod dilated, RA severely dilated, trivial MR, mod TR - Suspect drop in EF 2/2 persistent AF RVR.  - NYHA II-IIIa, IV on admission - CO-OX 67%. Stop milrinone . Check CO-OX this afternoon. - CVP 8. Continue to hold diuresis.  - Dig reduced to 0.0625 mg daily given elevated level. Recheck dig level on 04/24. - Start spiro 12.5 mg daily  - Continue UNNA boots   Atrial fibrillation with RVR - S/p Maze and LAA clipping 4/25 - Failed DCCV X 2  04/17 - Back in SR 04/21. Switch IV amiodarone  to po 400 BID X 5 days then 200 mg BID. - Continue eliquis  5 mg BID.    Severe MR s/p MVR - Intra-op TEE with severe MR 02/08/24 - Trivial MR on echo 02/14/24  4. AKI - baseline Cr normal - Cr 1.47 today - Remain off diuretics  7. Hypokalemia - Resolved with aggressive supplementation  8. Hyponatremia - Na 124 - Received hypertonic saline 04/21 - Continue to follow  8. Debility - PT/OT   Length of Stay: [address] Abigaelle Verley N, PA-C  02/21/2024, 7:54 AM

## 2024-02-21 NOTE — Progress Notes (Signed)
 Speech Language Pathology Treatment: Dysphagia  Patient Details Name: [name] MRN: [id number] DOB: 19-Jan-1941 Today's Date: 02/21/2024 Time: 4098-1191 SLP Time Calculation (min) (ACUTE ONLY): 16 min  Assessment / Plan / Recommendation Clinical Impression  Pt believes that her voice is about the same in terms of quality, and persistent throat clearing is still observed throughout the session. She thinks that she is swallowing better though in that things are not feeling like they are getting as stuck. She was able to swallow her oral potassium yesterday when it was broken in half and given with pudding. SLP had received a message from RN on previous date, asking if diet needed to be cut up more, so this was discussed with pt. Education was given about different diet textures, especially the difference between current diet and mechanical soft. At this point, pt declines any modifications as she thinks her symptoms are subsiding. During trials with SLP she declined solids that she felt would be too dry, so think that she should be able to self-regulate within this diet fairly well. Pt is still interested in pursuing instrumental swallow study, and given that some of her more esophageal symptoms appear to be improving, would opt for FEES to get a more direct look at the anatomy. This is tentatively scheduled for next date.     HPI HPI: 83 y.o. female presents to [facility] 02/08/24 for complex MVR and MAZE (via sternotomy). Intubated for <24 hours. Per RN, pt has been coughing intermittently with PO intake, throat-clearing continuously. She c/o difficulty chewing solid foods. SLP consulted 4/17. PMHx: anemia, HTN, mitral valve prolapse, moderate mitral regurgitation, tricuspid regurgitation      SLP Plan  Other (Comment) (FEES)      Recommendations for follow up therapy are one component of a multi-disciplinary discharge planning process, led by the attending physician.  Recommendations may be updated  based on patient status, additional functional criteria and insurance authorization.    Recommendations  Diet recommendations: Regular;Thin liquid Liquids provided via: Straw;Cup Medication Administration: Whole meds with liquid (as tolerated - may want to try at least larger pills in puree) Supervision: Patient able to self feed;Intermittent supervision to cue for compensatory strategies Compensations: Slow rate;Small sips/bites;Follow solids with liquid Postural Changes and/or Swallow Maneuvers: Seated upright 90 degrees;Upright 30-60 min after meal                  Oral care BID     Dysphagia, unspecified (R13.10)     Other (Comment) (FEES)     Beth Brooke., M.A. CCC-SLP Acute Rehabilitation Services Office: [phone]  Secure chat preferred   02/21/2024, 10:46 AM

## 2024-02-21 NOTE — Progress Notes (Addendum)
 TCTS DAILY ICU PROGRESS NOTE                   [address]            [place] [id number]          [phone]   13 Days Post-Op Procedure(s) (LRB): MITRAL VALVE REPAIR USING A SIMULUS SEMI-RIGID ANNULOPLASTY BAND (N/A) MAZE PROCEDURE (N/A) CLIPPING, LEFT ATRIAL APPENDAGE USING A MEDTRONIC CLIP (N/A) ECHOCARDIOGRAM, TRANSESOPHAGEAL (N/A)  Total Length of Stay:  LOS: 13 days   Subjective: Patient sitting in the chair without complaints this am.  Objective: Vital signs in last 24 hours: Temp:  [97.6 F (36.4 C)-98.4 F (36.9 C)] 98.4 F (36.9 C) (04/21 1920) Pulse Rate:  [72-117] 78 (04/22 0622) Cardiac Rhythm: Normal sinus rhythm (04/22 0622) Resp:  [12-35] 26 (04/22 0622) BP: (90-118)/(51-64) 102/53 (04/22 0622) SpO2:  [62 %-98 %] 91 % (04/22 0622) Weight:  [75.3 kg] 75.3 kg (04/22 0622)  Filed Weights   02/19/24 1256 02/20/24 0600 02/21/24 0622  Weight: 74.8 kg 75.4 kg 75.3 kg    Weight change: -0.1 kg   Hemodynamic parameters for last 24 hours: CVP:  [2 mmHg-20 mmHg] 2 mmHg  Intake/Output from previous day: 04/21 0701 - 04/22 0700 In: 1914.4 [P.O.:480; I.V.:860.8; IV Piggyback:573.6] Out: 1700 [Urine:1700]  Intake/Output this shift: No intake/output data recorded.  Current Meds: Scheduled Meds:  apixaban   5 mg Oral BID   Chlorhexidine  Gluconate Cloth  6 each Topical Daily   digoxin   0.0625 mg Oral Daily   ezetimibe   10 mg Oral Daily   feeding supplement  1 Container Oral TID BM   insulin  aspart  2-6 Units Subcutaneous Q4H   pantoprazole   40 mg Oral Daily   potassium chloride   20 mEq Oral BID   sodium chloride  flush  3-10 mL Intravenous Q12H   traMADol   50 mg Oral Q12H   Continuous Infusions:  amiodarone  60 mg/hr (02/21/24 0625)   milrinone  0.125 mcg/kg/min (02/21/24 1324)   promethazine  (PHENERGAN ) injection (IM or IVPB) Stopped (02/20/24 0000)   PRN Meds:.dextrose , loperamide , morphine  injection, ondansetron  (ZOFRAN )  IV, mouth rinse, oxyCODONE , phenol, promethazine  (PHENERGAN ) injection (IM or IVPB), sodium chloride  flush, witch hazel-glycerin   General appearance: alert, cooperative, and no distress Neurologic: intact Heart: RRR Lungs: Clear on the right and diminished left base Abdomen: Soft, non tender, bowel sounds present Extremities: Unna boots bilaterally Wound: Sternal wound is clean, dry, healing without signs of infection  Lab Results: CBC: Recent Labs    02/19/24 0440 02/20/24 0541  WBC 15.9* 15.4*  HGB 8.4* 8.5*  HCT 25.6* 25.6*  PLT 331 316   BMET:  Recent Labs    02/20/24 1325 02/20/24 1802 02/20/24 2229 02/21/24 0402  NA 123*   < > 123* 124*  K 3.4*  --   --  3.9  CL 80*  --   --  85*  CO2 28  --   --  28  GLUCOSE 185*  --   --  102*  BUN 19  --   --  21  CREATININE 1.41*  --   --  1.47*  CALCIUM  7.2*  --   --  7.2*   < > = values in this interval not displayed.    CMET: Lab Results  Component Value Date   WBC 15.4 (H) 02/20/2024   HGB 8.5 (L) 02/20/2024   HCT 25.6 (L) 02/20/2024   PLT 316 02/20/2024   GLUCOSE  102 (H) 02/21/2024   CHOL 104 04/29/2021   TRIG 157 (H) 04/29/2021   HDL 38 (L) 04/29/2021   LDLCALC 39 04/29/2021   ALT 15 02/07/2024   AST 22 02/07/2024   NA 124 (L) 02/21/2024   K 3.9 02/21/2024   CL 85 (L) 02/21/2024   CREATININE 1.47 (H) 02/21/2024   BUN 21 02/21/2024   CO2 28 02/21/2024   TSH 3.482 12/30/2023   INR 1.6 (H) 02/08/2024   HGBA1C 4.7 (L) 02/07/2024      PT/INR: No results for input(s): "LABPROT", "INR" in the last 72 hours. Radiology: No results found.   Assessment/Plan: S/P Procedure(s) (LRB): MITRAL VALVE REPAIR USING A SIMULUS SEMI-RIGID ANNULOPLASTY BAND (N/A) MAZE PROCEDURE (N/A) CLIPPING, LEFT ATRIAL APPENDAGE USING A MEDTRONIC CLIP (N/A) ECHOCARDIOGRAM, TRANSESOPHAGEAL (N/A) CV-A fib with RVR. S/p DCCV 04/18 (failed). SR this am. On Amiodarone  and Milrinone  (0.125) drips. Also, on  Digoxin  0.0625  daily, and Apixaban  5 mg bid. Co ox this am 67.3. Per Dr. [name], stop Milrinone  drip. Pulmonary-on room air. Encourage incentive spirometer. Acute systolic heart failure, non ischemic cardiomyopathy-on Unna boots. Has PICC line for CVP (and co ox). CVP 5 this am. AHF following. Expected post op blood loss anemia-H and H this am 8.5 and 25.6 CBGs 98/119/152. Pre op HGA1C 4.7. Will stop accu checks and SS PRN upon transfer to the floor 6. Hyponatremia-sodium this am 124 (has been 123-124 mostly of late). Lasix  stopped 7. Creatinine this am 1.47. Creatinine prior to admission 1.15. 8. Deconditioned-continue PT  Allegra Arch PA-C 02/21/2024 7:24 AM

## 2024-02-21 NOTE — Progress Notes (Signed)
 NAME:  [name], MRN:  [id number], DOB:  [birth year], LOS: 13 ADMISSION DATE:  02/08/2024, CONSULTATION DATE:/07/2024 REFERRING MD: Melene Sportsman, CHIEF COMPLAINT:/07/2024  History of Present Illness:  83 year old female with hypertension, paroxysmal A-fib who initially presented with increasing shortness of breath and bilateral leg swelling, noted to have severe mitral valve regurgitation and moderate tricuspid valve regurgitation.  Her ejection fraction was 60%, she underwent evaluation with cardiac cath which showed no coronary artery disease, today she underwent mitral valve repair with maze procedure.  Patient remained intubated and was transferred to ICU postop.  PCCM was consulted for help evaluation medical management  Pertinent  Medical History   Past Medical History:  Diagnosis Date   Acid reflux    Anemia    Arthritis    Atrial fibrillation (HCC)    Cataract    Diverticulosis of colon    Endometrial cancer (HCC)    1A grade endometrial ca   Essential hypertension 01/24/2009   Qualifier: Diagnosis of  By: Nelson-Smith CMA (AAMA), Dottie     Hx of radiation therapy 04/24/08-05/29/08& 8/12/,8/19,&06/27/08   external beam  through 7/09 ,then intracavity in 06/2008   Hypertension    Mitral valve prolapse    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   Tricuspid regurgitation      Significant Hospital Events: Including procedures, antibiotic start and stop dates in addition to other pertinent events   4/9 Mitral valve repair s/p Maze procedure, extubated 4/10 Afib RVR on amio gtt, requiring pressors  4/11 A-fib controlled with Amio drip, weaning off pressors, still having some nausea secondary to vasoplegia but improving, up to chair, plan to walk later-overall progressing 4/14 changing amio to PO after bolus, changing to PO eliquis , introducer wires and foley to dc  4/16 slp for concern of dysphagia, picc placed, coox low, milrinone / lasix  gtt 4/18 failed DCCV, dig added 4/21  chemically converted to sinus.   Interim History / Subjective:  No complaints. Feeling better.   Objective   Blood pressure (!) 103/55, pulse 79, temperature 98.1 F (36.7 C), temperature source Oral, resp. rate (!) 27, height 5\' 4"  (1.626 m), weight 75.3 kg, SpO2 97%. CVP:  [2 mmHg-20 mmHg] 2 mmHg      Intake/Output Summary (Last 24 hours) at 02/21/2024 0857 Last data filed at 02/21/2024 0800 Gross per 24 hour  Intake 1402.35 ml  Output 1050 ml  Net 352.35 ml   Filed Weights   02/19/24 1256 02/20/24 0600 02/21/24 0622  Weight: 74.8 kg 75.4 kg 75.3 kg   Examination: General:  elderly female in NAD HEENT: /AT, PERRL, no JVD Neuro: Alert, oriented, non-focal CV: RRR, rates 80s.  PULM:  Clear, no distress.  GI: Soft, NT, ND Extremities: bilateral lower extremity edema.  Skin: no rashes, sternal incision site looks good.   Labs reviewed: Na 124, K 3.9, mag 1.9, cr 1.47 Coox stable 67 CVP 3  Resolved Hospital Problem list   AcuPost-op vasoplegia te respiratory insufficiency, postop-extubated on 4/9   Assessment & Plan:   Severe MV s/p repair 4/9 Afib s/p MAZE  Acute HFrEF  HTN HLD - course c/b afib with RVR, hypervolemia, acute HFrEF (suspected 2/2 afib w/RVR) P - per TCTS and HF following - Sinus today, plans for DCCV if needed - Eliquis  - Amio, dig per cards - ASA, statin  - trending CVP/ coox - lasix  infusion and milrinone  both off off.  - goal K> 4, Mag > 2, replete prn - goal MAP > 65 -  strict I/O, weights - multimodal analgesia w/ bowel regimen - ongoing IS, pulm hygiene, mobilize/ PT  AKI Urinary retention   Hypervolemic hyponatremia Hypokalemia P - Trend chemistry with ongoing diuresis.  - replete electrolytes prn. Cannot tolerate oral potassium.  - Strict I&O  Leukocytosis - remains afebrile, WBC stable, suspect reactive, no clear source - completed zosyn  x 5d 4/16,  BCX neg,  P:  - WBC and fever curve stable, continue to monitor  clinically off abx  Dysphagia - SLP eval yesterday, no changes. Did not feel there was an aspiration risk.    Best Practice (right click and "Reselect all SmartList Selections" daily)   Diet/type: per SLP DVT prophylaxis: eliquis   GI prophylaxis: PPI Lines: PICC  Foley: d/c Code Status:  full code Last date of multidisciplinary goals of care discussion [per primary  ]   Roz Cornelia, AGACNP-BC [facility]  See Amion for personal pager PCCM on call pager [phone] until 7pm. Please call Elink 7p-7a. [phone]  02/21/2024 8:57 AM

## 2024-02-22 ENCOUNTER — Inpatient Hospital Stay (HOSPITAL_COMMUNITY)

## 2024-02-22 DIAGNOSIS — J90 Pleural effusion, not elsewhere classified: Secondary | ICD-10-CM

## 2024-02-22 DIAGNOSIS — I340 Nonrheumatic mitral (valve) insufficiency: Secondary | ICD-10-CM | POA: Diagnosis not present

## 2024-02-22 DIAGNOSIS — I5021 Acute systolic (congestive) heart failure: Secondary | ICD-10-CM | POA: Diagnosis not present

## 2024-02-22 DIAGNOSIS — R0600 Dyspnea, unspecified: Secondary | ICD-10-CM

## 2024-02-22 DIAGNOSIS — J810 Acute pulmonary edema: Secondary | ICD-10-CM

## 2024-02-22 DIAGNOSIS — I4891 Unspecified atrial fibrillation: Secondary | ICD-10-CM | POA: Diagnosis not present

## 2024-02-22 LAB — BASIC METABOLIC PANEL WITH GFR
Anion gap: 11 (ref 5–15)
Anion gap: 11 (ref 5–15)
BUN: 19 mg/dL (ref 8–23)
BUN: 20 mg/dL (ref 8–23)
CO2: 27 mmol/L (ref 22–32)
CO2: 27 mmol/L (ref 22–32)
Calcium: 7.3 mg/dL — ABNORMAL LOW (ref 8.9–10.3)
Calcium: 7.7 mg/dL — ABNORMAL LOW (ref 8.9–10.3)
Chloride: 85 mmol/L — ABNORMAL LOW (ref 98–111)
Chloride: 86 mmol/L — ABNORMAL LOW (ref 98–111)
Creatinine, Ser: 1.44 mg/dL — ABNORMAL HIGH (ref 0.44–1.00)
Creatinine, Ser: 1.37 mg/dL — ABNORMAL HIGH (ref 0.44–1.00)
GFR, Estimated: 37 mL/min — ABNORMAL LOW (ref >60)
GFR, Estimated: 39 mL/min — ABNORMAL LOW (ref >60)
Glucose, Bld: 97 mg/dL (ref 70–99)
Glucose, Bld: 122 mg/dL — ABNORMAL HIGH (ref 70–99)
Potassium: 3.3 mmol/L — ABNORMAL LOW (ref 3.5–5.1)
Potassium: 3.2 mmol/L — ABNORMAL LOW (ref 3.5–5.1)
Sodium: 123 mmol/L — ABNORMAL LOW (ref 135–145)
Sodium: 124 mmol/L — ABNORMAL LOW (ref 135–145)

## 2024-02-22 LAB — CBC
HCT: 27.1 % — ABNORMAL LOW (ref 36.0–46.0)
Hemoglobin: 8.8 g/dL — ABNORMAL LOW (ref 12.0–15.0)
MCH: 29.3 pg (ref 26.0–34.0)
MCHC: 32.5 g/dL (ref 30.0–36.0)
MCV: 90.3 fL (ref 80.0–100.0)
Platelets: 336 10*3/uL (ref 150–400)
RBC: 3 MIL/uL — ABNORMAL LOW (ref 3.87–5.11)
RDW: 18.6 % — ABNORMAL HIGH (ref 11.5–15.5)
WBC: 16.9 10*3/uL — ABNORMAL HIGH (ref 4.0–10.5)
nRBC: 0 % (ref 0.0–0.2)

## 2024-02-22 LAB — COOXEMETRY PANEL
Carboxyhemoglobin: 1.6 % — ABNORMAL HIGH (ref 0.5–1.5)
Carboxyhemoglobin: 1.8 % — ABNORMAL HIGH (ref 0.5–1.5)
Methemoglobin: <0.7 % (ref 0.0–1.5)
Methemoglobin: <0.7 % (ref 0.0–1.5)
O2 Saturation: 49.7 %
O2 Saturation: 56.3 %
Total hemoglobin: 9 g/dL — ABNORMAL LOW (ref 12.0–16.0)
Total hemoglobin: 9.1 g/dL — ABNORMAL LOW (ref 12.0–16.0)

## 2024-02-22 LAB — GLUCOSE, CAPILLARY
Glucose-Capillary: 100 mg/dL — ABNORMAL HIGH (ref 70–99)
Glucose-Capillary: 102 mg/dL — ABNORMAL HIGH (ref 70–99)
Glucose-Capillary: 122 mg/dL — ABNORMAL HIGH (ref 70–99)
Glucose-Capillary: 136 mg/dL — ABNORMAL HIGH (ref 70–99)

## 2024-02-22 LAB — MAGNESIUM: Magnesium: 2.1 mg/dL (ref 1.7–2.4)

## 2024-02-22 LAB — BRAIN NATRIURETIC PEPTIDE: B Natriuretic Peptide: 568.9 pg/mL — ABNORMAL HIGH (ref 0.0–100.0)

## 2024-02-22 LAB — PROCALCITONIN: Procalcitonin: <0.1 ng/mL

## 2024-02-22 LAB — LACTIC ACID, PLASMA: Lactic Acid, Venous: 0.9 mmol/L (ref 0.5–1.9)

## 2024-02-22 MED ORDER — AMIODARONE HCL 200 MG PO TABS
200.0000 mg | ORAL_TABLET | Freq: Every day | ORAL | Status: DC
Start: 2024-02-24 — End: 2024-02-23

## 2024-02-22 MED ORDER — ~~LOC~~ CARDIAC SURGERY, PATIENT & FAMILY EDUCATION
Freq: Once | Status: DC
Start: 2024-02-22 — End: 2024-03-10

## 2024-02-22 MED ORDER — SPIRONOLACTONE 25 MG PO TABS
25.0000 mg | ORAL_TABLET | Freq: Every day | ORAL | Status: DC
  Administered 2024-02-22 – 2024-03-10 (×18): 25 mg via ORAL
  Filled 2024-02-22 (×19): qty 1

## 2024-02-22 MED ORDER — AMIODARONE HCL 200 MG PO TABS
400.0000 mg | ORAL_TABLET | Freq: Two times a day (BID) | ORAL | Status: AC
  Administered 2024-02-22 – 2024-02-23 (×2): 400 mg via ORAL
  Filled 2024-02-22 (×2): qty 2

## 2024-02-22 MED ORDER — POTASSIUM CHLORIDE CRYS ER 20 MEQ PO TBCR: 30.0000 meq | EXTENDED_RELEASE_TABLET | Freq: Two times a day (BID) | ORAL | Status: DC

## 2024-02-22 MED ORDER — POTASSIUM CHLORIDE CRYS ER 10 MEQ PO TBCR
30.0000 meq | EXTENDED_RELEASE_TABLET | Freq: Two times a day (BID) | ORAL | Status: DC
  Administered 2024-02-22: 30 meq via ORAL
  Filled 2024-02-22: qty 3

## 2024-02-22 MED ORDER — ENSURE ENLIVE PO LIQD
237.0000 mL | Freq: Two times a day (BID) | ORAL | Status: DC
  Administered 2024-02-22 – 2024-02-29 (×10): 237 mL via ORAL

## 2024-02-22 MED ORDER — SODIUM CHLORIDE 0.9% FLUSH
3.0000 mL | Freq: Two times a day (BID) | INTRAVENOUS | Status: DC
  Administered 2024-02-22 (×2): 3 mL via INTRAVENOUS

## 2024-02-22 MED ORDER — ACETAZOLAMIDE 250 MG PO TABS
500.0000 mg | ORAL_TABLET | Freq: Once | ORAL | Status: AC
  Administered 2024-02-22: 500 mg via ORAL
  Filled 2024-02-22: qty 2

## 2024-02-22 MED ORDER — ADULT MULTIVITAMIN W/MINERALS CH
1.0000 | ORAL_TABLET | Freq: Every day | ORAL | Status: DC
  Administered 2024-02-22 – 2024-03-10 (×18): 1 via ORAL
  Filled 2024-02-22 (×19): qty 1

## 2024-02-22 MED ORDER — FUROSEMIDE 10 MG/ML IJ SOLN
80.0000 mg | Freq: Once | INTRAMUSCULAR | Status: AC
  Administered 2024-02-22: 80 mg via INTRAVENOUS
  Filled 2024-02-22: qty 8

## 2024-02-22 MED ORDER — ATORVASTATIN CALCIUM 10 MG PO TABS
10.0000 mg | ORAL_TABLET | Freq: Every day | ORAL | Status: DC
  Administered 2024-02-22 – 2024-03-10 (×18): 10 mg via ORAL
  Filled 2024-02-22 (×18): qty 1

## 2024-02-22 MED ORDER — SODIUM CHLORIDE 0.9 % IV SOLN: 250.0000 mL | INTRAVENOUS | Status: DC | PRN

## 2024-02-22 MED ORDER — POTASSIUM CHLORIDE CRYS ER 20 MEQ PO TBCR
40.0000 meq | EXTENDED_RELEASE_TABLET | ORAL | Status: AC
Start: 2024-02-22 — End: 2024-02-22
  Administered 2024-02-22 (×2): 40 meq via ORAL
  Filled 2024-02-22 (×2): qty 2

## 2024-02-22 MED ORDER — SODIUM CHLORIDE 0.9% FLUSH: 3.0000 mL | INTRAVENOUS | Status: DC | PRN

## 2024-02-22 MED ORDER — POTASSIUM CHLORIDE CRYS ER 20 MEQ PO TBCR
40.0000 meq | EXTENDED_RELEASE_TABLET | Freq: Once | ORAL | Status: AC
  Administered 2024-02-22: 40 meq via ORAL
  Filled 2024-02-22: qty 2

## 2024-02-22 NOTE — Progress Notes (Signed)
 Initial Nutrition Assessment  DOCUMENTATION CODES:   Not applicable  INTERVENTION:  Discontinue Boost Breeze, Ensure Enlive po BID, each supplement provides 350 kcal and 20 grams of protein. Multivitamin w/ minerals daily Liberalize pt diet to 2 gm Sodium due to poor PO intake Encourage good PO intake  NUTRITION DIAGNOSIS:   Increased nutrient needs related to acute illness as evidenced by estimated needs.  GOAL:   Patient will meet greater than or equal to 90% of their needs  MONITOR:   PO intake, Supplement acceptance, I & O's, Labs  REASON FOR ASSESSMENT:   Consult Assessment of nutrition requirement/status, Poor PO  ASSESSMENT:   83 y.o. presented for mitral valve repair surgery and remained intubated post-op. PMH includes A. Fib, anemia, and HTN.   4/09 - Op, s/p Mitral Valve repair; Admitted; extubated 4/18 - s/p DCCV 4/21 - SLP evaluation, recommend regular/thin liquids 4/23 - s.p FEES; transferred to floor  Pt sitting up in bed, eating lunch at time of RD visit. Pt husband at bedside. Reports good appetite at home PTA, improving here - enjoying the fresh fruit cup. Does not typically drink oral nutrition supplements at home. Discussed importance of proper nutrition to maintain strength and lean muscle mass. Pt agreeable to trying Ensure to help with PO intake.   Meal Intake  4/12: 0% x 1 meal 4/18: 10% x 1 meal  4/19: 25% x 1 meal 4/20: 20% x 1 meal 4/21: 0-100% x 1 meals   Pt reports a stable weight of 151# prior to admission with no weight changes. During admission, pt with fluid retention and fluctuation in weight.   Admission Weight: 68.5 kg  Current Weight: 76.2 kg   Medications: NovoLog  SSI, Protonix , Potassium Chloride , Spironolactone  Labs: Sodium 123, Potassium 3.3, BUN 19, Creatinine 1.44, Magnesium  2.1, Hgb A1c 4.7 CBG: 100-136 x 24 hrs   UOP: 550 mL + 1 unmeasured occurrence x 24 hrs   NUTRITION - FOCUSED PHYSICAL EXAM:  Deferred to  follow-up due to pt eating lunch.  Diet Order:   Diet Order             Diet 2 gram sodium Room service appropriate? Yes; Fluid consistency: Thin  Diet effective now                   EDUCATION NEEDS:   No education needs have been identified at this time  Skin:  Skin Assessment: Reviewed RN Assessment  Last BM:  4/20  Height:  Ht Readings from Last 1 Encounters:  02/08/24 5\' 4"  (1.626 m)   Weight:  Wt Readings from Last 1 Encounters:  02/22/24 76.2 kg   Ideal Body Weight:  54.6 kg  BMI:  Body mass index is 28.84 kg/m.  Estimated Nutritional Needs:  Kcal:  1800-2000 Protein:  90-110 grams Fluid:  >/= 1.8 L   Doneta Furbish RD, LDN Clinical Dietitian

## 2024-02-22 NOTE — Progress Notes (Signed)
 Advanced Heart Failure Rounding Note  Cardiologist: [name], MD   Chief Complaint: Acute systolic heart failure  Subjective:    - S/p MVR 02/08/24. Post-op complicated by persistent a fib RVR and volume overload. - Failed DCCV on 4/18 - Back in SR 04/21. Diuretics stopped d/t hypochloremia, hypokalemia and hyponatremia. Given hypertonic saline. - Milrinone  stopped 04/22  CO-OX 56% with assumed Fick CI of 2.7 L/min/m2. Remains off milrinone .  CVP 8  More short of breath today. Weight creeping up. Appetite okay.    Objective:    Weight Range: 76.2 kg Body mass index is 28.84 kg/m.   Vital Signs:   Temp:  [98 F (36.7 C)-98.4 F (36.9 C)] 98 F (36.7 C) (04/23 0700) Pulse Rate:  [70-88] 84 (04/23 0700) Resp:  [11-33] 28 (04/23 0700) BP: (86-115)/(50-93) 113/56 (04/23 0700) SpO2:  [81 %-96 %] 93 % (04/23 0700) Weight:  [76.2 kg] 76.2 kg (04/23 0500) Last BM Date : 02/19/24  Weight change: Filed Weights   02/20/24 0600 02/21/24 0622 02/22/24 0500  Weight: 75.4 kg 75.3 kg 76.2 kg   Intake/Output:  Intake/Output Summary (Last 24 hours) at 02/22/2024 0819 Last data filed at 02/21/2024 2000 Gross per 24 hour  Intake 367.97 ml  Output 350 ml  Net 17.97 ml    Physical Exam    General:  Fatigued appearing sitting up in chair. Neck: JVP 7-8 with prominent v waves Cor: Regular rate & rhythm. No rubs, gallops or murmurs. Lungs: + crackles Abdomen: soft, nontender, nondistended.  Extremities: 1-2+ edema, + UNNA Neuro: alert & orientedx3. Affect pleasant     Telemetry   SR 70s  Labs    CBC Recent Labs    02/20/24 0541  WBC 15.4*  HGB 8.5*  HCT 25.6*  MCV 91.4  PLT 316   Basic Metabolic Panel Recent Labs    53/66/44 0541 02/20/24 1325 02/21/24 1605 02/22/24 0524  NA 124*   < > 124* 123*  K 2.7*   < > 3.5 3.3*  CL 79*   < > 83* 85*  CO2 29   < > 27 27  GLUCOSE 253*   < > 121* 97  BUN 20   < > 20 19  CREATININE 1.39*   < > 1.48* 1.44*   CALCIUM  6.7*   < > 7.5* 7.3*  MG 1.9  --   --  2.1   < > = values in this interval not displayed.   Imaging   No results found.   Medications:    Scheduled Medications:  amiodarone   400 mg Oral BID   Followed by   Cecily Cohen ON 02/23/2024] amiodarone   200 mg Oral BID   apixaban   5 mg Oral BID   Chlorhexidine  Gluconate Cloth  6 each Topical Daily   digoxin   0.0625 mg Oral Daily   ezetimibe   10 mg Oral Daily   feeding supplement  1 Container Oral TID BM   insulin  aspart  2-6 Units Subcutaneous Q4H   pantoprazole   40 mg Oral Daily   potassium chloride   30 mEq Oral BID   sodium chloride  flush  3-10 mL Intravenous Q12H   spironolactone   12.5 mg Oral Daily   Infusions:  promethazine  (PHENERGAN ) injection (IM or IVPB) Stopped (02/20/24 0000)   PRN Medications: dextrose , loperamide , ondansetron  (ZOFRAN ) IV, mouth rinse, oxyCODONE , phenol, polyethylene glycol, promethazine  (PHENERGAN ) injection (IM or IVPB), sodium chloride  flush, traMADol , witch hazel-glycerin   Patient Profile   [name] is a  83 y.o. female with HTN and PAF. Admitted with SOB> found to have severe MR. Now s/p MVR, MAZE and LAA clipping. AHF team consulted for acute systolic heart failure.   Assessment/Plan   Acute systolic heart failure, NiCM - Intra-op TEE 02/08/24 with EF 55-60% and severe MR - [facility] 2/25 with no CAD. RA 8, PA 41/17 (14), Fick CO/CI 7.76/4.41, PAPi 3, LVEDP 11 - Echo 4/25 with EF 35-40%, LV with GHK, RV normal, LA mod dilated, RA severely dilated, trivial MR, mod TR - Suspect drop in EF 2/2 persistent AF RVR.  - NYHA II-IIIa, IV on admission - CO-OX 56% off milrinone  with assumed CI 2.7.  - More short of breath today. CVP 8. Give 80 mg lasix  IV - Dig reduced to 0.0625 mg daily given elevated level. Recheck dig level on 04/24. - Increase spiro to 25 mg daily - Continue UNNA boots   Atrial fibrillation with RVR - S/p Maze and LAA clipping 4/25 - Failed DCCV X 2 04/17 - Back in SR 04/21.  Continue po amiodarone  400 BID, cut down to 200 BID tomorrow - Continue eliquis  5 mg BID.    Severe MR s/p MVR - Intra-op TEE with severe MR 02/08/24 - Trivial MR on echo 02/14/24  4. AKI - baseline Cr normal - Cr stable ~ 1.4 today - Watch with diuresis  7. Hypokalemia - Supp aggressively with diuresis  8. Hyponatremia - Na 123 - Received hypertonic saline 04/21 - Continue to follow  8. Debility - Continue aggressive PT/OT   Length of Stay: 14   Heavenleigh Petruzzi N, PA-C  02/22/2024, 8:19 AM

## 2024-02-22 NOTE — Progress Notes (Signed)
 Patient converted back into a-fib HR 109-115 at time of transfer to 4E. Attending and consulting physicians made aware. Patient okay to still transfer to 4E.

## 2024-02-22 NOTE — Procedures (Signed)
 Objective Swallowing Evaluation: Type of Study: FEES-Fiberoptic Endoscopic Evaluation of Swallow   Patient Details  Name: [name] MRN: [id number] Date of Birth: 02-11-1941  Today's Date: 02/22/2024 Time: SLP Start Time (ACUTE ONLY): 1042 -SLP Stop Time (ACUTE ONLY): 1118  SLP Time Calculation (min) (ACUTE ONLY): 36 min   Past Medical History:  Past Medical History:  Diagnosis Date   Acid reflux    Anemia    Arthritis    Atrial fibrillation (HCC)    Cataract    Diverticulosis of colon    Endometrial cancer (HCC)    1A grade endometrial ca   Essential hypertension 01/24/2009   Qualifier: Diagnosis of  By: Nelson-Smith CMA (AAMA), Dottie     Hx of radiation therapy 04/24/08-05/29/08& 8/12/,8/19,&06/27/08   external beam  through 7/09 ,then intracavity in 06/2008   Hypertension    Mitral valve prolapse    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   Tricuspid regurgitation    Past Surgical History:  Past Surgical History:  Procedure Laterality Date   APPENDECTOMY     1967   arthroscopic knee right surgery     CLIPPING OF ATRIAL APPENDAGE N/A 02/08/2024   Procedure: CLIPPING, LEFT ATRIAL APPENDAGE USING A MEDTRONIC CLIP;  Surgeon: Melene Sportsman, MD;  Location: [facility] OR;  Service: Open Heart Surgery;  Laterality: N/A;   ECTOPIC PREGNANCY SURGERY  1967   iron  infusion  12/2022   LAPAROSCOPIC HYSTERECTOMY     total   MAZE N/A 02/08/2024   Procedure: MAZE PROCEDURE;  Surgeon: Melene Sportsman, MD;  Location: [facility] OR;  Service: Open Heart Surgery;  Laterality: N/A;   MITRAL VALVE REPAIR N/A 02/08/2024   Procedure: MITRAL VALVE REPAIR USING A SIMULUS SEMI-RIGID ANNULOPLASTY BAND;  Surgeon: Melene Sportsman, MD;  Location: [facility] OR;  Service: Open Heart Surgery;  Laterality: N/A;  possible replacement   ORTHOPEDIC SURGERY     Orthoscopic Knee Surgery   RIGHT/LEFT HEART CATH AND CORONARY ANGIOGRAPHY N/A 12/30/2023   Procedure: RIGHT/LEFT HEART CATH AND CORONARY ANGIOGRAPHY;  Surgeon: [name], MD;  Location: [facility] INVASIVE CV LAB;  Service: Cardiovascular;  Laterality: N/A;   TEAR DUCT PROBING     TEE WITHOUT CARDIOVERSION N/A 02/08/2024   Procedure: ECHOCARDIOGRAM, TRANSESOPHAGEAL;  Surgeon: Melene Sportsman, MD;  Location: [facility] OR;  Service: Open Heart Surgery;  Laterality: N/A;   TOOTH EXTRACTION     bone graft   TRANSESOPHAGEAL ECHOCARDIOGRAM (CATH LAB) N/A 12/30/2023   Procedure: TRANSESOPHAGEAL ECHOCARDIOGRAM;  Surgeon: Euell Herrlich, MD;  Location[facility] INVASIVE CV LAB;  Service: Cardiovascular;  Laterality: N/A;   HPI: 83 y.o. female presents to [facility] 02/08/24 for complex MVR and MAZE (via sternotomy). Intubated for <24 hours. Per RN, pt has been coughing intermittently with PO intake, throat-clearing continuously. She c/o difficulty chewing solid foods. SLP consulted 4/17. PMHx: anemia, HTN, mitral valve prolapse, moderate mitral regurgitation, tricuspid regurgitation   Subjective: thinks her swallowing and her voice are about the same    Recommendations for follow up therapy are one component of a multi-disciplinary discharge planning process, led by the attending physician.  Recommendations may be updated based on patient status, additional functional criteria and insurance authorization.  Assessment / Plan / Recommendation     02/22/2024   11:00 AM  Clinical Impressions  Clinical Impression Pt has a functional oropharyngeal swallow with trace residue that clears spontaneously and complete airway protection. Note that throat clearing started this morning after initiation of POs but was not related to any  penetration or aspiration.   From an anatomical standpoint, pt had diffuse erythema that may be suggestive of reflux. Pt endorses a h/o reflux, and although no overt backflow was noted during the study, question if her persistent throat clearing and globus sensation may be related. Pt also appeared to have mild bowing of the posterior vocal folds bilaterally, giving the  appearance of where the ETT may have been despite brief placement, as well as some mild curvature throughout the length of the vocal folds that could be more age-related changes. Would need stroboscopy to confirm but this, as well as increased tension noted during non-swallowing tasks and irritation from reflux, could all contribute to her dysphonia.    SLP Visit Diagnosis Dysphagia, unspecified (R13.10)  Attention and concentration deficit following --  Frontal lobe and executive function deficit following --  Impact on safety and function No limitations         02/22/2024   11:00 AM  Treatment Recommendations  Treatment Recommendations Therapy as outlined in treatment plan below        02/22/2024   11:00 AM  Prognosis  Prognosis for improved oropharyngeal function Good  Barriers to Reach Goals --  Barriers/Prognosis Comment --       02/22/2024   11:00 AM  Diet Recommendations  SLP Diet Recommendations Regular solids;Thin liquid  Liquid Administration via Cup;Straw  Medication Administration --  Compensations Slow rate;Small sips/bites;Follow solids with liquid  Postural Changes Remain semi-upright after after feeds/meals (Comment);Seated upright at 90 degrees         02/22/2024   11:00 AM  Other Recommendations  Recommended Consults Consider esophageal assessment  Oral Care Recommendations Oral care BID  Caregiver Recommendations --  Follow Up Recommendations Home health SLP  Assistance recommended at discharge --  Functional Status Assessment Patient has had a recent decline in their functional status and demonstrates the ability to make significant improvements in function in a reasonable and predictable amount of time.       02/22/2024   11:00 AM  Frequency and Duration   Speech Therapy Frequency (ACUTE ONLY) min 2x/week  Treatment Duration 1 week         02/22/2024   11:00 AM  Oral Phase  Oral Phase WFL  Oral - Pudding Teaspoon --  Oral - Pudding Cup --   Oral - Honey Teaspoon --  Oral - Honey Cup --  Oral - Nectar Teaspoon --  Oral - Nectar Cup --  Oral - Nectar Straw --  Oral - Thin Teaspoon --  Oral - Thin Cup --  Oral - Thin Straw --  Oral - Puree --  Oral - Mech Soft --  Oral - Regular --  Oral - Multi-Consistency --  Oral - Pill --  Oral Phase - Comment --       02/22/2024   11:00 AM  Pharyngeal Phase  Pharyngeal Phase WFL  Pharyngeal- Pudding Teaspoon --  Pharyngeal --  Pharyngeal- Pudding Cup --  Pharyngeal --  Pharyngeal- Honey Teaspoon --  Pharyngeal --  Pharyngeal- Honey Cup --  Pharyngeal --  Pharyngeal- Nectar Teaspoon --  Pharyngeal --  Pharyngeal- Nectar Cup --  Pharyngeal --  Pharyngeal- Nectar Straw --  Pharyngeal --  Pharyngeal- Thin Teaspoon --  Pharyngeal --  Pharyngeal- Thin Cup --  Pharyngeal --  Pharyngeal- Thin Straw --  Pharyngeal --  Pharyngeal- Puree --  Pharyngeal --  Pharyngeal- Mechanical Soft --  Pharyngeal --  Pharyngeal- Regular --  Pharyngeal --  Pharyngeal- Multi-consistency --  Pharyngeal --  Pharyngeal- Pill --  Pharyngeal --  Pharyngeal Comment --        02/22/2024   11:00 AM  Cervical Esophageal Phase   Cervical Esophageal Phase WFL  Pudding Teaspoon --  Pudding Cup --  Honey Teaspoon --  Honey Cup --  Nectar Teaspoon --  Nectar Cup --  Nectar Straw --  Thin Teaspoon --  Thin Cup --  Thin Straw --  Puree --  Mechanical Soft --  Regular --  Multi-consistency --  Pill --  Cervical Esophageal Comment --     Beth Brooke., M.A. CCC-SLP Acute Rehabilitation Services Office: [phone]  Secure chat preferred  02/22/2024, 11:35 AM

## 2024-02-22 NOTE — Progress Notes (Signed)
 Physical Therapy Treatment Patient Details Name: [name] MRN: [id number] DOB: 1941-07-23 Today's Date: 02/22/2024   History of Present Illness 83 y.o. female presents to [facility] 02/08/24 for complex MVR and MAZE (via sternotomy). Post-op complicated by persistent a fib RVR and volume overload. Cardioversion x2 4/18 without resolution of afib.  4/21 returned to [facility]. PMHx: anemia, HTN, mitral valve prolapse, moderate mitral regurgitation, tricuspid regurgitation    PT Comments  Patient up in chair. Stated her breathing feels "not as good" today and did not want to try to ambulate. Agreed to exercises including sit to stand. Focused on leaning forward with anterior wt-shift to increase independence. Patient participated in LE exercises and reported incr difficulty with her breathing with seated marching, although no change in RR or sats noted. Patient remained up in chair at end of session and encouraged to continue to work on ankle pumps and quad sets for edema management.     If plan is discharge home, recommend the following: A little help with walking and/or transfers;A little help with bathing/dressing/bathroom;Assistance with cooking/housework;Assist for transportation;Help with stairs or ramp for entrance   Can travel by private vehicle        Equipment Recommendations  None recommended by PT    Recommendations for Other Services       Precautions / Restrictions Precautions Precautions: Fall;Sternal Precaution Booklet Issued: Yes (comment) Recall of Precautions/Restrictions: Impaired Precaution/Restrictions Comments: pt reports she knows them but has difficulty "putting them into action" Restrictions Other Position/Activity Restrictions: sternal     Mobility  Bed Mobility               General bed mobility comments: up in recliner on arrival    Transfers Overall transfer level: Needs assistance Equipment used: None Transfers: Sit to/from Stand Sit to Stand: Min  assist           General transfer comment: 1st time with posterior lean and lifting assist; 2nd, 3rd time light guidance for anterior weight shfit  rather than lifting assist    Ambulation/Gait               General Gait Details: pt refused stating her breathing did not feel right; sats 94%, RR 18   Stairs             Wheelchair Mobility     Tilt Bed    Modified Rankin (Stroke Patients Only)       Balance Overall balance assessment: Needs assistance Sitting-balance support: No upper extremity supported, Feet supported Sitting balance-Leahy Scale: Fair     Standing balance support: No upper extremity supported Standing balance-Leahy Scale: Fair Standing balance comment: able to stand statically with no AD for up to 4 minutes, stood x 3 reps                            Communication Communication Communication: Impaired Factors Affecting Communication: Hearing impaired (has hearing aids)  Cognition Arousal: Alert Behavior During Therapy: WFL for tasks assessed/performed, Flat affect   PT - Cognitive impairments: No apparent impairments                         Following commands: Intact      Cueing Cueing Techniques: Verbal cues, Tactile cues  Exercises General Exercises - Lower Extremity Ankle Circles/Pumps: AROM, Both, 10 reps, Seated Quad Sets: AROM, Both, 10 reps Long Arc Quad: AROM, Both, Seated, 10 reps Hip  Flexion/Marching: AROM, Both, Seated, 10 reps (unsupported sitting)    General Comments        Pertinent Vitals/Pain Pain Assessment Pain Assessment: No/denies pain    Home Living                          Prior Function            PT Goals (current goals can now be found in the care plan section) Acute Rehab PT Goals Patient Stated Goal: to feel better Time For Goal Achievement: 02/24/24 Potential to Achieve Goals: Good Progress towards PT goals: Progressing toward goals    Frequency     Min 2X/week      PT Plan      Co-evaluation              AM-PAC PT "6 Clicks" Mobility   Outcome Measure  Help needed turning from your back to your side while in a flat bed without using bedrails?: A Little Help needed moving from lying on your back to sitting on the side of a flat bed without using bedrails?: A Little Help needed moving to and from a bed to a chair (including a wheelchair)?: A Little Help needed standing up from a chair using your arms (e.g., wheelchair or bedside chair)?: A Little Help needed to walk in hospital room?: A Little Help needed climbing 3-5 steps with a railing? : A Little 6 Click Score: 18    End of Session   Activity Tolerance: Patient limited by fatigue Patient left: in chair;with call bell/phone within reach Nurse Communication: Mobility status PT Visit Diagnosis: Unsteadiness on feet (R26.81);Muscle weakness (generalized) (M62.81);Other abnormalities of gait and mobility (R26.89)     Time: 1610-9604 PT Time Calculation (min) (ACUTE ONLY): 15 min  Charges:    $Therapeutic Exercise: 8-22 mins PT General Charges $$ ACUTE PT VISIT: 1 Visit                      Gayle Kava, PT Acute Rehabilitation Services  Office [phone]    Guilford Leep 02/22/2024, 9:20 AM

## 2024-02-22 NOTE — TOC Progression Note (Signed)
 Transition of Care [facility]) - Progression Note    Patient Details  Name: [name] MRN: [id number] Date of Birth: 04/22/1941  [facility] CM/SW Contact  Ernst Heap Phone Number: [phone] 02/22/2024, 2:56 PM  Clinical Narrative:   Will continue to follow for dc needs.   TOC will continue to follow.     Expected Discharge Plan: Home w Home Health Services Barriers to Discharge: Continued Medical Work up  Expected Discharge Plan and Services   Discharge Planning Services: CM Consult Post Acute Care Choice: Home Health Living arrangements for the past 2 months: Single Family Home                           HH Arranged: OT, PT HH Agency: [facility] Date HH Agency Contacted: 02/12/24 Time HH Agency Contacted: 1338 Representative spoke with at [facility] Agency: [facility]   Social Determinants of Health (SDOH) Interventions SDOH Screenings   Food Insecurity: No Food Insecurity (02/08/2024)  Housing: Low Risk  (02/08/2024)  Transportation Needs: No Transportation Needs (02/08/2024)  Utilities: Not At Risk (02/08/2024)  Depression (PHQ2-9): Low Risk  (12/20/2023)  Social Connections: Socially Integrated (02/09/2024)  Tobacco Use: Low Risk  (02/08/2024)    Readmission Risk Interventions     No data to display

## 2024-02-22 NOTE — Progress Notes (Signed)
 T[facility] DAILY ICU PROGRESS NOTE                   [address]            [place] [address]          [phone]   14 Days Post-Op Procedure(s) (LRB): MITRAL VALVE REPAIR USING A SIMULUS SEMI-RIGID ANNULOPLASTY BAND (N/A) MAZE PROCEDURE (N/A) CLIPPING, LEFT ATRIAL APPENDAGE USING A MEDTRONIC CLIP (N/A) ECHOCARDIOGRAM, TRANSESOPHAGEAL (N/A)  Total Length of Stay:  LOS: 14 days   Subjective: Patient states breathing is a little harder to do this am. She had a bowel movement yesterday.  Objective: Vital signs in last 24 hours: Temp:  [98.1 F (36.7 C)-98.4 F (36.9 C)] 98.4 F (36.9 C) (04/22 1700) Pulse Rate:  [70-88] 84 (04/23 0700) Cardiac Rhythm: Normal sinus rhythm (04/23 0400) Resp:  [11-33] 28 (04/23 0700) BP: (86-115)/(50-93) 113/56 (04/23 0700) SpO2:  [81 %-97 %] 93 % (04/23 0700) Weight:  [76.2 kg] 76.2 kg (04/23 0500)  Filed Weights   02/20/24 0600 02/21/24 0622 02/22/24 0500  Weight: 75.4 kg 75.3 kg 76.2 kg    Weight change: 0.9 kg   Hemodynamic parameters for last 24 hours: CVP:  [3 mmHg-11 mmHg] 8 mmHg  Intake/Output from previous day: 04/22 0701 - 04/23 0700 In: 402.7 [P.O.:240; I.V.:162.7] Out: 550 [Urine:550]  Intake/Output this shift: No intake/output data recorded.  Current Meds: Scheduled Meds:  amiodarone   400 mg Oral BID   Followed by   Cecily Cohen ON 02/23/2024] amiodarone   200 mg Oral BID   apixaban   5 mg Oral BID   Chlorhexidine  Gluconate Cloth  6 each Topical Daily   digoxin   0.0625 mg Oral Daily   ezetimibe   10 mg Oral Daily   feeding supplement  1 Container Oral TID BM   insulin  aspart  2-6 Units Subcutaneous Q4H   pantoprazole   40 mg Oral Daily   sodium chloride  flush  3-10 mL Intravenous Q12H   spironolactone   12.5 mg Oral Daily   Continuous Infusions:  promethazine  (PHENERGAN ) injection (IM or IVPB) Stopped (02/20/24 0000)   PRN Meds:.dextrose , loperamide , ondansetron  (ZOFRAN ) IV, mouth rinse,  oxyCODONE , phenol, polyethylene glycol, promethazine  (PHENERGAN ) injection (IM or IVPB), sodium chloride  flush, traMADol , witch hazel-glycerin   General appearance: alert, cooperative Neurologic: intact Heart: RRR Lungs: Tachypnea earlier, diminished breath sounds at bases l>R Abdomen: Soft, non tender, bowel sounds present Extremities: Unna boots bilaterally Wound: Sternal wound is clean, dry, healing without signs of infection  Lab Results: CBC: Recent Labs    02/20/24 0541  WBC 15.4*  HGB 8.5*  HCT 25.6*  PLT 316   BMET:  Recent Labs    02/21/24 1605 02/22/24 0524  NA 124* 123*  K 3.5 3.3*  CL 83* 85*  CO2 27 27  GLUCOSE 121* 97  BUN 20 19  CREATININE 1.48* 1.44*  CALCIUM  7.5* 7.3*    CMET: Lab Results  Component Value Date   WBC 15.4 (H) 02/20/2024   HGB 8.5 (L) 02/20/2024   HCT 25.6 (L) 02/20/2024   PLT 316 02/20/2024   GLUCOSE 97 02/22/2024   CHOL 104 04/29/2021   TRIG 157 (H) 04/29/2021   HDL 38 (L) 04/29/2021   LDLCALC 39 04/29/2021   ALT 15 02/07/2024   AST 22 02/07/2024   NA 123 (L) 02/22/2024   K 3.3 (L) 02/22/2024   CL 85 (L) 02/22/2024   CREATININE 1.44 (H) 02/22/2024   BUN 19 02/22/2024  CO2 27 02/22/2024   TSH 3.482 12/30/2023   INR 1.6 (H) 02/08/2024   HGBA1C 4.7 (L) 02/07/2024      PT/INR: No results for input(s): "LABPROT", "INR" in the last 72 hours. Radiology: No results found.   Assessment/Plan: S/P Procedure(s) (LRB): MITRAL VALVE REPAIR USING A SIMULUS SEMI-RIGID ANNULOPLASTY BAND (N/A) MAZE PROCEDURE (N/A) CLIPPING, LEFT ATRIAL APPENDAGE USING A MEDTRONIC CLIP (N/A) ECHOCARDIOGRAM, TRANSESOPHAGEAL (N/A) CV-A fib with RVR. S/p DCCV 04/18 (failed). SR this am. Milrinone  stopped yesterday. Co ox this am 49.7. On Amiodarone  400 mg bid,  Digoxin  0.0625 daily, and Apixaban  5 mg bid  Pulmonary-on room air. CXR this am appears to show bilateral pleural effusions/atelectasis left base. Encourage incentive  spirometer. Acute systolic heart failure, non ischemic cardiomyopathy-on Spironolactone  12.5 mg daily and Unna boots. CVP 12 this am. AHF following. Expected post op blood loss anemia-Last H and H 8.5 and 25.6 CBGs 133/102/100. Pre op HGA1C 4.7. Will stop accu checks and SS PRN upon transfer to the floor 6. Hyponatremia-sodium this am 123 (has been 123-124 mostly of late).  7. Creatinine this am 1.44. Creatinine prior to admission 1.15. 8. Supplement potassium 9. Deconditioned-continue PT  Allegra Arch PA-C 02/22/2024 7:10 AM

## 2024-02-22 NOTE — Progress Notes (Addendum)
 NAME:  [name], MRN:  [id number], DOB:  1941/06/27, LOS: 14 ADMISSION DATE:  02/08/2024, CONSULTATION DATE:/07/2024 REFERRING MD: Melene Sportsman, CHIEF COMPLAINT:/07/2024  History of Present Illness:  83 year old female with hypertension, paroxysmal A-fib who initially presented with increasing shortness of breath and bilateral leg swelling, noted to have severe mitral valve regurgitation and moderate tricuspid valve regurgitation.  Her ejection fraction was 60%, she underwent evaluation with cardiac cath which showed no coronary artery disease, today she underwent mitral valve repair with maze procedure.  Patient remained intubated and was transferred to ICU postop.  PCCM was consulted for help evaluation medical management  Pertinent  Medical History   Past Medical History:  Diagnosis Date   Acid reflux    Anemia    Arthritis    Atrial fibrillation (HCC)    Cataract    Diverticulosis of colon    Endometrial cancer (HCC)    1A grade endometrial ca   Essential hypertension 01/24/2009   Qualifier: Diagnosis of  By: Nelson-Smith CMA (AAMA), Dottie     Hx of radiation therapy 04/24/08-05/29/08& 8/12/,8/19,&06/27/08   external beam  through 7/09 ,then intracavity in 06/2008   Hypertension    Mitral valve prolapse    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   Tricuspid regurgitation      Significant Hospital Events: Including procedures, antibiotic start and stop dates in addition to other pertinent events   4/9 Mitral valve repair s/p Maze procedure, extubated 4/10 Afib RVR on amio gtt, requiring pressors  4/11 A-fib controlled with Amio drip, weaning off pressors, still having some nausea secondary to vasoplegia but improving, up to chair, plan to walk later-overall progressing 4/14 changing amio to PO after bolus, changing to PO eliquis , introducer wires and foley to dc  4/16 slp for concern of dysphagia, picc placed, coox low, milrinone / lasix  gtt 4/18 failed DCCV, dig added 4/21  chemically converted to sinus.  4/22 milrinone  stopped 4/23 Co. oximetry mid 50s hemodynamically stable BNP trending in the mid 500s getting extra Lasix   Interim History / Subjective:   No distress but does have some shortness of breath and increased chest tightness Objective   Blood pressure (!) 113/56, pulse 84, temperature 98 F (36.7 C), temperature source Oral, resp. rate (!) 28, height 5\' 4"  (1.626 m), weight 76.2 kg, SpO2 93%. CVP:  [3 mmHg-11 mmHg] 8 mmHg      Intake/Output Summary (Last 24 hours) at 02/22/2024 1027 Last data filed at 02/21/2024 2000 Gross per 24 hour  Intake 367.97 ml  Output 350 ml  Net 17.97 ml   Filed Weights   02/20/24 0600 02/21/24 0622 02/22/24 0500  Weight: 75.4 kg 75.3 kg 76.2 kg   Examination: General pleasant 83 year old female sitting up in bed no acute distress currently HEENT normocephalic atraumatic no jugular venous distention has an old IJ dressing intact Pulmonary: Diminished both bases no accessory use PCXR w/ mild inc in pulm edema and element of left effusion Cardiac: Regular rate and rhythm Abdomen soft nontender Extremities are warm with dependent edema she has bilateral Unna boots Neuro intact  Resolved Hospital Problem list   AcuPost-op vasoplegia te respiratory insufficiency, postop-extubated on 4/9  Urinary retention  Assessment & Plan:   H/o Afib and Severe MV s/p MVR and MAZE 4/9, complicated by Acute HFrEF  - course c/b afib with RVR, hypervolemia, acute HFrEF (suspected 2/2 afib w/RVR) Co-ox sig worse off milrinone  (stopped 4/22 had been in mid 60s-72, now down 49->56 Plan Cont tele Cont DOAC  Continue spironolactone , got additional diuretics this morning dig per cards, check lactate and CBC  DCCV if needed  Keep K > 4 and Mg > 2 multimodal analgesia w/ bowel regimen  ongoing IS, pulm hygiene, mobilize/ PT  Dyspnea w/ pulmonary edema and left effusion  Procalcitonin was reassuring at less than 0.1, BNP  569 plan Agree with additional dose of Lasix , as well as Diamox  Continue pulse oximetry Incentive spirometry Mobilization Reassess following response to diuretic  HLD Plan Continue Zetia  and Lipitor  AKI Scr Better Plan  Hypervolemic hyponatremia and Hypokalemia Plan Replace K  Treat Heart failure  Diuretics per heart failure  Leukocytosis - remains afebrile, WBC stable, suspect reactive, no clear source - completed zosyn  x 5d 4/16,  BCX neg,  Plan Monitor off abx   Dysphagia - SLP eval yesterday, no changes. Did not feel there was an aspiration risk.  Plan Still considering possible FEEs   Best Practice (right click and "Reselect all SmartList Selections" daily)   Diet/type: per SLP DVT prophylaxis: eliquis   GI prophylaxis: PPI Lines: PICC  Foley: d/c Code Status:  full code Last date of multidisciplinary goals of care discussion [per primary  ] My time 38 minutes  02/22/2024 8:06 AM

## 2024-02-23 DIAGNOSIS — N179 Acute kidney failure, unspecified: Secondary | ICD-10-CM | POA: Diagnosis not present

## 2024-02-23 DIAGNOSIS — E876 Hypokalemia: Secondary | ICD-10-CM | POA: Diagnosis not present

## 2024-02-23 DIAGNOSIS — I480 Paroxysmal atrial fibrillation: Secondary | ICD-10-CM | POA: Diagnosis not present

## 2024-02-23 DIAGNOSIS — I5021 Acute systolic (congestive) heart failure: Secondary | ICD-10-CM | POA: Diagnosis not present

## 2024-02-23 LAB — BASIC METABOLIC PANEL WITH GFR
Anion gap: 11 (ref 5–15)
BUN: 20 mg/dL (ref 8–23)
CO2: 26 mmol/L (ref 22–32)
Calcium: 8 mg/dL — ABNORMAL LOW (ref 8.9–10.3)
Chloride: 87 mmol/L — ABNORMAL LOW (ref 98–111)
Creatinine, Ser: 1.28 mg/dL — ABNORMAL HIGH (ref 0.44–1.00)
GFR, Estimated: 42 mL/min — ABNORMAL LOW (ref >60)
Glucose, Bld: 106 mg/dL — ABNORMAL HIGH (ref 70–99)
Potassium: 3.7 mmol/L (ref 3.5–5.1)
Sodium: 124 mmol/L — ABNORMAL LOW (ref 135–145)

## 2024-02-23 LAB — MAGNESIUM: Magnesium: 2 mg/dL (ref 1.7–2.4)

## 2024-02-23 LAB — COOXEMETRY PANEL
Carboxyhemoglobin: 2.3 % — ABNORMAL HIGH (ref 0.5–1.5)
Methemoglobin: <0.7 % (ref 0.0–1.5)
O2 Saturation: 69.3 %
Total hemoglobin: 9 g/dL — ABNORMAL LOW (ref 12.0–16.0)

## 2024-02-23 LAB — PROCALCITONIN: Procalcitonin: <0.1 ng/mL

## 2024-02-23 LAB — BRAIN NATRIURETIC PEPTIDE: B Natriuretic Peptide: 753.5 pg/mL — ABNORMAL HIGH (ref 0.0–100.0)

## 2024-02-23 LAB — DIGOXIN LEVEL: Digoxin Level: 0.5 ng/mL — ABNORMAL LOW (ref 0.8–2.0)

## 2024-02-23 MED ORDER — AMIODARONE HCL 200 MG PO TABS
400.0000 mg | ORAL_TABLET | Freq: Two times a day (BID) | ORAL | Status: AC
  Administered 2024-02-23 – 2024-03-06 (×25): 400 mg via ORAL
  Filled 2024-02-23 (×25): qty 2

## 2024-02-23 MED ORDER — TORSEMIDE 20 MG PO TABS
40.0000 mg | ORAL_TABLET | Freq: Two times a day (BID) | ORAL | Status: DC
  Administered 2024-02-23 – 2024-02-24 (×4): 40 mg via ORAL
  Filled 2024-02-23 (×4): qty 2

## 2024-02-23 MED ORDER — POTASSIUM CHLORIDE CRYS ER 20 MEQ PO TBCR
30.0000 meq | EXTENDED_RELEASE_TABLET | Freq: Once | ORAL | Status: AC
  Administered 2024-02-23: 30 meq via ORAL
  Filled 2024-02-23: qty 1

## 2024-02-23 MED ORDER — AMIODARONE IV BOLUS ONLY 150 MG/100ML
150.0000 mg | Freq: Once | INTRAVENOUS | Status: AC
  Administered 2024-02-23: 150 mg via INTRAVENOUS
  Filled 2024-02-23: qty 100

## 2024-02-23 NOTE — Care Management Important Message (Signed)
 Important Message  Patient Details  Name: [name] MRN: [id number] Date of Birth: [birth year]   Important Message Given:  Yes - Medicare IM     Janith Melnick 02/23/2024, 10:50 AM

## 2024-02-23 NOTE — Progress Notes (Signed)
 Physical Therapy Treatment Patient Details Name: [name] MRN: [id number] DOB: [birth year] Today's Date: 02/23/2024   History of Present Illness 83 y.o. female presents to [facility] 02/08/24 for complex MVR and MAZE (via sternotomy). Post-op complicated by persistent a fib RVR and volume overload. Cardioversion x2 4/18 without resolution of afib.  4/21 returned to [facility]. 4/9-4/23 ICU stay. Plan for repeat DCCV 4/25. PMHx: anemia, HTN, mitral valve prolapse, moderate mitral regurgitation, tricuspid regurgitation    PT Comments  Pt in recliner upon arrival with husband present and agreeable to PT session. Pt continues to be limited by fatigue and decreased activity tolerance. Focused today's session on sit/stand transfers and practicing bed mobility. Pt improved by needing CGA for rolling and sidelying to sit transition. She continues to require ModA to bring B LE's onto to the bed for sit/sidelying transition. After ambulating a short distance and practicing bed mobility, pt required ModA to stand. Pt's husband is only able to provide light physical assist. At this point, pt and husband feel comfortable discharging home. Discussed that if pt continues to need physical assist, it may be a good idea to look into rehab prior to d/c home with pt and husband agreement. Pt is progressing towards goals. Acute PT to follow.   106 BPM at rest 106-122 BPM during activity    If plan is discharge home, recommend the following: A little help with walking and/or transfers;A little help with bathing/dressing/bathroom;Assistance with cooking/housework;Assist for transportation;Help with stairs or ramp for entrance   Can travel by private vehicle      Yes  Equipment Recommendations  None recommended by PT       Precautions / Restrictions Precautions Precautions: Fall;Sternal Precaution/Restrictions Comments: Watch HR, needs cues for sternal precautions throughout session Restrictions Other Position/Activity  Restrictions: sternal     Mobility  Bed Mobility Overal bed mobility: Needs Assistance Bed Mobility: Rolling, Sit to Sidelying, Sidelying to Sit Rolling: Contact guard assist Sidelying to sit: Contact guard assist     Sit to sidelying: Mod assist General bed mobility comments: practiced bed mobility with multiple reps, CGA to roll and move from sidelying to sit. ModA to return to supine for B LE management.    Transfers Overall transfer level: Needs assistance Equipment used: None Transfers: Sit to/from Stand Sit to Stand: Contact guard assist, Min assist, Mod assist    General transfer comment: Pt initially required MinA for boost-up from recliner. Progressed to CGA from [facility]. After practicing bed mobility, pt required ModA to stand for boost-up and steadying assist.    Ambulation/Gait Ambulation/Gait assistance: Contact guard assist Gait Distance (Feet): 18 Feet Assistive device: Rolling walker (2 wheels) Gait Pattern/deviations: Step-through pattern, Decreased stride length Gait velocity: decr    General Gait Details: slow and steady steps. Pt limiting distance by choosing to go to [facility] instead of ambulating to the bathroom due to fatigue     Balance Overall balance assessment: Needs assistance, Mild deficits observed, not formally tested Sitting-balance support: No upper extremity supported, Feet supported Sitting balance-Leahy Scale: Fair     Standing balance support: No upper extremity supported Standing balance-Leahy Scale: Fair Standing balance comment: able to stand statically, RW for gait     Communication Communication Communication: Impaired (has hearing aids) Factors Affecting Communication: Hearing impaired  Cognition Arousal: Alert Behavior During Therapy: WFL for tasks assessed/performed, Flat affect   PT - Cognitive impairments: No apparent impairments    Following commands: Intact      Cueing Cueing Techniques: Verbal  cues, Tactile cues   Exercises General Exercises - Lower Extremity Ankle Circles/Pumps: AROM, Both, 10 reps, Seated Long Arc Quad: AROM, Both, Seated, 10 reps Hip Flexion/Marching: AROM, Both, Seated, 10 reps        Pertinent Vitals/Pain Pain Assessment Pain Assessment: No/denies pain     PT Goals (current goals can now be found in the care plan section) Acute Rehab PT Goals PT Goal Formulation: With patient/family Time For Goal Achievement: 03/08/24 Potential to Achieve Goals: Good Progress towards PT goals: Progressing toward goals    Frequency    Min 2X/week       AM-PAC PT "6 Clicks" Mobility   Outcome Measure  Help needed turning from your back to your side while in a flat bed without using bedrails?: A Little Help needed moving from lying on your back to sitting on the side of a flat bed without using bedrails?: A Little Help needed moving to and from a bed to a chair (including a wheelchair)?: A Lot Help needed standing up from a chair using your arms (e.g., wheelchair or bedside chair)?: A Lot Help needed to walk in hospital room?: A Little Help needed climbing 3-5 steps with a railing? : A Little 6 Click Score: 16    End of Session Equipment Utilized During Treatment: Gait belt Activity Tolerance: Patient limited by fatigue Patient left: in bed;with call bell/phone within reach;with family/visitor present Nurse Communication: Mobility status PT Visit Diagnosis: Unsteadiness on feet (R26.81);Muscle weakness (generalized) (M62.81);Other abnormalities of gait and mobility (R26.89)     Time: 6045-4098 PT Time Calculation (min) (ACUTE ONLY): 27 min  Charges:    $Gait Training: 8-22 mins $Therapeutic Exercise: 8-22 mins PT General Charges $$ ACUTE PT VISIT: 1 Visit                     [name], PT, DPT Secure Chat Preferred  Rehab Office [phone]    [name] 02/23/2024, 12:20 PM

## 2024-02-23 NOTE — Progress Notes (Signed)
 Orthopedic Tech Progress Note Patient Details:  [name] 24-Oct-1941 [id number] Applied unna boots per order.  Ortho Devices Type of Ortho Device: Ace wrap, Unna boot Ortho Device/Splint Location: BLE Ortho Device/Splint Interventions: Ordered, Application, Adjustment   Post Interventions Patient Tolerated: Well Instructions Provided: Adjustment of device, Care of device  [name] 02/23/2024, 5:37 PM

## 2024-02-23 NOTE — Progress Notes (Signed)
 CARDIAC REHAB PHASE I    Pt sitting in chair, feeling well today. Mobility continues to be limited due to A-fib RVR and fatigue. Encouraged increased mobility as tolerated, IS use and small frequent snacks. Will continue to follow.   [id number] Ronny Colas, RN BSN 02/23/2024 10:19 AM

## 2024-02-23 NOTE — Progress Notes (Signed)
 Chest tube stitches removed as ordered, steri strips applied. Bedside RN aware. Avy Barlett Jessup RN

## 2024-02-23 NOTE — Progress Notes (Signed)
      [address]       [place] [address]             [phone]        15 Days Post-Op Procedure(s) (LRB): MITRAL VALVE REPAIR USING A SIMULUS SEMI-RIGID ANNULOPLASTY BAND (N/A) MAZE PROCEDURE (N/A) CLIPPING, LEFT ATRIAL APPENDAGE USING A MEDTRONIC CLIP (N/A) ECHOCARDIOGRAM, TRANSESOPHAGEAL (N/A)  Subjective: Patient states breathing is no worse than yesterday, but still not that good. "I am peeing a lot".  Objective: Vital signs in last 24 hours: Temp:  [97.8 F (36.6 C)-98.1 F (36.7 C)] 97.8 F (36.6 C) (04/24 0336) Pulse Rate:  [79-114] 99 (04/24 0420) Cardiac Rhythm: Atrial fibrillation (04/23 1920) Resp:  [20-27] 20 (04/24 0336) BP: (90-117)/(55-101) 90/61 (04/24 0420) SpO2:  [84 %-98 %] 96 % (04/24 0336) Weight:  [74.9 kg] 74.9 kg (04/24 0336)  Pre op weight 68.5 kg Current Weight  02/23/24 74.9 kg    Hemodynamic parameters for last 24 hours: CVP:  [8 mmHg-13 mmHg] 12 mmHg  Intake/Output from previous day: 04/23 0701 - 04/24 0700 In: 490 [P.O.:480; I.V.:10] Out: -    Physical Exam:  Cardiovascular: [facility] Pulmonary: Diminished bibasilar breath sounds Abdomen: Soft, non tender, bowel sounds present. Extremities: Unna boots bilaterally Wound: Clean and dry.  No erythema or signs of infection.  Lab Results: CBC: Recent Labs    02/22/24 0838  WBC 16.9*  HGB 8.8*  HCT 27.1*  PLT 336   BMET:  Recent Labs    02/22/24 1300 02/23/24 0430  NA 124* 124*  K 3.2* 3.7  CL 86* 87*  CO2 27 26  GLUCOSE 122* 106*  BUN 20 20  CREATININE 1.37* 1.28*  CALCIUM  7.7* 8.0*    PT/INR:  Lab Results  Component Value Date   INR 1.6 (H) 02/08/2024   INR 1.0 02/07/2024   ABG:  INR: Will add last result for INR, ABG once components are confirmed Will add last 4 CBG results once components are confirmed  Assessment/Plan: CV-A fib with RVR. S/p DCCV 04/18 (failed).  A fib this am with HR in the 110's.. Co ox this am  69.3. Milrinone  stopped 04/22. On Amiodarone  400 mg bid,  Digoxin  0.0625 daily, and Apixaban  5 mg bid. Will give IV Amiodarone  bolus  Pulmonary-On room air. Encourage incentive spirometer. Acute systolic heart failure, non ischemic cardiomyopathy-on Spironolactone  12.5 mg daily and Unna boots. CVP 4 this am. BNP increased to 753.5 this am. AHF following;await this am's recommendations. Expected post op blood loss anemia-Last H and H 8.5 and 25.6 5. Hyponatremia-sodium this am 124 (has been 123-124 mostly of late).  6. Creatinine this am slightly decreased to 1.28. Creatinine prior to admission 1.15. 7. Supplement potassium 8. Deconditioned-continue PT   Corazon Nickolas M ZimmermanPA-C 7:02 AM

## 2024-02-23 NOTE — Progress Notes (Addendum)
 Advanced Heart Failure Rounding Note  Cardiologist: [name], MD   Chief Complaint: Acute systolic heart failure  Subjective:    - S/p MVR 02/08/24. Post-op complicated by persistent a fib RVR and volume overload. - Failed DCCV on 4/18 - Back in SR 04/21. Diuretics stopped d/t hypochloremia, hypokalemia and hyponatremia. Given hypertonic saline. - Milrinone  stopped 04/22  CO-OX 69%, stable off milrinone . CVP 11  She went back into a fib yesterday.   Feels ok. Denies CP/SOB.   Objective:    Weight Range: 74.9 kg Body mass index is 28.34 kg/m.   Vital Signs:   Temp:  [97.7 F (36.5 C)-98.1 F (36.7 C)] 97.7 F (36.5 C) (04/24 0841) Pulse Rate:  [79-114] 99 (04/24 0841) Resp:  [16-27] 23 (04/24 0841) BP: (90-117)/(55-101) 109/67 (04/24 0841) SpO2:  [84 %-98 %] 95 % (04/24 0420) Weight:  [74.9 kg] 74.9 kg (04/24 0336) Last BM Date : 02/22/24  Weight change: Filed Weights   02/21/24 0622 02/22/24 0500 02/23/24 0336  Weight: 75.3 kg 76.2 kg 74.9 kg   Intake/Output:  Intake/Output Summary (Last 24 hours) at 02/23/2024 0912 Last data filed at 02/23/2024 0907 Gross per 24 hour  Intake 850 ml  Output 20 ml  Net 830 ml    Physical Exam   CVP 11 General:  elderly appearing.  No respiratory difficulty Neck: supple. JVD ~10 cm.  Cor: PMI nondisplaced. Irregular rate & rhythm. No rubs, gallops . + systolic murmur. Lungs: clear Extremities: no cyanosis, clubbing, rash, trace BLE edema. +UNNA boots. PICC RUE Neuro: alert & oriented x 3. Moves all 4 extremities w/o difficulty. Affect pleasant.   Telemetry   A fib 110s (Personally reviewed)    Labs    CBC Recent Labs    02/22/24 0838  WBC 16.9*  HGB 8.8*  HCT 27.1*  MCV 90.3  PLT 336   Basic Metabolic Panel Recent Labs    52/84/13 0524 02/22/24 1300 02/23/24 0430  NA 123* 124* 124*  K 3.3* 3.2* 3.7  CL 85* 86* 87*  CO2 27 27 26   GLUCOSE 97 122* 106*  BUN 19 20 20   CREATININE 1.44* 1.37* 1.28*   CALCIUM  7.3* 7.7* 8.0*  MG 2.1  --  2.0   Imaging   No results found.   Medications:    Scheduled Medications:  apixaban   5 mg Oral BID   atorvastatin   10 mg Oral Daily   Chlorhexidine  Gluconate Cloth  6 each Topical Daily   [facility], Patient & Family Education   Does not apply Once   digoxin   0.0625 mg Oral Daily   ezetimibe   10 mg Oral Daily   feeding supplement  237 mL Oral BID BM   multivitamin with minerals  1 tablet Oral Daily   pantoprazole   40 mg Oral Daily   sodium chloride  flush  3-10 mL Intravenous Q12H   spironolactone   25 mg Oral Daily   torsemide   40 mg Oral BID   Infusions:  promethazine  (PHENERGAN ) injection (IM or IVPB) Stopped (02/20/24 0000)   PRN Medications: loperamide , ondansetron  (ZOFRAN ) IV, mouth rinse, oxyCODONE , phenol, polyethylene glycol, promethazine  (PHENERGAN ) injection (IM or IVPB), sodium chloride  flush, traMADol , witch hazel-glycerin   Patient Profile   [name] is a 83 y.o. female with HTN and PAF. Admitted with SOB> found to have severe MR. Now s/p MVR, MAZE and LAA clipping. AHF team consulted for acute systolic heart failure.   Assessment/Plan  Acute systolic heart failure,  NiCM - Intra-op TEE 02/08/24 with EF 55-60% and severe MR - [facility] 2/25 with no CAD. RA 8, PA 41/17 (14), Fick CO/CI 7.76/4.41, PAPi 3, LVEDP 11 - Echo 4/25 with EF 35-40%, LV with GHK, RV normal, LA mod dilated, RA severely dilated, trivial MR, mod TR - Suspect drop in EF 2/2 persistent AF RVR.  - NYHA II-IIIa, IV on admission - CO-OX 69% off milrinone .  - Volume mildly elevated. CVP 11. BNP trending up. Start Torsemide  40 mg BID - Dig reduced to 0.0625 mg daily given recent elevated level. Dig level 0.5 this morning. - Continue spiro 25 mg daily - Continue UNNA boots   Atrial fibrillation with RVR - S/p Maze and LAA clipping 4/25 - Failed DCCV X 2 04/17 - Back in SR 04/21. Unfortunately back in a fib yesterday. Continue po amiodarone   400 BID. Give bolus this am. Will follow up for a repeat later this today.  - Plan for repeat DCCV tomorrow  - Continue eliquis  5 mg BID.  Informed Consent   Shared Decision Making/Informed Consent The risks (stroke, cardiac arrhythmias rarely resulting in the need for a temporary or permanent pacemaker, skin irritation or burns and complications associated with conscious sedation including aspiration, arrhythmia, respiratory failure and death), benefits (restoration of normal sinus rhythm) and alternatives of a direct current cardioversion were explained in detail to [name] and she agrees to proceed.        Severe MR s/p MVR - Intra-op TEE with severe MR 02/08/24 - Trivial MR on echo 02/14/24  4. AKI - baseline Cr normal - Cr stable ~ 1.28 today - Watch with diuresis  7. Hypokalemia - Supp aggressively with diuresis  8. Hyponatremia - Na 124 - Received hypertonic saline 04/21 - Continue to follow  8. Debility - Continue aggressive PT/OT   Length of Stay: 15   Sheryl Donna, NP  02/23/2024, 9:12 AM  Patient seen and examined with the above-signed Advanced Practice Provider and/or Housestaff. I personally reviewed laboratory data, imaging studies and relevant notes. I independently examined the patient and formulated the important aspects of the plan. I have edited the note to reflect any of my changes or salient points. I have personally discussed the plan with the patient and/or family.  Back in AF with RVR on po amio. Feels ok. CVP 11. Co-ox ok off milrinone  .  Denies CP or SOB  General: Sitting in chair. No resp difficulty HEENT: normal Neck: supple. JVP 10-11 Carotids 2+ bilat; no bruits. No lymphadenopathy or thryomegaly appreciated. Cor: Sternal wound ok. Irreg tachy  Lungs: clear Abdomen: soft, nontender, nondistended. No hepatosplenomegaly. No bruits or masses. Good bowel sounds. Extremities: no cyanosis, clubbing, rash, tr edema Neuro: alert & orientedx3,  cranial nerves grossly intact. moves all 4 extremities w/o difficulty. Affect pleasant  Volume status much better from previous. Back in AF.   Plan 1. Start torsemide  40 bid 2. Give IV amio boluses today 3. Repeat DC-CV in am   Jules Oar, MD  10:40 AM

## 2024-02-24 ENCOUNTER — Encounter (HOSPITAL_COMMUNITY)
Admission: RE | Disposition: A | Discharge status: Skilled Nursing Facility | Payer: Self-pay | Source: Home / Self Care | Attending: Thoracic Surgery (Cardiothoracic Vascular Surgery)

## 2024-02-24 DIAGNOSIS — N179 Acute kidney failure, unspecified: Secondary | ICD-10-CM | POA: Diagnosis not present

## 2024-02-24 DIAGNOSIS — E876 Hypokalemia: Secondary | ICD-10-CM | POA: Diagnosis not present

## 2024-02-24 DIAGNOSIS — I480 Paroxysmal atrial fibrillation: Secondary | ICD-10-CM | POA: Diagnosis not present

## 2024-02-24 DIAGNOSIS — I5021 Acute systolic (congestive) heart failure: Secondary | ICD-10-CM | POA: Diagnosis not present

## 2024-02-24 LAB — COOXEMETRY PANEL
Carboxyhemoglobin: 2.5 % — ABNORMAL HIGH (ref 0.5–1.5)
Methemoglobin: 0.8 % (ref 0.0–1.5)
O2 Saturation: 73.9 %
Total hemoglobin: 8.8 g/dL — ABNORMAL LOW (ref 12.0–16.0)

## 2024-02-24 LAB — BASIC METABOLIC PANEL WITH GFR
Anion gap: 11 (ref 5–15)
BUN: 20 mg/dL (ref 8–23)
CO2: 26 mmol/L (ref 22–32)
Calcium: 7.9 mg/dL — ABNORMAL LOW (ref 8.9–10.3)
Chloride: 88 mmol/L — ABNORMAL LOW (ref 98–111)
Creatinine, Ser: 1.33 mg/dL — ABNORMAL HIGH (ref 0.44–1.00)
GFR, Estimated: 40 mL/min — ABNORMAL LOW (ref >60)
Glucose, Bld: 96 mg/dL (ref 70–99)
Potassium: 3.4 mmol/L — ABNORMAL LOW (ref 3.5–5.1)
Sodium: 125 mmol/L — ABNORMAL LOW (ref 135–145)

## 2024-02-24 LAB — CBC
HCT: 25.4 % — ABNORMAL LOW (ref 36.0–46.0)
Hemoglobin: 8.3 g/dL — ABNORMAL LOW (ref 12.0–15.0)
MCH: 29 pg (ref 26.0–34.0)
MCHC: 32.7 g/dL (ref 30.0–36.0)
MCV: 88.8 fL (ref 80.0–100.0)
Platelets: 284 10*3/uL (ref 150–400)
RBC: 2.86 MIL/uL — ABNORMAL LOW (ref 3.87–5.11)
RDW: 18.1 % — ABNORMAL HIGH (ref 11.5–15.5)
WBC: 12.9 10*3/uL — ABNORMAL HIGH (ref 4.0–10.5)
nRBC: 0 % (ref 0.0–0.2)

## 2024-02-24 LAB — BRAIN NATRIURETIC PEPTIDE: B Natriuretic Peptide: 516.8 pg/mL — ABNORMAL HIGH (ref 0.0–100.0)

## 2024-02-24 LAB — MAGNESIUM: Magnesium: 1.8 mg/dL (ref 1.7–2.4)

## 2024-02-24 LAB — PROCALCITONIN: Procalcitonin: <0.1 ng/mL

## 2024-02-24 SURGERY — CARDIOVERSION (CATH LAB)
Anesthesia: General

## 2024-02-24 MED ORDER — LOSARTAN POTASSIUM 25 MG PO TABS
12.5000 mg | ORAL_TABLET | Freq: Every day | ORAL | Status: DC
  Administered 2024-02-24 – 2024-02-27 (×4): 12.5 mg via ORAL
  Filled 2024-02-24 (×4): qty 1

## 2024-02-24 MED ORDER — SODIUM CHLORIDE 0.9 % IV SOLN: INTRAVENOUS | Status: AC

## 2024-02-24 MED ORDER — POTASSIUM CHLORIDE CRYS ER 20 MEQ PO TBCR: 40.0000 meq | EXTENDED_RELEASE_TABLET | Freq: Every day | ORAL | Status: DC

## 2024-02-24 MED ORDER — POTASSIUM CHLORIDE CRYS ER 20 MEQ PO TBCR
30.0000 meq | EXTENDED_RELEASE_TABLET | Freq: Three times a day (TID) | ORAL | Status: AC
  Administered 2024-02-24 (×3): 30 meq via ORAL
  Filled 2024-02-24 (×3): qty 1

## 2024-02-24 NOTE — Progress Notes (Signed)
 Patient is in S.R.rate 82

## 2024-02-24 NOTE — Progress Notes (Addendum)
 Advanced Heart Failure Rounding Note  Cardiologist: [name], MD   Chief Complaint: Acute systolic heart failure  Subjective:    - S/p MVR 02/08/24. Post-op complicated by persistent a fib RVR and volume overload. - Failed DCCV on 4/18 - Back in SR 04/21. Diuretics stopped d/t hypochloremia, hypokalemia and hyponatremia. Given hypertonic saline. - Milrinone  stopped 04/22  CO-OX 74%, stable off milrinone . CVP 4  Back in NSR this morning around 2 am.   Feels better this morning. Has been working with PT. Has been using her incentive spirometer.   Objective:    Weight Range: 74.4 kg Body mass index is 28.15 kg/m.   Vital Signs:   Temp:  [97.7 F (36.5 C)-98.2 F (36.8 C)] 97.8 F (36.6 C) (04/25 0812) Pulse Rate:  [81-113] 86 (04/25 0812) Resp:  [12-23] 19 (04/25 0812) BP: (91-111)/(48-60) 111/60 (04/25 0812) SpO2:  [94 %-100 %] 98 % (04/25 0812) Weight:  [74.4 kg] 74.4 kg (04/25 0543) Last BM Date : 02/22/24  Weight change: Filed Weights   02/22/24 0500 02/23/24 0336 02/24/24 0543  Weight: 76.2 kg 74.9 kg 74.4 kg   Intake/Output:  Intake/Output Summary (Last 24 hours) at 02/24/2024 0845 Last data filed at 02/24/2024 0800 Gross per 24 hour  Intake 569.13 ml  Output 170 ml  Net 399.13 ml    Physical Exam   CVP 4 General:  elderly appearing.  No respiratory difficulty Neck: supple. JVD ~6 cm.  Cor: PMI nondisplaced. Regular rate & rhythm. No rubs, gallops or murmurs. Lungs: diminished Extremities: no cyanosis, clubbing, rash, trace BLE edema. +UNNA boots. RUE PICC Neuro: alert & oriented x 3. Moves all 4 extremities w/o difficulty. Affect pleasant.   Telemetry   NSR 70s (Personally reviewed)    Labs    CBC Recent Labs    02/22/24 0838 02/24/24 0425  WBC 16.9* 12.9*  HGB 8.8* 8.3*  HCT 27.1* 25.4*  MCV 90.3 88.8  PLT 336 284   Basic Metabolic Panel Recent Labs    16/10/96 0430 02/24/24 0425  NA 124* 125*  K 3.7 3.4*  CL 87* 88*  CO2 26  26  GLUCOSE 106* 96  BUN 20 20  CREATININE 1.28* 1.33*  CALCIUM  8.0* 7.9*  MG 2.0 1.8   Imaging   No results found.   Medications:    Scheduled Medications:  amiodarone   400 mg Oral BID   apixaban   5 mg Oral BID   atorvastatin   10 mg Oral Daily   Chlorhexidine  Gluconate Cloth  6 each Topical Daily   [facility], Patient & Family Education   Does not apply Once   digoxin   0.0625 mg Oral Daily   ezetimibe   10 mg Oral Daily   feeding supplement  237 mL Oral BID BM   multivitamin with minerals  1 tablet Oral Daily   pantoprazole   40 mg Oral Daily   potassium chloride   30 mEq Oral TID   sodium chloride  flush  3-10 mL Intravenous Q12H   spironolactone   25 mg Oral Daily   torsemide   40 mg Oral BID   Infusions:  sodium chloride      promethazine  (PHENERGAN ) injection (IM or IVPB) Stopped (02/20/24 0000)   PRN Medications: loperamide , ondansetron  (ZOFRAN ) IV, mouth rinse, oxyCODONE , phenol, polyethylene glycol, promethazine  (PHENERGAN ) injection (IM or IVPB), sodium chloride  flush, traMADol , witch hazel-glycerin   Patient Profile   NAJIA HURLBUTT is a 83 y.o. female with HTN and PAF. Admitted with SOB> found to  have severe MR. Now s/p MVR, MAZE and LAA clipping. AHF team consulted for acute systolic heart failure.   Assessment/Plan  Acute systolic heart failure, NiCM - Intra-op TEE 02/08/24 with EF 55-60% and severe MR - [facility] 2/25 with no CAD. RA 8, PA 41/17 (14), Fick CO/CI 7.76/4.41, PAPi 3, LVEDP 11 - Echo 4/25 with EF 35-40%, LV with GHK, RV normal, LA mod dilated, RA severely dilated, trivial MR, mod TR - Suspect drop in EF 2/2 persistent AF RVR.  - NYHA II-IIIa, IV on admission - CO-OX 74% off milrinone .  - Volume has improved on PO diuretics. CVP 4. BNP down trending. Continue Torsemide  40 mg BID  - Dig reduced to 0.0625 mg daily given recent elevated level. Dig level 0.5 02/23/24. - Continue spiro 25 mg daily - Continue UNNA boots   Atrial fibrillation  with RVR - S/p Maze and LAA clipping 4/25 - Failed DCCV X 2 04/17 - Back in SR 04/21. Back in a fib 4/23. Continue po amiodarone  400 BID.  - DCCV cancelled for today. Chemically converted back to NSR ~2am - Continue eliquis  5 mg BID.   Severe MR s/p MVR - Intra-op TEE with severe MR 02/08/24 - Trivial MR on echo 02/14/24  4. AKI - baseline Cr normal - Cr stable ~ 1.33 today - Watch with diuresis  7. Hypokalemia - Supp aggressively with diuresis  8. Hyponatremia - Na 125 - Received hypertonic saline 04/21 - Continue to follow  8. Debility - Continue aggressive PT/OT  Length of Stay: 16  Sheryl Donna, NP  02/24/2024, 8:45 AM  Patient seen and examined with the above-signed Advanced Practice Provider and/or Housestaff. I personally reviewed laboratory data, imaging studies and relevant notes. I independently examined the patient and formulated the important aspects of the plan. I have edited the note to reflect any of my changes or salient points. I have personally discussed the plan with the patient and/or family.  Feeling better. Back in NSR.   CVP and co-ok ok.   General:  Sitting in chair  No resp difficulty HEENT: normal Neck: supple. no JVD. Carotids 2+ bilat; no bruits. No lymphadenopathy or thryomegaly appreciated. Cor: Steranl wound ok  Regular rate & rhythm. No rubs, gallops or murmurs. Lungs: clear Abdomen: soft, nontender, nondistended. No hepatosplenomegaly. No bruits or masses. Good bowel sounds. Extremities: no cyanosis, clubbing, rash, edema Neuro: alert & orientedx3, cranial nerves grossly intact. moves all 4 extremities w/o difficulty. Affect pleasant  She is back in NSR. Continue po amio 200 bid. Volume status looks good. Co-ox ok.   She should be good for d/c from our standpoint. Will see again Monday if still in house.   May need to drop torsemide  back to 40 daily if Scr bumps.   Supp K. Stop digoxin    Add losartan  12.5. Eventual SGLT2i   Jules Oar, MD  1:13 PM

## 2024-02-24 NOTE — Progress Notes (Signed)
 Speech Language Pathology Treatment: Dysphagia  Patient Details Name: [name] MRN: [id number] DOB: September 14, 1941 Today's Date: 02/24/2024 Time: 0865-7846 SLP Time Calculation (min) (ACUTE ONLY): 8 min  Assessment / Plan / Recommendation Clinical Impression  Pt was seen for f/u after FEES on 4/23 to review education and recommendations. Subjectively she believes that her swallowing is improving and that she is not clearing her throat as much, although note that she was clearing her throat while SLP was present. She was also eating a late breakfast and had no overt s/s of oropharyngeal dysphagia or aspiration otherwise. Education was reviewed from FEES, including recommendation for reflux precautions and some strategies to mitigate throat clearing. Vocal hygiene was also discussed. Hope that as overall strength and breath support improve, and if her symptoms of her GERD subside, her voice and throat clearing may improve. However, encouraged pt to pursue f/u SLP services post-discharge if her voice remains persistently hoarse and if throat clearing is ongoing.   HPI HPI: 83 y.o. female presents to [facility] 02/08/24 for complex MVR and MAZE (via sternotomy). Intubated for <24 hours. Per RN, pt has been coughing intermittently with PO intake, throat-clearing continuously. She c/o difficulty chewing solid foods. SLP consulted 4/17. PMHx: anemia, HTN, mitral valve prolapse, moderate mitral regurgitation, tricuspid regurgitation      SLP Plan  All goals met      Recommendations for follow up therapy are one component of a multi-disciplinary discharge planning process, led by the attending physician.  Recommendations may be updated based on patient status, additional functional criteria and insurance authorization.    Recommendations  Diet recommendations: Regular;Thin liquid Liquids provided via: Straw;Cup Medication Administration: Whole meds with liquid (per pt preference - may want to take larger pills  with puree) Supervision: Patient able to self feed;Intermittent supervision to cue for compensatory strategies Compensations: Slow rate;Small sips/bites;Follow solids with liquid Postural Changes and/or Swallow Maneuvers: Seated upright 90 degrees;Upright 30-60 min after meal                  Oral care BID     Dysphagia, unspecified (R13.10)     All goals met     Beth Brooke., M.A. CCC-SLP Acute Rehabilitation Services Office: [phone]  Secure chat preferred   02/24/2024, 12:40 PM

## 2024-02-24 NOTE — Progress Notes (Signed)
 Occupational Therapy Treatment Patient Details Name: [name] MRN: [id number] DOB: February 02, 1941 Today's Date: 02/24/2024   History of present illness 84 y.o. female presents to [facility] 02/08/24 for complex MVR and MAZE (via sternotomy). Post-op complicated by persistent a fib RVR and volume overload. Cardioversion x2 4/18 without resolution of afib.  4/21 returned to NSR. 4/9-4/23 ICU stay. Plan for repeat DCCV 4/25. PMHx: anemia, HTN, mitral valve prolapse, moderate mitral regurgitation, tricuspid regurgitation   OT comments  OT session focused on training in techniques for increased safety and independence with ADLs and bed mobility and functional mobility/transfers during/in preparation for ADLs. Pt currently demonstrates ability to complete UB ADLs with Set up to Min assist, LB ADLs with Min to Mod assist, bed mobility with Supervision to Mod assist, and functional mobility/transfers with Contact guard assist with a RW, all while adhering to sternal precautions. Pt now able to independently state sternal precautions but continues to require cues to adhere to them during functional tasks. Pt HR in the 80s to 90s and O2 sat >/93% on RA throughout session. Pt participated well and is making progress toward goals. OT goals updated this session based on pt progress and current functional level. Pt will benefit from continued acute skilled OT services to address deficits outlined below and increase safety and independence with functional tasks. Post acute discharge, pt will benefit from [facility] OT to maximize rehab potential and decrease the risk of rehospitalization.       If plan is discharge home, recommend the following:  A little help with walking and/or transfers;A lot of help with bathing/dressing/bathroom;Assistance with cooking/housework;Assist for transportation;Help with stairs or ramp for entrance   Equipment Recommendations  BSC/3in1    Recommendations for Other Services      Precautions /  Restrictions Precautions Precautions: Fall;Sternal Precaution Booklet Issued: Yes (comment) Recall of Precautions/Restrictions: Impaired Precaution/Restrictions Comments: Watch HR, needs cues for sternal precautions throughout session Restrictions Other Position/Activity Restrictions: sternal       Mobility Bed Mobility Overal bed mobility: Needs Assistance Bed Mobility: Sidelying to Sit, Sit to Sidelying   Sidelying to sit: Contact guard assist     Sit to sidelying: Mod assist General bed mobility comments: Mod assist to elevate B LE into bed; cues for technique; with use of heart pillow    Transfers Overall transfer level: Needs assistance Equipment used: None, Rolling walker (2 wheels) (heart pillow) Transfers: Sit to/from Stand, Bed to chair/wheelchair/BSC Sit to Stand: Contact guard assist (with use of heart pillow)     Step pivot transfers: Contact guard assist           Balance Overall balance assessment: Needs assistance, Mild deficits observed, not formally tested Sitting-balance support: No upper extremity supported, Feet supported Sitting balance-Leahy Scale: Fair     Standing balance support: Single extremity supported, Bilateral upper extremity supported, During functional activity Standing balance-Leahy Scale: Fair Standing balance comment: able to stand statically; RW for dynamic standing during tasks and for functional transfers/mobility                           ADL either performed or assessed with clinical judgement   ADL Overall ADL's : Needs assistance/impaired Eating/Feeding: Set up;Sitting   Grooming: Contact guard assist;Standing (cues to adhere to sternal precautions)   Upper Body Bathing: Contact guard assist;Minimal assistance;Cueing for compensatory techniques;Sitting (cues to adhere to sternal precautions) Upper Body Bathing Details (indicate cue type and reason): simulated at EOB Lower  Body Bathing: Minimal  assistance;Moderate assistance;Cueing for compensatory techniques;Sitting/lateral leans;Sit to/from stand (cues to adhere to sternal precautions)   Upper Body Dressing : Contact guard assist;Minimal assistance;Cueing for compensatory techniques;Sitting (cues to adhere to sternal precautions)   Lower Body Dressing: Moderate assistance;Cueing for compensatory techniques;Sit to/from stand (cues to adhere to sternal precautions)   Toilet Transfer: Contact guard assist;Ambulation;BSC/3in1;Rolling walker (2 wheels) (cues to adhere to sternal precautions; cues for hand placement)   Toileting- Clothing Manipulation and Hygiene: Moderate assistance;Sit to/from stand       Functional mobility during ADLs: Contact guard assist;Rolling walker (2 wheels) (cues to adhere to sternal precautions) General ADL Comments: OT provided handouts and initiated training in energy conservation strategies this session. Pt will benefit from further education and review.    Extremity/Trunk Assessment              Occupational psychologist Communication: Impaired Factors Affecting Communication: Hearing impaired (has hearing aids)   Cognition Arousal: Alert Behavior During Therapy: WFL for tasks assessed/performed, Flat affect Cognition: No apparent impairments             OT - Cognition Comments: Pt AOOx4 and pleasant throughout session; continues to benefit from cues for new learning and functional implementation of precautions. Able to Indepenedntly state sternal precautions but continues to require Mod cues to adhere to precautions during functional activities.                 Following commands: Intact        Cueing   Cueing Techniques: Verbal cues  Exercises      Shoulder Instructions       General Comments HR in the 80s to 90s and O2 sat >/93% on RA throughout session. Husband present throughout session. NT present during a portion of  session.    Pertinent Vitals/ Pain       Pain Assessment Pain Assessment: Faces Faces Pain Scale: Hurts a little bit Pain Location: sternal incision Pain Descriptors / Indicators: Sore Pain Intervention(s): Monitored during session  Home Living                                          Prior Functioning/Environment              Frequency  Min 2X/week        Progress Toward Goals  OT Goals(current goals can now be found in the care plan section)  Progress towards OT goals: Progressing toward goals;Goals updated  Acute Rehab OT Goals Patient Stated Goal: to return home OT Goal Formulation: With patient Time For Goal Achievement: 03/09/24 Potential to Achieve Goals: Good ADL Goals Pt Will Perform Grooming: with supervision;standing Pt Will Perform Lower Body Bathing: with contact guard assist;sitting/lateral leans;sit to/from stand Pt Will Perform Upper Body Dressing: with supervision;sitting Pt Will Perform Lower Body Dressing: with contact guard assist;sitting/lateral leans;sit to/from stand Pt Will Transfer to Toilet: with supervision;ambulating;regular height toilet Pt Will Perform Toileting - Clothing Manipulation and hygiene: with supervision;sitting/lateral leans;sit to/from stand Additional ADL Goal #1: Patient will demonstrate ability to Independently state 4 energy conservation strategies to increase safety and independence with functional tasks with handout provided.  Plan      Co-evaluation  AM-PAC OT "6 Clicks" Daily Activity     Outcome Measure   Help from another person eating meals?: A Little Help from another person taking care of personal grooming?: A Little Help from another person toileting, which includes using toliet, bedpan, or urinal?: A Lot Help from another person bathing (including washing, rinsing, drying)?: A Lot Help from another person to put on and taking off regular upper body clothing?: A  Little Help from another person to put on and taking off regular lower body clothing?: A Lot 6 Click Score: 15    End of Session Equipment Utilized During Treatment: Rolling walker (2 wheels);Other (comment) (heart pillow)  OT Visit Diagnosis: Other abnormalities of gait and mobility (R26.89);Muscle weakness (generalized) (M62.81);Other (comment) (decreased activity tolerance)   Activity Tolerance Patient tolerated treatment well   Patient Left in bed;with call bell/phone within reach;with family/visitor present   Nurse Communication Mobility status        Time: 1610-9604 OT Time Calculation (min): 43 min  Charges: OT General Charges $OT Visit: 1 Visit OT Treatments $Self Care/Home Management : 38-52 mins  Everlie Eble "Darral Ellis., OTR/L, MA Acute Rehab [phone]  Walt Gunner 02/24/2024, 6:30 PM

## 2024-02-24 NOTE — Progress Notes (Signed)
      [address]       [place] [address]             [phone]        16 Days Post-Op Procedure(s) (LRB): MITRAL VALVE REPAIR USING A SIMULUS SEMI-RIGID ANNULOPLASTY BAND (N/A) MAZE PROCEDURE (N/A) CLIPPING, LEFT ATRIAL APPENDAGE USING A MEDTRONIC CLIP (N/A) ECHOCARDIOGRAM, TRANSESOPHAGEAL (N/A)  Subjective: Patient sitting in chair using incentive spirometer this am. She states her breathing seems better but her "butt" hurts  Objective: Vital signs in last 24 hours: Temp:  [97.7 F (36.5 C)-98.2 F (36.8 C)] 97.8 F (36.6 C) (04/25 0348) Pulse Rate:  [81-113] 85 (04/25 0400) Cardiac Rhythm: Atrial fibrillation (04/24 1930) Resp:  [12-23] 23 (04/25 0543) BP: (91-110)/(48-67) 91/55 (04/25 0348) SpO2:  [94 %-100 %] 94 % (04/25 0400) Weight:  [74.4 kg] 74.4 kg (04/25 0543)  Pre op weight 68.5 kg Current Weight  02/24/24 74.4 kg    Hemodynamic parameters for last 24 hours: CVP:  [7 mmHg-23 mmHg] 15 mmHg  Intake/Output from previous day: 04/24 0701 - 04/25 0700 In: 569.1 [P.O.:480; I.V.:89.1] Out: 20 [Urine:20]   Physical Exam:  Cardiovascular: RRR Pulmonary: Diminished bibasilar breath sounds Abdomen: Soft, non tender, bowel sounds present. Extremities: Unna boots Wounds: Clean and dry.  No erythema or signs of infection.  Lab Results: CBC: Recent Labs    02/22/24 0838 02/24/24 0425  WBC 16.9* 12.9*  HGB 8.8* 8.3*  HCT 27.1* 25.4*  PLT 336 284   BMET:  Recent Labs    02/23/24 0430 02/24/24 0425  NA 124* 125*  K 3.7 3.4*  CL 87* 88*  CO2 26 26  GLUCOSE 106* 96  BUN 20 20  CREATININE 1.28* 1.33*  CALCIUM  8.0* 7.9*    PT/INR:  Lab Results  Component Value Date   INR 1.6 (H) 02/08/2024   INR 1.0 02/07/2024   ABG:  INR: Will add last result for INR, ABG once components are confirmed Will add last 4 CBG results once components are confirmed  Assessment/Plan: CV-Has had persistent fib with RVR. S/p DCCV  04/18 (failed).  SR with HR in the 80's this am.  Co ox this am 73.9 Milrinone  stopped 04/22. On Amiodarone  400 mg bid,  Digoxin  0.0625 daily, and Apixaban  5 mg bid. DCCV is scheduled for today. Pulmonary-On room air. Encourage incentive spirometer. Acute systolic heart failure, non ischemic cardiomyopathy-on Spironolactone  25 mg daily, Torsemide  40 mg daily, and Unna boots. CVP 11 this am. BNP decreased to 516.8 this am. AHF following Expected post op blood loss anemia-Last H and H 8.3 and 25.4 5. Hyponatremia-sodium this am 125 (has been 123-124 mostly of late).  6. Creatinine this am 1.35. Creatinine prior to admission 1.15. 7. Supplement potassium 8. Deconditioned-continue PT  Ludia Gartland M ZimmermanPA-C 6:53 AM

## 2024-02-24 NOTE — Plan of Care (Signed)

## 2024-02-25 DIAGNOSIS — I340 Nonrheumatic mitral (valve) insufficiency: Secondary | ICD-10-CM | POA: Diagnosis not present

## 2024-02-25 LAB — BASIC METABOLIC PANEL WITH GFR
Anion gap: 11 (ref 5–15)
BUN: 19 mg/dL (ref 8–23)
CO2: 26 mmol/L (ref 22–32)
Calcium: 8 mg/dL — ABNORMAL LOW (ref 8.9–10.3)
Chloride: 89 mmol/L — ABNORMAL LOW (ref 98–111)
Creatinine, Ser: 1.4 mg/dL — ABNORMAL HIGH (ref 0.44–1.00)
GFR, Estimated: 38 mL/min — ABNORMAL LOW (ref >60)
Glucose, Bld: 112 mg/dL — ABNORMAL HIGH (ref 70–99)
Potassium: 4 mmol/L (ref 3.5–5.1)
Sodium: 126 mmol/L — ABNORMAL LOW (ref 135–145)

## 2024-02-25 LAB — MAGNESIUM: Magnesium: 1.7 mg/dL (ref 1.7–2.4)

## 2024-02-25 LAB — BRAIN NATRIURETIC PEPTIDE: B Natriuretic Peptide: 420 pg/mL — ABNORMAL HIGH (ref 0.0–100.0)

## 2024-02-25 LAB — COOXEMETRY PANEL
Carboxyhemoglobin: 2.1 % — ABNORMAL HIGH (ref 0.5–1.5)
Methemoglobin: <0.7 % (ref 0.0–1.5)
O2 Saturation: 78 %
Total hemoglobin: 9.3 g/dL — ABNORMAL LOW (ref 12.0–16.0)

## 2024-02-25 LAB — PROCALCITONIN: Procalcitonin: <0.1 ng/mL

## 2024-02-25 MED ORDER — MAGNESIUM GLUCONATE 500 (27 MG) MG PO TABS
500.0000 mg | ORAL_TABLET | Freq: Every day | ORAL | Status: DC
  Administered 2024-02-25 – 2024-03-10 (×15): 500 mg via ORAL
  Filled 2024-02-25 (×15): qty 1

## 2024-02-25 MED ORDER — MELATONIN 3 MG PO TABS
3.0000 mg | ORAL_TABLET | Freq: Every day | ORAL | Status: DC
  Administered 2024-02-25 – 2024-03-09 (×14): 3 mg via ORAL
  Filled 2024-02-25 (×14): qty 1

## 2024-02-25 MED ORDER — TORSEMIDE 20 MG PO TABS
40.0000 mg | ORAL_TABLET | Freq: Every day | ORAL | Status: DC
  Administered 2024-02-25: 40 mg via ORAL
  Filled 2024-02-25: qty 2

## 2024-02-25 MED ORDER — POTASSIUM CHLORIDE CRYS ER 20 MEQ PO TBCR
20.0000 meq | EXTENDED_RELEASE_TABLET | Freq: Every day | ORAL | Status: DC
  Administered 2024-02-25 – 2024-02-27 (×3): 20 meq via ORAL
  Filled 2024-02-25 (×3): qty 1

## 2024-02-25 NOTE — Progress Notes (Addendum)
 17 Days Post-Op Procedure(s) (LRB): MITRAL VALVE REPAIR USING A SIMULUS SEMI-RIGID ANNULOPLASTY BAND (N/A) MAZE PROCEDURE (N/A) CLIPPING, LEFT ATRIAL APPENDAGE USING A MEDTRONIC CLIP (N/A) ECHOCARDIOGRAM, TRANSESOPHAGEAL (N/A) Subjective: Conts to improve with mobility, feeling weak but better( stronger)  Objective: Vital signs in last 24 hours: Temp:  [97.6 F (36.4 C)-98.1 F (36.7 C)] 98 F (36.7 C) (04/26 0731) Pulse Rate:  [75-98] 78 (04/26 0731) Cardiac Rhythm: Normal sinus rhythm;Bundle branch block (04/25 2045) Resp:  [19-24] 20 (04/26 0731) BP: (101-112)/(45-63) 105/53 (04/26 0731) SpO2:  [94 %-99 %] 94 % (04/26 0731) Weight:  [74.1 kg] 74.1 kg (04/26 0500)  Hemodynamic parameters for last 24 hours: CVP:  [10 mmHg-19 mmHg] 10 mmHg  Intake/Output from previous day: 04/25 0701 - 04/26 0700 In: 420 [P.O.:420] Out: 900 [Urine:900] Intake/Output this shift: No intake/output data recorded.  General appearance: alert, cooperative, and no distress Heart: regular rate and rhythm Lungs: dim in bases Abdomen: benign Extremities: wrap in place so hard to tell about edema Wound: incis healing well  Lab Results: Recent Labs    02/22/24 0838 02/24/24 0425  WBC 16.9* 12.9*  HGB 8.8* 8.3*  HCT 27.1* 25.4*  PLT 336 284   BMET:  Recent Labs    02/24/24 0425 02/25/24 0436  NA 125* 126*  K 3.4* 4.0  CL 88* 89*  CO2 26 26  GLUCOSE 96 112*  BUN 20 19  CREATININE 1.33* 1.40*  CALCIUM  7.9* 8.0*    PT/INR: No results for input(s): "LABPROT", "INR" in the last 72 hours. ABG    Component Value Date/Time   PHART 7.367 02/08/2024 1849   HCO3 23.9 02/08/2024 1849   TCO2 25 02/08/2024 1849   ACIDBASEDEF 2.0 02/08/2024 1849   O2SAT 78 02/25/2024 0436   CBG (last 3)  Recent Labs    02/22/24 1130  GLUCAP 122*    Meds Scheduled Meds:  amiodarone   400 mg Oral BID   apixaban   5 mg Oral BID   atorvastatin   10 mg Oral Daily   Chlorhexidine  Gluconate  Cloth  6 each Topical Daily   [facility], Patient & Family Education   Does not apply Once   ezetimibe   10 mg Oral Daily   feeding supplement  237 mL Oral BID BM   losartan   12.5 mg Oral Daily   multivitamin with minerals  1 tablet Oral Daily   pantoprazole   40 mg Oral Daily   potassium chloride   40 mEq Oral Daily   sodium chloride  flush  3-10 mL Intravenous Q12H   spironolactone   25 mg Oral Daily   torsemide   40 mg Oral BID   Continuous Infusions:  promethazine  (PHENERGAN ) injection (IM or IVPB) Stopped (02/20/24 0000)   PRN Meds:.loperamide , ondansetron  (ZOFRAN ) IV, mouth rinse, oxyCODONE , phenol, polyethylene glycol, promethazine  (PHENERGAN ) injection (IM or IVPB), sodium chloride  flush, traMADol , witch hazel-glycerin   Xrays No results found.  Assessment/Plan: S/P Procedure(s) (LRB): MITRAL VALVE REPAIR USING A SIMULUS SEMI-RIGID ANNULOPLASTY BAND (N/A) MAZE PROCEDURE (N/A) CLIPPING, LEFT ATRIAL APPENDAGE USING A MEDTRONIC CLIP (N/A) ECHOCARDIOGRAM, TRANSESOPHAGEAL (N/A) POD#17  1 afeb, VSS, SR w/ BBB 2 O2 sats good on RA 3 weight stable for several days 4 voiding well- not all measured 5 CO-ox 78 6 hyponatremia- trending better, sodium 126 7 creat slightly higher at 1.4, relatively stable for several days- will decrease torsemide  to 40 8 MG++ 1,7- will replace 9 BNP trending lower, 420  10 poss home in am if  has a good day with rehab- will have home PT      LOS: 17 days    [name] Revering PA-C Pager [phone] 02/25/2024   Agree Dispo planning  Kameren Baade [place]

## 2024-02-25 NOTE — Plan of Care (Signed)

## 2024-02-25 NOTE — Progress Notes (Signed)
   Patient Name: [name] Date of Encounter: 02/25/2024 [facility] Cardiologist: [name], MD   Interval Summary  .    Sitting in chair comfortable, no new complaints  Vital Signs .    Vitals:   02/25/24 0300 02/25/24 0418 02/25/24 0500 02/25/24 0731  BP:  (!) 102/49  (!) 105/53  Pulse: 98 98  78  Resp: 20 19  20   Temp:  98.1 F (36.7 C)  98 F (36.7 C)  TempSrc:  Oral  Oral  SpO2:  97%  94%  Weight:   74.1 kg   Height:        Intake/Output Summary (Last 24 hours) at 02/25/2024 0844 Last data filed at 02/25/2024 0622 Gross per 24 hour  Intake 420 ml  Output 750 ml  Net -330 ml      02/25/2024    5:00 AM 02/24/2024    5:43 AM 02/23/2024    3:36 AM  Last 3 Weights  Weight (lbs) 163 lb 5.8 oz 164 lb 0.4 oz 165 lb 2 oz  Weight (kg) 74.1 kg 74.4 kg 74.9 kg      Telemetry/ECG    Maintaining sinus rhythm- Personally Reviewed  Physical Exam .   GEN: No acute distress.   Neck: No JVD Cardiac: Sternal wound OK. RRR, no murmurs, rubs, or gallops.  Respiratory: Clear to auscultation bilaterally. GI: Soft, nontender, non-distended  MS: Mild BLE edema  Assessment & Plan .     83 year old with mitral valve repair Maze procedure left atrial appendage clipping  Acute systolic heart failure nonischemic cardiomyopathy - EF postop 35 to 40% Co. ox now 74% off milrinone  - Digoxin  stopped - Spironolactone  25 mg a day - Will decrease torsemide  to 40 mg once a day given slight creatinine bump. -Added losartan  12.5 mg yesterday.  Eventually as outpatient SGLT2 inhibitor will be added. -Volume status reasonable.  Possible discharge today or tomorrow.  A-fib with RVR - Status post maze failed cardioversion x 2 on 4/17 now she is back in normal sinus rhythm continue with amiodarone  load.  Hyponatremia hypokalemia previously noted.  For questions or updates, please contact [facility] Please consult [email or web address] for contact info under         Signed, Dorothye Gathers, MD

## 2024-02-25 NOTE — Progress Notes (Signed)
 Received patient in bed, spouse in room. Provided education s/p MV Repair. Provided OHS booklet. Reviewed s/s to report to MD, precautions and activity restrictions. Reinforced the move-in-the tube concept. Encouraged the use of the I/S 10 x/hr, pt pulled 650 mL. Reviewed wound care and stressed daily weights noting s/s of fluid overload. Stressed medication compliance and f/u with MD. Provided handouts on heart healthy nutrition. Reviewed activity progress once patient is discharged home; pt voices being d/c to inpatient rehab Monday. Referral to [facility] for the CRP2 program. Pt and spouse verbalized understanding of the education provided.   Rosezena Contes MS, ACSM-CEP, CCRP   11:05 - 11:49

## 2024-02-26 ENCOUNTER — Inpatient Hospital Stay (HOSPITAL_COMMUNITY)

## 2024-02-26 DIAGNOSIS — I340 Nonrheumatic mitral (valve) insufficiency: Secondary | ICD-10-CM | POA: Diagnosis not present

## 2024-02-26 LAB — BASIC METABOLIC PANEL WITH GFR
Anion gap: 11 (ref 5–15)
Anion gap: 12 (ref 5–15)
Anion gap: 13 (ref 5–15)
Anion gap: 11 (ref 5–15)
BUN: 18 mg/dL (ref 8–23)
BUN: 18 mg/dL (ref 8–23)
BUN: 27 mg/dL — ABNORMAL HIGH (ref 8–23)
BUN: 32 mg/dL — ABNORMAL HIGH (ref 8–23)
CO2: 23 mmol/L (ref 22–32)
CO2: 24 mmol/L (ref 22–32)
CO2: 24 mmol/L (ref 22–32)
CO2: 25 mmol/L (ref 22–32)
Calcium: 7.5 mg/dL — ABNORMAL LOW (ref 8.9–10.3)
Calcium: 8.2 mg/dL — ABNORMAL LOW (ref 8.9–10.3)
Calcium: 8 mg/dL — ABNORMAL LOW (ref 8.9–10.3)
Calcium: 8.1 mg/dL — ABNORMAL LOW (ref 8.9–10.3)
Chloride: 89 mmol/L — ABNORMAL LOW (ref 98–111)
Chloride: 84 mmol/L — ABNORMAL LOW (ref 98–111)
Chloride: 83 mmol/L — ABNORMAL LOW (ref 98–111)
Chloride: 86 mmol/L — ABNORMAL LOW (ref 98–111)
Creatinine, Ser: 1.25 mg/dL — ABNORMAL HIGH (ref 0.44–1.00)
Creatinine, Ser: 1.35 mg/dL — ABNORMAL HIGH (ref 0.44–1.00)
Creatinine, Ser: 1.37 mg/dL — ABNORMAL HIGH (ref 0.44–1.00)
Creatinine, Ser: 1.26 mg/dL — ABNORMAL HIGH (ref 0.44–1.00)
GFR, Estimated: 44 mL/min — ABNORMAL LOW (ref >60)
GFR, Estimated: 40 mL/min — ABNORMAL LOW (ref >60)
GFR, Estimated: 39 mL/min — ABNORMAL LOW (ref >60)
GFR, Estimated: 43 mL/min — ABNORMAL LOW (ref >60)
Glucose, Bld: 99 mg/dL (ref 70–99)
Glucose, Bld: 113 mg/dL — ABNORMAL HIGH (ref 70–99)
Glucose, Bld: 134 mg/dL — ABNORMAL HIGH (ref 70–99)
Glucose, Bld: 119 mg/dL — ABNORMAL HIGH (ref 70–99)
Potassium: 3.8 mmol/L (ref 3.5–5.1)
Potassium: 3.9 mmol/L (ref 3.5–5.1)
Potassium: 3.8 mmol/L (ref 3.5–5.1)
Potassium: 4.2 mmol/L (ref 3.5–5.1)
Sodium: 123 mmol/L — ABNORMAL LOW (ref 135–145)
Sodium: 120 mmol/L — ABNORMAL LOW (ref 135–145)
Sodium: 120 mmol/L — ABNORMAL LOW (ref 135–145)
Sodium: 122 mmol/L — ABNORMAL LOW (ref 135–145)

## 2024-02-26 LAB — MAGNESIUM: Magnesium: 1.6 mg/dL — ABNORMAL LOW (ref 1.7–2.4)

## 2024-02-26 LAB — COOXEMETRY PANEL
Carboxyhemoglobin: 2.2 % — ABNORMAL HIGH (ref 0.5–1.5)
Methemoglobin: <0.7 % (ref 0.0–1.5)
O2 Saturation: 68.3 %
Total hemoglobin: 8.9 g/dL — ABNORMAL LOW (ref 12.0–16.0)

## 2024-02-26 LAB — OSMOLALITY, URINE: Osmolality, Ur: 275 mosm/kg — ABNORMAL LOW (ref 300–900)

## 2024-02-26 LAB — SODIUM, URINE, RANDOM: Sodium, Ur: 46 mmol/L

## 2024-02-26 MED ORDER — UREA 15 G PO PACK
15.0000 g | PACK | Freq: Two times a day (BID) | ORAL | Status: DC
  Administered 2024-02-26: 15 g via ORAL
  Filled 2024-02-26 (×2): qty 1

## 2024-02-26 MED ORDER — UREA 15 G PO PACK
30.0000 g | PACK | Freq: Two times a day (BID) | ORAL | Status: DC
  Administered 2024-02-26 – 2024-02-27 (×3): 30 g via ORAL
  Filled 2024-02-26 (×4): qty 2

## 2024-02-26 MED ORDER — DAPAGLIFLOZIN PROPANEDIOL 10 MG PO TABS
10.0000 mg | ORAL_TABLET | Freq: Every day | ORAL | Status: DC
  Administered 2024-02-26 – 2024-02-29 (×4): 10 mg via ORAL
  Filled 2024-02-26 (×4): qty 1

## 2024-02-26 MED ORDER — FUROSEMIDE 10 MG/ML IJ SOLN
80.0000 mg | Freq: Four times a day (QID) | INTRAMUSCULAR | Status: AC
  Administered 2024-02-26 (×2): 80 mg via INTRAVENOUS
  Filled 2024-02-26 (×2): qty 8

## 2024-02-26 MED ORDER — POTASSIUM CHLORIDE CRYS ER 20 MEQ PO TBCR
20.0000 meq | EXTENDED_RELEASE_TABLET | Freq: Once | ORAL | Status: AC
  Administered 2024-02-26: 20 meq via ORAL
  Filled 2024-02-26: qty 1

## 2024-02-26 NOTE — Consult Note (Addendum)
 Nephrology Consult   Requesting provider: [facility] requesting consult: CT surg Reason for consult: Hyponatremia   Assessment/Recommendations: [name] is a/an 83 y.o. female with a past medical history HTN and PAF who presents with shortness of breath with severe mitral regurgitation status post mitral valve replacement, MAZE, and LAA clipping.  Course complicated by systolic heart failure.  We were consulted for hyponatremia.  Hypervolemic hyponatremia: Worsening and persistent.  No obvious symptoms.  Poor appetite could be multifactorial.  Most likely related to volume excess but SIADH is possible - IV Lasix  80 mg 2 times today -Also will start SGLT2 which has benefit in the setting of hyponatremia particularly with volume overload and heart failure - Continue with diuresis as needed - Obtain urine osmolality and urine sodium - Could consider urea if SIADH felt to be playing a role - Monitor sodium every 6 hours today  ADDENDUM: repeat Na lower at 120 but haven't had enough time for therapies to really kick in. Urine sodium and osmolality essentially unequivocal but likely reflects both suspected processes of some ADH excess (SIADH) and volume overload. Will continue with diuretics as planned and add urea 15g BID. CTM Na closely. Consider hypertonic if Na dips further.   Nonoliguric AKI: Baseline creatinine normal.  Creatinine has been elevated for most of the hospitalization but elevation has been fairly mild.  Likely some degree of ischemic injury during surgery.  Cholesterol embolic injury is always possible.  Also may just have lower GFR in the setting of her heart failure with new cardiorenal syndrome.  Will continue to monitor with diuresis.  Overall not a major concern   Severe mitral regurgitation: Status post placement  Nonischemic cardiomyopathy: Somewhat related to atrial fibrillation.  Followed by heart failure team previously.  Continue with diuresis as above.   Continue with goal-directed medical therapy which has already been initiated.  Monitor low blood pressures closely.  Will also start SGLT2i which has efficacy in CHF but also in hyponatremia mgmt  Atrial fibrillation with RVR: Management per primary team  Hypokalemia: On spironolactone .  Supplement as needed  Recommendations conveyed to primary service.    Levorn Reason [facility] 02/26/2024 9:14 AM   _____________________________________________________________________________________ CC: SOB  History of Present Illness: [name] is a/an 83 y.o. female with a past medical history of HTN and PAF who presents with shortness of breath with severe mitral regurgitation status post mitral valve replacement, MAZE, and LAA clipping.  Course complicated by systolic heart failure.  We were consulted for hyponatremia.  Patient is on day 18 of hospitalization.  She was admitted after having shortness of breath and found to have severe mitral regurgitation.  She underwent mitral valve replacement as well as maze procedure and LAA clipping.  During the hospitalization she also had echocardiogram that demonstrated ejection fraction around 35 to 40%.  It was felt that this drop was related to atrial fibrillation with RVR.  She underwent diuresis throughout the hospitalization.  She has been on amiodarone  for atrial fibrillation and was able to return to sinus rhythm.  She suffered a small AKI during the hospitalization but was not severe.  She has been having worsening hyponatremia since around 4/16.  Sodium is fluctuated but most recently around 125.  Sodium this morning was 123.  The patient denies any confusion.  She does state that she feels short of breath particularly with exertion.  Her appetite is fairly poor but she denies nausea or vomiting.  No dysuria or hematuria.  Denies chest pain.  Does not remember having issues with hyponatremia in the past.   Medications:  Current  Facility-Administered Medications  Medication Dose Route Frequency Provider Last Rate Last Admin   amiodarone  (PACERONE ) tablet 400 mg  400 mg Oral BID Dennise Fitz L, NP   400 mg at 02/25/24 2104   apixaban  (ELIQUIS ) tablet 5 mg  5 mg Oral BID Zimmerman, Donielle M, PA-C   5 mg at 02/25/24 2104   atorvastatin  (LIPITOR) tablet 10 mg  10 mg Oral Daily Zimmerman, Donielle M, PA-C   10 mg at 02/25/24 0920   Chlorhexidine  Gluconate Cloth 2 % PADS 6 each  6 each Topical Daily Zimmerman, Donielle M, PA-C   6 each at 02/25/24 8295   [facility] Cardiac Surgery, Patient & Family Education   Does not apply Once Zimmerman, Donielle M, PA-C       ezetimibe  (ZETIA ) tablet 10 mg  10 mg Oral Daily Zimmerman, Donielle M, PA-C   10 mg at 02/25/24 0920   feeding supplement (ENSURE ENLIVE / ENSURE PLUS) liquid 237 mL  237 mL Oral BID BM Trevor Fudge, MD   237 mL at 02/25/24 1420   furosemide  (LASIX ) injection 80 mg  80 mg Intravenous Q6H Levorn Reason, MD       loperamide  (IMODIUM ) capsule 2 mg  2 mg Oral PRN Zimmerman, Donielle M, PA-C   2 mg at 02/25/24 2104   losartan  (COZAAR ) tablet 12.5 mg  12.5 mg Oral Daily Bensimhon, Daniel R, MD   12.5 mg at 02/25/24 6213   magnesium  gluconate (MAGONATE) tablet 500 mg  500 mg Oral Daily Gold, Wayne E, PA-C   500 mg at 02/25/24 0865   melatonin tablet 3 mg  3 mg Oral QHS Philmore Bream, MD   3 mg at 02/25/24 2319   multivitamin with minerals tablet 1 tablet  1 tablet Oral Daily Chand, Sudham, MD   1 tablet at 02/25/24 0920   ondansetron  (ZOFRAN ) injection 4 mg  4 mg Intravenous Q6H PRN Zimmerman, Donielle M, PA-C   4 mg at 02/20/24 1803   Oral care mouth rinse  15 mL Mouth Rinse PRN Zimmerman, Donielle M, PA-C       oxyCODONE  (Oxy IR/ROXICODONE ) immediate release tablet 5 mg  5 mg Oral Q4H PRN Zimmerman, Donielle M, PA-C   5 mg at 02/24/24 2112   pantoprazole  (PROTONIX ) EC tablet 40 mg  40 mg Oral Daily Zimmerman, Donielle M, PA-C   40 mg at 02/25/24 0920   phenol  (CHLORASEPTIC) mouth spray 1 spray  1 spray Mouth/Throat PRN Zimmerman, Donielle M, PA-C       polyethylene glycol (MIRALAX  / GLYCOLAX ) packet 17 g  17 g Oral Daily PRN Zimmerman, Donielle M, PA-C   17 g at 02/21/24 1925   potassium chloride  SA (KLOR-CON  M) CR tablet 20 mEq  20 mEq Oral Daily Gold, Wayne E, PA-C   20 mEq at 02/25/24 7846   promethazine  (PHENERGAN ) 12.5 mg in sodium chloride  0.9 % 50 mL IVPB  12.5 mg Intravenous Q6H PRN Allegra Arch, PA-C   Stopped at 02/20/24 0000   sodium chloride  flush (NS) 0.9 % injection 3 mL  3 mL Intravenous PRN Moira Andrews M, PA-C       sodium chloride  flush (NS) 0.9 % injection 3-10 mL  3-10 mL Intravenous Q12H Zimmerman, Donielle M, PA-C   10 mL at 02/25/24 2105   spironolactone  (ALDACTONE ) tablet 25 mg  25 mg Oral Daily Zimmerman, Donielle M, PA-C   25 mg at 02/25/24 0920   traMADol  (ULTRAM ) tablet 50 mg  50 mg Oral Q12H PRN Allegra Arch, PA-C   50 mg at 02/23/24 1233   witch hazel-glycerin  (TUCKS) pad   Topical PRN Zimmerman, Donielle M, PA-C         ALLERGIES Crestor [rosuvastatin], Glycopyrrolate , and Codeine  MEDICAL HISTORY Past Medical History:  Diagnosis Date   Acid reflux    Anemia    Arthritis    Atrial fibrillation (HCC)    Cataract    Diverticulosis of colon    Endometrial cancer (HCC)    1A grade endometrial ca   Essential hypertension 01/24/2009   Qualifier: Diagnosis of  By: Nelson-Smith CMA (AAMA), Dottie     Hx of radiation therapy 04/24/08-05/29/08& 8/12/,8/19,&06/27/08   external beam  through 7/09 ,then intracavity in 06/2008   Hypertension    Mitral valve prolapse    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   Tricuspid regurgitation      SOCIAL HISTORY Social History   Socioeconomic History   Marital status: Married    Spouse name: Not on file   Number of children: 1   Years of education: Not on file   Highest education level: Not on file  Occupational History   Occupation:  Environmental health practitioner  Tobacco Use   Smoking status: Never   Smokeless tobacco: Never  Vaping Use   Vaping status: Never Used  Substance and Sexual Activity   Alcohol use: Yes    Comment: rarely   Drug use: No   Sexual activity: Never  Other Topics Concern   Not on file  Social History Narrative   Not on file   Social Drivers of Health   Financial Resource Strain: Not on file  Food Insecurity: No Food Insecurity (02/08/2024)   Hunger Vital Sign    Worried About Running Out of Food in the Last Year: Never true    Ran Out of Food in the Last Year: Never true  Transportation Needs: No Transportation Needs (02/08/2024)   PRAPARE - Administrator, Civil Service (Medical): No    Lack of Transportation (Non-Medical): No  Physical Activity: Not on file  Stress: Not on file  Social Connections: Socially Integrated (02/09/2024)   Social Connection and Isolation Panel [NHANES]    Frequency of Communication with Friends and Family: More than three times a week    Frequency of Social Gatherings with Friends and Family: More than three times a week    Attends Religious Services: More than 4 times per year    Active Member of [employer] or Organizations: Yes    Attends Banker Meetings: More than 4 times per year    Marital Status: Married  Catering manager Violence: Not At Risk (02/08/2024)   Humiliation, Afraid, Rape, and Kick questionnaire    Fear of Current or Ex-Partner: No    Emotionally Abused: No    Physically Abused: No    Sexually Abused: No     FAMILY HISTORY Family History  Problem Relation Age of Onset   Diabetes type II Mother    Breast cancer Mother    Leukemia Father    Diabetes type II Sister    Breast cancer Sister    Cancer Brother 73       throat 10/2020   Pancreatic cancer Maternal Aunt        x2   Diabetes Maternal Grandmother  Diabetes Maternal Grandfather    Colon cancer Neg Hx    Stomach cancer Neg Hx    Esophageal cancer Neg Hx        Review of Systems: 12 systems reviewed Otherwise as per HPI, all other systems reviewed and negative  Physical Exam: Vitals:   02/26/24 0629 02/26/24 0753  BP:  (!) 124/55  Pulse:    Resp: 18 20  Temp:  97.6 F (36.4 C)  SpO2:  96%   No intake/output data recorded.  Intake/Output Summary (Last 24 hours) at 02/26/2024 0914 Last data filed at 02/25/2024 2036 Gross per 24 hour  Intake 240 ml  Output 50 ml  Net 190 ml   General: well-appearing, no acute distress HEENT: anicteric sclera, oropharynx clear without lesions CV: Normal rate, no rub, 2+ pitting edema in the bilateral lower extremities Lungs: Mild increased work of breathing, decreased breath sounds in the bases Abd: soft, non-tender, non-distended Skin: no visible lesions or rashes Psych: alert, engaged, appropriate mood and affect Musculoskeletal: no obvious deformities Neuro: normal speech, no gross focal deficits   Test Results Reviewed Lab Results  Component Value Date   NA 123 (L) 02/26/2024   K 3.8 02/26/2024   CL 89 (L) 02/26/2024   CO2 23 02/26/2024   BUN 18 02/26/2024   CREATININE 1.25 (H) 02/26/2024   GFR 47.21 (L) 01/15/2020   CALCIUM  7.5 (L) 02/26/2024   ALBUMIN  3.8 02/07/2024   PHOS 2.4 (L) 02/13/2024    CBC Recent Labs  Lab 02/20/24 0541 02/22/24 0838 02/24/24 0425  WBC 15.4* 16.9* 12.9*  HGB 8.5* 8.8* 8.3*  HCT 25.6* 27.1* 25.4*  MCV 91.4 90.3 88.8  PLT 316 336 284    I have reviewed all relevant outside healthcare records related to the patient's current hospitalization

## 2024-02-26 NOTE — Progress Notes (Signed)
 Mobility Specialist Progress Note:   02/26/24 1600  Mobility  Activity Ambulated with assistance in hallway  Level of Assistance Minimal assist, patient does 75% or more  Assistive Device Front wheel walker  Distance Ambulated (ft) 80 ft  RUE Weight Bearing Per Provider Order NWB  LUE Weight Bearing Per Provider Order NWB  Activity Response Tolerated well  Mobility Referral Yes  Mobility visit 1 Mobility  Mobility Specialist Start Time (ACUTE ONLY) 1530  Mobility Specialist Stop Time (ACUTE ONLY) 1542  Mobility Specialist Time Calculation (min) (ACUTE ONLY) 12 min   Pt received in chair, agreeable to mobility. MinA to stand from chair. CG during ambulation. Standing rest break d/t slight SOB, pursed lip breathing encouraged. VSS. Pt displayed improved stability and gait steadiness compared to this morning. Declined any other discomfort, asx throughout. Pt returned to bed with call bell in reach and all needs met.   Sofia Dunn  Mobility Specialist Please contact via [employer] office at [phone]

## 2024-02-26 NOTE — Progress Notes (Signed)
 Most recent BMP reviewed Na at 120. Just received 2nd dose of lasix  today not too long ago. To receive urea later, will inc dose to 30g. Will give another dose of kcl 20meq. Continue to monitor serial sodium, can repeat dose of lasix  if stalled If worsening then would recommend hypertonic saline. Please call with any questions/concerns.  Cristi Donalds, MD [facility]

## 2024-02-26 NOTE — Progress Notes (Signed)
 Mobility Specialist Progress Note:   02/26/24 1148  Mobility  Activity Ambulated with assistance in hallway  Level of Assistance Minimal assist, patient does 75% or more  Assistive Device Front wheel walker  Distance Ambulated (ft) 80 ft  RUE Weight Bearing Per Provider Order NWB  LUE Weight Bearing Per Provider Order NWB  Activity Response Tolerated well  Mobility Referral Yes  Mobility visit 1 Mobility  Mobility Specialist Start Time (ACUTE ONLY) [id number]  Mobility Specialist Stop Time (ACUTE ONLY) 0953  Mobility Specialist Time Calculation (min) (ACUTE ONLY) 10 min   During Mobility: 81 HR ,  93% SpO2 RA  Pt received in bed, agreeable to mobility. Pt was able to stand within precautions with MinA. CG during ambulation. Pt c/o SOB and slight dizziness towards EOS. Verbal cues required to stay within RW, but no overt LOB present. VSS. Pt returned bed with call bell in reach and all needs met.   Sofia Dunn  Mobility Specialist Please contact via [employer] office at [phone]

## 2024-02-26 NOTE — Progress Notes (Signed)
   Patient Name: [name] Date of Encounter: 02/26/2024 [facility] Cardiologist: [name], MD   Interval Summary  .    Weak.  Felt short of breath traveling to the nursing station.  Vital Signs .    Vitals:   02/25/24 2256 02/26/24 0411 02/26/24 0629 02/26/24 0753  BP: (!) 97/55 (!) 96/48  (!) 124/55  Pulse: 75 80    Resp: 15 16 18 20   Temp: 97.9 F (36.6 C) 97.8 F (36.6 C)  97.6 F (36.4 C)  TempSrc: Oral Oral  Oral  SpO2: 95% 94%  96%  Weight:   74.6 kg   Height:        Intake/Output Summary (Last 24 hours) at 02/26/2024 0842 Last data filed at 02/25/2024 2036 Gross per 24 hour  Intake 240 ml  Output 50 ml  Net 190 ml      02/26/2024    6:29 AM 02/25/2024    5:00 AM 02/24/2024    5:43 AM  Last 3 Weights  Weight (lbs) 164 lb 7.4 oz 163 lb 5.8 oz 164 lb 0.4 oz  Weight (kg) 74.6 kg 74.1 kg 74.4 kg      Telemetry/ECG    NSR - Personally Reviewed  Physical Exam .   GEN: No acute distress.  Frail Neck: No JVD Cardiac: RRR, no murmurs, rubs, or gallops.  Respiratory: Clear to auscultation bilaterally. GI: Soft, nontender, non-distended  MS: minimal edema  Assessment & Plan .     83 year old female status post mitral valve repair, maze, LAA clipping, echo on 4/25 with EF of 35 to 40%  Hyponatremia - Appreciate nephrology consultation for assistance.  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  Continuing with loop diuretic for now.  Severe MR status post MVR - Trivial MR on echocardiogram postop.  Paroxysmal atrial fibrillation - Continue with amiodarone  loading.  Maintaining sinus rhythm.  Chronic anticoagulation - Eliquis  5 mg twice a day  Debility - Working with PT/OT.  Recommended home health PT.  Acute systolic heart failure, nonischemic cardiomyopathy - Previously on milrinone , off.  Last, 74%.  Torsemide  decreased to 40 mg once a day.  Digoxin  stopped.  On spironolactone  25 mg a day.   For  questions or updates, please contact [facility] Please consult [email or web address] for contact info under        Signed, Dorothye Gathers, MD

## 2024-02-26 NOTE — Plan of Care (Signed)

## 2024-02-26 NOTE — Progress Notes (Addendum)
 18 Days Post-Op Procedure(s) (LRB): MITRAL VALVE REPAIR USING A SIMULUS SEMI-RIGID ANNULOPLASTY BAND (N/A) MAZE PROCEDURE (N/A) CLIPPING, LEFT ATRIAL APPENDAGE USING A MEDTRONIC CLIP (N/A) ECHOCARDIOGRAM, TRANSESOPHAGEAL (N/A) Subjective: Still feels weak with ambulation  Objective: Vital signs in last 24 hours: Temp:  [97.6 F (36.4 C)-98.1 F (36.7 C)] 97.6 F (36.4 C) (04/27 0753) Pulse Rate:  [75-80] 80 (04/27 0411) Cardiac Rhythm: Normal sinus rhythm (04/26 2100) Resp:  [15-20] 20 (04/27 0753) BP: (96-125)/(48-64) 124/55 (04/27 0753) SpO2:  [94 %-99 %] 96 % (04/27 0753) Weight:  [74.6 kg] 74.6 kg (04/27 0629)  Hemodynamic parameters for last 24 hours: CVP:  [7 mmHg-14 mmHg] 13 mmHg  Intake/Output from previous day: 04/26 0701 - 04/27 0700 In: 240 [P.O.:240] Out: 50 [Urine:50] Intake/Output this shift: No intake/output data recorded.  General appearance: alert, cooperative, and no distress Heart: regular rate and rhythm Lungs: dim in bases Abdomen: benign Extremities: minor edema Wound: incis healing well  Lab Results: Recent Labs    02/24/24 0425  WBC 12.9*  HGB 8.3*  HCT 25.4*  PLT 284   BMET:  Recent Labs    02/25/24 0436 02/26/24 0430  NA 126* 123*  K 4.0 3.8  CL 89* 89*  CO2 26 23  GLUCOSE 112* 99  BUN 19 18  CREATININE 1.40* 1.25*  CALCIUM  8.0* 7.5*    PT/INR: No results for input(s): "LABPROT", "INR" in the last 72 hours. ABG    Component Value Date/Time   PHART 7.367 02/08/2024 1849   HCO3 23.9 02/08/2024 1849   TCO2 25 02/08/2024 1849   ACIDBASEDEF 2.0 02/08/2024 1849   O2SAT 68.3 02/26/2024 0455   CBG (last 3)  No results for input(s): "GLUCAP" in the last 72 hours.  Meds Scheduled Meds:  amiodarone   400 mg Oral BID   apixaban   5 mg Oral BID   atorvastatin   10 mg Oral Daily   Chlorhexidine  Gluconate Cloth  6 each Topical Daily   [place] [facility], Patient & Family Education   Does not apply Once    ezetimibe   10 mg Oral Daily   feeding supplement  237 mL Oral BID BM   losartan   12.5 mg Oral Daily   magnesium  gluconate  500 mg Oral Daily   melatonin  3 mg Oral QHS   multivitamin with minerals  1 tablet Oral Daily   pantoprazole   40 mg Oral Daily   potassium chloride   20 mEq Oral Daily   sodium chloride  flush  3-10 mL Intravenous Q12H   spironolactone   25 mg Oral Daily   torsemide   40 mg Oral Daily   Continuous Infusions:  promethazine  (PHENERGAN ) injection (IM or IVPB) Stopped (02/20/24 0000)   PRN Meds:.loperamide , ondansetron  (ZOFRAN ) IV, mouth rinse, oxyCODONE , phenol, polyethylene glycol, promethazine  (PHENERGAN ) injection (IM or IVPB), sodium chloride  flush, traMADol , witch hazel-glycerin   Xrays No results found.  Assessment/Plan: S/P Procedure(s) (LRB): MITRAL VALVE REPAIR USING A SIMULUS SEMI-RIGID ANNULOPLASTY BAND (N/A) MAZE PROCEDURE (N/A) CLIPPING, LEFT ATRIAL APPENDAGE USING A MEDTRONIC CLIP (N/A) ECHOCARDIOGRAM, TRANSESOPHAGEAL (N/A) POD#18  1 afeb, VSS, sinus rhythm 2 sats good on RA 3 weight remains stable 4 Co-ox 68 5 sodium 123 which is fairly stable for several days 6 she is concerned she is not walking well enough to go home yet - walked to nursing station and felt SOB- will check CXR to see if has effusions 7 d/w cardiology- they will call nephrology for input on hyponatremia 8 push pulm  hygiene and rehab    LOS: 18 days    [name] Revering PA-C Pager [phone] 02/26/2024  Agree with above Continue PT Dispo planning  Mouhamadou Gittleman O Olevia Westervelt

## 2024-02-27 ENCOUNTER — Inpatient Hospital Stay (HOSPITAL_COMMUNITY)

## 2024-02-27 HISTORY — PX: IR THORACENTESIS ASP PLEURAL SPACE W/IMG GUIDE: IMG5380

## 2024-02-27 LAB — BASIC METABOLIC PANEL WITH GFR
Anion gap: 12 (ref 5–15)
Anion gap: 11 (ref 5–15)
BUN: 52 mg/dL — ABNORMAL HIGH (ref 8–23)
BUN: 46 mg/dL — ABNORMAL HIGH (ref 8–23)
CO2: 26 mmol/L (ref 22–32)
CO2: 26 mmol/L (ref 22–32)
Calcium: 8.5 mg/dL — ABNORMAL LOW (ref 8.9–10.3)
Calcium: 8.2 mg/dL — ABNORMAL LOW (ref 8.9–10.3)
Chloride: 85 mmol/L — ABNORMAL LOW (ref 98–111)
Chloride: 84 mmol/L — ABNORMAL LOW (ref 98–111)
Creatinine, Ser: 1.23 mg/dL — ABNORMAL HIGH (ref 0.44–1.00)
Creatinine, Ser: 1.35 mg/dL — ABNORMAL HIGH (ref 0.44–1.00)
GFR, Estimated: 44 mL/min — ABNORMAL LOW (ref >60)
GFR, Estimated: 40 mL/min — ABNORMAL LOW (ref >60)
Glucose, Bld: 118 mg/dL — ABNORMAL HIGH (ref 70–99)
Glucose, Bld: 123 mg/dL — ABNORMAL HIGH (ref 70–99)
Potassium: 4.1 mmol/L (ref 3.5–5.1)
Potassium: 3.7 mmol/L (ref 3.5–5.1)
Sodium: 123 mmol/L — ABNORMAL LOW (ref 135–145)
Sodium: 121 mmol/L — ABNORMAL LOW (ref 135–145)

## 2024-02-27 LAB — COOXEMETRY PANEL
Carboxyhemoglobin: 2.3 % — ABNORMAL HIGH (ref 0.5–1.5)
Methemoglobin: <0.7 % (ref 0.0–1.5)
O2 Saturation: 73.1 %
Total hemoglobin: 8.8 g/dL — ABNORMAL LOW (ref 12.0–16.0)

## 2024-02-27 LAB — MAGNESIUM: Magnesium: 1.8 mg/dL (ref 1.7–2.4)

## 2024-02-27 MED ORDER — TRAZODONE HCL 50 MG PO TABS
25.0000 mg | ORAL_TABLET | Freq: Once | ORAL | Status: AC
Start: 2024-02-27 — End: 2024-02-27
  Administered 2024-02-27: 25 mg via ORAL
  Filled 2024-02-27: qty 1

## 2024-02-27 MED ORDER — ACETAZOLAMIDE 250 MG PO TABS
500.0000 mg | ORAL_TABLET | Freq: Once | ORAL | Status: AC
  Administered 2024-02-27: 500 mg via ORAL
  Filled 2024-02-27: qty 2

## 2024-02-27 MED ORDER — LIDOCAINE HCL 1 % IJ SOLN
INTRAMUSCULAR | Status: AC
Start: 2024-02-27 — End: ?
  Filled 2024-02-27: qty 20

## 2024-02-27 MED ORDER — MAGNESIUM SULFATE 2 GM/50ML IV SOLN
2.0000 g | Freq: Once | INTRAVENOUS | Status: AC
  Administered 2024-02-27: 2 g via INTRAVENOUS
  Filled 2024-02-27: qty 50

## 2024-02-27 MED ORDER — FUROSEMIDE 10 MG/ML IJ SOLN
80.0000 mg | Freq: Two times a day (BID) | INTRAMUSCULAR | Status: DC
  Administered 2024-02-27: 80 mg via INTRAVENOUS
  Filled 2024-02-27: qty 8

## 2024-02-27 MED ORDER — LIDOCAINE HCL 1 % IJ SOLN
20.0000 mL | Freq: Once | INTRAMUSCULAR | Status: AC
Start: 2024-02-27 — End: 2024-02-27
  Administered 2024-02-27: 10 mL

## 2024-02-27 MED ORDER — FUROSEMIDE 10 MG/ML IJ SOLN
160.0000 mg | Freq: Once | INTRAVENOUS | Status: AC
  Administered 2024-02-27: 160 mg via INTRAVENOUS
  Filled 2024-02-27: qty 10

## 2024-02-27 NOTE — Progress Notes (Signed)
 [name] is an 83 y.o. female with a past medical history HTN and PAF who presents with shortness of breath with severe mitral regurgitation status post mitral valve replacement, MAZE, and LAA clipping.  Course complicated by systolic heart failure.  We were consulted for hyponatremia.   Assessment/Plan: Hypervolemic hyponatremia: Worsening and persistent.  No obvious symptoms.  Poor appetite for months now is certainly a reset osmostat could be playing a factor as well.  Likely multifactorial but certainly volume excess. - IV Lasix  80 mg 2 times Sunday and given shortness of breath, 2+ edema now with Unna boots, slow improvement in sodium we will give another 2 doses today.  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 Continue Ure-Na   Hold on hypertonic for now as long as Na trends in right direction.   Nonoliguric AKI: Baseline creatinine normal rate response to Lasix .  Creatinine has been elevated for most of the hospitalization but elevation has been fairly mild.  Likely some degree of ischemic injury during surgery.  Cholesterol embolic injury is always possible.  Also may just have lower GFR in the setting of her heart failure with new cardiorenal syndrome.  Will continue to monitor with diuresis.  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  Followed by heart failure team previously.  Continue with diuresis as above.  Continue with goal-directed medical therapy which has already been initiated.  Monitor low blood pressures closely. Started SGLT2i which has efficacy in CHF but also in hyponatremia mgmt   Atrial fibrillation with RVR: Management per primary team    Hypokalemia: On spironolactone .  Supplement as needed  Subjective: Complains of difficulty breathing, intermittent cough but denies any nausea, headaches, severe pain.  Mild chest discomfort    Chemistry and CBC: Creatinine, Ser  Date/Time Value Ref Range Status  02/26/2024 10:00 PM 1.26 (H) 0.44 - 1.00 mg/dL Final  65/78/4696 29:52 PM 1.37 (H) 0.44 - 1.00 mg/dL Final  84/13/2440 10:27 AM 1.35 (H) 0.44 - 1.00 mg/dL Final  25/36/6440 34:74 AM 1.25 (H) 0.44 - 1.00 mg/dL Final  25/95/6387 56:43 AM 1.40 (H) 0.44 - 1.00 mg/dL Final  32/95/1884 16:60 AM 1.33 (H) 0.44 - 1.00 mg/dL Final  63/11/6008 93:23 AM 1.28 (H) 0.44 - 1.00 mg/dL Final  55/73/2202 54:27 PM 1.37 (H) 0.44 - 1.00 mg/dL Final  04/24/7627 31:51 AM 1.44 (H) 0.44 - 1.00 mg/dL Final  76/16/0737 10:62 PM 1.48 (H) 0.44 - 1.00 mg/dL Final  69/48/5462 70:35 AM 1.47 (H) 0.44 - 1.00 mg/dL Final  00/93/8182 99:37 PM 1.41 (H) 0.44 - 1.00 mg/dL Final  16/96/7893 81:01 AM 1.39 (H) 0.44 - 1.00 mg/dL Final  75/08/2584 27:78 PM 1.31 (H) 0.44 - 1.00 mg/dL Final  24/23/5361 44:31 AM 1.41 (H) 0.44 - 1.00 mg/dL Final  54/00/8676 19:50 PM 1.49 (H) 0.44 - 1.00 mg/dL Final  93/26/7124 58:09 AM 1.46 (H) 0.44 - 1.00 mg/dL Final  98/33/8250 53:97 PM 1.54 (H) 0.44 - 1.00 mg/dL Final  67/34/1937 90:24 AM 1.48 (H) 0.44 - 1.00 mg/dL Final  09/73/5329 92:42 AM 1.34 (H) 0.44 - 1.00 mg/dL Final  68/34/1962 22:97 PM 1.28 (H) 0.44 - 1.00 mg/dL Final  98/92/1194 17:40 AM 1.29 (H)  0.44 - 1.00 mg/dL Final  40/98/1191 47:82 AM 1.15 (H) 0.44 - 1.00 mg/dL Final  95/62/1308 65:78 AM 1.13 (H) 0.44 - 1.00 mg/dL Final  46/96/2952 84:13 AM 1.22 (H) 0.44 - 1.00 mg/dL Final  24/40/1027 25:36 AM 1.18 (H) 0.44 - 1.00 mg/dL Final  64/40/3474 25:95 AM 1.17 (H) 0.44 - 1.00 mg/dL Final  63/87/5643 32:95 PM 1.27 (H) 0.44 - 1.00 mg/dL Final  18/84/1660 63:01 AM 0.91 0.44 - 1.00 mg/dL Final  60/08/9322 55:73 PM 0.86 0.44 - 1.00 mg/dL Final  22/12/5425 06:23 AM 0.90 0.44 -  1.00 mg/dL Final  76/28/3151 76:16 AM 0.90 0.44 - 1.00 mg/dL Final  07/37/1062 69:48 AM 0.80 0.44 - 1.00 mg/dL Final  54/62/7035 00:93 AM 0.90 0.44 - 1.00 mg/dL Final  81/82/9937 16:96 AM 1.00 0.44 - 1.00 mg/dL Final  78/93/8101 75:10 AM 1.15 (H) 0.44 - 1.00 mg/dL Final  25/85/2778 24:23 AM 0.81 0.44 - 1.00 mg/dL Final  53/61/4431 54:00 PM 0.76 0.44 - 1.00 mg/dL Final  86/76/1950 93:26 PM 0.95 0.57 - 1.00 mg/dL Final  71/24/5809 98:33 AM 1.13 (H) 0.57 - 1.00 mg/dL Final  82/50/5397 67:34 AM 0.88 0.44 - 1.00 mg/dL Final  19/37/9024 09:73 AM 0.88 0.44 - 1.00 mg/dL Final  53/29/9242 68:34 PM 1.12 0.40 - 1.20 mg/dL Final  19/62/2297 98:92 AM 0.73 0.50 - 1.10 mg/dL Final  11/94/1740 81:44 AM 0.78 0.50 - 1.10 mg/dL Final  81/85/6314 97:02 PM 0.83 0.50 - 1.10 mg/dL Final  63/78/5885 02:77 AM 0.85 0.50 - 1.10 mg/dL Final  41/28/7867 67:20 AM 0.87  Final   Recent Labs  Lab 02/23/24 0430 02/24/24 0425 02/25/24 0436 02/26/24 0430 02/26/24 1110 02/26/24 1600 02/26/24 2200  NA 124* 125* 126* 123* 120* 120* 122*  K 3.7 3.4* 4.0 3.8 3.9 3.8 4.2  CL 87* 88* 89* 89* 84* 83* 86*  CO2 26 26 26 23 24 24 25   GLUCOSE 106* 96 112* 99 113* 134* 119*  BUN 20 20 19 18 18  27* 32*  CREATININE 1.28* 1.33* 1.40* 1.25* 1.35* 1.37* 1.26*  CALCIUM  8.0* 7.9* 8.0* 7.5* 8.2* 8.0* 8.1*   Recent Labs  Lab 02/22/24 0838 02/24/24 0425  WBC 16.9* 12.9*  HGB 8.8* 8.3*  HCT 27.1* 25.4*  MCV 90.3 88.8  PLT 336 284   Liver Function Tests: No results for input(s): "AST", "ALT", "ALKPHOS", "BILITOT", "PROT", "ALBUMIN " in the last 168 hours. No results for input(s): "LIPASE", "AMYLASE" in the last 168 hours. No results for input(s): "AMMONIA" in the last 168 hours. Cardiac Enzymes: No results for input(s): "CKTOTAL", "CKMB", "CKMBINDEX", "TROPONINI" in the last 168 hours. Iron  Studies: No results for input(s): "IRON ", "TIBC", "TRANSFERRIN", "FERRITIN" in the last 72  hours. PT/INR: @LABRCNTIP (inr:5)  Xrays/Other Studies: ) Results for orders placed or performed during the hospital encounter of 02/08/24 (from the past 48 hours)  Magnesium      Status: Abnormal   Collection Time: 02/26/24  4:30 AM  Result Value Ref Range   Magnesium  1.6 (L) 1.7 - 2.4 mg/dL    Comment: Performed at [facility], [address] [place], [place] [address]  Basic metabolic panel     Status: Abnormal   Collection Time: 02/26/24  4:30 AM  Result Value Ref Range   Sodium 123 (L) 135 - 145 mmol/L   Potassium 3.8 3.5 - 5.1 mmol/L   Chloride 89 (L) 98 - 111 mmol/L   CO2 23 22 - 32 mmol/L   Glucose, Bld 99 70 - 99  mg/dL    Comment: Glucose reference range applies only to samples taken after fasting for at least 8 hours.   BUN 18 8 - 23 mg/dL   Creatinine, Ser 2.95 (H) 0.44 - 1.00 mg/dL   Calcium  7.5 (L) 8.9 - 10.3 mg/dL   GFR, Estimated 44 (L) >60 mL/min    Comment: (NOTE) Calculated using the CKD-EPI Creatinine Equation (2021)    Anion gap 11 5 - 15    Comment: Performed at [facility], [address], [place], [place] [address]  Cooxemetry Panel (carboxy, met, total hgb, O2 sat)     Status: Abnormal   Collection Time: 02/26/24  4:55 AM  Result Value Ref Range   Total hemoglobin 8.9 (L) 12.0 - 16.0 g/dL   O2 Saturation 86.5 %   Carboxyhemoglobin 2.2 (H) 0.5 - 1.5 %   Methemoglobin <0.7 0.0 - 1.5 %    Comment: Performed at [facility], [address] [place], [place] [address]  Basic metabolic panel     Status: Abnormal   Collection Time: 02/26/24 11:10 AM  Result Value Ref Range   Sodium 120 (L) 135 - 145 mmol/L   Potassium 3.9 3.5 - 5.1 mmol/L   Chloride 84 (L) 98 - 111 mmol/L   CO2 24 22 - 32 mmol/L   Glucose, Bld 113 (H) 70 - 99 mg/dL    Comment: Glucose reference range applies only to samples taken after fasting for at least 8 hours.   BUN 18 8 - 23 mg/dL   Creatinine, Ser 6.29 (H) 0.44 - 1.00 mg/dL   Calcium  8.2 (L) 8.9 - 10.3 mg/dL    GFR, Estimated 40 (L) >60 mL/min    Comment: (NOTE) Calculated using the CKD-EPI Creatinine Equation (2021)    Anion gap 12 5 - 15    Comment: Performed at [facility], [address], [place], [place] [address]  Osmolality, urine     Status: Abnormal   Collection Time: 02/26/24 11:30 AM  Result Value Ref Range   Osmolality, Ur 275 (L) 300 - 900 mOsm/kg    Comment: Performed at [facility], [address] [place], [place] [address]  Sodium, urine, random     Status: None   Collection Time: 02/26/24 11:30 AM  Result Value Ref Range   Sodium, Ur 46 mmol/L    Comment: Performed at [facility], [address], [place], [place] [address]  Basic metabolic panel     Status: Abnormal   Collection Time: 02/26/24  4:00 PM  Result Value Ref Range   Sodium 120 (L) 135 - 145 mmol/L   Potassium 3.8 3.5 - 5.1 mmol/L   Chloride 83 (L) 98 - 111 mmol/L   CO2 24 22 - 32 mmol/L   Glucose, Bld 134 (H) 70 - 99 mg/dL    Comment: Glucose reference range applies only to samples taken after fasting for at least 8 hours.   BUN 27 (H) 8 - 23 mg/dL   Creatinine, Ser 5.36 (H) 0.44 - 1.00 mg/dL   Calcium  8.0 (L) 8.9 - 10.3 mg/dL   GFR, Estimated 39 (L) >60 mL/min    Comment: (NOTE) Calculated using the CKD-EPI Creatinine Equation (2021)    Anion gap 13 5 - 15    Comment: Performed at [facility], [address], [place], [place] [address]  Basic metabolic panel     Status: Abnormal   Collection Time: 02/26/24 10:00 PM  Result Value Ref Range   Sodium 122 (L) 135 - 145 mmol/L   Potassium 4.2 3.5 - 5.1 mmol/L   Chloride 86 (L) 98 - 111 mmol/L   CO2 25 22 - 32 mmol/L   Glucose, Bld 119 (H) 70 - 99 mg/dL    Comment: Glucose reference range applies only to samples taken after fasting for at least 8 hours.   BUN 32 (H) 8 - 23 mg/dL   Creatinine, Ser 6.57 (H) 0.44 - 1.00 mg/dL   Calcium  8.1 (L) 8.9 - 10.3 mg/dL   GFR, Estimated 43 (L) >60 mL/min    Comment: (NOTE) Calculated  using the CKD-EPI Creatinine Equation (2021)    Anion gap 11 5 - 15    Comment: Performed at [facility], [address] [place], [place] [address]  Cooxemetry Panel (carboxy, met, total hgb, O2 sat)     Status: Abnormal   Collection Time: 02/27/24  5:50 AM  Result Value Ref Range   Total hemoglobin 8.8 (L) 12.0 - 16.0 g/dL   O2 Saturation 29.5 %   Carboxyhemoglobin 2.3 (H) 0.5 - 1.5 %   Methemoglobin <0.7 0.0 - 1.5 %    Comment: Performed at [facility], [address], [place], [place] [address]   DG Chest 2 View Result Date: 02/26/2024 CLINICAL DATA:  Follow-up pleural effusion EXAM: CHEST - 2 VIEW COMPARISON:  02/22/2024 FINDINGS: Right-sided PICC is again noted and stable. Cardiac shadow is mildly prominent accentuated by the portable technique. Postsurgical changes are again seen. Increasing consolidation in the left base is noted with associated small effusion. Stable blunting of the right costophrenic angle is noted as well. IMPRESSION: Increasing left basilar consolidation with effusion. Electronically Signed   By: Violeta Grey M.D.   On: 02/26/2024 11:05    PMH:   Past Medical History:  Diagnosis Date   Acid reflux    Anemia    Arthritis    Atrial fibrillation (HCC)    Cataract    Diverticulosis of colon    Endometrial cancer (HCC)    1A grade endometrial ca   Essential hypertension 01/24/2009   Qualifier: Diagnosis of  By: Nelson-Smith CMA (AAMA), Dottie     Hx of radiation therapy 04/24/08-05/29/08& 8/12/,8/19,&06/27/08   external beam  through 7/09 ,then intracavity in 06/2008   Hypertension    Mitral valve prolapse    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   Tricuspid regurgitation     PSH:   Past Surgical History:  Procedure Laterality Date   APPENDECTOMY     1967   arthroscopic knee right surgery     CLIPPING OF ATRIAL APPENDAGE N/A 02/08/2024   Procedure: CLIPPING, LEFT ATRIAL APPENDAGE USING A MEDTRONIC CLIP;  Surgeon: Melene Sportsman, MD;   Location: [facility] OR;  Service: Open Heart Surgery;  Laterality: N/A;   ECTOPIC PREGNANCY SURGERY  1967   iron  infusion  12/2022   LAPAROSCOPIC HYSTERECTOMY     total   MAZE N/A 02/08/2024   Procedure: MAZE PROCEDURE;  Surgeon: Melene Sportsman, MD;  Location: [facility] OR;  Service: Open Heart Surgery;  Laterality: N/A;   MITRAL VALVE REPAIR N/A 02/08/2024   Procedure: MITRAL VALVE REPAIR USING A SIMULUS SEMI-RIGID ANNULOPLASTY BAND;  Surgeon: Melene Sportsman, MD;  Location: [facility] OR;  Service: Open Heart Surgery;  Laterality: N/A;  possible replacement   ORTHOPEDIC SURGERY     Orthoscopic Knee Surgery   RIGHT/LEFT HEART CATH AND CORONARY ANGIOGRAPHY N/A 12/30/2023  Procedure: RIGHT/LEFT HEART CATH AND CORONARY ANGIOGRAPHY;  Surgeon: [name], MD;  Location: [facility] INVASIVE CV LAB;  Service: Cardiovascular;  Laterality: N/A;   TEAR DUCT PROBING     TEE WITHOUT CARDIOVERSION N/A 02/08/2024   Procedure: ECHOCARDIOGRAM, TRANSESOPHAGEAL;  Surgeon: Melene Sportsman, MD;  Location: [facility] OR;  Service: Open Heart Surgery;  Laterality: N/A;   TOOTH EXTRACTION     bone graft   TRANSESOPHAGEAL ECHOCARDIOGRAM (CATH LAB) N/A 12/30/2023   Procedure: TRANSESOPHAGEAL ECHOCARDIOGRAM;  Surgeon: Euell Herrlich, MD;  Location: [facility] INVASIVE CV LAB;  Service: Cardiovascular;  Laterality: N/A;    Allergies:  Allergies  Allergen Reactions   Crestor [Rosuvastatin] Other (See Comments)    Leg cramps   Glycopyrrolate      Other reaction(s): extreme dry mouth   Codeine Other (See Comments)    Hyper    Medications:   Prior to Admission medications   Medication Sig Start Date End Date Taking? Authorizing Provider  amiodarone  (PACERONE ) 200 MG tablet Take 1 tablet (200 mg total) by mouth daily. 01/13/24  Yes Meng, Hao, PA  apixaban  (ELIQUIS ) 5 MG TABS tablet Take 1 tablet (5 mg total) by mouth 2 (two) times daily. 01/26/24  Yes [name], MD  b complex vitamins tablet Take 1 tablet by mouth daily.     Yes [provider]   Bempedoic Acid -Ezetimibe  (NEXLIZET) 180-10 MG TABS Take 1 tablet by mouth daily.   Yes [provider]  CALCIUM  PO Take 650 mg by mouth daily.   Yes [provider]  Cholecalciferol (VITAMIN D3) 125 MCG (5000 UT) CAPS Take 5,000 Units by mouth daily.   Yes [provider]  denosumab  (PROLIA ) 60 MG/ML SOSY injection Inject 60 mg into the skin every 6 (six) months.   Yes [provider]  furosemide  (LASIX ) 20 MG tablet Take 1 tablet (20 mg total) by mouth daily. 01/12/24 01/11/25 Yes Melene Sportsman, MD  Garlic 1000 MG CAPS Take 1,000 mg by mouth daily.   Yes [provider]  Joneen Nelson Omega-3 300 MG CAPS Take 300 mg by mouth daily.   Yes [provider]  loperamide  (IMODIUM ) 2 MG capsule Take 1 capsule (2 mg total) by mouth 4 (four) times daily as needed for diarrhea or loose stools. 01/10/21  Yes Trish Furl, MD  metoprolol  succinate (TOPROL -XL) 25 MG 24 hr tablet Take 25 mg by mouth 2 (two) times daily.  Take additional tablet for palpitations   Yes [provider]  Multiple Vitamins-Minerals (MULTIVITAMINS THER. W/MINERALS) TABS Take 1 tablet by mouth daily.     Yes [provider]  pantoprazole  (PROTONIX ) 40 MG tablet Take 40 mg by mouth daily.   Yes [provider]  potassium chloride  SA (KLOR-CON  M) 20 MEQ tablet Take 0.5 tablets (10 mEq total) by mouth daily. 01/12/24  Yes Melene Sportsman, MD  Zinc 50 MG TABS Take 50 mg by mouth daily.   Yes [provider]  apixaban  (ELIQUIS ) 5 MG TABS tablet Take 1 tablet (5 mg total) by mouth 2 (two) times daily. 01/31/24   [name], MD  atorvastatin  (LIPITOR) 10 MG tablet Take 1 tablet (10 mg total) by mouth daily. Patient not taking: Reported on 01/25/2024 12/20/23 03/19/24  [name], MD  ezetimibe  (ZETIA ) 10 MG tablet Take 1 tablet (10 mg total) by mouth daily. Patient not taking: Reported on 01/25/2024 12/20/23 03/19/24  [name], MD    Discontinued Meds:   Medications  Discontinued During This Encounter  Medication Reason   EPINEPHrine  (ADRENALIN ) 5 mg in NS 250 mL (0.02 mg/mL) premix infusion    milrinone  (PRIMACOR ) 20 MG/100 ML (0.2 mg/mL) infusion    nitroGLYCERIN  50 mg in dextrose  5 % 250 mL (0.2 mg/mL) infusion    norepinephrine  (LEVOPHED ) 4mg  in (0.016 mg/mL) premix infusion    potassium chloride  injection 80 mEq    heparin  30,000 units/NS 1000 mL solution for CELLSAVER    heparin  sodium (porcine) 2,500 Units, papaverine 30 mg in electrolyte-A (PLASMALYTE-A PH 7.4) 500 mL irrigation    tranexamic acid  (CYKLOKAPRON ) pump prime solution 137 mg    vancomycin  (VANCOCIN ) 1,000 mg in sodium chloride  0.9 % 1,000 mL irrigation    [facility] Blood Cardioplegia vial (lidocaine /magnesium /mannitol  0.26g-4g-6.4g)    [facility] Blood Cardioplegia vial (lidocaine /magnesium /mannitol  0.26g-4g-6.4g)    [facility]    metoprolol  tartrate (LOPRESSOR ) tablet 12.5 mg    chlorhexidine  (HIBICLENS ) 4 % liquid 2 Application    0.9 % irrigation (POUR BTL)    vancomycin  (VANCOCIN ) 1,000 mg in sodium chloride  0.9 % 1,000 mL irrigation    Surgifoam 1 Gm with 0.9% sodium chloride  (4 ml) topical solution Patient Discharge   Bempedoic Acid -Ezetimibe  180-10 MG TABS 1 tablet Not available   sodium chloride  flush (NS) 0.9 % injection 3-10 mL    sodium chloride  flush (NS) 0.9 % injection 3-10 mL    sodium chloride  flush (NS) 0.9 % injection 3-10 mL    sodium chloride  flush (NS) 0.9 % injection 3-10 mL    sodium chloride  flush (NS) 0.9 % injection 3-10 mL    sodium chloride  flush (NS) 0.9 % injection 3-10 mL    acetaminophen  (TYLENOL ) 160 MG/5ML solution 1,000 mg Change in therapy   aspirin  chewable tablet 324 mg Change in therapy   metoprolol  tartrate (LOPRESSOR ) 25 mg/10 mL oral suspension 12.5 mg Change in therapy   0.45 % sodium chloride  infusion Change in therapy   acetaminophen  (TYLENOL ) 160 MG/5ML solution 650 mg    Oral care  mouth rinse    Oral care mouth rinse    midazolam  (VERSED ) injection 2 mg    dexmedetomidine  (PRECEDEX ) 400 MCG/100ML (4 mcg/mL) infusion    nitroGLYCERIN  50 mg in dextrose  5 % 250 mL (0.2 mg/mL) infusion    amiodarone  (PACERONE ) tablet 400 mg    EPINEPHrine  (ADRENALIN ) 5 mg in NS 250 mL (0.02 mg/mL) premix infusion    potassium chloride  SA (KLOR-CON  M) CR tablet 20 mEq    amiodarone  (NEXTERONE ) 1.5 mg/mL IV bolus only 150 mg Entry Error   insulin  regular, human (MYXREDLIN ) 100 units/ 100 mL infusion    enoxaparin  (LOVENOX ) injection 30 mg    phenylephrine  (NEO-SYNEPHRINE) 20mg /NS premix infusion    metoprolol  tartrate (LOPRESSOR ) tablet 12.5 mg    aspirin  EC tablet 325 mg    vasopressin  (PITRESSIN) 20 Units in 100 mL (0.2 unit/mL) infusion-*FOR SHOCK*    amiodarone  (NEXTERONE  PREMIX) 360-4.14 MG/200ML-% (1.8 mg/mL) IV infusion    norepinephrine  (LEVOPHED ) 16 mg in (0.064 mg/mL) premix infusion    heparin  ADULT infusion 100 units/mL (25000 units/250mL)    amiodarone  (NEXTERONE ) 1.5 mg/mL IV bolus only 150 mg    apixaban  (ELIQUIS ) tablet 2.5 mg    amiodarone  (NEXTERONE ) 1.8 mg/mL load via infusion 150 mg    scopolamine  (TRANSDERM-SCOP) 1 MG/3DAYS 1.5 mg    amiodarone  (PACERONE ) tablet 400 mg Duplicate   furosemide  (LASIX ) injection 40 mg    amiodarone  (PACERONE ) tablet 400 mg  amiodarone  (NEXTERONE ) 1.5 mg/mL IV bolus only 150 mg    potassium chloride  10 mEq in 50 mL *CENTRAL LINE* IVPB    docusate sodium  (COLACE) capsule 200 mg    bisacodyl  (DULCOLAX) EC tablet 10 mg    bisacodyl  (DULCOLAX) suppository 10 mg    amiodarone  (NEXTERONE ) 1.5 mg/mL IV bolus only 150 mg    senna (SENOKOT) tablet 8.6 mg    potassium chloride  SA (KLOR-CON  M) CR tablet 40 mEq    ondansetron  (ZOFRAN ) injection 4 mg    digoxin  (LANOXIN ) 0.1 MG/ML injection 0.5 mg Change in therapy   digoxin  (LANOXIN ) 0.1 MG/ML injection 0.25 mg Change in therapy   bethanechol  (URECHOLINE ) tablet 5 mg  Discontinued by provider   potassium chloride  (KLOR-CON ) packet 40 mEq    potassium chloride  10 mEq in 50 mL *CENTRAL LINE* IVPB    loperamide  HCl (IMODIUM ) 1 MG/7.5ML suspension 2 mg    potassium chloride  SA (KLOR-CON  M) CR tablet 40 mEq    potassium chloride  (KLOR-CON ) packet 40 mEq    potassium chloride  (KLOR-CON ) packet 40 mEq Duplicate   aspirin  chewable tablet 81 mg    traMADol  (ULTRAM ) tablet 50 mg    sodium chloride  flush (NS) 0.9 % injection 3 mL    sodium chloride  flush (NS) 0.9 % injection 10-40 mL    fentaNYL  (SUBLIMAZE ) injection 100 mcg    sodium chloride  flush (NS) 0.9 % injection 3-10 mL    sodium chloride  flush (NS) 0.9 % injection 10-40 mL    potassium chloride  SA (KLOR-CON  M) CR tablet 40 mEq    potassium chloride  (KLOR-CON ) packet 20 mEq    potassium chloride  10 mEq in 50 mL *CENTRAL LINE* IVPB    metoprolol  tartrate (LOPRESSOR ) injection 2.5-5 mg    digoxin  (LANOXIN ) tablet 0.125 mg    digoxin  (LANOXIN ) tablet 0.0625 mg    furosemide  (LASIX ) 200 mg in dextrose  5 % 100 mL (2 mg/mL) infusion    potassium chloride  SA (KLOR-CON  M) CR tablet 40 mEq    potassium chloride  SA (KLOR-CON  M) CR tablet 40 mEq    aspirin  EC tablet 81 mg    milrinone  (PRIMACOR ) 20 MG/100 ML (0.2 mg/mL) infusion    traMADol  (ULTRAM ) tablet 50 mg    amiodarone  (NEXTERONE  PREMIX) 360-4.14 MG/200ML-% (1.8 mg/mL) IV infusion    morphine  (PF) 2 MG/ML injection 1-4 mg    potassium chloride  SA (KLOR-CON  M) CR tablet 20 mEq    potassium chloride  (KLOR-CON  M) CR tablet 30 mEq    spironolactone  (ALDACTONE ) tablet 12.5 mg    potassium chloride  (KLOR-CON  M) CR tablet 30 mEq    amiodarone  (PACERONE ) tablet 400 mg    amiodarone  (PACERONE ) tablet 200 mg    dextrose  50 % solution 0-50 mL    insulin  aspart (novoLOG ) injection 2-6 Units    feeding supplement (BOOST / RESOURCE BREEZE) liquid 1 Container    sodium chloride  flush (NS) 0.9 % injection 3 mL    sodium chloride  flush (NS) 0.9 % injection 3 mL     0.9 %  sodium chloride  infusion    amiodarone  (PACERONE ) tablet 200 mg    digoxin  (LANOXIN ) tablet 0.0625 mg    torsemide  (DEMADEX ) tablet 40 mg    potassium chloride  SA (KLOR-CON  M) CR tablet 40 mEq    torsemide  (DEMADEX ) tablet 40 mg    urea (URE-NA) oral packet 15 g     Social History:  reports that she has never smoked. She has never used smokeless tobacco.  She reports current alcohol use. She reports that she does not use drugs.  Family History:   Family History  Problem Relation Age of Onset   Diabetes type II Mother    Breast cancer Mother    Leukemia Father    Diabetes type II Sister    Breast cancer Sister    Cancer Brother 66       throat 10/2020   Pancreatic cancer Maternal Aunt        x2   Diabetes Maternal Grandmother    Diabetes Maternal Grandfather    Colon cancer Neg Hx    Stomach cancer Neg Hx    Esophageal cancer Neg Hx     Blood pressure (!) 107/96, pulse 94, temperature 97.8 F (36.6 C), temperature source Oral, resp. rate 20, height 5\' 4"  (1.626 m), weight 80 kg, SpO2 94%. Physical Exam: General: NAD HEENT: anicteric sclera CV: Normal rate, no rub, 2+ pitting edema in the bilateral lower extremities, UNNA boots Lungs: Mild increased work of breathing, trace rales Abd: SNDNT +BS Skin: no visible lesions or rashes Musculoskeletal: no obvious deformities Neuro: normal speech, no gross focal deficits      Renesmae Donahey, Alveda Aures, MD 02/27/2024, 7:24 AM

## 2024-02-27 NOTE — Plan of Care (Signed)

## 2024-02-27 NOTE — Progress Notes (Signed)
 Mobility Specialist Progress Note:    02/27/24 1149  Mobility  Activity Transferred from chair to bed  Level of Assistance Minimal assist, patient does 75% or more  Assistive Device Front wheel walker  Distance Ambulated (ft) 4 ft  RUE Weight Bearing Per Provider Order NWB  LUE Weight Bearing Per Provider Order NWB  Activity Response Tolerated well  Mobility Referral Yes  Mobility visit 1 Mobility  Mobility Specialist Start Time (ACUTE ONLY) 1145  Mobility Specialist Stop Time (ACUTE ONLY) 1149  Mobility Specialist Time Calculation (min) (ACUTE ONLY) 4 min   Pt received in chair, requesting to transfer C>B. Tolerated well, required minA to stand with RW. Asx throughout. Left pt in bed with call bell in reach, all needs met.    Keegan Ducey Mobility Specialist Please contact via Special educational needs teacher or  Rehab office at [phone]

## 2024-02-27 NOTE — Progress Notes (Addendum)
 19 Days Post-Op Procedure(s) (LRB): MITRAL VALVE REPAIR USING A SIMULUS SEMI-RIGID ANNULOPLASTY BAND (N/A) MAZE PROCEDURE (N/A) CLIPPING, LEFT ATRIAL APPENDAGE USING A MEDTRONIC CLIP (N/A) ECHOCARDIOGRAM, TRANSESOPHAGEAL (N/A) Subjective: + DOE slowly improving  Objective: Vital signs in last 24 hours: Temp:  [97.6 F (36.4 C)-98 F (36.7 C)] 97.8 F (36.6 C) (04/28 0430) Pulse Rate:  [78-94] 94 (04/28 0000) Cardiac Rhythm: Atrial fibrillation (04/27 2248) Resp:  [19-20] 20 (04/27 1552) BP: (107-124)/(49-96) 107/96 (04/28 0430) SpO2:  [94 %-97 %] 94 % (04/28 0000) Weight:  [80 kg] 80 kg (04/28 0500)  Hemodynamic parameters for last 24 hours: CVP:  [11 mmHg-28 mmHg] 11 mmHg  Intake/Output from previous day: 04/27 0701 - 04/28 0700 In: 120 [P.O.:120] Out: 2600 [Urine:2600] Intake/Output this shift: No intake/output data recorded.  General appearance: alert, cooperative, and no distress Heart: regular rate and rhythm Lungs: dim in left L right base Abdomen: benign Extremities: will remove dressing, doesn't appear to have much edema but hard to tell for sure Wound: inics healing well  Lab Results: No results for input(s): "WBC", "HGB", "HCT", "PLT" in the last 72 hours. BMET:  Recent Labs    02/26/24 1600 02/26/24 2200  NA 120* 122*  K 3.8 4.2  CL 83* 86*  CO2 24 25  GLUCOSE 134* 119*  BUN 27* 32*  CREATININE 1.37* 1.26*  CALCIUM  8.0* 8.1*    PT/INR: No results for input(s): "LABPROT", "INR" in the last 72 hours. ABG    Component Value Date/Time   PHART 7.367 02/08/2024 1849   HCO3 23.9 02/08/2024 1849   TCO2 25 02/08/2024 1849   ACIDBASEDEF 2.0 02/08/2024 1849   O2SAT 73.1 02/27/2024 0550   CBG (last 3)  No results for input(s): "GLUCAP" in the last 72 hours.  Meds Scheduled Meds:  amiodarone   400 mg Oral BID   apixaban   5 mg Oral BID   atorvastatin   10 mg Oral Daily   Chlorhexidine  Gluconate Cloth  6 each Topical Daily   [place]  Cardiac Surgery, Patient & Family Education   Does not apply Once   dapagliflozin propanediol  10 mg Oral Daily   ezetimibe   10 mg Oral Daily   feeding supplement  237 mL Oral BID BM   losartan   12.5 mg Oral Daily   magnesium  gluconate  500 mg Oral Daily   melatonin  3 mg Oral QHS   multivitamin with minerals  1 tablet Oral Daily   pantoprazole   40 mg Oral Daily   potassium chloride   20 mEq Oral Daily   sodium chloride  flush  3-10 mL Intravenous Q12H   spironolactone   25 mg Oral Daily   urea  30 g Oral BID   Continuous Infusions:  promethazine  (PHENERGAN ) injection (IM or IVPB) Stopped (02/20/24 0000)   PRN Meds:.loperamide , ondansetron  (ZOFRAN ) IV, mouth rinse, oxyCODONE , phenol, polyethylene glycol, promethazine  (PHENERGAN ) injection (IM or IVPB), sodium chloride  flush, traMADol , witch hazel-glycerin   Xrays DG Chest 2 View Result Date: 02/26/2024 CLINICAL DATA:  Follow-up pleural effusion EXAM: CHEST - 2 VIEW COMPARISON:  02/22/2024 FINDINGS: Right-sided PICC is again noted and stable. Cardiac shadow is mildly prominent accentuated by the portable technique. Postsurgical changes are again seen. Increasing consolidation in the left base is noted with associated small effusion. Stable blunting of the right costophrenic angle is noted as well. IMPRESSION: Increasing left basilar consolidation with effusion. Electronically Signed   By: Violeta Grey M.D.   On: 02/26/2024 11:05    Assessment/Plan: S/P  Procedure(s) (LRB): MITRAL VALVE REPAIR USING A SIMULUS SEMI-RIGID ANNULOPLASTY BAND (N/A) MAZE PROCEDURE (N/A) CLIPPING, LEFT ATRIAL APPENDAGE USING A MEDTRONIC CLIP (N/A) ECHOCARDIOGRAM, TRANSESOPHAGEAL (N/A) POD#19  1 afeb, VSS, short episode of afib, currently in sinus ,  on amio and apix 2 sats ok on RA 3 excellent UOP 4 Co-Ox 73 5 sodium 122- appreciate nephrology consult  and recs- defer further management to them 6 creat improved to 1.26 7 GDMT per AHF 8 slow  progress w/ PT but improving 9 will get IR to do left thoracentesis   LOS: 19 days    Wayne E Gold PA-C Pager [phone] 02/27/2024   Agree  Once Na begins to normalize hopefully we can transition to po diuretic and DC to home

## 2024-02-27 NOTE — Progress Notes (Signed)
 PT Cancellation Note  Patient Details Name: [name] MRN: [id number] DOB: 21-Dec-1940   Cancelled Treatment:    Reason Eval/Treat Not Completed: Patient at procedure or test/unavailable  Off the floor for thoracentesis. Will reattempt as noted ? Discharge today.    Gayle Kava, PT Acute Rehabilitation Services  Office [phone]  [place] Leep 02/27/2024, 9:03 AM

## 2024-02-27 NOTE — TOC Transition Note (Signed)
 Transition of Care [facility]) - Discharge Note   Patient Details  Name: [name] MRN: [id number] Date of Birth: [birth year]  Transition of Care [facility]) CM/SW Contact:  Benjiman Bras, RN Phone Number: [phone] 02/27/2024, 1:39 PM   Clinical Narrative:     TOC CM spoke to pt and husband at bedside. Pt states she has access to Rollator and shower chair. Pt HH arranged with [facility]. Pt is requesting to go to IP rehab. Consult placed for review if appropriate for IP rehab.     Barriers to Discharge: No Barriers Identified   Patient Goals and CMS Choice Patient states their goals for this hospitalization and ongoing recovery are:: wants to get better CMS Medicare.gov Compare Post Acute Care list provided to:: Patient Choice offered to / list presented to : Patient      Discharge Placement                       Discharge Plan and Services Additional resources added to the After Visit Summary for     Discharge Planning Services: CM Consult Post Acute Care Choice: Home Health                    HH Arranged: RN, PT, OT [facility] Agency: [facility] Date [facility] Agency Contacted: 02/27/24 Time HH Agency Contacted: [phone] Representative spoke with at [facility] Agency: [name]  Social Drivers of Health (SDOH) Interventions SDOH Screenings   Food Insecurity: No Food Insecurity (02/08/2024)  Housing: Low Risk  (02/08/2024)  Transportation Needs: No Transportation Needs (02/08/2024)  Utilities: Not At Risk (02/08/2024)  Depression (PHQ2-9): Low Risk  (12/20/2023)  Social Connections: Socially Integrated (02/09/2024)  Tobacco Use: Low Risk  (02/08/2024)     Readmission Risk Interventions     No data to display

## 2024-02-27 NOTE — Progress Notes (Signed)
 Inpatient Rehab Admissions Coordinator:   Received consult, therapy is recommending Home Health at this time. Patient does not meet criteria for CIR. Signing off.   Rehab Admissons Coordinator Raechell Singleton, [place], [place] [phone]

## 2024-02-27 NOTE — Progress Notes (Signed)
 PT Cancellation Note  Patient Details Name: [name] MRN: [id number] DOB: Nov 06, 1940   Cancelled Treatment:    Reason Eval/Treat Not Completed: Patient declined  Stated she hasn't slept in 2 nights and is too tired right now. Explained if goal is to go home, she really needs to walk more and she became adamant that she could not right now. Asked PT to return in an hour.    Gayle Kava, PT Acute Rehabilitation Services  Office [phone]    [place] Leep 02/27/2024, 11:36 AM

## 2024-02-27 NOTE — Progress Notes (Signed)
 Physical Therapy Treatment Patient Details Name: [name] MRN: [id number] DOB: [birth year] Today's Date: 02/27/2024   History of Present Illness 83 y.o. female presents to [facility] 02/08/24 for complex MVR and MAZE (via sternotomy). Post-op complicated by persistent a fib RVR and volume overload. Cardioversion x2 4/18 without resolution of afib.  4/21 returned to [facility]. 4/9-4/23 ICU stay. Plan for repeat DCCV 4/25. PMHx: anemia, HTN, mitral valve prolapse, moderate mitral regurgitation, tricuspid regurgitation    PT Comments  Finally able to see pt on 4th attempt today. Husband present throughout session for education. Patient ambulated 150 ft with RW and ascended/descended 1 step x4 reps with min assist. Husband educated on how to assist pt up steps at home and discussed possibly getting a cane for her to hold in her opposite hand (since they don't have a rail). Husband now expressing concern that pt will not be ready to go home "for several days" and asked him to clarify his concerns. "Well she's not even able to walk to the bathroom in time." Discussed that she has the ability to walk to the bathroom, however the lasix  is making it difficult for her to get there in time. Discussed she will likely go home on lasix  and she will likely need to wear pullups/Depends/pad at home. RN made aware of husband's concerns. Will continue to follow.     If plan is discharge home, recommend the following: A little help with walking and/or transfers;A little help with bathing/dressing/bathroom;Assistance with cooking/housework;Assist for transportation;Help with stairs or ramp for entrance   Can travel by private vehicle        Equipment Recommendations  None recommended by PT    Recommendations for Other Services       Precautions / Restrictions Precautions Precautions: Fall;Sternal Precaution Booklet Issued: Yes (comment) Recall of Precautions/Restrictions: Impaired Precaution/Restrictions Comments: Watch  HR, needs cues for sternal precautions when going to sit down Restrictions Weight Bearing Restrictions Per Provider Order: No Other Position/Activity Restrictions: sternal     Mobility  Bed Mobility               General bed mobility comments: up on BSC on arrival    Transfers Overall transfer level: Needs assistance Equipment used: None, Rolling walker (2 wheels) (heart pillow) Transfers: Sit to/from Stand, Bed to chair/wheelchair/BSC Sit to Stand: Contact guard assist, Min assist (with use of heart pillow)   Step pivot transfers: Contact guard assist       General transfer comment: Pt initially required MinA for boost up from EOB. vc for leaning forward over BOS and Progressed to CGA x 3 more reps.    Ambulation/Gait Ambulation/Gait assistance: Contact guard assist Gait Distance (Feet): 150 Feet Assistive device: Rolling walker (2 wheels) Gait Pattern/deviations: Step-through pattern, Decreased stride length Gait velocity: decr     General Gait Details: Good maneuvering of RW; x1 instructed not to lift it over base of IV pole   Stairs Stairs: Yes Stairs assistance: Contact guard assist Stair Management: One rail Right, Forwards Number of Stairs: 4 (1 x 4 reps) General stair comments: pt with light use of foot of bed to simulate rail; husband present and demonstrated to pt/husband how he can support her as she goes up the steps; states he may purchase a cane so she has something to hold onto in her other hand   Wheelchair Mobility     Tilt Bed    Modified Rankin (Stroke Patients Only)       Balance Overall  balance assessment: Needs assistance, Mild deficits observed, not formally tested Sitting-balance support: No upper extremity supported, Feet supported Sitting balance-Leahy Scale: Fair     Standing balance support: During functional activity, No upper extremity supported Standing balance-Leahy Scale: Fair Standing balance comment: able to stand  statically; RW for dynamic standing during tasks and for functional transfers/mobility                            Communication Communication Communication: Impaired Factors Affecting Communication: Hearing impaired (has hearing aids)  Cognition Arousal: Alert Behavior During Therapy: WFL for tasks assessed/performed, Flat affect   PT - Cognitive impairments: No apparent impairments                         Following commands: Intact      Cueing Cueing Techniques: Verbal cues  Exercises      General Comments General comments (skin integrity, edema, etc.): HR 80s throughout      Pertinent Vitals/Pain Pain Assessment Pain Assessment: Faces Faces Pain Scale: Hurts a little bit Pain Location: sternal incision Pain Descriptors / Indicators: Sore Pain Intervention(s): Limited activity within patient's tolerance, Monitored during session    Home Living                          Prior Function            PT Goals (current goals can now be found in the care plan section) Acute Rehab PT Goals Patient Stated Goal: to feel better Time For Goal Achievement: 03/08/24 Potential to Achieve Goals: Good Progress towards PT goals: Progressing toward goals    Frequency    Min 2X/week      PT Plan      Co-evaluation              AM-PAC PT "6 Clicks" Mobility   Outcome Measure  Help needed turning from your back to your side while in a flat bed without using bedrails?: A Little Help needed moving from lying on your back to sitting on the side of a flat bed without using bedrails?: A Little Help needed moving to and from a bed to a chair (including a wheelchair)?: A Little Help needed standing up from a chair using your arms (e.g., wheelchair or bedside chair)?: A Little Help needed to walk in hospital room?: A Little Help needed climbing 3-5 steps with a railing? : A Little 6 Click Score: 18    End of Session Equipment Utilized During  Treatment: Gait belt Activity Tolerance: Patient tolerated treatment well Patient left: with call bell/phone within reach;with family/visitor present;in chair Nurse Communication: Mobility status;Other (comment) (husband's concern re: not making it to the bathroom in time; recommended they get pullups) PT Visit Diagnosis: Unsteadiness on feet (R26.81);Muscle weakness (generalized) (M62.81);Other abnormalities of gait and mobility (R26.89)     Time: 1610-9604 PT Time Calculation (min) (ACUTE ONLY): 37 min  Charges:    $Gait Training: 23-37 mins PT General Charges $$ ACUTE PT VISIT: 1 Visit                      Gayle Kava, PT Acute Rehabilitation Services  Office [phone]    [place] Leep 02/27/2024, 4:06 PM

## 2024-02-27 NOTE — Procedures (Signed)
 PROCEDURE SUMMARY:  Successful image-guided right-sided therapeutic thoracentesis. Yielded 1.2 liters of clear, blood-tinged pleural fluid. Patient tolerated procedure well. EBL: Zero No immediate complications.  Post procedure CXR shows no pneumothorax.  Please see imaging section of Epic for full dictation.  Gordy Lauber Lennox Leikam PA-C 02/27/2024 9:47 AM

## 2024-02-27 NOTE — Progress Notes (Addendum)
 Advanced Heart Failure Rounding Note  Cardiologist: [name], MD   Chief Complaint: Acute systolic heart failure  Subjective:    - S/p MVR 02/08/24. Post-op complicated by persistent a fib RVR and volume overload. - Failed DCCV on 4/18 - Back in SR 04/21. Diuretics stopped d/t hypochloremia, hypokalemia and hyponatremia. Given hypertonic saline. 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 Started on IV lasix  80 X 2 + urea 30g BID + Farxiga.  Na 120>122>123 Cl 85 BUN 32>52 Scr stable 1.23  Unable to obtain reliable CVP waveform.  Has been ambulating daily. Continues with shortness of breath. Planning for left thoracentesis today.   Objective:    Weight Range: 80 kg Body mass index is 30.27 kg/m.   Vital Signs:   Temp:  [97.6 F (36.4 C)-98 F (36.7 C)] 97.8 F (36.6 C) (04/28 0430) Pulse Rate:  [78-94] 94 (04/28 0000) Resp:  [19-20] 20 (04/27 1552) BP: (107-124)/(49-96) 107/96 (04/28 0430) SpO2:  [94 %-97 %] 94 % (04/28 0000) Weight:  [80 kg] 80 kg (04/28 0500) Last BM Date : 02/25/24  Weight change: Filed Weights   02/25/24 0500 02/26/24 0629 02/27/24 0500  Weight: 74.1 kg 74.6 kg 80 kg   Intake/Output:  Intake/Output Summary (Last 24 hours) at 02/27/2024 0707 Last data filed at 02/27/2024 0600 Gross per 24 hour  Intake 120 ml  Output 2600 ml  Net -2480 ml    Physical Exam  General:  Sitting up in chair. No distress.  Neck: JVP 7-8 GNF:AOZHYQM rate & rhythm. No rubs, gallops or murmurs. Lungs: diminished left base Abdomen: soft, nontender, nondistended.  Extremities: 2+ edema, + UNNA Neuro: alert & orientedx3. Affect pleasant   Telemetry   Afib for 4 hrs overnight, back in SR around 3 am  Labs    CBC No results for input(s): "WBC", "NEUTROABS", "HGB", "HCT", "MCV", "PLT" in the last 72 hours.  Basic Metabolic  Panel Recent Labs    02/25/24 0436 02/26/24 0430 02/26/24 1110 02/26/24 1600 02/26/24 2200  NA 126* 123*   < > 120* 122*  K 4.0 3.8   < > 3.8 4.2  CL 89* 89*   < > 83* 86*  CO2 26 23   < > 24 25  GLUCOSE 112* 99   < > 134* 119*  BUN 19 18   < > 27* 32*  CREATININE 1.40* 1.25*   < > 1.37* 1.26*  CALCIUM  8.0* 7.5*   < > 8.0* 8.1*  MG 1.7 1.6*  --   --   --    < > = values in this interval not displayed.   Imaging   DG Chest 2 View Result Date: 02/26/2024 CLINICAL DATA:  Follow-up pleural effusion EXAM: CHEST - 2 VIEW COMPARISON:  02/22/2024 FINDINGS: Right-sided PICC is again noted and stable. Cardiac shadow is mildly prominent accentuated by the portable technique. Postsurgical changes are again seen. Increasing consolidation in the left base is noted with associated small effusion. Stable blunting of the right costophrenic angle is noted as well. IMPRESSION: Increasing left basilar consolidation with effusion. Electronically Signed   By: Violeta Grey M.D.   On: 02/26/2024 11:05     Medications:    Scheduled Medications:  amiodarone   400 mg Oral BID   apixaban   5 mg Oral BID   atorvastatin   10 mg Oral Daily   Chlorhexidine  Gluconate  Cloth  6 each Topical Daily   [place] Cardiac Surgery, Patient & Family Education   Does not apply Once   dapagliflozin propanediol  10 mg Oral Daily   ezetimibe   10 mg Oral Daily   feeding supplement  237 mL Oral BID BM   losartan   12.5 mg Oral Daily   magnesium  gluconate  500 mg Oral Daily   melatonin  3 mg Oral QHS   multivitamin with minerals  1 tablet Oral Daily   pantoprazole   40 mg Oral Daily   potassium chloride   20 mEq Oral Daily   sodium chloride  flush  3-10 mL Intravenous Q12H   spironolactone   25 mg Oral Daily   urea  30 g Oral BID   Infusions:  promethazine  (PHENERGAN ) injection (IM or IVPB) Stopped (02/20/24 0000)   PRN Medications: loperamide , ondansetron  (ZOFRAN ) IV, mouth rinse, oxyCODONE , phenol, polyethylene glycol,  promethazine  (PHENERGAN ) injection (IM or IVPB), sodium chloride  flush, traMADol , witch hazel-glycerin   Patient Profile   [name] is a 83 y.o. female with HTN and PAF. Admitted with SOB> found to have severe MR. Now s/p MVR, MAZE and LAA clipping. AHF team consulted for acute systolic heart failure.   Assessment/Plan  Acute systolic heart failure, NiCM - Intra-op TEE 02/08/24 with EF 55-60% and severe MR - [facility] 2/25 with no CAD. RA 8, PA 41/17 (14), Fick CO/CI 7.76/4.41, PAPi 3, LVEDP 11 - Echo 4/25 with EF 35-40%, LV with GHK, RV normal, LA mod dilated, RA severely dilated, trivial MR, mod TR - Suspect drop in EF 2/2 persistent AF RVR.  - NYHA II-IIIa, IV on admission - CO-OX 73% off milrinone . - Remains volume up. Already got 80 mg IV lasix  X 1 this am, give 160 mg IV this afternoon. Will also add on 500 mg diamox  PO once. Supp K and Mag. - Continue Farxiga 10 mg daily - Now off digoxin  - Continue spiro 25 mg daily - Continue UNNA boots   Atrial fibrillation with RVR - S/p Maze and LAA clipping 4/25 - Failed DCCV X 2 04/17 - In and out of Afib, back in SR around 3 am.  - Continue amiodarone  400 mg BID - Continue eliquis  5 mg BID.   Severe MR s/p MVR - Intra-op TEE with severe MR 02/08/24 - Trivial MR on echo 02/14/24  4. AKI - baseline Cr normal - Cr stable ~ 1.23 today - Watch with diuresis  7. Hypokalemia - Supp aggressively with diuresis  8. Hypervolemic hyponatremia - Nephrology consulted - Felt to be multifactorial, volume overload, component of SIADH - Urine osmolality 275 and Urine Na 46 on 04/27 - Na 120>123 last 24 hrs - Increasing diuretics as above. Nephrology added Ure-Na. Holding off on hypertonic saline as low as Na continues to improve  9. Left pleural effusion - Suspect contributing to dyspnea - Left thoracentesis with IR today  10. Debility - Continue aggressive PT/OT    Length of Stay: 19  [name] N, PA-C  02/27/2024, 7:07  AM

## 2024-02-27 NOTE — TOC Transition Note (Signed)
 Transition of Care [facility]) - Discharge Note   Patient Details  Name: [name] MRN: [id number] Date of Birth: April 22, 1941  Transition of Care [facility]) CM/SW Contact:  Benjiman Bras, RN Phone Number: [phone] 02/27/2024, 1:42 PM   Clinical Narrative:    IP rehab evaluated for CIR. PT recommending HH. Pt HH arranged with [facility]. Pt has needed DME in home. Husband will provide transportation home.    Final next level of care: Home w Home Health Services Barriers to Discharge: No Barriers Identified   Patient Goals and CMS Choice Patient states their goals for this hospitalization and ongoing recovery are:: wants to get better CMS Medicare.gov Compare Post Acute Care list provided to:: Patient Choice offered to / list presented to : Patient      Discharge Placement                       Discharge Plan and Services Additional resources added to the After Visit Summary for     Discharge Planning Services: CM Consult Post Acute Care Choice: Home Health                    HH Arranged: RN, PT, OT [facility] Agency: [facility] Date [facility] Agency Contacted: 02/27/24 Time HH Agency Contacted: 1339 Representative spoke with at [facility] Agency: [name]  Social Drivers of Health (SDOH) Interventions SDOH Screenings   Food Insecurity: No Food Insecurity (02/08/2024)  Housing: Low Risk  (02/08/2024)  Transportation Needs: No Transportation Needs (02/08/2024)  Utilities: Not At Risk (02/08/2024)  Depression (PHQ2-9): Low Risk  (12/20/2023)  Social Connections: Socially Integrated (02/09/2024)  Tobacco Use: Low Risk  (02/08/2024)     Readmission Risk Interventions     No data to display

## 2024-02-27 NOTE — Progress Notes (Signed)
 CARDIAC REHAB PHASE I   Stopped by to offer walk in hallway. Pt declined for now. Reports feeling little tired after IR procedure, would like to rest a little. Postop [facility] education completed 4-26. No questions or concerns. Will continue to follow.   0940-1000  Ronny Colas, RN BSN 02/27/2024 10:01 AM

## 2024-02-28 LAB — COOXEMETRY PANEL
Carboxyhemoglobin: 2.1 % — ABNORMAL HIGH (ref 0.5–1.5)
Methemoglobin: 1.1 % (ref 0.0–1.5)
O2 Saturation: 82.9 %
Total hemoglobin: 8.4 g/dL — ABNORMAL LOW (ref 12.0–16.0)

## 2024-02-28 LAB — BASIC METABOLIC PANEL WITH GFR
Anion gap: 10 (ref 5–15)
Anion gap: 10 (ref 5–15)
BUN: 60 mg/dL — ABNORMAL HIGH (ref 8–23)
BUN: 68 mg/dL — ABNORMAL HIGH (ref 8–23)
CO2: 27 mmol/L (ref 22–32)
CO2: 27 mmol/L (ref 22–32)
Calcium: 7.9 mg/dL — ABNORMAL LOW (ref 8.9–10.3)
Calcium: 8.4 mg/dL — ABNORMAL LOW (ref 8.9–10.3)
Chloride: 87 mmol/L — ABNORMAL LOW (ref 98–111)
Chloride: 86 mmol/L — ABNORMAL LOW (ref 98–111)
Creatinine, Ser: 1.41 mg/dL — ABNORMAL HIGH (ref 0.44–1.00)
Creatinine, Ser: 1.47 mg/dL — ABNORMAL HIGH (ref 0.44–1.00)
GFR, Estimated: 38 mL/min — ABNORMAL LOW (ref >60)
GFR, Estimated: 36 mL/min — ABNORMAL LOW (ref >60)
Glucose, Bld: 102 mg/dL — ABNORMAL HIGH (ref 70–99)
Glucose, Bld: 114 mg/dL — ABNORMAL HIGH (ref 70–99)
Potassium: 3.4 mmol/L — ABNORMAL LOW (ref 3.5–5.1)
Potassium: 3.8 mmol/L (ref 3.5–5.1)
Sodium: 124 mmol/L — ABNORMAL LOW (ref 135–145)
Sodium: 123 mmol/L — ABNORMAL LOW (ref 135–145)

## 2024-02-28 LAB — MAGNESIUM: Magnesium: 2.3 mg/dL (ref 1.7–2.4)

## 2024-02-28 MED ORDER — TORSEMIDE 20 MG PO TABS
40.0000 mg | ORAL_TABLET | Freq: Two times a day (BID) | ORAL | Status: AC
  Administered 2024-02-28 (×2): 40 mg via ORAL
  Filled 2024-02-28 (×2): qty 2

## 2024-02-28 MED ORDER — ACETAZOLAMIDE 250 MG PO TABS
500.0000 mg | ORAL_TABLET | Freq: Once | ORAL | Status: AC
  Administered 2024-02-28: 500 mg via ORAL
  Filled 2024-02-28: qty 2

## 2024-02-28 MED ORDER — POTASSIUM CHLORIDE CRYS ER 20 MEQ PO TBCR: 20.0000 meq | EXTENDED_RELEASE_TABLET | Freq: Every day | ORAL | Status: DC

## 2024-02-28 MED ORDER — POTASSIUM CHLORIDE CRYS ER 20 MEQ PO TBCR
30.0000 meq | EXTENDED_RELEASE_TABLET | Freq: Two times a day (BID) | ORAL | Status: DC
  Administered 2024-02-28 (×2): 30 meq via ORAL
  Administered 2024-02-29: 10 meq via ORAL
  Filled 2024-02-28 (×3): qty 1

## 2024-02-28 MED ORDER — UREA 15 G PO PACK
15.0000 g | PACK | Freq: Two times a day (BID) | ORAL | Status: DC
  Administered 2024-02-28 – 2024-02-29 (×3): 15 g via ORAL
  Filled 2024-02-28 (×3): qty 1

## 2024-02-28 MED ORDER — POTASSIUM CHLORIDE CRYS ER 20 MEQ PO TBCR
20.0000 meq | EXTENDED_RELEASE_TABLET | Freq: Once | ORAL | Status: AC
  Administered 2024-02-28: 20 meq via ORAL
  Filled 2024-02-28: qty 1

## 2024-02-28 NOTE — Progress Notes (Addendum)
 Pt helped to [facility], will try to return in the afternoon to assist with ambulation in the hallway if time allows.  Barkley Li MS ACSM-CEP .02/28/2024 9:57 AM '

## 2024-02-28 NOTE — Progress Notes (Addendum)
 Advanced Heart Failure Rounding Note  Cardiologist: [name], MD   Chief Complaint: Acute systolic heart failure  Subjective:    - S/p MVR 02/08/24. Post-op complicated by persistent a fib RVR and volume overload. - Failed DCCV on 4/18 - Back in SR 04/21. Diuretics stopped d/t hypochloremia, hypokalemia and hyponatremia. Given hypertonic saline. - Milrinone  stopped 04/22 - Left thoracentesis 04/28  Dyspnea much improved after thoracentesis yesterday.   CVP not hooked up. Reports great UOP with IV lasix  + diamox . Is/Os not complete. Weights up and down ( mix of bed and standing) but seems to be trending right direction.    Objective:    Weight Range: 72.1 kg Body mass index is 27.29 kg/m.   Vital Signs:   Temp:  [97.5 F (36.4 C)-98 F (36.7 C)] 97.7 F (36.5 C) (04/29 0307) Pulse Rate:  [70-81] 72 (04/29 0436) Resp:  [12-22] 12 (04/29 0436) BP: (85-108)/(37-56) 104/55 (04/29 0307) SpO2:  [95 %-98 %] 98 % (04/29 0436) Weight:  [72.1 kg] 72.1 kg (04/29 0307) Last BM Date : 02/25/24  Weight change: Filed Weights   02/26/24 0629 02/27/24 0500 02/28/24 0307  Weight: 74.6 kg 80 kg 72.1 kg   Intake/Output:  Intake/Output Summary (Last 24 hours) at 02/28/2024 0703 Last data filed at 02/28/2024 0500 Gross per 24 hour  Intake 586 ml  Output 450 ml  Net 136 ml    Physical Exam  General:  Sitting up in chair. No distress. Neck: JVP not elevated Cor: Regular rate & rhythm. No rubs, gallops or murmurs. Lungs: Improved breath sounds left base Abdomen: soft, nontender, nondistended.  Extremities: + UNNA, 1-2+ edema Neuro: alert & orientedx3. Affect pleasant    Telemetry   SR 70s this am  Labs    CBC No results for input(s): "WBC", "NEUTROABS", "HGB", "HCT", "MCV", "PLT" in the last 72 hours.  Basic Metabolic Panel Recent Labs    14/78/29 0755 02/27/24 1711 02/28/24 0447  NA 123* 121* 124*  K 4.1 3.7 3.4*  CL 85* 84* 87*  CO2 26 26 27   GLUCOSE 118*  123* 102*  BUN 52* 46* 60*  CREATININE 1.23* 1.35* 1.41*  CALCIUM  8.5* 8.2* 7.9*  MG 1.8  --  2.3   Imaging   IR THORACENTESIS ASP PLEURAL SPACE W/IMG GUIDE Result Date: 02/27/2024 INDICATION: 83 year old female status post Maze procedure, mitral valve repair, with new onset pleural effusion. IR was requested for therapeutic thoracentesis. EXAM: ULTRASOUND GUIDED THERAPEUTIC THORACENTESIS MEDICATIONS: 5 cc of 1% lidocaine  COMPLICATIONS: None immediate. PROCEDURE: An ultrasound guided thoracentesis was thoroughly discussed with the patient and questions answered. The benefits, risks, alternatives and complications were also discussed. The patient understands and wishes to proceed with the procedure. Written consent was obtained. Ultrasound was performed to localize and mark an adequate pocket of fluid in the left chest. The area was then prepped and draped in the normal sterile fashion. 1% Lidocaine  was used for local anesthesia. Under ultrasound guidance a 6 Fr Safe-T-Centesis catheter was introduced. Thoracentesis was performed. The catheter was removed and a dressing applied. FINDINGS: A total of approximately 1.2 L of clear, blood-tinged pleural fluid was removed. IMPRESSION: Successful ultrasound guided left thoracentesis yielding 1.2 L of pleural fluid. Procedure performed by Lambert Pillion, PA-C Electronically Signed   By: Nicoletta Barrier M.D.   On: 02/27/2024 12:05   DG Chest 1 View Result Date: 02/27/2024 CLINICAL DATA:  Post thoracentesis on the left. EXAM: CHEST  1 VIEW COMPARISON:  Radiographs 02/26/2024  and 02/22/2024. Chest CT 01/30/2024. FINDINGS: 0919 hours. Interval decreased left pleural effusion with improved aeration of the left lung. There are small residual bilateral pleural effusions with mild bibasilar atelectasis. No evidence of pneumothorax. A skin fold overlies the left chest. Right arm PICC projects to the lower SVC level. The heart size and mediastinal contours are stable post  median sternotomy, mitral valve repair and left atrial appendage clipping. No acute osseous findings are seen. IMPRESSION: Interval decreased left pleural effusion with improved aeration of the left lung. No evidence of pneumothorax. Electronically Signed   By: Elmon Hagedorn M.D.   On: 02/27/2024 09:48     Medications:    Scheduled Medications:  amiodarone   400 mg Oral BID   apixaban   5 mg Oral BID   atorvastatin   10 mg Oral Daily   Chlorhexidine  Gluconate Cloth  6 each Topical Daily   [facility], Patient [facility] Family Education   Does not apply Once   dapagliflozin propanediol  10 mg Oral Daily   ezetimibe   10 mg Oral Daily   feeding supplement  237 mL Oral BID BM   losartan   12.5 mg Oral Daily   magnesium  gluconate  500 mg Oral Daily   melatonin  3 mg Oral QHS   multivitamin with minerals  1 tablet Oral Daily   pantoprazole   40 mg Oral Daily   potassium chloride   20 mEq Oral Daily   sodium chloride  flush  3-10 mL Intravenous Q12H   spironolactone   25 mg Oral Daily   urea  30 g Oral BID   Infusions:  promethazine  (PHENERGAN ) injection (IM or IVPB) Stopped (02/20/24 0000)   PRN Medications: loperamide , ondansetron  (ZOFRAN ) IV, mouth rinse, oxyCODONE , phenol, polyethylene glycol, promethazine  (PHENERGAN ) injection (IM or IVPB), sodium chloride  flush, traMADol , witch hazel-glycerin   Patient Profile   [name] is a 83 y.o. female with HTN and PAF. Admitted with SOB> found to have severe MR. Now s/p MVR, MAZE and LAA clipping. AHF team consulted for acute systolic heart failure.   Assessment/Plan  Acute systolic heart failure, NiCM - Intra-op TEE 02/08/24 with EF 55-60% and severe MR - [facility] 2/25 with no CAD. RA 8, PA 41/17 (14), Fick CO/CI 7.76/4.41, PAPi 3, LVEDP 11 - Echo 4/25 with EF 35-40%, LV with GHK, RV normal, LA mod dilated, RA severely dilated, trivial MR, mod TR - Suspect drop in EF 2/2 persistent AF RVR.  - NYHA II-IIIa, IV on admission - CO-OX has  been stable. Will stop checking. - Volume improving with aggressive diuresis. Start po Torsemide  40 BID and give 500 mg diamox  once today. - Continue Farxiga 10 mg daily - Now off digoxin  - Continue spiro 25 mg daily - Continue UNNA boots   Atrial fibrillation with RVR - S/p Maze and LAA clipping 4/25 - Failed DCCV X 2 04/17 - In and out of Afib this admit, SR this am - Continue amiodarone  400 mg BID - Continue eliquis  5 mg BID.   Severe MR s/p MVR - Intra-op TEE with severe MR 02/08/24 - Trivial MR on echo 02/14/24  4. AKI - baseline Cr normal - Cr up slightly, 1.2>1.35>1.41, BUN trending up but also on Ure-Na - Switching IV lasix  to po diuretic  7. Hypokalemia - Supp aggressively with diuresis  8. Hypervolemic hyponatremia - Nephrology consulted - Felt to be multifactorial, volume overload, component of SIADH - Improving with aggressive diuresis - Nephrology added Ure-Na. Holding off on hypertonic saline as low  as Na continues to improve  9. Left pleural effusion - S/p left thoracentesis on 04/28  10. Debility - Continue aggressive PT/OT    Length of Stay: 20  Ennis Delpozo N, PA-C  02/28/2024, 7:03 AM

## 2024-02-28 NOTE — Progress Notes (Signed)
 [name] is an 83 y.o. female with a past medical history HTN and PAF who presents with shortness of breath with severe mitral regurgitation status post mitral valve replacement, MAZE, and LAA clipping.  Course complicated by systolic heart failure.  We were consulted for hyponatremia.   Assessment/Plan: Hypervolemic hyponatremia: Worsening and persistent.  No obvious symptoms.  Poor appetite for months now is certainly a reset osmostat could be playing a factor as well.  Likely multifactorial but certainly volume excess. - IV Lasix  80 mg 2 times Sunday and given shortness of breath, 2+ edema now with Unna boots, slow improvement in sodium s/p 1 dose on 4/28 -> now transitioned to torsemide . 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 Decr Ure-Na    Nonoliguric AKI: Creatinine has been elevated for most of the hospitalization but elevation has been fairly mild.  Likely some degree of ischemic injury during surgery.  Cholesterol embolic injury is always possible.  Also may just have lower GFR in the setting of her heart failure with new cardiorenal syndrome.  Will continue to monitor with diuresis; a little worse and will decr the Ure-Na.  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  Followed by heart failure team previously.  Continue with diuresis as above.  Continue with goal-directed medical therapy which has already been initiated.  Monitor low blood pressures closely. Started SGLT2i which has efficacy in CHF but also in hyponatremia mgmt   Atrial fibrillation with RVR: Management per primary team   Hypokalemia: On spironolactone .  Supplement as  needed  Subjective: Breathing much better after thoracentesis, intermittent cough but denies any nausea, headaches, severe pain.  Difficulty swallowing.    Chemistry and CBC: Creatinine, Ser  Date/Time Value Ref Range Status  02/28/2024 04:47 AM 1.41 (H) 0.44 - 1.00 mg/dL Final  16/08/9603 54:09 PM 1.35 (H) 0.44 - 1.00 mg/dL Final  81/19/1478 29:56 AM 1.23 (H) 0.44 - 1.00 mg/dL Final  21/30/8657 84:69 PM 1.26 (H) 0.44 - 1.00 mg/dL Final  62/95/2841 32:44 PM 1.37 (H) 0.44 - 1.00 mg/dL Final  11/03/7251 66:44 AM 1.35 (H) 0.44 - 1.00 mg/dL Final  03/47/4259 56:38 AM 1.25 (H) 0.44 - 1.00 mg/dL Final  75/64/3329 51:88 AM 1.40 (H) 0.44 - 1.00 mg/dL Final  41/66/0630 16:01 AM 1.33 (H) 0.44 - 1.00 mg/dL Final  09/32/3557 32:20 AM 1.28 (H) 0.44 - 1.00 mg/dL Final  25/42/7062 37:62 PM 1.37 (H) 0.44 - 1.00 mg/dL Final  83/15/1761 60:73 AM 1.44 (H) 0.44 - 1.00 mg/dL Final  71/04/2693 85:46 PM 1.48 (H) 0.44 - 1.00 mg/dL Final  27/12/5007 38:18 AM 1.47 (H) 0.44 - 1.00 mg/dL Final  29/93/7169 67:89 PM 1.41 (H) 0.44 - 1.00 mg/dL Final  38/08/1750 02:58 AM 1.39 (H) 0.44 - 1.00 mg/dL Final  52/77/8242 35:36 PM 1.31 (H) 0.44 - 1.00 mg/dL Final  14/43/1540 08:67 AM 1.41 (H) 0.44 - 1.00 mg/dL Final  61/95/0932 67:12 PM 1.49 (H) 0.44 - 1.00 mg/dL Final  45/80/9983 38:25 AM 1.46 (H) 0.44 - 1.00 mg/dL Final  05/39/7673 41:93 PM 1.54 (H) 0.44 - 1.00 mg/dL Final  79/12/4095 35:32 AM 1.48 (H) 0.44 - 1.00 mg/dL Final  99/24/2683 41:96 AM 1.34 (H) 0.44 -  1.00 mg/dL Final  16/08/9603 54:09 PM 1.28 (H) 0.44 - 1.00 mg/dL Final  81/19/1478 29:56 AM 1.29 (H) 0.44 - 1.00 mg/dL Final  21/30/8657 84:69 AM 1.15 (H) 0.44 - 1.00 mg/dL Final  62/95/2841 32:44 AM 1.13 (H) 0.44 - 1.00 mg/dL Final  11/03/7251 66:44 AM 1.22 (H) 0.44 - 1.00 mg/dL Final  03/47/4259 56:38 AM 1.18 (H) 0.44 - 1.00 mg/dL Final  75/64/3329 51:88 AM 1.17 (H) 0.44 - 1.00 mg/dL Final  41/66/0630 16:01 PM 1.27 (H) 0.44 - 1.00 mg/dL Final   09/32/3557 32:20 AM 0.91 0.44 - 1.00 mg/dL Final  25/42/7062 37:62 PM 0.86 0.44 - 1.00 mg/dL Final  83/15/1761 60:73 AM 0.90 0.44 - 1.00 mg/dL Final  71/04/2693 85:46 AM 0.90 0.44 - 1.00 mg/dL Final  27/12/5007 38:18 AM 0.80 0.44 - 1.00 mg/dL Final  29/93/7169 67:89 AM 0.90 0.44 - 1.00 mg/dL Final  38/08/1750 02:58 AM 1.00 0.44 - 1.00 mg/dL Final  52/77/8242 35:36 AM 1.15 (H) 0.44 - 1.00 mg/dL Final  14/43/1540 08:67 AM 0.81 0.44 - 1.00 mg/dL Final  61/95/0932 67:12 PM 0.76 0.44 - 1.00 mg/dL Final  45/80/9983 38:25 PM 0.95 0.57 - 1.00 mg/dL Final  05/39/7673 41:93 AM 1.13 (H) 0.57 - 1.00 mg/dL Final  79/12/4095 35:32 AM 0.88 0.44 - 1.00 mg/dL Final  99/24/2683 41:96 AM 0.88 0.44 - 1.00 mg/dL Final  22/29/7989 21:19 PM 1.12 0.40 - 1.20 mg/dL Final  41/74/0814 48:18 AM 0.73 0.50 - 1.10 mg/dL Final  56/31/4970 26:37 AM 0.78 0.50 - 1.10 mg/dL Final  85/88/5027 74:12 PM 0.83 0.50 - 1.10 mg/dL Final  87/86/7672 09:47 AM 0.85 0.50 - 1.10 mg/dL Final  09/62/8366 29:47 AM 0.87  Final   Recent Labs  Lab 02/26/24 0430 02/26/24 1110 02/26/24 1600 02/26/24 2200 02/27/24 0755 02/27/24 1711 02/28/24 0447  NA 123* 120* 120* 122* 123* 121* 124*  K 3.8 3.9 3.8 4.2 4.1 3.7 3.4*  CL 89* 84* 83* 86* 85* 84* 87*  CO2 23 24 24 25 26 26 27   GLUCOSE 99 113* 134* 119* 118* 123* 102*  BUN 18 18 27* 32* 52* 46* 60*  CREATININE 1.25* 1.35* 1.37* 1.26* 1.23* 1.35* 1.41*  CALCIUM  7.5* 8.2* 8.0* 8.1* 8.5* 8.2* 7.9*   Recent Labs  Lab 02/22/24 0838 02/24/24 0425  WBC 16.9* 12.9*  HGB 8.8* 8.3*  HCT 27.1* 25.4*  MCV 90.3 88.8  PLT 336 284   Liver Function Tests: No results for input(s): "AST", "ALT", "ALKPHOS", "BILITOT", "PROT", "ALBUMIN " in the last 168 hours. No results for input(s): "LIPASE", "AMYLASE" in the last 168 hours. No results for input(s): "AMMONIA" in the last 168 hours. Cardiac Enzymes: No results for input(s): "CKTOTAL", "CKMB", "CKMBINDEX", "TROPONINI" in the last 168  hours. Iron  Studies: No results for input(s): "IRON ", "TIBC", "TRANSFERRIN", "FERRITIN" in the last 72 hours. PT/INR: @LABRCNTIP (inr:5)  Xrays/Other Studies: ) Results for orders placed or performed during the hospital encounter of 02/08/24 (from the past 48 hours)  Basic metabolic panel     Status: Abnormal   Collection Time: 02/26/24 11:10 AM  Result Value Ref Range   Sodium 120 (L) 135 - 145 mmol/L   Potassium 3.9 3.5 - 5.1 mmol/L   Chloride 84 (L) 98 - 111 mmol/L   CO2 24 22 - 32 mmol/L   Glucose, Bld 113 (H) 70 - 99 mg/dL    Comment: Glucose reference range applies only to samples taken after fasting for at least 8 hours.   BUN 18 8 - 23  mg/dL   Creatinine, Ser 4.09 (H) 0.44 - 1.00 mg/dL   Calcium  8.2 (L) 8.9 - 10.3 mg/dL   GFR, Estimated 40 (L) >60 mL/min    Comment: (NOTE) Calculated using the CKD-EPI Creatinine Equation (2021)    Anion gap 12 5 - 15    Comment: Performed at [facility], [address] [place], [place] [address]  Osmolality, urine     Status: Abnormal   Collection Time: 02/26/24 11:30 AM  Result Value Ref Range   Osmolality, Ur 275 (L) 300 - 900 mOsm/kg    Comment: Performed at [facility], [address] [place], [place] [address]  Sodium, urine, random     Status: None   Collection Time: 02/26/24 11:30 AM  Result Value Ref Range   Sodium, Ur 46 mmol/L    Comment: Performed at [facility], [address], [place], [place] [address]  Basic metabolic panel     Status: Abnormal   Collection Time: 02/26/24  4:00 PM  Result Value Ref Range   Sodium 120 (L) 135 - 145 mmol/L   Potassium 3.8 3.5 - 5.1 mmol/L   Chloride 83 (L) 98 - 111 mmol/L   CO2 24 22 - 32 mmol/L   Glucose, Bld 134 (H) 70 - 99 mg/dL    Comment: Glucose reference range applies only to samples taken after fasting for at least 8 hours.   BUN 27 (H) 8 - 23 mg/dL   Creatinine, Ser 0.86 (H) 0.44 - 1.00 mg/dL   Calcium  8.0 (L) 8.9 - 10.3 mg/dL   GFR, Estimated 39 (L)  >60 mL/min    Comment: (NOTE) Calculated using the CKD-EPI Creatinine Equation (2021)    Anion gap 13 5 - 15    Comment: Performed at [facility], [address], [place], [place] [address]  Basic metabolic panel     Status: Abnormal   Collection Time: 02/26/24 10:00 PM  Result Value Ref Range   Sodium 122 (L) 135 - 145 mmol/L   Potassium 4.2 3.5 - 5.1 mmol/L   Chloride 86 (L) 98 - 111 mmol/L   CO2 25 22 - 32 mmol/L   Glucose, Bld 119 (H) 70 - 99 mg/dL    Comment: Glucose reference range applies only to samples taken after fasting for at least 8 hours.   BUN 32 (H) 8 - 23 mg/dL   Creatinine, Ser 9.62 (H) 0.44 - 1.00 mg/dL   Calcium  8.1 (L) 8.9 - 10.3 mg/dL   GFR, Estimated 43 (L) >60 mL/min    Comment: (NOTE) Calculated using the CKD-EPI Creatinine Equation (2021)    Anion gap 11 5 - 15    Comment: Performed at [facility], [address] [place], [place] [address]  Cooxemetry Panel (carboxy, met, total hgb, O2 sat)     Status: Abnormal   Collection Time: 02/27/24  5:50 AM  Result Value Ref Range   Total hemoglobin 8.8 (L) 12.0 - 16.0 g/dL   O2 Saturation 13.2 %   Carboxyhemoglobin 2.3 (H) 0.5 - 1.5 %   Methemoglobin <0.7 0.0 - 1.5 %    Comment: Performed at [facility], [address], [place], [place] [address]  Basic metabolic panel     Status: Abnormal   Collection Time: 02/27/24  7:55 AM  Result Value Ref Range   Sodium 123 (L) 135 - 145 mmol/L   Potassium 4.1 3.5 - 5.1 mmol/L   Chloride  85 (L) 98 - 111 mmol/L   CO2 26 22 - 32 mmol/L   Glucose, Bld 118 (H) 70 - 99 mg/dL    Comment: Glucose reference range applies only to samples taken after fasting for at least 8 hours.   BUN 52 (H) 8 - 23 mg/dL   Creatinine, Ser 1.61 (H) 0.44 - 1.00 mg/dL   Calcium  8.5 (L) 8.9 - 10.3 mg/dL   GFR, Estimated 44 (L) >60 mL/min    Comment: (NOTE) Calculated using the CKD-EPI Creatinine Equation (2021)    Anion gap 12 5 - 15    Comment: Performed at [facility], [address], [place], [place] [address]  Magnesium      Status: None   Collection Time: 02/27/24  7:55 AM  Result Value Ref Range   Magnesium  1.8 1.7 - 2.4 mg/dL    Comment: Performed at [facility], [address], [place], [place] [address]  Basic metabolic panel     Status: Abnormal   Collection Time: 02/27/24  5:11 PM  Result Value Ref Range   Sodium 121 (L) 135 - 145 mmol/L   Potassium 3.7 3.5 - 5.1 mmol/L   Chloride 84 (L) 98 - 111 mmol/L   CO2 26 22 - 32 mmol/L   Glucose, Bld 123 (H) 70 - 99 mg/dL    Comment: Glucose reference range applies only to samples taken after fasting for at least 8 hours.   BUN 46 (H) 8 - 23 mg/dL   Creatinine, Ser 1.19 (H) 0.44 - 1.00 mg/dL   Calcium  8.2 (L) 8.9 - 10.3 mg/dL   GFR, Estimated 40 (L) >60 mL/min    Comment: (NOTE) Calculated using the CKD-EPI Creatinine Equation (2021)    Anion gap 11 5 - 15    Comment: Performed at [facility], [address], [place], [place] [address]  Cooxemetry Panel (carboxy, met, total hgb, O2 sat)     Status: Abnormal   Collection Time: 02/28/24  4:47 AM  Result Value Ref Range   Total hemoglobin 8.4 (L) 12.0 - 16.0 g/dL   O2 Saturation 95.6 %   Carboxyhemoglobin 2.1 (H) 0.5 - 1.5 %   Methemoglobin 1.1 0.0 - 1.5 %    Comment: Performed at [facility], [address] [place], [place] [address]  Basic metabolic panel     Status: Abnormal   Collection Time: 02/28/24  4:47 AM  Result Value Ref Range   Sodium 124 (L) 135 - 145 mmol/L   Potassium 3.4 (L) 3.5 - 5.1 mmol/L   Chloride 87 (L) 98 - 111 mmol/L   CO2 27 22 - 32 mmol/L   Glucose, Bld 102 (H) 70 - 99 mg/dL    Comment: Glucose reference range applies only to samples taken after fasting for at least 8 hours.   BUN 60 (H) 8 - 23 mg/dL   Creatinine, Ser 6.57 (H) 0.44 - 1.00 mg/dL   Calcium  7.9 (L) 8.9 - 10.3 mg/dL   GFR, Estimated 38 (L) >60 mL/min    Comment: (NOTE) Calculated using the CKD-EPI Creatinine Equation  (2021)    Anion gap 10 5 - 15    Comment: Performed at [facility], [address] [place], [place] [address]  Magnesium      Status: None   Collection Time: 02/28/24  4:47 AM  Result Value Ref Range   Magnesium  2.3 1.7 - 2.4 mg/dL    Comment: Performed at [facility], [address] [place], [place] [address]   IR THORACENTESIS ASP PLEURAL SPACE W/IMG GUIDE Result Date: 02/27/2024 INDICATION: 83 year old female status post Maze procedure, mitral valve repair, with new onset pleural effusion. IR was requested for therapeutic thoracentesis. EXAM: ULTRASOUND GUIDED THERAPEUTIC THORACENTESIS MEDICATIONS: 5 cc of 1% lidocaine  COMPLICATIONS: None immediate. PROCEDURE: An ultrasound guided thoracentesis was thoroughly discussed with the patient and questions answered. The benefits, risks, alternatives and complications were also discussed. The patient understands and wishes to proceed with the procedure. Written consent was obtained. Ultrasound was performed to localize and mark an adequate pocket of fluid in the left chest. The area was then prepped and draped in the normal sterile fashion. 1% Lidocaine  was used for local anesthesia. Under ultrasound guidance a 6 Fr Safe-T-Centesis catheter was introduced. Thoracentesis was performed. The catheter was removed and a dressing applied. FINDINGS: A total of approximately 1.2 L of clear, blood-tinged pleural fluid was removed. IMPRESSION: Successful ultrasound guided left thoracentesis yielding 1.2 L of pleural fluid. Procedure performed by Lambert Pillion, PA-C Electronically Signed   By: Nicoletta Barrier M.D.   On: 02/27/2024 12:05   DG Chest 1 View Result Date: 02/27/2024 CLINICAL DATA:  Post thoracentesis on the left. EXAM: CHEST  1 VIEW COMPARISON:  Radiographs 02/26/2024 and 02/22/2024. Chest CT 01/30/2024. FINDINGS: 0919 hours. Interval decreased left pleural effusion with improved aeration of the left lung. There are small residual bilateral  pleural effusions with mild bibasilar atelectasis. No evidence of pneumothorax. A skin fold overlies the left chest. Right arm PICC projects to the lower SVC level. The heart size and mediastinal contours are stable post median sternotomy, mitral valve repair and left atrial appendage clipping. No acute osseous findings are seen. IMPRESSION: Interval decreased left pleural effusion with improved aeration of the left lung. No evidence of pneumothorax. Electronically Signed   By: Elmon Hagedorn M.D.   On: 02/27/2024 09:48   DG Chest 2 View Result Date: 02/26/2024 CLINICAL DATA:  Follow-up pleural effusion EXAM: CHEST - 2 VIEW COMPARISON:  02/22/2024 FINDINGS: Right-sided PICC is again noted and stable. Cardiac shadow is mildly prominent accentuated by the portable technique. Postsurgical changes are again seen. Increasing consolidation in the left base is noted with associated small effusion. Stable blunting of the right costophrenic angle is noted as well. IMPRESSION: Increasing left basilar consolidation with effusion. Electronically Signed   By: Violeta Grey M.D.   On: 02/26/2024 11:05    PMH:   Past Medical History:  Diagnosis Date   Acid reflux    Anemia    Arthritis    Atrial fibrillation (HCC)    Cataract    Diverticulosis of colon    Endometrial cancer (HCC)    1A grade endometrial ca   Essential hypertension 01/24/2009   Qualifier: Diagnosis of  By: Nelson-Smith CMA (AAMA), Dottie     Hx of radiation therapy 04/24/08-05/29/08& 8/12/,8/19,&06/27/08   external beam  through 7/09 ,then intracavity in 06/2008   Hypertension    Mitral valve prolapse    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   Tricuspid regurgitation     PSH:   Past Surgical History:  Procedure Laterality Date   APPENDECTOMY     1967   arthroscopic knee right surgery     CLIPPING OF ATRIAL APPENDAGE N/A 02/08/2024   Procedure: CLIPPING, LEFT ATRIAL APPENDAGE USING A MEDTRONIC CLIP;  Surgeon: Melene Sportsman, MD;   Location: [facility] OR;  Service: Open Heart Surgery;  Laterality: N/A;  ECTOPIC PREGNANCY SURGERY  1967   IR THORACENTESIS ASP PLEURAL SPACE W/IMG GUIDE  02/27/2024   iron  infusion  12/2022   LAPAROSCOPIC HYSTERECTOMY     total   MAZE N/A 02/08/2024   Procedure: MAZE PROCEDURE;  Surgeon: Melene Sportsman, MD;  Location: [facility] OR;  Service: Open Heart Surgery;  Laterality: N/A;   MITRAL VALVE REPAIR N/A 02/08/2024   Procedure: MITRAL VALVE REPAIR USING A SIMULUS SEMI-RIGID ANNULOPLASTY BAND;  Surgeon: Melene Sportsman, MD;  Location: [facility] OR;  Service: Open Heart Surgery;  Laterality: N/A;  possible replacement   ORTHOPEDIC SURGERY     Orthoscopic Knee Surgery   RIGHT/LEFT HEART CATH AND CORONARY ANGIOGRAPHY N/A 12/30/2023   Procedure: RIGHT/LEFT HEART CATH AND CORONARY ANGIOGRAPHY;  Surgeon: [name], MD;  Location: [facility] INVASIVE CV LAB;  Service: Cardiovascular;  Laterality: N/A;   TEAR DUCT PROBING     TEE WITHOUT CARDIOVERSION N/A 02/08/2024   Procedure: ECHOCARDIOGRAM, TRANSESOPHAGEAL;  Surgeon: Melene Sportsman, MD;  Location: [facility] OR;  Service: Open Heart Surgery;  Laterality: N/A;   TOOTH EXTRACTION     bone graft   TRANSESOPHAGEAL ECHOCARDIOGRAM (CATH LAB) N/A 12/30/2023   Procedure: TRANSESOPHAGEAL ECHOCARDIOGRAM;  Surgeon: Euell Herrlich, MD;  Location: [facility] INVASIVE CV LAB;  Service: Cardiovascular;  Laterality: N/A;    Allergies:  Allergies  Allergen Reactions   Crestor [Rosuvastatin] Other (See Comments)    Leg cramps   Glycopyrrolate      Other reaction(s): extreme dry mouth   Codeine Other (See Comments)    Hyper    Medications:   Prior to Admission medications   Medication Sig Start Date End Date Taking? Authorizing Provider  amiodarone  (PACERONE ) 200 MG tablet Take 1 tablet (200 mg total) by mouth daily. 01/13/24  Yes Meng, Hao, PA  apixaban  (ELIQUIS ) 5 MG TABS tablet Take 1 tablet (5 mg total) by mouth 2 (two) times daily. 01/26/24  Yes [name], MD  b complex vitamins tablet Take 1  tablet by mouth daily.     Yes [provider]  Bempedoic Acid -Ezetimibe  (NEXLIZET) 180-10 MG TABS Take 1 tablet by mouth daily.   Yes [provider]  CALCIUM  PO Take 650 mg by mouth daily.   Yes [provider]  Cholecalciferol (VITAMIN D3) 125 MCG (5000 UT) CAPS Take 5,000 Units by mouth daily.   Yes [provider]  denosumab  (PROLIA ) 60 MG/ML SOSY injection Inject 60 mg into the skin every 6 (six) months.   Yes [provider]  furosemide  (LASIX ) 20 MG tablet Take 1 tablet (20 mg total) by mouth daily. 01/12/24 01/11/25 Yes Melene Sportsman, MD  Garlic 1000 MG CAPS Take 1,000 mg by mouth daily.   Yes [provider]  Joneen Nelson Omega-3 300 MG CAPS Take 300 mg by mouth daily.   Yes [provider]  loperamide  (IMODIUM ) 2 MG capsule Take 1 capsule (2 mg total) by mouth 4 (four) times daily as needed for diarrhea or loose stools. 01/10/21  Yes Trish Furl, MD  metoprolol  succinate (TOPROL -XL) 25 MG 24 hr tablet Take 25 mg by mouth 2 (two) times daily.  Take additional tablet for palpitations   Yes [provider]  Multiple Vitamins-Minerals (MULTIVITAMINS THER. W/MINERALS) TABS Take 1 tablet by mouth daily.     Yes [provider]  pantoprazole  (PROTONIX ) 40 MG tablet Take 40 mg by mouth daily.   Yes [provider]  potassium chloride  SA (KLOR-CON  M) 20 MEQ tablet Take 0.5 tablets (10  mEq total) by mouth daily. 01/12/24  Yes Melene Sportsman, MD  Zinc 50 MG TABS Take 50 mg by mouth daily.   Yes [provider]  apixaban  (ELIQUIS ) 5 MG TABS tablet Take 1 tablet (5 mg total) by mouth 2 (two) times daily. 01/31/24   [name], MD  atorvastatin  (LIPITOR) 10 MG tablet Take 1 tablet (10 mg total) by mouth daily. Patient not taking: Reported on 01/25/2024 12/20/23 03/19/24  [name], MD  ezetimibe  (ZETIA ) 10 MG tablet Take 1 tablet (10 mg total) by mouth daily. Patient not taking: Reported on 01/25/2024 12/20/23 03/19/24   [name], MD    Discontinued Meds:   Medications Discontinued During This Encounter  Medication Reason   EPINEPHrine  (ADRENALIN ) 5 mg in NS 250 mL (0.02 mg/mL) premix infusion    milrinone  (PRIMACOR ) 20 MG/100 ML (0.2 mg/mL) infusion    nitroGLYCERIN  50 mg in dextrose  5 % 250 mL (0.2 mg/mL) infusion    norepinephrine  (LEVOPHED ) 4mg  in (0.016 mg/mL) premix infusion    potassium chloride  injection 80 mEq    heparin  30,000 units/NS 1000 mL solution for CELLSAVER    heparin  sodium (porcine) 2,500 Units, papaverine 30 mg in electrolyte-A (PLASMALYTE-A PH 7.4) 500 mL irrigation    tranexamic acid  (CYKLOKAPRON ) pump prime solution 137 mg    vancomycin  (VANCOCIN ) 1,000 mg in sodium chloride  0.9 % 1,000 mL irrigation    [facility] Blood Cardioplegia vial (lidocaine /magnesium /mannitol  0.26g-4g-6.4g)    [facility] Blood Cardioplegia vial (lidocaine /magnesium /mannitol  0.26g-4g-6.4g)    [facility], Patient & Family Education    metoprolol  tartrate (LOPRESSOR ) tablet 12.5 mg    chlorhexidine  (HIBICLENS ) 4 % liquid 2 Application    0.9 % irrigation (POUR BTL)    vancomycin  (VANCOCIN ) 1,000 mg in sodium chloride  0.9 % 1,000 mL irrigation    Surgifoam 1 Gm with 0.9% sodium chloride  (4 ml) topical solution Patient Discharge   Bempedoic Acid -Ezetimibe  180-10 MG TABS 1 tablet Not available   sodium chloride  flush (NS) 0.9 % injection 3-10 mL    sodium chloride  flush (NS) 0.9 % injection 3-10 mL    sodium chloride  flush (NS) 0.9 % injection 3-10 mL    sodium chloride  flush (NS) 0.9 % injection 3-10 mL    sodium chloride  flush (NS) 0.9 % injection 3-10 mL    sodium chloride  flush (NS) 0.9 % injection 3-10 mL    acetaminophen  (TYLENOL ) 160 MG/5ML solution 1,000 mg Change in therapy   aspirin  chewable tablet 324 mg Change in therapy   metoprolol  tartrate (LOPRESSOR ) 25 mg/10 mL oral suspension 12.5 mg Change in therapy   0.45 % sodium chloride  infusion Change in therapy    acetaminophen  (TYLENOL ) 160 MG/5ML solution 650 mg    Oral care mouth rinse    Oral care mouth rinse    midazolam  (VERSED ) injection 2 mg    dexmedetomidine  (PRECEDEX ) 400 MCG/100ML (4 mcg/mL) infusion    nitroGLYCERIN  50 mg in dextrose  5 % 250 mL (0.2 mg/mL) infusion    amiodarone  (PACERONE ) tablet 400 mg    EPINEPHrine  (ADRENALIN ) 5 mg in NS 250 mL (0.02 mg/mL) premix infusion    potassium chloride  SA (KLOR-CON  M) CR tablet 20 mEq    amiodarone  (NEXTERONE ) 1.5 mg/mL IV bolus only 150 mg Entry Error   insulin  regular, human (MYXREDLIN ) 100 units/ 100 mL infusion    enoxaparin  (LOVENOX ) injection 30 mg    phenylephrine  (NEO-SYNEPHRINE) 20mg /NS 250mL premix infusion    metoprolol  tartrate (LOPRESSOR ) tablet 12.5 mg  aspirin  EC tablet 325 mg    vasopressin  (PITRESSIN) 20 Units in 100 mL (0.2 unit/mL) infusion-*FOR SHOCK*    amiodarone  (NEXTERONE  PREMIX) 360-4.14 MG/200ML-% (1.8 mg/mL) IV infusion    norepinephrine  (LEVOPHED ) 16 mg in (0.064 mg/mL) premix infusion    heparin  ADULT infusion 100 units/mL (25000 units/250mL)    amiodarone  (NEXTERONE ) 1.5 mg/mL IV bolus only 150 mg    apixaban  (ELIQUIS ) tablet 2.5 mg    amiodarone  (NEXTERONE ) 1.8 mg/mL load via infusion 150 mg    scopolamine  (TRANSDERM-SCOP) 1 MG/3DAYS 1.5 mg    amiodarone  (PACERONE ) tablet 400 mg Duplicate   furosemide  (LASIX ) injection 40 mg    amiodarone  (PACERONE ) tablet 400 mg    amiodarone  (NEXTERONE ) 1.5 mg/mL IV bolus only 150 mg    potassium chloride  10 mEq in 50 mL *CENTRAL LINE* IVPB    docusate sodium  (COLACE) capsule 200 mg    bisacodyl  (DULCOLAX) EC tablet 10 mg    bisacodyl  (DULCOLAX) suppository 10 mg    amiodarone  (NEXTERONE ) 1.5 mg/mL IV bolus only 150 mg    senna (SENOKOT) tablet 8.6 mg    potassium chloride  SA (KLOR-CON  M) CR tablet 40 mEq    ondansetron  (ZOFRAN ) injection 4 mg    digoxin  (LANOXIN ) 0.1 MG/ML injection 0.5 mg Change in therapy   digoxin  (LANOXIN ) 0.1 MG/ML injection 0.25 mg  Change in therapy   bethanechol  (URECHOLINE ) tablet 5 mg Discontinued by provider   potassium chloride  (KLOR-CON ) packet 40 mEq    potassium chloride  10 mEq in 50 mL *CENTRAL LINE* IVPB    loperamide  HCl (IMODIUM ) 1 MG/7.5ML suspension 2 mg    potassium chloride  SA (KLOR-CON  M) CR tablet 40 mEq    potassium chloride  (KLOR-CON ) packet 40 mEq    potassium chloride  (KLOR-CON ) packet 40 mEq Duplicate   aspirin  chewable tablet 81 mg    traMADol  (ULTRAM ) tablet 50 mg    sodium chloride  flush (NS) 0.9 % injection 3 mL    sodium chloride  flush (NS) 0.9 % injection 10-40 mL    fentaNYL  (SUBLIMAZE ) injection 100 mcg    sodium chloride  flush (NS) 0.9 % injection 3-10 mL    sodium chloride  flush (NS) 0.9 % injection 10-40 mL    potassium chloride  SA (KLOR-CON  M) CR tablet 40 mEq    potassium chloride  (KLOR-CON ) packet 20 mEq    potassium chloride  10 mEq in 50 mL *CENTRAL LINE* IVPB    metoprolol  tartrate (LOPRESSOR ) injection 2.5-5 mg    digoxin  (LANOXIN ) tablet 0.125 mg    digoxin  (LANOXIN ) tablet 0.0625 mg    furosemide  (LASIX ) 200 mg in dextrose  5 % 100 mL (2 mg/mL) infusion    potassium chloride  SA (KLOR-CON  M) CR tablet 40 mEq    potassium chloride  SA (KLOR-CON  M) CR tablet 40 mEq    aspirin  EC tablet 81 mg    milrinone  (PRIMACOR ) 20 MG/100 ML (0.2 mg/mL) infusion    traMADol  (ULTRAM ) tablet 50 mg    amiodarone  (NEXTERONE  PREMIX) 360-4.14 MG/200ML-% (1.8 mg/mL) IV infusion    morphine  (PF) 2 MG/ML injection 1-4 mg    potassium chloride  SA (KLOR-CON  M) CR tablet 20 mEq    potassium chloride  (KLOR-CON  M) CR tablet 30 mEq    spironolactone  (ALDACTONE ) tablet 12.5 mg    potassium chloride  (KLOR-CON  M) CR tablet 30 mEq    amiodarone  (PACERONE ) tablet 400 mg    amiodarone  (PACERONE ) tablet 200 mg    dextrose  50 % solution 0-50 mL    insulin  aspart (  novoLOG ) injection 2-6 Units    feeding supplement (BOOST / RESOURCE BREEZE) liquid 1 Container    sodium chloride  flush (NS) 0.9 % injection 3  mL    sodium chloride  flush (NS) 0.9 % injection 3 mL    0.9 %  sodium chloride  infusion    amiodarone  (PACERONE ) tablet 200 mg    digoxin  (LANOXIN ) tablet 0.0625 mg    torsemide  (DEMADEX ) tablet 40 mg    potassium chloride  SA (KLOR-CON  M) CR tablet 40 mEq    torsemide  (DEMADEX ) tablet 40 mg    urea (URE-NA) oral packet 15 g    furosemide  (LASIX ) injection 80 mg    potassium chloride  SA (KLOR-CON  M) CR tablet 20 mEq    potassium chloride  SA (KLOR-CON  M) CR tablet 20 mEq     Social History:  reports that she has never smoked. She has never used smokeless tobacco. She reports current alcohol use. She reports that she does not use drugs.  Family History:   Family History  Problem Relation Age of Onset   Diabetes type II Mother    Breast cancer Mother    Leukemia Father    Diabetes type II Sister    Breast cancer Sister    Cancer Brother 45       throat 10/2020   Pancreatic cancer Maternal Aunt        x2   Diabetes Maternal Grandmother    Diabetes Maternal Grandfather    Colon cancer Neg Hx    Stomach cancer Neg Hx    Esophageal cancer Neg Hx     Blood pressure (!) 104/55, pulse 72, temperature 97.7 F (36.5 C), temperature source Oral, resp. rate 12, height 5\' 4"  (1.626 m), weight 72.1 kg, SpO2 98%. Physical Exam: General: NAD HEENT: anicteric sclera CV: Normal rate, no rub, 2+ pitting edema in the bilateral lower extremities, UNNA boots Lungs: Mild increased work of breathing, trace rales Abd: SNDNT +BS Skin: no visible lesions or rashes Musculoskeletal: no obvious deformities Neuro: normal speech, no gross focal deficits      Salle Brandle, Alveda Aures, MD 02/28/2024, 8:48 AM

## 2024-02-28 NOTE — Progress Notes (Signed)
 Mobility Specialist Progress Note:   02/28/24 1127  Mobility  Activity Transferred to/from [facility]  Level of Assistance Minimal assist, patient does 75% or more  Assistive Device None  Distance Ambulated (ft) 5 ft  RUE Weight Bearing Per Provider Order NWB  LUE Weight Bearing Per Provider Order NWB  Activity Response Tolerated well  Mobility Referral Yes  Mobility visit 1 Mobility  Mobility Specialist Start Time (ACUTE ONLY) 1120  Mobility Specialist Stop Time (ACUTE ONLY) 1125  Mobility Specialist Time Calculation (min) (ACUTE ONLY) 5 min   Pt requested assistance to transfer BSC>B. MinA required, no AD. Tolerated well, asx throughout. Left pt in bed with all needs met, call bell in reach. Husband at bedside. Bed alarm on.    [name] Mobility Specialist Please contact via Special educational needs teacher or  Rehab office at [phone]

## 2024-02-28 NOTE — Progress Notes (Addendum)
      [address]       [place] [address]             [phone]        20 Days Post-Op Procedure(s) (LRB): MITRAL VALVE REPAIR USING A SIMULUS SEMI-RIGID ANNULOPLASTY BAND (N/A) MAZE PROCEDURE (N/A) CLIPPING, LEFT ATRIAL APPENDAGE USING A MEDTRONIC CLIP (N/A) ECHOCARDIOGRAM, TRANSESOPHAGEAL (N/A)  Subjective: Patient sleeping and just awakened this am. She states her breathing is much better.  Objective: Vital signs in last 24 hours: Temp:  [97.5 F (36.4 C)-98 F (36.7 C)] 97.7 F (36.5 C) (04/29 0307) Pulse Rate:  [70-81] 72 (04/29 0436) Cardiac Rhythm: Normal sinus rhythm (04/28 1923) Resp:  [12-22] 12 (04/29 0436) BP: (85-108)/(37-56) 104/55 (04/29 0307) SpO2:  [95 %-98 %] 98 % (04/29 0436) Weight:  [72.1 kg] 72.1 kg (04/29 0307)  Pre op weight 68.5 kg Current Weight  02/28/24 72.1 kg    Hemodynamic parameters for last 24 hours: CVP:  [6 mmHg-15 mmHg] 9 mmHg  Intake/Output from previous day: 04/28 0701 - 04/29 0700 In: 586 [P.O.:390; I.V.:40; IV Piggyback:66] Out: 450 [Urine:450]   Physical Exam:  Cardiovascular: RRR Pulmonary: Clear to auscultation on right and slightly diminished left basilar Abdomen: Soft, non tender, bowel sounds present. Extremities: Unna boots Wound: Clean and dry.  No erythema or signs of infection.  Lab Results: CBC:No results for input(s): "WBC", "HGB", "HCT", "PLT" in the last 72 hours. BMET:  Recent Labs    02/27/24 1711 02/28/24 0447  NA 121* 124*  K 3.7 3.4*  CL 84* 87*  CO2 26 27  GLUCOSE 123* 102*  BUN 46* 60*  CREATININE 1.35* 1.41*  CALCIUM  8.2* 7.9*    PT/INR:  Lab Results  Component Value Date   INR 1.6 (H) 02/08/2024   INR 1.0 02/07/2024   ABG:  INR: Will add last result for INR, ABG once components are confirmed Will add last 4 CBG results once components are confirmed  Assessment/Plan: CV-Has had persistent fib with RVR. S/p DCCV 04/18 (failed).  SR with HR in  the 70's this am.  Co ox this am 82.9.  On Amiodarone  400 mg bid,  Losartan  12.5 mg daily,  and Apixaban  5 mg bid.  Pulmonary-On room air. S/p left thoracentesis 1.2 L removed 04/28. Encourage incentive spirometer. Acute systolic heart failure, non ischemic cardiomyopathy-on Spironolactone  25 mg daily, Farxiga 10 mg daily, and Unna boots. CVP 9 this am.  AHF following Expected post op blood loss anemia-Last H and H 8.3 and 25.4 5. Hypervolemic hyponatremia-sodium this am 124 (has been 123-124 mostly of late).  6. Non oliguric AKI-Creatinine this am 1.41. Creatinine prior to admission 1.15. 7. Supplement potassium 8. Deconditioned-continue PT. When ready for discharge, and per CIR note yesterday, home health recommended not CIR  Donielle M ZimmermanPA-C 6:58 AM  Agree with above and AHF moving to po diuretics. Cr up and NA still low. Hopefully home next 24-48 hrs

## 2024-02-28 NOTE — Progress Notes (Signed)
 Physical Therapy Treatment Patient Details Name: [name] MRN: [id number] DOB: May 02, 1941 Today's Date: 02/28/2024   History of Present Illness 83 y.o. female presents to [facility] 02/08/24 for complex MVR and MAZE (via sternotomy). Post-op complicated by persistent a fib RVR and volume overload. Cardioversion x2 4/18 without resolution of afib.  4/21 returned to [facility]. 4/9-4/23 ICU stay. Plan for repeat DCCV 4/25. PMHx: anemia, HTN, mitral valve prolapse, moderate mitral regurgitation, tricuspid regurgitation    PT Comments  Patient up in recliner on arrival. Continues to need cues for sequencing for sit to stand. Assisted to [facility] and then ready for stair training with her spouse. Husband provided min assist as she stepped up/down single step x 3 reps. Afterward, pt ambulated with incr distance with encouragement. Returned to sit EOB and required min assist to stand-pivot to recliner due to fatigue. OT present at end of session.    If plan is discharge home, recommend the following: A little help with walking and/or transfers;A little help with bathing/dressing/bathroom;Assistance with cooking/housework;Assist for transportation;Help with stairs or ramp for entrance   Can travel by private vehicle        Equipment Recommendations  None recommended by PT    Recommendations for Other Services       Precautions / Restrictions Precautions Precautions: Fall;Sternal Precaution Booklet Issued: Yes (comment) Recall of Precautions/Restrictions: Impaired Precaution/Restrictions Comments: Watch HR, needs cues for sternal precautions when going to sit down Restrictions Other Position/Activity Restrictions: sternal     Mobility  Bed Mobility               General bed mobility comments: up in recliner    Transfers Overall transfer level: Needs assistance Equipment used: Rolling walker (2 wheels) (heart pillow) Transfers: Sit to/from Stand Sit to Stand: Contact guard assist, Min assist  (with use of heart pillow)   Step pivot transfers: Contact guard assist       General transfer comment: Pt initially required CGA for boost up from recliner and BSC. vc for leaning forward over BOS; after stairs and walking required min assist to boost to standing    Ambulation/Gait Ambulation/Gait assistance: Contact guard assist Gait Distance (Feet): 180 Feet Assistive device: Rolling walker (2 wheels) Gait Pattern/deviations: Step-through pattern, Decreased stride length Gait velocity: decr     General Gait Details: Good maneuvering of RW;   Stairs   Stairs assistance: Min assist Stair Management: Forwards Number of Stairs: 3 General stair comments: husband provided HHA on her left with PT only providing CGA; single step x 3 reps   Wheelchair Mobility     Tilt Bed    Modified Rankin (Stroke Patients Only)       Balance Overall balance assessment: Needs assistance, Mild deficits observed, not formally tested Sitting-balance support: No upper extremity supported, Feet supported Sitting balance-Leahy Scale: Fair     Standing balance support: During functional activity, No upper extremity supported Standing balance-Leahy Scale: Fair Standing balance comment: able to stand statically; RW for dynamic standing during tasks and for functional transfers/mobility                            Communication Communication Communication: Impaired Factors Affecting Communication: Hearing impaired (has hearing aids)  Cognition Arousal: Alert Behavior During Therapy: WFL for tasks assessed/performed, Flat affect   PT - Cognitive impairments: Memory  PT - Cognition Comments: continues to require cues for scooting forward prior to attempt to stand, leaning forward over BOS with sit to stand, and removing hands from RW when goes to sit Following commands: Intact      Cueing Cueing Techniques: Verbal cues  Exercises      General  Comments General comments (skin integrity, edema, etc.): Husband present and able to assist pt up/down steps. Reported he found a pack of briefs/depends at home and is looking for the same brand to get her some more.      Pertinent Vitals/Pain Pain Assessment Pain Assessment: No/denies pain    Home Living                          Prior Function            PT Goals (current goals can now be found in the care plan section) Acute Rehab PT Goals Patient Stated Goal: to feel better PT Goal Formulation: With patient/family Time For Goal Achievement: 03/08/24 Potential to Achieve Goals: Good Progress towards PT goals: Progressing toward goals    Frequency    Min 2X/week      PT Plan      Co-evaluation              AM-PAC PT "6 Clicks" Mobility   Outcome Measure  Help needed turning from your back to your side while in a flat bed without using bedrails?: A Little Help needed moving from lying on your back to sitting on the side of a flat bed without using bedrails?: A Little Help needed moving to and from a bed to a chair (including a wheelchair)?: A Little Help needed standing up from a chair using your arms (e.g., wheelchair or bedside chair)?: A Little Help needed to walk in hospital room?: A Little Help needed climbing 3-5 steps with a railing? : A Little 6 Click Score: 18    End of Session Equipment Utilized During Treatment: Gait belt Activity Tolerance: Patient limited by fatigue Patient left: with family/visitor present;in chair;Other (comment) (OT present) Nurse Communication: Mobility status PT Visit Diagnosis: Unsteadiness on feet (R26.81);Muscle weakness (generalized) (M62.81);Other abnormalities of gait and mobility (R26.89)     Time: 1610-9604 PT Time Calculation (min) (ACUTE ONLY): 30 min  Charges:    $Gait Training: 23-37 mins PT General Charges $$ ACUTE PT VISIT: 1 Visit                      Gayle Kava, PT Acute Rehabilitation  Services  Office [phone]    Guilford Leep 02/28/2024, 11:03 AM

## 2024-02-28 NOTE — Progress Notes (Signed)
 Occupational Therapy Treatment Patient Details Name: [name] MRN: [id number] DOB: 10-03-1941 Today's Date: 02/28/2024   History of present illness 83 y.o. female presents to [facility] 02/08/24 for complex MVR and MAZE (via sternotomy). Post-op complicated by persistent a fib RVR and volume overload. Cardioversion x2 4/18 without resolution of afib.  4/21 returned to [facility]. 4/9-4/23 ICU stay. Plan for repeat DCCV 4/25. PMHx: anemia, HTN, mitral valve prolapse, moderate mitral regurgitation, tricuspid regurgitation   OT comments  Pt received in recliner finishing up with PT session, agreeable to OT. Pt requesting bathing; provided with set up of water basin with soap and washcloths, pt able to wash face and upper body with supervision. Assistance provided to wash back and legs to adhere to precautions. Pt completed UB/LB dressing while seated and required min-max assist. Pt performed well throughout session and mainly required assist with tasks in order to adhere to precautions. The rest of pts self care and grooming done seated in recliner with varying assistance from set up to max. Pt left in recliner with all needs met and husband plus NT present. Discussed with husband use of a BSC d/t difficulty making it to the bathroom before voiding. Acute OT to continue to follow to address established goals to facilitate DC to next venue of care.        If plan is discharge home, recommend the following:  A little help with walking and/or transfers;A lot of help with bathing/dressing/bathroom;Assistance with cooking/housework;Assist for transportation;Help with stairs or ramp for entrance   Equipment Recommendations  BSC/3in1    Recommendations for Other Services      Precautions / Restrictions Precautions Precautions: Fall;Sternal Precaution Booklet Issued: Yes (comment) Recall of Precautions/Restrictions: Impaired Precaution/Restrictions Comments: min cues to adghere sternal  precautions Restrictions Weight Bearing Restrictions Per Provider Order: Yes RUE Weight Bearing Per Provider Order: Non weight bearing LUE Weight Bearing Per Provider Order: Non weight bearing       Mobility Bed Mobility                    Transfers                         Balance Overall balance assessment: Needs assistance Sitting-balance support: No upper extremity supported, Feet supported Sitting balance-Leahy Scale: Fair Sitting balance - Comments: in recliner                                   ADL either performed or assessed with clinical judgement   ADL Overall ADL's : Needs assistance/impaired     Grooming: Oral care;Wash/dry face;Applying deodorant;Brushing hair;Set up;Maximal assistance;Sitting Grooming Details (indicate cue type and reason): completed in recliner, max assist for tasks that require lifting overhead/lifting arms outward Upper Body Bathing: Set up;Supervision/ safety;Sitting Upper Body Bathing Details (indicate cue type and reason): done in recliner Lower Body Bathing: Set up;Supervison/ safety;Sitting/lateral leans Lower Body Bathing Details (indicate cue type and reason): done in recliner Upper Body Dressing : Minimal assistance;Sitting Upper Body Dressing Details (indicate cue type and reason): to don gown Lower Body Dressing: Sitting/lateral leans;Maximal assistance Lower Body Dressing Details (indicate cue type and reason): to change socks               General ADL Comments: pt completing tasks while seated in recliner, slight fatigue from previous session with PT    Extremity/Trunk Assessment  Vision       Perception     Praxis     Communication Communication Communication: Impaired Factors Affecting Communication: Hearing impaired   Cognition Arousal: Alert Behavior During Therapy: WFL for tasks assessed/performed, Flat affect Cognition: No apparent impairments                                Following commands: Intact        Cueing   Cueing Techniques: Verbal cues  Exercises      Shoulder Instructions       General Comments VSS    Pertinent Vitals/ Pain       Pain Assessment Pain Assessment: No/denies pain Pain Intervention(s): Monitored during session  Home Living                                          Prior Functioning/Environment              Frequency  Min 2X/week        Progress Toward Goals  OT Goals(current goals can now be found in the care plan section)  Progress towards OT goals: Progressing toward goals  Acute Rehab OT Goals Patient Stated Goal: none stated OT Goal Formulation: With patient Time For Goal Achievement: 03/09/24 Potential to Achieve Goals: Good ADL Goals Pt Will Perform Grooming: with supervision;standing Pt Will Perform Lower Body Bathing: with contact guard assist;sitting/lateral leans;sit to/from stand Pt Will Perform Upper Body Dressing: with supervision;sitting Pt Will Perform Lower Body Dressing: with contact guard assist;sitting/lateral leans;sit to/from stand Pt Will Transfer to Toilet: with supervision;ambulating;regular height toilet Pt Will Perform Toileting - Clothing Manipulation and hygiene: with supervision;sitting/lateral leans;sit to/from stand Additional ADL Goal #1: Patient will demonstrate ability to Independently state 4 energy conservation strategies to increase safety and independence with functional tasks with handout provided.  Plan      Co-evaluation                 AM-PAC OT "6 Clicks" Daily Activity     Outcome Measure   Help from another person eating meals?: A Little Help from another person taking care of personal grooming?: A Little Help from another person toileting, which includes using toliet, bedpan, or urinal?: A Lot Help from another person bathing (including washing, rinsing, drying)?: A Lot Help from another person  to put on and taking off regular upper body clothing?: A Little Help from another person to put on and taking off regular lower body clothing?: A Lot 6 Click Score: 15    End of Session    OT Visit Diagnosis: Other abnormalities of gait and mobility (R26.89);Muscle weakness (generalized) (M62.81);Other (comment)   Activity Tolerance Patient tolerated treatment well   Patient Left in chair;with call bell/phone within reach;with chair alarm set;with family/visitor present   Nurse Communication Mobility status        Time: 2952-8413 OT Time Calculation (min): 33 min  Charges: OT General Charges $OT Visit: 1 Visit OT Treatments $Self Care/Home Management : 23-37 mins  Lyliana Dicenso, BS, OTA/S    Rashmi Tallent 02/28/2024, 3:08 PM

## 2024-02-29 ENCOUNTER — Inpatient Hospital Stay (HOSPITAL_COMMUNITY)

## 2024-02-29 DIAGNOSIS — I340 Nonrheumatic mitral (valve) insufficiency: Secondary | ICD-10-CM | POA: Diagnosis not present

## 2024-02-29 LAB — CBC
HCT: 25.9 % — ABNORMAL LOW (ref 36.0–46.0)
Hemoglobin: 8.6 g/dL — ABNORMAL LOW (ref 12.0–15.0)
MCH: 29.5 pg (ref 26.0–34.0)
MCHC: 33.2 g/dL (ref 30.0–36.0)
MCV: 88.7 fL (ref 80.0–100.0)
Platelets: 214 10*3/uL (ref 150–400)
RBC: 2.92 MIL/uL — ABNORMAL LOW (ref 3.87–5.11)
RDW: 17.2 % — ABNORMAL HIGH (ref 11.5–15.5)
WBC: 9.4 10*3/uL (ref 4.0–10.5)
nRBC: 0 % (ref 0.0–0.2)

## 2024-02-29 LAB — BASIC METABOLIC PANEL WITH GFR
Anion gap: 9 (ref 5–15)
BUN: 68 mg/dL — ABNORMAL HIGH (ref 8–23)
CO2: 27 mmol/L (ref 22–32)
Calcium: 7.9 mg/dL — ABNORMAL LOW (ref 8.9–10.3)
Chloride: 88 mmol/L — ABNORMAL LOW (ref 98–111)
Creatinine, Ser: 1.57 mg/dL — ABNORMAL HIGH (ref 0.44–1.00)
GFR, Estimated: 33 mL/min — ABNORMAL LOW (ref >60)
Glucose, Bld: 97 mg/dL (ref 70–99)
Potassium: 4.2 mmol/L (ref 3.5–5.1)
Sodium: 124 mmol/L — ABNORMAL LOW (ref 135–145)

## 2024-02-29 LAB — MAGNESIUM: Magnesium: 2.2 mg/dL (ref 1.7–2.4)

## 2024-02-29 LAB — OSMOLALITY, URINE: Osmolality, Ur: 351 mosm/kg (ref 300–900)

## 2024-02-29 LAB — NA AND K (SODIUM & POTASSIUM), RAND UR
Potassium Urine: 20 mmol/L
Sodium, Ur: <10 mmol/L

## 2024-02-29 LAB — CREATININE, URINE, RANDOM: Creatinine, Urine: 30 mg/dL

## 2024-02-29 MED ORDER — POTASSIUM CHLORIDE CRYS ER 20 MEQ PO TBCR: 20.0000 meq | EXTENDED_RELEASE_TABLET | Freq: Two times a day (BID) | ORAL | Status: DC

## 2024-02-29 MED ORDER — TORSEMIDE 20 MG PO TABS: 40.0000 mg | ORAL_TABLET | Freq: Two times a day (BID) | ORAL | Status: DC

## 2024-02-29 MED ORDER — SODIUM CHLORIDE 1 G PO TABS
1.0000 g | ORAL_TABLET | Freq: Three times a day (TID) | ORAL | Status: DC
  Administered 2024-02-29 – 2024-03-02 (×6): 1 g via ORAL
  Filled 2024-02-29 (×6): qty 1

## 2024-02-29 NOTE — Progress Notes (Signed)
 Mobility Specialist Progress Note:   02/29/24 0924  Mobility  Activity Transferred from bed to chair  Level of Assistance Minimal assist, patient does 75% or more  Assistive Device None  Distance Ambulated (ft) 5 ft  RUE Weight Bearing Per Provider Order NWB  LUE Weight Bearing Per Provider Order NWB  Activity Response Tolerated well  Mobility Referral Yes  Mobility visit 1 Mobility  Mobility Specialist Start Time (ACUTE ONLY) [id number]  Mobility Specialist Stop Time (ACUTE ONLY) [id number]  Mobility Specialist Time Calculation (min) (ACUTE ONLY) 3 min   Pt received in bed, agreeable to mobility session. Requested to use [facility] prior to transferring to chair. Required MinA to power up and stand. Tolerated well, asx throughout. Void successful. Transferred pt to chair, left with all needs met, call bell in reach.    Karyl Sharrar Mobility Specialist Please contact via Special educational needs teacher or  Rehab office at [phone]

## 2024-02-29 NOTE — Progress Notes (Signed)
 CARDIAC REHAB PHASE I   Stopped by to offer walk in hallway, pt declined. Would like to walk later today. Assisted to [facility] and back to chair. Pt mobilizing well maintaining proper sternal. Encouraged mobility and IS use today. Will return to offer walk in hallway as time allows.   [id number]  Ronny Colas, RN BSN 02/29/2024 10:43 AM

## 2024-02-29 NOTE — Progress Notes (Addendum)
 [name] is an 83 y.o. female with a past medical history HTN and PAF who presents with shortness of breath with severe mitral regurgitation status post mitral valve replacement, MAZE, and LAA clipping.  Course complicated by systolic heart failure.  We were consulted for hyponatremia.   Assessment/Plan: Hypervolemic hyponatremia: Worsening and persistent.  No obvious symptoms.  Poor appetite for months now is certainly a reset osmostat could be playing a factor as well.  Likely multifactorial but certainly volume excess. - IV Lasix  80 mg 2 times Sunday and given shortness of breath, 2+ edema now with Unna boots, slow improvement in sodium s/p 1 dose on 4/28 -> now transitioned to torsemide . -Stopped the  SGLT2 with worsening renal function. 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 Change from Ure-Na to NaCl tablets. BP very soft but if Na worse tomorrow then will give trial with Tolvaptan. Would hold the diuretics for today. She is on fluid restriction today  Nonoliguric AKI: Creatinine has been elevated for most of the hospitalization but elevation has been fairly mild.  Likely some degree of ischemic injury during surgery.  Cholesterol embolic injury is always possible.  Also may just have lower GFR in the setting of her heart failure with new cardiorenal syndrome.  Torsemide  given yest; repeat urine studies. Will continue to monitor with diuresis; a little worse and will change from Ure-Na to NaCl tablets.     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  Followed by heart failure team previously.  Continue with diuresis as above.  Continue with goal-directed medical therapy which has already been initiated.  Monitor low blood pressures closely. Started SGLT2i which has efficacy in CHF but also in hyponatremia mgmt    Atrial fibrillation with RVR: Management per primary team   Hypokalemia: On spironolactone .  Supplement as needed  Subjective: Breathing much better after thoracentesis, intermittent cough but denies any nausea, headaches, severe pain.  Difficulty swallowing, dizziness with ambulation. Drank a lot of water yest.    Chemistry and CBC: Creatinine, Ser  Date/Time Value Ref Range Status  02/29/2024 04:47 AM 1.57 (H) 0.44 - 1.00 mg/dL Final  40/98/1191 47:82 PM 1.47 (H) 0.44 - 1.00 mg/dL Final  95/62/1308 65:78 AM 1.41 (H) 0.44 - 1.00 mg/dL Final  46/96/2952 84:13 PM 1.35 (H) 0.44 - 1.00 mg/dL Final  24/40/1027 25:36 AM 1.23 (H) 0.44 - 1.00 mg/dL Final  64/40/3474 25:95 PM 1.26 (H) 0.44 - 1.00 mg/dL Final  63/87/5643 32:95 PM 1.37 (H) 0.44 - 1.00 mg/dL Final  18/84/1660 63:01 AM 1.35 (H) 0.44 - 1.00 mg/dL Final  60/08/9322 55:73 AM 1.25 (H) 0.44 - 1.00 mg/dL Final  22/12/5425 06:23 AM 1.40 (H) 0.44 - 1.00 mg/dL Final  76/28/3151 76:16 AM 1.33 (H) 0.44 - 1.00 mg/dL Final  07/37/1062 69:48 AM 1.28 (H) 0.44 - 1.00 mg/dL Final  54/62/7035 00:93 PM 1.37 (H) 0.44 - 1.00 mg/dL Final  81/82/9937 16:96 AM 1.44 (H) 0.44 - 1.00 mg/dL Final  78/93/8101 75:10 PM 1.48 (H) 0.44 - 1.00 mg/dL Final  25/85/2778 24:23 AM 1.47 (H) 0.44 - 1.00 mg/dL Final  53/61/4431 54:00 PM 1.41 (H) 0.44 - 1.00 mg/dL Final  86/76/1950 93:26 AM 1.39 (H) 0.44 - 1.00 mg/dL Final  71/24/5809 98:33 PM 1.31 (H) 0.44 - 1.00 mg/dL Final  82/50/5397 67:34 AM 1.41 (H) 0.44 - 1.00  mg/dL Final  82/95/6213 08:65 PM 1.49 (H) 0.44 - 1.00 mg/dL Final  78/46/9629 52:84 AM 1.46 (H) 0.44 - 1.00 mg/dL Final  13/24/4010 27:25 PM 1.54 (H) 0.44 - 1.00 mg/dL Final  36/64/4034 74:25 AM 1.48 (H) 0.44 - 1.00 mg/dL Final  95/63/8756 43:32 AM 1.34 (H) 0.44 - 1.00 mg/dL Final  95/18/8416 60:63 PM 1.28 (H) 0.44 - 1.00 mg/dL Final  01/60/1093 23:55 AM 1.29 (H) 0.44 - 1.00 mg/dL Final  73/22/0254 27:06 AM 1.15 (H) 0.44 - 1.00 mg/dL Final   23/76/2831 51:76 AM 1.13 (H) 0.44 - 1.00 mg/dL Final  16/05/3709 62:69 AM 1.22 (H) 0.44 - 1.00 mg/dL Final  48/54/6270 35:00 AM 1.18 (H) 0.44 - 1.00 mg/dL Final  93/81/8299 37:16 AM 1.17 (H) 0.44 - 1.00 mg/dL Final  96/78/9381 01:75 PM 1.27 (H) 0.44 - 1.00 mg/dL Final  08/25/8526 78:24 AM 0.91 0.44 - 1.00 mg/dL Final  23/53/6144 31:54 PM 0.86 0.44 - 1.00 mg/dL Final  00/86/7619 50:93 AM 0.90 0.44 - 1.00 mg/dL Final  26/71/2458 09:98 AM 0.90 0.44 - 1.00 mg/dL Final  33/82/5053 97:67 AM 0.80 0.44 - 1.00 mg/dL Final  34/19/3790 24:09 AM 0.90 0.44 - 1.00 mg/dL Final  73/53/2992 42:68 AM 1.00 0.44 - 1.00 mg/dL Final  34/19/6222 97:98 AM 1.15 (H) 0.44 - 1.00 mg/dL Final  92/09/9416 40:81 AM 0.81 0.44 - 1.00 mg/dL Final  44/81/8563 14:97 PM 0.76 0.44 - 1.00 mg/dL Final  02/63/7858 85:02 PM 0.95 0.57 - 1.00 mg/dL Final  77/41/2878 67:67 AM 1.13 (H) 0.57 - 1.00 mg/dL Final  20/94/7096 28:36 AM 0.88 0.44 - 1.00 mg/dL Final  62/94/7654 65:03 AM 0.88 0.44 - 1.00 mg/dL Final  54/65/6812 75:17 PM 1.12 0.40 - 1.20 mg/dL Final  00/17/4944 96:75 AM 0.73 0.50 - 1.10 mg/dL Final  91/63/8466 59:93 AM 0.78 0.50 - 1.10 mg/dL Final  57/11/7791 90:30 PM 0.83 0.50 - 1.10 mg/dL Final  07/24/3006 62:26 AM 0.85 0.50 - 1.10 mg/dL Final   Recent Labs  Lab 02/26/24 1600 02/26/24 2200 02/27/24 0755 02/27/24 1711 02/28/24 0447 02/28/24 1500 02/29/24 0447  NA 120* 122* 123* 121* 124* 123* 124*  K 3.8 4.2 4.1 3.7 3.4* 3.8 4.2  CL 83* 86* 85* 84* 87* 86* 88*  CO2 24 25 26 26 27 27 27   GLUCOSE 134* 119* 118* 123* 102* 114* 97  BUN 27* 32* 52* 46* 60* 68* 68*  CREATININE 1.37* 1.26* 1.23* 1.35* 1.41* 1.47* 1.57*  CALCIUM  8.0* 8.1* 8.5* 8.2* 7.9* 8.4* 7.9*   Recent Labs  Lab 02/24/24 0425 02/29/24 0447  WBC 12.9* 9.4  HGB 8.3* 8.6*  HCT 25.4* 25.9*  MCV 88.8 88.7  PLT 284 214   Liver Function Tests: No results for input(s): "AST", "ALT", "ALKPHOS", "BILITOT", "PROT", "ALBUMIN " in the last 168  hours. No results for input(s): "LIPASE", "AMYLASE" in the last 168 hours. No results for input(s): "AMMONIA" in the last 168 hours. Cardiac Enzymes: No results for input(s): "CKTOTAL", "CKMB", "CKMBINDEX", "TROPONINI" in the last 168 hours. Iron  Studies: No results for input(s): "IRON ", "TIBC", "TRANSFERRIN", "FERRITIN" in the last 72 hours. PT/INR: @LABRCNTIP (inr:5)  Xrays/Other Studies: ) Results for orders placed or performed during the hospital encounter of 02/08/24 (from the past 48 hours)  Basic metabolic panel     Status: Abnormal   Collection Time: 02/27/24  5:11 PM  Result Value Ref Range   Sodium 121 (L) 135 - 145 mmol/L   Potassium 3.7 3.5 - 5.1 mmol/L   Chloride 84 (L)  98 - 111 mmol/L   CO2 26 22 - 32 mmol/L   Glucose, Bld 123 (H) 70 - 99 mg/dL    Comment: Glucose reference range applies only to samples taken after fasting for at least 8 hours.   BUN 46 (H) 8 - 23 mg/dL   Creatinine, Ser 7.42 (H) 0.44 - 1.00 mg/dL   Calcium  8.2 (L) 8.9 - 10.3 mg/dL   GFR, Estimated 40 (L) >60 mL/min    Comment: (NOTE) Calculated using the CKD-EPI Creatinine Equation (2021)    Anion gap 11 5 - 15    Comment: Performed at [facility], [address] [place], [place] [address]  Cooxemetry Panel (carboxy, met, total hgb, O2 sat)     Status: Abnormal   Collection Time: 02/28/24  4:47 AM  Result Value Ref Range   Total hemoglobin 8.4 (L) 12.0 - 16.0 g/dL   O2 Saturation 87.5 %   Carboxyhemoglobin 2.1 (H) 0.5 - 1.5 %   Methemoglobin 1.1 0.0 - 1.5 %    Comment: Performed at [facility], [address] [place], [place] [address]  Basic metabolic panel     Status: Abnormal   Collection Time: 02/28/24  4:47 AM  Result Value Ref Range   Sodium 124 (L) 135 - 145 mmol/L   Potassium 3.4 (L) 3.5 - 5.1 mmol/L   Chloride 87 (L) 98 - 111 mmol/L   CO2 27 22 - 32 mmol/L   Glucose, Bld 102 (H) 70 - 99 mg/dL    Comment: Glucose reference range applies only to samples taken after  fasting for at least 8 hours.   BUN 60 (H) 8 - 23 mg/dL   Creatinine, Ser 9.51 (H) 0.44 - 1.00 mg/dL   Calcium  7.9 (L) 8.9 - 10.3 mg/dL   GFR, Estimated 38 (L) >60 mL/min    Comment: (NOTE) Calculated using the CKD-EPI Creatinine Equation (2021)    Anion gap 10 5 - 15    Comment: Performed at [facility], [address], [place], [place] [address]  Magnesium      Status: None   Collection Time: 02/28/24  4:47 AM  Result Value Ref Range   Magnesium  2.3 1.7 - 2.4 mg/dL    Comment: Performed at [facility], [address], [place], [place] [address]  Basic metabolic panel     Status: Abnormal   Collection Time: 02/28/24  3:00 PM  Result Value Ref Range   Sodium 123 (L) 135 - 145 mmol/L   Potassium 3.8 3.5 - 5.1 mmol/L   Chloride 86 (L) 98 - 111 mmol/L   CO2 27 22 - 32 mmol/L   Glucose, Bld 114 (H) 70 - 99 mg/dL    Comment: Glucose reference range applies only to samples taken after fasting for at least 8 hours.   BUN 68 (H) 8 - 23 mg/dL   Creatinine, Ser 1.60 (H) 0.44 - 1.00 mg/dL   Calcium  8.4 (L) 8.9 - 10.3 mg/dL   GFR, Estimated 36 (L) >60 mL/min    Comment: (NOTE) Calculated using the CKD-EPI Creatinine Equation (2021)    Anion gap 10 5 - 15    Comment: Performed at [facility], [address] [place], [place] [address]  Basic metabolic panel     Status: Abnormal   Collection Time: 02/29/24  4:47 AM  Result Value Ref Range   Sodium 124 (L) 135 - 145 mmol/L   Potassium 4.2 3.5 - 5.1  mmol/L   Chloride 88 (L) 98 - 111 mmol/L   CO2 27 22 - 32 mmol/L   Glucose, Bld 97 70 - 99 mg/dL    Comment: Glucose reference range applies only to samples taken after fasting for at least 8 hours.   BUN 68 (H) 8 - 23 mg/dL   Creatinine, Ser 1.61 (H) 0.44 - 1.00 mg/dL   Calcium  7.9 (L) 8.9 - 10.3 mg/dL   GFR, Estimated 33 (L) >60 mL/min    Comment: (NOTE) Calculated using the CKD-EPI Creatinine Equation (2021)    Anion gap 9 5 - 15    Comment: Performed at [facility], [address] [place], [place] [address]  Magnesium      Status: None   Collection Time: 02/29/24  4:47 AM  Result Value Ref Range   Magnesium  2.2 1.7 - 2.4 mg/dL    Comment: Performed at [facility], [address] [place], [place] [address]  CBC     Status: Abnormal   Collection Time: 02/29/24  4:47 AM  Result Value Ref Range   WBC 9.4 4.0 - 10.5 K/uL   RBC 2.92 (L) 3.87 - 5.11 MIL/uL   Hemoglobin 8.6 (L) 12.0 - 15.0 g/dL   HCT 11.9 (L) 14.7 - 82.9 %   MCV 88.7 80.0 - 100.0 fL   MCH 29.5 26.0 - 34.0 pg   MCHC 33.2 30.0 - 36.0 g/dL   RDW 56.2 (H) 13.0 - 86.5 %   Platelets 214 150 - 400 K/uL   nRBC 0.0 0.0 - 0.2 %    Comment: Performed at [facility] [place] [facility], [address] [place], [place] [address]   DG Chest 2 View Result Date: 02/29/2024 CLINICAL DATA:  [id number] Pleural effusion [id number] EXAM: CHEST - 2 VIEW COMPARISON:  None available. FINDINGS: Right PICC terminates at the cavoatrial junction. Patchy airspace opacities in both lung bases. Small bilateral pleural effusions. No pneumothorax. Mitral valve annuloplasty with left atrial appendage clip. No acute fracture or destructive lesion. IMPRESSION: 1. Patchy airspace opacities in both lung bases, likely atelectasis, with small bilateral pleural effusions, unchanged. 2. Right PICC terminates at the cavoatrial junction. Electronically Signed   By: Rance Burrows M.D.   On: 02/29/2024 09:42    PMH:   Past Medical History:  Diagnosis Date   Acid reflux    Anemia    Arthritis    Atrial fibrillation (HCC)    Cataract    Diverticulosis of colon    Endometrial cancer (HCC)    1A grade endometrial ca   Essential hypertension 01/24/2009   Qualifier: Diagnosis of  By: Nelson-Smith CMA (AAMA), Dottie     Hx of radiation therapy 04/24/08-05/29/08& 8/12/,8/19,&06/27/08   external beam  through 7/09 ,then intracavity in 06/2008   Hypertension    Mitral valve prolapse    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    Tricuspid regurgitation     PSH:   Past Surgical History:  Procedure Laterality Date   APPENDECTOMY     1967   arthroscopic knee right surgery     CLIPPING OF ATRIAL APPENDAGE N/A 02/08/2024   Procedure: CLIPPING, LEFT ATRIAL APPENDAGE USING A MEDTRONIC CLIP;  Surgeon: Melene Sportsman, MD;  Location: [facility] OR;  Service: Open Heart Surgery;  Laterality: N/A;   ECTOPIC PREGNANCY SURGERY  1967   IR THORACENTESIS ASP PLEURAL SPACE W/IMG GUIDE  02/27/2024   iron  infusion  12/2022   LAPAROSCOPIC HYSTERECTOMY     total  MAZE N/A 02/08/2024   Procedure: MAZE PROCEDURE;  Surgeon: Melene Sportsman, MD;  Location: [facility] OR;  Service: Open Heart Surgery;  Laterality: N/A;   MITRAL VALVE REPAIR N/A 02/08/2024   Procedure: MITRAL VALVE REPAIR USING A SIMULUS SEMI-RIGID ANNULOPLASTY BAND;  Surgeon: Melene Sportsman, MD;  Location: [facility] OR;  Service: Open Heart Surgery;  Laterality: N/A;  possible replacement   ORTHOPEDIC SURGERY     Orthoscopic Knee Surgery   RIGHT/LEFT HEART CATH AND CORONARY ANGIOGRAPHY N/A 12/30/2023   Procedure: RIGHT/LEFT HEART CATH AND CORONARY ANGIOGRAPHY;  Surgeon: [name], MD;  Location: [facility] INVASIVE CV LAB;  Service: Cardiovascular;  Laterality: N/A;   TEAR DUCT PROBING     TEE WITHOUT CARDIOVERSION N/A 02/08/2024   Procedure: ECHOCARDIOGRAM, TRANSESOPHAGEAL;  Surgeon: Melene Sportsman, MD;  Location: [facility] OR;  Service: Open Heart Surgery;  Laterality: N/A;   TOOTH EXTRACTION     bone graft   TRANSESOPHAGEAL ECHOCARDIOGRAM (CATH LAB) N/A 12/30/2023   Procedure: TRANSESOPHAGEAL ECHOCARDIOGRAM;  Surgeon: Euell Herrlich, MD;  Location: [facility] INVASIVE CV LAB;  Service: Cardiovascular;  Laterality: N/A;    Allergies:  Allergies  Allergen Reactions   Crestor [Rosuvastatin] Other (See Comments)    Leg cramps   Glycopyrrolate      Other reaction(s): extreme dry mouth   Codeine Other (See Comments)    Hyper    Medications:   Prior to Admission medications   Medication Sig Start Date End  Date Taking? Authorizing Provider  amiodarone  (PACERONE ) 200 MG tablet Take 1 tablet (200 mg total) by mouth daily. 01/13/24  Yes Meng, Hao, PA  apixaban  (ELIQUIS ) 5 MG TABS tablet Take 1 tablet (5 mg total) by mouth 2 (two) times daily. 01/26/24  Yes [name], MD  b complex vitamins tablet Take 1 tablet by mouth daily.     Yes [provider]  Bempedoic Acid -Ezetimibe  (NEXLIZET) 180-10 MG TABS Take 1 tablet by mouth daily.   Yes [provider]  CALCIUM  PO Take 650 mg by mouth daily.   Yes [provider]  Cholecalciferol (VITAMIN D3) 125 MCG (5000 UT) CAPS Take 5,000 Units by mouth daily.   Yes [provider]  denosumab  (PROLIA ) 60 MG/ML SOSY injection Inject 60 mg into the skin every 6 (six) months.   Yes [provider]  furosemide  (LASIX ) 20 MG tablet Take 1 tablet (20 mg total) by mouth daily. 01/12/24 01/11/25 Yes Melene Sportsman, MD  Garlic 1000 MG CAPS Take 1,000 mg by mouth daily.   Yes [provider]  Joneen Nelson Omega-3 300 MG CAPS Take 300 mg by mouth daily.   Yes [provider]  loperamide  (IMODIUM ) 2 MG capsule Take 1 capsule (2 mg total) by mouth 4 (four) times daily as needed for diarrhea or loose stools. 01/10/21  Yes Trish Furl, MD  metoprolol  succinate (TOPROL -XL) 25 MG 24 hr tablet Take 25 mg by mouth 2 (two) times daily.  Take additional tablet for palpitations   Yes [provider]  Multiple Vitamins-Minerals (MULTIVITAMINS THER. W/MINERALS) TABS Take 1 tablet by mouth daily.     Yes [provider]  pantoprazole  (PROTONIX ) 40 MG tablet Take 40 mg by mouth daily.   Yes [provider]  potassium chloride  SA (KLOR-CON  M) 20 MEQ tablet Take 0.5 tablets (10 mEq total) by mouth daily. 01/12/24  Yes Melene Sportsman, MD  Zinc 50 MG TABS Take 50 mg by mouth daily.   Yes [provider]  apixaban  (ELIQUIS ) 5 MG  TABS tablet Take 1 tablet (5 mg total) by mouth 2 (two) times daily. 01/31/24   [name], MD  atorvastatin  (LIPITOR) 10 MG tablet Take 1 tablet (10 mg total) by mouth daily. Patient not taking: Reported on 01/25/2024 12/20/23 03/19/24  [name], MD  ezetimibe  (ZETIA ) 10 MG tablet Take 1 tablet (10 mg total) by mouth daily. Patient not taking: Reported on 01/25/2024 12/20/23 03/19/24  [name], MD    Discontinued Meds:   Medications Discontinued During This Encounter  Medication Reason   EPINEPHrine  (ADRENALIN ) 5 mg in NS 250 mL (0.02 mg/mL) premix infusion    milrinone  (PRIMACOR ) 20 MG/100 ML (0.2 mg/mL) infusion    nitroGLYCERIN  50 mg in dextrose  5 % 250 mL (0.2 mg/mL) infusion    norepinephrine  (LEVOPHED ) 4mg  in (0.016 mg/mL) premix infusion    potassium chloride  injection 80 mEq    heparin  30,000 units/NS 1000 mL solution for CELLSAVER    heparin  sodium (porcine) 2,500 Units, papaverine 30 mg in electrolyte-A (PLASMALYTE-A PH 7.4) 500 mL irrigation    tranexamic acid  (CYKLOKAPRON ) pump prime solution 137 mg    vancomycin  (VANCOCIN ) 1,000 mg in sodium chloride  0.9 % 1,000 mL irrigation    [facility] Blood Cardioplegia vial (lidocaine /magnesium /mannitol  0.26g-4g-6.4g)    [facility] Blood Cardioplegia vial (lidocaine /magnesium /mannitol  0.26g-4g-6.4g)    [facility]    metoprolol  tartrate (LOPRESSOR ) tablet 12.5 mg    chlorhexidine  (HIBICLENS ) 4 % liquid 2 Application    0.9 % irrigation (POUR BTL)    vancomycin  (VANCOCIN ) 1,000 mg in sodium chloride  0.9 % 1,000 mL irrigation    Surgifoam 1 Gm with 0.9% sodium chloride  (4 ml) topical solution Patient Discharge   Bempedoic Acid -Ezetimibe  180-10 MG TABS 1 tablet Not available   sodium chloride  flush (NS) 0.9 % injection 3-10 mL    sodium chloride  flush (NS) 0.9 % injection 3-10 mL    sodium chloride  flush (NS) 0.9 % injection 3-10 mL    sodium chloride  flush (NS) 0.9 % injection 3-10 mL    sodium chloride  flush (NS) 0.9 % injection 3-10 mL    sodium chloride  flush (NS) 0.9  % injection 3-10 mL    acetaminophen  (TYLENOL ) 160 MG/5ML solution 1,000 mg Change in therapy   aspirin  chewable tablet 324 mg Change in therapy   metoprolol  tartrate (LOPRESSOR ) 25 mg/10 mL oral suspension 12.5 mg Change in therapy   0.45 % sodium chloride  infusion Change in therapy   acetaminophen  (TYLENOL ) 160 MG/5ML solution 650 mg    Oral care mouth rinse    Oral care mouth rinse    midazolam  (VERSED ) injection 2 mg    dexmedetomidine  (PRECEDEX ) 400 MCG/100ML (4 mcg/mL) infusion    nitroGLYCERIN  50 mg in dextrose  5 % 250 mL (0.2 mg/mL) infusion    amiodarone  (PACERONE ) tablet 400 mg    EPINEPHrine  (ADRENALIN ) 5 mg in NS 250 mL (0.02 mg/mL) premix infusion    potassium chloride  SA (KLOR-CON  M) CR tablet 20 mEq    amiodarone  (NEXTERONE ) 1.5 mg/mL IV bolus only 150 mg Entry Error   insulin  regular, human (MYXREDLIN ) 100 units/ 100 mL infusion    enoxaparin  (LOVENOX ) injection 30 mg    phenylephrine  (NEO-SYNEPHRINE) 20mg /NS 250mL premix infusion    metoprolol  tartrate (LOPRESSOR ) tablet 12.5 mg    aspirin  EC tablet 325 mg    vasopressin  (PITRESSIN) 20 Units in 100 mL (0.2 unit/mL) infusion-*FOR SHOCK*    amiodarone  (NEXTERONE  PREMIX) 360-4.14 MG/200ML-% (1.8 mg/mL) IV infusion  norepinephrine  (LEVOPHED ) 16 mg in (0.064 mg/mL) premix infusion    heparin  ADULT infusion 100 units/mL (25000 units/250mL)    amiodarone  (NEXTERONE ) 1.5 mg/mL IV bolus only 150 mg    apixaban  (ELIQUIS ) tablet 2.5 mg    amiodarone  (NEXTERONE ) 1.8 mg/mL load via infusion 150 mg    scopolamine  (TRANSDERM-SCOP) 1 MG/3DAYS 1.5 mg    amiodarone  (PACERONE ) tablet 400 mg Duplicate   furosemide  (LASIX ) injection 40 mg    amiodarone  (PACERONE ) tablet 400 mg    amiodarone  (NEXTERONE ) 1.5 mg/mL IV bolus only 150 mg    potassium chloride  10 mEq in 50 mL *CENTRAL LINE* IVPB    docusate sodium  (COLACE) capsule 200 mg    bisacodyl  (DULCOLAX) EC tablet 10 mg    bisacodyl  (DULCOLAX) suppository 10 mg    amiodarone   (NEXTERONE ) 1.5 mg/mL IV bolus only 150 mg    senna (SENOKOT) tablet 8.6 mg    potassium chloride  SA (KLOR-CON  M) CR tablet 40 mEq    ondansetron  (ZOFRAN ) injection 4 mg    digoxin  (LANOXIN ) 0.1 MG/ML injection 0.5 mg Change in therapy   digoxin  (LANOXIN ) 0.1 MG/ML injection 0.25 mg Change in therapy   bethanechol  (URECHOLINE ) tablet 5 mg Discontinued by provider   potassium chloride  (KLOR-CON ) packet 40 mEq    potassium chloride  10 mEq in 50 mL *CENTRAL LINE* IVPB    loperamide  HCl (IMODIUM ) 1 MG/7.5ML suspension 2 mg    potassium chloride  SA (KLOR-CON  M) CR tablet 40 mEq    potassium chloride  (KLOR-CON ) packet 40 mEq    potassium chloride  (KLOR-CON ) packet 40 mEq Duplicate   aspirin  chewable tablet 81 mg    traMADol  (ULTRAM ) tablet 50 mg    sodium chloride  flush (NS) 0.9 % injection 3 mL    sodium chloride  flush (NS) 0.9 % injection 10-40 mL    fentaNYL  (SUBLIMAZE ) injection 100 mcg    sodium chloride  flush (NS) 0.9 % injection 3-10 mL    sodium chloride  flush (NS) 0.9 % injection 10-40 mL    potassium chloride  SA (KLOR-CON  M) CR tablet 40 mEq    potassium chloride  (KLOR-CON ) packet 20 mEq    potassium chloride  10 mEq in 50 mL *CENTRAL LINE* IVPB    metoprolol  tartrate (LOPRESSOR ) injection 2.5-5 mg    digoxin  (LANOXIN ) tablet 0.125 mg    digoxin  (LANOXIN ) tablet 0.0625 mg    furosemide  (LASIX ) 200 mg in dextrose  5 % 100 mL (2 mg/mL) infusion    potassium chloride  SA (KLOR-CON  M) CR tablet 40 mEq    potassium chloride  SA (KLOR-CON  M) CR tablet 40 mEq    aspirin  EC tablet 81 mg    milrinone  (PRIMACOR ) 20 MG/100 ML (0.2 mg/mL) infusion    traMADol  (ULTRAM ) tablet 50 mg    amiodarone  (NEXTERONE  PREMIX) 360-4.14 MG/200ML-% (1.8 mg/mL) IV infusion    morphine  (PF) 2 MG/ML injection 1-4 mg    potassium chloride  SA (KLOR-CON  M) CR tablet 20 mEq    potassium chloride  (KLOR-CON  M) CR tablet 30 mEq    spironolactone  (ALDACTONE ) tablet 12.5 mg    potassium chloride  (KLOR-CON  M) CR tablet  30 mEq    amiodarone  (PACERONE ) tablet 400 mg    amiodarone  (PACERONE ) tablet 200 mg    dextrose  50 % solution 0-50 mL    insulin  aspart (novoLOG ) injection 2-6 Units    feeding supplement (BOOST / RESOURCE BREEZE) liquid 1 Container    sodium chloride  flush (NS) 0.9 % injection 3 mL    sodium chloride   flush (NS) 0.9 % injection 3 mL    0.9 %  sodium chloride  infusion    amiodarone  (PACERONE ) tablet 200 mg    digoxin  (LANOXIN ) tablet 0.0625 mg    torsemide  (DEMADEX ) tablet 40 mg    potassium chloride  SA (KLOR-CON  M) CR tablet 40 mEq    torsemide  (DEMADEX ) tablet 40 mg    urea (URE-NA) oral packet 15 g    furosemide  (LASIX ) injection 80 mg    potassium chloride  SA (KLOR-CON  M) CR tablet 20 mEq    potassium chloride  SA (KLOR-CON  M) CR tablet 20 mEq    urea (URE-NA) oral packet 30 g    losartan  (COZAAR ) tablet 12.5 mg    potassium chloride  SA (KLOR-CON  M) CR tablet 20 mEq     Social History:  reports that she has never smoked. She has never used smokeless tobacco. She reports current alcohol use. She reports that she does not use drugs.  Family History:   Family History  Problem Relation Age of Onset   Diabetes type II Mother    Breast cancer Mother    Leukemia Father    Diabetes type II Sister    Breast cancer Sister    Cancer Brother 30       throat 10/2020   Pancreatic cancer Maternal Aunt        x2   Diabetes Maternal Grandmother    Diabetes Maternal Grandfather    Colon cancer Neg Hx    Stomach cancer Neg Hx    Esophageal cancer Neg Hx     Blood pressure (!) 97/51, pulse 79, temperature (!) 97.4 F (36.3 C), temperature source Oral, resp. rate (!) 25, height 5\' 4"  (1.626 m), weight 71.4 kg, SpO2 98%. Physical Exam: General: NAD HEENT: anicteric sclera CV: Normal rate, no rub, 2+ pitting edema in the bilateral lower extremities, UNNA boots Lungs: Mild increased work of breathing, trace rales Abd: SNDNT +BS Skin: no visible lesions or rashes Musculoskeletal: no  obvious deformities Neuro: normal speech, no gross focal deficits      Kiandra Sanguinetti, Alveda Aures, MD 02/29/2024, 10:08 AM

## 2024-02-29 NOTE — Progress Notes (Addendum)
      [address]       [place] [address]             [phone]        21 Days Post-Op Procedure(s) (LRB): MITRAL VALVE REPAIR USING A SIMULUS SEMI-RIGID ANNULOPLASTY BAND (N/A) MAZE PROCEDURE (N/A) CLIPPING, LEFT ATRIAL APPENDAGE USING A MEDTRONIC CLIP (N/A) ECHOCARDIOGRAM, TRANSESOPHAGEAL (N/A)  Subjective: Patient sitting in chair this am. She states breathing is ok this am  Objective: Vital signs in last 24 hours: Temp:  [97.5 F (36.4 C)-98.1 F (36.7 C)] 98.1 F (36.7 C) (04/30 0325) Pulse Rate:  [73-78] 78 (04/30 0333) Cardiac Rhythm: Normal sinus rhythm (04/29 1900) Resp:  [10-20] 18 (04/30 0333) BP: (80-102)/(46-61) 87/47 (04/30 0333) SpO2:  [96 %-98 %] 98 % (04/30 0333) Weight:  [71.4 kg] 71.4 kg (04/30 0240)  Pre op weight 68.5 kg Current Weight  02/29/24 71.4 kg    Hemodynamic parameters for last 24 hours: CVP:  [8 mmHg-14 mmHg] 11 mmHg  Intake/Output from previous day: 04/29 0701 - 04/30 0700 In: 740 [P.O.:740] Out: 2275 [Urine:2275]   Physical Exam:  Cardiovascular: RRR Pulmonary: Slightly diminished bibasilar breath sounds Abdomen: Soft, non tender, bowel sounds present. Extremities: Unna boots Wound: Clean and dry.  No erythema or signs of infection.  Lab Results: CBC: Recent Labs    02/29/24 0447  WBC 9.4  HGB 8.6*  HCT 25.9*  PLT 214   BMET:  Recent Labs    02/28/24 1500 02/29/24 0447  NA 123* 124*  K 3.8 4.2  CL 86* 88*  CO2 27 27  GLUCOSE 114* 97  BUN 68* 68*  CREATININE 1.47* 1.57*  CALCIUM  8.4* 7.9*    PT/INR:  Lab Results  Component Value Date   INR 1.6 (H) 02/08/2024   INR 1.0 02/07/2024   ABG:  INR: Will add last result for INR, ABG once components are confirmed Will add last 4 CBG results once components are confirmed  Assessment/Plan: CV-Has had persistent fib with RVR. S/p DCCV 04/18 (failed).  SR with HR in the 70's this am.  On Amiodarone  400 mg bid and Apixaban  5 mg  bid.  Pulmonary-On room air. S/p left thoracentesis 1.2 L removed 04/28. Encourage incentive spirometer. Acute systolic heart failure, non ischemic cardiomyopathy-on Spironolactone  25 mg daily, Farxiga 10 mg daily, and Unna boots.  AHF following Expected post op blood loss anemia-Last H and H 8.6 and 25.9 5. Hypervolemic hyponatremia, component of SIADH-sodium this am 124 (has been 123-124 mostly of late).  6. Non oliguric AKI-Creatinine this am increased to 1.57. She received Torsemide  40 mg bid and Diamox  500 mg yesterday . Creatinine prior to admission 1.15. 7. Deconditioned-continue PT. When ready for discharge, and per CIR note yesterday, home health recommended not CIR 8. Disposition-hopefully, home soon when diuretic regimen determined  Donielle M ZimmermanPA-C 6:59 AM  Agree with above Would hold diuretics today for a holiday and observe Cr tomorrow

## 2024-02-29 NOTE — Progress Notes (Signed)
 Nutrition Follow-up  DOCUMENTATION CODES:  Not applicable  INTERVENTION:  Liberalize diet further to regular as pt not likely meeting or exceeding any nutrient limits and hyponatremia secondary to suspected SIADH Encourage adequate PO intake Chopped meats with meals; Extra sauces and gravies on all trays [employer] Essentials TID, each packet mixed with 8 ounces of 2% milk provides 13 grams of protein and 260 calories. Magic cup TID with meals, each supplement provides 290 kcal and 9 grams of protein Ensure Enlive po BID when desired, each supplement provides 350 kcal and 20 grams of protein. MVI with minerals daily  NUTRITION DIAGNOSIS:  Increased nutrient needs related to acute illness as evidenced by estimated needs. - remains applicable  GOAL:  Patient will meet greater than or equal to 90% of their needs - goal unmet, addressing via meals and nutrition supplements  MONITOR:  PO intake, Supplement acceptance, I & O's, Labs  REASON FOR ASSESSMENT:  Consult Assessment of nutrition requirement/status, Poor PO  ASSESSMENT:  83 y.o. presented for mitral valve repair surgery and remained intubated post-op. PMH includes A. Fib, anemia, and HTN.  4/09 - Op, s/p Mitral Valve repair; Admitted; extubated 4/18 - s/p DCCV 4/21 - SLP evaluation, recommend regular/thin liquids 4/23 - s.p FEES; transferred to floor  4/28 - s/p thoracentesis - yield 1.2L  Pt continues with hyponatremia secondary to suspected SIADH. Diuretic held today. Nephrology changing Ure-Na to NaCl tablets.   Pt sitting up in bedside recliner at time of visit. She endorses ongoing very poor PO intake. She reports difficulty chewing/swallowing post surgery which SLP had been following and performed FEES. Pt reports primarily consuming 25-33% of mashed potatoes with gravy on meal trays and a couple bites of meats though these are what she is having most difficulty with. Discussed modifying meats to a more  chopped/easy to chew consistency and adding extra sauces and gravies for moisture. We also discussed alternative nutrition supplements as she is trying to consume Ensure but states that these give her diarrhea.   Meal completions: 4/21: 100% breakfast, 0% lunch, 25% dinner 4/24: 75% breakfast 4/26: 25% lunch 4/27: 25% lunch 4/29: 25% dinner  Admit weight: 68.5 kg Current weight: 71.4 kg  UOP: x24 hours I/O's: -11.7L since 4/16  Medications: farxiga, magonate, melatonin, MVI, protonix , klor-con  BID, urea  Labs: Sodium 124 BUN 68 Cr 1.57 Calcium  7.9 GFR 33  Diet Order:   Diet Order             Diet regular Room service appropriate? Yes; Fluid consistency: Thin; Fluid restriction: 1500 mL Fluid  Diet effective now                   EDUCATION NEEDS:  No education needs have been identified at this time  Skin:  Skin Assessment: Reviewed RN Assessment  Last BM:  4/29 type 7 large  Height:  Ht Readings from Last 1 Encounters:  02/08/24 5\' 4"  (1.626 m)    Weight:  Wt Readings from Last 1 Encounters:  02/29/24 71.4 kg    Ideal Body Weight:  54.6 kg  BMI:  Body mass index is 27.02 kg/m.  Estimated Nutritional Needs:   Kcal:  1800-2000  Protein:  90-110 grams  Fluid:  >/= 1.8 L  Rocklin Chute, RDN, LDN Clinical Nutrition See AMiON for contact information.

## 2024-02-29 NOTE — Progress Notes (Signed)
 Inpatient Rehabilitation Admissions Coordinator   Rehab consult received. We originally signed off 4/28 for therapy is recommending HH. Yesterday with PT she was contact guard assist 180 feet with RW. Needs a lot of encouragement to work with Cardiac rehab. She is not a candidate for CIR. If she feels not able to d/c home, would consider SNF. [employer] Medicare will not approve CIR at this level.  Jeannetta Millman, RN, MSN Rehab Admissions Coordinator [phone] 02/29/2024 12:46 PM

## 2024-02-29 NOTE — Plan of Care (Signed)
  Problem: Education: Goal: Knowledge of General Education information will improve Description: Including pain rating scale, medication(s)/side effects and non-pharmacologic comfort measures Outcome: Progressing   Problem: Health Behavior/Discharge Planning: Goal: Ability to manage health-related needs will improve Outcome: Progressing   Problem: Clinical Measurements: Goal: Ability to maintain clinical measurements within normal limits will improve Outcome: Progressing Goal: Will remain free from infection Outcome: Progressing Goal: Diagnostic test results will improve Outcome: Progressing Goal: Respiratory complications will improve Outcome: Progressing Goal: Cardiovascular complication will be avoided Outcome: Progressing   Problem: Activity: Goal: Risk for activity intolerance will decrease Outcome: Progressing   Problem: Nutrition: Goal: Adequate nutrition will be maintained Outcome: Progressing   Problem: Coping: Goal: Level of anxiety will decrease Outcome: Progressing   Problem: Elimination: Goal: Will not experience complications related to bowel motility Outcome: Progressing Goal: Will not experience complications related to urinary retention Outcome: Progressing   Problem: Pain Managment: Goal: General experience of comfort will improve and/or be controlled Outcome: Progressing   Problem: Safety: Goal: Ability to remain free from injury will improve Outcome: Progressing   Problem: Skin Integrity: Goal: Risk for impaired skin integrity will decrease Outcome: Progressing   Problem: Education: Goal: Will demonstrate proper wound care and an understanding of methods to prevent future damage Outcome: Progressing Goal: Knowledge of disease or condition will improve Outcome: Progressing Goal: Knowledge of the prescribed therapeutic regimen will improve Outcome: Progressing   Problem: Activity: Goal: Risk for activity intolerance will decrease Outcome:  Progressing   Problem: Cardiac: Goal: Will achieve and/or maintain hemodynamic stability Outcome: Progressing   Problem: Clinical Measurements: Goal: Postoperative complications will be avoided or minimized Outcome: Progressing   Problem: Respiratory: Goal: Respiratory status will improve Outcome: Progressing   Problem: Skin Integrity: Goal: Wound healing without signs and symptoms of infection Outcome: Progressing Goal: Risk for impaired skin integrity will decrease Outcome: Progressing

## 2024-02-29 NOTE — Progress Notes (Signed)
 Advanced Heart Failure Rounding Note  Cardiologist: [name], MD   Chief Complaint: Heart Failure Subjective:    - S/p MVR 02/08/24. Post-op complicated by persistent a fib RVR and volume overload. - Failed DCCV on 4/18 - Back in SR 4/21. Diuretics stopped d/t hypochloremia, hypokalemia and hyponatremia. Given hypertonic saline. - Milrinone  stopped 4/22 - Left thoracentesis 4/28  Yesterday switched to oral diuretics.  Feeling better. Denies SOB.    Objective:    Weight Range: 71.4 kg Body mass index is 27.02 kg/m.   Vital Signs:   Temp:  [97.4 F (36.3 C)-98.1 F (36.7 C)] 97.4 F (36.3 C) (04/30 0848) Pulse Rate:  [73-79] 79 (04/30 0848) Resp:  [10-25] 25 (04/30 0900) BP: (80-102)/(46-61) 97/51 (04/30 0848) SpO2:  [96 %-98 %] 98 % (04/30 0848) Weight:  [71.4 kg] 71.4 kg (04/30 0240) Last BM Date : 02/28/24  Weight change: Filed Weights   02/27/24 0500 02/28/24 0307 02/29/24 0240  Weight: 80 kg 72.1 kg 71.4 kg   Intake/Output:  Intake/Output Summary (Last 24 hours) at 02/29/2024 0915 Last data filed at 02/29/2024 0800 Gross per 24 hour  Intake 740 ml  Output 2500 ml  Net -1760 ml   CVP 8-9  Physical Exam  General:   No resp difficulty Neck: supple. no JVD.  Cor: PMI nondisplaced. Regular rate & rhythm. No rubs, gallops or murmurs. Lungs: Decreased RLL.  LLL crackles. On room air.  Abdomen: soft, nontender, nondistended.  Extremities: no cyanosis, clubbing, rash, R and LLE unna boots.  RUE PICC Neuro: alert & oriented x3   Telemetry  SR 70 s  Labs    CBC Recent Labs    02/29/24 0447  WBC 9.4  HGB 8.6*  HCT 25.9*  MCV 88.7  PLT 214    Basic Metabolic Panel Recent Labs    30/86/57 0447 02/28/24 1500 02/29/24 0447  NA 124* 123* 124*  K 3.4* 3.8 4.2  CL 87* 86* 88*  CO2 27 27 27   GLUCOSE 102* 114* 97  BUN 60* 68* 68*  CREATININE 1.41* 1.47* 1.57*  CALCIUM  7.9* 8.4* 7.9*  MG 2.3  --  2.2   Imaging   No results  found.    Medications:    Scheduled Medications:  amiodarone   400 mg Oral BID   apixaban   5 mg Oral BID   atorvastatin   10 mg Oral Daily   Chlorhexidine  Gluconate Cloth  6 each Topical Daily   [facility], Patient & Family Education   Does not apply Once   dapagliflozin propanediol  10 mg Oral Daily   ezetimibe   10 mg Oral Daily   feeding supplement  237 mL Oral BID BM   magnesium  gluconate  500 mg Oral Daily   melatonin  3 mg Oral QHS   multivitamin with minerals  1 tablet Oral Daily   pantoprazole   40 mg Oral Daily   potassium chloride   30 mEq Oral BID   sodium chloride  flush  3-10 mL Intravenous Q12H   spironolactone   25 mg Oral Daily   urea  15 g Oral BID   Infusions:  promethazine  (PHENERGAN ) injection (IM or IVPB) Stopped (02/20/24 0000)   PRN Medications: loperamide , ondansetron  (ZOFRAN ) IV, mouth rinse, oxyCODONE , phenol, polyethylene glycol, promethazine  (PHENERGAN ) injection (IM or IVPB), sodium chloride  flush, traMADol , witch hazel-glycerin   Patient Profile   [name] is a 83 y.o. female with HTN and PAF. Admitted with SOB> found to have severe MR. Now  s/p MVR, MAZE and LAA clipping. AHF team consulted for acute systolic heart failure.   Assessment/Plan  Acute systolic heart failure, NiCM - Intra-op TEE 02/08/24 with EF 55-60% and severe MR - [facility] 2/25 with no CAD. RA 8, PA 41/17 (14), Fick CO/CI 7.76/4.41, PAPi 3, LVEDP 11 - Echo 4/25 with EF 35-40%, LV with GHK, RV normal, LA mod dilated, RA severely dilated, trivial MR, mod TR - Suspect drop in EF 2/2 persistent AF RVR.  - NYHA II-IIIa, IV on admission -  Volume status improved. CVP 8-9. Continue Torsemide  40 BID  -GDMT limited by soft SBP.   - Continue Farxiga 10 mg daily - Now off digoxin  - Continue spiro 25 mg daily - Continue UNNA boots   Atrial fibrillation with RVR - S/p Maze and LAA clipping 4/25 - Failed DCCV X 2 04/17 - In and out of Afib this admit, SR today.  - Continue  amiodarone  400 mg BID - Continue eliquis  5 mg BID.   Severe MR s/p MVR - Intra-op TEE with severe MR 02/08/24 - Trivial MR on echo 02/14/24  4. AKI - baseline Cr normal - Cr up slightly, 1.2>1.35>1.41>1.57  BUN trending up but also on Ure-Na  7. Hypokalemia - Stable.   8. Hypervolemic hyponatremia - Nephrology consulted - Felt to be multifactorial, volume overload, component of SIADH -  Nephrology added Ure-Na. Holding off on hypertonic saline as low as Na continues to improve  9. Left pleural effusion - S/p left thoracentesis on 04/28  10. Debility - Continue aggressive PT/OT    Length of Stay: 21  Bryker Fletchall, NP  02/29/2024, 9:15 AM

## 2024-02-29 NOTE — TOC Progression Note (Signed)
 Transition of Care (TOC) - Progression Note  Sherin Dingwall RN, BSN Transitions of Care Unit 4E- RN Case Manager See Treatment Team for direct phone #   Patient Details  Name: [name] MRN: [id number] Date of Birth: May 10, 1941  Transition of [facility]) CM/SW Contact  Jearld Min, Myrtle Atta, RN Phone Number: 02/29/2024, 4:25 PM  Clinical Narrative:    Noted CIR screening note per B. Boyette. Pt is not a candidate for CIR at current level of function and recommendations.   CM spoke with pt and spouse- they both state that they feel pt needs more assistance at this time than spouse can provide- explained that pt is not a candidate for [facility] INPT rehab, and that under current recommendations insurance would not approve SNF stay. Will need to have PT/OT re-eval for possible SNF. Pt and spouse agreeable to SNF stay- (first choice would be [facility], do not want [facility], open to any other bed offers).   If pt has to return home, confirmed [facility] would be with [facility]- following w/ referral already. Pt has shower chair and rollator, but may need BSC and regular RW if has to return home.   CM will follow up in the am to have PT/OT re-eval pt for potential SNF and update CSW to follow up for SNF workup if needed.    Expected Discharge Plan: Home w Home Health Services Barriers to Discharge: No Barriers Identified  Expected Discharge Plan and Services   Discharge Planning Services: CM Consult Post Acute Care Choice: Home Health Living arrangements for the past 2 months: Single Family Home                           HH Arranged: RN, PT, OT [facility] Agency: [facility] Date [facility] Agency Contacted: 02/27/24 Time HH Agency Contacted: [phone] Representative spoke with at [facility] Agency: [name]   Social Determinants of Health (SDOH) Interventions SDOH Screenings   Food Insecurity: No Food Insecurity (02/08/2024)  Housing: Low Risk  (02/08/2024)  Transportation Needs: No Transportation  Needs (02/08/2024)  Utilities: Not At Risk (02/08/2024)  Depression (PHQ2-9): Low Risk  (12/20/2023)  Social Connections: Socially Integrated (02/09/2024)  Tobacco Use: Low Risk  (02/08/2024)    Readmission Risk Interventions     No data to display

## 2024-03-01 ENCOUNTER — Encounter: Payer: Self-pay | Admitting: Cardiology

## 2024-03-01 LAB — BASIC METABOLIC PANEL WITH GFR
Anion gap: 7 (ref 5–15)
Anion gap: 9 (ref 5–15)
BUN: 59 mg/dL — ABNORMAL HIGH (ref 8–23)
BUN: 55 mg/dL — ABNORMAL HIGH (ref 8–23)
CO2: 27 mmol/L (ref 22–32)
CO2: 27 mmol/L (ref 22–32)
Calcium: 8.3 mg/dL — ABNORMAL LOW (ref 8.9–10.3)
Calcium: 8.9 mg/dL (ref 8.9–10.3)
Chloride: 92 mmol/L — ABNORMAL LOW (ref 98–111)
Chloride: 93 mmol/L — ABNORMAL LOW (ref 98–111)
Creatinine, Ser: 1.56 mg/dL — ABNORMAL HIGH (ref 0.44–1.00)
Creatinine, Ser: 1.56 mg/dL — ABNORMAL HIGH (ref 0.44–1.00)
GFR, Estimated: 33 mL/min — ABNORMAL LOW (ref >60)
GFR, Estimated: 33 mL/min — ABNORMAL LOW (ref >60)
Glucose, Bld: 104 mg/dL — ABNORMAL HIGH (ref 70–99)
Glucose, Bld: 146 mg/dL — ABNORMAL HIGH (ref 70–99)
Potassium: 4.2 mmol/L (ref 3.5–5.1)
Potassium: 3.8 mmol/L (ref 3.5–5.1)
Sodium: 126 mmol/L — ABNORMAL LOW (ref 135–145)
Sodium: 129 mmol/L — ABNORMAL LOW (ref 135–145)

## 2024-03-01 LAB — HEPATIC FUNCTION PANEL
ALT: 34 U/L (ref 0–44)
AST: 31 U/L (ref 15–41)
Albumin: 2.6 g/dL — ABNORMAL LOW (ref 3.5–5.0)
Alkaline Phosphatase: 54 U/L (ref 38–126)
Bilirubin, Direct: 0.4 mg/dL — ABNORMAL HIGH (ref 0.0–0.2)
Indirect Bilirubin: 1 mg/dL — ABNORMAL HIGH (ref 0.3–0.9)
Total Bilirubin: 1.4 mg/dL — ABNORMAL HIGH (ref 0.0–1.2)
Total Protein: 5 g/dL — ABNORMAL LOW (ref 6.5–8.1)

## 2024-03-01 LAB — CBC
HCT: 25.1 % — ABNORMAL LOW (ref 36.0–46.0)
Hemoglobin: 8.2 g/dL — ABNORMAL LOW (ref 12.0–15.0)
MCH: 29.3 pg (ref 26.0–34.0)
MCHC: 32.7 g/dL (ref 30.0–36.0)
MCV: 89.6 fL (ref 80.0–100.0)
Platelets: 190 10*3/uL (ref 150–400)
RBC: 2.8 MIL/uL — ABNORMAL LOW (ref 3.87–5.11)
RDW: 17.2 % — ABNORMAL HIGH (ref 11.5–15.5)
WBC: 9 10*3/uL (ref 4.0–10.5)
nRBC: 0 % (ref 0.0–0.2)

## 2024-03-01 LAB — SODIUM: Sodium: 132 mmol/L — ABNORMAL LOW (ref 135–145)

## 2024-03-01 LAB — MAGNESIUM: Magnesium: 2.2 mg/dL (ref 1.7–2.4)

## 2024-03-01 MED ORDER — TOLVAPTAN 15 MG PO TABS
15.0000 mg | ORAL_TABLET | Freq: Once | ORAL | Status: AC
  Administered 2024-03-01: 15 mg via ORAL
  Filled 2024-03-01: qty 1

## 2024-03-01 MED ORDER — DAPAGLIFLOZIN PROPANEDIOL 10 MG PO TABS
10.0000 mg | ORAL_TABLET | Freq: Every day | ORAL | Status: DC
  Administered 2024-03-01 – 2024-03-08 (×8): 10 mg via ORAL
  Filled 2024-03-01 (×8): qty 1

## 2024-03-01 MED ORDER — POTASSIUM CHLORIDE CRYS ER 20 MEQ PO TBCR
40.0000 meq | EXTENDED_RELEASE_TABLET | Freq: Once | ORAL | Status: AC
  Administered 2024-03-01: 40 meq via ORAL
  Filled 2024-03-01: qty 2

## 2024-03-01 NOTE — Progress Notes (Signed)
 New order received for this patient, patient already on caseload. We will continue to follow.  Merryl Abraham OT Acute Rehabilitation Services Office [phone] '

## 2024-03-01 NOTE — TOC CM/SW Note (Addendum)
 Patient is confined to a part of the home that does not have a bathroom. She will need a 3n1 beside commode. [name] CM/Deandra CSW -HF Team Following

## 2024-03-01 NOTE — Plan of Care (Signed)

## 2024-03-01 NOTE — Progress Notes (Signed)
 [name] is an 83 y.o. female with a past medical history HTN and PAF who presents with shortness of breath with severe mitral regurgitation status post mitral valve replacement, MAZE, and LAA clipping.  Course complicated by systolic heart failure.  We were consulted for hyponatremia.   Assessment/Plan: Hypervolemic hyponatremia: Worsening and persistent.  No obvious symptoms.  Poor appetite for months now is certainly a reset osmostat could be playing a factor as well.  Likely multifactorial but certainly volume excess. - IV Lasix  80 mg 2 times Sunday and given shortness of breath, 2+ edema now with Unna boots, slow improvement in sodium s/p 1 dose on 4/28 -> now transitioned to torsemide . -Stopped the  SGLT2 with worsening renal function. 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 Change from Ure-Na to NaCl tablets. BP very soft but better than yest. Will take advantage of no diuretics now and give Tolvaptan . Hold the diuretics for today as well. She is on fluid restriction but knows to drink if thirsty.  Nonoliguric AKI: Creatinine has been elevated for most of the hospitalization but elevation has been fairly mild.  Likely some degree of ischemic injury during surgery.  Cholesterol embolic injury is always possible.  Also may just have lower GFR in the setting of her heart failure with new cardiorenal syndrome.  Torsemide  given yest; repeat urine studies. Will continue to monitor with diuresis; a little worse and changed from Ure-Na to NaCl tablets.  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  Followed by heart failure team previously.  Continue with diuresis as above.  Continue with goal-directed medical therapy which has already been initiated.  Monitor low blood pressures closely. Started SGLT2i which has efficacy in CHF  but also in hyponatremia mgmt   Atrial fibrillation with RVR: Management per primary team   Hypokalemia: On spironolactone .  Supplement as needed  Subjective: Breathing much better after thoracentesis, intermittent cough but denies any nausea, headaches, severe pain.  Difficulty swallowing but tolerating softer items (unable to chew and swallow the toast), dizziness with ambulation.     Chemistry and CBC: Creatinine, Ser  Date/Time Value Ref Range Status  03/01/2024 03:49 AM 1.56 (H) 0.44 - 1.00 mg/dL Final  40/98/1191 47:82 AM 1.57 (H) 0.44 - 1.00 mg/dL Final  95/62/1308 65:78 PM 1.47 (H) 0.44 - 1.00 mg/dL Final  46/96/2952 84:13 AM 1.41 (H) 0.44 - 1.00 mg/dL Final  24/40/1027 25:36 PM 1.35 (H) 0.44 - 1.00 mg/dL Final  64/40/3474 25:95 AM 1.23 (H) 0.44 - 1.00 mg/dL Final  63/87/5643 32:95 PM 1.26 (H) 0.44 - 1.00 mg/dL Final  18/84/1660 63:01 PM 1.37 (H) 0.44 - 1.00 mg/dL Final  60/08/9322 55:73 AM 1.35 (H) 0.44 - 1.00 mg/dL Final  22/12/5425 06:23 AM 1.25 (H) 0.44 - 1.00 mg/dL Final  76/28/3151 76:16 AM 1.40 (H) 0.44 - 1.00 mg/dL Final  07/37/1062 69:48 AM 1.33 (H) 0.44 - 1.00 mg/dL Final  54/62/7035 00:93 AM 1.28 (H) 0.44 - 1.00 mg/dL Final  81/82/9937 16:96 PM 1.37 (H) 0.44 - 1.00 mg/dL Final  78/93/8101 75:10 AM 1.44 (H) 0.44 - 1.00 mg/dL Final  25/85/2778 24:23 PM 1.48 (H) 0.44 - 1.00 mg/dL Final  53/61/4431 54:00 AM 1.47 (H) 0.44 - 1.00 mg/dL Final  86/76/1950 93:26 PM 1.41 (H) 0.44 - 1.00 mg/dL Final  71/24/5809 98:33 AM 1.39 (H) 0.44 - 1.00  mg/dL Final  13/06/6577 46:96 PM 1.31 (H) 0.44 - 1.00 mg/dL Final  29/52/8413 24:40 AM 1.41 (H) 0.44 - 1.00 mg/dL Final  08/28/2535 64:40 PM 1.49 (H) 0.44 - 1.00 mg/dL Final  34/74/2595 63:87 AM 1.46 (H) 0.44 - 1.00 mg/dL Final  56/43/3295 18:84 PM 1.54 (H) 0.44 - 1.00 mg/dL Final  16/60/6301 60:10 AM 1.48 (H) 0.44 - 1.00 mg/dL Final  93/23/5573 22:02 AM 1.34 (H) 0.44 - 1.00 mg/dL Final  54/27/0623 76:28 PM 1.28 (H) 0.44 - 1.00 mg/dL  Final  31/51/7616 07:37 AM 1.29 (H) 0.44 - 1.00 mg/dL Final  10/62/6948 54:62 AM 1.15 (H) 0.44 - 1.00 mg/dL Final  70/35/0093 81:82 AM 1.13 (H) 0.44 - 1.00 mg/dL Final  99/37/1696 78:93 AM 1.22 (H) 0.44 - 1.00 mg/dL Final  81/11/7508 25:85 AM 1.18 (H) 0.44 - 1.00 mg/dL Final  27/78/2423 53:61 AM 1.17 (H) 0.44 - 1.00 mg/dL Final  44/31/5400 86:76 PM 1.27 (H) 0.44 - 1.00 mg/dL Final  19/50/9326 71:24 AM 0.91 0.44 - 1.00 mg/dL Final  58/07/9832 82:50 PM 0.86 0.44 - 1.00 mg/dL Final  53/97/6734 19:37 AM 0.90 0.44 - 1.00 mg/dL Final  90/24/0973 53:29 AM 0.90 0.44 - 1.00 mg/dL Final  92/42/6834 19:62 AM 0.80 0.44 - 1.00 mg/dL Final  22/97/9892 11:94 AM 0.90 0.44 - 1.00 mg/dL Final  17/40/8144 81:85 AM 1.00 0.44 - 1.00 mg/dL Final  63/14/9702 63:78 AM 1.15 (H) 0.44 - 1.00 mg/dL Final  58/85/0277 41:28 AM 0.81 0.44 - 1.00 mg/dL Final  78/67/6720 94:70 PM 0.76 0.44 - 1.00 mg/dL Final  96/28/3662 94:76 PM 0.95 0.57 - 1.00 mg/dL Final  54/65/0354 65:68 AM 1.13 (H) 0.57 - 1.00 mg/dL Final  12/75/1700 17:49 AM 0.88 0.44 - 1.00 mg/dL Final  44/96/7591 63:84 AM 0.88 0.44 - 1.00 mg/dL Final  66/59/9357 01:77 PM 1.12 0.40 - 1.20 mg/dL Final  93/90/3009 23:30 AM 0.73 0.50 - 1.10 mg/dL Final  07/62/2633 35:45 AM 0.78 0.50 - 1.10 mg/dL Final  62/56/3893 73:42 PM 0.83 0.50 - 1.10 mg/dL Final   Recent Labs  Lab 02/26/24 2200 02/27/24 0755 02/27/24 1711 02/28/24 0447 02/28/24 1500 02/29/24 0447 03/01/24 0349  NA 122* 123* 121* 124* 123* 124* 126*  K 4.2 4.1 3.7 3.4* 3.8 4.2 4.2  CL 86* 85* 84* 87* 86* 88* 92*  CO2 25 26 26 27 27 27 27   GLUCOSE 119* 118* 123* 102* 114* 97 104*  BUN 32* 52* 46* 60* 68* 68* 59*  CREATININE 1.26* 1.23* 1.35* 1.41* 1.47* 1.57* 1.56*  CALCIUM  8.1* 8.5* 8.2* 7.9* 8.4* 7.9* 8.3*   Recent Labs  Lab 02/24/24 0425 02/29/24 0447 03/01/24 0349  WBC 12.9* 9.4 9.0  HGB 8.3* 8.6* 8.2*  HCT 25.4* 25.9* 25.1*  MCV 88.8 88.7 89.6  PLT 284 214 190   Liver Function  Tests: No results for input(s): "AST", "ALT", "ALKPHOS", "BILITOT", "PROT", "ALBUMIN " in the last 168 hours. No results for input(s): "LIPASE", "AMYLASE" in the last 168 hours. No results for input(s): "AMMONIA" in the last 168 hours. Cardiac Enzymes: No results for input(s): "CKTOTAL", "CKMB", "CKMBINDEX", "TROPONINI" in the last 168 hours. Iron  Studies: No results for input(s): "IRON ", "TIBC", "TRANSFERRIN", "FERRITIN" in the last 72 hours. PT/INR: @LABRCNTIP (inr:5)  Xrays/Other Studies: ) Results for orders placed or performed during the hospital encounter of 02/08/24 (from the past 48 hours)  Basic metabolic panel     Status: Abnormal   Collection Time: 02/28/24  3:00 PM  Result Value Ref Range   Sodium  123 (L) 135 - 145 mmol/L   Potassium 3.8 3.5 - 5.1 mmol/L   Chloride 86 (L) 98 - 111 mmol/L   CO2 27 22 - 32 mmol/L   Glucose, Bld 114 (H) 70 - 99 mg/dL    Comment: Glucose reference range applies only to samples taken after fasting for at least 8 hours.   BUN 68 (H) 8 - 23 mg/dL   Creatinine, Ser 1.61 (H) 0.44 - 1.00 mg/dL   Calcium  8.4 (L) 8.9 - 10.3 mg/dL   GFR, Estimated 36 (L) >60 mL/min    Comment: (NOTE) Calculated using the CKD-EPI Creatinine Equation (2021)    Anion gap 10 5 - 15    Comment: Performed at [facility], [address] [place], [place] [address]  Basic metabolic panel     Status: Abnormal   Collection Time: 02/29/24  4:47 AM  Result Value Ref Range   Sodium 124 (L) 135 - 145 mmol/L   Potassium 4.2 3.5 - 5.1 mmol/L   Chloride 88 (L) 98 - 111 mmol/L   CO2 27 22 - 32 mmol/L   Glucose, Bld 97 70 - 99 mg/dL    Comment: Glucose reference range applies only to samples taken after fasting for at least 8 hours.   BUN 68 (H) 8 - 23 mg/dL   Creatinine, Ser 5.40 (H) 0.44 - 1.00 mg/dL   Calcium  7.9 (L) 8.9 - 10.3 mg/dL   GFR, Estimated 33 (L) >60 mL/min    Comment: (NOTE) Calculated using the CKD-EPI Creatinine Equation (2021)    Anion gap 9 5 - 15     Comment: Performed at [facility], [address], [place], [place] [address]  Magnesium      Status: None   Collection Time: 02/29/24  4:47 AM  Result Value Ref Range   Magnesium  2.2 1.7 - 2.4 mg/dL    Comment: Performed at [facility], [address], [place], [place] [address]  CBC     Status: Abnormal   Collection Time: 02/29/24  4:47 AM  Result Value Ref Range   WBC 9.4 4.0 - 10.5 K/uL   RBC 2.92 (L) 3.87 - 5.11 MIL/uL   Hemoglobin 8.6 (L) 12.0 - 15.0 g/dL   HCT 95.6 (L) 21.3 - 08.6 %   MCV 88.7 80.0 - 100.0 fL   MCH 29.5 26.0 - 34.0 pg   MCHC 33.2 30.0 - 36.0 g/dL   RDW 57.8 (H) 46.9 - 62.9 %   Platelets 214 150 - 400 K/uL   nRBC 0.0 0.0 - 0.2 %    Comment: Performed at [facility], [address], [place], [place] [address]  Osmolality, urine     Status: None   Collection Time: 02/29/24 10:15 AM  Result Value Ref Range   Osmolality, Ur 351 300 - 900 mOsm/kg    Comment: REPEATED TO VERIFY Performed at [facility], [address], [place], [place] [address]   Na and K (sodium & potassium), rand urine     Status: None   Collection Time: 02/29/24 10:15 AM  Result Value Ref Range   Sodium, Ur <10 mmol/L   Potassium Urine 20 mmol/L    Comment: Performed at [facility], [address] [place], [place] [address]  Creatinine, urine, random     Status: None   Collection Time: 02/29/24 10:15 AM  Result Value Ref Range   Creatinine, Urine 30 mg/dL  Comment: Performed at [facility], [address], [place], [place] [address]  Basic metabolic panel     Status: Abnormal   Collection Time: 03/01/24  3:49 AM  Result Value Ref Range   Sodium 126 (L) 135 - 145 mmol/L   Potassium 4.2 3.5 - 5.1 mmol/L   Chloride 92 (L) 98 - 111 mmol/L   CO2 27 22 - 32 mmol/L   Glucose, Bld 104 (H) 70 - 99 mg/dL    Comment: Glucose reference range applies only to samples taken after fasting for at least 8 hours.   BUN 59 (H) 8 - 23 mg/dL   Creatinine,  Ser 3.32 (H) 0.44 - 1.00 mg/dL   Calcium  8.3 (L) 8.9 - 10.3 mg/dL   GFR, Estimated 33 (L) >60 mL/min    Comment: (NOTE) Calculated using the CKD-EPI Creatinine Equation (2021)    Anion gap 7 5 - 15    Comment: Performed at [facility], [address] [place], [place] [address]  Magnesium      Status: None   Collection Time: 03/01/24  3:49 AM  Result Value Ref Range   Magnesium  2.2 1.7 - 2.4 mg/dL    Comment: Performed at [facility], [address] [place], [place] [address]  CBC     Status: Abnormal   Collection Time: 03/01/24  3:49 AM  Result Value Ref Range   WBC 9.0 4.0 - 10.5 K/uL   RBC 2.80 (L) 3.87 - 5.11 MIL/uL   Hemoglobin 8.2 (L) 12.0 - 15.0 g/dL   HCT 63.0 (L) 16.0 - 10.9 %   MCV 89.6 80.0 - 100.0 fL   MCH 29.3 26.0 - 34.0 pg   MCHC 32.7 30.0 - 36.0 g/dL   RDW 32.3 (H) 55.7 - 32.2 %   Platelets 190 150 - 400 K/uL   nRBC 0.0 0.0 - 0.2 %    Comment: Performed at [facility], [address], [place], [place] [address]   DG Chest 2 View Result Date: 02/29/2024 CLINICAL DATA:  [id number] Pleural effusion [id number] EXAM: CHEST - 2 VIEW COMPARISON:  None available. FINDINGS: Right PICC terminates at the cavoatrial junction. Patchy airspace opacities in both lung bases. Small bilateral pleural effusions. No pneumothorax. Mitral valve annuloplasty with left atrial appendage clip. No acute fracture or destructive lesion. IMPRESSION: 1. Patchy airspace opacities in both lung bases, likely atelectasis, with small bilateral pleural effusions, unchanged. 2. Right PICC terminates at the cavoatrial junction. Electronically Signed   By: Rance Burrows M.D.   On: 02/29/2024 09:42    PMH:   Past Medical History:  Diagnosis Date   Acid reflux    Anemia    Arthritis    Atrial fibrillation (HCC)    Cataract    Diverticulosis of colon    Endometrial cancer (HCC)    1A grade endometrial ca   Essential hypertension 01/24/2009   Qualifier: Diagnosis of  By: Nelson-Smith  CMA (AAMA), Dottie     Hx of radiation therapy 04/24/08-05/29/08& 8/12/,8/19,&06/27/08   external beam  through 7/09 ,then intracavity in 06/2008   Hypertension    Mitral valve prolapse    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   Tricuspid regurgitation     PSH:   Past Surgical History:  Procedure Laterality Date   APPENDECTOMY     1967   arthroscopic knee right surgery     CLIPPING OF ATRIAL APPENDAGE N/A 02/08/2024   Procedure: CLIPPING, LEFT ATRIAL APPENDAGE USING A  MEDTRONIC CLIP;  Surgeon: Melene Sportsman, MD;  Location: [facility] OR;  Service: Open Heart Surgery;  Laterality: N/A;   ECTOPIC PREGNANCY SURGERY  1967   IR THORACENTESIS ASP PLEURAL SPACE W/IMG GUIDE  02/27/2024   iron  infusion  12/2022   LAPAROSCOPIC HYSTERECTOMY     total   MAZE N/A 02/08/2024   Procedure: MAZE PROCEDURE;  Surgeon: Melene Sportsman, MD;  Location: [facility] OR;  Service: Open Heart Surgery;  Laterality: N/A;   MITRAL VALVE REPAIR N/A 02/08/2024   Procedure: MITRAL VALVE REPAIR USING A SIMULUS SEMI-RIGID ANNULOPLASTY BAND;  Surgeon: Melene Sportsman, MD;  Location: [facility] OR;  Service: Open Heart Surgery;  Laterality: N/A;  possible replacement   ORTHOPEDIC SURGERY     Orthoscopic Knee Surgery   RIGHT/LEFT HEART CATH AND CORONARY ANGIOGRAPHY N/A 12/30/2023   Procedure: RIGHT/LEFT HEART CATH AND CORONARY ANGIOGRAPHY;  Surgeon: [name], MD;  Location: [facility] INVASIVE CV LAB;  Service: Cardiovascular;  Laterality: N/A;   TEAR DUCT PROBING     TEE WITHOUT CARDIOVERSION N/A 02/08/2024   Procedure: ECHOCARDIOGRAM, TRANSESOPHAGEAL;  Surgeon: Melene Sportsman, MD;  Location: [facility] OR;  Service: Open Heart Surgery;  Laterality: N/A;   TOOTH EXTRACTION     bone graft   TRANSESOPHAGEAL ECHOCARDIOGRAM (CATH LAB) N/A 12/30/2023   Procedure: TRANSESOPHAGEAL ECHOCARDIOGRAM;  Surgeon: Euell Herrlich, MD;  Location: [facility] INVASIVE CV LAB;  Service: Cardiovascular;  Laterality: N/A;    Allergies:  Allergies  Allergen Reactions   Crestor  [Rosuvastatin] Other (See Comments)    Leg cramps   Glycopyrrolate      Other reaction(s): extreme dry mouth   Codeine Other (See Comments)    Hyper    Medications:   Prior to Admission medications   Medication Sig Start Date End Date Taking? Authorizing Provider  amiodarone  (PACERONE ) 200 MG tablet Take 1 tablet (200 mg total) by mouth daily. 01/13/24  Yes Meng, Hao, PA  apixaban  (ELIQUIS ) 5 MG TABS tablet Take 1 tablet (5 mg total) by mouth 2 (two) times daily. 01/26/24  Yes [name], MD  b complex vitamins tablet Take 1 tablet by mouth daily.     Yes [provider]  Bempedoic Acid -Ezetimibe  (NEXLIZET) 180-10 MG TABS Take 1 tablet by mouth daily.   Yes [provider]  CALCIUM  PO Take 650 mg by mouth daily.   Yes [provider]  Cholecalciferol (VITAMIN D3) 125 MCG (5000 UT) CAPS Take 5,000 Units by mouth daily.   Yes [provider]  denosumab  (PROLIA ) 60 MG/ML SOSY injection Inject 60 mg into the skin every 6 (six) months.   Yes [provider]  furosemide  (LASIX ) 20 MG tablet Take 1 tablet (20 mg total) by mouth daily. 01/12/24 01/11/25 Yes Melene Sportsman, MD  Garlic 1000 MG CAPS Take 1,000 mg by mouth daily.   Yes [provider]  Joneen Nelson Omega-3 300 MG CAPS Take 300 mg by mouth daily.   Yes [provider]  loperamide  (IMODIUM ) 2 MG capsule Take 1 capsule (2 mg total) by mouth 4 (four) times daily as needed for diarrhea or loose stools. 01/10/21  Yes Trish Furl, MD  metoprolol  succinate (TOPROL -XL) 25 MG 24 hr tablet Take 25 mg by mouth 2 (two) times daily.  Take additional tablet for palpitations   Yes [provider]  Multiple Vitamins-Minerals (MULTIVITAMINS THER. W/MINERALS) TABS Take 1 tablet by mouth daily.     Yes [provider]  pantoprazole  (PROTONIX ) 40 MG tablet Take 40 mg  by mouth daily.   Yes [provider]  potassium chloride  SA (KLOR-CON  M) 20 MEQ tablet Take 0.5 tablets (10 mEq  total) by mouth daily. 01/12/24  Yes Melene Sportsman, MD  Zinc 50 MG TABS Take 50 mg by mouth daily.   Yes [provider]  apixaban  (ELIQUIS ) 5 MG TABS tablet Take 1 tablet (5 mg total) by mouth 2 (two) times daily. 01/31/24   [name], MD  atorvastatin  (LIPITOR) 10 MG tablet Take 1 tablet (10 mg total) by mouth daily. Patient not taking: Reported on 01/25/2024 12/20/23 03/19/24  [name], MD  ezetimibe  (ZETIA ) 10 MG tablet Take 1 tablet (10 mg total) by mouth daily. Patient not taking: Reported on 01/25/2024 12/20/23 03/19/24  [name], MD    Discontinued Meds:   Medications Discontinued During This Encounter  Medication Reason   EPINEPHrine  (ADRENALIN ) 5 mg in NS 250 mL (0.02 mg/mL) premix infusion    milrinone  (PRIMACOR ) 20 MG/100 ML (0.2 mg/mL) infusion    nitroGLYCERIN  50 mg in dextrose  5 % 250 mL (0.2 mg/mL) infusion    norepinephrine  (LEVOPHED ) 4mg  in (0.016 mg/mL) premix infusion    potassium chloride  injection 80 mEq    heparin  30,000 units/NS 1000 mL solution for CELLSAVER    heparin  sodium (porcine) 2,500 Units, papaverine 30 mg in electrolyte-A (PLASMALYTE-A PH 7.4) 500 mL irrigation    tranexamic acid  (CYKLOKAPRON ) pump prime solution 137 mg    vancomycin  (VANCOCIN ) 1,000 mg in sodium chloride  0.9 % 1,000 mL irrigation    [facility] Blood Cardioplegia vial (lidocaine /magnesium /mannitol  0.26g-4g-6.4g)    [facility] Blood Cardioplegia vial (lidocaine /magnesium /mannitol  0.26g-4g-6.4g)    [facility]    metoprolol  tartrate (LOPRESSOR ) tablet 12.5 mg    chlorhexidine  (HIBICLENS ) 4 % liquid 2 Application    0.9 % irrigation (POUR BTL)    vancomycin  (VANCOCIN ) 1,000 mg in sodium chloride  0.9 % 1,000 mL irrigation    Surgifoam 1 Gm with 0.9% sodium chloride  (4 ml) topical solution Patient Discharge   Bempedoic Acid -Ezetimibe  180-10 MG TABS 1 tablet Not available   sodium chloride  flush (NS) 0.9 % injection 3-10 mL    sodium  chloride flush (NS) 0.9 % injection 3-10 mL    sodium chloride  flush (NS) 0.9 % injection 3-10 mL    sodium chloride  flush (NS) 0.9 % injection 3-10 mL    sodium chloride  flush (NS) 0.9 % injection 3-10 mL    sodium chloride  flush (NS) 0.9 % injection 3-10 mL    acetaminophen  (TYLENOL ) 160 MG/5ML solution 1,000 mg Change in therapy   aspirin  chewable tablet 324 mg Change in therapy   metoprolol  tartrate (LOPRESSOR ) 25 mg/10 mL oral suspension 12.5 mg Change in therapy   0.45 % sodium chloride  infusion Change in therapy   acetaminophen  (TYLENOL ) 160 MG/5ML solution 650 mg    Oral care mouth rinse    Oral care mouth rinse    midazolam  (VERSED ) injection 2 mg    dexmedetomidine  (PRECEDEX ) 400 MCG/100ML (4 mcg/mL) infusion    nitroGLYCERIN  50 mg in dextrose  5 % 250 mL (0.2 mg/mL) infusion    amiodarone  (PACERONE ) tablet 400 mg    EPINEPHrine  (ADRENALIN ) 5 mg in NS 250 mL (0.02 mg/mL) premix infusion    potassium chloride  SA (KLOR-CON  M) CR tablet 20 mEq    amiodarone  (NEXTERONE ) 1.5 mg/mL IV bolus only 150 mg Entry Error   insulin  regular, human (MYXREDLIN ) 100 units/ 100 mL infusion    enoxaparin  (LOVENOX )  injection 30 mg    phenylephrine  (NEO-SYNEPHRINE) 20mg /NS 250mL premix infusion    metoprolol  tartrate (LOPRESSOR ) tablet 12.5 mg    aspirin  EC tablet 325 mg    vasopressin  (PITRESSIN) 20 Units in 100 mL (0.2 unit/mL) infusion-*FOR SHOCK*    amiodarone  (NEXTERONE  PREMIX) 360-4.14 MG/200ML-% (1.8 mg/mL) IV infusion    norepinephrine  (LEVOPHED ) 16 mg in (0.064 mg/mL) premix infusion    heparin  ADULT infusion 100 units/mL (25000 units/250mL)    amiodarone  (NEXTERONE ) 1.5 mg/mL IV bolus only 150 mg    apixaban  (ELIQUIS ) tablet 2.5 mg    amiodarone  (NEXTERONE ) 1.8 mg/mL load via infusion 150 mg    scopolamine  (TRANSDERM-SCOP) 1 MG/3DAYS 1.5 mg    amiodarone  (PACERONE ) tablet 400 mg Duplicate   furosemide  (LASIX ) injection 40 mg    amiodarone  (PACERONE ) tablet 400 mg    amiodarone   (NEXTERONE ) 1.5 mg/mL IV bolus only 150 mg    potassium chloride  10 mEq in 50 mL *CENTRAL LINE* IVPB    docusate sodium  (COLACE) capsule 200 mg    bisacodyl  (DULCOLAX) EC tablet 10 mg    bisacodyl  (DULCOLAX) suppository 10 mg    amiodarone  (NEXTERONE ) 1.5 mg/mL IV bolus only 150 mg    senna (SENOKOT) tablet 8.6 mg    potassium chloride  SA (KLOR-CON  M) CR tablet 40 mEq    ondansetron  (ZOFRAN ) injection 4 mg    digoxin  (LANOXIN ) 0.1 MG/ML injection 0.5 mg Change in therapy   digoxin  (LANOXIN ) 0.1 MG/ML injection 0.25 mg Change in therapy   bethanechol  (URECHOLINE ) tablet 5 mg Discontinued by provider   potassium chloride  (KLOR-CON ) packet 40 mEq    potassium chloride  10 mEq in 50 mL *CENTRAL LINE* IVPB    loperamide  HCl (IMODIUM ) 1 MG/7.5ML suspension 2 mg    potassium chloride  SA (KLOR-CON  M) CR tablet 40 mEq    potassium chloride  (KLOR-CON ) packet 40 mEq    potassium chloride  (KLOR-CON ) packet 40 mEq Duplicate   aspirin  chewable tablet 81 mg    traMADol  (ULTRAM ) tablet 50 mg    sodium chloride  flush (NS) 0.9 % injection 3 mL    sodium chloride  flush (NS) 0.9 % injection 10-40 mL    fentaNYL  (SUBLIMAZE ) injection 100 mcg    sodium chloride  flush (NS) 0.9 % injection 3-10 mL    sodium chloride  flush (NS) 0.9 % injection 10-40 mL    potassium chloride  SA (KLOR-CON  M) CR tablet 40 mEq    potassium chloride  (KLOR-CON ) packet 20 mEq    potassium chloride  10 mEq in 50 mL *CENTRAL LINE* IVPB    metoprolol  tartrate (LOPRESSOR ) injection 2.5-5 mg    digoxin  (LANOXIN ) tablet 0.125 mg    digoxin  (LANOXIN ) tablet 0.0625 mg    furosemide  (LASIX ) 200 mg in dextrose  5 % 100 mL (2 mg/mL) infusion    potassium chloride  SA (KLOR-CON  M) CR tablet 40 mEq    potassium chloride  SA (KLOR-CON  M) CR tablet 40 mEq    aspirin  EC tablet 81 mg    milrinone  (PRIMACOR ) 20 MG/100 ML (0.2 mg/mL) infusion    traMADol  (ULTRAM ) tablet 50 mg    amiodarone  (NEXTERONE  PREMIX) 360-4.14 MG/200ML-% (1.8 mg/mL) IV infusion     morphine  (PF) 2 MG/ML injection 1-4 mg    potassium chloride  SA (KLOR-CON  M) CR tablet 20 mEq    potassium chloride  (KLOR-CON  M) CR tablet 30 mEq    spironolactone  (ALDACTONE ) tablet 12.5 mg    potassium chloride  (KLOR-CON  M) CR tablet 30 mEq    amiodarone  (PACERONE )  tablet 400 mg    amiodarone  (PACERONE ) tablet 200 mg    dextrose  50 % solution 0-50 mL    insulin  aspart (novoLOG ) injection 2-6 Units    feeding supplement (BOOST / RESOURCE BREEZE) liquid 1 Container    sodium chloride  flush (NS) 0.9 % injection 3 mL    sodium chloride  flush (NS) 0.9 % injection 3 mL    0.9 %  sodium chloride  infusion    amiodarone  (PACERONE ) tablet 200 mg    digoxin  (LANOXIN ) tablet 0.0625 mg    torsemide  (DEMADEX ) tablet 40 mg    potassium chloride  SA (KLOR-CON  M) CR tablet 40 mEq    torsemide  (DEMADEX ) tablet 40 mg    urea  (URE-NA) oral packet 15 g    furosemide  (LASIX ) injection 80 mg    potassium chloride  SA (KLOR-CON  M) CR tablet 20 mEq    potassium chloride  SA (KLOR-CON  M) CR tablet 20 mEq    urea  (URE-NA) oral packet 30 g    losartan  (COZAAR ) tablet 12.5 mg    potassium chloride  SA (KLOR-CON  M) CR tablet 20 mEq    urea  (URE-NA) oral packet 15 g    dapagliflozin  propanediol (FARXIGA ) tablet 10 mg    potassium chloride  (KLOR-CON  M) CR tablet 30 mEq    torsemide  (DEMADEX ) tablet 40 mg    potassium chloride  SA (KLOR-CON  M) CR tablet 20 mEq     Social History:  reports that she has never smoked. She has never used smokeless tobacco. She reports current alcohol use. She reports that she does not use drugs.  Family History:   Family History  Problem Relation Age of Onset   Diabetes type II Mother    Breast cancer Mother    Leukemia Father    Diabetes type II Sister    Breast cancer Sister    Cancer Brother 91       throat 10/2020   Pancreatic cancer Maternal Aunt        x2   Diabetes Maternal Grandmother    Diabetes Maternal Grandfather    Colon cancer Neg Hx    Stomach cancer Neg  Hx    Esophageal cancer Neg Hx     Blood pressure (!) 101/56, pulse 76, temperature 97.8 F (36.6 C), temperature source Oral, resp. rate 18, height 5\' 4"  (1.626 m), weight 70.9 kg, SpO2 99%. Physical Exam: General: NAD HEENT: anicteric sclera CV: Normal rate, no rub, 2+ pitting edema in the bilateral lower extremities, UNNA boots Lungs: Mild increased work of breathing, trace rales Abd: SNDNT +BS Skin: no visible lesions or rashes Musculoskeletal: no obvious deformities Neuro: normal speech, no gross focal deficits  Ext: UNNA boots, 1+ edema     Anquinette Pierro, Alveda Aures, MD 03/01/2024, 8:18 AM

## 2024-03-01 NOTE — Progress Notes (Signed)
      [address]       [place] [address]             [phone]        22 Days Post-Op Procedure(s) (LRB): MITRAL VALVE REPAIR USING A SIMULUS SEMI-RIGID ANNULOPLASTY BAND (N/A) MAZE PROCEDURE (N/A) CLIPPING, LEFT ATRIAL APPENDAGE USING A MEDTRONIC CLIP (N/A) ECHOCARDIOGRAM, TRANSESOPHAGEAL (N/A)  Subjective: Patient sitting in chair without complaints this am.  Objective: Vital signs in last 24 hours: Temp:  [97.4 F (36.3 C)-98.2 F (36.8 C)] 98 F (36.7 C) (05/01 0343) Pulse Rate:  [73-79] 77 (05/01 0343) Cardiac Rhythm: Normal sinus rhythm (04/30 1900) Resp:  [13-28] 20 (05/01 0343) BP: (97-124)/(48-55) 99/48 (05/01 0343) SpO2:  [95 %-100 %] 95 % (05/01 0343) Weight:  [70.9 kg] 70.9 kg (05/01 0625)  Pre op weight 68.5 kg Current Weight  03/01/24 70.9 kg    Hemodynamic parameters for last 24 hours: CVP:  [4 mmHg-18 mmHg] 4 mmHg  Intake/Output from previous day: 04/30 0701 - 05/01 0700 In: 940 [P.O.:940] Out: 1445 [Urine:1445]   Physical Exam:  Cardiovascular: RRR Pulmonary: Slightly diminished bibasilar breath sounds Abdomen: Soft, non tender, bowel sounds present. Extremities: Unna boots Wound: Clean and dry.  No erythema or signs of infection.  Lab Results: CBC: Recent Labs    02/29/24 0447 03/01/24 0349  WBC 9.4 9.0  HGB 8.6* 8.2*  HCT 25.9* 25.1*  PLT 214 190   BMET:  Recent Labs    02/29/24 0447 03/01/24 0349  NA 124* 126*  K 4.2 4.2  CL 88* 92*  CO2 27 27  GLUCOSE 97 104*  BUN 68* 59*  CREATININE 1.57* 1.56*  CALCIUM  7.9* 8.3*    PT/INR:  Lab Results  Component Value Date   INR 1.6 (H) 02/08/2024   INR 1.0 02/07/2024   ABG:  INR: Will add last result for INR, ABG once components are confirmed Will add last 4 CBG results once components are confirmed  Assessment/Plan: CV-Has had persistent fib with RVR. S/p DCCV 04/18 (failed).  SR with HR in the 70's this am.  On Amiodarone  400 mg bid  and Apixaban  5 mg bid.  Pulmonary-On room air. S/p left thoracentesis 1.2 L removed 04/28. Encourage incentive spirometer. Acute systolic heart failure, non ischemic cardiomyopathy-on Spironolactone  25 mg daily, and Unna boots.  AHF following Expected post op blood loss anemia- H and H this am stable at 8.2 and 25.1 5. Hypervolemic hyponatremia, component of SIADH-sodium this am 126 (has been 123-124 mostly of late).  6. Non oliguric AKI-Creatinine this am is 1.56. She received Torsemide  40 mg bid and Diamox  500 mg yesterday. Creatinine prior to admission 1.15. 7. Deconditioned-continue PT. When ready for discharge, and per CIR note yesterday, home health recommended not CIR 8. Disposition-per notes, she is not a CIR candidate at current level. Per discussion with patient this am, she would consider [facility] but no other SNF and would prefer to go home, if able.  Dinia Joynt M ZimmermanPA-C 6:57 AM

## 2024-03-01 NOTE — Progress Notes (Signed)
 Mobility Specialist Progress Note:   03/01/24 1517  Mobility  Activity Ambulated with assistance in hallway  Level of Assistance Contact guard assist, steadying assist  Assistive Device Front wheel walker  Distance Ambulated (ft) 70 ft  RUE Weight Bearing Per Provider Order NWB  LUE Weight Bearing Per Provider Order NWB  Activity Response Tolerated well  Mobility Referral Yes  Mobility visit 1 Mobility  Mobility Specialist Start Time (ACUTE ONLY) 1400  Mobility Specialist Stop Time (ACUTE ONLY) 1418  Mobility Specialist Time Calculation (min) (ACUTE ONLY) 18 min   Pt received on BSC agreeable to mobility. Pt was able to practice sternal precautions w/o fault. No physical assistance needed. Returned to room w/o fault. Left in bed w/ call bell and personal belongings in reach. All needs met. Husband in room.  Inetta Manes Mobility Specialist  Please contact vis Secure Chat or  Rehab Office [phone]

## 2024-03-01 NOTE — Progress Notes (Signed)
 Advanced Heart Failure Rounding Note  Cardiologist: [name], MD   Chief Complaint: Heart Failure Subjective:    - S/p MVR 02/08/24. Post-op complicated by persistent a fib RVR and volume overload. - Failed DCCV on 4/18 - Back in SR 4/21. Diuretics stopped d/t hypochloremia, hypokalemia and hyponatremia. Given hypertonic saline. - Milrinone  stopped 4/22 - Left thoracentesis 4/28  Diuretics stopped yesterday. Given dose of Tolvaptan  by Nephrology.   CVP waveform flat.  Dyspnea improved. Has already ambulated today.    Objective:    Weight Range: 70.9 kg Body mass index is 26.83 kg/m.   Vital Signs:   Temp:  [97.4 F (36.3 C)-98.2 F (36.8 C)] 97.8 F (36.6 C) (05/01 0736) Pulse Rate:  [73-79] 76 (05/01 0736) Resp:  [13-28] 18 (05/01 0736) BP: (97-124)/(48-56) 101/56 (05/01 0736) SpO2:  [95 %-100 %] 99 % (05/01 0736) Weight:  [70.9 kg] 70.9 kg (05/01 0625) Last BM Date : 02/28/24  Weight change: Filed Weights   02/28/24 0307 02/29/24 0240 03/01/24 0625  Weight: 72.1 kg 71.4 kg 70.9 kg   Intake/Output:  Intake/Output Summary (Last 24 hours) at 03/01/2024 0810 Last data filed at 03/01/2024 0740 Gross per 24 hour  Intake 940 ml  Output 1370 ml  Net -430 ml   Physical Exam  General:  Sitting up in chair. No distress. Neck: JVP difficult but does not appear elevated Cor: Regular rate & rhythm. No rubs, gallops or murmurs. Lungs: Breathing nonlabored Abdomen: soft, nontender, nondistended.  Extremities: 1-2+ edema, + UNNA, RUE PICC Neuro: alert & orientedx3. Affect pleasant    Telemetry  SR 70s  Labs    CBC Recent Labs    02/29/24 0447 03/01/24 0349  WBC 9.4 9.0  HGB 8.6* 8.2*  HCT 25.9* 25.1*  MCV 88.7 89.6  PLT 214 190    Basic Metabolic Panel Recent Labs    16/10/96 0447 03/01/24 0349  NA 124* 126*  K 4.2 4.2  CL 88* 92*  CO2 27 27  GLUCOSE 97 104*  BUN 68* 59*  CREATININE 1.57* 1.56*  CALCIUM  7.9* 8.3*  MG 2.2 2.2   Imaging    DG Chest 2 View Result Date: 02/29/2024 CLINICAL DATA:  142230 Pleural effusion 142230 EXAM: CHEST - 2 VIEW COMPARISON:  None available. FINDINGS: Right PICC terminates at the cavoatrial junction. Patchy airspace opacities in both lung bases. Small bilateral pleural effusions. No pneumothorax. Mitral valve annuloplasty with left atrial appendage clip. No acute fracture or destructive lesion. IMPRESSION: 1. Patchy airspace opacities in both lung bases, likely atelectasis, with small bilateral pleural effusions, unchanged. 2. Right PICC terminates at the cavoatrial junction. Electronically Signed   By: Rance Burrows M.D.   On: 02/29/2024 09:42      Medications:    Scheduled Medications:  amiodarone   400 mg Oral BID   apixaban   5 mg Oral BID   atorvastatin   10 mg Oral Daily   Chlorhexidine  Gluconate Cloth  6 each Topical Daily    Cardiac Surgery, Patient & Family Education   Does not apply Once   ezetimibe   10 mg Oral Daily   feeding supplement  237 mL Oral BID BM   magnesium  gluconate  500 mg Oral Daily   melatonin  3 mg Oral QHS   multivitamin with minerals  1 tablet Oral Daily   pantoprazole   40 mg Oral Daily   sodium chloride  flush  3-10 mL Intravenous Q12H   sodium chloride   1 g Oral TID WC  spironolactone   25 mg Oral Daily   Infusions:  promethazine  (PHENERGAN ) injection (IM or IVPB) Stopped (02/20/24 0000)   PRN Medications: loperamide , ondansetron  (ZOFRAN ) IV, mouth rinse, oxyCODONE , phenol, polyethylene glycol, promethazine  (PHENERGAN ) injection (IM or IVPB), sodium chloride  flush, traMADol , witch hazel-glycerin   Patient Profile   [name] is a 83 y.o. female with HTN and PAF. Admitted with SOB> found to have severe MR. Now s/p MVR, MAZE and LAA clipping. AHF team consulted for acute systolic heart failure.   Assessment/Plan  Acute systolic heart failure, NiCM - Intra-op TEE 02/08/24 with EF 55-60% and severe MR - [facility] 2/25 with no CAD. RA 8, PA 41/17  (14), Fick CO/CI 7.76/4.41, PAPi 3, LVEDP 11 - Echo 4/25 with EF 35-40%, LV with GHK, RV normal, LA mod dilated, RA severely dilated, trivial MR, mod TR - Suspect drop in EF 2/2 persistent AF RVR.  - NYHA II-IIIa, IV on admission -  Volume improved. JVP difficult on exam. Diuretics on hold today. Getting Tolvaptan  per Nephrology (see below). - GDMT limited by soft SBP.   - Restart Farxiga  10 mg daily - Now off digoxin  - Continue spiro 25 mg daily - Remove UNNA boots today   Atrial fibrillation with RVR - S/p Maze and LAA clipping 4/25 - Failed DCCV X 2 04/17 - In and out of Afib this admit, SR today - Continue amiodarone  400 mg BID, reduce to 200 mg daily prior to discharge - Continue eliquis  5 mg BID.   Severe MR s/p MVR - Intra-op TEE with severe MR 02/08/24 - Trivial MR on echo 02/14/24  4. AKI - baseline Cr normal - Cr slowly trending up, 1.2>1.35>1.41>1.57>1.56 - Nephrology following. Suspect ischemic injury during surgery and cardiorenal syndrome  7. Hypokalemia - K 3.8  - Supp  8. Hypervolemic hyponatremia - Felt to be multifactorial, volume overload, component of SIADH -  Nephrology added Ure-Na>>switched to NaCl today - Slowly improving - Receiving dose of Tolvaptan  today  9. Left pleural effusion - S/p left thoracentesis on 04/28  10. Debility - Continue aggressive PT/OT. Considering SNF at [facility] PT at discharge    Length of Stay: [address], Payslie Mccaig N, PA-C  03/01/2024, 8:10 AM

## 2024-03-01 NOTE — Progress Notes (Signed)
   03/01/24 1631  Mobility  Activity Transferred to/from [facility];Ambulated with assistance in hallway  Level of Assistance Contact guard assist, steadying assist  Assistive Device Front wheel walker  Distance Ambulated (ft) 100 ft  RUE Weight Bearing Per Provider Order NWB  LUE Weight Bearing Per Provider Order NWB  Activity Response Tolerated fair  Mobility Referral Yes  Mobility visit 1 Mobility  Mobility Specialist Start Time (ACUTE ONLY) 1631  Mobility Specialist Stop Time (ACUTE ONLY) 1651  Mobility Specialist Time Calculation (min) (ACUTE ONLY) 20 min   Mobility Specialist: Progress Note  During Mobility: HR 92, BP 117/62 Post-Mobility:    HR 80  Pt requesting additional mobility session - received in bed. C/o dizziness, VSS - RN notified. Pt able to recall WB precautions. Void complete, pericare completed ind. Further mobility deferred by pt. Returned to chair with all needs met - call bell within reach.   Isla Mari, BS Mobility Specialist Please contact via SecureChat or  Rehab office at [phone].

## 2024-03-01 NOTE — Plan of Care (Signed)

## 2024-03-01 NOTE — Progress Notes (Signed)
 Physical Therapy Treatment Patient Details Name: [name] MRN: [id number] DOB: 1941/05/05 Today's Date: 03/01/2024   History of Present Illness 83 y.o. female presents to [facility] 02/08/24 for complex MVR and MAZE (via sternotomy). Post-op complicated by persistent a fib RVR and volume overload. Cardioversion x2 4/18 without resolution of afib.  4/21 returned to [facility]. 4/9-4/23 ICU stay. Plan for repeat DCCV 4/25. PMHx: anemia, HTN, mitral valve prolapse, moderate mitral regurgitation, tricuspid regurgitation    PT Comments  Pt in recliner upon arrival and agreeable to PT session. Pt and husband have been discussing d/c recommendations with either going home with HHPT or <3hrs post acute rehab. Focused today's session on teaching and demonstrating safe bed mobility and transfers with husband. Husband was able to demonstrate safe sit<>stand transfers and bed mobility with no reported issues. After lengthy discussion, pt and husband feel comfortable discharging home with husband assisting. Encouraged pt to continue ambulating at least x2 a day and to progress gait distance as able. Pt is progressing towards goals. Acute PT to follow.      If plan is discharge home, recommend the following: A little help with walking and/or transfers;A little help with bathing/dressing/bathroom;Assistance with cooking/housework;Assist for transportation;Help with stairs or ramp for entrance   Can travel by private vehicle      Yes  Equipment Recommendations  BSC/3in1       Precautions / Restrictions Precautions Precautions: Fall;Sternal Recall of Precautions/Restrictions: Impaired Precaution/Restrictions Comments: minimal cues needed to adhere to precautions with sit/stand transfer Restrictions Other Position/Activity Restrictions: sternal     Mobility  Bed Mobility Overal bed mobility: Needs Assistance Bed Mobility: Rolling, Sidelying to Sit, Sit to Sidelying Rolling: Contact guard assist Sidelying to sit:  Contact guard assist    Sit to sidelying: Mod assist General bed mobility comments: CGA for supine<>sit for safety, ModA for LE management upon return to sidelying. Husband able to demonstrate bed mobility    Transfers Overall transfer level: Needs assistance Equipment used: None Transfers: Sit to/from Stand Sit to Stand: Contact guard assist, Min assist  General transfer comment: MinA for slight boost, husband able to demonstrate safe transfer    Ambulation/Gait Ambulation/Gait assistance: Contact guard assist Gait Distance (Feet): 160 Feet Assistive device: Rolling walker (2 wheels) Gait Pattern/deviations: Step-through pattern, Decreased stride length Gait velocity: decr     General Gait Details: steady with no overt LOB    Balance Overall balance assessment: Needs assistance Sitting-balance support: No upper extremity supported, Feet supported Sitting balance-Leahy Scale: Fair     Standing balance support: No upper extremity supported Standing balance-Leahy Scale: Fair Standing balance comment: able to stand statically; RW for gait       Communication Communication Communication: Impaired Factors Affecting Communication: Hearing impaired  Cognition Arousal: Alert Behavior During Therapy: WFL for tasks assessed/performed, Flat affect   PT - Cognitive impairments: Memory      PT - Cognition Comments: needs cues to scoot forwards before attempting to stand and to keep hands in lap when moving from stand to sit Following commands: Intact      Cueing Cueing Techniques: Verbal cues  Exercises General Exercises - Lower Extremity Hip Flexion/Marching: AROM, Both, 10 reps, Standing    General Comments General comments (skin integrity, edema, etc.): Husband present during session with PT demonstrating safe mobliity and husband able to replicate      Pertinent Vitals/Pain Pain Assessment Pain Assessment: Faces Faces Pain Scale: Hurts a little bit Pain Location:  sternal incision Pain Descriptors / Indicators:  Sore Pain Intervention(s): Limited activity within patient's tolerance, Monitored during session, Repositioned     PT Goals (current goals can now be found in the care plan section) Acute Rehab PT Goals PT Goal Formulation: With patient/family Time For Goal Achievement: 03/08/24 Potential to Achieve Goals: Good Progress towards PT goals: Progressing toward goals    Frequency    Min 2X/week       AM-PAC PT "6 Clicks" Mobility   Outcome Measure  Help needed turning from your back to your side while in a flat bed without using bedrails?: A Little Help needed moving from lying on your back to sitting on the side of a flat bed without using bedrails?: A Little Help needed moving to and from a bed to a chair (including a wheelchair)?: A Little Help needed standing up from a chair using your arms (e.g., wheelchair or bedside chair)?: A Little Help needed to walk in hospital room?: A Little Help needed climbing 3-5 steps with a railing? : A Little 6 Click Score: 18    End of Session Equipment Utilized During Treatment: Gait belt Activity Tolerance: Patient tolerated treatment well Patient left: in bed;with call bell/phone within reach;with family/visitor present Nurse Communication: Mobility status PT Visit Diagnosis: Unsteadiness on feet (R26.81);Muscle weakness (generalized) (M62.81);Other abnormalities of gait and mobility (R26.89)     Time: 6045-4098 PT Time Calculation (min) (ACUTE ONLY): 38 min  Charges:    $Gait Training: 8-22 mins $Therapeutic Exercise: 8-22 mins $Therapeutic Activity: 8-22 mins PT General Charges $$ ACUTE PT VISIT: 1 Visit                    [name], PT, DPT Secure Chat Preferred  Rehab Office [phone]   [name] 03/01/2024, 9:47 AM

## 2024-03-01 NOTE — Telephone Encounter (Signed)
 I want to see her back in 6 weeks post Mitral valve surgery

## 2024-03-02 ENCOUNTER — Inpatient Hospital Stay (HOSPITAL_COMMUNITY)

## 2024-03-02 ENCOUNTER — Ambulatory Visit: Admitting: Physician Assistant

## 2024-03-02 LAB — BASIC METABOLIC PANEL WITH GFR
Anion gap: 9 (ref 5–15)
BUN: 37 mg/dL — ABNORMAL HIGH (ref 8–23)
CO2: 26 mmol/L (ref 22–32)
Calcium: 8.5 mg/dL — ABNORMAL LOW (ref 8.9–10.3)
Chloride: 98 mmol/L (ref 98–111)
Creatinine, Ser: 1.51 mg/dL — ABNORMAL HIGH (ref 0.44–1.00)
GFR, Estimated: 35 mL/min — ABNORMAL LOW (ref >60)
Glucose, Bld: 107 mg/dL — ABNORMAL HIGH (ref 70–99)
Potassium: 4.3 mmol/L (ref 3.5–5.1)
Sodium: 133 mmol/L — ABNORMAL LOW (ref 135–145)

## 2024-03-02 LAB — CBC
HCT: 25.3 % — ABNORMAL LOW (ref 36.0–46.0)
Hemoglobin: 8.2 g/dL — ABNORMAL LOW (ref 12.0–15.0)
MCH: 29.6 pg (ref 26.0–34.0)
MCHC: 32.4 g/dL (ref 30.0–36.0)
MCV: 91.3 fL (ref 80.0–100.0)
Platelets: 187 10*3/uL (ref 150–400)
RBC: 2.77 MIL/uL — ABNORMAL LOW (ref 3.87–5.11)
RDW: 17.1 % — ABNORMAL HIGH (ref 11.5–15.5)
WBC: 8.5 10*3/uL (ref 4.0–10.5)
nRBC: 0 % (ref 0.0–0.2)

## 2024-03-02 LAB — MAGNESIUM: Magnesium: 2.3 mg/dL (ref 1.7–2.4)

## 2024-03-02 MED ORDER — TORSEMIDE 20 MG PO TABS
40.0000 mg | ORAL_TABLET | Freq: Once | ORAL | Status: AC
  Administered 2024-03-02: 40 mg via ORAL
  Filled 2024-03-02: qty 2

## 2024-03-02 MED ORDER — POTASSIUM CHLORIDE CRYS ER 20 MEQ PO TBCR
40.0000 meq | EXTENDED_RELEASE_TABLET | Freq: Once | ORAL | Status: AC
  Administered 2024-03-02: 40 meq via ORAL
  Filled 2024-03-02: qty 2

## 2024-03-02 NOTE — Progress Notes (Signed)
 Occupational Therapy Treatment Patient Details Name: [name] MRN: [id number] DOB: 10-Mar-1941 Today's Date: 03/02/2024   History of present illness 83 y.o. female presents to [facility] 02/08/24 for complex MVR and MAZE (via sternotomy). Post-op complicated by persistent a fib RVR and volume overload. Cardioversion x2 4/18 without resolution of afib.  4/21 returned to [facility]. 4/9-4/23 ICU stay. Plan for repeat DCCV 4/25. PMHx: anemia, HTN, mitral valve prolapse, moderate mitral regurgitation, tricuspid regurgitation   OT comments  Pt received OOB in chair and agreeable to session. Pt initially requesting to use BSC, stood and transferred with CGA/minA and void successful. Pt requiring assist with peri care. Following pt ambulating short distance to sink with RW and sat at sink to complete UB/LB bathing, UB dressing and self care tasks. Set up to supervision throughout seated at sink. Pt returned to recliner and left with all needs met. Pt adhering to sternal precautions well. Acute OT to continue to follow to address established goals to facilitate DC to next venue of care.        If plan is discharge home, recommend the following:  A little help with walking and/or transfers;A lot of help with bathing/dressing/bathroom;Assistance with cooking/housework;Assist for transportation;Help with stairs or ramp for entrance   Equipment Recommendations  BSC/3in1    Recommendations for Other Services      Precautions / Restrictions Precautions Precautions: Fall;Sternal Precaution Booklet Issued: Yes (comment) Recall of Precautions/Restrictions: Intact Restrictions Weight Bearing Restrictions Per Provider Order: No RUE Weight Bearing Per Provider Order: Non weight bearing LUE Weight Bearing Per Provider Order: Non weight bearing Other Position/Activity Restrictions: sternal       Mobility Bed Mobility               General bed mobility comments: OOB in chair    Transfers Overall transfer  level: Needs assistance   Transfers: Sit to/from Stand Sit to Stand: Contact guard assist, Min assist           General transfer comment: assist for power up     Balance Overall balance assessment: Needs assistance Sitting-balance support: No upper extremity supported, Feet supported Sitting balance-Leahy Scale: Fair     Standing balance support: No upper extremity supported Standing balance-Leahy Scale: Fair Standing balance comment: stood statically for peri care                           ADL either performed or assessed with clinical judgement   ADL Overall ADL's : Needs assistance/impaired     Grooming: Wash/dry face;Oral care;Brushing hair;Set up;Supervision/safety;Sitting Grooming Details (indicate cue type and reason): completed seated at sink with set up-supervision Upper Body Bathing: Set up;Supervision/ safety;Sitting Upper Body Bathing Details (indicate cue type and reason): seated at sink, done with wipes Lower Body Bathing: Set up;Supervison/ safety;Sitting/lateral leans Lower Body Bathing Details (indicate cue type and reason): seated at sink Upper Body Dressing : Minimal assistance;Sitting Upper Body Dressing Details (indicate cue type and reason): to don gown     Toilet Transfer: Contact guard assist;Ambulation;BSC/3in1;Rolling walker (2 wheels) Toilet Transfer Details (indicate cue type and reason): to [facility] beside recliner Toileting- Clothing Manipulation and Hygiene: Minimal assistance;Sit to/from stand Toileting - Clothing Manipulation Details (indicate cue type and reason): manages clothing but assist with peri care required     Functional mobility during ADLs: Contact guard assist;Rolling walker (2 wheels) General ADL Comments: pt seated at sink with SOB at start of session but was not limited  Extremity/Trunk Assessment              Occupational psychologist Communication:  Impaired Factors Affecting Communication: Hearing impaired   Cognition Arousal: Alert Behavior During Therapy: WFL for tasks assessed/performed Cognition: No apparent impairments                               Following commands: Intact        Cueing   Cueing Techniques: Verbal cues  Exercises      Shoulder Instructions       General Comments VSS on RA    Pertinent Vitals/ Pain       Pain Assessment Pain Assessment: No/denies pain Pain Location: no c/o pain but pt with c/o SOB upon entry, still eager to get up Pain Intervention(s): Monitored during session, Limited activity within patient's tolerance  Home Living                                          Prior Functioning/Environment              Frequency  Min 2X/week        Progress Toward Goals  OT Goals(current goals can now be found in the care plan section)  Progress towards OT goals: Progressing toward goals  Acute Rehab OT Goals Patient Stated Goal: none stated OT Goal Formulation: With patient Time For Goal Achievement: 03/09/24 Potential to Achieve Goals: Good ADL Goals Pt Will Perform Grooming: with supervision;standing Pt Will Perform Lower Body Bathing: with contact guard assist;sitting/lateral leans;sit to/from stand Pt Will Perform Upper Body Dressing: with supervision;sitting Pt Will Perform Lower Body Dressing: with contact guard assist;sitting/lateral leans;sit to/from stand Pt Will Transfer to Toilet: with supervision;ambulating;regular height toilet Pt Will Perform Toileting - Clothing Manipulation and hygiene: with supervision;sitting/lateral leans;sit to/from stand Additional ADL Goal #1: Patient will demonstrate ability to Independently state 4 energy conservation strategies to increase safety and independence with functional tasks with handout provided.  Plan      Co-evaluation                 AM-PAC OT "6 Clicks" Daily Activity      Outcome Measure   Help from another person eating meals?: A Little Help from another person taking care of personal grooming?: A Little Help from another person toileting, which includes using toliet, bedpan, or urinal?: A Lot Help from another person bathing (including washing, rinsing, drying)?: A Lot Help from another person to put on and taking off regular upper body clothing?: A Little Help from another person to put on and taking off regular lower body clothing?: A Lot 6 Click Score: 15    End of Session Equipment Utilized During Treatment: Rolling walker (2 wheels)  OT Visit Diagnosis: Other abnormalities of gait and mobility (R26.89);Muscle weakness (generalized) (M62.81);Other (comment)   Activity Tolerance Patient tolerated treatment well   Patient Left in chair;with call bell/phone within reach;with chair alarm set   Nurse Communication Mobility status        Time: 5784-6962 OT Time Calculation (min): 30 min  Charges: OT General Charges $OT Visit: 1 Visit OT Treatments $Self Care/Home Management : 23-37 mins  Lorinda Copland, BS, OTA/S   Dortha Neighbors 03/02/2024, 10:44 AM

## 2024-03-02 NOTE — Progress Notes (Signed)
 PT plans to see pt next. Will f/u tomorrow. German Koller BS, ACSM-CEP 03/02/2024 2:45 PM

## 2024-03-02 NOTE — Progress Notes (Addendum)
 Physical Therapy Treatment Patient Details Name: [name] MRN: [id number] DOB: [birth year] Today's Date: 03/02/2024   History of Present Illness 83 y.o. female presents to [facility] 02/08/24 for complex MVR and MAZE (via sternotomy). Post-op complicated by persistent a fib RVR and volume overload. Cardioversion x2 4/18 without resolution of afib.  4/21 returned to [facility]. 4/9-4/23 ICU stay. 4/28 pt with L pleural effusion s/p L thoracentesis. PMHx: anemia, HTN, mitral valve prolapse, moderate mitral regurgitation, tricuspid regurgitation    PT Comments  Pt in bed upon arrival and agreeable to PT session. Pt reported having increased fatigue/SOB today with increased B LE edema. She was agreeable to perform step-pivot transfer to/from the [facility] with MinA. Repetitive cues for adherence to sternal precautions throughout session. Pt had increased difficulty performing pericare and required TotalA in standing. Pt deferred further gait training or exercises due to feeling fatigued. She then required ModA to return back to supine for LE management. Discussed progressing mobility with pt agreeable to ambulate this afternoon. Pt and husband have decided that they are not comfortable discharging home at pt's current mobility level and would prefer to get more rehab. Recommending <3hrs post acute rehab to work towards independence with mobility and decreasing caregiver burden. Pt is progressing towards goals. Acute PT to follow.      If plan is discharge home, recommend the following: A little help with walking and/or transfers;A little help with bathing/dressing/bathroom;Assistance with cooking/housework;Assist for transportation;Help with stairs or ramp for entrance   Can travel by private vehicle     Yes  Equipment Recommendations  BSC/3in1       Precautions / Restrictions Precautions Precautions: Fall;Sternal Recall of Precautions/Restrictions: Impaired Precaution/Restrictions Comments: minimal cues needed to  adhere to precautions with sit/stand transfer Restrictions Other Position/Activity Restrictions: sternal     Mobility  Bed Mobility Overal bed mobility: Needs Assistance Bed Mobility: Sit to Sidelying, Rolling, Sidelying to Sit Rolling: Contact guard assist Sidelying to sit: Min assist  Sit to sidelying: Mod assist General bed mobility comments: MinA for slight trunk elevation for sidelying to sit. ModA for LE mangement for return to sidelying    Transfers Overall transfer level: Needs assistance Equipment used: None, Rolling walker (2 wheels) Transfers: Sit to/from Stand, Bed to chair/wheelchair/BSC Sit to Stand: Min assist   Step pivot transfers: Min assist     General transfer comment: MinA for boost-up with cues for adherance to sternal precautions. Performed x2 step pivots to/from [facility] with and without RW    Ambulation/Gait    General Gait Details: pt declined further gait due to fatigue and feeling SOB     Balance Overall balance assessment: Needs assistance Sitting-balance support: No upper extremity supported, Feet supported Sitting balance-Leahy Scale: Fair     Standing balance support: No upper extremity supported Standing balance-Leahy Scale: Fair Standing balance comment: able to stand statically with no AD     Communication Communication Communication: Impaired Factors Affecting Communication: Hearing impaired  Cognition Arousal: Alert Behavior During Therapy: WFL for tasks assessed/performed   PT - Cognitive impairments: Memory    PT - Cognition Comments: continues to need cues to scoot towards EOB prior to stand and to have hands on knees for sit<>stand Following commands: Intact      Cueing Cueing Techniques: Verbal cues  Exercises General Exercises - Lower Extremity Ankle Circles/Pumps: AROM, Both, Supine, 5 reps    General Comments General comments (skin integrity, edema, etc.): VSS on RA      Pertinent Vitals/Pain Pain Assessment  Pain  Assessment: No/denies pain     PT Goals (current goals can now be found in the care plan section) Acute Rehab PT Goals Patient Stated Goal: to get stronger PT Goal Formulation: With patient/family Time For Goal Achievement: 03/08/24 Potential to Achieve Goals: Good Progress towards PT goals: Progressing toward goals    Frequency    Min 2X/week       AM-PAC PT "6 Clicks" Mobility   Outcome Measure  Help needed turning from your back to your side while in a flat bed without using bedrails?: A Little Help needed moving from lying on your back to sitting on the side of a flat bed without using bedrails?: A Little Help needed moving to and from a bed to a chair (including a wheelchair)?: A Little Help needed standing up from a chair using your arms (e.g., wheelchair or bedside chair)?: A Little Help needed to walk in hospital room?: A Little Help needed climbing 3-5 steps with a railing? : A Little 6 Click Score: 18    End of Session   Activity Tolerance: Patient limited by fatigue Patient left: in bed;with call bell/phone within reach;with family/visitor present Nurse Communication: Mobility status PT Visit Diagnosis: Unsteadiness on feet (R26.81);Muscle weakness (generalized) (M62.81);Other abnormalities of gait and mobility (R26.89)     Time: 4098-1191 PT Time Calculation (min) (ACUTE ONLY): 15 min  Charges:    $Therapeutic Activity: 8-22 mins PT General Charges $$ ACUTE PT VISIT: 1 Visit           [name], PT, DPT Secure Chat Preferred  Rehab Office [phone]    [name] 03/02/2024, 4:02 PM

## 2024-03-02 NOTE — Progress Notes (Signed)
 Mobility Specialist Progress Note:    03/02/24 1130  Mobility  Activity Ambulated with assistance in room;Transferred to/from [facility]  Level of Assistance Minimal assist, patient does 75% or more  Assistive Device Front wheel walker  Distance Ambulated (ft) 5 ft  RUE Weight Bearing Per Provider Order NWB  LUE Weight Bearing Per Provider Order NWB  Activity Response Tolerated well  Mobility Referral Yes  Mobility visit 1 Mobility  Mobility Specialist Start Time (ACUTE ONLY) 1110  Mobility Specialist Stop Time (ACUTE ONLY) 1122  Mobility Specialist Time Calculation (min) (ACUTE ONLY) 12 min   Pt requested to go to the [name] and to the chair. Pt required MinA for bed mobility and STS to maintain sternal precautions. Was able to transfer to and from the [facility] w/o fault. Left in chair w/ call bell and personal belongings in reach. All needs met. Husband in room.  Inetta Manes Mobility Specialist  Please contact vis Secure Chat or  Rehab Office [phone]

## 2024-03-02 NOTE — Progress Notes (Addendum)
      [address]       [place] [address]             [phone]        23 Days Post-Op Procedure(s) (LRB): MITRAL VALVE REPAIR USING A SIMULUS SEMI-RIGID ANNULOPLASTY BAND (N/A) MAZE PROCEDURE (N/A) CLIPPING, LEFT ATRIAL APPENDAGE USING A MEDTRONIC CLIP (N/A) ECHOCARDIOGRAM, TRANSESOPHAGEAL (N/A)  Subjective: Patient sitting in chair. She said she thinks Unna boots need to be put on again and her "breathing might be getting a little harder"  Objective: Vital signs in last 24 hours: Temp:  [97.8 F (36.6 C)-98.1 F (36.7 C)] 98 F (36.7 C) (05/02 0423) Pulse Rate:  [41-85] 75 (05/02 0424) Cardiac Rhythm: Normal sinus rhythm (05/01 1900) Resp:  [11-38] 20 (05/02 0424) BP: (81-105)/(51-57) 102/56 (05/02 0423) SpO2:  [92 %-100 %] 94 % (05/02 0424) Weight:  [70 kg] 70 kg (05/02 0425)  Pre op weight 68.5 kg Current Weight  03/02/24 70 kg    Hemodynamic parameters for last 24 hours: CVP:  [1 mmHg-48 mmHg] 4 mmHg  Intake/Output from previous day: 05/01 0701 - 05/02 0700 In: 640 [P.O.:640] Out: 900 [Urine:900]   Physical Exam:  Cardiovascular: RRR Pulmonary: Diminished bibasilar breath sounds Abdomen: Soft, non tender, bowel sounds present. Extremities: Bilateral lower extremity edema. Wound: Clean and dry.  No erythema or signs of infection.  Lab Results: CBC: Recent Labs    03/01/24 0349 03/02/24 0425  WBC 9.0 8.5  HGB 8.2* 8.2*  HCT 25.1* 25.3*  PLT 190 187   BMET:  Recent Labs    03/01/24 0850 03/01/24 1630 03/02/24 0425  NA 129* 132* 133*  K 3.8  --  4.3  CL 93*  --  98  CO2 27  --  26  GLUCOSE 146*  --  107*  BUN 55*  --  37*  CREATININE 1.56*  --  1.51*  CALCIUM  8.9  --  8.5*    PT/INR:  Lab Results  Component Value Date   INR 1.6 (H) 02/08/2024   INR 1.0 02/07/2024   ABG:  INR: Will add last result for INR, ABG once components are confirmed Will add last 4 CBG results once components are  confirmed  Assessment/Plan: CV-Has had persistent fib with RVR. S/p DCCV 04/18 (failed).  SR with HR in the 70's this am.  On Amiodarone  400 mg bid and Apixaban  5 mg bid.  Pulmonary-On room air. S/p left thoracentesis 1.2 L removed 04/28. Encourage incentive spirometer. Acute systolic heart failure, non ischemic cardiomyopathy-on Spironolactone  25 mg daily, Farxiga  10 mg daily, and Unna boots off this am.  AHF following Expected post op blood loss anemia- H and H this am stable at 8.2 and 25.3 5. Hypervolemic hyponatremia, component of SIADH-sodium this am 133 (has been 123-124 mostly of late). She is on Tolvaptan  6. Non oliguric AKI-Creatinine this am is 1.51. Diuretic per AHF, nephrology. Creatinine prior to admission 1.15. 7. Deconditioned-continue PT. When ready for discharge, and per CIR note yesterday, home health recommended not CIR 8. Disposition-per notes, she is not a CIR candidate at current level. Per discussion with patient, she would prefer [facility] (and might consider another SNF). Ultimately, she would like to go home with husband.   Sylvester Salonga M ZimmermanPA-C 6:57 AM

## 2024-03-02 NOTE — Progress Notes (Signed)
 [name] is an 83 y.o. female with a past medical history HTN and PAF who presents with shortness of breath with severe mitral regurgitation status post mitral valve replacement, MAZE, and LAA clipping.  Course complicated by systolic heart failure.  We were consulted for hyponatremia.   Assessment/Plan: Hypervolemic hyponatremia: Worsening and persistent.  No obvious symptoms.  Poor appetite for months now is certainly a reset osmostat could be playing a factor as well.  Likely multifactorial but certainly volume excess. - IV Lasix  80 mg 2 times Sunday and given shortness of breath, 2+ edema now with Unna boots, slow improvement in sodium s/p 1 dose on 4/28 -> now transitioned to torsemide . -Stopped the  SGLT2 with worsening renal function. 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 Did not tolerate Ure-Na; much better after tolvaptan  and given on 5/1.  OK to discontinue NaCl tablets.  Need to restart diuretics as well; continue fluid restriction   Nonoliguric AKI: Creatinine has been elevated for most of the hospitalization but elevation has been fairly mild.  Likely some degree of ischemic injury during surgery.  Cholesterol embolic injury is always possible.  Also may just have lower GFR in the setting of her heart failure with new cardiorenal syndrome.  Stable.  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  Followed by heart failure team previously.  Continue with diuresis as above.  Continue with goal-directed medical therapy which has already been initiated.  Monitor low blood pressures closely. Started SGLT2i which has efficacy in CHF but also in hyponatremia mgmt   Atrial fibrillation with RVR: Management per primary team   Hypokalemia: On spironolactone .  Supplement as needed  Subjective: Breathing much better  after thoracentesis, intermittent cough but denies any nausea, headaches, severe pain.  Difficulty swallowing but tolerating softer items (unable to chew and swallow the toast), dizziness with ambulation.     Chemistry and CBC: Creatinine, Ser  Date/Time Value Ref Range Status  03/02/2024 04:25 AM 1.51 (H) 0.44 - 1.00 mg/dL Final  16/08/9603 54:09 AM 1.56 (H) 0.44 - 1.00 mg/dL Final  81/19/1478 29:56 AM 1.56 (H) 0.44 - 1.00 mg/dL Final  21/30/8657 84:69 AM 1.57 (H) 0.44 - 1.00 mg/dL Final  62/95/2841 32:44 PM 1.47 (H) 0.44 - 1.00 mg/dL Final  11/03/7251 66:44 AM 1.41 (H) 0.44 - 1.00 mg/dL Final  03/47/4259 56:38 PM 1.35 (H) 0.44 - 1.00 mg/dL Final  75/64/3329 51:88 AM 1.23 (H) 0.44 - 1.00 mg/dL Final  41/66/0630 16:01 PM 1.26 (H) 0.44 - 1.00 mg/dL Final  09/32/3557 32:20 PM 1.37 (H) 0.44 - 1.00 mg/dL Final  25/42/7062 37:62 AM 1.35 (H) 0.44 - 1.00 mg/dL Final  83/15/1761 60:73 AM 1.25 (H) 0.44 - 1.00 mg/dL Final  71/04/2693 85:46 AM 1.40 (H) 0.44 - 1.00 mg/dL Final  27/12/5007 38:18 AM 1.33 (H) 0.44 - 1.00 mg/dL Final  29/93/7169 67:89 AM 1.28 (H) 0.44 - 1.00 mg/dL Final  38/08/1750 02:58 PM 1.37 (H) 0.44 - 1.00 mg/dL Final  52/77/8242 35:36 AM 1.44 (H) 0.44 - 1.00 mg/dL Final  14/43/1540 08:67 PM 1.48 (H) 0.44 - 1.00 mg/dL Final  61/95/0932 67:12 AM 1.47 (H) 0.44 - 1.00 mg/dL Final  45/80/9983 38:25 PM 1.41 (H) 0.44 - 1.00 mg/dL Final  05/39/7673 41:93 AM 1.39 (H) 0.44 - 1.00 mg/dL Final  79/12/4095 35:32 PM 1.31 (H) 0.44 - 1.00  mg/dL Final  40/98/1191 47:82 AM 1.41 (H) 0.44 - 1.00 mg/dL Final  95/62/1308 65:78 PM 1.49 (H) 0.44 - 1.00 mg/dL Final  46/96/2952 84:13 AM 1.46 (H) 0.44 - 1.00 mg/dL Final  24/40/1027 25:36 PM 1.54 (H) 0.44 - 1.00 mg/dL Final  64/40/3474 25:95 AM 1.48 (H) 0.44 - 1.00 mg/dL Final  63/87/5643 32:95 AM 1.34 (H) 0.44 - 1.00 mg/dL Final  18/84/1660 63:01 PM 1.28 (H) 0.44 - 1.00 mg/dL Final  60/08/9322 55:73 AM 1.29 (H) 0.44 - 1.00 mg/dL Final  22/12/5425  06:23 AM 1.15 (H) 0.44 - 1.00 mg/dL Final  76/28/3151 76:16 AM 1.13 (H) 0.44 - 1.00 mg/dL Final  07/37/1062 69:48 AM 1.22 (H) 0.44 - 1.00 mg/dL Final  54/62/7035 00:93 AM 1.18 (H) 0.44 - 1.00 mg/dL Final  81/82/9937 16:96 AM 1.17 (H) 0.44 - 1.00 mg/dL Final  78/93/8101 75:10 PM 1.27 (H) 0.44 - 1.00 mg/dL Final  25/85/2778 24:23 AM 0.91 0.44 - 1.00 mg/dL Final  53/61/4431 54:00 PM 0.86 0.44 - 1.00 mg/dL Final  86/76/1950 93:26 AM 0.90 0.44 - 1.00 mg/dL Final  71/24/5809 98:33 AM 0.90 0.44 - 1.00 mg/dL Final  82/50/5397 67:34 AM 0.80 0.44 - 1.00 mg/dL Final  19/37/9024 09:73 AM 0.90 0.44 - 1.00 mg/dL Final  53/29/9242 68:34 AM 1.00 0.44 - 1.00 mg/dL Final  19/62/2297 98:92 AM 1.15 (H) 0.44 - 1.00 mg/dL Final  11/94/1740 81:44 AM 0.81 0.44 - 1.00 mg/dL Final  81/85/6314 97:02 PM 0.76 0.44 - 1.00 mg/dL Final  63/78/5885 02:77 PM 0.95 0.57 - 1.00 mg/dL Final  41/28/7867 67:20 AM 1.13 (H) 0.57 - 1.00 mg/dL Final  94/70/9628 36:62 AM 0.88 0.44 - 1.00 mg/dL Final  94/76/5465 03:54 AM 0.88 0.44 - 1.00 mg/dL Final  65/68/1275 17:00 PM 1.12 0.40 - 1.20 mg/dL Final  17/49/4496 75:91 AM 0.73 0.50 - 1.10 mg/dL Final   Recent Labs  Lab 02/27/24 1711 02/28/24 0447 02/28/24 1500 02/29/24 0447 03/01/24 0349 03/01/24 0850 03/01/24 1630 03/02/24 0425  NA 121* 124* 123* 124* 126* 129* 132* 133*  K 3.7 3.4* 3.8 4.2 4.2 3.8  --  4.3  CL 84* 87* 86* 88* 92* 93*  --  98  CO2 26 27 27 27 27 27   --  26  GLUCOSE 123* 102* 114* 97 104* 146*  --  107*  BUN 46* 60* 68* 68* 59* 55*  --  37*  CREATININE 1.35* 1.41* 1.47* 1.57* 1.56* 1.56*  --  1.51*  CALCIUM  8.2* 7.9* 8.4* 7.9* 8.3* 8.9  --  8.5*   Recent Labs  Lab 02/29/24 0447 03/01/24 0349 03/02/24 0425  WBC 9.4 9.0 8.5  HGB 8.6* 8.2* 8.2*  HCT 25.9* 25.1* 25.3*  MCV 88.7 89.6 91.3  PLT 214 190 187   Liver Function Tests: Recent Labs  Lab 03/01/24 0850  AST 31  ALT 34  ALKPHOS 54  BILITOT 1.4*  PROT 5.0*  ALBUMIN  2.6*   No results  for input(s): "LIPASE", "AMYLASE" in the last 168 hours. No results for input(s): "AMMONIA" in the last 168 hours. Cardiac Enzymes: No results for input(s): "CKTOTAL", "CKMB", "CKMBINDEX", "TROPONINI" in the last 168 hours. Iron  Studies: No results for input(s): "IRON ", "TIBC", "TRANSFERRIN", "FERRITIN" in the last 72 hours. PT/INR: @LABRCNTIP (inr:5)  Xrays/Other Studies: ) Results for orders placed or performed during the hospital encounter of 02/08/24 (from the past 48 hours)  Osmolality, urine     Status: None   Collection Time: 02/29/24 10:15 AM  Result Value Ref Range  Osmolality, Ur 351 300 - 900 mOsm/kg    Comment: REPEATED TO VERIFY Performed at [facility], [address] [place], [place] [address]   Na and K (sodium & potassium), rand urine     Status: None   Collection Time: 02/29/24 10:15 AM  Result Value Ref Range   Sodium, Ur <10 mmol/L   Potassium Urine 20 mmol/L    Comment: Performed at [facility], [address], [place], [place] [address]  Creatinine, urine, random     Status: None   Collection Time: 02/29/24 10:15 AM  Result Value Ref Range   Creatinine, Urine 30 mg/dL    Comment: Performed at [facility], [address] [place], [place] [address]  Basic metabolic panel     Status: Abnormal   Collection Time: 03/01/24  3:49 AM  Result Value Ref Range   Sodium 126 (L) 135 - 145 mmol/L   Potassium 4.2 3.5 - 5.1 mmol/L   Chloride 92 (L) 98 - 111 mmol/L   CO2 27 22 - 32 mmol/L   Glucose, Bld 104 (H) 70 - 99 mg/dL    Comment: Glucose reference range applies only to samples taken after fasting for at least 8 hours.   BUN 59 (H) 8 - 23 mg/dL   Creatinine, Ser 9.62 (H) 0.44 - 1.00 mg/dL   Calcium  8.3 (L) 8.9 - 10.3 mg/dL   GFR, Estimated 33 (L) >60 mL/min    Comment: (NOTE) Calculated using the CKD-EPI Creatinine Equation (2021)    Anion gap 7 5 - 15    Comment: Performed at [facility], [address], [place], [place]  [address]  Magnesium      Status: None   Collection Time: 03/01/24  3:49 AM  Result Value Ref Range   Magnesium  2.2 1.7 - 2.4 mg/dL    Comment: Performed at [facility], [address], [place], [place] [address]  CBC     Status: Abnormal   Collection Time: 03/01/24  3:49 AM  Result Value Ref Range   WBC 9.0 4.0 - 10.5 K/uL   RBC 2.80 (L) 3.87 - 5.11 MIL/uL   Hemoglobin 8.2 (L) 12.0 - 15.0 g/dL   HCT 01.0 (L) 27.2 - 53.6 %   MCV 89.6 80.0 - 100.0 fL   MCH 29.3 26.0 - 34.0 pg   MCHC 32.7 30.0 - 36.0 g/dL   RDW 64.4 (H) 03.4 - 74.2 %   Platelets 190 150 - 400 K/uL   nRBC 0.0 0.0 - 0.2 %    Comment: Performed at [facility], [address], [place], [place] [address]  BASELINE basic metabolic panel - IF NOT already drawn     Status: Abnormal   Collection Time: 03/01/24  8:50 AM  Result Value Ref Range   Sodium 129 (L) 135 - 145 mmol/L   Potassium 3.8 3.5 - 5.1 mmol/L   Chloride 93 (L) 98 - 111 mmol/L   CO2 27 22 - 32 mmol/L   Glucose, Bld 146 (H) 70 - 99 mg/dL    Comment: Glucose reference range applies only to samples taken after fasting for at least 8 hours.   BUN 55 (H) 8 - 23 mg/dL   Creatinine, Ser 8.75 (H) 0.44 - 1.00 mg/dL   Calcium  8.9 8.9 - 10.3 mg/dL   GFR, Estimated 33 (L) >60 mL/min    Comment: (NOTE) Calculated using the CKD-EPI Creatinine Equation (2021)  Anion gap 9 5 - 15    Comment: Performed at [facility], [address], [place], [place] [address]  Hepatic function panel (Baseline)     Status: Abnormal   Collection Time: 03/01/24  8:50 AM  Result Value Ref Range   Total Protein 5.0 (L) 6.5 - 8.1 g/dL   Albumin  2.6 (L) 3.5 - 5.0 g/dL   AST 31 15 - 41 U/L   ALT 34 0 - 44 U/L   Alkaline Phosphatase 54 38 - 126 U/L   Total Bilirubin 1.4 (H) 0.0 - 1.2 mg/dL   Bilirubin, Direct 0.4 (H) 0.0 - 0.2 mg/dL   Indirect Bilirubin 1.0 (H) 0.3 - 0.9 mg/dL    Comment: Performed at [facility], [address] [place], [place] [address]   Sodium     Status: Abnormal   Collection Time: 03/01/24  4:30 PM  Result Value Ref Range   Sodium 132 (L) 135 - 145 mmol/L    Comment: Performed at [facility], [address], [place], [place] [address]  Basic metabolic panel     Status: Abnormal   Collection Time: 03/02/24  4:25 AM  Result Value Ref Range   Sodium 133 (L) 135 - 145 mmol/L   Potassium 4.3 3.5 - 5.1 mmol/L   Chloride 98 98 - 111 mmol/L   CO2 26 22 - 32 mmol/L   Glucose, Bld 107 (H) 70 - 99 mg/dL    Comment: Glucose reference range applies only to samples taken after fasting for at least 8 hours.   BUN 37 (H) 8 - 23 mg/dL   Creatinine, Ser 9.14 (H) 0.44 - 1.00 mg/dL   Calcium  8.5 (L) 8.9 - 10.3 mg/dL   GFR, Estimated 35 (L) >60 mL/min    Comment: (NOTE) Calculated using the CKD-EPI Creatinine Equation (2021)    Anion gap 9 5 - 15    Comment: Performed at [facility], [address] [place], [place] [address]  Magnesium      Status: None   Collection Time: 03/02/24  4:25 AM  Result Value Ref Range   Magnesium  2.3 1.7 - 2.4 mg/dL    Comment: Performed at [facility], [address], [place], [place] [address]  CBC     Status: Abnormal   Collection Time: 03/02/24  4:25 AM  Result Value Ref Range   WBC 8.5 4.0 - 10.5 K/uL   RBC 2.77 (L) 3.87 - 5.11 MIL/uL   Hemoglobin 8.2 (L) 12.0 - 15.0 g/dL   HCT 86.5 (L) 78.4 - 69.6 %   MCV 91.3 80.0 - 100.0 fL   MCH 29.6 26.0 - 34.0 pg   MCHC 32.4 30.0 - 36.0 g/dL   RDW 29.5 (H) 28.4 - 13.2 %   Platelets 187 150 - 400 K/uL   nRBC 0.0 0.0 - 0.2 %    Comment: Performed at [facility] [place] [facility], [address] [place], [place] [address]   No results found.   PMH:   Past Medical History:  Diagnosis Date   Acid reflux    Anemia    Arthritis    Atrial fibrillation (HCC)    Cataract    Diverticulosis of colon    Endometrial cancer (HCC)    1A grade endometrial ca   Essential hypertension 01/24/2009   Qualifier: Diagnosis of  By: Nelson-Smith CMA  (AAMA), Dottie     Hx of radiation therapy 04/24/08-05/29/08& 8/12/,8/19,&06/27/08   external  beam  through 7/09 ,then intracavity in 06/2008   Hypertension    Mitral valve prolapse    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   Tricuspid regurgitation     PSH:   Past Surgical History:  Procedure Laterality Date   APPENDECTOMY     1967   arthroscopic knee right surgery     CLIPPING OF ATRIAL APPENDAGE N/A 02/08/2024   Procedure: CLIPPING, LEFT ATRIAL APPENDAGE USING A MEDTRONIC CLIP;  Surgeon: Melene Sportsman, MD;  Location: [facility] OR;  Service: Open Heart Surgery;  Laterality: N/A;   ECTOPIC PREGNANCY SURGERY  1967   IR THORACENTESIS ASP PLEURAL SPACE W/IMG GUIDE  02/27/2024   iron  infusion  12/2022   LAPAROSCOPIC HYSTERECTOMY     total   MAZE N/A 02/08/2024   Procedure: MAZE PROCEDURE;  Surgeon: Melene Sportsman, MD;  Location: [facility] OR;  Service: Open Heart Surgery;  Laterality: N/A;   MITRAL VALVE REPAIR N/A 02/08/2024   Procedure: MITRAL VALVE REPAIR USING A SIMULUS SEMI-RIGID ANNULOPLASTY BAND;  Surgeon: Melene Sportsman, MD;  Location: [facility] OR;  Service: Open Heart Surgery;  Laterality: N/A;  possible replacement   ORTHOPEDIC SURGERY     Orthoscopic Knee Surgery   RIGHT/LEFT HEART CATH AND CORONARY ANGIOGRAPHY N/A 12/30/2023   Procedure: RIGHT/LEFT HEART CATH AND CORONARY ANGIOGRAPHY;  Surgeon: [name], MD;  Location: [facility] INVASIVE CV LAB;  Service: Cardiovascular;  Laterality: N/A;   TEAR DUCT PROBING     TEE WITHOUT CARDIOVERSION N/A 02/08/2024   Procedure: ECHOCARDIOGRAM, TRANSESOPHAGEAL;  Surgeon: Melene Sportsman, MD;  Location: [facility] OR;  Service: Open Heart Surgery;  Laterality: N/A;   TOOTH EXTRACTION     bone graft   TRANSESOPHAGEAL ECHOCARDIOGRAM (CATH LAB) N/A 12/30/2023   Procedure: TRANSESOPHAGEAL ECHOCARDIOGRAM;  Surgeon: Euell Herrlich, MD;  Location: [facility] INVASIVE CV LAB;  Service: Cardiovascular;  Laterality: N/A;    Allergies:  Allergies  Allergen Reactions   Crestor  [Rosuvastatin] Other (See Comments)    Leg cramps   Glycopyrrolate      Other reaction(s): extreme dry mouth   Codeine Other (See Comments)    Hyper    Medications:   Prior to Admission medications   Medication Sig Start Date End Date Taking? Authorizing Provider  amiodarone  (PACERONE ) 200 MG tablet Take 1 tablet (200 mg total) by mouth daily. 01/13/24  Yes Meng, Hao, PA  apixaban  (ELIQUIS ) 5 MG TABS tablet Take 1 tablet (5 mg total) by mouth 2 (two) times daily. 01/26/24  Yes [name], MD  b complex vitamins tablet Take 1 tablet by mouth daily.     Yes [provider]  Bempedoic Acid -Ezetimibe  (NEXLIZET) 180-10 MG TABS Take 1 tablet by mouth daily.   Yes [provider]  CALCIUM  PO Take 650 mg by mouth daily.   Yes [provider]  Cholecalciferol (VITAMIN D3) 125 MCG (5000 UT) CAPS Take 5,000 Units by mouth daily.   Yes [provider]  denosumab  (PROLIA ) 60 MG/ML SOSY injection Inject 60 mg into the skin every 6 (six) months.   Yes [provider]  furosemide  (LASIX ) 20 MG tablet Take 1 tablet (20 mg total) by mouth daily. 01/12/24 01/11/25 Yes Melene Sportsman, MD  Garlic 1000 MG CAPS Take 1,000 mg by mouth daily.   Yes [provider]  Joneen Nelson Omega-3 300 MG CAPS Take 300 mg by mouth daily.   Yes [provider]  loperamide  (IMODIUM ) 2 MG capsule Take 1 capsule (2 mg total) by mouth 4 (four)  times daily as needed for diarrhea or loose stools. 01/10/21  Yes Trish Furl, MD  metoprolol  succinate (TOPROL -XL) 25 MG 24 hr tablet Take 25 mg by mouth 2 (two) times daily.  Take additional tablet for palpitations   Yes [provider]  Multiple Vitamins-Minerals (MULTIVITAMINS THER. W/MINERALS) TABS Take 1 tablet by mouth daily.     Yes [provider]  pantoprazole  (PROTONIX ) 40 MG tablet Take 40 mg by mouth daily.   Yes [provider]  potassium chloride  SA (KLOR-CON  M) 20 MEQ tablet Take 0.5 tablets (10 mEq  total) by mouth daily. 01/12/24  Yes Melene Sportsman, MD  Zinc 50 MG TABS Take 50 mg by mouth daily.   Yes [provider]  apixaban  (ELIQUIS ) 5 MG TABS tablet Take 1 tablet (5 mg total) by mouth 2 (two) times daily. 01/31/24   [name], MD  atorvastatin  (LIPITOR) 10 MG tablet Take 1 tablet (10 mg total) by mouth daily. Patient not taking: Reported on 01/25/2024 12/20/23 03/19/24  [name], MD  ezetimibe  (ZETIA ) 10 MG tablet Take 1 tablet (10 mg total) by mouth daily. Patient not taking: Reported on 01/25/2024 12/20/23 03/19/24  [name], MD    Discontinued Meds:   Medications Discontinued During This Encounter  Medication Reason   EPINEPHrine  (ADRENALIN ) 5 mg in NS 250 mL (0.02 mg/mL) premix infusion    milrinone  (PRIMACOR ) 20 MG/100 ML (0.2 mg/mL) infusion    nitroGLYCERIN  50 mg in dextrose  5 % 250 mL (0.2 mg/mL) infusion    norepinephrine  (LEVOPHED ) 4mg  in (0.016 mg/mL) premix infusion    potassium chloride  injection 80 mEq    heparin  30,000 units/NS 1000 mL solution for CELLSAVER    heparin  sodium (porcine) 2,500 Units, papaverine 30 mg in electrolyte-A (PLASMALYTE-A PH 7.4) 500 mL irrigation    tranexamic acid  (CYKLOKAPRON ) pump prime solution 137 mg    vancomycin  (VANCOCIN ) 1,000 mg in sodium chloride  0.9 % 1,000 mL irrigation    [facility] Blood Cardioplegia vial (lidocaine /magnesium /mannitol  0.26g-4g-6.4g)    [facility] Blood Cardioplegia vial (lidocaine /magnesium /mannitol  0.26g-4g-6.4g)    [facility]    metoprolol  tartrate (LOPRESSOR ) tablet 12.5 mg    chlorhexidine  (HIBICLENS ) 4 % liquid 2 Application    0.9 % irrigation (POUR BTL)    vancomycin  (VANCOCIN ) 1,000 mg in sodium chloride  0.9 % 1,000 mL irrigation    Surgifoam 1 Gm with 0.9% sodium chloride  (4 ml) topical solution Patient Discharge   Bempedoic Acid -Ezetimibe  180-10 MG TABS 1 tablet Not available   sodium chloride  flush (NS) 0.9 % injection 3-10 mL    sodium  chloride flush (NS) 0.9 % injection 3-10 mL    sodium chloride  flush (NS) 0.9 % injection 3-10 mL    sodium chloride  flush (NS) 0.9 % injection 3-10 mL    sodium chloride  flush (NS) 0.9 % injection 3-10 mL    sodium chloride  flush (NS) 0.9 % injection 3-10 mL    acetaminophen  (TYLENOL ) 160 MG/5ML solution 1,000 mg Change in therapy   aspirin  chewable tablet 324 mg Change in therapy   metoprolol  tartrate (LOPRESSOR ) 25 mg/10 mL oral suspension 12.5 mg Change in therapy   0.45 % sodium chloride  infusion Change in therapy   acetaminophen  (TYLENOL ) 160 MG/5ML solution 650 mg    Oral care mouth rinse    Oral care mouth rinse    midazolam  (VERSED ) injection 2 mg    dexmedetomidine  (PRECEDEX ) 400 MCG/100ML (4 mcg/mL) infusion    nitroGLYCERIN  50  mg in dextrose  5 % 250 mL (0.2 mg/mL) infusion    amiodarone  (PACERONE ) tablet 400 mg    EPINEPHrine  (ADRENALIN ) 5 mg in NS 250 mL (0.02 mg/mL) premix infusion    potassium chloride  SA (KLOR-CON  M) CR tablet 20 mEq    amiodarone  (NEXTERONE ) 1.5 mg/mL IV bolus only 150 mg Entry Error   insulin  regular, human (MYXREDLIN ) 100 units/ 100 mL infusion    enoxaparin  (LOVENOX ) injection 30 mg    phenylephrine  (NEO-SYNEPHRINE) 20mg /NS 250mL premix infusion    metoprolol  tartrate (LOPRESSOR ) tablet 12.5 mg    aspirin  EC tablet 325 mg    vasopressin  (PITRESSIN) 20 Units in 100 mL (0.2 unit/mL) infusion-*FOR SHOCK*    amiodarone  (NEXTERONE  PREMIX) 360-4.14 MG/200ML-% (1.8 mg/mL) IV infusion    norepinephrine  (LEVOPHED ) 16 mg in (0.064 mg/mL) premix infusion    heparin  ADULT infusion 100 units/mL (25000 units/250mL)    amiodarone  (NEXTERONE ) 1.5 mg/mL IV bolus only 150 mg    apixaban  (ELIQUIS ) tablet 2.5 mg    amiodarone  (NEXTERONE ) 1.8 mg/mL load via infusion 150 mg    scopolamine  (TRANSDERM-SCOP) 1 MG/3DAYS 1.5 mg    amiodarone  (PACERONE ) tablet 400 mg Duplicate   furosemide  (LASIX ) injection 40 mg    amiodarone  (PACERONE ) tablet 400 mg    amiodarone   (NEXTERONE ) 1.5 mg/mL IV bolus only 150 mg    potassium chloride  10 mEq in 50 mL *CENTRAL LINE* IVPB    docusate sodium  (COLACE) capsule 200 mg    bisacodyl  (DULCOLAX) EC tablet 10 mg    bisacodyl  (DULCOLAX) suppository 10 mg    amiodarone  (NEXTERONE ) 1.5 mg/mL IV bolus only 150 mg    senna (SENOKOT) tablet 8.6 mg    potassium chloride  SA (KLOR-CON  M) CR tablet 40 mEq    ondansetron  (ZOFRAN ) injection 4 mg    digoxin  (LANOXIN ) 0.1 MG/ML injection 0.5 mg Change in therapy   digoxin  (LANOXIN ) 0.1 MG/ML injection 0.25 mg Change in therapy   bethanechol  (URECHOLINE ) tablet 5 mg Discontinued by provider   potassium chloride  (KLOR-CON ) packet 40 mEq    potassium chloride  10 mEq in 50 mL *CENTRAL LINE* IVPB    loperamide  HCl (IMODIUM ) 1 MG/7.5ML suspension 2 mg    potassium chloride  SA (KLOR-CON  M) CR tablet 40 mEq    potassium chloride  (KLOR-CON ) packet 40 mEq    potassium chloride  (KLOR-CON ) packet 40 mEq Duplicate   aspirin  chewable tablet 81 mg    traMADol  (ULTRAM ) tablet 50 mg    sodium chloride  flush (NS) 0.9 % injection 3 mL    sodium chloride  flush (NS) 0.9 % injection 10-40 mL    fentaNYL  (SUBLIMAZE ) injection 100 mcg    sodium chloride  flush (NS) 0.9 % injection 3-10 mL    sodium chloride  flush (NS) 0.9 % injection 10-40 mL    potassium chloride  SA (KLOR-CON  M) CR tablet 40 mEq    potassium chloride  (KLOR-CON ) packet 20 mEq    potassium chloride  10 mEq in 50 mL *CENTRAL LINE* IVPB    metoprolol  tartrate (LOPRESSOR ) injection 2.5-5 mg    digoxin  (LANOXIN ) tablet 0.125 mg    digoxin  (LANOXIN ) tablet 0.0625 mg    furosemide  (LASIX ) 200 mg in dextrose  5 % 100 mL (2 mg/mL) infusion    potassium chloride  SA (KLOR-CON  M) CR tablet 40 mEq    potassium chloride  SA (KLOR-CON  M) CR tablet 40 mEq    aspirin  EC tablet 81 mg    milrinone  (PRIMACOR ) 20 MG/100 ML (0.2 mg/mL) infusion  traMADol  (ULTRAM ) tablet 50 mg    amiodarone  (NEXTERONE  PREMIX) 360-4.14 MG/200ML-% (1.8 mg/mL) IV infusion     morphine  (PF) 2 MG/ML injection 1-4 mg    potassium chloride  SA (KLOR-CON  M) CR tablet 20 mEq    potassium chloride  (KLOR-CON  M) CR tablet 30 mEq    spironolactone  (ALDACTONE ) tablet 12.5 mg    potassium chloride  (KLOR-CON  M) CR tablet 30 mEq    amiodarone  (PACERONE ) tablet 400 mg    amiodarone  (PACERONE ) tablet 200 mg    dextrose  50 % solution 0-50 mL    insulin  aspart (novoLOG ) injection 2-6 Units    feeding supplement (BOOST / RESOURCE BREEZE) liquid 1 Container    sodium chloride  flush (NS) 0.9 % injection 3 mL    sodium chloride  flush (NS) 0.9 % injection 3 mL    0.9 %  sodium chloride  infusion    amiodarone  (PACERONE ) tablet 200 mg    digoxin  (LANOXIN ) tablet 0.0625 mg    torsemide  (DEMADEX ) tablet 40 mg    potassium chloride  SA (KLOR-CON  M) CR tablet 40 mEq    torsemide  (DEMADEX ) tablet 40 mg    urea  (URE-NA) oral packet 15 g    furosemide  (LASIX ) injection 80 mg    potassium chloride  SA (KLOR-CON  M) CR tablet 20 mEq    potassium chloride  SA (KLOR-CON  M) CR tablet 20 mEq    urea  (URE-NA) oral packet 30 g    losartan  (COZAAR ) tablet 12.5 mg    potassium chloride  SA (KLOR-CON  M) CR tablet 20 mEq    urea  (URE-NA) oral packet 15 g    dapagliflozin  propanediol (FARXIGA ) tablet 10 mg    potassium chloride  (KLOR-CON  M) CR tablet 30 mEq    torsemide  (DEMADEX ) tablet 40 mg    potassium chloride  SA (KLOR-CON  M) CR tablet 20 mEq    sodium chloride  tablet 1 g     Social History:  reports that she has never smoked. She has never used smokeless tobacco. She reports current alcohol use. She reports that she does not use drugs.  Family History:   Family History  Problem Relation Age of Onset   Diabetes type II Mother    Breast cancer Mother    Leukemia Father    Diabetes type II Sister    Breast cancer Sister    Cancer Brother 71       throat 10/2020   Pancreatic cancer Maternal Aunt        x2   Diabetes Maternal Grandmother    Diabetes Maternal Grandfather    Colon cancer  Neg Hx    Stomach cancer Neg Hx    Esophageal cancer Neg Hx     Blood pressure (!) 93/52, pulse 74, temperature 98 F (36.7 C), temperature source Oral, resp. rate (!) 21, height 5\' 4"  (1.626 m), weight 70 kg, SpO2 99%. Physical Exam: General: NAD HEENT: anicteric sclera CV: Normal rate, no rub, 2+ pitting edema in the bilateral lower extremities Lungs: Mild increased work of breathing, trace rales Abd: SNDNT +BS Skin: no visible lesions or rashes Musculoskeletal: no obvious deformities Neuro: normal speech, no gross focal deficits  Ext: 2+ edema R>L     Patrick Boor, MD 03/02/2024, 9:36 AM

## 2024-03-02 NOTE — Progress Notes (Signed)
 Advanced Heart Failure Rounding Note  Cardiologist: [name], MD   Chief Complaint: Heart Failure Subjective:    - S/p MVR 02/08/24. Post-op complicated by persistent a fib RVR and volume overload. - Failed DCCV on 4/18 - Back in SR 4/21. Diuretics stopped d/t hypochloremia, hypokalemia and hyponatremia. Given hypertonic saline. - Milrinone  stopped 4/22 - Left thoracentesis 4/28  Feels a little more short of breath today and legs are more edematous.  Has already worked with OT today, able to sit at sink and bathe herself.    Objective:    Weight Range: 70 kg Body mass index is 26.49 kg/m.   Vital Signs:   Temp:  [97.8 F (36.6 C)-98.1 F (36.7 C)] 98 F (36.7 C) (05/02 0700) Pulse Rate:  [41-85] 74 (05/02 0700) Resp:  [11-38] 21 (05/02 0700) BP: (81-105)/(51-57) 93/52 (05/02 0700) SpO2:  [92 %-100 %] 99 % (05/02 0700) Weight:  [70 kg] 70 kg (05/02 0425) Last BM Date : 03/01/24  Weight change: Filed Weights   02/29/24 0240 03/01/24 0625 03/02/24 0425  Weight: 71.4 kg 70.9 kg 70 kg   Intake/Output:  Intake/Output Summary (Last 24 hours) at 03/02/2024 0853 Last data filed at 03/02/2024 0600 Gross per 24 hour  Intake 440 ml  Output 750 ml  Net -310 ml   Physical Exam   CVP 8 General:  Sitting up in chair. No distress. Neck: JVP 8 cm Cor: Regular rate & rhythm. No rubs, gallops or murmurs. Lungs: breathing nonlabored Abdomen: soft, nontender, nondistended. Extremities: 1-2 + edema Neuro: alert & orientedx3. Affect pleasant   Telemetry  SR 70s  Labs    CBC Recent Labs    03/01/24 0349 03/02/24 0425  WBC 9.0 8.5  HGB 8.2* 8.2*  HCT 25.1* 25.3*  MCV 89.6 91.3  PLT 190 187    Basic Metabolic Panel Recent Labs    16/10/96 0349 03/01/24 0850 03/01/24 1630 03/02/24 0425  NA 126* 129* 132* 133*  K 4.2 3.8  --  4.3  CL 92* 93*  --  98  CO2 27 27  --  26  GLUCOSE 104* 146*  --  107*  BUN 59* 55*  --  37*  CREATININE 1.56* 1.56*  --  1.51*   CALCIUM  8.3* 8.9  --  8.5*  MG 2.2  --   --  2.3   Imaging   No results found.     Medications:    Scheduled Medications:  amiodarone   400 mg Oral BID   apixaban   5 mg Oral BID   atorvastatin   10 mg Oral Daily   Chlorhexidine  Gluconate Cloth  6 each Topical Daily   [facility] Patient [facility] Family Education   Does not apply Once   dapagliflozin  propanediol  10 mg Oral Daily   ezetimibe   10 mg Oral Daily   feeding supplement  237 mL Oral BID BM   magnesium  gluconate  500 mg Oral Daily   melatonin  3 mg Oral QHS   multivitamin with minerals  1 tablet Oral Daily   pantoprazole   40 mg Oral Daily   sodium chloride  flush  3-10 mL Intravenous Q12H   sodium chloride   1 g Oral TID WC   spironolactone   25 mg Oral Daily   Infusions:  promethazine  (PHENERGAN ) injection (IM or IVPB) Stopped (02/20/24 0000)   PRN Medications: loperamide , ondansetron  (ZOFRAN ) IV, mouth rinse, oxyCODONE , phenol, polyethylene glycol, promethazine  (PHENERGAN ) injection (IM or IVPB), sodium chloride  flush, traMADol ,  witch hazel-glycerin   Patient Profile   [name] is a 83 y.o. female with HTN and PAF. Admitted with SOB> found to have severe MR. Now s/p MVR, MAZE and LAA clipping. AHF team consulted for acute systolic heart failure.   Assessment/Plan  Acute systolic heart failure, NiCM - Intra-op TEE 02/08/24 with EF 55-60% and severe MR - [facility] 2/25 with no CAD. RA 8, PA 41/17 (14), Fick CO/CI 7.76/4.41, PAPi 3, LVEDP 11 - Echo 4/25 with EF 35-40%, LV with GHK, RV normal, LA mod dilated, RA severely dilated, trivial MR, mod TR - Suspect drop in EF 2/2 persistent AF RVR.  - NYHA II-IIIa, IV on admission -  Volume appears elevated today. Give 40 mg po Torsemide  today, reassess need for diuretics tomorrow. - GDMT limited by soft SBP.   - Continue Farxiga  10 mg daily - Now off digoxin  - Continue spiro 25 mg daily   Atrial fibrillation with RVR - S/p Maze and LAA clipping 4/25 - Failed  DCCV X 2 04/17 - In and out of Afib this admit, SR today - Continue amiodarone  400 mg BID, reduce to 200 mg daily prior to discharge - Continue eliquis  5 mg BID.   Severe MR s/p MVR - Intra-op TEE with severe MR 02/08/24 - Trivial MR on echo 02/14/24  4. AKI - baseline Cr normal - Cr slowly trending up, 1.2>1.35>1.41>1.5 last few days - Nephrology following. Suspect ischemic injury during surgery and cardiorenal syndrome  5. Hypervolemic hyponatremia - Felt to be multifactorial, volume overload, component of SIADH -  Nephrology following. Now off Ure-Na and NaCl. Received 1 dose of Tolvaptan .  - Na improving, up to 133 today  6. Left pleural effusion - S/p left thoracentesis on 04/28 - CXR today in view of increased dyspnea  7. Debility - Continue aggressive PT/OT. Considering SNF at [facility] PT at discharge    Length of Stay: [address], Angelena Barber, PA-C  03/02/2024, 8:53 AM

## 2024-03-03 ENCOUNTER — Inpatient Hospital Stay (HOSPITAL_COMMUNITY)

## 2024-03-03 LAB — BASIC METABOLIC PANEL WITH GFR
Anion gap: 10 (ref 5–15)
BUN: 29 mg/dL — ABNORMAL HIGH (ref 8–23)
CO2: 25 mmol/L (ref 22–32)
Calcium: 8.2 mg/dL — ABNORMAL LOW (ref 8.9–10.3)
Chloride: 97 mmol/L — ABNORMAL LOW (ref 98–111)
Creatinine, Ser: 1.56 mg/dL — ABNORMAL HIGH (ref 0.44–1.00)
GFR, Estimated: 33 mL/min — ABNORMAL LOW (ref >60)
Glucose, Bld: 100 mg/dL — ABNORMAL HIGH (ref 70–99)
Potassium: 4.1 mmol/L (ref 3.5–5.1)
Sodium: 132 mmol/L — ABNORMAL LOW (ref 135–145)

## 2024-03-03 MED ORDER — TORSEMIDE 20 MG PO TABS
40.0000 mg | ORAL_TABLET | Freq: Every day | ORAL | Status: DC
  Administered 2024-03-03: 40 mg via ORAL
  Filled 2024-03-03: qty 2

## 2024-03-03 NOTE — Plan of Care (Signed)
  Problem: Education: Goal: Knowledge of General Education information will improve Description: Including pain rating scale, medication(s)/side effects and non-pharmacologic comfort measures Outcome: Progressing   Problem: Health Behavior/Discharge Planning: Goal: Ability to manage health-related needs will improve Outcome: Progressing   Problem: Clinical Measurements: Goal: Ability to maintain clinical measurements within normal limits will improve Outcome: Progressing Goal: Will remain free from infection Outcome: Progressing Goal: Diagnostic test results will improve Outcome: Progressing Goal: Respiratory complications will improve Outcome: Progressing Goal: Cardiovascular complication will be avoided Outcome: Progressing   Problem: Activity: Goal: Risk for activity intolerance will decrease Outcome: Progressing   Problem: Nutrition: Goal: Adequate nutrition will be maintained Outcome: Progressing   Problem: Coping: Goal: Level of anxiety will decrease Outcome: Progressing   Problem: Elimination: Goal: Will not experience complications related to bowel motility Outcome: Progressing Goal: Will not experience complications related to urinary retention Outcome: Progressing   Problem: Pain Managment: Goal: General experience of comfort will improve and/or be controlled Outcome: Progressing   Problem: Safety: Goal: Ability to remain free from injury will improve Outcome: Progressing   Problem: Skin Integrity: Goal: Risk for impaired skin integrity will decrease Outcome: Progressing   Problem: Education: Goal: Will demonstrate proper wound care and an understanding of methods to prevent future damage Outcome: Progressing Goal: Knowledge of disease or condition will improve Outcome: Progressing Goal: Knowledge of the prescribed therapeutic regimen will improve Outcome: Progressing   Problem: Activity: Goal: Risk for activity intolerance will decrease Outcome:  Progressing   Problem: Cardiac: Goal: Will achieve and/or maintain hemodynamic stability Outcome: Progressing   Problem: Clinical Measurements: Goal: Postoperative complications will be avoided or minimized Outcome: Progressing   Problem: Respiratory: Goal: Respiratory status will improve Outcome: Progressing   Problem: Skin Integrity: Goal: Wound healing without signs and symptoms of infection Outcome: Progressing Goal: Risk for impaired skin integrity will decrease Outcome: Progressing

## 2024-03-03 NOTE — Progress Notes (Addendum)
      [address]       [place] [address]             [phone]      24 Days Post-Op Procedure(s) (LRB): MITRAL VALVE REPAIR USING A SIMULUS SEMI-RIGID ANNULOPLASTY BAND (N/A) MAZE PROCEDURE (N/A) CLIPPING, LEFT ATRIAL APPENDAGE USING A MEDTRONIC CLIP (N/A) ECHOCARDIOGRAM, TRANSESOPHAGEAL (N/A)  Subjective:  Sitting up in chair.  Doing well, feeling a little better today.  + ambulation   + BM  Objective: Vital signs in last 24 hours: Temp:  [97.7 F (36.5 C)-98.2 F (36.8 C)] 97.8 F (36.6 C) (05/03 0350) Pulse Rate:  [76-81] 76 (05/03 0350) Cardiac Rhythm: Normal sinus rhythm (05/03 0355) Resp:  [18-20] 19 (05/03 0350) BP: (93-102)/(47-53) 100/53 (05/03 0350) SpO2:  [96 %-98 %] 98 % (05/03 0350) Weight:  [68.6 kg] 68.6 kg (05/03 0625)  Hemodynamic parameters for last 24 hours: CVP:  [7 mmHg-59 mmHg] 8 mmHg  Intake/Output from previous day: 05/02 0701 - 05/03 0700 In: 830 [P.O.:830] Out: 1550 [Urine:1550]  General appearance: alert, cooperative, and no distress Heart: regular rate and rhythm Lungs: diminished breath sounds on right Abdomen: soft, non-tender; bowel sounds normal; no masses,  no organomegaly Extremities: edema bilateral, non-pitting Wound: clean and dry  Lab Results: Recent Labs    03/01/24 0349 03/02/24 0425  WBC 9.0 8.5  HGB 8.2* 8.2*  HCT 25.1* 25.3*  PLT 190 187   BMET:  Recent Labs    03/02/24 0425 03/03/24 0450  NA 133* 132*  K 4.3 4.1  CL 98 97*  CO2 26 25  GLUCOSE 107* 100*  BUN 37* 29*  CREATININE 1.51* 1.56*  CALCIUM  8.5* 8.2*    PT/INR: No results for input(s): "LABPROT", "INR" in the last 72 hours. ABG    Component Value Date/Time   PHART 7.367 02/08/2024 1849   HCO3 23.9 02/08/2024 1849   TCO2 25 02/08/2024 1849   ACIDBASEDEF 2.0 02/08/2024 1849   O2SAT 82.9 02/28/2024 0447   CBG (last 3)  No results for input(s): "GLUCAP" in the last 72 hours.  Assessment/Plan: S/P  Procedure(s) (LRB): MITRAL VALVE REPAIR USING A SIMULUS SEMI-RIGID ANNULOPLASTY BAND (N/A) MAZE PROCEDURE (N/A) CLIPPING, LEFT ATRIAL APPENDAGE USING A MEDTRONIC CLIP (N/A) ECHOCARDIOGRAM, TRANSESOPHAGEAL (N/A)  CV- H/O Persistent A. Fib, failed cardioversion.. remains in NSR today... on Amiodarone  400 mg BID and Apixaban  Pulm- CXR obtained shows small right pleural effusion.. not large enough to warrant repeat Thoracentesis.Aaron Aas not requiring oxygen, continue IS Acute Systolic Heart Failure, non-ischemic cardiomyopathy- AHF adjusting medications as able.. currently on Spironolactone , Farxiga  Renal- Non-oliguric AKI, creatinine remains stable, diuretics per AHF Hypervolemic Hyponatremia- SIADH, sodium improving, has received Tolvaptan  Deconditioning- PT/OT recs H/H,  per CM.[facility] Aas patient declined SNF placement Dispo- patient making progress, getting closer to discharge, one final heart failure medication regimen can be finalized   LOS: 24 days   Gates Kasal, PA-C 03/03/2024   Chart reviewed, patient examined, agree with above.  She still has a lot of LE edema. Demedex resumed.  She has now decided with husband that she wants to go to SNF because he does not think he can take care of her at home yet. She is not walking much yet and requires help to get from bed to chair and back. She would like to go to [facility] which is close to their home. Will need to get social work involved.

## 2024-03-03 NOTE — Progress Notes (Addendum)
 [name] is an 83 y.o. female with a past medical history HTN and PAF who presents with shortness of breath with severe mitral regurgitation status post mitral valve replacement, MAZE, and LAA clipping.  Course complicated by systolic heart failure.  We were consulted for hyponatremia.   Assessment/Plan: Hypervolemic hyponatremia: Worsening and persistent.  No obvious symptoms.  Poor appetite for months now is certainly a reset osmostat could be playing a factor as well.  Likely multifactorial but certainly volume excess. - IV Lasix  80 mg 2 times Sunday and given shortness of breath, 2+ edema now with Unna boots, slow improvement in sodium s/p 1 dose on 4/28 -> now transitioned to torsemide . -Stopped the  SGLT2 with worsening renal function. 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 Did not tolerate Ure-Na; much better after tolvaptan  and given on 5/1.  OK to discontinue NaCl tablets.  Restarted diuretics on 5/2 -> Torsemide  40mg  daily dosing written today.  Good response to daily dosing and can uptitrate if needed as long as renal function remains fairly stable.  Signing off at this time; please reconsult as needed.  Nonoliguric AKI: Creatinine has been elevated for most of the hospitalization but elevation has been fairly mild.  Likely some degree of ischemic injury during surgery.  Cholesterol embolic injury is always possible.  Also may just have lower GFR in the setting of her heart failure with new cardiorenal syndrome.  Stable.  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  Followed by heart failure team previously.  Continue with diuresis as above.  Continue with goal-directed medical therapy which has already been initiated.  Monitor low blood pressures closely. Started SGLT2i which has efficacy in CHF but also in  hyponatremia mgmt   Atrial fibrillation with RVR: Management per primary team   Hypokalemia: On spironolactone .  Supplement as needed  Subjective: Breathing much better after thoracentesis, intermittent cough but denies any nausea, headaches, severe pain.  Overall she states that she is feeling better today.  Still has difficulty swallowing but tolerating softer items (unable to chew and swallow the toast), dizziness with ambulation.     Chemistry and CBC: Creatinine, Ser  Date/Time Value Ref Range Status  03/03/2024 04:50 AM 1.56 (H) 0.44 - 1.00 mg/dL Final  09/81/1914 78:29 AM 1.51 (H) 0.44 - 1.00 mg/dL Final  56/21/3086 57:84 AM 1.56 (H) 0.44 - 1.00 mg/dL Final  69/62/9528 41:32 AM 1.56 (H) 0.44 - 1.00 mg/dL Final  44/11/270 53:66 AM 1.57 (H) 0.44 - 1.00 mg/dL Final  44/12/4740 59:56 PM 1.47 (H) 0.44 - 1.00 mg/dL Final  38/75/6433 29:51 AM 1.41 (H) 0.44 - 1.00 mg/dL Final  88/41/6606 30:16 PM 1.35 (H) 0.44 - 1.00 mg/dL Final  11/09/3233 57:32 AM 1.23 (H) 0.44 - 1.00 mg/dL Final  20/25/4270 62:37 PM 1.26 (H) 0.44 - 1.00 mg/dL Final  62/83/1517 61:60 PM 1.37 (H) 0.44 - 1.00 mg/dL Final  73/71/0626 94:85 AM 1.35 (H) 0.44 - 1.00 mg/dL Final  46/27/0350 09:38 AM 1.25 (H) 0.44 - 1.00 mg/dL Final  18/29/9371 69:67 AM 1.40 (H) 0.44 - 1.00 mg/dL Final  89/38/1017 51:02 AM 1.33 (H) 0.44 - 1.00 mg/dL Final  58/52/7782 42:35 AM 1.28 (H) 0.44 - 1.00 mg/dL Final  36/14/4315 40:08 PM 1.37 (H) 0.44 - 1.00 mg/dL Final  67/61/9509 32:67 AM 1.44 (H) 0.44 - 1.00 mg/dL Final  02/21/2024 04:05 PM 1.48 (H) 0.44 - 1.00 mg/dL Final  82/95/6213 08:65 AM 1.47 (H) 0.44 - 1.00 mg/dL Final  78/46/9629 52:84 PM 1.41 (H) 0.44 - 1.00 mg/dL Final  13/24/4010 27:25 AM 1.39 (H) 0.44 - 1.00 mg/dL Final  36/64/4034 74:25 PM 1.31 (H) 0.44 - 1.00 mg/dL Final  95/63/8756 43:32 AM 1.41 (H) 0.44 - 1.00 mg/dL Final  95/18/8416 60:63 PM 1.49 (H) 0.44 - 1.00 mg/dL Final  01/60/1093 23:55 AM 1.46 (H) 0.44 - 1.00 mg/dL  Final  73/22/0254 27:06 PM 1.54 (H) 0.44 - 1.00 mg/dL Final  23/76/2831 51:76 AM 1.48 (H) 0.44 - 1.00 mg/dL Final  16/05/3709 62:69 AM 1.34 (H) 0.44 - 1.00 mg/dL Final  48/54/6270 35:00 PM 1.28 (H) 0.44 - 1.00 mg/dL Final  93/81/8299 37:16 AM 1.29 (H) 0.44 - 1.00 mg/dL Final  96/78/9381 01:75 AM 1.15 (H) 0.44 - 1.00 mg/dL Final  08/25/8526 78:24 AM 1.13 (H) 0.44 - 1.00 mg/dL Final  23/53/6144 31:54 AM 1.22 (H) 0.44 - 1.00 mg/dL Final  00/86/7619 50:93 AM 1.18 (H) 0.44 - 1.00 mg/dL Final  26/71/2458 09:98 AM 1.17 (H) 0.44 - 1.00 mg/dL Final  33/82/5053 97:67 PM 1.27 (H) 0.44 - 1.00 mg/dL Final  34/19/3790 24:09 AM 0.91 0.44 - 1.00 mg/dL Final  73/53/2992 42:68 PM 0.86 0.44 - 1.00 mg/dL Final  34/19/6222 97:98 AM 0.90 0.44 - 1.00 mg/dL Final  92/09/9416 40:81 AM 0.90 0.44 - 1.00 mg/dL Final  44/81/8563 14:97 AM 0.80 0.44 - 1.00 mg/dL Final  02/63/7858 85:02 AM 0.90 0.44 - 1.00 mg/dL Final  77/41/2878 67:67 AM 1.00 0.44 - 1.00 mg/dL Final  20/94/7096 28:36 AM 1.15 (H) 0.44 - 1.00 mg/dL Final  62/94/7654 65:03 AM 0.81 0.44 - 1.00 mg/dL Final  54/65/6812 75:17 PM 0.76 0.44 - 1.00 mg/dL Final  00/17/4944 96:75 PM 0.95 0.57 - 1.00 mg/dL Final  91/63/8466 59:93 AM 1.13 (H) 0.57 - 1.00 mg/dL Final  57/11/7791 90:30 AM 0.88 0.44 - 1.00 mg/dL Final  07/24/3006 62:26 AM 0.88 0.44 - 1.00 mg/dL Final  33/35/4562 56:38 PM 1.12 0.40 - 1.20 mg/dL Final   Recent Labs  Lab 02/28/24 0447 02/28/24 1500 02/29/24 0447 03/01/24 0349 03/01/24 0850 03/01/24 1630 03/02/24 0425 03/03/24 0450  NA 124* 123* 124* 126* 129* 132* 133* 132*  K 3.4* 3.8 4.2 4.2 3.8  --  4.3 4.1  CL 87* 86* 88* 92* 93*  --  98 97*  CO2 27 27 27 27 27   --  26 25  GLUCOSE 102* 114* 97 104* 146*  --  107* 100*  BUN 60* 68* 68* 59* 55*  --  37* 29*  CREATININE 1.41* 1.47* 1.57* 1.56* 1.56*  --  1.51* 1.56*  CALCIUM  7.9* 8.4* 7.9* 8.3* 8.9  --  8.5* 8.2*   Recent Labs  Lab 02/29/24 0447 03/01/24 0349 03/02/24 0425  WBC  9.4 9.0 8.5  HGB 8.6* 8.2* 8.2*  HCT 25.9* 25.1* 25.3*  MCV 88.7 89.6 91.3  PLT 214 190 187   Liver Function Tests: Recent Labs  Lab 03/01/24 0850  AST 31  ALT 34  ALKPHOS 54  BILITOT 1.4*  PROT 5.0*  ALBUMIN  2.6*   No results for input(s): "LIPASE", "AMYLASE" in the last 168 hours. No results for input(s): "AMMONIA" in the last 168 hours. Cardiac Enzymes: No results for input(s): "CKTOTAL", "CKMB", "CKMBINDEX", "TROPONINI" in the last 168 hours. Iron  Studies: No results for input(s): "IRON ", "TIBC", "TRANSFERRIN", "FERRITIN" in the last 72 hours. PT/INR: @LABRCNTIP (inr:5)  Xrays/Other Studies: ) Results for orders placed or performed during the hospital encounter of 02/08/24 (from the past 48 hours)  Sodium     Status: Abnormal   Collection Time: 03/01/24  4:30 PM  Result Value Ref Range   Sodium 132 (L) 135 - 145 mmol/L    Comment: Performed at [facility], [address] [place], [place] [address]  Basic metabolic panel     Status: Abnormal   Collection Time: 03/02/24  4:25 AM  Result Value Ref Range   Sodium 133 (L) 135 - 145 mmol/L   Potassium 4.3 3.5 - 5.1 mmol/L   Chloride 98 98 - 111 mmol/L   CO2 26 22 - 32 mmol/L   Glucose, Bld 107 (H) 70 - 99 mg/dL    Comment: Glucose reference range applies only to samples taken after fasting for at least 8 hours.   BUN 37 (H) 8 - 23 mg/dL   Creatinine, Ser 6.04 (H) 0.44 - 1.00 mg/dL   Calcium  8.5 (L) 8.9 - 10.3 mg/dL   GFR, Estimated 35 (L) >60 mL/min    Comment: (NOTE) Calculated using the CKD-EPI Creatinine Equation (2021)    Anion gap 9 5 - 15    Comment: Performed at [facility], [address] [place], [place] [address]  Magnesium      Status: None   Collection Time: 03/02/24  4:25 AM  Result Value Ref Range   Magnesium  2.3 1.7 - 2.4 mg/dL    Comment: Performed at [facility], [address], [place], [place] [address]  CBC     Status: Abnormal   Collection Time: 03/02/24  4:25 AM   Result Value Ref Range   WBC 8.5 4.0 - 10.5 K/uL   RBC 2.77 (L) 3.87 - 5.11 MIL/uL   Hemoglobin 8.2 (L) 12.0 - 15.0 g/dL   HCT 78.2 (L) 95.6 - 21.3 %   MCV 91.3 80.0 - 100.0 fL   MCH 29.6 26.0 - 34.0 pg   MCHC 32.4 30.0 - 36.0 g/dL   RDW 08.6 (H) 57.8 - 46.9 %   Platelets 187 150 - 400 K/uL   nRBC 0.0 0.0 - 0.2 %    Comment: Performed at [facility], [address], [place], [place] [address]  Basic metabolic panel     Status: Abnormal   Collection Time: 03/03/24  4:50 AM  Result Value Ref Range   Sodium 132 (L) 135 - 145 mmol/L   Potassium 4.1 3.5 - 5.1 mmol/L   Chloride 97 (L) 98 - 111 mmol/L   CO2 25 22 - 32 mmol/L   Glucose, Bld 100 (H) 70 - 99 mg/dL    Comment: Glucose reference range applies only to samples taken after fasting for at least 8 hours.   BUN 29 (H) 8 - 23 mg/dL   Creatinine, Ser 8.41 (H) 0.44 - 1.00 mg/dL   Calcium  8.2 (L) 8.9 - 10.3 mg/dL   GFR, Estimated 33 (L) >60 mL/min    Comment: (NOTE) Calculated using the CKD-EPI Creatinine Equation (2021)    Anion gap 10 5 - 15    Comment: Performed at [facility], [address], [place], [place] [address]   DG CHEST PORT 1 VIEW Result Date: 03/02/2024 CLINICAL DATA:  Pleural effusion EXAM: PORTABLE CHEST 1 VIEW COMPARISON:  02/29/2024 FINDINGS: Stable indistinct opacities at the lung bases favoring bilateral pleural effusions with passive atelectasis. Stable mitral valve prosthesis and left atrial  appendage clip. Atherosclerotic calcification of the aortic arch. Right PICC line tip: Cavoatrial junction. IMPRESSION: 1. Stable indistinct opacities at the lung bases favoring bilateral pleural effusions with passive atelectasis. 2. Stable mitral valve prosthesis and left atrial appendage clip. 3. Right PICC line tip: Cavoatrial junction. Electronically Signed   By: Freida Jes M.D.   On: 03/02/2024 16:14     PMH:   Past Medical History:  Diagnosis Date   Acid reflux    Anemia    Arthritis     Atrial fibrillation (HCC)    Cataract    Diverticulosis of colon    Endometrial cancer (HCC)    1A grade endometrial ca   Essential hypertension 01/24/2009   Qualifier: Diagnosis of  By: Nelson-Smith CMA (AAMA), Dottie     Hx of radiation therapy 04/24/08-05/29/08& 8/12/,8/19,&06/27/08   external beam  through 7/09 ,then intracavity in 06/2008   Hypertension    Mitral valve prolapse    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   Tricuspid regurgitation     PSH:   Past Surgical History:  Procedure Laterality Date   APPENDECTOMY     1967   arthroscopic knee right surgery     CLIPPING OF ATRIAL APPENDAGE N/A 02/08/2024   Procedure: CLIPPING, LEFT ATRIAL APPENDAGE USING A MEDTRONIC CLIP;  Surgeon: Melene Sportsman, MD;  Location: [facility] OR;  Service: Open Heart Surgery;  Laterality: N/A;   ECTOPIC PREGNANCY SURGERY  1967   IR THORACENTESIS ASP PLEURAL SPACE W/IMG GUIDE  02/27/2024   iron  infusion  12/2022   LAPAROSCOPIC HYSTERECTOMY     total   MAZE N/A 02/08/2024   Procedure: MAZE PROCEDURE;  Surgeon: Melene Sportsman, MD;  [facility] OR;  Service: Open Heart Surgery;  Laterality: N/A;   MITRAL VALVE REPAIR N/A 02/08/2024   Procedure: MITRAL VALVE REPAIR USING A SIMULUS SEMI-RIGID ANNULOPLASTY BAND;  Surgeon: Melene Sportsman, MD;  Location: [facility] OR;  Service: Open Heart Surgery;  Laterality: N/A;  possible replacement   ORTHOPEDIC SURGERY     Orthoscopic Knee Surgery   RIGHT/LEFT HEART CATH AND CORONARY ANGIOGRAPHY N/A 12/30/2023   Procedure: RIGHT/LEFT HEART CATH AND CORONARY ANGIOGRAPHY;  Surgeon: [name], MD;  Location: [facility] INVASIVE CV LAB;  Service: Cardiovascular;  Laterality: N/A;   TEAR DUCT PROBING     TEE WITHOUT CARDIOVERSION N/A 02/08/2024   Procedure: ECHOCARDIOGRAM, TRANSESOPHAGEAL;  Surgeon: Melene Sportsman, MD;  Location: [facility] OR;  Service: Open Heart Surgery;  Laterality: N/A;   TOOTH EXTRACTION     bone graft   TRANSESOPHAGEAL ECHOCARDIOGRAM (CATH LAB) N/A 12/30/2023   Procedure:  TRANSESOPHAGEAL ECHOCARDIOGRAM;  Surgeon: Euell Herrlich, MD;  Location: [facility] INVASIVE CV LAB;  Service: Cardiovascular;  Laterality: N/A;    Allergies:  Allergies  Allergen Reactions   Crestor [Rosuvastatin] Other (See Comments)    Leg cramps   Glycopyrrolate      Other reaction(s): extreme dry mouth   Codeine Other (See Comments)    Hyper    Medications:   Prior to Admission medications   Medication Sig Start Date End Date Taking? Authorizing Provider  amiodarone  (PACERONE ) 200 MG tablet Take 1 tablet (200 mg total) by mouth daily. 01/13/24  Yes Meng, Hao, PA  apixaban  (ELIQUIS ) 5 MG TABS tablet Take 1 tablet (5 mg total) by mouth 2 (two) times daily. 01/26/24  Yes [name], MD  b complex vitamins tablet Take 1 tablet by mouth daily.     Yes [provider]  Bempedoic Acid -Ezetimibe  (NEXLIZET) 180-10 MG  TABS Take 1 tablet by mouth daily.   Yes [provider]  CALCIUM  PO Take 650 mg by mouth daily.   Yes [provider]  Cholecalciferol (VITAMIN D3) 125 MCG (5000 UT) CAPS Take 5,000 Units by mouth daily.   Yes [provider]  denosumab  (PROLIA ) 60 MG/ML SOSY injection Inject 60 mg into the skin every 6 (six) months.   Yes [provider]  furosemide  (LASIX ) 20 MG tablet Take 1 tablet (20 mg total) by mouth daily. 01/12/24 01/11/25 Yes Melene Sportsman, MD  Garlic 1000 MG CAPS Take 1,000 mg by mouth daily.   Yes [provider]  Joneen Nelson Omega-3 300 MG CAPS Take 300 mg by mouth daily.   Yes [provider]  loperamide  (IMODIUM ) 2 MG capsule Take 1 capsule (2 mg total) by mouth 4 (four) times daily as needed for diarrhea or loose stools. 01/10/21  Yes Trish Furl, MD  metoprolol  succinate (TOPROL -XL) 25 MG 24 hr tablet Take 25 mg by mouth 2 (two) times daily.  Take additional tablet for palpitations   Yes [provider]  Multiple Vitamins-Minerals (MULTIVITAMINS THER. W/MINERALS) TABS Take 1 tablet by mouth daily.      Yes [provider]  pantoprazole  (PROTONIX ) 40 MG tablet Take 40 mg by mouth daily.   Yes [provider]  potassium chloride  SA (KLOR-CON  M) 20 MEQ tablet Take 0.5 tablets (10 mEq total) by mouth daily. 01/12/24  Yes Melene Sportsman, MD  Zinc 50 MG TABS Take 50 mg by mouth daily.   Yes [provider]  apixaban  (ELIQUIS ) 5 MG TABS tablet Take 1 tablet (5 mg total) by mouth 2 (two) times daily. 01/31/24   [name], MD  atorvastatin  (LIPITOR) 10 MG tablet Take 1 tablet (10 mg total) by mouth daily. Patient not taking: Reported on 01/25/2024 12/20/23 03/19/24  [name], MD  ezetimibe  (ZETIA ) 10 MG tablet Take 1 tablet (10 mg total) by mouth daily. Patient not taking: Reported on 01/25/2024 12/20/23 03/19/24  [name], MD    Discontinued Meds:   Medications Discontinued During This Encounter  Medication Reason   EPINEPHrine  (ADRENALIN ) 5 mg in NS 250 mL (0.02 mg/mL) premix infusion    milrinone  (PRIMACOR ) 20 MG/100 ML (0.2 mg/mL) infusion    nitroGLYCERIN  50 mg in dextrose  5 % 250 mL (0.2 mg/mL) infusion    norepinephrine  (LEVOPHED ) 4mg  in (0.016 mg/mL) premix infusion    potassium chloride  injection 80 mEq    heparin  30,000 units/NS 1000 mL solution for CELLSAVER    heparin  sodium (porcine) 2,500 Units, papaverine 30 mg in electrolyte-A (PLASMALYTE-A PH 7.4) 500 mL irrigation    tranexamic acid  (CYKLOKAPRON ) pump prime solution 137 mg    vancomycin  (VANCOCIN ) 1,000 mg in sodium chloride  0.9 % 1,000 mL irrigation    [facility] Blood Cardioplegia vial (lidocaine /magnesium /mannitol  0.26g-4g-6.4g)    [facility] Blood Cardioplegia vial (lidocaine /magnesium /mannitol  0.26g-4g-6.4g)    [facility] Patient [facility] Family Education    metoprolol  tartrate (LOPRESSOR ) tablet 12.5 mg    chlorhexidine  (HIBICLENS ) 4 % liquid 2 Application    0.9 % irrigation (POUR BTL)    vancomycin  (VANCOCIN ) 1,000 mg in sodium chloride  0.9 % 1,000 mL irrigation    Surgifoam 1 Gm  with 0.9% sodium chloride  (4 ml) topical solution Patient Discharge   Bempedoic Acid -Ezetimibe  180-10 MG TABS 1 tablet Not available   sodium chloride  flush (NS) 0.9 % injection 3-10 mL    sodium chloride  flush (NS) 0.9 %  injection 3-10 mL    sodium chloride  flush (NS) 0.9 % injection 3-10 mL    sodium chloride  flush (NS) 0.9 % injection 3-10 mL    sodium chloride  flush (NS) 0.9 % injection 3-10 mL    sodium chloride  flush (NS) 0.9 % injection 3-10 mL    acetaminophen  (TYLENOL ) 160 MG/5ML solution 1,000 mg Change in therapy   aspirin  chewable tablet 324 mg Change in therapy   metoprolol  tartrate (LOPRESSOR ) 25 mg/10 mL oral suspension 12.5 mg Change in therapy   0.45 % sodium chloride  infusion Change in therapy   acetaminophen  (TYLENOL ) 160 MG/5ML solution 650 mg    Oral care mouth rinse    Oral care mouth rinse    midazolam  (VERSED ) injection 2 mg    dexmedetomidine  (PRECEDEX ) 400 MCG/100ML (4 mcg/mL) infusion    nitroGLYCERIN  50 mg in dextrose  5 % 250 mL (0.2 mg/mL) infusion    amiodarone  (PACERONE ) tablet 400 mg    EPINEPHrine  (ADRENALIN ) 5 mg in NS 250 mL (0.02 mg/mL) premix infusion    potassium chloride  SA (KLOR-CON  M) CR tablet 20 mEq    amiodarone  (NEXTERONE ) 1.5 mg/mL IV bolus only 150 mg Entry Error   insulin  regular, human (MYXREDLIN ) 100 units/ 100 mL infusion    enoxaparin  (LOVENOX ) injection 30 mg    phenylephrine  (NEO-SYNEPHRINE) 20mg /NS premix infusion    metoprolol  tartrate (LOPRESSOR ) tablet 12.5 mg    aspirin  EC tablet 325 mg    vasopressin  (PITRESSIN) 20 Units in 100 mL (0.2 unit/mL) infusion-*FOR SHOCK*    amiodarone  (NEXTERONE  PREMIX) 360-4.14 MG/200ML-% (1.8 mg/mL) IV infusion    norepinephrine  (LEVOPHED ) 16 mg in (0.064 mg/mL) premix infusion    heparin  ADULT infusion 100 units/mL (25000 units/250mL)    amiodarone  (NEXTERONE ) 1.5 mg/mL IV bolus only 150 mg    apixaban  (ELIQUIS ) tablet 2.5 mg    amiodarone  (NEXTERONE ) 1.8 mg/mL load via infusion 150  mg    scopolamine  (TRANSDERM-SCOP) 1 MG/3DAYS 1.5 mg    amiodarone  (PACERONE ) tablet 400 mg Duplicate   furosemide  (LASIX ) injection 40 mg    amiodarone  (PACERONE ) tablet 400 mg    amiodarone  (NEXTERONE ) 1.5 mg/mL IV bolus only 150 mg    potassium chloride  10 mEq in 50 mL *CENTRAL LINE* IVPB    docusate sodium  (COLACE) capsule 200 mg    bisacodyl  (DULCOLAX) EC tablet 10 mg    bisacodyl  (DULCOLAX) suppository 10 mg    amiodarone  (NEXTERONE ) 1.5 mg/mL IV bolus only 150 mg    senna (SENOKOT) tablet 8.6 mg    potassium chloride  SA (KLOR-CON  M) CR tablet 40 mEq    ondansetron  (ZOFRAN ) injection 4 mg    digoxin  (LANOXIN ) 0.1 MG/ML injection 0.5 mg Change in therapy   digoxin  (LANOXIN ) 0.1 MG/ML injection 0.25 mg Change in therapy   bethanechol  (URECHOLINE ) tablet 5 mg Discontinued by provider   potassium chloride  (KLOR-CON ) packet 40 mEq    potassium chloride  10 mEq in 50 mL *CENTRAL LINE* IVPB    loperamide  HCl (IMODIUM ) 1 MG/7.5ML suspension 2 mg    potassium chloride  SA (KLOR-CON  M) CR tablet 40 mEq    potassium chloride  (KLOR-CON ) packet 40 mEq    potassium chloride  (KLOR-CON ) packet 40 mEq Duplicate   aspirin  chewable tablet 81 mg    traMADol  (ULTRAM ) tablet 50 mg    sodium chloride  flush (NS) 0.9 % injection 3 mL    sodium chloride  flush (NS) 0.9 % injection 10-40 mL    fentaNYL  (SUBLIMAZE ) injection 100 mcg  sodium chloride  flush (NS) 0.9 % injection 3-10 mL    sodium chloride  flush (NS) 0.9 % injection 10-40 mL    potassium chloride  SA (KLOR-CON  M) CR tablet 40 mEq    potassium chloride  (KLOR-CON ) packet 20 mEq    potassium chloride  10 mEq in 50 mL *CENTRAL LINE* IVPB    metoprolol  tartrate (LOPRESSOR ) injection 2.5-5 mg    digoxin  (LANOXIN ) tablet 0.125 mg    digoxin  (LANOXIN ) tablet 0.0625 mg    furosemide  (LASIX ) 200 mg in dextrose  5 % 100 mL (2 mg/mL) infusion    potassium chloride  SA (KLOR-CON  M) CR tablet 40 mEq    potassium chloride  SA (KLOR-CON  M) CR tablet 40 mEq     aspirin  EC tablet 81 mg    milrinone  (PRIMACOR ) 20 MG/100 ML (0.2 mg/mL) infusion    traMADol  (ULTRAM ) tablet 50 mg    amiodarone  (NEXTERONE  PREMIX) 360-4.14 MG/200ML-% (1.8 mg/mL) IV infusion    morphine  (PF) 2 MG/ML injection 1-4 mg    potassium chloride  SA (KLOR-CON  M) CR tablet 20 mEq    potassium chloride  (KLOR-CON  M) CR tablet 30 mEq    spironolactone  (ALDACTONE ) tablet 12.5 mg    potassium chloride  (KLOR-CON  M) CR tablet 30 mEq    amiodarone  (PACERONE ) tablet 400 mg    amiodarone  (PACERONE ) tablet 200 mg    dextrose  50 % solution 0-50 mL    insulin  aspart (novoLOG ) injection 2-6 Units    feeding supplement (BOOST / RESOURCE BREEZE) liquid 1 Container    sodium chloride  flush (NS) 0.9 % injection 3 mL    sodium chloride  flush (NS) 0.9 % injection 3 mL    0.9 %  sodium chloride  infusion    amiodarone  (PACERONE ) tablet 200 mg    digoxin  (LANOXIN ) tablet 0.0625 mg    torsemide  (DEMADEX ) tablet 40 mg    potassium chloride  SA (KLOR-CON  M) CR tablet 40 mEq    torsemide  (DEMADEX ) tablet 40 mg    urea  (URE-NA) oral packet 15 g    furosemide  (LASIX ) injection 80 mg    potassium chloride  SA (KLOR-CON  M) CR tablet 20 mEq    potassium chloride  SA (KLOR-CON  M) CR tablet 20 mEq    urea  (URE-NA) oral packet 30 g    losartan  (COZAAR ) tablet 12.5 mg    potassium chloride  SA (KLOR-CON  M) CR tablet 20 mEq    urea  (URE-NA) oral packet 15 g    dapagliflozin  propanediol (FARXIGA ) tablet 10 mg    potassium chloride  (KLOR-CON  M) CR tablet 30 mEq    torsemide  (DEMADEX ) tablet 40 mg    potassium chloride  SA (KLOR-CON  M) CR tablet 20 mEq    sodium chloride  tablet 1 g     Social History:  reports that she has never smoked. She has never used smokeless tobacco. She reports current alcohol use. She reports that she does not use drugs.  Family History:   Family History  Problem Relation Age of Onset   Diabetes type II Mother    Breast cancer Mother    Leukemia Father    Diabetes type II Sister     Breast cancer Sister    Cancer Brother 65       throat 10/2020   Pancreatic cancer Maternal Aunt        x2   Diabetes Maternal Grandmother    Diabetes Maternal Grandfather    Colon cancer Neg Hx    Stomach cancer Neg Hx    Esophageal cancer Neg Hx  Blood pressure (!) 108/56, pulse 77, temperature 98.6 F (37 C), temperature source Oral, resp. rate (!) 24, height 5\' 4"  (1.626 m), weight 68.6 kg, SpO2 95%. Physical Exam: General: NAD HEENT: anicteric sclera CV: Normal rate, no rub, 2+ pitting edema in the bilateral lower extremities Lungs: Mild increased work of breathing, trace rales Abd: SNDNT +BS Skin: no visible lesions or rashes Musculoskeletal: no obvious deformities Neuro: normal speech, no gross focal deficits  Ext: 2+ edema R>L     Patrick Boor, MD 03/03/2024, 9:05 AM

## 2024-03-03 NOTE — Progress Notes (Signed)
 Mobility Specialist Progress Note:    03/03/24 1110  Mobility  Activity Transferred to/from [facility]  Level of Assistance Minimal assist, patient does 75% or more  Assistive Device Front wheel walker  Distance Ambulated (ft) 5 ft  RUE Weight Bearing Per Provider Order  (sternal precautions)  LUE Weight Bearing Per Provider Order  (sternal percautions)  Activity Response Tolerated well  Mobility Referral Yes  Mobility visit 1 Mobility  Mobility Specialist Start Time (ACUTE ONLY) 1050  Mobility Specialist Stop Time (ACUTE ONLY) 1102  Mobility Specialist Time Calculation (min) (ACUTE ONLY) 12 min   Pt received on [facility] requesting assistance getting back to bed. Pt required MinA for STS to maintain sternal precautions. Was bale to take a couple steps back to bed. No c/o throughout. Left supine in bed w/ call bell and personal belongings in reach. All needs met.   Inetta Manes Mobility Specialist  Please contact vis Secure Chat or  Rehab Office [phone]

## 2024-03-04 LAB — BASIC METABOLIC PANEL WITH GFR
Anion gap: 9 (ref 5–15)
BUN: 27 mg/dL — ABNORMAL HIGH (ref 8–23)
CO2: 25 mmol/L (ref 22–32)
Calcium: 7.8 mg/dL — ABNORMAL LOW (ref 8.9–10.3)
Chloride: 98 mmol/L (ref 98–111)
Creatinine, Ser: 1.64 mg/dL — ABNORMAL HIGH (ref 0.44–1.00)
GFR, Estimated: 31 mL/min — ABNORMAL LOW (ref >60)
Glucose, Bld: 91 mg/dL (ref 70–99)
Potassium: 4 mmol/L (ref 3.5–5.1)
Sodium: 132 mmol/L — ABNORMAL LOW (ref 135–145)

## 2024-03-04 MED ORDER — POLYETHYLENE GLYCOL 3350 17 G PO PACK
17.0000 g | PACK | Freq: Every day | ORAL | Status: DC
  Administered 2024-03-05: 17 g via ORAL
  Filled 2024-03-04 (×3): qty 1

## 2024-03-04 MED ORDER — TORSEMIDE 20 MG PO TABS
20.0000 mg | ORAL_TABLET | Freq: Every day | ORAL | Status: DC
  Administered 2024-03-04 – 2024-03-05 (×2): 20 mg via ORAL
  Filled 2024-03-04 (×2): qty 1

## 2024-03-04 MED ORDER — LACTULOSE 10 GM/15ML PO SOLN
20.0000 g | Freq: Once | ORAL | Status: DC
  Filled 2024-03-04: qty 30

## 2024-03-04 MED ORDER — APIXABAN 2.5 MG PO TABS
2.5000 mg | ORAL_TABLET | Freq: Two times a day (BID) | ORAL | Status: DC
  Administered 2024-03-04 – 2024-03-08 (×9): 2.5 mg via ORAL
  Filled 2024-03-04 (×9): qty 1

## 2024-03-04 MED ORDER — LACTULOSE 10 GM/15ML PO SOLN: 20.0000 g | Freq: Every day | ORAL | Status: DC | PRN

## 2024-03-04 NOTE — TOC Initial Note (Signed)
 Transition of Care [facility]) - Initial/Assessment Note    Patient Details  Name: [name] MRN: [id number] Date of Birth: [birth year]  Transition of Care [facility]) CM/SW Contact:    [name], LCSWA Phone Number: 03/04/2024, 9:49 AM  Clinical Narrative:                  CSW received consult for possible SNF placement at time of discharge. CSW spoke with patient regarding PT recommendation of SNF placement at time of discharge. Patient reports PTA she comes from home with spouse. Patient expressed understanding of PT recommendation and is agreeable to SNF placement at time of discharge. Patient reports preference for [facility] . CSW discussed insurance authorization process.No further questions reported at this time. CSW to continue to follow and assist with discharge planning needs.     Expected Discharge Plan: Skilled Nursing Facility Barriers to Discharge: Continued Medical Work up   Patient Goals and CMS Choice Patient states their goals for this hospitalization and ongoing recovery are:: SNF CMS Medicare.gov Compare Post Acute Care list provided to:: Patient Choice offered to / list presented to : Patient      Expected Discharge Plan and Services In-house Referral: Clinical Social Work Discharge Planning Services: CM Consult Post Acute Care Choice: Home Health Living arrangements for the past 2 months: Single Family Home                           HH Arranged: RN, PT, OT [facility] Agency: [facility] Date [facility] Agency Contacted: 02/27/24 Time HH Agency Contacted: 1339 Representative spoke with at [facility] Agency: [name]  Prior Living Arrangements/Services Living arrangements for the past 2 months: Single Family Home Lives with:: Spouse Patient language and need for interpreter reviewed:: Yes Do you feel safe going back to the place where you live?: No   SNF  Need for Family Participation in Patient Care: Yes (Comment) Care giver support system in place?: Yes  (comment) Current home services: DME (rolling walker) Criminal Activity/Legal Involvement Pertinent to Current Situation/Hospitalization: No - Comment as needed  Activities of Daily Living   ADL Screening (condition at time of admission) Independently performs ADLs?: Yes (appropriate for developmental age) Is the patient deaf or have difficulty hearing?: Yes Does the patient have difficulty seeing, even when wearing glasses/contacts?: No Does the patient have difficulty concentrating, remembering, or making decisions?: No  Permission Sought/Granted Permission sought to share information with : Case Manager, Family Supports, Oceanographer granted to share information with : Yes, Verbal Permission Granted  Share Information with NAME: [name]  Permission granted to share info w AGENCY: SNF  Permission granted to share info w Relationship: spouse  Permission granted to share info w Contact Information: [name] [phone]  Emotional Assessment Appearance:: Appears stated age Attitude/Demeanor/Rapport: Gracious Affect (typically observed): Calm Orientation: : Oriented to Self, Oriented to Place, Oriented to  Time, Oriented to Situation Alcohol / Substance Use: Not Applicable Psych Involvement: No (comment)  Admission diagnosis:  Severe mitral regurgitation [I34.0] 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.1] Atrial fibrillation, unspecified type [facility]) [I48.91] Patient Active Problem List   Diagnosis Date Noted   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   Severe mitral regurgitation 02/08/2024   Mitral regurgitation 12/31/2023   Nonrheumatic mitral valve regurgitation 12/30/2023   Atrial fibrillation with RVR (HCC) 12/30/2023   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   Anemia 02/01/2019   Iron  deficiency anemia 11/08/2018   Cancer (HCC)    Enteritis 09/17/2011  CANCER, ENDOMETRIUM 01/24/2009   Essential hypertension 01/24/2009   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   RECTAL BLEEDING 01/24/2009   Diarrhea 01/24/2009   HEMORRHOIDS, HX OF 01/24/2009   PCP:  [name], MD Pharmacy:   [phone] [employer] - [place], [place] - [address] [place] [address] Phone: [phone] Fax: [phone]  [facility] - [place], [place] - [address] [place] [address] Phone: [phone] Fax: [phone]     Social Drivers of Health (SDOH) Social History: SDOH Screenings   Food Insecurity: No Food Insecurity (02/08/2024)  Housing: Low Risk  (02/08/2024)  Transportation Needs: No Transportation Needs (02/08/2024)  Utilities: Not At Risk (02/08/2024)  Depression (PHQ2-9): Low Risk  (12/20/2023)  Social Connections: Socially Integrated (02/09/2024)  Tobacco Use: Low Risk  (02/08/2024)   SDOH Interventions:     Readmission Risk Interventions     No data to display

## 2024-03-04 NOTE — Consult Note (Signed)
 WOC Nurse Consult Note: Reason for Consult: stage 2 coccyx No image of Stage 2; however noted to have DTPI on the right buttock and sacrum;  Wound type: DTPI Pressure Injury POA: No Measurement: see nursing flow sheets Wound bed: dark purple; non blanchable tissue Drainage (amount, consistency, odor) see nursing flow sheets Periwound: Dressing procedure/placement/frequency: Cover wound with single layer of xeroform, top with foam. Change every other day Turn and reposition per hospital policy  WOC Nurse team will follow with you and see patient within 10 days for wound assessments.  Please notify WOC nurses of any acute changes in the wounds or any new areas of concern Azrielle Springsteen [facility] MSN, RN,CWOCN, CNS, CWON-AP [phone]

## 2024-03-04 NOTE — Plan of Care (Signed)

## 2024-03-04 NOTE — NC FL2 (Signed)
 [place]  MEDICAID FL2 LEVEL OF CARE FORM     IDENTIFICATION  Patient Name: [name] Birthdate: 25-Sep-1941 Sex: female Admission Date (Current Location): 02/08/2024  [facility] and [place] Number:  Producer, television/film/video and Address:  The [place], 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   Severe mitral regurgitation 02/08/2024   Mitral regurgitation 12/31/2023   Nonrheumatic mitral valve regurgitation 12/30/2023   Atrial fibrillation with RVR (HCC) 12/30/2023   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   Anemia 02/01/2019   Iron  deficiency anemia 11/08/2018   Cancer (HCC)    Enteritis 09/17/2011   CANCER, ENDOMETRIUM 01/24/2009   Essential hypertension 01/24/2009   DIVERTICULOSIS OF COLON 01/24/2009   RECTAL BLEEDING 01/24/2009   Diarrhea 01/24/2009   HEMORRHOIDS, HX OF 01/24/2009    Orientation RESPIRATION BLADDER Height & Weight     Self, Time, Situation, Place  Normal Continent Weight: 151 lb 9.6 oz (68.8 kg) Height:  5\' 4"  (162.6 cm)  BEHAVIORAL SYMPTOMS/MOOD NEUROLOGICAL BOWEL NUTRITION STATUS      Continent Diet (Please see discharge summary)  AMBULATORY STATUS COMMUNICATION OF NEEDS Skin   Limited Assist Verbally Other (Comment) (Abrasion,arm,L,Ecchymosis,arm,Bil.,Erythema,Buttocks,Bil.,Wound/Incision LDAs,PI Buttocks,R,deep tissue,Incision closed chest,other,Wound/Incision open or dehisced coccyx,posterior)                       Personal Care Assistance Level of Assistance   Bathing, Feeding, Dressing Bathing Assistance: Limited assistance Feeding assistance: Limited assistance Dressing Assistance: Limited assistance     Functional Limitations Info  Sight, Hearing, Speech Sight Info: Impaired Financial trader) Hearing Info: Impaired Speech Info: Adequate    SPECIAL CARE FACTORS FREQUENCY  PT (By licensed PT), OT (By licensed OT)     PT Frequency: 5x min weekly OT Frequency: 5x min weekly            Contractures Contractures Info: Not present    Additional Factors Info  Code Status, Allergies Code Status Info: FULL Allergies Info: Crestor (rosuvastatin),Glycopyrrolate ,Codeine           Current Medications (03/04/2024):  This is the current hospital active medication list Current Facility-Administered Medications  Medication Dose Route Frequency Provider Last Rate Last Admin   amiodarone  (PACERONE ) tablet 400 mg  400 mg Oral BID Diaz, Alma L, NP   400 mg at 03/04/24 0841   apixaban  (ELIQUIS ) tablet 5 mg  5 mg Oral BID Zimmerman, Donielle M, PA-C   5 mg at 03/04/24 8469   atorvastatin  (LIPITOR) tablet 10 mg  10 mg Oral Daily Zimmerman, Donielle M, PA-C   10 mg at 03/04/24 6295   Chlorhexidine  Gluconate Cloth 2 % PADS 6 each  6 each Topical Daily Zimmerman, Donielle M, PA-C   6 each at 03/04/24 2841   [facility], Patient & Family Education   Does not apply Once Zimmerman, Donielle M, PA-C       dapagliflozin  propanediol (FARXIGA ) tablet 10 mg  10 mg Oral Daily Arleene Belt, PA-C   10 mg at 03/04/24 3244  ezetimibe  (ZETIA ) tablet 10 mg  10 mg Oral Daily Zimmerman, Donielle M, PA-C   10 mg at 03/04/24 0841   feeding supplement (ENSURE ENLIVE / ENSURE PLUS) liquid 237 mL  237 mL Oral BID BM Trevor Fudge, MD   237 mL at 02/29/24 0854   lactulose (CHRONULAC) 10 GM/15ML solution 20 g  20 g Oral Once Barrett, Erin R, PA-C       [START ON 03/05/2024] lactulose (CHRONULAC) 10 GM/15ML solution 20 g  20 g Oral Daily PRN Barrett, Erin R,  PA-C       loperamide  (IMODIUM ) capsule 2 mg  2 mg Oral PRN Zimmerman, Donielle M, PA-C   2 mg at 02/28/24 1546   magnesium  gluconate (MAGONATE) tablet 500 mg  500 mg Oral Daily Gold, Wayne E, PA-C   500 mg at 03/04/24 4098   melatonin tablet 3 mg  3 mg Oral QHS Philmore Bream, MD   3 mg at 03/03/24 2107   multivitamin with minerals tablet 1 tablet  1 tablet Oral Daily Chand, Sudham, MD   1 tablet at 03/04/24 0841   ondansetron  (ZOFRAN ) injection 4 mg  4 mg Intravenous Q6H PRN Zimmerman, Donielle M, PA-C   4 mg at 02/20/24 1803   Oral care mouth rinse  15 mL Mouth Rinse PRN Zimmerman, Donielle M, PA-C       oxyCODONE  (Oxy IR/ROXICODONE ) immediate release tablet 5 mg  5 mg Oral Q4H PRN Zimmerman, Donielle M, PA-C   5 mg at 02/24/24 2112   pantoprazole  (PROTONIX ) EC tablet 40 mg  40 mg Oral Daily Zimmerman, Donielle M, PA-C   40 mg at 03/04/24 0841   phenol (CHLORASEPTIC) mouth spray 1 spray  1 spray Mouth/Throat PRN Zimmerman, Donielle M, PA-C       polyethylene glycol (MIRALAX  / GLYCOLAX ) packet 17 g  17 g Oral Daily Barrett, Erin R, PA-C       promethazine  (PHENERGAN ) 12.5 mg in sodium chloride  0.9 % 50 mL IVPB  12.5 mg Intravenous Q6H PRN Moira Andrews M, PA-C   Stopped at 02/20/24 0000   sodium chloride  flush (NS) 0.9 % injection 3 mL  3 mL Intravenous PRN Zimmerman, Donielle M, PA-C       sodium chloride  flush (NS) 0.9 % injection 3-10 mL  3-10 mL Intravenous Q12H Zimmerman, Donielle M, PA-C   10 mL at 03/04/24 1191   spironolactone  (ALDACTONE ) tablet 25 mg  25 mg Oral Daily Zimmerman, Donielle M, PA-C   25 mg at 03/04/24 0841   traMADol  (ULTRAM ) tablet 50 mg  50 mg Oral Q12H PRN Zimmerman, Donielle M, PA-C   50 mg at 02/27/24 2117   witch hazel-glycerin  (TUCKS) pad   Topical PRN Zimmerman, Donielle M, PA-C   1 Application at 03/02/24 1655     Discharge Medications: Please see discharge summary for a list of discharge medications.  Relevant Imaging Results:  Relevant Lab  Results:   Additional Information SSN-[id number]  [name], LCSWA

## 2024-03-04 NOTE — Progress Notes (Signed)
 Mobility Specialist Progress Note:    03/04/24 1205  Mobility  Activity Transferred to/from [facility];Transferred from bed to chair  Level of Assistance Contact guard assist, steadying assist  Assistive Device Front wheel walker  Distance Ambulated (ft) 6 ft  RUE Weight Bearing Per Provider Order NWB  LUE Weight Bearing Per Provider Order NWB  Activity Response Tolerated well  Mobility Referral Yes  Mobility visit 1 Mobility  Mobility Specialist Start Time (ACUTE ONLY) 1130  Mobility Specialist Stop Time (ACUTE ONLY) 1143  Mobility Specialist Time Calculation (min) (ACUTE ONLY) 13 min   Pt requested to go to the [facility] and to the chair. Pt required MinA for bed mobility and contact guard for STS to maintain sternal precautions. Was able to transfer to and from the [facility] w/o fault. Left in chair w/ call bell and personal belongings in reach. All needs met. Husband in room.   [name] Mobility Specialist  Please contact vis Secure Chat or  Rehab Office [phone]

## 2024-03-04 NOTE — Progress Notes (Addendum)
      [address]       [place] [address]             [phone]      25 Days Post-Op Procedure(s) (LRB): MITRAL VALVE REPAIR USING A SIMULUS SEMI-RIGID ANNULOPLASTY BAND (N/A) MAZE PROCEDURE (N/A) CLIPPING, LEFT ATRIAL APPENDAGE USING A MEDTRONIC CLIP (N/A) ECHOCARDIOGRAM, TRANSESOPHAGEAL (N/A)  Subjective:  Paitent sitting up in chair.  Feels like she needs to move bowels and is unable, requesting something to help.  Patient now states she would like to go to SNF as she feels her husband will be unable to provide care.Aaron Aas She would like to go to [facility] if possible  Objective: Vital signs in last 24 hours: Temp:  [97.9 F (36.6 C)-98.9 F (37.2 C)] 98.2 F (36.8 C) (05/04 0732) Pulse Rate:  [71-81] 71 (05/04 0328) Cardiac Rhythm: Normal sinus rhythm (05/03 1900) Resp:  [18-22] 18 (05/04 0328) BP: (94-120)/(47-56) 96/47 (05/04 0328) SpO2:  [96 %-97 %] 96 % (05/04 0328) Weight:  [68.8 kg] 68.8 kg (05/04 0500)  Hemodynamic parameters for last 24 hours: CVP:  [5 mmHg-9 mmHg] 5 mmHg  Intake/Output from previous day: 05/03 0701 - 05/04 0700 In: 800 [P.O.:800] Out: 1450 [Urine:1450]  General appearance: alert, cooperative, and no distress Heart: regular rate and rhythm Lungs: diminished breath sounds bibasilar Abdomen: soft, non-tender; bowel sounds normal; no masses,  no organomegaly Extremities: edema + non pitting Wound: clean and dry  Lab Results: Recent Labs    03/02/24 0425  WBC 8.5  HGB 8.2*  HCT 25.3*  PLT 187   BMET:  Recent Labs    03/03/24 0450 03/04/24 0529  NA 132* 132*  K 4.1 4.0  CL 97* 98  CO2 25 25  GLUCOSE 100* 91  BUN 29* 27*  CREATININE 1.56* 1.64*  CALCIUM  8.2* 7.8*    PT/INR: No results for input(s): "LABPROT", "INR" in the last 72 hours. ABG    Component Value Date/Time   PHART 7.367 02/08/2024 1849   HCO3 23.9 02/08/2024 1849   TCO2 25 02/08/2024 1849   ACIDBASEDEF 2.0 02/08/2024 1849    O2SAT 82.9 02/28/2024 0447   CBG (last 3)  No results for input(s): "GLUCAP" in the last 72 hours.  Assessment/Plan: S/P Procedure(s) (LRB): MITRAL VALVE REPAIR USING A SIMULUS SEMI-RIGID ANNULOPLASTY BAND (N/A) MAZE PROCEDURE (N/A) CLIPPING, LEFT ATRIAL APPENDAGE USING A MEDTRONIC CLIP (N/A) ECHOCARDIOGRAM, TRANSESOPHAGEAL (N/A)  CV-PAF, failed cardioversion, maintaining NSR- on Amiodarone  and Eliquis  Pulm- off oxygen, trace right pleural effusion, continue IS Acute Systolic Heart Failure, non-ischemic cardiomyopathy- AHF adjusting medications as able.. currently on Spironolactone , Farxiga   Renal- creatinine bumped to 1.64, she needs diuresis, however creatinine tenuous with dosing. Will discuss repeat Demadex  with Dr. Sherene Dilling Hypervolemic Hyponatremia- improved after Tolvaptan , Nephrology has signed off Deconditioning- PT/OT now recommending SNF placement... patient would prefer [facility] if possible Dispo- patient stable, not ready for discharge continue current care.. I have reached out to [facility] for SNF placement   LOS: 25 days   Gates Kasal, PA-C 03/04/2024   Chart reviewed, patient examined, agree with above.  Wt is at preop but she has LE edema. Will resume Demedex at 20 mg daily for now. Working on SNF bed.

## 2024-03-05 LAB — BASIC METABOLIC PANEL WITH GFR
Anion gap: 9 (ref 5–15)
BUN: 24 mg/dL — ABNORMAL HIGH (ref 8–23)
CO2: 24 mmol/L (ref 22–32)
Calcium: 7.7 mg/dL — ABNORMAL LOW (ref 8.9–10.3)
Chloride: 99 mmol/L (ref 98–111)
Creatinine, Ser: 1.61 mg/dL — ABNORMAL HIGH (ref 0.44–1.00)
GFR, Estimated: 32 mL/min — ABNORMAL LOW (ref >60)
Glucose, Bld: 95 mg/dL (ref 70–99)
Potassium: 3.8 mmol/L (ref 3.5–5.1)
Sodium: 132 mmol/L — ABNORMAL LOW (ref 135–145)

## 2024-03-05 MED ORDER — POTASSIUM CHLORIDE CRYS ER 20 MEQ PO TBCR
40.0000 meq | EXTENDED_RELEASE_TABLET | Freq: Once | ORAL | Status: AC
  Administered 2024-03-05: 40 meq via ORAL
  Filled 2024-03-05: qty 2

## 2024-03-05 MED ORDER — FUROSEMIDE 10 MG/ML IJ SOLN: 80.0000 mg | Freq: Two times a day (BID) | INTRAMUSCULAR | Status: DC

## 2024-03-05 MED ORDER — FUROSEMIDE 10 MG/ML IJ SOLN
80.0000 mg | Freq: Two times a day (BID) | INTRAMUSCULAR | Status: AC
  Administered 2024-03-05 – 2024-03-09 (×9): 80 mg via INTRAVENOUS
  Filled 2024-03-05 (×10): qty 8

## 2024-03-05 NOTE — Plan of Care (Signed)

## 2024-03-05 NOTE — TOC Progression Note (Addendum)
 Transition of Care [facility]) - Progression Note    Patient Details  Name: [name] MRN: [id number] Date of Birth: 08-21-1941  Transition [facility]) CM/SW Contact  Ernst Heap Phone Number: [phone] 03/05/2024, 9:46 AM  Clinical Narrative:  HF CSW met with patient and husband at bedside. CSW presented accepted bed offers. Patient and husband did not appear to be interested in bed offers list. Patient and husband inquired about [facility]. CSW stated the offer was pending and will call and inquire. Patient stated that she is only open to offers in the [place] area. Will continue to follow and present additional offers if some come up. Will start insurance auth when patient is closer to being medically ready for dc.   11:58 AM- CSW reached out to [place] at [facility] to inquire about the patients pending offers. [place] stated that she will check and see.                   Skilled Nursing Rehab Facilities-   [email or web address]  ?  ?  Ratings out of 5 stars (5 the highest)  ?   Name  Address   Phone #  Quality Care  Staffing  Health Inspection  Overall   [facility]  [address], [place]  [phone]  5  1  4  4    [facility]  [address] [facility]  [phone]  4  2  2  2    [facility]  [address], [place]  [phone]  5  1  2  2    [facility]  [address], [place]  [phone]  2  1  3  2    [facility]  [address] [place], [place]  [phone]  3  3  2  2    [facility]  [address] [place]  [phone]  3  1  1  1        Expected Discharge Plan: Skilled Nursing Facility Barriers to Discharge: Continued Medical Work up  Expected Discharge Plan and Services In-house Referral: Clinical Social Work Discharge Planning Services: CM Consult Post Acute Care Choice: Home Health Living arrangements for the past 2 months: Single Family Home                            HH Arranged: RN, PT, OT HH Agency: [facility] Date [facility] Agency Contacted: 02/27/24 Time HH Agency Contacted: 1339 Representative spoke with at [facility] Agency: [name]   Social Determinants of Health (SDOH) Interventions SDOH Screenings   Food Insecurity: No Food Insecurity (02/08/2024)  Housing: Low Risk  (02/08/2024)  Transportation Needs: No Transportation Needs (02/08/2024)  Utilities: Not At Risk (02/08/2024)  Depression (PHQ2-9): Low Risk  (12/20/2023)  Social Connections: Socially Integrated (02/09/2024)  Tobacco Use: Low Risk  (02/08/2024)    Readmission Risk Interventions     No data to display

## 2024-03-05 NOTE — Progress Notes (Signed)
 Mobility Specialist Progress Note:    03/05/24 0917  Mobility  Activity Ambulated with assistance in room  Level of Assistance Minimal assist, patient does 75% or more  Assistive Device Front wheel walker  Distance Ambulated (ft) 20 ft  RUE Weight Bearing Per Provider Order NWB  LUE Weight Bearing Per Provider Order NWB  Activity Response Tolerated well  Mobility Referral Yes  Mobility visit 1 Mobility  Mobility Specialist Start Time (ACUTE ONLY) 0912  Mobility Specialist Stop Time (ACUTE ONLY) [id number]  Mobility Specialist Time Calculation (min) (ACUTE ONLY) 5 min   Pt received in chair, agreeable to mobility session. Ambulated from recliner to chair by sink. Husband in room. Tolerated well, MinA to stand with momentum and CGA during ambulation. Left pt in chair, call bell in reach. Nursing notified.    Annaya Bangert Mobility Specialist Please contact via Special educational needs teacher or  Rehab office at [phone]

## 2024-03-05 NOTE — Progress Notes (Signed)
 Mobility Specialist Progress Note:   03/05/24 1043  Mobility  Activity Ambulated with assistance in room;Ambulated with assistance in hallway  Level of Assistance Minimal assist, patient does 75% or more  Assistive Device Front wheel walker  Distance Ambulated (ft) 140 ft  RUE Weight Bearing Per Provider Order NWB  LUE Weight Bearing Per Provider Order NWB  Activity Response Tolerated well  Mobility Referral Yes  Mobility visit 1 Mobility  Mobility Specialist Start Time (ACUTE ONLY) 1043  Mobility Specialist Stop Time (ACUTE ONLY) 1053  Mobility Specialist Time Calculation (min) (ACUTE ONLY) 10 min   Pt received back in chair, bath completed. Eager to ambulate in room and hallway. Required MinA to stand, MinG throughout remainder of session. Tolerated well, c/o fatigue near EOS, resulting in returning back to room. Sitting up comfortably in chair, husband at bedside. All needs met, call bell in reach. HR 94 bpm, post session.  Pamela Maddy Mobility Specialist Please contact via Special educational needs teacher or  Rehab office at [phone]

## 2024-03-05 NOTE — Progress Notes (Signed)
      [address]       [place] [address]             [phone]      26 Days Post-Op Procedure(s) (LRB): MITRAL VALVE REPAIR USING A SIMULUS SEMI-RIGID ANNULOPLASTY BAND (N/A) MAZE PROCEDURE (N/A) CLIPPING, LEFT ATRIAL APPENDAGE USING A MEDTRONIC CLIP (N/A) ECHOCARDIOGRAM, TRANSESOPHAGEAL (N/A)  Subjective:  Patient sitting up in chair, overall she feels pretty good.  States she was able to get up and stand a good bit with the walker.  She is hoping to walk today.  Objective: Vital signs in last 24 hours: Temp:  [98 F (36.7 C)-98.4 F (36.9 C)] 98.4 F (36.9 C) (05/05 0509) Pulse Rate:  [78-81] 81 (05/05 0509) Cardiac Rhythm: Normal sinus rhythm (05/04 1900) Resp:  [16-20] 18 (05/05 0509) BP: (89-105)/(51-59) 89/51 (05/05 0509) SpO2:  [95 %-100 %] 96 % (05/05 0509) Weight:  [68.8 kg] 68.8 kg (05/05 0454)  Hemodynamic parameters for last 24 hours: CVP:  [4 mmHg-17 mmHg] 17 mmHg  Intake/Output from previous day: 05/04 0701 - 05/05 0700 In: 3080 [P.O.:3080] Out: 700 [Urine:700]  General appearance: alert, cooperative, and no distress Heart: regular rate and rhythm Lungs: diminished breath sounds bibasilar Abdomen: soft, non-tender; bowel sounds normal; no masses,  no organomegaly Extremities: edema mild pitting Wound: clean and dry  Lab Results: No results for input(s): "WBC", "HGB", "HCT", "PLT" in the last 72 hours. BMET:  Recent Labs    03/04/24 0529 03/05/24 0525  NA 132* 132*  K 4.0 3.8  CL 98 99  CO2 25 24  GLUCOSE 91 95  BUN 27* 24*  CREATININE 1.64* 1.61*  CALCIUM  7.8* 7.7*    PT/INR: No results for input(s): "LABPROT", "INR" in the last 72 hours. ABG    Component Value Date/Time   PHART 7.367 02/08/2024 1849   HCO3 23.9 02/08/2024 1849   TCO2 25 02/08/2024 1849   ACIDBASEDEF 2.0 02/08/2024 1849   O2SAT 82.9 02/28/2024 0447   CBG (last 3)  No results for input(s): "GLUCAP" in the last 72  hours.  Assessment/Plan: S/P Procedure(s) (LRB): MITRAL VALVE REPAIR USING A SIMULUS SEMI-RIGID ANNULOPLASTY BAND (N/A) MAZE PROCEDURE (N/A) CLIPPING, LEFT ATRIAL APPENDAGE USING A MEDTRONIC CLIP (N/A) ECHOCARDIOGRAM, TRANSESOPHAGEAL (N/A)  CV- PAF, maintaining NSR, BP is low- on Amiodarone , Eliquis  Pulm- off oxygen.. continue IS Acute Systolic Heart Failure non-ischemic cardiomyopathy- AHF, adjusting medications as able... on Spironolactone , Farxiga ,  Renal- creatinine remained stable, at 1.61, continue Demadex  at 20 mg Hypervolemic Hyponatremia- improved after Tolvaptan , Nephrology has signed off Deconditioning- PT/OT recommending SNF, patient prefers [facility] if possible.Aaron Aas DIspo- patient making progress, hopefully ready for d/c in the next few days   LOS: 26 days   Gates Kasal, PA-C 03/05/2024

## 2024-03-05 NOTE — Progress Notes (Signed)
 Orthopedic Tech Progress Note Patient Details:  [name] [birth year] [id number]  Ortho Devices Type of Ortho Device: Ace wrap, Unna boot Ortho Device/Splint Location: BLE Ortho Device/Splint Interventions: Ordered, Application, Adjustment   Post Interventions Patient Tolerated: Well Instructions Provided: Care of device  Kermitt Pedlar 03/05/2024, 7:16 PM

## 2024-03-05 NOTE — Progress Notes (Signed)
 Advanced Heart Failure Rounding Note  Cardiologist: [name], MD  Chief Complaint: acute systolic heart failure    Patient Profile:    [name] is a 83 y.o. female with HTN and PAF. Admitted with SOB> found to have severe MR. Now s/p MVR, MAZE and LAA clipping. AHF team consulted for post-operative acute systolic heart failure.    Subjective:    Sitting up in bed. No current resting dyspnea but w/ 2+ b/l LEE on exam, worse than yesterday per pt report.   Scr relatively stable, 1.6 today K 3.8   She is maintaining NSR. HR 70s.    Objective:   Weight Range: 68.8 kg Body mass index is 26.04 kg/m.   Vital Signs:   Temp:  [97.8 F (36.6 C)-98.4 F (36.9 C)] 97.9 F (36.6 C) (05/05 0902) Pulse Rate:  [77-81] 81 (05/05 0902) Resp:  [16-20] 20 (05/05 0902) BP: (89-106)/(51-59) 106/53 (05/05 0902) SpO2:  [92 %-100 %] 92 % (05/05 0902) Weight:  [68.8 kg] 68.8 kg (05/05 0454) Last BM Date : 03/04/24  Weight change: Filed Weights   03/03/24 0625 03/04/24 0500 03/05/24 0454  Weight: 68.6 kg 68.8 kg 68.8 kg    Intake/Output:   Intake/Output Summary (Last 24 hours) at 03/05/2024 1148 Last data filed at 03/05/2024 0300 Gross per 24 hour  Intake 2880 ml  Output 600 ml  Net 2280 ml      Physical Exam   General:  elderly, sitting up in chair. No respiratory difficulty  HEENT: normal Neck: supple. JVD 12 cm. Carotids 2+ bilat; no bruits. No lymphadenopathy or thyromegaly appreciated. Cor: PMI nondisplaced. Regular rate & rhythm. No rubs, gallops or murmurs. Lungs: clear Abdomen: soft, nontender, nondistended. No hepatosplenomegaly. No bruits or masses. Good bowel sounds. Extremities: no cyanosis, clubbing, rash, 2+ b/l LE pitting edema + RUE PICC Neuro: alert & oriented x 3, cranial nerves grossly intact. moves all 4 extremities w/o difficulty. Affect pleasant.   Telemetry   NSR 70s, personally reviewed   EKG    N/A   Labs    CBC No results for  input(s): "WBC", "NEUTROABS", "HGB", "HCT", "MCV", "PLT" in the last 72 hours. Basic Metabolic Panel Recent Labs    78/29/56 0529 03/05/24 0525  NA 132* 132*  K 4.0 3.8  CL 98 99  CO2 25 24  GLUCOSE 91 95  BUN 27* 24*  CREATININE 1.64* 1.61*  CALCIUM  7.8* 7.7*   Liver Function Tests No results for input(s): "AST", "ALT", "ALKPHOS", "BILITOT", "PROT", "ALBUMIN " in the last 72 hours. No results for input(s): "LIPASE", "AMYLASE" in the last 72 hours. Cardiac Enzymes No results for input(s): "CKTOTAL", "CKMB", "CKMBINDEX", "TROPONINI" in the last 72 hours.  BNP: BNP (last 3 results) Recent Labs    02/23/24 0430 02/24/24 0425 02/25/24 0436  BNP 753.5* 516.8* 420.0*    ProBNP (last 3 results) No results for input(s): "PROBNP" in the last 8760 hours.   D-Dimer No results for input(s): "DDIMER" in the last 72 hours. Hemoglobin A1C No results for input(s): "HGBA1C" in the last 72 hours. Fasting Lipid Panel No results for input(s): "CHOL", "HDL", "LDLCALC", "TRIG", "CHOLHDL", "LDLDIRECT" in the last 72 hours. Thyroid  Function Tests No results for input(s): "TSH", "T4TOTAL", "T3FREE", "THYROIDAB" in the last 72 hours.  Invalid input(s): "FREET3"  Other results:   Imaging    No results found.   Medications:     Scheduled Medications:  amiodarone   400 mg Oral BID   apixaban   2.5 mg Oral BID   atorvastatin   10 mg Oral Daily   Chlorhexidine  Gluconate Cloth  6 each Topical Daily   [place] [facility], Patient & Family Education   Does not apply Once   dapagliflozin  propanediol  10 mg Oral Daily   ezetimibe   10 mg Oral Daily   feeding supplement  237 mL Oral BID BM   lactulose  20 g Oral Once   magnesium  gluconate  500 mg Oral Daily   melatonin  3 mg Oral QHS   multivitamin with minerals  1 tablet Oral Daily   pantoprazole   40 mg Oral Daily   polyethylene glycol  17 g Oral Daily   sodium chloride  flush  3-10 mL Intravenous Q12H   spironolactone   25 mg  Oral Daily   torsemide   20 mg Oral Daily    Infusions:  promethazine  (PHENERGAN ) injection (IM or IVPB) Stopped (02/20/24 0000)    PRN Medications: lactulose, loperamide , ondansetron  (ZOFRAN ) IV, mouth rinse, oxyCODONE , phenol, promethazine  (PHENERGAN ) injection (IM or IVPB), sodium chloride  flush, traMADol , witch hazel-glycerin     Assessment/Plan   Acute systolic heart failure, NiCM - Intra-op TEE 02/08/24 with EF 55-60% and severe MR - [facility] 2/25 with no CAD. RA 8, PA 41/17 (14), Fick CO/CI 7.76/4.41, PAPi 3, LVEDP 11 - Echo 4/25 with EF 35-40%, LV with GHK, RV normal, LA mod dilated, RA severely dilated, trivial MR, mod TR - Suspect drop in EF 2/2 persistent AF RVR.  - Volume overloaded on exam. Give IV Lasix , 80 mg bid today  - Place Unna boots to help w/ LEE - CVP currently not working correctly. Have asked RN to trouble shoot.  - Continue Farxiga  10 mg daily - Continue spiro 25 mg daily - additional GDMT limited by soft BP    Atrial fibrillation with RVR - S/p Maze and LAA clipping 4/25 - Failed DCCV X 2 04/17 - In and out of Afib this admit. In NSR today  - Continue amiodarone  400 mg BID - Continue Eliquis  2.5 mg BID (reduced dose given age and SCr)   Severe MR s/p MVR - Intra-op TEE with severe MR 02/08/24 - Trivial MR on echo 02/14/24   4. AKI - baseline Cr normal - Cr up slightly, 1.2>1.35>1.41>1.57>1.6 today  - Diurese w/ IV Lasix  today, per above - place unna boots to help w/ 3rd spacing  - follow BMP    7. Hypokalemia - Resolved, K 3.8 today  - will give K supp w/ IV Lasix   - continue spiro     8. Hypervolemic hyponatremia - Nephrology consulted - Felt to be multifactorial, volume overload, component of SIADH - Nephrology added Ure-Na. Holding off on hypertonic saline as low as Na continues to improve   9. Left pleural effusion - S/p left thoracentesis on 04/28   10. Debility - Continue aggressive PT/OT    Length of Stay: [address], PA-C  03/05/2024, 11:48 AM  Advanced Heart Failure Team Pager [phone] (M-F; 7a - 5p)  Please contact [facility] for night-coverage after hours (5p -7a ) and weekends on [email or web address]

## 2024-03-05 NOTE — Progress Notes (Signed)
 Call made to ortho tech twice for unna boot placement ,replied they will l be in the room as soon as they can

## 2024-03-05 NOTE — Plan of Care (Signed)

## 2024-03-06 ENCOUNTER — Ambulatory Visit

## 2024-03-06 LAB — URINALYSIS, W/ REFLEX TO CULTURE (INFECTION SUSPECTED)
Bilirubin Urine: NEGATIVE
Glucose, UA: 150 mg/dL — AB
Ketones, ur: NEGATIVE mg/dL
Nitrite: NEGATIVE
Protein, ur: NEGATIVE mg/dL
Specific Gravity, Urine: 1.006 (ref 1.005–1.030)
WBC, UA: >50 WBC/hpf (ref 0–5)
pH: 7 (ref 5.0–8.0)

## 2024-03-06 LAB — BASIC METABOLIC PANEL WITH GFR
Anion gap: 8 (ref 5–15)
BUN: 21 mg/dL (ref 8–23)
CO2: 25 mmol/L (ref 22–32)
Calcium: 7.8 mg/dL — ABNORMAL LOW (ref 8.9–10.3)
Chloride: 99 mmol/L (ref 98–111)
Creatinine, Ser: 1.52 mg/dL — ABNORMAL HIGH (ref 0.44–1.00)
GFR, Estimated: 34 mL/min — ABNORMAL LOW (ref >60)
Glucose, Bld: 97 mg/dL (ref 70–99)
Potassium: 3.7 mmol/L (ref 3.5–5.1)
Sodium: 132 mmol/L — ABNORMAL LOW (ref 135–145)

## 2024-03-06 LAB — MAGNESIUM: Magnesium: 2 mg/dL (ref 1.7–2.4)

## 2024-03-06 MED ORDER — AMIODARONE HCL 200 MG PO TABS
200.0000 mg | ORAL_TABLET | Freq: Two times a day (BID) | ORAL | Status: DC
  Administered 2024-03-07 – 2024-03-10 (×7): 200 mg via ORAL
  Filled 2024-03-06 (×7): qty 1

## 2024-03-06 MED ORDER — POTASSIUM CHLORIDE 20 MEQ PO PACK
40.0000 meq | PACK | Freq: Two times a day (BID) | ORAL | Status: DC
  Administered 2024-03-06 – 2024-03-08 (×5): 40 meq via ORAL
  Filled 2024-03-06 (×5): qty 2

## 2024-03-06 MED ORDER — POTASSIUM CHLORIDE CRYS ER 20 MEQ PO TBCR
40.0000 meq | EXTENDED_RELEASE_TABLET | Freq: Two times a day (BID) | ORAL | Status: DC
  Administered 2024-03-06: 40 meq via ORAL
  Filled 2024-03-06: qty 2

## 2024-03-06 MED ORDER — ACETAZOLAMIDE 250 MG PO TABS
500.0000 mg | ORAL_TABLET | Freq: Once | ORAL | Status: AC
  Administered 2024-03-06: 500 mg via ORAL
  Filled 2024-03-06: qty 2

## 2024-03-06 MED ORDER — CALCIUM POLYCARBOPHIL 625 MG PO TABS
625.0000 mg | ORAL_TABLET | Freq: Every day | ORAL | Status: DC
Start: 2024-03-06 — End: 2024-03-07
  Administered 2024-03-06: 625 mg via ORAL
  Filled 2024-03-06 (×2): qty 1

## 2024-03-06 NOTE — Progress Notes (Signed)
 Advanced Heart Failure Rounding Note  Cardiologist: [name], MD  Chief Complaint: acute systolic heart failure    Patient Profile:    [name] is a 83 y.o. female with HTN and PAF. Admitted with SOB> found to have severe MR. Now s/p MVR, MAZE and LAA clipping. AHF team consulted for post-operative acute systolic heart failure.    Subjective:    1.9L in UOP yesterday. SCr improving 1.6>>1.5. Remains edematous but feels much better. No current resting dyspnea.   CVP currently not working correctly.    Objective:   Weight Range: 69.4 kg Body mass index is 26.26 kg/m.   Vital Signs:   Temp:  [97.9 F (36.6 C)-98.6 F (37 C)] 98.1 F (36.7 C) (05/06 0758) Pulse Rate:  [76-87] 78 (05/06 0758) Resp:  [17-20] 20 (05/06 0758) BP: (89-116)/(50-99) 95/69 (05/06 0758) SpO2:  [94 %-99 %] 97 % (05/06 0547) Weight:  [69.4 kg] 69.4 kg (05/06 0400) Last BM Date : 03/05/24  Weight change: Filed Weights   03/04/24 0500 03/05/24 0454 03/06/24 0400  Weight: 68.8 kg 68.8 kg 69.4 kg    Intake/Output:   Intake/Output Summary (Last 24 hours) at 03/06/2024 0910 Last data filed at 03/06/2024 0700 Gross per 24 hour  Intake 150 ml  Output 1725 ml  Net -1575 ml      Physical Exam    General:  Well appearing. No respiratory difficulty HEENT: normal Neck: supple. JVD 8-9 cm. Carotids 2+ bilat; no bruits. No lymphadenopathy or thyromegaly appreciated. Cor: PMI nondisplaced. Regular rate & rhythm. No rubs, gallops or murmurs. Lungs: clear Abdomen: soft, nontender, nondistended. No hepatosplenomegaly. No bruits or masses. Good bowel sounds. Extremities: no cyanosis, clubbing, rash, 2+ b/l LEE edema + unna boots  Neuro: alert & oriented x 3, cranial nerves grossly intact. moves all 4 extremities w/o difficulty. Affect pleasant.     Telemetry   NSR 70s, personally reviewed   EKG    N/A   Labs    CBC No results for input(s): "WBC", "NEUTROABS", "HGB", "HCT", "MCV",  "PLT" in the last 72 hours. Basic Metabolic Panel Recent Labs    16/10/96 0525 03/06/24 0516  NA 132* 132*  K 3.8 3.7  CL 99 99  CO2 24 25  GLUCOSE 95 97  BUN 24* 21  CREATININE 1.61* 1.52*  CALCIUM  7.7* 7.8*   Liver Function Tests No results for input(s): "AST", "ALT", "ALKPHOS", "BILITOT", "PROT", "ALBUMIN " in the last 72 hours. No results for input(s): "LIPASE", "AMYLASE" in the last 72 hours. Cardiac Enzymes No results for input(s): "CKTOTAL", "CKMB", "CKMBINDEX", "TROPONINI" in the last 72 hours.  BNP: BNP (last 3 results) Recent Labs    02/23/24 0430 02/24/24 0425 02/25/24 0436  BNP 753.5* 516.8* 420.0*    ProBNP (last 3 results) No results for input(s): "PROBNP" in the last 8760 hours.   D-Dimer No results for input(s): "DDIMER" in the last 72 hours. Hemoglobin A1C No results for input(s): "HGBA1C" in the last 72 hours. Fasting Lipid Panel No results for input(s): "CHOL", "HDL", "LDLCALC", "TRIG", "CHOLHDL", "LDLDIRECT" in the last 72 hours. Thyroid  Function Tests No results for input(s): "TSH", "T4TOTAL", "T3FREE", "THYROIDAB" in the last 72 hours.  Invalid input(s): "FREET3"  Other results:   Imaging    No results found.   Medications:     Scheduled Medications:  amiodarone   400 mg Oral BID   apixaban   2.5 mg Oral BID   atorvastatin   10 mg Oral Daily   Chlorhexidine   Gluconate Cloth  6 each Topical Daily   [place] [facility], Patient & Family Education   Does not apply Once   dapagliflozin  propanediol  10 mg Oral Daily   ezetimibe   10 mg Oral Daily   feeding supplement  237 mL Oral BID BM   furosemide   80 mg Intravenous BID   lactulose  20 g Oral Once   magnesium  gluconate  500 mg Oral Daily   melatonin  3 mg Oral QHS   multivitamin with minerals  1 tablet Oral Daily   pantoprazole   40 mg Oral Daily   polycarbophil  625 mg Oral Daily   polyethylene glycol  17 g Oral Daily   potassium chloride   40 mEq Oral BID   sodium  chloride flush  3-10 mL Intravenous Q12H   spironolactone   25 mg Oral Daily    Infusions:  promethazine  (PHENERGAN ) injection (IM or IVPB) Stopped (02/20/24 0000)    PRN Medications: lactulose, loperamide , ondansetron  (ZOFRAN ) IV, mouth rinse, oxyCODONE , phenol, promethazine  (PHENERGAN ) injection (IM or IVPB), sodium chloride  flush, traMADol , witch hazel-glycerin     Assessment/Plan   Acute systolic heart failure, NiCM - Intra-op TEE 02/08/24 with EF 55-60% and severe MR - [facility] 2/25 with no CAD. RA 8, PA 41/17 (14), Fick CO/CI 7.76/4.41, PAPi 3, LVEDP 11 - Echo 4/25 with EF 35-40%, LV with GHK, RV normal, LA mod dilated, RA severely dilated, trivial MR, mod TR - Suspect drop in EF 2/2 persistent AF RVR.  - C/w volume overload on exam  - Continue IV Lasix  80 mg bid + Diamox  500 mg x 1  - Continue UNNA boots  - CVP currently not working correctly. Have asked RN to trouble shoot.  - Continue Farxiga  10 mg daily - Continue spiro 25 mg daily - additional GDMT limited by soft BP    Atrial fibrillation with RVR - S/p Maze and LAA clipping 4/25 - Failed DCCV X 2 04/17 - In and out of Afib this admit. Maintaining NSR today  - Continue amiodarone , reduce to 200 bid  - Continue Eliquis  2.5 mg BID (reduced dose given age and SCr). Monitor SCr closely, if drops < 1.4 will need to increase to 5 mg bid    Severe MR s/p MVR - Intra-op TEE with severe MR 02/08/24 - Trivial MR on echo 02/14/24   4. AKI - baseline Cr normal - Cr up slightly, 1.2>1.35>1.41>1.57>1.6>1.5 today  - Continue IV Lasix  per above  - follow BMP   7. Hypokalemia - Resolved, K 3.7 today  - will give K supp w/ IV Lasix   - continue spiro     8. Hypervolemic hyponatremia - Felt to be multifactorial, volume overload, component of SIADH - Nephrology added Ure-Na. Holding off on hypertonic saline as low as Na continues to improve   9. Left pleural effusion - S/p left thoracentesis on 04/28   10. Debility - Continue  aggressive PT/OT    Length of Stay: [address] PA-C  03/06/2024, 9:10 AM  Advanced Heart Failure Team Pager [phone] (M-F; 7a - 5p)  Please contact [facility] for night-coverage after hours (5p -7a ) and weekends on [email or web address]

## 2024-03-06 NOTE — Progress Notes (Signed)
 CARDIAC REHAB PHASE I       Postop OHS education completed. PT/OT/mobility team for complex mobility needs. Referral for CRP2 sent to [facility]. Pending discharge to SNF. CRP1 will sigh off today.    Ronny Colas, RN BSN 03/06/2024 10:51 AM

## 2024-03-06 NOTE — TOC Progression Note (Signed)
 Transition of Care [facility]) - Progression Note    Patient Details  Name: [name] MRN: [id number] Date of Birth: 1941/09/22  Transition of Care [facility]) CM/SW Contact  Ernst Heap Phone Number: [phone] 03/06/2024, 11:38 AM  Clinical Narrative:  9:44 AM- HF CSW called to speak with patient about SNF updates. CSW will continue to make attempts to reach patient.   11:38 AM- HF CSW called to speak with patient about SNF updates. CSW will continue to make attempts to reach patient.   11:39 AM-  HF CSW called the patients husband who sitting at bedside. CSW updated that the patient and patients husband that she was accepted at their preferred SNF choice [facility]. Patients husband stated that he also managed to tour today and excited about the choice. Will start insurance auth when patient is closer to being medically ready for dc.   TOC will continue following.     Expected Discharge Plan: Skilled Nursing Facility Barriers to Discharge: Continued Medical Work up  Expected Discharge Plan and Services In-house Referral: Clinical Social Work Discharge Planning Services: CM Consult Post Acute Care Choice: Home Health Living arrangements for the past 2 months: Single Family Home                           HH Arranged: RN, PT, OT HH Agency: [facility] Date [facility] Agency Contacted: 02/27/24 Time HH Agency Contacted: 1339 Representative spoke with at [facility] Agency: [name]   Social Determinants of Health (SDOH) Interventions SDOH Screenings   Food Insecurity: No Food Insecurity (02/08/2024)  Housing: Low Risk  (02/08/2024)  Transportation Needs: No Transportation Needs (02/08/2024)  Utilities: Not At Risk (02/08/2024)  Depression (PHQ2-9): Low Risk  (12/20/2023)  Social Connections: Socially Integrated (02/09/2024)  Tobacco Use: Low Risk  (02/08/2024)    Readmission Risk Interventions     No data to display

## 2024-03-06 NOTE — Progress Notes (Signed)
 Physical Therapy Treatment Patient Details Name: [name] MRN: [id number] DOB: 1941/05/19 Today's Date: 03/06/2024   History of Present Illness 83 y.o. female presents to [facility] 02/08/24 for complex MVR and MAZE (via sternotomy). Post-op complicated by persistent a fib RVR and volume overload. Cardioversion x2 4/18 without resolution of afib.  4/21 returned to NSR. 4/9-4/23 ICU stay. 4/28 pt with L pleural effusion s/p L thoracentesis. PMHx: anemia, HTN, mitral valve prolapse, moderate mitral regurgitation, tricuspid regurgitation    PT Comments  Pt received sitting in chair on arrival, pleasant and agreeable to therapy session with continued progress toward acute goals. Pt demonstrated sit<>stand transfer from chair with minA to boost to stand, with no AD. Pt completed step pivot transfer chair>BSC with minA for safe landing and hand placement. Pt demonstrating gait this session with RW support and CGA for safety. Pt with good recall and implementation of sternal precautions throughout session. Educated pt on continued incentive spirometer use and time up out of bed importance with pt agreeable to sitting in chair at end of session. Patient will benefit from continued inpatient follow up therapy, <3 hours/day, will continue to follow acutely.      If plan is discharge home, recommend the following: A little help with walking and/or transfers;A little help with bathing/dressing/bathroom;Assistance with cooking/housework;Assist for transportation;Help with stairs or ramp for entrance   Can travel by private vehicle     Yes  Equipment Recommendations  BSC/3in1    Recommendations for Other Services       Precautions / Restrictions Precautions Precautions: Fall;Sternal Precaution Booklet Issued: Yes (comment) Recall of Precautions/Restrictions: Intact Precaution/Restrictions Comments: good recall and implementation of precautions throughout Restrictions Weight Bearing Restrictions Per Provider  Order: No Other Position/Activity Restrictions: sternal precautions     Mobility  Bed Mobility               General bed mobility comments: OOB in chair    Transfers Overall transfer level: Needs assistance Equipment used: None, Rolling walker (2 wheels) Transfers: Sit to/from Stand, Bed to chair/wheelchair/BSC Sit to Stand: Contact guard assist, Min assist   Step pivot transfers: Min assist, Contact guard assist       General transfer comment: minA to boost to stand on first sit<>stand transfer from chair, up to minA to step pivot for safe landing and hand placement on knees, CGA for saftey on second sit<>stand transfer from [facility]    Ambulation/Gait Ambulation/Gait assistance: Contact guard assist Gait Distance (Feet): 110 Feet Assistive device: Rolling walker (2 wheels) Gait Pattern/deviations: Step-through pattern, Decreased stride length Gait velocity: decr     General Gait Details: CGA for saftey   Stairs             Wheelchair Mobility     Tilt Bed    Modified Rankin (Stroke Patients Only)       Balance Overall balance assessment: Needs assistance Sitting-balance support: No upper extremity supported, Feet supported Sitting balance-Leahy Scale: Fair Sitting balance - Comments: in recliner/armless chair   Standing balance support: No upper extremity supported Standing balance-Leahy Scale: Fair Standing balance comment: able to statically stand for ~2 min at [facility] for pericare  with no AD and no LOB                            Communication Communication Communication: Impaired Factors Affecting Communication: Hearing impaired  Cognition Arousal: Alert Behavior During Therapy: WFL for tasks assessed/performed   PT -  Cognitive impairments: No apparent impairments                       PT - Cognition Comments: good recall of precautions Following commands: Intact      Cueing Cueing Techniques: Verbal cues  Exercises       General Comments General comments (skin integrity, edema, etc.): VSS on RA      Pertinent Vitals/Pain Pain Assessment Pain Assessment: No/denies pain Pain Intervention(s): Monitored during session    Home Living                          Prior Function            PT Goals (current goals can now be found in the care plan section) Acute Rehab PT Goals PT Goal Formulation: With patient/family Time For Goal Achievement: 03/08/24 Progress towards PT goals: Progressing toward goals    Frequency    Min 2X/week      PT Plan      Co-evaluation              AM-PAC PT "6 Clicks" Mobility   Outcome Measure  Help needed turning from your back to your side while in a flat bed without using bedrails?: A Little Help needed moving from lying on your back to sitting on the side of a flat bed without using bedrails?: A Little Help needed moving to and from a bed to a chair (including a wheelchair)?: A Little Help needed standing up from a chair using your arms (e.g., wheelchair or bedside chair)?: A Little Help needed to walk in hospital room?: A Little Help needed climbing 3-5 steps with a railing? : A Little 6 Click Score: 18    End of Session Equipment Utilized During Treatment: Gait belt Activity Tolerance: Patient tolerated treatment well Patient left: in chair;with call bell/phone within reach Nurse Communication: Mobility status PT Visit Diagnosis: Unsteadiness on feet (R26.81);Muscle weakness (generalized) (M62.81);Other abnormalities of gait and mobility (R26.89)     Time: 6295-2841 PT Time Calculation (min) (ACUTE ONLY): 19 min  Charges:    $Gait Training: 8-22 mins PT General Charges $$ ACUTE PT VISIT: 1 Visit                     L[place], Brasseur 03/06/2024, 12:26 PM

## 2024-03-06 NOTE — Progress Notes (Signed)
 Mobility Specialist Progress Note:   03/06/24 1015  Mobility  Activity Transferred to/from [facility]  Level of Assistance Minimal assist, patient does 75% or more  Assistive Device Front wheel walker  Distance Ambulated (ft) 4 ft  RUE Weight Bearing Per Provider Order NWB  LUE Weight Bearing Per Provider Order NWB  Activity Response Tolerated well  Mobility Referral Yes  Mobility visit 1 Mobility  Mobility Specialist Start Time (ACUTE ONLY) 1012  Mobility Specialist Stop Time (ACUTE ONLY) 1015  Mobility Specialist Time Calculation (min) (ACUTE ONLY) 3 min   Pt requested assistance from C>BSC. MinA required to stand, CGA with RW to pivot to [facility]. Tolerated well, asx throughout. Left pt on [facility] with call bell in reach, all needs met.   Levaughn Puccinelli Mobility Specialist Please contact via Special educational needs teacher or  Rehab office at [phone]

## 2024-03-06 NOTE — Progress Notes (Signed)
 Occupational Therapy Treatment Patient Details Name: [name] MRN: [id number] DOB: February 20, 1941 Today's Date: 03/06/2024   History of present illness 83 y.o. female presents to [facility] 02/08/24 for complex MVR and MAZE (via sternotomy). Post-op complicated by persistent a fib RVR and volume overload. Cardioversion x2 4/18 without resolution of afib.  4/21 returned to NSR. 4/9-4/23 ICU stay. 4/28 pt with L pleural effusion s/p L thoracentesis. PMHx: anemia, HTN, mitral valve prolapse, moderate mitral regurgitation, tricuspid regurgitation   OT comments  Pt progressing toward established OT goals. Pt eager to take sponge bath. Pt needing min cues to optimize ability to recall precautions and min cues to functionally implement during posterior pericare. Pt performing sponge bath with up to min A of note below knee not bathed secondary to placement of unna boots. Pt needing two rest breaks during sponge bathing secondary to DOE. Will continue to follow.       If plan is discharge home, recommend the following:  A little help with walking and/or transfers;A lot of help with bathing/dressing/bathroom;Assistance with cooking/housework;Assist for transportation;Help with stairs or ramp for entrance   Equipment Recommendations  BSC/3in1    Recommendations for Other Services      Precautions / Restrictions Precautions Precautions: Fall;Sternal Precaution Booklet Issued: Yes (comment) Recall of Precautions/Restrictions: Impaired Precaution/Restrictions Comments: minimal cues needed to adhere to precautions with sit/stand transfer Restrictions Other Position/Activity Restrictions: sternal precautions       Mobility Bed Mobility               General bed mobility comments: OOB in chair    Transfers Overall transfer level: Needs assistance Equipment used: None, Rolling walker (2 wheels) Transfers: Sit to/from Stand Sit to Stand: Contact guard assist, Min assist           General  transfer comment: CGA-min A for anterior weight shift     Balance Overall balance assessment: Needs assistance Sitting-balance support: No upper extremity supported, Feet supported Sitting balance-Leahy Scale: Fair Sitting balance - Comments: in recliner/armless chair   Standing balance support: No upper extremity supported Standing balance-Leahy Scale: Fair Standing balance comment: able to stand statically with no AD for short periods                           ADL either performed or assessed with clinical judgement   ADL Overall ADL's : Needs assistance/impaired     Grooming: Wash/dry face;Contact guard assist;Standing   Upper Body Bathing: Set up;Supervision/ safety;Sitting   Lower Body Bathing: Set up;Supervison/ safety;Sitting/lateral leans   Upper Body Dressing : Minimal assistance;Sitting Upper Body Dressing Details (indicate cue type and reason): to don gown     Toilet Transfer: Contact guard assist;Ambulation;BSC/3in1;Rolling walker (2 wheels)           Functional mobility during ADLs: Contact guard assist;Rolling walker (2 wheels)      Extremity/Trunk Assessment Upper Extremity Assessment Upper Extremity Assessment: Overall WFL for tasks assessed   Lower Extremity Assessment Lower Extremity Assessment: Defer to PT evaluation        Vision       Perception Perception Perception: Not tested   Praxis Praxis Praxis: Not tested   Communication Communication Communication: Impaired Factors Affecting Communication: Hearing impaired   Cognition Arousal: Alert Behavior During Therapy: WFL for tasks assessed/performed Cognition: No apparent impairments  Following commands: Intact        Cueing   Cueing Techniques: Verbal cues  Exercises      Shoulder Instructions       General Comments VSS    Pertinent Vitals/ Pain       Pain Assessment Pain Assessment: No/denies pain  Home Living                                           Prior Functioning/Environment              Frequency  Min 2X/week        Progress Toward Goals  OT Goals(current goals can now be found in the care plan section)  Progress towards OT goals: Progressing toward goals  Acute Rehab OT Goals Patient Stated Goal: take a bath OT Goal Formulation: With patient Time For Goal Achievement: 03/09/24 Potential to Achieve Goals: Good ADL Goals Pt Will Perform Grooming: with supervision;standing Pt Will Perform Lower Body Bathing: with contact guard assist;sitting/lateral leans;sit to/from stand Pt Will Perform Upper Body Dressing: with supervision;sitting Pt Will Perform Lower Body Dressing: with contact guard assist;sitting/lateral leans;sit to/from stand Pt Will Transfer to Toilet: with supervision;ambulating;regular height toilet Pt Will Perform Toileting - Clothing Manipulation and hygiene: with supervision;sitting/lateral leans;sit to/from stand Additional ADL Goal #1: Patient will demonstrate ability to Independently state 4 energy conservation strategies to increase safety and independence with functional tasks with handout provided.  Plan      Co-evaluation                 AM-PAC OT "6 Clicks" Daily Activity     Outcome Measure   Help from another person eating meals?: A Little Help from another person taking care of personal grooming?: A Little Help from another person toileting, which includes using toliet, bedpan, or urinal?: A Lot Help from another person bathing (including washing, rinsing, drying)?: A Lot Help from another person to put on and taking off regular upper body clothing?: A Little Help from another person to put on and taking off regular lower body clothing?: A Lot 6 Click Score: 15    End of Session Equipment Utilized During Treatment: Rolling walker (2 wheels)  OT Visit Diagnosis: Other abnormalities of gait and mobility  (R26.89);Muscle weakness (generalized) (M62.81);Other (comment)   Activity Tolerance Patient tolerated treatment well   Patient Left in chair;with call bell/phone within reach   Nurse Communication Mobility status        Time: 0820-0857 OT Time Calculation (min): 37 min  Charges: OT General Charges $OT Visit: 1 Visit OT Treatments $Self Care/Home Management : 23-37 mins  Karilyn Ouch, OTR/L [facility] Acute Rehabilitation Office: [phone]   Emery Hans 03/06/2024, 10:44 AM

## 2024-03-06 NOTE — Progress Notes (Addendum)
 27 Days Post-Op Procedure(s) (LRB): MITRAL VALVE REPAIR USING A SIMULUS SEMI-RIGID ANNULOPLASTY BAND (N/A) MAZE PROCEDURE (N/A) CLIPPING, LEFT ATRIAL APPENDAGE USING A MEDTRONIC CLIP (N/A) ECHOCARDIOGRAM, TRANSESOPHAGEAL (N/A) Subjective: [name] is awake, alert and oriented sitting in bed. She overall doing well. She states she had a bathrrom accident  and suspects it was the miralax  she was given. She is tolerating her diet well with no n/v. She has no complains of SOB or chest pain    Objective: Vital signs in last 24 hours: Temp:  [97.8 F (36.6 C)-98.6 F (37 C)] 98.1 F (36.7 C) (05/06 0758) Pulse Rate:  [76-87] 78 (05/06 0758) Cardiac Rhythm: Normal sinus rhythm (05/05 2100) Resp:  [17-20] 20 (05/06 0758) BP: (89-116)/(50-99) 95/69 (05/06 0758) SpO2:  [92 %-99 %] 97 % (05/06 0547) Weight:  [69.4 kg] 69.4 kg (05/06 0400)  Hemodynamic parameters for last 24 hours: CVP:  [0 mmHg-9 mmHg] 8 mmHg  Intake/Output from previous day: 05/05 0701 - 05/06 0700 In: 350 [P.O.:350] Out: 1875 [Urine:1875] Intake/Output this shift: No intake/output data recorded.  General appearance: alert, cooperative, and no distress Neurologic: intact Heart: regular rate and rhythm Lungs: diminished breath sounds bilaterally  Abdomen: soft, non-tender; bowel sounds normal; no masses,  no organomegaly Extremities: edema with mild pitting  Wound: clean and dry   Lab Results: No results for input(s): "WBC", "HGB", "HCT", "PLT" in the last 72 hours. BMET:  Recent Labs    03/05/24 0525 03/06/24 0516  NA 132* 132*  K 3.8 3.7  CL 99 99  CO2 24 25  GLUCOSE 95 97  BUN 24* 21  CREATININE 1.61* 1.52*  CALCIUM  7.7* 7.8*    PT/INR: No results for input(s): "LABPROT", "INR" in the last 72 hours. ABG    Component Value Date/Time   PHART 7.367 02/08/2024 1849   HCO3 23.9 02/08/2024 1849   TCO2 25 02/08/2024 1849   ACIDBASEDEF 2.0 02/08/2024 1849   O2SAT 82.9 02/28/2024 0447   CBG  (last 3)  No results for input(s): "GLUCAP" in the last 72 hours.  Assessment/Plan: S/P Procedure(s) (LRB): MITRAL VALVE REPAIR USING A SIMULUS SEMI-RIGID ANNULOPLASTY BAND (N/A) MAZE PROCEDURE (N/A) CLIPPING, LEFT ATRIAL APPENDAGE USING A MEDTRONIC CLIP (N/A) ECHOCARDIOGRAM, TRANSESOPHAGEAL (N/A)    Cardio: NSR, her BP is on the soft this am 95/69. Her HR is controlled on amiodarone . Will do a trial of reducing her amiodarone  dose to 200mg  BID pending HF input.  Pulm: She is on room air. Continue to use IS and mobilize   GI: +BM, she is requesting for something that will improve her BM but not have an accident. Will start her on 625mg  fiber supplements and hold her miralax   Renal: Cr trending down from 1.61-1.52, her Na is low but stable. She is still volume overloaded. Continue lasix  and monitor Cr and electrolytes. Will also start her on K+ supplements since she in high dose lasix   MSK: LL edema. HF has her on unna boot to help with edema  Deconditioning:- PT/OT recommending SNF, patient prefers [facility] if possible.  Dispo: patient making progress, hopefully ready for d/c in the next few days      LOS: 27 days    Samir Halalou 03/06/2024  Agree with above documentation.  Overall she continues to make progress.  She needs diuresis.. night dose was held due to low blood pressure, however her blood pressure has been stable 90-100s.  ? Can we reduced Amiodarone  to 200 mg BID  as she has been on 400 mg BID since 4/24.  Una boots in place... SNF at discharge.. needs volume status optimization prior to being ready for SNF... AHF following   Erin Barrett, PA-C 8:25 AM 03/06/24   Chart reviewed, patient examined, agree with above.  AHF team is adjusting her meds. She is -1100 so far today. Has unna boots on but sitting in a chair most of the day with her legs hanging down. She walked to the bathroom today with assistance and stood at the sink to clean up. She is hopeful to  get to [facility] by this weekend.

## 2024-03-07 LAB — BASIC METABOLIC PANEL WITH GFR
Anion gap: 7 (ref 5–15)
BUN: 18 mg/dL (ref 8–23)
CO2: 25 mmol/L (ref 22–32)
Calcium: 7.8 mg/dL — ABNORMAL LOW (ref 8.9–10.3)
Chloride: 100 mmol/L (ref 98–111)
Creatinine, Ser: 1.67 mg/dL — ABNORMAL HIGH (ref 0.44–1.00)
GFR, Estimated: 31 mL/min — ABNORMAL LOW (ref >60)
Glucose, Bld: 103 mg/dL — ABNORMAL HIGH (ref 70–99)
Potassium: 4 mmol/L (ref 3.5–5.1)
Sodium: 132 mmol/L — ABNORMAL LOW (ref 135–145)

## 2024-03-07 LAB — MAGNESIUM: Magnesium: 1.9 mg/dL (ref 1.7–2.4)

## 2024-03-07 MED ORDER — PSYLLIUM 95 % PO PACK
1.0000 | PACK | Freq: Every day | ORAL | Status: DC
  Administered 2024-03-07 – 2024-03-09 (×3): 1 via ORAL
  Filled 2024-03-07 (×5): qty 1

## 2024-03-07 MED ORDER — MAGNESIUM SULFATE 2 GM/50ML IV SOLN
2.0000 g | Freq: Once | INTRAVENOUS | Status: AC
  Administered 2024-03-07: 2 g via INTRAVENOUS
  Filled 2024-03-07 (×2): qty 50

## 2024-03-07 NOTE — Progress Notes (Addendum)
 [address]       [place] [address]             [phone]      28 Days Post-Op Procedure(s) (LRB): MITRAL VALVE REPAIR USING A SIMULUS SEMI-RIGID ANNULOPLASTY BAND (N/A) MAZE PROCEDURE (N/A) CLIPPING, LEFT ATRIAL APPENDAGE USING A MEDTRONIC CLIP (N/A) ECHOCARDIOGRAM, TRANSESOPHAGEAL (N/A)  Subjective:  Patient sitting up in chair.  She thinks she continues to do a little better.  She continues to have diarrhea.  She requested fiber tablet yesterday and asks if this can be decreased.  She is working with PT/OT  Objective: Vital signs in last 24 hours: Temp:  [97.8 F (36.6 C)-98.7 F (37.1 C)] 98.7 F (37.1 C) (05/07 0313) Pulse Rate:  [72-82] 77 (05/07 0313) Cardiac Rhythm: Normal sinus rhythm;Bundle branch block (05/06 1900) Resp:  [16-20] 20 (05/07 0313) BP: (95-110)/(47-69) 105/55 (05/07 0313) SpO2:  [92 %-99 %] 95 % (05/07 0313) Weight:  [68.8 kg] 68.8 kg (05/07 0313)  Hemodynamic parameters for last 24 hours: CVP:  [6 mmHg-10 mmHg] 10 mmHg  Intake/Output from previous day: 05/06 0701 - 05/07 0700 In: 260 [P.O.:260] Out: 1600 [Urine:1600]  General appearance: alert, cooperative, and no distress Heart: regular rate and rhythm Lungs: diminished breath sounds bibasilar Abdomen: soft, non-tender; bowel sounds normal; no masses,  no organomegaly Extremities: edema soft, improving Wound: clean and dry, healing well w/o evidence of infection  Lab Results: No results for input(s): "WBC", "HGB", "HCT", "PLT" in the last 72 hours. BMET:  Recent Labs    03/06/24 0516 03/07/24 0530  NA 132* 132*  K 3.7 4.0  CL 99 100  CO2 25 25  GLUCOSE 97 103*  BUN 21 18  CREATININE 1.52* 1.67*  CALCIUM  7.8* 7.8*    PT/INR: No results for input(s): "LABPROT", "INR" in the last 72 hours. ABG    Component Value Date/Time   PHART 7.367 02/08/2024 1849   HCO3 23.9 02/08/2024 1849   TCO2 25 02/08/2024 1849   ACIDBASEDEF 2.0 02/08/2024 1849    O2SAT 82.9 02/28/2024 0447   CBG (last 3)  No results for input(s): "GLUCAP" in the last 72 hours.  Assessment/Plan: S/P Procedure(s) (LRB): MITRAL VALVE REPAIR USING A SIMULUS SEMI-RIGID ANNULOPLASTY BAND (N/A) MAZE PROCEDURE (N/A) CLIPPING, LEFT ATRIAL APPENDAGE USING A MEDTRONIC CLIP (N/A) ECHOCARDIOGRAM, TRANSESOPHAGEAL (N/A)  CV- Maintaining NSR, Amiodarone  decreased to 200 mg BID, Eliquis  for previous A.Fib Pulm- not requiring oxygen, she denies shortness of breath.. continue IS Acute Systolic Heart Failure, Non-Ischemic Cardiomyopathy- on Farxiga , Aldactone  Renal- creatinine up from 1.5 to 1.67, due to aggressive diuretic usage, weight remains stable.. IV Lasix  80 mg BID, Diamox  500 mg BID.Aaron Aasdosing per AHF Hypervolemic Hyponatremia- remains stable GI- persistent diarrhea post lactulose, requested fiber supplement yesterday, asking if this can be decreased GU- nurse contacted via urine being foul smelling, amber color... UA + blood, leukocytes.. culture triggered, patient denies urinary symptoms will monitor for now.. if culture shows infection will add ABX Deconditioning- working with PT/OT.. has bed offer at [facility].. they will hold until Friday  Dispo- patient stable, volume status remains biggest issues.Aaron Aas diuretics per AHF, however creatinine tup to 1.67 today, will decrease Fiber supplement... monitor urine culture   LOS: 28 days   Gates Kasal, PA-C 03/07/2024   Chart reviewed, patient examined, agree with above.  She continues to diurese and legs looks less swollen. She ambulated to nursing station and says she would like to  ambulate three times per day if the mobility team could help her do that tomorrow. Ideally I would like to get her to [facility] on Friday since they are holding a bed for her till then and both her and husband are looking forward to that.

## 2024-03-07 NOTE — Progress Notes (Addendum)
 Advanced Heart Failure Rounding Note  Cardiologist: [name], MD  Chief Complaint: Afib Patient Profile:    [name] is a 83 y.o. female with HTN and PAF. Admitted with SOB> found to have severe MR. Now s/p MVR, MAZE and LAA clipping. AHF team consulted for post-operative acute systolic heart failure.   Subjective:    1.6L in UOP. Weight down 2 lbs.  On Lasix  80 mg IV bid + diamox  500 mg sCr 1.61>1.52>1.67  Feeling well this morning. Sitting up in the chair, just finished bathing. No SOB, reports that her legs feel less heavy.  Objective:    Weight Range: 68.8 kg Body mass index is 26.04 kg/m.   Vital Signs:   Temp:  [97.8 F (36.6 C)-98.7 F (37.1 C)] 97.9 F (36.6 C) (05/07 0749) Pulse Rate:  [72-82] 75 (05/07 0749) Resp:  [16-20] 19 (05/07 0749) BP: (101-110)/(47-62) 103/62 (05/07 0749) SpO2:  [95 %-99 %] 99 % (05/07 0749) Weight:  [68.8 kg] 68.8 kg (05/07 0313) Last BM Date : 03/06/24  Weight change: Filed Weights   03/05/24 0454 03/06/24 0400 03/07/24 0313  Weight: 68.8 kg 69.4 kg 68.8 kg   Intake/Output:  Intake/Output Summary (Last 24 hours) at 03/07/2024 0927 Last data filed at 03/07/2024 0320 Gross per 24 hour  Intake 260 ml  Output 1500 ml  Net -1240 ml    Physical Exam   CVP disconnected General: Well appearing. No distress on RA Cardiac: JVP ~7cm. S1 and S2 present. No murmurs or rub. Abdomen: Soft, non-tender, non-distended.  Extremities: Warm and dry.  1+ BLE edema. + unna boots Neuro: Alert and oriented x3. Affect pleasant. Moves all extremities without difficulty. Lines/Devices:  RUE PICC  Telemetry   SR in 70s with PACs (personally reviewed)  EKG    N/A   Labs    CBC No results for input(s): "WBC", "NEUTROABS", "HGB", "HCT", "MCV", "PLT" in the last 72 hours. Basic Metabolic Panel Recent Labs    82/95/62 0516 03/07/24 0530  NA 132* 132*  K 3.7 4.0  CL 99 100  CO2 25 25  GLUCOSE 97 103*  BUN 21 18  CREATININE 1.52*  1.67*  CALCIUM  7.8* 7.8*  MG 2.0 1.9   BNP (last 3 results) Recent Labs    02/23/24 0430 02/24/24 0425 02/25/24 0436  BNP 753.5* 516.8* 420.0*   Imaging   No results found.  Medications:    Scheduled Medications:  amiodarone   200 mg Oral BID   apixaban   2.5 mg Oral BID   atorvastatin   10 mg Oral Daily   Chlorhexidine  Gluconate Cloth  6 each Topical Daily   [facility], Patient & Family Education   Does not apply Once   dapagliflozin  propanediol  10 mg Oral Daily   ezetimibe   10 mg Oral Daily   feeding supplement  237 mL Oral BID BM   furosemide   80 mg Intravenous BID   magnesium  gluconate  500 mg Oral Daily   melatonin  3 mg Oral QHS   multivitamin with minerals  1 tablet Oral Daily   pantoprazole   40 mg Oral Daily   potassium chloride   40 mEq Oral BID   psyllium  1 packet Oral Daily   sodium chloride  flush  3-10 mL Intravenous Q12H   spironolactone   25 mg Oral Daily   Infusions:  promethazine  (PHENERGAN ) injection (IM or IVPB) Stopped (02/20/24 0000)   PRN Medications: loperamide , ondansetron  (ZOFRAN ) IV, mouth rinse, oxyCODONE , phenol, promethazine  (  PHENERGAN ) injection (IM or IVPB), sodium chloride  flush, traMADol , witch hazel-glycerin   Assessment/Plan   Acute systolic heart failure, NiCM - Intra-op TEE 02/08/24 with EF 55-60% and severe MR - [facility] 2/25 with no CAD. RA 8, PA 41/17 (14), Fick CO/CI 7.76/4.41, PAPi 3, LVEDP 11 - Echo 4/25 with EF 35-40%, LV with GHK, RV normal, LA mod dilated, RA severely dilated, trivial MR, mod TR - Suspect drop in EF 2/2 persistent AF RVR.  - Volume improving. CVP historically not reading. Have asked RN to re-attempt, if unable to obtain will get a ReDs reading.  - Continue one more day of Lasix  IV 80 mg bid.  - Continue Farxiga  10 mg daily - Continue spiro 25 mg daily - Continue UNNA boots  - additional GDMT limited by soft BP    Atrial fibrillation with RVR - S/p Maze and LAA clipping 4/25 - Failed DCCV X  2 4/17 - In/out of Afib this admit. Remains in SR on tele - Continue amiodarone  200 bid  - Continue Eliquis  2.5 mg bid (reduced dose given age and SCr). Monitor SCr closely, if drops < 1.4 will need to increase to 5 mg bid    Severe MR s/p MVR - Intra-op TEE with severe MR 02/08/24 - Trivial MR on echo 02/14/24   4. AKI - baseline Cr normal - Cr up slightly, 1.2>1.35>1.41>1.57>1.6>1.5>1.67 today  - diuresis as above  5. Hypokalemia - resolved - continue spiro     6. Hypervolemic hyponatremia - Felt to be multifactorial, volume overload, component of SIADH - Nephrology added Ure-Na. Holding off on hypertonic saline as low as Na continues to improve   7. Left pleural effusion - S/p left thoracentesis on 4/28   8. Debility - Continue aggressive PT/OT - Planning for discharge to SNF  Length of Stay: 28  [place] Lee, NP  03/07/2024, 9:27 AM  Advanced Heart Failure Team Pager [phone] (M-F; 7a - 5p)  Please contact [facility] for night-coverage after hours (5p -7a ) and weekends on [email or web address]   Patient seen and examined with the above-signed Advanced Practice Provider and/or Housestaff. I personally reviewed laboratory data, imaging studies and relevant notes. I independently examined the patient and formulated the important aspects of the plan. I have edited the note to reflect any of my changes or salient points. I have personally discussed the plan with the patient and/or family.  Continues to diurese with IV lasix  and diamox . Weight down another 2 pounds. Breathing ok. Remains in NSR on po amio No bleeding with Eliquis , Scr stable. Weight at abseline  General:  Elderly No resp difficulty HEENT: normal Neck: supple. JVP 8-9 Carotids 2+ bilat; no bruits. No lymphadenopathy or thryomegaly appreciated. Cor: Sternal wound ok Regular rate & rhythm. No rubs, gallops or murmurs. Lungs: clear Abdomen: obese soft, nontender, nondistended. No hepatosplenomegaly. No bruits or masses.  Good bowel sounds. Extremities: no cyanosis, clubbing, rash, 1+ edema + UNNA  Neuro: alert & orientedx3, cranial nerves grossly intact. moves all 4 extremities w/o difficulty. Affect pleasant  Continue IV lasix  one more day. Then switch to po. Continue amio and Eliquis . Continue PT/OT.   Jules Oar, MD  1:24 PM

## 2024-03-07 NOTE — TOC Progression Note (Signed)
 Transition of Care [facility]) - Progression Note    Patient Details  Name: [name] MRN: [id number] Date of Birth: 11-02-1940  [facility] CM/SW Contact  Ernst Heap Phone Number: [phone] 03/07/2024, 1:23 PM  Clinical Narrative:   HF CSW started patients insurance authorization for [facility] SN[facility]. Patients insurance [employer] is currently pending. Auth id: [id number]. Will update once insurance auth status changes.   TOC will continue following.     Expected Discharge Plan: Skilled Nursing Facility Barriers to Discharge: Continued Medical Work up  Expected Discharge Plan and Services In-house Referral: Clinical Social Work Discharge Planning Services: CM Consult Post Acute Care Choice: Home Health Living arrangements for the past 2 months: Single Family Home                           HH Arranged: RN, PT, OT HH Agency: [facility] Date [facility] Agency Contacted: 02/27/24 Time HH Agency Contacted: 1339 Representative spoke with at [facility] Agency: [name]   Social Determinants of Health (SDOH) Interventions SDOH Screenings   Food Insecurity: No Food Insecurity (02/08/2024)  Housing: Low Risk  (02/08/2024)  Transportation Needs: No Transportation Needs (02/08/2024)  Utilities: Not At Risk (02/08/2024)  Depression (PHQ2-9): Low Risk  (12/20/2023)  Social Connections: Socially Integrated (02/09/2024)  Tobacco Use: Low Risk  (02/08/2024)    Readmission Risk Interventions     No data to display

## 2024-03-07 NOTE — Progress Notes (Signed)
 CVP this AM 15 CVP 1800 10

## 2024-03-07 NOTE — Progress Notes (Signed)
 Mobility Specialist: Progress Note   03/07/24 1551  Mobility  Activity Ambulated with assistance in hallway  Level of Assistance Contact guard assist, steadying assist  Assistive Device Front wheel walker  Distance Ambulated (ft) 150 ft  RUE Weight Bearing Per Provider Order  (sternal precautions)  LUE Weight Bearing Per Provider Order  (sternal precautions)  Activity Response Tolerated well  Mobility Referral Yes  Mobility visit 1 Mobility  Mobility Specialist Start Time (ACUTE ONLY) 1521  Mobility Specialist Stop Time (ACUTE ONLY) 1533  Mobility Specialist Time Calculation (min) (ACUTE ONLY) 12 min    During Mobility (EOB after ambulation): SpO2 96% RA  Pt was eager for mobility session - received in bed. Husband present and helpful. SV for bed mobility and STS. CG for ambulation. C/o her legs feeling wobbly/weak. Ambulated to the nursing station and back without fault. Left in bed with all needs met, call bell in reach.    Deloria Fetch Mobility Specialist Please contact via SecureChat or Rehab office at [phone]

## 2024-03-07 NOTE — Progress Notes (Signed)
 Nutrition Follow-up  DOCUMENTATION CODES:  Not applicable  INTERVENTION:  Continue regular diet as ordered Encourage adequate PO intake Chopped meats with meals; Extra sauces and gravies on all trays [employer] Essentials TID, each packet mixed with 8 ounces of 2% milk provides 13 grams of protein and 260 calories. Magic cup TID with meals, each supplement provides 290 kcal and 9 grams of protein Ensure Enlive po BID when desired, each supplement provides 350 kcal and 20 grams of protein. MVI with minerals daily   NUTRITION DIAGNOSIS:  Increased nutrient needs related to acute illness as evidenced by estimated needs. - remains applicable  GOAL:  Patient will meet greater than or equal to 90% of their needs - progressing  MONITOR:   PO intake, Supplement acceptance, I & O's, Labs  REASON FOR ASSESSMENT:  Consult Assessment of nutrition requirement/status, Poor PO  ASSESSMENT:  83 y.o. presented for mitral valve repair surgery and remained intubated post-op. PMH includes A. Fib, anemia, and HTN.  4/09 - Op, s/p Mitral Valve repair; Admitted; extubated 4/18 - s/p DCCV 4/21 - SLP evaluation, recommend regular/thin liquids 4/23 - s.p FEES; transferred to floor  4/28 - s/p thoracentesis - yield 1.2L  Pt with multiple bedside nurses at time of visit. Spoke with pt's husband at bedside who reports that her oral intake has improved since last RD follow up. Per review of MAR, pt refusing/not receiving scheduled ensure. Her husband does not report whether she has been consuming carnation instant breakfast and magic cup.   Pt's husband reports plans to discharge to SNF likely Friday.   Meal completions: 5/3: 15% breakfast, 50% lunch, 50% dinner 5/4: 50% breakfast, 75% lunch 5/5: 90% breakfast, 100% lunch  Admit weight: 68.5 kg Current weight: 68.8 kg   UOP: x24 hours I/O's: -7.7L since 4/24   Medications: lasix  80mg  BID, magonate daily, melatonin, MVI,  protonix , klor-con , psyllium, IV Mg Sulfate   Labs: Sodium 132 Cr 1.40 Calcium  7.4 CBG's 38  Diet Order:   Diet Order             Diet regular Room service appropriate? Yes; Fluid consistency: Thin; Fluid restriction: 1500 mL Fluid  Diet effective now                   EDUCATION NEEDS:   No education needs have been identified at this time  Skin:  Skin Assessment: Reviewed RN Assessment  Last BM:  4/29 type 7 large  Height:  Ht Readings from Last 1 Encounters:  02/08/24 5\' 4"  (1.626 m)    Weight:  Wt Readings from Last 1 Encounters:  03/07/24 68.8 kg    Ideal Body Weight:  54.6 kg  BMI:  Body mass index is 26.04 kg/m.  Estimated Nutritional Needs:   Kcal:  1800-2000  Protein:  90-110 grams  Fluid:  >/= 1.8 L  Rocklin Chute, RDN, LDN Clinical Nutrition See AMiON for contact information.

## 2024-03-08 LAB — BASIC METABOLIC PANEL WITH GFR
Anion gap: 9 (ref 5–15)
BUN: 18 mg/dL (ref 8–23)
CO2: 23 mmol/L (ref 22–32)
Calcium: 7.4 mg/dL — ABNORMAL LOW (ref 8.9–10.3)
Chloride: 100 mmol/L (ref 98–111)
Creatinine, Ser: 1.4 mg/dL — ABNORMAL HIGH (ref 0.44–1.00)
GFR, Estimated: 38 mL/min — ABNORMAL LOW (ref >60)
Glucose, Bld: 89 mg/dL (ref 70–99)
Potassium: 4.1 mmol/L (ref 3.5–5.1)
Sodium: 132 mmol/L — ABNORMAL LOW (ref 135–145)

## 2024-03-08 LAB — URINE CULTURE: Culture: >=100000 — AB

## 2024-03-08 LAB — MAGNESIUM: Magnesium: 2.3 mg/dL (ref 1.7–2.4)

## 2024-03-08 MED ORDER — METOLAZONE 5 MG PO TABS
5.0000 mg | ORAL_TABLET | Freq: Once | ORAL | Status: AC
  Administered 2024-03-08: 5 mg via ORAL
  Filled 2024-03-08: qty 1

## 2024-03-08 MED ORDER — ACETAMINOPHEN 500 MG PO TABS: 500.0000 mg | ORAL_TABLET | Freq: Four times a day (QID) | ORAL | Status: DC | PRN

## 2024-03-08 MED ORDER — POTASSIUM CHLORIDE CRYS ER 20 MEQ PO TBCR: 40.0000 meq | EXTENDED_RELEASE_TABLET | ORAL | Status: DC

## 2024-03-08 MED ORDER — MAGNESIUM SULFATE 2 GM/50ML IV SOLN
2.0000 g | Freq: Once | INTRAVENOUS | Status: AC
  Administered 2024-03-08: 2 g via INTRAVENOUS
  Filled 2024-03-08 (×2): qty 50

## 2024-03-08 MED ORDER — DOCUSATE SODIUM 100 MG PO CAPS: 100.0000 mg | ORAL_CAPSULE | Freq: Two times a day (BID) | ORAL | Status: DC | PRN

## 2024-03-08 MED ORDER — FOSFOMYCIN TROMETHAMINE 3 G PO PACK
3.0000 g | PACK | Freq: Once | ORAL | Status: AC
  Administered 2024-03-08: 3 g via ORAL
  Filled 2024-03-08: qty 3

## 2024-03-08 NOTE — Progress Notes (Addendum)
 Advanced Heart Failure Rounding Note  Cardiologist: [name], MD  Chief Complaint: acute on chronic systolic heart failure  Patient Profile:    [name] is a 83 y.o. female with HTN and PAF. Admitted with SOB> found to have severe MR. Now s/p MVR, MAZE and LAA clipping. AHF team consulted for post-operative acute systolic heart failure.   Subjective:    2.2L in UOP yesterday. Wt down another 2 lb. Back down to admission wt but remains volume overloaded on exam. C/w elevated JVD and LEE. Breathing improved.  SCr improved 1.67>>1.40 today. CVP disconnected   SBPs 90s.   UA +. UCx growing E coli but asymptomatic.    Objective:    Weight Range: 68.8 kg Body mass index is 26.04 kg/m.   Vital Signs:   Temp:  [97.9 F (36.6 C)-98.1 F (36.7 C)] 98.1 F (36.7 C) (05/08 0900) Pulse Rate:  [70-88] 72 (05/08 0900) Resp:  [10-25] 15 (05/08 0900) BP: (93-111)/(53-59) 97/53 (05/08 0900) SpO2:  [95 %-100 %] 98 % (05/08 0900) Last BM Date : 03/06/24  Weight change: Filed Weights   03/05/24 0454 03/06/24 0400 03/07/24 0313  Weight: 68.8 kg 69.4 kg 68.8 kg   Intake/Output:  Intake/Output Summary (Last 24 hours) at 03/08/2024 1021 Last data filed at 03/08/2024 0945 Gross per 24 hour  Intake 920 ml  Output 2175 ml  Net -1255 ml    Physical Exam   General:  Well appearing. Sitting up in chair. No respiratory difficulty HEENT: normal Neck: supple. JVD elevated w/ large V-waves to jaw Cor: PMI nondisplaced. Regular rate & rhythm.  Lungs: decreased BS at bases bilaterally  Abdomen: soft, nontender, nondistended.  Extremities: no cyanosis, clubbing, rash, 2+ b/l LE edema + unna boots  Neuro: alert & oriented x 3, cranial nerves grossly intact. moves all 4 extremities w/o difficulty. Affect pleasant.   Telemetry   NSR 60s (personally reviewed)  EKG    N/A   Labs    CBC No results for input(s): "WBC", "NEUTROABS", "HGB", "HCT", "MCV", "PLT" in the last 72  hours. Basic Metabolic Panel Recent Labs    16/10/96 0530 03/08/24 0500  NA 132* 132*  K 4.0 4.1  CL 100 100  CO2 25 23  GLUCOSE 103* 89  BUN 18 18  CREATININE 1.67* 1.40*  CALCIUM  7.8* 7.4*  MG 1.9 2.3   BNP (last 3 results) Recent Labs    02/23/24 0430 02/24/24 0425 02/25/24 0436  BNP 753.5* 516.8* 420.0*   Imaging   No results found.  Medications:    Scheduled Medications:  amiodarone   200 mg Oral BID   apixaban   2.5 mg Oral BID   atorvastatin   10 mg Oral Daily   Chlorhexidine  Gluconate Cloth  6 each Topical Daily   [facility], Patient & Family Education   Does not apply Once   dapagliflozin  propanediol  10 mg Oral Daily   ezetimibe   10 mg Oral Daily   feeding supplement  237 mL Oral BID BM   furosemide   80 mg Intravenous BID   magnesium  gluconate  500 mg Oral Daily   melatonin  3 mg Oral QHS   multivitamin with minerals  1 tablet Oral Daily   pantoprazole   40 mg Oral Daily   potassium chloride   40 mEq Oral BID   psyllium  1 packet Oral Daily   sodium chloride  flush  3-10 mL Intravenous Q12H   spironolactone   25 mg Oral Daily  Infusions:  magnesium  sulfate bolus IVPB 2 g (03/08/24 0952)   promethazine  (PHENERGAN ) injection (IM or IVPB) Stopped (02/20/24 0000)   PRN Medications: acetaminophen , docusate sodium , loperamide , ondansetron  (ZOFRAN ) IV, mouth rinse, oxyCODONE , phenol, promethazine  (PHENERGAN ) injection (IM or IVPB), sodium chloride  flush, traMADol , witch hazel-glycerin   Assessment/Plan   Acute systolic heart failure, NiCM - Intra-op TEE 02/08/24 with EF 55-60% and severe MR - [facility] 2/25 with no CAD. RA 8, PA 41/17 (14), Fick CO/CI 7.76/4.41, PAPi 3, LVEDP 11 - Echo 4/25 with EF 35-40%, LV with GHK, RV normal, LA mod dilated, RA severely dilated, trivial MR, mod TR - Suspect drop in EF 2/2 persistent AF RVR.  - Volume improving but c/w volume overload on exam  - Continue one more day of Lasix  IV 80 mg bid. Add 5 mg of  metolazone   - Stop Farxiga  given UTI  - Continue spiro 25 mg daily - Continue UNNA boots  - additional GDMT limited by soft BP  - anticipate transition back to torsemide  tomorrow    Atrial fibrillation with RVR - S/p Maze and LAA clipping 4/25 - Failed DCCV X 2 4/17 - In/out of Afib this admit. Remains in SR on tele - Continue amiodarone  200 bid  - On Eliquis  2.5 mg bid (reduced dose given age and SCr). Monitor SCr closely, if remains < 1.4 will need to increase to 5 mg bid    Severe MR s/p MVR - Intra-op TEE with severe MR 02/08/24 - Trivial MR on echo 02/14/24   4. AKI - baseline Cr normal - Cr up slightly, 1.2>1.35>1.41>1.57>1.6>1.5>1.67>>1.40 today  - diuresis as above  5. Hypokalemia - resolved - continue spiro - give K supp w/ IV Lasix  today      6. Hypervolemic hyponatremia - Felt to be multifactorial, volume overload, component of SIADH - Nephrology added Ure-Na. Holding off on hypertonic saline as low as Na continues to improve   7. Left pleural effusion - S/p left thoracentesis on 4/28   8. Debility - Continue aggressive PT/OT - Planning for discharge to SNF  9. UTI - UA+. UCx + E coli  - asymptomatic but given new MV will treat w/ 1x dose of Fosfomycin   Length of Stay: [address], PA-C  03/08/2024, 10:21 AM  Advanced Heart Failure Team Pager [phone] (M-F; 7a - 5p)  Please contact [facility] for night-coverage after hours (5p -7a ) and weekends on [email or web address]  Patient seen and examined with the above-signed Advanced Practice Provider and/or Housestaff. I personally reviewed laboratory data, imaging studies and relevant notes. I independently examined the patient and formulated the important aspects of the plan. I have edited the note to reflect any of my changes or salient points. I have personally discussed the plan with the patient and/or family.  Fees good. Eager to go to SNF tomorrow. Denies CP or SOB. LE edema improving. Remains in NSR>  No bleeding on Eliquis . > 100K E.col in urine  General:  Elderly woman in chair  No resp difficulty HEENT: normal Neck: supple. JVP to jaw with prominent v waves . Carotids 2+ bilat; no bruits. No lymphadenopathy or thryomegaly appreciated. Cor: PMI nondisplaced. Regular rate & rhythm. No rubs, gallops or murmurs. Lungs: clear Abdomen: soft, nontender, nondistended. No hepatosplenomegaly. No bruits or masses. Good bowel sounds. Extremities: no cyanosis, clubbing, rash, 1+ edema + UNNA Neuro: alert & orientedx3, cranial nerves grossly intact. moves all 4 extremities w/o difficulty. Affect pleasant  Still volume overloaded but improved.  Diurese aggressively today to get her ready for likely d/c to SNF in am.   Spoke with ID PharmD and NP. Will give one dose Fosfomycin for UTI in setting of MVR.   Jules Oar, MD  2:04 PM

## 2024-03-08 NOTE — Progress Notes (Signed)
 Orthopedic Tech Progress Note Patient Details:  [name] 1941-03-19 [id number]  Unable to re-apply unna boots upon arrival as the old pair was still on, although we had been called to replace them and shared the initial pair would need to be removed first. Myrtie Atkinson, RN was advised that we would return as soon as we could and that we have multiple orders to complete. Unna boot paste and coban were left in the room.  We did explain that it is not within ortho techs' scope to remove unna boots and that clear steps to complete/help streamline this process can be found in the "Change [employer]" order.  Patient ID: [name], female   DOB: 1941-08-24, 83 y.o.   MRN: [id number]  [name] 03/08/2024, 7:05 PM

## 2024-03-08 NOTE — Progress Notes (Signed)
 Orthopedic Tech Progress Note Patient Details:  [name] [birth year] [id number]  Ortho Devices Type of Ortho Device: Radio broadcast assistant Ortho Device/Splint Location: BLE Ortho Device/Splint Interventions: Ordered, Adjustment, Application   Post Interventions Patient Tolerated: Well Instructions Provided: Care of device  [name] L [name] 03/08/2024, 9:44 PM

## 2024-03-08 NOTE — TOC Progression Note (Signed)
 Transition of Care [facility]) - Progression Note    Patient Details  Name: [name] MRN: [id number] Date of Birth: [birth year]  [facility] CM/SW Contact  Ernst Heap Phone Number: [phone] 03/08/2024, 11:22 AM  Clinical Narrative:   Patients insurance authorization was approved for 5/9-5/13 for [facility] SNF.  Auth id: [id number]  11:24 AM- HF CSW called and left a VM on the patients spouses phone with insurance auth updates.   11:25 AM- HF CSW called and spoke with the patient over the phone to share insurance auth updates.   TOC will continue following.    Expected Discharge Plan: Skilled Nursing Facility Barriers to Discharge: Continued Medical Work up  Expected Discharge Plan and Services In-house Referral: Clinical Social Work Discharge Planning Services: CM Consult Post Acute Care Choice: Home Health Living arrangements for the past 2 months: Single Family Home                           HH Arranged: RN, PT, OT HH Agency: [facility] Date [facility] Agency Contacted: 02/27/24 Time HH Agency Contacted: [phone] Representative spoke with at [facility] Agency: [name]   Social Determinants of Health (SDOH) Interventions SDOH Screenings   Food Insecurity: No Food Insecurity (02/08/2024)  Housing: Low Risk  (02/08/2024)  Transportation Needs: No Transportation Needs (02/08/2024)  Utilities: Not At Risk (02/08/2024)  Depression (PHQ2-9): Low Risk  (12/20/2023)  Social Connections: Socially Integrated (02/09/2024)  Tobacco Use: Low Risk  (02/08/2024)    Readmission Risk Interventions     No data to display

## 2024-03-08 NOTE — Progress Notes (Addendum)
      [address]       [place] [address]             [phone]    29 Days Post-Op Procedure(s) (LRB): MITRAL VALVE REPAIR USING A SIMULUS SEMI-RIGID ANNULOPLASTY BAND (N/A) MAZE PROCEDURE (N/A) CLIPPING, LEFT ATRIAL APPENDAGE USING A MEDTRONIC CLIP (N/A) ECHOCARDIOGRAM, TRANSESOPHAGEAL (N/A)  Subjective:  Patient continues to feel well.  She is sitting up in the chair.  No further diarrhea  Objective: Vital signs in last 24 hours: Temp:  [97.9 F (36.6 C)-98.1 F (36.7 C)] 98.1 F (36.7 C) (05/08 0422) Pulse Rate:  [70-88] 70 (05/08 0422) Cardiac Rhythm: Normal sinus rhythm (05/07 1900) Resp:  [10-25] 17 (05/08 0422) BP: (93-111)/(54-62) 93/54 (05/08 0422) SpO2:  [95 %-100 %] 95 % (05/08 0422)  Hemodynamic parameters for last 24 hours: CVP:  [8 mmHg-14 mmHg] 11 mmHg  Intake/Output from previous day: 05/07 0701 - 05/08 0700 In: 800 [P.O.:800] Out: 2175 [Urine:2175]  General appearance: alert, cooperative, and no distress Heart: regular rate and rhythm Lungs: diminished breath sounds bibasilar Abdomen: soft, non-tender; bowel sounds normal; no masses,  no organomegaly Extremities: edema much improved Wound: clean and dry, almost healed  Lab Results: No results for input(s): "WBC", "HGB", "HCT", "PLT" in the last 72 hours. BMET:  Recent Labs    03/06/24 0516 03/07/24 0530  NA 132* 132*  K 3.7 4.0  CL 99 100  CO2 25 25  GLUCOSE 97 103*  BUN 21 18  CREATININE 1.52* 1.67*  CALCIUM  7.8* 7.8*    PT/INR: No results for input(s): "LABPROT", "INR" in the last 72 hours. ABG    Component Value Date/Time   PHART 7.367 02/08/2024 1849   HCO3 23.9 02/08/2024 1849   TCO2 25 02/08/2024 1849   ACIDBASEDEF 2.0 02/08/2024 1849   O2SAT 82.9 02/28/2024 0447   CBG (last 3)  No results for input(s): "GLUCAP" in the last 72 hours.  Assessment/Plan: S/P Procedure(s) (LRB): MITRAL VALVE REPAIR USING A SIMULUS SEMI-RIGID ANNULOPLASTY  BAND (N/A) MAZE PROCEDURE (N/A) CLIPPING, LEFT ATRIAL APPENDAGE USING A MEDTRONIC CLIP (N/A) ECHOCARDIOGRAM, TRANSESOPHAGEAL (N/A)  CV- maintaining NSR, Amiodarone  decreased to 200 mg BID, Eliquis  for stroke prophylaxis Pulm- not requiring oxygen, continue IS Acute Systolic Heart Failure, Non-Ischemic Cardiomyopathy- on Farxiga , Aldactone  Renal-creatinine is down to 1.40 diuretic regimen per AHF  GI- diarrhea improved, continue fiber supplement GU-culture showing E. Coli, being re incubated for better growth.. suspect bacturia... in setting of MV Repair.. may benefit from antibiotics.Aaron Aas will discuss with Dr. Sherene Dilling Deconditioning- working with PT/OT, SNF offer at [facility]- needs BMET to likely determine diuretic regimen, possibly ready for d/c to SNF this week   LOS: 29 days    Gates Kasal, PA-C 03/08/2024   Chart reviewed, patient examined, agree with above.  She diuresed well yesteday and -980 so far today. Wt is back to admission wt if accurate. Creat down to 1.4 today.  Given dose of fosfomycin today for uncomplicated UTI. Plan discharge to SNF tomorrow if no changes.

## 2024-03-08 NOTE — Care Management Important Message (Signed)
 Important Message  Patient Details  Name: [name] MRN: [id number] Date of Birth: [birth year]   Important Message Given:  Yes - Medicare IM     Felix Host 03/08/2024, 11:14 AM

## 2024-03-08 NOTE — Progress Notes (Signed)
 Physical Therapy Treatment Patient Details Name: [name] MRN: [id number] DOB: 02/23/1941 Today's Date: 03/08/2024   History of Present Illness 83 y.o. female presents to [facility] 02/08/24 for complex MVR and MAZE (via sternotomy). Post-op complicated by persistent a fib RVR and volume overload. Cardioversion x2 4/18 without resolution of afib.  4/21 returned to NSR. 4/9-4/23 ICU stay. 4/28 pt with L pleural effusion s/p L thoracentesis. PMHx: anemia, HTN, mitral valve prolapse, moderate mitral regurgitation, tricuspid regurgitation    PT Comments  Pt sitting in chair on arrival, agreeable to therapy session. Pt completed sit<>stand transfer with up to minA to boost to stand. Pt demonstrated step pivot transfer from chair>BSC with CGA for safety this session. Pt demonstrated gait this session with RW support and CGA for safety, however unable to progress distance this session, due to increased LLE pain with pt stating weakness. Pt educated on energy conservation techniques, completing seated ADLs at sink. Patient will benefit from continued inpatient follow up therapy, <3 hours/day     If plan is discharge home, recommend the following: A little help with walking and/or transfers;A little help with bathing/dressing/bathroom;Assistance with cooking/housework;Assist for transportation;Help with stairs or ramp for entrance   Can travel by private vehicle     Yes  Equipment Recommendations  BSC/3in1    Recommendations for Other Services       Precautions / Restrictions Precautions Precautions: Fall;Sternal Precaution Booklet Issued: Yes (comment) Recall of Precautions/Restrictions: Intact Precaution/Restrictions Comments: good recall and implementation of precautions throughout Restrictions Weight Bearing Restrictions Per Provider Order: No Other Position/Activity Restrictions: sternal precautions     Mobility  Bed Mobility Overal bed mobility: Needs Assistance             General  bed mobility comments: OOB in chair    Transfers Overall transfer level: Needs assistance Equipment used: None, Rolling walker (2 wheels) Transfers: Sit to/from Stand, Bed to chair/wheelchair/BSC Sit to Stand: Contact guard assist, Min assist   Step pivot transfers: Contact guard assist       General transfer comment: up to minA to boost to stand    Ambulation/Gait Ambulation/Gait assistance: Contact guard assist Gait Distance (Feet): 36 Feet Assistive device: Rolling walker (2 wheels) Gait Pattern/deviations: Step-through pattern, Decreased stride length Gait velocity: decr     General Gait Details: CGA for saftey, limited by LLE pain this session, deterring further gait   Stairs             Wheelchair Mobility     Tilt Bed    Modified Rankin (Stroke Patients Only)       Balance Overall balance assessment: Needs assistance Sitting-balance support: No upper extremity supported, Feet supported Sitting balance-Leahy Scale: Fair Sitting balance - Comments: in recliner/armless chair   Standing balance support: No upper extremity supported Standing balance-Leahy Scale: Fair Standing balance comment: able to statically stand for ~2 min at [facility] for pericare  with no AD and no LOB                            Communication Communication Communication: Impaired Factors Affecting Communication: Hearing impaired  Cognition Arousal: Alert Behavior During Therapy: WFL for tasks assessed/performed   PT - Cognitive impairments: No apparent impairments                         Following commands: Intact      Cueing Cueing Techniques: Verbal cues  Exercises  Other Exercises Other Exercises: bridging x3 in supine    General Comments General comments (skin integrity, edema, etc.): VSS on RA      Pertinent Vitals/Pain Pain Assessment Pain Assessment: Faces Faces Pain Scale: Hurts a little bit Pain Location: LLE Pain Descriptors /  Indicators: Discomfort Pain Intervention(s): Limited activity within patient's tolerance    Home Living                          Prior Function            PT Goals (current goals can now be found in the care plan section) Acute Rehab PT Goals PT Goal Formulation: With patient/family Time For Goal Achievement: 03/08/24 Progress towards PT goals: Not progressing toward goals - comment (increased LLE pain/weaknes this session)    Frequency    Min 2X/week      PT Plan      Co-evaluation              AM-PAC PT "6 Clicks" Mobility   Outcome Measure  Help needed turning from your back to your side while in a flat bed without using bedrails?: A Little Help needed moving from lying on your back to sitting on the side of a flat bed without using bedrails?: A Little Help needed moving to and from a bed to a chair (including a wheelchair)?: A Little Help needed standing up from a chair using your arms (e.g., wheelchair or bedside chair)?: A Little Help needed to walk in hospital room?: A Little Help needed climbing 3-5 steps with a railing? : A Little 6 Click Score: 18    End of Session   Activity Tolerance: Patient limited by pain Patient left: in bed;with call bell/phone within reach;with family/visitor present Nurse Communication: Mobility status PT Visit Diagnosis: Unsteadiness on feet (R26.81);Muscle weakness (generalized) (M62.81);Other abnormalities of gait and mobility (R26.89)     Time: 1610-9604 PT Time Calculation (min) (ACUTE ONLY): 19 min  Charges:    $Therapeutic Activity: 8-22 mins PT General Charges $$ ACUTE PT VISIT: 1 Visit                     [name], [name] 03/08/2024, 2:59 PM

## 2024-03-08 NOTE — Plan of Care (Signed)
  Problem: Clinical Measurements: Goal: Respiratory complications will improve Outcome: Adequate for Discharge   

## 2024-03-09 DIAGNOSIS — Z9889 Other specified postprocedural states: Secondary | ICD-10-CM

## 2024-03-09 LAB — BASIC METABOLIC PANEL WITH GFR
Anion gap: 10 (ref 5–15)
BUN: 16 mg/dL (ref 8–23)
CO2: 25 mmol/L (ref 22–32)
Calcium: 7.7 mg/dL — ABNORMAL LOW (ref 8.9–10.3)
Chloride: 95 mmol/L — ABNORMAL LOW (ref 98–111)
Creatinine, Ser: 1.43 mg/dL — ABNORMAL HIGH (ref 0.44–1.00)
GFR, Estimated: 37 mL/min — ABNORMAL LOW (ref >60)
Glucose, Bld: 96 mg/dL (ref 70–99)
Potassium: 3.4 mmol/L — ABNORMAL LOW (ref 3.5–5.1)
Sodium: 130 mmol/L — ABNORMAL LOW (ref 135–145)

## 2024-03-09 LAB — MAGNESIUM: Magnesium: 2.4 mg/dL (ref 1.7–2.4)

## 2024-03-09 MED ORDER — AMIODARONE HCL 200 MG PO TABS: 200.0000 mg | ORAL_TABLET | Freq: Two times a day (BID) | ORAL | Status: DC

## 2024-03-09 MED ORDER — POTASSIUM CHLORIDE 20 MEQ PO PACK
60.0000 meq | PACK | Freq: Once | ORAL | Status: AC
  Administered 2024-03-09: 60 meq via ORAL
  Filled 2024-03-09: qty 3

## 2024-03-09 MED ORDER — SPIRONOLACTONE 25 MG PO TABS: 25.0000 mg | ORAL_TABLET | Freq: Every day | ORAL | Status: DC

## 2024-03-09 MED ORDER — METOLAZONE 5 MG PO TABS
5.0000 mg | ORAL_TABLET | Freq: Once | ORAL | Status: AC
  Administered 2024-03-09: 5 mg via ORAL
  Filled 2024-03-09: qty 1

## 2024-03-09 MED ORDER — POTASSIUM CHLORIDE CRYS ER 20 MEQ PO TBCR
60.0000 meq | EXTENDED_RELEASE_TABLET | Freq: Once | ORAL | Status: AC
  Administered 2024-03-09: 60 meq via ORAL
  Filled 2024-03-09: qty 3

## 2024-03-09 MED ORDER — CARMEX CLASSIC LIP BALM EX OINT
TOPICAL_OINTMENT | CUTANEOUS | Status: DC | PRN
  Filled 2024-03-09: qty 10

## 2024-03-09 MED ORDER — TORSEMIDE 20 MG PO TABS
40.0000 mg | ORAL_TABLET | Freq: Two times a day (BID) | ORAL | Status: DC
  Administered 2024-03-10: 40 mg via ORAL
  Filled 2024-03-09: qty 2

## 2024-03-09 MED ORDER — PHENOL 1.4 % MT LIQD: 1.0000 | OROMUCOSAL | Status: DC | PRN

## 2024-03-09 MED ORDER — MELATONIN 3 MG PO TABS: 3.0000 mg | ORAL_TABLET | Freq: Every day | ORAL | Status: DC

## 2024-03-09 MED ORDER — POTASSIUM CHLORIDE CRYS ER 20 MEQ PO TBCR: 40.0000 meq | EXTENDED_RELEASE_TABLET | ORAL | Status: DC

## 2024-03-09 MED ORDER — METOLAZONE 5 MG PO TABS: 5.0000 mg | ORAL_TABLET | ORAL | Status: DC

## 2024-03-09 MED ORDER — DOCUSATE SODIUM 100 MG PO CAPS: 100.0000 mg | ORAL_CAPSULE | Freq: Two times a day (BID) | ORAL | Status: DC | PRN

## 2024-03-09 MED ORDER — WITCH HAZEL-GLYCERIN EX PADS: MEDICATED_PAD | CUTANEOUS | Status: DC | PRN

## 2024-03-09 MED ORDER — ACETAMINOPHEN 500 MG PO TABS: 500.0000 mg | ORAL_TABLET | Freq: Four times a day (QID) | ORAL | Status: AC | PRN

## 2024-03-09 MED ORDER — ENSURE ENLIVE PO LIQD: 237.0000 mL | Freq: Two times a day (BID) | ORAL | Status: DC

## 2024-03-09 MED ORDER — OXYCODONE HCL 5 MG PO TABS: 5.0000 mg | ORAL_TABLET | ORAL | 0 refills | Status: DC | PRN

## 2024-03-09 MED ORDER — MAGNESIUM GLUCONATE 500 (27 MG) MG PO TABS: 500.0000 mg | ORAL_TABLET | Freq: Every day | ORAL | Status: AC

## 2024-03-09 MED ORDER — POTASSIUM CHLORIDE CRYS ER 20 MEQ PO TBCR
20.0000 meq | EXTENDED_RELEASE_TABLET | Freq: Every day | ORAL | Status: DC
  Administered 2024-03-10: 20 meq via ORAL
  Filled 2024-03-09: qty 1

## 2024-03-09 MED ORDER — PSYLLIUM 95 % PO PACK: 1.0000 | PACK | Freq: Every day | ORAL | Status: DC

## 2024-03-09 MED ORDER — APIXABAN 5 MG PO TABS
5.0000 mg | ORAL_TABLET | Freq: Two times a day (BID) | ORAL | Status: DC
  Administered 2024-03-09 – 2024-03-10 (×3): 5 mg via ORAL
  Filled 2024-03-09 (×3): qty 1

## 2024-03-09 NOTE — Progress Notes (Addendum)
 Advanced Heart Failure Rounding Note  Cardiologist: [name], MD  Chief Complaint: acute on chronic systolic heart failure  Patient Profile:    [name] is a 83 y.o. female with HTN and PAF. Admitted with SOB> found to have severe MR. Now s/p MVR, MAZE and LAA clipping. AHF team consulted for post-operative acute systolic heart failure.   Subjective:    Diuresing well. 4.5 L in UOP yesterday. Wt trending down. Volume status improving but remains volume up. CVP 12   Scr 1.67>>1.40>>1.43 today K 3.4 Mg 2.4   Overall feels better. Leg edema improved. Breathing improved. No resting dyspnea.   Objective:    Weight Range: 65.9 kg Body mass index is 24.94 kg/m.   Vital Signs:   Temp:  [97.7 F (36.5 C)-98.3 F (36.8 C)] 97.7 F (36.5 C) (05/09 0745) Pulse Rate:  [66-74] 74 (05/09 0745) Resp:  [18-21] 20 (05/09 0745) BP: (96-104)/(50-55) 96/50 (05/09 0745) SpO2:  [92 %-99 %] 99 % (05/09 0745) Weight:  [65.9 kg] 65.9 kg (05/09 0500) Last BM Date : 03/08/24  Weight change: Filed Weights   03/06/24 0400 03/07/24 0313 03/09/24 0500  Weight: 69.4 kg 68.8 kg 65.9 kg   Intake/Output:  Intake/Output Summary (Last 24 hours) at 03/09/2024 1101 Last data filed at 03/09/2024 1030 Gross per 24 hour  Intake 280 ml  Output 4550 ml  Net -4270 ml    Physical Exam   CVP 12  General:  Well appearing. No respiratory difficulty HEENT: normal Neck: supple. JVD 10-11 cm. Carotids 2+ bilat; no bruits. No lymphadenopathy or thyromegaly appreciated. Cor: PMI nondisplaced. Regular rate & rhythm. No rubs, gallops or murmurs. Lungs: clear Abdomen: soft, nontender, nondistended. No hepatosplenomegaly. No bruits or masses. Good bowel sounds. Extremities: no cyanosis, clubbing, rash, 1+ b/l LE edema (improving) + unna boots + RUE PICC  Neuro: alert & oriented x 3, cranial nerves grossly intact. moves all 4 extremities w/o difficulty. Affect pleasant.   Telemetry   NSR 60s (personally  reviewed)  EKG    N/A   Labs    CBC No results for input(s): "WBC", "NEUTROABS", "HGB", "HCT", "MCV", "PLT" in the last 72 hours. Basic Metabolic Panel Recent Labs    16/10/96 0500 03/09/24 0500  NA 132* 130*  K 4.1 3.4*  CL 100 95*  CO2 23 25  GLUCOSE 89 96  BUN 18 16  CREATININE 1.40* 1.43*  CALCIUM  7.4* 7.7*  MG 2.3 2.4   BNP (last 3 results) Recent Labs    02/23/24 0430 02/24/24 0425 02/25/24 0436  BNP 753.5* 516.8* 420.0*   Imaging   No results found.  Medications:    Scheduled Medications:  amiodarone   200 mg Oral BID   apixaban   5 mg Oral BID   atorvastatin   10 mg Oral Daily   Chlorhexidine  Gluconate Cloth  6 each Topical Daily   [facility] Patient [facility] Family Education   Does not apply Once   ezetimibe   10 mg Oral Daily   feeding supplement  237 mL Oral BID BM   furosemide   80 mg Intravenous BID   magnesium  gluconate  500 mg Oral Daily   melatonin  3 mg Oral QHS   metolazone   5 mg Oral Once   multivitamin with minerals  1 tablet Oral Daily   pantoprazole   40 mg Oral Daily   potassium chloride   60 mEq Oral Once   psyllium  1 packet Oral Daily   sodium chloride   flush  3-10 mL Intravenous Q12H   spironolactone   25 mg Oral Daily   Infusions:  promethazine  (PHENERGAN ) injection (IM or IVPB) Stopped (02/20/24 0000)   PRN Medications: acetaminophen , docusate sodium , loperamide , ondansetron  (ZOFRAN ) IV, mouth rinse, oxyCODONE , phenol, promethazine  (PHENERGAN ) injection (IM or IVPB), sodium chloride  flush, traMADol , witch hazel-glycerin   Assessment/Plan   Acute systolic heart failure, NiCM - Intra-op TEE 02/08/24 with EF 55-60% and severe MR - [facility] 2/25 with no CAD. RA 8, PA 41/17 (14), Fick CO/CI 7.76/4.41, PAPi 3, LVEDP 11 - Echo 4/25 with EF 35-40%, LV with GHK, RV normal, LA mod dilated, RA severely dilated, trivial MR, mod TR - Suspect drop in EF 2/2 persistent AF RVR.  - Diuresing well. Volume status improving. CVP 12 today.  Still w/ trace-1+ b/LEE on exam  - Give 1 dose of IV Lasix  80 mg this morning + 5 mg of metolazone . If good UOP, can possibly d/c to SNF for rehab this afternoon  - Now off Farxiga  given UTI  - Continue spiro 25 mg daily - Continue UNNA boots  - additional GDMT limited by soft BP    Atrial fibrillation with RVR - S/p Maze and LAA clipping 4/25 - Failed DCCV X 2 4/17 - In/out of Afib this admit. Remains in NSR on tele, HR 60s  - Continue amiodarone  200 bid  - On Eliquis  2.5 mg bid (reduced dose given age and SCr). Monitor SCr closely, if remains < 1.4, after diuresis, will need to increase to 5 mg bid    Severe MR s/p MVR - Intra-op TEE with severe MR 02/08/24 - Trivial MR on echo 02/14/24   4. AKI - baseline Cr normal - Cr up slightly, 1.2>1.35>1.41>1.57>1.6>1.5>1.67>>1.40>>1.43 today  - diuresis as above  5. Hypokalemia - 3.4 today  - continue spiro - give K supp w/ IV Lasix  today. D/w pharmD  - repeat BMP this afternoon       6. Hypervolemic hyponatremia - Felt to be multifactorial, volume overload, component of SIADH - Nephrology added Ure-Na. Holding off on hypertonic saline as low as Na continues to improve - diuretics per above    7. Left pleural effusion - S/p left thoracentesis on 4/28   8. Debility - Continue aggressive PT/OT - Planning for discharge to SNF  9. UTI - UA+. UCx + E coli  - asymptomatic but given new MV, she was given 1x dose of Fosfomycin   Dr. Sorin Frimpong to assess this afternoon. If volume status improved, can likely be discharged to SNF for rehab later today. Will leave final meds recs if cleared to go. F/u has been arranged. Appt info in AVS.    Cardiac Meds for Discharge  Torsemide  40 bid Metolazone  2.5 mg Q Mondays and  Fridays  KCl 20 mEq daily w/ extra 40 mEq on metolazone  days  Amiodarone  200 mg bid Eliquis  5 mg bid Lipitor 10 mg daily  Zetia  10 mg  Spironolactone  25 mg daily    Length of Stay: [address] PA-C   03/09/2024, 11:01 AM  Advanced Heart Failure Team Pager [phone] (M-F; 7a - 5p)  Please contact [facility] for night-coverage after hours (5p -7a ) and weekends on [email or web address]   Patient seen and examined with the above-signed Advanced Practice Provider and/or Housestaff. I personally reviewed laboratory data, imaging studies and relevant notes. I independently examined the patient and formulated the important aspects of the plan. I have edited the note to reflect any of my changes or  salient points. I have personally discussed the plan with the patient and/or family.  Doing well. Ambulating with PT. Denies CP or SOB   CVP 12. Good diuresis with IV lasix  SCr stable K low (being supped)  General:  Elderly. No resp difficulty HEENT: normal Neck: supple. JVP 12 with prominent v waves. Carotids 2+ bilat; no bruits. No lymphadenopathy or thryomegaly appreciated. Cor: PMI nondisplaced. Regular rate & rhythm. No rubs, gallops or murmurs. Lungs: clear Abdomen: soft, nontender, nondistended. No hepatosplenomegaly. No bruits or masses. Good bowel sounds. Extremities: no cyanosis, clubbing, rash, tr-1+ edema + UNNA Neuro: alert & orientedx3, cranial nerves grossly intact. moves all 4 extremities w/o difficulty. Affect pleasant  Volume status much improved. Still mildly overloaded but not bad.   Agree with d/c meds as above. Will see next week in Clinic to reassess volume status.   Continue amio and Eliquis  for AF.   Jules Oar, MD  2:10 PM

## 2024-03-09 NOTE — Plan of Care (Signed)
   Problem: Clinical Measurements: Goal: Respiratory complications will improve Outcome: Progressing

## 2024-03-09 NOTE — Progress Notes (Signed)
 Mobility Specialist Progress Note:    03/09/24 1000  Mobility  Activity Transferred to/from [facility];Ambulated with assistance in room  Level of Assistance Minimal assist, patient does 75% or more  Assistive Device Front wheel walker  Distance Ambulated (ft) 15 ft  RUE Weight Bearing Per Provider Order NWB  LUE Weight Bearing Per Provider Order NWB  Activity Response Tolerated well  Mobility Referral Yes  Mobility visit 1 Mobility  Mobility Specialist Start Time (ACUTE ONLY) 0945  Mobility Specialist Stop Time (ACUTE ONLY) 1000  Mobility Specialist Time Calculation (min) (ACUTE ONLY) 15 min   Pt requested assistance to transfer C>BSC. Required MinA to stand, CGA while ambulating with RW. Tolerated well, void successful and VSS throughout. Assisted pt to sink for bath. Left pt in care of NT, all needs met.    Brindy Higginbotham Mobility Specialist Please contact via Special educational needs teacher or  Rehab office at [phone]

## 2024-03-09 NOTE — Progress Notes (Signed)
 Physical Therapy Treatment Patient Details Name: [name] MRN: [id number] DOB: [birth year] Today's Date: 03/09/2024   History of Present Illness 83 y.o. female presents to [facility] 02/08/24 for complex MVR and MAZE (via sternotomy). Post-op complicated by persistent a fib RVR and volume overload. Cardioversion x2 4/18 without resolution of afib.  4/21 returned to NSR. 4/9-4/23 ICU stay. 4/28 pt with L pleural effusion s/p L thoracentesis. PMHx: anemia, HTN, mitral valve prolapse, moderate mitral regurgitation, tricuspid regurgitation    PT Comments  Pt sitting up in chair on arrival, pleasant and agreeable to therapy session with continued progress toward acute goals. Pt demonstrating gait this session with RW support and CGA for safety. Pt needing occasional cues for precautions with transfers this session, however pt mostly able to follow without reminder. Pt with good usage of IS on arrival, and pt verbalizing continued importance. Patient will benefit from continued inpatient follow up therapy, <3 hours/day    If plan is discharge home, recommend the following: A little help with walking and/or transfers;A little help with bathing/dressing/bathroom;Assistance with cooking/housework;Assist for transportation;Help with stairs or ramp for entrance   Can travel by private vehicle     Yes  Equipment Recommendations  BSC/3in1    Recommendations for Other Services       Precautions / Restrictions Precautions Precautions: Fall;Sternal Precaution Booklet Issued: Yes (comment) Recall of Precautions/Restrictions: Intact Precaution/Restrictions Comments: occasional cues for precautions Restrictions Weight Bearing Restrictions Per Provider Order: No Other Position/Activity Restrictions: sternal precautions     Mobility  Bed Mobility Overal bed mobility: Needs Assistance Bed Mobility: Sit to Sidelying         Sit to sidelying: Contact guard assist General bed mobility comments: OOB in  chair on arrival, increased time to bring LE into bed    Transfers Overall transfer level: Needs assistance Equipment used: None, Rolling walker (2 wheels) Transfers: Sit to/from Stand, Bed to chair/wheelchair/BSC Sit to Stand: Contact guard assist, Min assist   Step pivot transfers: Contact guard assist       General transfer comment: up to minA to boost to stand from chair on first attempt, cues for bigger rocks on second with pt able to STS with CGA for saftey    Ambulation/Gait Ambulation/Gait assistance: Contact guard assist Gait Distance (Feet): 130 Feet Assistive device: Rolling walker (2 wheels) Gait Pattern/deviations: Step-through pattern, Decreased stride length Gait velocity: decr     General Gait Details: CGA for saftey   Stairs             Wheelchair Mobility     Tilt Bed    Modified Rankin (Stroke Patients Only)       Balance Overall balance assessment: Needs assistance Sitting-balance support: No upper extremity supported, Feet supported Sitting balance-Leahy Scale: Fair Sitting balance - Comments: in recliner/armless chair   Standing balance support: No upper extremity supported Standing balance-Leahy Scale: Fair Standing balance comment: able to statically stand for ~2 min at [facility] for pericare  with no AD and no LOB                            Communication Communication Communication: Impaired Factors Affecting Communication: Hearing impaired  Cognition Arousal: Alert Behavior During Therapy: WFL for tasks assessed/performed   PT - Cognitive impairments: No apparent impairments                         Following commands: Intact  Cueing Cueing Techniques: Verbal cues  Exercises      General Comments General comments (skin integrity, edema, etc.): VSS on RA      Pertinent Vitals/Pain Pain Assessment Pain Assessment: Faces Faces Pain Scale: Hurts a little bit Pain Location: BLE Pain Descriptors /  Indicators: Discomfort Pain Intervention(s): Limited activity within patient's tolerance    Home Living                          Prior Function            PT Goals (current goals can now be found in the care plan section) Acute Rehab PT Goals PT Goal Formulation: With patient/family Time For Goal Achievement: 03/08/24 Progress towards PT goals: Progressing toward goals    Frequency    Min 2X/week      PT Plan      Co-evaluation              AM-PAC PT "6 Clicks" Mobility   Outcome Measure  Help needed turning from your back to your side while in a flat bed without using bedrails?: A Little Help needed moving from lying on your back to sitting on the side of a flat bed without using bedrails?: A Little Help needed moving to and from a bed to a chair (including a wheelchair)?: A Little Help needed standing up from a chair using your arms (e.g., wheelchair or bedside chair)?: A Little Help needed to walk in hospital room?: A Little Help needed climbing 3-5 steps with a railing? : A Little 6 Click Score: 18    End of Session   Activity Tolerance: Patient tolerated treatment well Patient left: in bed;with call bell/phone within reach;with family/visitor present Nurse Communication: Mobility status PT Visit Diagnosis: Unsteadiness on feet (R26.81);Muscle weakness (generalized) (M62.81);Other abnormalities of gait and mobility (R26.89)     Time: 1346-1406 PT Time Calculation (min) (ACUTE ONLY): 20 min  Charges:    $Gait Training: 8-22 mins PT General Charges $$ ACUTE PT VISIT: 1 Visit                     Tashae, Glenny 03/09/2024, 2:26 PM

## 2024-03-09 NOTE — TOC Progression Note (Signed)
 Transition of Care [facility]) - Progression Note    Patient Details  Name: [name] MRN: [id number] Date of Birth: 28-Sep-1941  [facility] of [facility] CM/SW Contact  Valery Gaucher, [place] Phone Number: 03/09/2024, 1:55 PM  Clinical Narrative:     CSW contacted SNF/[facility], informed of anticipated d/c tomorrow. SNF  can admit over the weekend.  TOC will continue to follow and assist with discharge planning.   Liddie Reel, MSW, LCSW Clinical Social Worker    Expected Discharge Plan: Skilled Nursing Facility Barriers to Discharge: Continued Medical Work up  Expected Discharge Plan and Services In-house Referral: Clinical Social Work Discharge Planning Services: CM Consult Post Acute Care Choice: Home Health Living arrangements for the past 2 months: Single Family Home                           HH Arranged: RN, PT, OT HH Agency: [facility] Date [facility] Agency Contacted: 02/27/24 Time HH Agency Contacted: 1339 Representative spoke with at [facility] Agency: [name]   Social Determinants of Health (SDOH) Interventions SDOH Screenings   Food Insecurity: No Food Insecurity (02/08/2024)  Housing: Low Risk  (02/08/2024)  Transportation Needs: No Transportation Needs (02/08/2024)  Utilities: Not At Risk (02/08/2024)  Depression (PHQ2-9): Low Risk  (12/20/2023)  Social Connections: Socially Integrated (02/09/2024)  Tobacco Use: Low Risk  (02/08/2024)    Readmission Risk Interventions     No data to display

## 2024-03-09 NOTE — Progress Notes (Signed)
      [address]       [place] [address]             [phone]       30 Days Post-Op Procedure(s) (LRB): MITRAL VALVE REPAIR USING A SIMULUS SEMI-RIGID ANNULOPLASTY BAND (N/A) MAZE PROCEDURE (N/A) CLIPPING, LEFT ATRIAL APPENDAGE USING A MEDTRONIC CLIP (N/A) ECHOCARDIOGRAM, TRANSESOPHAGEAL (N/A)  Subjective:  Patient sitting up in chair.  Overall doing very well.  She was able to move her bowels yesterday.  She would like to get up to the sink to wash up today.  Objective: Vital signs in last 24 hours: Temp:  [97.7 F (36.5 C)-98.3 F (36.8 C)] 98 F (36.7 C) (05/09 0540) Pulse Rate:  [66-73] 66 (05/08 2331) Cardiac Rhythm: Normal sinus rhythm (05/08 1949) Resp:  [15-21] 20 (05/08 2331) BP: (97-104)/(51-55) 104/55 (05/08 1951) SpO2:  [92 %-99 %] 92 % (05/08 2331) Weight:  [65.9 kg] 65.9 kg (05/09 0500)  Hemodynamic parameters for last 24 hours: CVP:  [10 mmHg-14 mmHg] 10 mmHg  Intake/Output from previous day: 05/08 0701 - 05/09 0700 In: 400 [P.O.:400] Out: 4450 [Urine:4450]  General appearance: alert, cooperative, and no distress Heart: regular rate and rhythm Lungs: diminished breath sounds bibasilar Abdomen: soft, non-tender; bowel sounds normal; no masses,  no organomegaly Extremities: edema much improved, una boots remain in place Wound: clean and dry  Lab Results: No results for input(s): "WBC", "HGB", "HCT", "PLT" in the last 72 hours. BMET:  Recent Labs    03/08/24 0500 03/09/24 0500  NA 132* 130*  K 4.1 3.4*  CL 100 95*  CO2 23 25  GLUCOSE 89 96  BUN 18 16  CREATININE 1.40* 1.43*  CALCIUM  7.4* 7.7*    PT/INR: No results for input(s): "LABPROT", "INR" in the last 72 hours. ABG    Component Value Date/Time   PHART 7.367 02/08/2024 1849   HCO3 23.9 02/08/2024 1849   TCO2 25 02/08/2024 1849   ACIDBASEDEF 2.0 02/08/2024 1849   O2SAT 82.9 02/28/2024 0447   CBG (last 3)  No results for input(s): "GLUCAP" in the  last 72 hours.  Assessment/Plan: S/P Procedure(s) (LRB): MITRAL VALVE REPAIR USING A SIMULUS SEMI-RIGID ANNULOPLASTY BAND (N/A) MAZE PROCEDURE (N/A) CLIPPING, LEFT ATRIAL APPENDAGE USING A MEDTRONIC CLIP (N/A) ECHOCARDIOGRAM, TRANSESOPHAGEAL (N/A)  CV- maintaining NSR- on Amiodarone  200 mg BID, Eliquis  for stroke Pulm- no acute issues, off oxygen, continue IS Acute Systolic Heart Failure, Non-Ischemic Cardiomyopathy- on Farxiga , Aldactone  Renal- creatinine remains stable at 1.43... diuretics per AHF, she has been diuresing well over past several days Hypokalemia- at 3.4, due to aggressive diuretic regimen.. continue 80 meq daily for now GU- E.Coli UTI, treated with  Fosfomycin Deconditioning- PT/OT recs SNF, this has been arranged  Dispo- patient likely ready for d/c to SNF today, replace K, awaiting diuretic regimen for AHF, also see if any further adjustments needs made   LOS: 30 days    Gates Kasal, PA-C 03/09/2024

## 2024-03-10 ENCOUNTER — Other Ambulatory Visit: Payer: Self-pay | Admitting: Thoracic Surgery (Cardiothoracic Vascular Surgery)

## 2024-03-10 DIAGNOSIS — M6281 Muscle weakness (generalized): Secondary | ICD-10-CM | POA: Diagnosis not present

## 2024-03-10 DIAGNOSIS — I5022 Chronic systolic (congestive) heart failure: Secondary | ICD-10-CM | POA: Diagnosis not present

## 2024-03-10 DIAGNOSIS — I10 Essential (primary) hypertension: Secondary | ICD-10-CM | POA: Diagnosis not present

## 2024-03-10 DIAGNOSIS — J90 Pleural effusion, not elsewhere classified: Secondary | ICD-10-CM | POA: Diagnosis not present

## 2024-03-10 DIAGNOSIS — D649 Anemia, unspecified: Secondary | ICD-10-CM | POA: Diagnosis not present

## 2024-03-10 DIAGNOSIS — E785 Hyperlipidemia, unspecified: Secondary | ICD-10-CM | POA: Diagnosis not present

## 2024-03-10 DIAGNOSIS — I081 Rheumatic disorders of both mitral and tricuspid valves: Secondary | ICD-10-CM | POA: Diagnosis not present

## 2024-03-10 DIAGNOSIS — L89150 Pressure ulcer of sacral region, unstageable: Secondary | ICD-10-CM | POA: Diagnosis not present

## 2024-03-10 DIAGNOSIS — I959 Hypotension, unspecified: Secondary | ICD-10-CM | POA: Diagnosis not present

## 2024-03-10 DIAGNOSIS — I272 Pulmonary hypertension, unspecified: Secondary | ICD-10-CM | POA: Diagnosis not present

## 2024-03-10 DIAGNOSIS — K219 Gastro-esophageal reflux disease without esophagitis: Secondary | ICD-10-CM | POA: Diagnosis not present

## 2024-03-10 DIAGNOSIS — E871 Hypo-osmolality and hyponatremia: Secondary | ICD-10-CM | POA: Diagnosis not present

## 2024-03-10 DIAGNOSIS — F331 Major depressive disorder, recurrent, moderate: Secondary | ICD-10-CM | POA: Diagnosis not present

## 2024-03-10 DIAGNOSIS — I428 Other cardiomyopathies: Secondary | ICD-10-CM | POA: Diagnosis not present

## 2024-03-10 DIAGNOSIS — I340 Nonrheumatic mitral (valve) insufficiency: Secondary | ICD-10-CM | POA: Diagnosis not present

## 2024-03-10 DIAGNOSIS — Z79899 Other long term (current) drug therapy: Secondary | ICD-10-CM | POA: Diagnosis not present

## 2024-03-10 DIAGNOSIS — Z9889 Other specified postprocedural states: Secondary | ICD-10-CM | POA: Diagnosis not present

## 2024-03-10 DIAGNOSIS — I4581 Long QT syndrome: Secondary | ICD-10-CM | POA: Diagnosis not present

## 2024-03-10 DIAGNOSIS — R5381 Other malaise: Secondary | ICD-10-CM | POA: Diagnosis not present

## 2024-03-10 DIAGNOSIS — R9431 Abnormal electrocardiogram [ECG] [EKG]: Secondary | ICD-10-CM | POA: Diagnosis not present

## 2024-03-10 DIAGNOSIS — Z7401 Bed confinement status: Secondary | ICD-10-CM | POA: Diagnosis not present

## 2024-03-10 DIAGNOSIS — Z48812 Encounter for surgical aftercare following surgery on the circulatory system: Secondary | ICD-10-CM | POA: Diagnosis not present

## 2024-03-10 DIAGNOSIS — Z7901 Long term (current) use of anticoagulants: Secondary | ICD-10-CM | POA: Diagnosis not present

## 2024-03-10 DIAGNOSIS — R531 Weakness: Secondary | ICD-10-CM | POA: Diagnosis not present

## 2024-03-10 DIAGNOSIS — I110 Hypertensive heart disease with heart failure: Secondary | ICD-10-CM | POA: Diagnosis not present

## 2024-03-10 DIAGNOSIS — D509 Iron deficiency anemia, unspecified: Secondary | ICD-10-CM | POA: Diagnosis not present

## 2024-03-10 DIAGNOSIS — I4819 Other persistent atrial fibrillation: Secondary | ICD-10-CM | POA: Diagnosis not present

## 2024-03-10 DIAGNOSIS — R1314 Dysphagia, pharyngoesophageal phase: Secondary | ICD-10-CM | POA: Diagnosis not present

## 2024-03-10 DIAGNOSIS — Z952 Presence of prosthetic heart valve: Secondary | ICD-10-CM | POA: Diagnosis not present

## 2024-03-10 DIAGNOSIS — K5900 Constipation, unspecified: Secondary | ICD-10-CM | POA: Diagnosis not present

## 2024-03-10 DIAGNOSIS — I4891 Unspecified atrial fibrillation: Secondary | ICD-10-CM | POA: Diagnosis not present

## 2024-03-10 DIAGNOSIS — F339 Major depressive disorder, recurrent, unspecified: Secondary | ICD-10-CM | POA: Diagnosis not present

## 2024-03-10 DIAGNOSIS — I480 Paroxysmal atrial fibrillation: Secondary | ICD-10-CM | POA: Diagnosis not present

## 2024-03-10 LAB — BASIC METABOLIC PANEL WITH GFR
Anion gap: 10 (ref 5–15)
BUN: 16 mg/dL (ref 8–23)
CO2: 27 mmol/L (ref 22–32)
Calcium: 7.9 mg/dL — ABNORMAL LOW (ref 8.9–10.3)
Chloride: 93 mmol/L — ABNORMAL LOW (ref 98–111)
Creatinine, Ser: 1.61 mg/dL — ABNORMAL HIGH (ref 0.44–1.00)
GFR, Estimated: 32 mL/min — ABNORMAL LOW (ref >60)
Glucose, Bld: 103 mg/dL — ABNORMAL HIGH (ref 70–99)
Potassium: 3.8 mmol/L (ref 3.5–5.1)
Sodium: 130 mmol/L — ABNORMAL LOW (ref 135–145)

## 2024-03-10 LAB — MAGNESIUM: Magnesium: 2.2 mg/dL (ref 1.7–2.4)

## 2024-03-10 MED ORDER — APIXABAN 5 MG PO TABS: 5.0000 mg | ORAL_TABLET | Freq: Two times a day (BID) | ORAL | Status: DC

## 2024-03-10 MED ORDER — TORSEMIDE 40 MG PO TABS: 40.0000 mg | ORAL_TABLET | Freq: Two times a day (BID) | ORAL | Status: DC

## 2024-03-10 MED ORDER — METOLAZONE 2.5 MG PO TABS: 2.5000 mg | ORAL_TABLET | ORAL | Status: DC

## 2024-03-10 MED ORDER — POTASSIUM CHLORIDE CRYS ER 20 MEQ PO TBCR
20.0000 meq | EXTENDED_RELEASE_TABLET | Freq: Every day | ORAL | Status: DC
Start: 2024-03-10 — End: 2024-03-13

## 2024-03-10 MED ORDER — POTASSIUM CHLORIDE CRYS ER 20 MEQ PO TBCR: 40.0000 meq | EXTENDED_RELEASE_TABLET | ORAL | Status: DC

## 2024-03-10 NOTE — Progress Notes (Addendum)
      [address]       [place] [address]             [phone]       31 Days Post-Op Procedure(s) (LRB): MITRAL VALVE REPAIR USING A SIMULUS SEMI-RIGID ANNULOPLASTY BAND (N/A) MAZE PROCEDURE (N/A) CLIPPING, LEFT ATRIAL APPENDAGE USING A MEDTRONIC CLIP (N/A) ECHOCARDIOGRAM, TRANSESOPHAGEAL (N/A)  Subjective:  Patient sitting up in chair.  Feels well.  No concerns.  She is eager to transition to [facility] SN[facility] today.  Objective: Vital signs in last 24 hours: Temp:  [97.6 F (36.4 C)-98.3 F (36.8 C)] 97.6 F (36.4 C) (05/10 0824) Pulse Rate:  [72-83] 76 (05/10 0824) Cardiac Rhythm: Normal sinus rhythm (05/09 2100) Resp:  [15-20] 15 (05/10 0824) BP: (92-114)/(48-65) 92/48 (05/10 0824) SpO2:  [90 %-97 %] 97 % (05/10 0824) Weight:  [63.2 kg] 63.2 kg (05/10 0315)  Hemodynamic parameters for last 24 hours: CVP:  [4 mmHg-12 mmHg] 5 mmHg  Intake/Output from previous day: 05/09 0701 - 05/10 0700 In: 620 [P.O.:620] Out: 4050 [Urine:4050]  General appearance: alert, cooperative, and no distress Heart: regular rate and rhythm.  Monitor shows normal sinus rhythm with occasional bigeminal PACs. Lungs: Normal work of breathing, breath sounds are clear.  Stable oxygen saturations on room air. Abdomen: soft, non-tender; Extremities: moderate peripheral edema, una boots remain in place Wound: clean and dry  Lab Results: No results for input(s): "WBC", "HGB", "HCT", "PLT" in the last 72 hours. BMET:  Recent Labs    03/09/24 0500 03/10/24 0300  NA 130* 130*  K 3.4* 3.8  CL 95* 93*  CO2 25 27  GLUCOSE 96 103*  BUN 16 16  CREATININE 1.43* 1.61*  CALCIUM  7.7* 7.9*    PT/INR: No results for input(s): "LABPROT", "INR" in the last 72 hours. ABG    Component Value Date/Time   PHART 7.367 02/08/2024 1849   HCO3 23.9 02/08/2024 1849   TCO2 25 02/08/2024 1849   ACIDBASEDEF 2.0 02/08/2024 1849   O2SAT 82.9 02/28/2024 0447   CBG (last 3)  No  results for input(s): "GLUCAP" in the last 72 hours.  Assessment/Plan: S/P Procedure(s) (LRB): MITRAL VALVE REPAIR USING A SIMULUS SEMI-RIGID ANNULOPLASTY BAND (N/A) MAZE PROCEDURE (N/A) CLIPPING, LEFT ATRIAL APPENDAGE USING A MEDTRONIC CLIP (N/A) ECHOCARDIOGRAM, TRANSESOPHAGEAL (N/A)  CV- maintaining NSR- on Amiodarone  200 mg BID, Eliquis  for stroke Pulm- no acute issues, off oxygen, continue IS Acute Systolic Heart Failure, Non-Ischemic Cardiomyopathy- on Farxiga , Aldactone .  Appreciate management and medication recommendations by advanced heart failure. Renal- creatinine reasonably stable at 1.6.Aaron AasAaron Aas diuretics per AHF, good response to aggressive diuresis over the past few days.  Hypokalemia- at 3.8 continue KCl replacement 80 meq daily for now GU- E.Coli UTI, treated with  Fosfomycin Deconditioning- PT/OT recs SNF, this has been arranged  Dispo-  d/c to SNF today on medications as recommended by the advanced heart failure team.  Cardiology follow-up planned for late next week.  CT surgery follow-up in 2 weeks.   LOS: 31 days    Leata Providence, PA-C 03/10/2024 Patient seen and examined, agree with findings and plan outlined above To SNF today F/u with AHF clinic Tuesday  Alexy Bringle C. Luna Salinas, MD [facility] [phone]

## 2024-03-10 NOTE — TOC Transition Note (Signed)
 Transition of Care [facility]) - Discharge Note   Patient Details  Name: [name] MRN: [id number] Date of Birth: 06/24/1941  Transition of Care [facility]) CM/SW Contact:  Jarrell Merritts, LCSW Phone Number: 03/10/2024, 11:24 AM   Clinical Narrative:     Patient will DC to:?[facility] Anticipated DC date:?03/10/2024 Family notified:?Spouse Transport MW:UXLK   Per MD patient ready for DC to [facility] RN, patient, patient's family, and facility notified of DC. Discharge Summary sent to facility. RN given number for report [phone] room [phone] . DC packet on chart. Ambulance transport requested for patient.   CSW signing off.   Georgine Kitchens, [place] [phone]   Final next level of care: Skilled Nursing Facility Barriers to Discharge: Barriers Resolved   Patient Goals and CMS Choice Patient states their goals for this hospitalization and ongoing recovery are:: SNF CMS Medicare.gov Compare Post Acute Care list provided to:: Patient Choice offered to / list presented to : Patient      Discharge Placement              Patient chooses bed at: [facility] Patient to be transferred to facility by: [facility] Name of family member notified: [name]- spouse Patient and family notified of of transfer: 03/10/24  Discharge Plan and Services Additional resources added to the After Visit Summary for   In-house Referral: Clinical Social Work Discharge Planning Services: CM Consult Post Acute Care Choice: Home Health                    HH Arranged: RN, PT, OT [facility] Agency: [facility] Date [facility] Agency Contacted: 02/27/24 Time HH Agency Contacted: 1339 Representative spoke with at [facility] Agency: [name]  Social Drivers of Health (SDOH) Interventions SDOH Screenings   Food Insecurity: No Food Insecurity (02/08/2024)  Housing: Low Risk  (02/08/2024)  Transportation Needs: No Transportation Needs (02/08/2024)  Utilities: Not At Risk (02/08/2024)  Depression (PHQ2-9): Low Risk  (12/20/2023)  Social  Connections: Socially Integrated (02/09/2024)  Tobacco Use: Low Risk  (02/08/2024)     Readmission Risk Interventions     No data to display

## 2024-03-10 NOTE — Progress Notes (Signed)
 AVS papers placed in the discharge packet, PIV removed, CCMD notified, patient is waiting for the [facility].  Hand off report given to the staff of [facility] SNF, notified about the pressure injury on her coccyx, dressing changed.

## 2024-03-10 NOTE — Progress Notes (Signed)
 CVAD removed per protocol per MD order. Manual pressure applied for 5 mins. Vaseline gauze, gauze, and Tegaderm applied over insertion site. No bleeding or swelling noted. Instructed patient to remain in bed for thirty mins. Educated patient about S/S of infection and when to call MD; no heavy lifting or pressure on R side for 24 hours; keep dressing dry and intact for 24 hours. Pt verbalized comprehension.

## 2024-03-10 NOTE — Progress Notes (Signed)
 Mobility Specialist Progress Note   03/10/24 0845  Mobility  Activity Transferred to/from [facility];Ambulated with assistance in hallway  Level of Assistance Contact guard assist, steadying assist  Assistive Device Front wheel walker  Distance Ambulated (ft) 60 ft  Range of Motion/Exercises Active;All extremities  RUE Weight Bearing Per Provider Order NWB  LUE Weight Bearing Per Provider Order NWB  Activity Response Tolerated well  Mobility Specialist Start Time (ACUTE ONLY) 0845  Mobility Specialist Stop Time (ACUTE ONLY) 0915  Mobility Specialist Time Calculation (min) (ACUTE ONLY) 30 min   Patient received in supine, eager to participate. Requested assistance to [facility] prior to ambulation. Completed bed mobility independently, stood and transferred to [facility] at supervision level with good adherence to sternal precautions. Was able to complete pericare independently. Paitnent then ambulated in hallway with min guard with slow steady gait. Returned to room without complaint or incident. Was left at sink to complete bath independently. RN/NT notified.  [name], BS EXP Mobility Specialist Please contact via SecureChat or Rehab office at [phone]

## 2024-03-12 ENCOUNTER — Telehealth (HOSPITAL_COMMUNITY): Payer: Self-pay

## 2024-03-12 NOTE — Progress Notes (Signed)
 ADVANCED HF CLINIC CONSULT NOTE  Referring Physician: [name], MD Primary Care: [name], MD Primary Cardiologist: [name], MD   HPI: [name] is a 83 y.o. female with HTN and PAF.    Admitted 2/24 for further MR workup. She underwent L/RHC which showed [facility] 2/25 with no CAD. RA 8, PA 41/17 (14), Fick CO/CI 7.76/4.41, PAPi 3, LVEDP 11. Following her heart cath she went into a fib RVR, asymptomatic but new. TEE confirmed severe MR from posterior leaflet prolapse/flail and mod TR with EF 60%. Was initially set up for DCCV but chemically converted and was able to transition to PO amiodarone . Discharged with plans for MVR the following month with Dr. [name].    Admitted 4/9 for MVR, MAZE and LAA clipping. Intra-Op TEE with EF 55-60% and severe MR. Post-op course complicated by persistent a fib RVR and vasoplegic shock requiring vasopressors. Able to extubate the following day. Pressors weaned off.  AHF team consulted after echo showed new stop in EF to 35-40% with persistent a fib and volume overload.    Patient sitting in chair on exam and husband at bedside. In a fib RVR with rates steady in the 120s. Had gotten amiodarone  boluses with no break in a fib. Denies CP/SOB.   - Echo yesterday (02/14/24) with EF 35-40%, LV with GHK, RV normal, LA mod dilated, RA severely dilated, trivial MR, mod TR    Past Medical History:  Diagnosis Date   Acid reflux    Anemia    Arthritis    Atrial fibrillation (HCC)    Cataract    Diverticulosis of colon    Endometrial cancer (HCC)    1A grade endometrial ca   Essential hypertension 01/24/2009   Qualifier: Diagnosis of  By: Nelson-Smith CMA (AAMA), Dottie     Hx of radiation therapy 04/24/08-05/29/08& 8/12/,8/19,&06/27/08   external beam  through 7/09 ,then intracavity in 06/2008   Hypertension    Mitral valve prolapse    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   Tricuspid regurgitation     Current Outpatient Medications   Medication Sig Dispense Refill   acetaminophen  (TYLENOL ) 500 MG tablet Take 1-2 tablets (500-1,000 mg total) by mouth every 6 (six) hours as needed for mild pain (pain score 1-3) or fever.     amiodarone  (PACERONE ) 200 MG tablet Take 1 tablet (200 mg total) by mouth 2 (two) times daily. X 14 days, then decrease to 200 mg daily     apixaban  (ELIQUIS ) 5 MG TABS tablet Take 1 tablet (5 mg total) by mouth 2 (two) times daily.     atorvastatin  (LIPITOR) 10 MG tablet Take 1 tablet (10 mg total) by mouth daily. (Patient not taking: Reported on 01/25/2024) 90 tablet 3   b complex vitamins tablet Take 1 tablet by mouth daily.       CALCIUM  PO Take 650 mg by mouth daily.     Cholecalciferol (VITAMIN D3) 125 MCG (5000 UT) CAPS Take 5,000 Units by mouth daily.     docusate sodium  (COLACE) 100 MG capsule Take 1 capsule (100 mg total) by mouth 2 (two) times daily as needed for moderate constipation.     ezetimibe  (ZETIA ) 10 MG tablet Take 1 tablet (10 mg total) by mouth daily. (Patient not taking: Reported on 01/25/2024) 90 tablet 3   feeding supplement (ENSURE ENLIVE / ENSURE PLUS) LIQD Take 237 mLs by mouth 2 (two) times daily between meals.     loperamide  (IMODIUM ) 2 MG capsule Take  1 capsule (2 mg total) by mouth 4 (four) times daily as needed for diarrhea or loose stools. 12 capsule 0   magnesium  gluconate (MAGONATE) 500 (27 Mg) MG TABS tablet Take 1 tablet (500 mg total) by mouth daily.     melatonin 3 MG TABS tablet Take 1 tablet (3 mg total) by mouth at bedtime.     metolazone  (ZAROXOLYN ) 2.5 MG tablet Take 1 tablet (2.5 mg total) by mouth 2 (two) times a week. On Mondays and Fridays     Multiple Vitamins-Minerals (MULTIVITAMINS THER. W/MINERALS) TABS Take 1 tablet by mouth daily.       oxyCODONE  (OXY IR/ROXICODONE ) 5 MG immediate release tablet Take 1 tablet (5 mg total) by mouth every 4 (four) hours as needed for severe pain (pain score 7-10). 30 tablet 0   pantoprazole  (PROTONIX ) 40 MG tablet Take 40  mg by mouth daily.     phenol (CHLORASEPTIC) 1.4 % LIQD Use as directed 1 spray in the mouth or throat as needed for throat irritation / pain.     potassium chloride  SA (KLOR-CON  M) 20 MEQ tablet Take 1 tablet (20 mEq total) by mouth daily.     potassium chloride  SA (KLOR-CON  M) 20 MEQ tablet Take 2 tablets (40 mEq total) by mouth 2 (two) times a week. On Mondays and Fridays when taking metolazone .     psyllium (HYDROCIL/METAMUCIL) 95 % PACK Take 1 packet by mouth daily.     spironolactone  (ALDACTONE ) 25 MG tablet Take 1 tablet (25 mg total) by mouth daily.     torsemide  40 MG TABS Take 40 mg by mouth 2 (two) times daily.     witch hazel-glycerin  (TUCKS) pad Apply topically as needed for itching.     Zinc 50 MG TABS Take 50 mg by mouth daily.     No current facility-administered medications for this visit.    Allergies  Allergen Reactions   Crestor [Rosuvastatin] Other (See Comments)    Leg cramps   Glycopyrrolate      Other reaction(s): extreme dry mouth   Codeine Other (See Comments)    Hyper      Social History   Socioeconomic History   Marital status: Married    Spouse name: Not on file   Number of children: 1   Years of education: Not on file   Highest education level: Not on file  Occupational History   Occupation: Environmental health practitioner  Tobacco Use   Smoking status: Never   Smokeless tobacco: Never  Vaping Use   Vaping status: Never Used  Substance and Sexual Activity   Alcohol use: Yes    Comment: rarely   Drug use: No   Sexual activity: Never  Other Topics Concern   Not on file  Social History Narrative   Not on file   Social Drivers of Health   Financial Resource Strain: Not on file  Food Insecurity: No Food Insecurity (02/08/2024)   Hunger Vital Sign    Worried About Running Out of Food in the Last Year: Never true    Ran Out of Food in the Last Year: Never true  Transportation Needs: No Transportation Needs (02/08/2024)   PRAPARE - Therapist, art (Medical): No    Lack of Transportation (Non-Medical): No  Physical Activity: Not on file  Stress: Not on file  Social Connections: Socially Integrated (02/09/2024)   Social Connection and Isolation Panel [NHANES]    Frequency of Communication with Friends and Family: More  than three times a week    Frequency of Social Gatherings with Friends and Family: More than three times a week    Attends Religious Services: More than 4 times per year    Active Member of [employer] or Organizations: Yes    Attends Engineer, structural: More than 4 times per year    Marital Status: Married  Catering manager Violence: Not At Risk (02/08/2024)   Humiliation, Afraid, Rape, and Kick questionnaire    Fear of Current or Ex-Partner: No    Emotionally Abused: No    Physically Abused: No    Sexually Abused: No      Family History  Problem Relation Age of Onset   Diabetes type II Mother    Breast cancer Mother    Leukemia Father    Diabetes type II Sister    Breast cancer Sister    Cancer Brother 42       throat 10/2020   Pancreatic cancer Maternal Aunt        x2   Diabetes Maternal Grandmother    Diabetes Maternal Grandfather    Colon cancer Neg Hx    Stomach cancer Neg Hx    Esophageal cancer Neg Hx     There were no vitals filed for this visit.  PHYSICAL EXAM: General:  NAD. No resp difficulty HEENT: Normal Neck: Supple. No JVD. Cor: Regular rate & rhythm. No rubs, gallops or murmurs. Lungs: Clear Abdomen: Soft, nontender, nondistended.  Extremities: No cyanosis, clubbing, rash, edema Neuro: Alert & oriented x 3, moves all 4 extremities w/o difficulty. Affect pleasant.  ECG:   ASSESSMENT & PLAN: Acute systolic heart failure, NiCM - Intra-op TEE 02/08/24 with EF 55-60% and severe MR - [facility] 2/25 with no CAD. RA 8, PA 41/17 (14), Fick CO/CI 7.76/4.41, PAPi 3, LVEDP 11 - Echo 4/25 with EF 35-40%, LV with GHK, RV normal, LA mod dilated, RA severely dilated,  trivial MR, mod TR - Suspect drop in EF 2/2 persistent AF RVR.  - Diuresing well. Volume status improving. CVP 12 today. Still w/ trace-1+ b/LEE on exam  - Give 1 dose of IV Lasix  80 mg this morning + 5 mg of metolazone . If good UOP, can possibly d/c to SNF for rehab this afternoon  - Now off Farxiga  given UTI  - Continue spiro 25 mg daily - Continue UNNA boots  - additional GDMT limited by soft BP    Atrial fibrillation with RVR - S/p Maze and LAA clipping 4/25 - Failed DCCV X 2 4/17 - In/out of Afib this admit. Remains in NSR on tele, HR 60s  - Continue amiodarone  200 bid  - On Eliquis  2.5 mg bid (reduced dose given age and SCr). Monitor SCr closely, if remains < 1.4, after diuresis, will need to increase to 5 mg bid    Severe MR s/p MVR - Intra-op TEE with severe MR 02/08/24 - Trivial MR on echo 02/14/24   4. AKI - baseline Cr normal - Cr up slightly, 1.2>1.35>1.41>1.57>1.6>1.5>1.67>>1.40>>1.43 today  - diuresis as above   5. Hypokalemia - 3.4 today  - continue spiro - give K supp w/ IV Lasix  today. D/w pharmD  - repeat BMP this afternoon       6. Hypervolemic hyponatremia - Felt to be multifactorial, volume overload, component of SIADH - Nephrology added Ure-Na. Holding off on hypertonic saline as low as Na continues to improve - diuretics per above    7. Left pleural effusion - S/p left  thoracentesis on 4/28   8. Debility - Continue aggressive PT/OT - Planning for discharge to SNF   9. UTI - UA+. UCx + E coli  - asymptomatic but given new MV, she was given 1x dose of Fosfomycin    Dr. Bensimhon to assess this afternoon. If volume status improved, can likely be discharged to SNF for rehab later today. Will leave final meds recs if cleared to go. F/u has been arranged. Appt info in AVS.      Cardiac Meds for Discharge  Torsemide  40 bid Metolazone  2.5 mg Q Mondays and  Fridays  KCl 20 mEq daily w/ extra 40 mEq on metolazone  days  Amiodarone  200 mg bid Eliquis  5  mg bid Lipitor 10 mg daily  Zetia  10 mg  Spironolactone  25 mg daily

## 2024-03-12 NOTE — Progress Notes (Signed)
 [facility] Liaison Note  03/12/2024  AMARRIA MINETTE 04/02/1941 [id number]  Location: RN Hospital Liaison screened the patient remotely at [facility].  Insurance: [facility] HMO   RAIANA MANE is a 83 y.o. female who is a Primary Care Patient of [name], MD [facility]).The patient was screened for readmission hospitalization with noted extreme risk score for unplanned readmission risk with 2 IP in 6 months.  The patient was assessed for potential Care Management service needs for post hospital transition for care coordination. Review of patient's electronic medical record reveals patient s/p mitral valve repair. Pt will transition to [facility] for ongoing rehabilitation. This facility will continue to address pt's needs.   [facility] Care Management/Population Health does not replace or interfere with any arrangements made by the Inpatient Transition of Care team.   For questions contact:   Lilla Reichert, RN, BSN Hospital Liaison [place]   [facility], Population Health Office Hours MTWF  8:00 am-6:00 pm Direct Dial: [phone] mobile [email or web address]

## 2024-03-12 NOTE — Telephone Encounter (Signed)
 Called to confirm/remind patient of their appointment at the [facility] Clinic on 03/13/2024.   Appointment:   [x] Confirmed    Patient reminded to bring all medications and/or complete list.  Confirmed patient has transportation. Gave directions, instructed to utilize valet parking.

## 2024-03-13 ENCOUNTER — Ambulatory Visit (HOSPITAL_COMMUNITY)
Admit: 2024-03-13 | Discharge: 2024-03-13 | Disposition: A | Discharge status: Home / Self Care | Attending: Family Medicine | Admitting: Family Medicine

## 2024-03-13 ENCOUNTER — Ambulatory Visit (HOSPITAL_COMMUNITY): Payer: Self-pay | Admitting: Family Medicine

## 2024-03-13 ENCOUNTER — Encounter (HOSPITAL_COMMUNITY): Payer: Self-pay

## 2024-03-13 ENCOUNTER — Telehealth: Payer: Self-pay | Admitting: Cardiology

## 2024-03-13 VITALS — BP 118/72 | HR 97 | Wt 131.4 lb

## 2024-03-13 DIAGNOSIS — I480 Paroxysmal atrial fibrillation: Secondary | ICD-10-CM

## 2024-03-13 DIAGNOSIS — I340 Nonrheumatic mitral (valve) insufficiency: Secondary | ICD-10-CM

## 2024-03-13 DIAGNOSIS — I428 Other cardiomyopathies: Secondary | ICD-10-CM | POA: Diagnosis not present

## 2024-03-13 DIAGNOSIS — Z79899 Other long term (current) drug therapy: Secondary | ICD-10-CM | POA: Insufficient documentation

## 2024-03-13 DIAGNOSIS — R5381 Other malaise: Secondary | ICD-10-CM | POA: Insufficient documentation

## 2024-03-13 DIAGNOSIS — I110 Hypertensive heart disease with heart failure: Secondary | ICD-10-CM | POA: Diagnosis not present

## 2024-03-13 DIAGNOSIS — I5022 Chronic systolic (congestive) heart failure: Secondary | ICD-10-CM | POA: Diagnosis not present

## 2024-03-13 DIAGNOSIS — I081 Rheumatic disorders of both mitral and tricuspid valves: Secondary | ICD-10-CM | POA: Diagnosis not present

## 2024-03-13 DIAGNOSIS — Z7901 Long term (current) use of anticoagulants: Secondary | ICD-10-CM | POA: Insufficient documentation

## 2024-03-13 DIAGNOSIS — R9431 Abnormal electrocardiogram [ECG] [EKG]: Secondary | ICD-10-CM

## 2024-03-13 DIAGNOSIS — J90 Pleural effusion, not elsewhere classified: Secondary | ICD-10-CM

## 2024-03-13 DIAGNOSIS — I4819 Other persistent atrial fibrillation: Secondary | ICD-10-CM | POA: Insufficient documentation

## 2024-03-13 DIAGNOSIS — Z952 Presence of prosthetic heart valve: Secondary | ICD-10-CM | POA: Insufficient documentation

## 2024-03-13 DIAGNOSIS — E871 Hypo-osmolality and hyponatremia: Secondary | ICD-10-CM | POA: Insufficient documentation

## 2024-03-13 DIAGNOSIS — I4581 Long QT syndrome: Secondary | ICD-10-CM | POA: Insufficient documentation

## 2024-03-13 LAB — CBC
HCT: 33.6 % — ABNORMAL LOW (ref 36.0–46.0)
Hemoglobin: 11 g/dL — ABNORMAL LOW (ref 12.0–15.0)
MCH: 28.4 pg (ref 26.0–34.0)
MCHC: 32.7 g/dL (ref 30.0–36.0)
MCV: 86.8 fL (ref 80.0–100.0)
Platelets: 334 10*3/uL (ref 150–400)
RBC: 3.87 MIL/uL (ref 3.87–5.11)
RDW: 15.6 % — ABNORMAL HIGH (ref 11.5–15.5)
WBC: 14.9 10*3/uL — ABNORMAL HIGH (ref 4.0–10.5)
nRBC: 0 % (ref 0.0–0.2)

## 2024-03-13 LAB — BASIC METABOLIC PANEL WITH GFR
Anion gap: 15 (ref 5–15)
BUN: 27 mg/dL — ABNORMAL HIGH (ref 8–23)
CO2: 27 mmol/L (ref 22–32)
Calcium: 8.4 mg/dL — ABNORMAL LOW (ref 8.9–10.3)
Chloride: 87 mmol/L — ABNORMAL LOW (ref 98–111)
Creatinine, Ser: 2.29 mg/dL — ABNORMAL HIGH (ref 0.44–1.00)
GFR, Estimated: 21 mL/min — ABNORMAL LOW (ref >60)
Glucose, Bld: 108 mg/dL — ABNORMAL HIGH (ref 70–99)
Potassium: 2.9 mmol/L — ABNORMAL LOW (ref 3.5–5.1)
Sodium: 129 mmol/L — ABNORMAL LOW (ref 135–145)

## 2024-03-13 LAB — MAGNESIUM: Magnesium: 2.2 mg/dL (ref 1.7–2.4)

## 2024-03-13 LAB — BRAIN NATRIURETIC PEPTIDE: B Natriuretic Peptide: 216.2 pg/mL — ABNORMAL HIGH (ref 0.0–100.0)

## 2024-03-13 MED ORDER — POTASSIUM CHLORIDE CRYS ER 20 MEQ PO TBCR: 40.0000 meq | EXTENDED_RELEASE_TABLET | Freq: Every day | ORAL | Status: DC

## 2024-03-13 NOTE — Telephone Encounter (Addendum)
 Spoke to [place] at [facility] facility about low potassium. Potassium increased to 40 meq daily Lab work Nutritional therapist in a week. Orders faxed to facility  ----- Message from Elmarie Hacking sent at 03/13/2024  1:05 PM EDT ----- Labs show decreased renal function, likely volume depleted. Diuretics adjusted at today's visit. Will re-check her labs at her follow up  K is low, increase KCL to 40 daily. She is at [facility]  WBC elevated, needs to watch for S/S of infection

## 2024-03-13 NOTE — Progress Notes (Signed)
 ReDS Vest / Clip - 03/13/24 1100       ReDS Vest / Clip   Station Marker A    Ruler Value 23    ReDS Value Range Low volume    ReDS Actual Value 21

## 2024-03-13 NOTE — Telephone Encounter (Signed)
 Patient discharged on May 10 and saw HF clinic today.  Reviewed with Dr Berry Bristol and patient does not need office visit with him on May 15.  He will see patient back once HF clinic no longer following patient.  Patient made aware

## 2024-03-13 NOTE — Telephone Encounter (Signed)
-----   Message from [name] sent at 03/13/2024  1:05 PM EDT ----- Labs show decreased renal function, likely volume depleted. Diuretics adjusted at today's visit. Will re-check her labs at her follow up  K is low, increase KCL to 40 daily. She is at [facility].  WBC elevated, needs to watch for S/S of infection

## 2024-03-13 NOTE — Patient Instructions (Addendum)
 Good to see you today!  STOP Metolazone   and Immodium  Labs done today, your results will be available in MyChart, we will contact you for abnormal readings. Your physician has requested that you have an echocardiogram. Echocardiography is a painless test that uses sound waves to create images of your heart. It provides your doctor with information about the size and shape of your heart and how well your heart's chambers and valves are working. This procedure takes approximately one hour. There are no restrictions for this procedure. Please do NOT wear cologne, perfume, aftershave, or lotions (deodorant is allowed). Please arrive 15 minutes prior to your appointment time.  Please note: We ask at that you not bring children with you during ultrasound (echo/ vascular) testing. Due to room size and safety concerns, children are not allowed in the ultrasound rooms during exams. Our front office staff cannot provide observation of children in our lobby area while testing is being conducted. An adult accompanying a patient to their appointment will only be allowed in the ultrasound room at the discretion of the ultrasound technician under special circumstances. We apologize for any inconvenience.  Your physician recommends that you schedule a follow-up appointment:as scheduled  Ok To remove unna boots and place compression hose- on in the am  and off in the evening  If you have any questions or concerns before your next appointment please send us  a message through [place] or call our office at [phone].    TO LEAVE A MESSAGE FOR THE NURSE SELECT OPTION 2, PLEASE LEAVE A MESSAGE INCLUDING: YOUR NAME DATE OF BIRTH CALL BACK NUMBER REASON FOR CALL**this is important as we prioritize the call backs  YOU WILL RECEIVE A CALL BACK THE SAME DAY AS LONG AS YOU CALL BEFORE 4:00 PM At the [facility], you and your health needs are our priority. As part of our continuing mission to  provide you with exceptional heart care, we have created designated Provider Care Teams. These Care Teams include your primary Cardiologist (physician) and Advanced Practice Providers (APPs- Physician Assistants and Nurse Practitioners) who all work together to provide you with the care you need, when you need it.   You may see any of the following providers on your designated Care Team at your next follow up: Dr Jules Oar Dr Peder Bourdon Dr. Alwin Baars Dr. Arta Lark Amy Marijane Shoulders, NP Ruddy Corral, [place] [facility] [place], [place] Dennise Fitz, NP [place] Lee, NP Shawnee Dellen, NP Luster Salters, PharmD Bevely Brush, PharmD   Please be sure to bring in all your medications bottles to every appointment.    Thank you for choosing [facility]

## 2024-03-13 NOTE — Telephone Encounter (Signed)
 Left voicemail to return call to office

## 2024-03-13 NOTE — Telephone Encounter (Signed)
 Pt would like advice on what she could do to help with insomnia.

## 2024-03-13 NOTE — Telephone Encounter (Signed)
 Reviewed with Dr Berry Bristol and he feels trazodone  would be OK for patient to take.  Patient notified.  She is currently at [facility] and will check with doctors at [facility] to see if they can order this

## 2024-03-14 ENCOUNTER — Telehealth (HOSPITAL_COMMUNITY): Payer: Self-pay

## 2024-03-14 NOTE — Telephone Encounter (Signed)
 Orders faxed to [facility] facility. Confirmation received.

## 2024-03-15 ENCOUNTER — Ambulatory Visit: Admitting: Cardiology

## 2024-03-15 DIAGNOSIS — D649 Anemia, unspecified: Secondary | ICD-10-CM | POA: Diagnosis not present

## 2024-03-15 DIAGNOSIS — I10 Essential (primary) hypertension: Secondary | ICD-10-CM | POA: Diagnosis not present

## 2024-03-15 DIAGNOSIS — L89150 Pressure ulcer of sacral region, unstageable: Secondary | ICD-10-CM | POA: Diagnosis not present

## 2024-03-15 DIAGNOSIS — D509 Iron deficiency anemia, unspecified: Secondary | ICD-10-CM | POA: Diagnosis not present

## 2024-03-15 DIAGNOSIS — I480 Paroxysmal atrial fibrillation: Secondary | ICD-10-CM | POA: Diagnosis not present

## 2024-03-15 DIAGNOSIS — I340 Nonrheumatic mitral (valve) insufficiency: Secondary | ICD-10-CM | POA: Diagnosis not present

## 2024-03-15 DIAGNOSIS — Z9889 Other specified postprocedural states: Secondary | ICD-10-CM | POA: Diagnosis not present

## 2024-03-16 ENCOUNTER — Telehealth (HOSPITAL_COMMUNITY): Payer: Self-pay

## 2024-03-16 NOTE — Telephone Encounter (Signed)
 Pt insurance is active and benefits verified through [facility] Co-pay $25, DED 0/0 met, out of pocket $6,750/$1,162.35 met, co-insurance 0%. no pre-authorization required. Passport, 03/16/2024@2 :50, REF# [id number]   How many CR sessions are covered? (ICR)72 Is this a lifetime maximum or an annual maximum? annual Has the member used any of these services to date? no Is there a time limit (weeks/months) on start of program and/or program completion? no     Will contact patient to see if she is interested in the Cardiac Rehab Program. If interested, patient will need to complete follow up appt. Once completed, patient will be contacted for scheduling upon review by the RN Navigator.

## 2024-03-16 NOTE — Telephone Encounter (Signed)
 Called patient to see if she is interested in the Cardiac Rehab Program. Patient expressed interest. Explained scheduling process and went over insurance, patient verbalized understanding. Will contact patient for scheduling once f/u has been completed.   Pt is currently in the SNF program and should be finishing up around 5/30

## 2024-03-19 NOTE — Progress Notes (Unsigned)
 [address]       [name] [address]             [phone]       HPI: [name] is an 83 year old woman with a past medical history of hypertension and paroxysmal atrial fibrillation. The patient returns for routine postoperative follow-up having undergone mitral valve repair, pulmonary vein isolation MAZE with LA clip by Dr. [name] on 02/08/24. The patient's early postoperative recovery while in the hospital was notable for a complicated hospital stay including vasoplegic shock requiring pressors, expected postop ABLA requiring transfusion, severe nausea, atrial fibrillation with RVR with unsuccessful DC-CVx2 but resolved with time and started on Eliquis , urinary retention that resolved with removal of scopolamine  patch, left pleural effusion requiring thoracentesis, and AKI on CKD. She was discharged to SNF in stable condition on 05/10. She saw cardiology on 05/13 and was noted to have continued nausea, lack of appetite and long QT. Amiodarone  was decrease, lactulose  was discontinued, and scheduled metolazone  was discontinued.  Since hospital discharge the patient reports ***.   Current Outpatient Medications  Medication Sig Dispense Refill   acetaminophen  (TYLENOL ) 500 MG tablet Take 1-2 tablets (500-1,000 mg total) by mouth every 6 (six) hours as needed for mild pain (pain score 1-3) or fever.     amiodarone  (PACERONE ) 200 MG tablet Take 1 tablet (200 mg total) by mouth 2 (two) times daily. X 14 days, then decrease to 200 mg daily     apixaban  (ELIQUIS ) 5 MG TABS tablet Take 1 tablet (5 mg total) by mouth 2 (two) times daily.     atorvastatin  (LIPITOR) 10 MG tablet Take 1 tablet (10 mg total) by mouth daily. 90 tablet 3   b complex vitamins tablet Take 1 tablet by mouth daily.       CALCIUM  PO Take 650 mg by mouth daily.     Cholecalciferol (VITAMIN D3) 125 MCG (5000 UT) CAPS Take 5,000 Units by mouth daily.     docusate sodium  (COLACE) 100 MG capsule Take 1  capsule (100 mg total) by mouth 2 (two) times daily as needed for moderate constipation.     ezetimibe  (ZETIA ) 10 MG tablet Take 1 tablet (10 mg total) by mouth daily. 90 tablet 3   feeding supplement (ENSURE ENLIVE / ENSURE PLUS) LIQD Take 237 mLs by mouth 2 (two) times daily between meals.     magnesium  gluconate (MAGONATE) 500 (27 Mg) MG TABS tablet Take 1 tablet (500 mg total) by mouth daily.     melatonin 3 MG TABS tablet Take 1 tablet (3 mg total) by mouth at bedtime.     Multiple Vitamins-Minerals (MULTIVITAMINS THER. W/MINERALS) TABS Take 1 tablet by mouth daily.       oxyCODONE  (OXY IR/ROXICODONE ) 5 MG immediate release tablet Take 1 tablet (5 mg total) by mouth every 4 (four) hours as needed for severe pain (pain score 7-10). 30 tablet 0   pantoprazole  (PROTONIX ) 40 MG tablet Take 40 mg by mouth daily.     phenol (CHLORASEPTIC) 1.4 % LIQD Use as directed 1 spray in the mouth or throat as needed for throat irritation / pain.     potassium chloride  SA (KLOR-CON  M) 20 MEQ tablet Take 2 tablets (40 mEq total) by mouth daily.     psyllium (HYDROCIL/METAMUCIL) 95 % PACK Take 1 packet by mouth daily.     spironolactone  (ALDACTONE ) 25 MG tablet Take 1 tablet (25 mg total) by mouth  daily.     torsemide  40 MG TABS Take 40 mg by mouth 2 (two) times daily.     witch hazel-glycerin  (TUCKS) pad Apply topically as needed for itching.     Zinc 50 MG TABS Take 50 mg by mouth daily.     No current facility-administered medications for this visit.   Vitals:  Physical Exam: *** General Neuro CV Pulm GI Extremities Wound  Diagnostic Tests: ***  Impression/Plan: ***  You are encouraged to enroll and participate in the outpatient cardiac rehab program beginning as soon as practical.  You may return to driving an automobile as long as you are no longer requiring oral narcotic pain relievers during the daytime.  It would be wise to start driving only short distances during the daylight and  gradually increase from there as you feel comfortable.  Endocarditis is a potentially serious infection of heart valves or inside lining of the heart.  It occurs more commonly in patients with diseased heart valves (such as patient's with aortic or mitral valve disease) and in patients who have undergone heart valve repair or replacement.  Certain surgical and dental procedures may put you at risk, such as dental cleaning, other dental procedures, or any surgery involving the respiratory, urinary, gastrointestinal tract, gallbladder or prostate gland.   To minimize your chances for develooping endocarditis, maintain good oral health and seek prompt medical attention for any infections involving the mouth, teeth, gums, skin or urinary tract.    Always notify your doctor or dentist about your underlying heart valve condition before having any invasive procedures. You will need to take antibiotics before certain procedures, including all routine dental cleanings or other dental procedures.  Your cardiologist or dentist should prescribe these antibiotics for you to be taken ahead of time.  Continue to avoid any heavy lifting or strenuous use of your arms or shoulders for at least a total of three months from the time of surgery.  After three months you may gradually increase how much you lift or otherwise use your arms or chest as tolerated, with limits based upon whether or not activities lead to the return of significant discomfort.  Randa Burton, PA-C [facility] [phone]

## 2024-03-20 NOTE — Patient Instructions (Signed)
 You may return to driving an automobile as long as you are no longer requiring oral narcotic pain relievers during the daytime.  It would be wise to start driving only short distances during the daylight and gradually increase from there as you feel comfortable.  You are encouraged to enroll and participate in the outpatient cardiac rehab program beginning as soon as practical.  Continue to avoid any heavy lifting or strenuous use of your arms or shoulders for at least a total of three months from the time of surgery.  After three months you may gradually increase how much you lift or otherwise use your arms or chest as tolerated, with limits based upon whether or not activities lead to the return of significant discomfort.  Endocarditis is a potentially serious infection of heart valves or inside lining of the heart.  It occurs more commonly in patients with diseased heart valves (such as patient's with aortic or mitral valve disease) and in patients who have undergone heart valve repair or replacement.  Certain surgical and dental procedures may put you at risk, such as dental cleaning, other dental procedures, or any surgery involving the respiratory, urinary, gastrointestinal tract, gallbladder or prostate gland.   To minimize your chances for develooping endocarditis, maintain good oral health and seek prompt medical attention for any infections involving the mouth, teeth, gums, skin or urinary tract.    Always notify your doctor or dentist about your underlying heart valve condition before having any invasive procedures. You will need to take antibiotics before certain procedures, including all routine dental cleanings or other dental procedures.  Your cardiologist or dentist should prescribe these antibiotics for you to be taken ahead of time.

## 2024-03-21 ENCOUNTER — Other Ambulatory Visit: Payer: Self-pay | Admitting: Thoracic Surgery (Cardiothoracic Vascular Surgery)

## 2024-03-21 ENCOUNTER — Ambulatory Visit (HOSPITAL_COMMUNITY)
Admission: RE | Admit: 2024-03-21 | Discharge: 2024-03-21 | Disposition: A | Discharge status: Home / Self Care | Source: Ambulatory Visit | Attending: Cardiovascular Disease | Admitting: Cardiovascular Disease

## 2024-03-21 DIAGNOSIS — I340 Nonrheumatic mitral (valve) insufficiency: Secondary | ICD-10-CM | POA: Diagnosis not present

## 2024-03-21 LAB — ECHOCARDIOGRAM COMPLETE
Area-P 1/2: 4.43 cm2
MV VTI: 1.27 cm2
S' Lateral: 3.4 cm

## 2024-03-22 ENCOUNTER — Ambulatory Visit

## 2024-03-27 ENCOUNTER — Ambulatory Visit (HOSPITAL_COMMUNITY)
Admission: RE | Admit: 2024-03-27 | Discharge: 2024-03-27 | Disposition: A | Discharge status: Home / Self Care | Payer: Self-pay | Source: Ambulatory Visit | Attending: Cardiology | Admitting: Cardiology

## 2024-03-27 ENCOUNTER — Ambulatory Visit: Payer: Self-pay | Attending: Thoracic Surgery (Cardiothoracic Vascular Surgery) | Admitting: Physician Assistant

## 2024-03-27 VITALS — BP 109/69 | HR 95 | Resp 18 | Ht 64.0 in | Wt 130.0 lb

## 2024-03-27 DIAGNOSIS — Z9889 Other specified postprocedural states: Secondary | ICD-10-CM

## 2024-03-27 DIAGNOSIS — J90 Pleural effusion, not elsewhere classified: Secondary | ICD-10-CM | POA: Diagnosis not present

## 2024-03-27 DIAGNOSIS — I340 Nonrheumatic mitral (valve) insufficiency: Secondary | ICD-10-CM | POA: Diagnosis present

## 2024-03-27 DIAGNOSIS — Z48812 Encounter for surgical aftercare following surgery on the circulatory system: Secondary | ICD-10-CM | POA: Diagnosis not present

## 2024-03-29 DIAGNOSIS — E785 Hyperlipidemia, unspecified: Secondary | ICD-10-CM | POA: Diagnosis not present

## 2024-03-29 DIAGNOSIS — I272 Pulmonary hypertension, unspecified: Secondary | ICD-10-CM | POA: Diagnosis not present

## 2024-03-29 DIAGNOSIS — I4891 Unspecified atrial fibrillation: Secondary | ICD-10-CM | POA: Diagnosis not present

## 2024-03-29 DIAGNOSIS — I10 Essential (primary) hypertension: Secondary | ICD-10-CM | POA: Diagnosis not present

## 2024-03-29 DIAGNOSIS — I340 Nonrheumatic mitral (valve) insufficiency: Secondary | ICD-10-CM | POA: Diagnosis not present

## 2024-03-29 DIAGNOSIS — K5900 Constipation, unspecified: Secondary | ICD-10-CM | POA: Diagnosis not present

## 2024-03-29 DIAGNOSIS — F331 Major depressive disorder, recurrent, moderate: Secondary | ICD-10-CM | POA: Diagnosis not present

## 2024-03-31 DIAGNOSIS — I10 Essential (primary) hypertension: Secondary | ICD-10-CM | POA: Diagnosis not present

## 2024-03-31 DIAGNOSIS — I2510 Atherosclerotic heart disease of native coronary artery without angina pectoris: Secondary | ICD-10-CM | POA: Diagnosis not present

## 2024-03-31 DIAGNOSIS — G4700 Insomnia, unspecified: Secondary | ICD-10-CM | POA: Diagnosis not present

## 2024-03-31 DIAGNOSIS — I272 Pulmonary hypertension, unspecified: Secondary | ICD-10-CM | POA: Diagnosis not present

## 2024-03-31 DIAGNOSIS — F331 Major depressive disorder, recurrent, moderate: Secondary | ICD-10-CM | POA: Diagnosis not present

## 2024-03-31 DIAGNOSIS — Z48812 Encounter for surgical aftercare following surgery on the circulatory system: Secondary | ICD-10-CM | POA: Diagnosis not present

## 2024-03-31 DIAGNOSIS — L89152 Pressure ulcer of sacral region, stage 2: Secondary | ICD-10-CM | POA: Diagnosis not present

## 2024-03-31 DIAGNOSIS — I4891 Unspecified atrial fibrillation: Secondary | ICD-10-CM | POA: Diagnosis not present

## 2024-03-31 DIAGNOSIS — F419 Anxiety disorder, unspecified: Secondary | ICD-10-CM | POA: Diagnosis not present

## 2024-04-01 DIAGNOSIS — F419 Anxiety disorder, unspecified: Secondary | ICD-10-CM | POA: Diagnosis not present

## 2024-04-01 DIAGNOSIS — I272 Pulmonary hypertension, unspecified: Secondary | ICD-10-CM | POA: Diagnosis not present

## 2024-04-01 DIAGNOSIS — Z48812 Encounter for surgical aftercare following surgery on the circulatory system: Secondary | ICD-10-CM | POA: Diagnosis not present

## 2024-04-01 DIAGNOSIS — I4891 Unspecified atrial fibrillation: Secondary | ICD-10-CM | POA: Diagnosis not present

## 2024-04-01 DIAGNOSIS — G4700 Insomnia, unspecified: Secondary | ICD-10-CM | POA: Diagnosis not present

## 2024-04-01 DIAGNOSIS — I10 Essential (primary) hypertension: Secondary | ICD-10-CM | POA: Diagnosis not present

## 2024-04-01 DIAGNOSIS — L89152 Pressure ulcer of sacral region, stage 2: Secondary | ICD-10-CM | POA: Diagnosis not present

## 2024-04-01 DIAGNOSIS — I2510 Atherosclerotic heart disease of native coronary artery without angina pectoris: Secondary | ICD-10-CM | POA: Diagnosis not present

## 2024-04-01 DIAGNOSIS — F331 Major depressive disorder, recurrent, moderate: Secondary | ICD-10-CM | POA: Diagnosis not present

## 2024-04-02 DIAGNOSIS — R5381 Other malaise: Secondary | ICD-10-CM | POA: Diagnosis not present

## 2024-04-02 DIAGNOSIS — T148XXA Other injury of unspecified body region, initial encounter: Secondary | ICD-10-CM | POA: Diagnosis not present

## 2024-04-02 DIAGNOSIS — Z8679 Personal history of other diseases of the circulatory system: Secondary | ICD-10-CM | POA: Diagnosis not present

## 2024-04-02 DIAGNOSIS — Z7901 Long term (current) use of anticoagulants: Secondary | ICD-10-CM | POA: Diagnosis not present

## 2024-04-02 DIAGNOSIS — Z9889 Other specified postprocedural states: Secondary | ICD-10-CM | POA: Diagnosis not present

## 2024-04-03 DIAGNOSIS — G4700 Insomnia, unspecified: Secondary | ICD-10-CM | POA: Diagnosis not present

## 2024-04-03 DIAGNOSIS — I4891 Unspecified atrial fibrillation: Secondary | ICD-10-CM | POA: Diagnosis not present

## 2024-04-03 DIAGNOSIS — F419 Anxiety disorder, unspecified: Secondary | ICD-10-CM | POA: Diagnosis not present

## 2024-04-03 DIAGNOSIS — F331 Major depressive disorder, recurrent, moderate: Secondary | ICD-10-CM | POA: Diagnosis not present

## 2024-04-03 DIAGNOSIS — I2510 Atherosclerotic heart disease of native coronary artery without angina pectoris: Secondary | ICD-10-CM | POA: Diagnosis not present

## 2024-04-03 DIAGNOSIS — I10 Essential (primary) hypertension: Secondary | ICD-10-CM | POA: Diagnosis not present

## 2024-04-03 DIAGNOSIS — L89152 Pressure ulcer of sacral region, stage 2: Secondary | ICD-10-CM | POA: Diagnosis not present

## 2024-04-03 DIAGNOSIS — Z48812 Encounter for surgical aftercare following surgery on the circulatory system: Secondary | ICD-10-CM | POA: Diagnosis not present

## 2024-04-03 DIAGNOSIS — I272 Pulmonary hypertension, unspecified: Secondary | ICD-10-CM | POA: Diagnosis not present

## 2024-04-04 ENCOUNTER — Telehealth (HOSPITAL_COMMUNITY): Payer: Self-pay

## 2024-04-04 NOTE — Progress Notes (Incomplete)
 ADVANCED HF CLINIC NOTE  Primary Care: Dawn Man, MD Primary Cardiologist: Dawn Perl, MD HF Cardiologist: Dr. Julane [name]  HPI: [name] is an 83 y.o.[name] female with HTN and PAF, MR and systolic heart failure. .    Admitted 2/24 for further MR workup. She underwent L/RHC which showed [facility] 2/25 with no CAD. RA 8, PA 41/17 (14), Fick CO/CI 7.76/4.41, PAPi 3, LVEDP 11. Following her heart cath she went into a fib RVR, asymptomatic but new. TEE confirmed severe MR from posterior leaflet prolapse/flail and mod TR with EF 60%. Was initially set up for DCCV but chemically converted and was able to transition to PO amiodarone . Discharged with plans for MVR the following month with Dr. Honey [name].    Admitted 02/08/24 for MVR, MAZE and LAA clipping. Intra-Op TEE with EF 55-60% and severe MR. Post-op course complicated by persistent a fib RVR and vasoplegic shock requiring vasopressors. Able to extubate the following day. Pressors weaned off.  AHF team consulted after echo showed new drop in EF to 35-40% with persistent a fib and volume overload. Diuresed, able to wean drips and GDMT titrated but limited by soft BP. Underwent L thoracentesis 02/27/24.  Discharged to SNF, weight 145 lbs.  Echo 03/21/24: EF 55-60%, LV no RWMA, RV normal, trivial MR, mod TR.    Today she returns for AHF follow up with her husband. Overall feeling ok just limited with intt SOB. Denies palpitations, CP, dizziness, edema, or PND/Orthopnea. Reports same SOB from discharge but she's able to do ADLs as long as she takes breaks. Appetite ok. No fever or chills. Taking all medications. Denies ETOH, tobacco or drug use. Drinks ~40oz day. Reports easily bleeding with Eliquis  with delayed healing of knick's.   Cardiac Studies - Echo 03/21/24: EF 55-60%, LV no RWMA, RV normal, trivial MR, mod TR.  - Echo (02/14/24): EF 35-40%, LV with GHK, RV normal, LA mod dilated, RA severely dilated, trivial MR, mod TR - Intra-Op TEE 4/25: EF 55-60%,  severe MR - TEE 2/25: LVEF 60-65%, no LAA thrombus, severe MR from posterior leaflet prolapse/flail and moderate TR - R/LHC 2/25: no CAD, RA 8, PA 41/17 (30), PCWP 14, CO/CI (Fick) 7.76/4.41  Past Medical History:  Diagnosis Date   Acid reflux    Anemia    Arthritis    Atrial fibrillation (HCC)    Cataract    Diverticulosis of colon    Endometrial cancer (HCC)    1A grade endometrial ca   Essential hypertension 01/24/2009   Qualifier: Diagnosis of  By: [name]     Hx of radiation therapy 04/24/08-05/29/08& 8/12/,8/19,&06/27/08   external beam  through 7/09 ,then intracavity in 06/2008   Hypertension    Mitral valve prolapse    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   Tricuspid regurgitation    Current Outpatient Medications  Medication Sig Dispense Refill   acetaminophen  (TYLENOL ) 500 MG tablet Take 1-2 tablets (500-1,000 mg total) by mouth every 6 (six) hours as needed for mild pain (pain score 1-3) or fever.     amiodarone  (PACERONE ) 200 MG tablet Take 1 tablet (200 mg total) by mouth 2 (two) times daily. X 14 days, then decrease to 200 mg daily     apixaban  (ELIQUIS ) 5 MG TABS tablet Take 1 tablet (5 mg total) by mouth 2 (two) times daily.     atorvastatin  (LIPITOR) 10 MG tablet Take 1 tablet (10 mg total) by mouth daily. 90 tablet 3   b complex  vitamins tablet Take 1 tablet by mouth daily.       CALCIUM  PO Take 650 mg by mouth daily.     Cholecalciferol (VITAMIN D3) 125 MCG (5000 UT) CAPS Take 5,000 Units by mouth daily.     docusate sodium  (COLACE) 100 MG capsule Take 1 capsule (100 mg total) by mouth 2 (two) times daily as needed for moderate constipation.     ezetimibe  (ZETIA ) 10 MG tablet Take 1 tablet (10 mg total) by mouth daily. 90 tablet 3   magnesium  gluconate (MAGONATE) 500 (27 Mg) MG TABS tablet Take 1 tablet (500 mg total) by mouth daily.     melatonin 3 MG TABS tablet Take 1 tablet (3 mg total) by mouth at bedtime. (Patient taking differently:  Take 10 mg by mouth at bedtime.)     Multiple Vitamins-Minerals (MULTIVITAMINS THER. W/MINERALS) TABS Take 1 tablet by mouth daily.       ondansetron  (ZOFRAN ) 4 MG tablet Take 4 mg by mouth every 6 (six) hours as needed for nausea or vomiting.     pantoprazole  (PROTONIX ) 40 MG tablet Take 40 mg by mouth daily.     phenol (CHLORASEPTIC) 1.4 % LIQD Use as directed 1 spray in the mouth or throat as needed for throat irritation / pain.     potassium chloride  SA (KLOR-CON  M) 20 MEQ tablet Take 2 tablets (40 mEq total) by mouth daily.     psyllium (HYDROCIL/METAMUCIL) 95 % PACK Take 1 packet by mouth daily.     spironolactone  (ALDACTONE ) 25 MG tablet Take 1 tablet (25 mg total) by mouth daily.     torsemide  40 MG TABS Take 40 mg by mouth 2 (two) times daily.     witch hazel-glycerin  (TUCKS) pad Apply topically as needed for itching.     Zinc 50 MG TABS Take 50 mg by mouth daily.     No current facility-administered medications for this encounter.   Allergies  Allergen Reactions   Crestor [Rosuvastatin] Other (See Comments)    Leg cramps   Glycopyrrolate      Other reaction(s): extreme dry mouth   Codeine Other (See Comments)    Hyper   Social History   Socioeconomic History   Marital status: Married    Spouse name: Not on file   Number of children: 1   Years of education: Not on file   Highest education level: Not on file  Occupational History   Occupation: Environmental health practitioner  Tobacco Use   Smoking status: Never   Smokeless tobacco: Never  Vaping Use   Vaping status: Never Used  Substance and Sexual Activity   Alcohol use: Yes    Comment: rarely   Drug use: No   Sexual activity: Never  Other Topics Concern   Not on file  Social History Narrative   Not on file   Social Drivers of Health   Financial Resource Strain: Not on file  Food Insecurity: No Food Insecurity (02/08/2024)   Hunger Vital Sign    Worried About Running Out of Food in the Last Year: Never true    Ran  Out of Food in the Last Year: Never true  Transportation Needs: No Transportation Needs (02/08/2024)   PRAPARE - Administrator, Civil Service (Medical): No    Lack of Transportation (Non-Medical): No  Physical Activity: Not on file  Stress: Not on file  Social Connections: Socially Integrated (02/09/2024)   Social Connection and Isolation Panel [NHANES]    Frequency of  Communication with Friends and Family: More than three times a week    Frequency of Social Gatherings with Friends and Family: More than three times a week    Attends Religious Services: More than 4 times per year    Active Member of [employer] or Organizations: Yes    Attends Banker Meetings: More than 4 times per year    Marital Status: Married  Catering manager Violence: Not At Risk (02/08/2024)   Humiliation, Afraid, Rape, and Kick questionnaire    Fear of Current or Ex-Partner: No    Emotionally Abused: No    Physically Abused: No    Sexually Abused: No   Family History  Problem Relation Age of Onset   Diabetes type II Mother    Breast cancer Mother    Leukemia Father    Diabetes type II Sister    Breast cancer Sister    Cancer Brother 13       throat 10/2020   Pancreatic cancer Maternal Aunt        x2   Diabetes Maternal Grandmother    Diabetes Maternal Grandfather    Colon cancer Neg Hx    Stomach cancer Neg Hx    Esophageal cancer Neg Hx    Wt Readings from Last 3 Encounters:  04/05/24 61.3 kg (135 lb 3.2 oz)  03/27/24 59 kg (130 lb)  03/13/24 59.6 kg (131 lb 6.4 oz)   BP (!) 152/84   Pulse 71   Wt 61.3 kg (135 lb 3.2 oz)   SpO2 93%   BMI 23.21 kg/m   PHYSICAL EXAM: General:  elderly appearing.  No respiratory difficulty. Walked into clinic. Generalized ecchymosis.  Neck: supple. JVD ~8 cm.  Cor: PMI nondisplaced. Regular rate & rhythm. No rubs, gallops or murmurs. Lungs: clear, diminished LLE Extremities: no cyanosis, clubbing, rash, non-pitting BLE edema. + compression  hose Neuro: alert & oriented x 3. Moves all 4 extremities w/o difficulty. Affect pleasant.   Wt Readings from Last 3 Encounters:  04/05/24 61.3 kg (135 lb 3.2 oz)  03/27/24 59 kg (130 lb)  03/13/24 59.6 kg (131 lb 6.4 oz)    ECG (personally reviewed): NSR 71 bpm. QTc 434  ReDs reading: 33%, normal   ASSESSMENT & PLAN: Chronic systolic heart failure, NiCM - R/LHC 2/25 with no CAD. RA 8, PA 41/17 (14), Fick CO/CI 7.76/4.41, PAPi 3, LVEDP 11 - Intra-op TEE 02/08/24 with EF 55-60% and severe MR - Echo 02/14/24: EF 35-40%, LV with GHK, RV normal, LA mod dilated, RA severely dilated, trivial MR, mod TR - Echo 03/21/24: EF 55-60%, LV no RWMA, RV normal, trivial MR, mod TR.  - Suspect drop in EF 2/2 persistent AF RVR.  - NYHA IIIa-IIIb, functional status confounded by deconditioning. Appears volume overloaded on exam but ReDs clip WNL. Weight up ~5lbs.  - Increase torsemide  40 mg bid> 60am/40pm + 40 KCL daily. Will have her take metolazone  2.5 mg x1 + additional 40 mEq KDUR - Continue spiro 25 mg daily - Will hold off on starting BB at this time while we diurese. Will arrange close f/u to try to start then.  - Off SGLT2i with UTI - Continue compression hose.  - Labs today. - Repeat echo in 3 months   Atrial fibrillation  - S/p Maze and LAA clipping 4/25 - Failed DCCV X 2 02/16/24 - NSR on ECG today. - Continue amiodarone  200 mg daily. - Continue Eliquis  2.5 mg bid (reduced dose given age and SCr).  Would not increase back to 5 if SCr improves with poor wound healing and borderline weight.  - CBC today   Severe MR - s/p MVR - Intra-op TEE with severe MR 02/08/24 - Trivial MR on echo 02/14/24 - Echo 5/21 with trivial MR   4. Hypervolemic hyponatremia - Felt to be multifactorial, volume overload, component of SIADH - Diuretics as above   5. Left pleural effusion - S/p left thoracentesis on 02/27/24 - Continue diuretics as above   6. Debility - discuss CR at follow up. Back home, no  longer at SNF.   7. Long QT - QTc 434 today. Resolved.    Follow up in 2 weeks with APP (assess volume and add beta blocker, ReDS) and 3 months with Dr. Julane [name] + echo    Sheryl Donna AGACNP-BC  04/05/24

## 2024-04-04 NOTE — Telephone Encounter (Signed)
 Called to confirm/remind patient of their appointment at the [facility] on 04/05/24.   Appointment:   [x] Confirmed  [] Left mess   [] No answer/No voice mail  [] VM Full/unable to leave message  [] Phone not in service  Patient reminded to bring all medications and/or complete list.  Confirmed patient has transportation. Gave directions, instructed to utilize valet parking.

## 2024-04-05 ENCOUNTER — Ambulatory Visit (HOSPITAL_COMMUNITY): Payer: Self-pay | Admitting: Internal Medicine

## 2024-04-05 ENCOUNTER — Encounter (HOSPITAL_COMMUNITY): Payer: Self-pay

## 2024-04-05 ENCOUNTER — Ambulatory Visit (HOSPITAL_COMMUNITY)
Admission: RE | Admit: 2024-04-05 | Discharge: 2024-04-05 | Disposition: A | Discharge status: Home / Self Care | Source: Ambulatory Visit | Attending: Internal Medicine | Admitting: Internal Medicine

## 2024-04-05 VITALS — BP 152/84 | HR 71 | Wt 135.2 lb

## 2024-04-05 DIAGNOSIS — Z79899 Other long term (current) drug therapy: Secondary | ICD-10-CM | POA: Insufficient documentation

## 2024-04-05 DIAGNOSIS — I340 Nonrheumatic mitral (valve) insufficiency: Secondary | ICD-10-CM | POA: Diagnosis not present

## 2024-04-05 DIAGNOSIS — I5022 Chronic systolic (congestive) heart failure: Secondary | ICD-10-CM | POA: Diagnosis not present

## 2024-04-05 DIAGNOSIS — I4819 Other persistent atrial fibrillation: Secondary | ICD-10-CM | POA: Diagnosis not present

## 2024-04-05 DIAGNOSIS — J90 Pleural effusion, not elsewhere classified: Secondary | ICD-10-CM | POA: Diagnosis not present

## 2024-04-05 DIAGNOSIS — Z9889 Other specified postprocedural states: Secondary | ICD-10-CM

## 2024-04-05 DIAGNOSIS — I480 Paroxysmal atrial fibrillation: Secondary | ICD-10-CM | POA: Diagnosis not present

## 2024-04-05 DIAGNOSIS — R9431 Abnormal electrocardiogram [ECG] [EKG]: Secondary | ICD-10-CM | POA: Insufficient documentation

## 2024-04-05 DIAGNOSIS — I110 Hypertensive heart disease with heart failure: Secondary | ICD-10-CM | POA: Diagnosis not present

## 2024-04-05 DIAGNOSIS — I428 Other cardiomyopathies: Secondary | ICD-10-CM | POA: Diagnosis not present

## 2024-04-05 DIAGNOSIS — E871 Hypo-osmolality and hyponatremia: Secondary | ICD-10-CM

## 2024-04-05 DIAGNOSIS — Z87440 Personal history of urinary (tract) infections: Secondary | ICD-10-CM | POA: Insufficient documentation

## 2024-04-05 DIAGNOSIS — Z7901 Long term (current) use of anticoagulants: Secondary | ICD-10-CM | POA: Insufficient documentation

## 2024-04-05 DIAGNOSIS — R5381 Other malaise: Secondary | ICD-10-CM | POA: Diagnosis not present

## 2024-04-05 LAB — CBC
HCT: 33 % — ABNORMAL LOW (ref 36.0–46.0)
Hemoglobin: 10.3 g/dL — ABNORMAL LOW (ref 12.0–15.0)
MCH: 28.4 pg (ref 26.0–34.0)
MCHC: 31.2 g/dL (ref 30.0–36.0)
MCV: 90.9 fL (ref 80.0–100.0)
Platelets: 229 10*3/uL (ref 150–400)
RBC: 3.63 MIL/uL — ABNORMAL LOW (ref 3.87–5.11)
RDW: 16.8 % — ABNORMAL HIGH (ref 11.5–15.5)
WBC: 8.8 10*3/uL (ref 4.0–10.5)
nRBC: 0 % (ref 0.0–0.2)

## 2024-04-05 LAB — BASIC METABOLIC PANEL WITH GFR
Anion gap: 10 (ref 5–15)
BUN: 27 mg/dL — ABNORMAL HIGH (ref 8–23)
CO2: 18 mmol/L — ABNORMAL LOW (ref 22–32)
Calcium: 8.6 mg/dL — ABNORMAL LOW (ref 8.9–10.3)
Chloride: 105 mmol/L (ref 98–111)
Creatinine, Ser: 1.64 mg/dL — ABNORMAL HIGH (ref 0.44–1.00)
GFR, Estimated: 31 mL/min — ABNORMAL LOW (ref >60)
Glucose, Bld: 88 mg/dL (ref 70–99)
Potassium: 4.9 mmol/L (ref 3.5–5.1)
Sodium: 133 mmol/L — ABNORMAL LOW (ref 135–145)

## 2024-04-05 LAB — BRAIN NATRIURETIC PEPTIDE: B Natriuretic Peptide: 605.3 pg/mL — ABNORMAL HIGH (ref 0.0–100.0)

## 2024-04-05 MED ORDER — METOLAZONE 2.5 MG PO TABS: 2.5000 mg | ORAL_TABLET | ORAL | 0 refills | Status: DC

## 2024-04-05 MED ORDER — TORSEMIDE 40 MG PO TABS
ORAL_TABLET | ORAL | 3 refills | Status: DC
Start: 2024-04-05 — End: 2024-04-20

## 2024-04-05 MED ORDER — POTASSIUM CHLORIDE CRYS ER 20 MEQ PO TBCR: 40.0000 meq | EXTENDED_RELEASE_TABLET | Freq: Every day | ORAL | Status: DC

## 2024-04-05 NOTE — Progress Notes (Signed)
 ReDS Vest / Clip - 04/05/24 1000       ReDS Vest / Clip   Station Marker C    Ruler Value 24.5    ReDS Value Range Low volume    ReDS Actual Value 33

## 2024-04-05 NOTE — Patient Instructions (Signed)
 Medication Changes:  INCREASE Torsemide  to 60 mg (3 tabs)  in AM and 40 mg (2 tabs) in PM  Take Metolazone  2.5 mg TODAY along with an extra 40 meq (2 tabs) of Potassium  Lab Work:  Labs done today, your results will be available in MyChart, we will contact you for abnormal readings.   Testing/Procedures:  Your physician has requested that you have an echocardiogram. Echocardiography is a painless test that uses sound waves to create images of your heart. It provides your doctor with information about the size and shape of your heart and how well your heart's chambers and valves are working. This procedure takes approximately one hour. There are no restrictions for this procedure. Please do NOT wear cologne, perfume, aftershave, or lotions (deodorant is allowed). Please arrive 15 minutes prior to your appointment time.  Please note: We ask at that you not bring children with you during ultrasound (echo/ vascular) testing. Due to room size and safety concerns, children are not allowed in the ultrasound rooms during exams. Our front office staff cannot provide observation of children in our lobby area while testing is being conducted. An adult accompanying a patient to their appointment will only be allowed in the ultrasound room at the discretion of the ultrasound technician under special circumstances. We apologize for any inconvenience.  Special Instructions // Education:  Do the following things EVERYDAY: Weigh yourself in the morning before breakfast. Write it down and keep it in a log. Take your medicines as prescribed Eat low salt foods--Limit salt (sodium) to 2000 mg per day.  Stay as active as you can everyday Limit all fluids for the day to less than 2 liters   Follow-Up in: 2 weeks and again in 2 months   At the [facility], you and your health needs are our priority. We have a designated team specialized in the treatment of Heart Failure. This Care Team  includes your primary Heart Failure Specialized Cardiologist (physician), Advanced Practice Providers (APPs- Physician Assistants and Nurse Practitioners), and Pharmacist who all work together to provide you with the care you need, when you need it.   You may see any of the following providers on your designated Care Team at your next follow up:  Dr. Jules Oar Dr. Peder Bourdon Dr. Alwin Baars Dr. Judyth Nunnery Nieves Bars, NP Ruddy Corral, [place] [facility] [place], [place] Dennise Fitz, NP [place] Lee, NP Luster Salters, PharmD   Please be sure to bring in all your medications bottles to every appointment.   Need to Contact Us :  If you have any questions or concerns before your next appointment please send us  a message through [place] or call our office at [phone].    TO LEAVE A MESSAGE FOR THE NURSE SELECT OPTION 2, PLEASE LEAVE A MESSAGE INCLUDING: YOUR NAME DATE OF BIRTH CALL BACK NUMBER REASON FOR CALL**this is important as we prioritize the call backs  YOU WILL RECEIVE A CALL BACK THE SAME DAY AS LONG AS YOU CALL BEFORE 4:00 PM

## 2024-04-05 NOTE — Addendum Note (Signed)
 Encounter addended by: Sheryl Donna, NP on: 04/05/2024 1:27 PM  Actions taken: Clinical Note Signed

## 2024-04-06 ENCOUNTER — Telehealth: Payer: Self-pay | Admitting: Home Health

## 2024-04-06 DIAGNOSIS — I10 Essential (primary) hypertension: Secondary | ICD-10-CM | POA: Diagnosis not present

## 2024-04-06 DIAGNOSIS — I4891 Unspecified atrial fibrillation: Secondary | ICD-10-CM | POA: Diagnosis not present

## 2024-04-06 DIAGNOSIS — F419 Anxiety disorder, unspecified: Secondary | ICD-10-CM | POA: Diagnosis not present

## 2024-04-06 DIAGNOSIS — Z48812 Encounter for surgical aftercare following surgery on the circulatory system: Secondary | ICD-10-CM | POA: Diagnosis not present

## 2024-04-06 DIAGNOSIS — G4700 Insomnia, unspecified: Secondary | ICD-10-CM | POA: Diagnosis not present

## 2024-04-06 DIAGNOSIS — L89152 Pressure ulcer of sacral region, stage 2: Secondary | ICD-10-CM | POA: Diagnosis not present

## 2024-04-06 DIAGNOSIS — I272 Pulmonary hypertension, unspecified: Secondary | ICD-10-CM | POA: Diagnosis not present

## 2024-04-06 DIAGNOSIS — F331 Major depressive disorder, recurrent, moderate: Secondary | ICD-10-CM | POA: Diagnosis not present

## 2024-04-06 DIAGNOSIS — I2510 Atherosclerotic heart disease of native coronary artery without angina pectoris: Secondary | ICD-10-CM | POA: Diagnosis not present

## 2024-04-06 MED ORDER — AMIODARONE HCL 200 MG PO TABS: 200.0000 mg | ORAL_TABLET | Freq: Every day | ORAL | 0 refills | Status: DC

## 2024-04-06 NOTE — Telephone Encounter (Signed)
 Patient called after hour line, states she does not have torsemide , metolazone , and amiodarone . She asked about if losing 3 IBS in 24 hours is normal. She feels fine otherwise. [facility] states [facility] [place] had no supply of torsemide , metolazone , which were sent yesterday. Asked the patient to [facility] different [facility] to [facility] for both medication. [facility]iodarone  200mg  [facility] [address] per patient request. Reassured that it's Ok to lose 3 ib especially her weight was up 5 IBS during yesterday visit and she suppose to take additional diuretic. Advised her to call back if she is hypotensive, dizzy, etc. She understood.

## 2024-04-09 DIAGNOSIS — I5022 Chronic systolic (congestive) heart failure: Secondary | ICD-10-CM | POA: Diagnosis not present

## 2024-04-09 DIAGNOSIS — I509 Heart failure, unspecified: Secondary | ICD-10-CM | POA: Diagnosis not present

## 2024-04-09 DIAGNOSIS — L03019 Cellulitis of unspecified finger: Secondary | ICD-10-CM | POA: Diagnosis not present

## 2024-04-09 DIAGNOSIS — R42 Dizziness and giddiness: Secondary | ICD-10-CM | POA: Diagnosis not present

## 2024-04-10 ENCOUNTER — Telehealth (HOSPITAL_COMMUNITY): Payer: Self-pay | Admitting: Cardiology

## 2024-04-10 DIAGNOSIS — G4700 Insomnia, unspecified: Secondary | ICD-10-CM | POA: Diagnosis not present

## 2024-04-10 DIAGNOSIS — I272 Pulmonary hypertension, unspecified: Secondary | ICD-10-CM | POA: Diagnosis not present

## 2024-04-10 DIAGNOSIS — L89152 Pressure ulcer of sacral region, stage 2: Secondary | ICD-10-CM | POA: Diagnosis not present

## 2024-04-10 DIAGNOSIS — I2510 Atherosclerotic heart disease of native coronary artery without angina pectoris: Secondary | ICD-10-CM | POA: Diagnosis not present

## 2024-04-10 DIAGNOSIS — Z48812 Encounter for surgical aftercare following surgery on the circulatory system: Secondary | ICD-10-CM | POA: Diagnosis not present

## 2024-04-10 DIAGNOSIS — F419 Anxiety disorder, unspecified: Secondary | ICD-10-CM | POA: Diagnosis not present

## 2024-04-10 DIAGNOSIS — F331 Major depressive disorder, recurrent, moderate: Secondary | ICD-10-CM | POA: Diagnosis not present

## 2024-04-10 DIAGNOSIS — I10 Essential (primary) hypertension: Secondary | ICD-10-CM | POA: Diagnosis not present

## 2024-04-10 DIAGNOSIS — I4891 Unspecified atrial fibrillation: Secondary | ICD-10-CM | POA: Diagnosis not present

## 2024-04-10 NOTE — Telephone Encounter (Signed)
 Patient called to request letter for long term disability Reports her employer is needed a letter explaining cardiac history and reasons why she should qualify for LTD

## 2024-04-11 DIAGNOSIS — I272 Pulmonary hypertension, unspecified: Secondary | ICD-10-CM | POA: Diagnosis not present

## 2024-04-11 DIAGNOSIS — Z48812 Encounter for surgical aftercare following surgery on the circulatory system: Secondary | ICD-10-CM | POA: Diagnosis not present

## 2024-04-11 DIAGNOSIS — G4700 Insomnia, unspecified: Secondary | ICD-10-CM | POA: Diagnosis not present

## 2024-04-11 DIAGNOSIS — F331 Major depressive disorder, recurrent, moderate: Secondary | ICD-10-CM | POA: Diagnosis not present

## 2024-04-11 DIAGNOSIS — I2510 Atherosclerotic heart disease of native coronary artery without angina pectoris: Secondary | ICD-10-CM | POA: Diagnosis not present

## 2024-04-11 DIAGNOSIS — I10 Essential (primary) hypertension: Secondary | ICD-10-CM | POA: Diagnosis not present

## 2024-04-11 DIAGNOSIS — L89152 Pressure ulcer of sacral region, stage 2: Secondary | ICD-10-CM | POA: Diagnosis not present

## 2024-04-11 DIAGNOSIS — F419 Anxiety disorder, unspecified: Secondary | ICD-10-CM | POA: Diagnosis not present

## 2024-04-11 DIAGNOSIS — I4891 Unspecified atrial fibrillation: Secondary | ICD-10-CM | POA: Diagnosis not present

## 2024-04-12 DIAGNOSIS — G4700 Insomnia, unspecified: Secondary | ICD-10-CM | POA: Diagnosis not present

## 2024-04-12 DIAGNOSIS — I272 Pulmonary hypertension, unspecified: Secondary | ICD-10-CM | POA: Diagnosis not present

## 2024-04-12 DIAGNOSIS — F331 Major depressive disorder, recurrent, moderate: Secondary | ICD-10-CM | POA: Diagnosis not present

## 2024-04-12 DIAGNOSIS — I2510 Atherosclerotic heart disease of native coronary artery without angina pectoris: Secondary | ICD-10-CM | POA: Diagnosis not present

## 2024-04-12 DIAGNOSIS — L89152 Pressure ulcer of sacral region, stage 2: Secondary | ICD-10-CM | POA: Diagnosis not present

## 2024-04-12 DIAGNOSIS — N184 Chronic kidney disease, stage 4 (severe): Secondary | ICD-10-CM | POA: Diagnosis not present

## 2024-04-12 DIAGNOSIS — I10 Essential (primary) hypertension: Secondary | ICD-10-CM | POA: Diagnosis not present

## 2024-04-12 DIAGNOSIS — Z48812 Encounter for surgical aftercare following surgery on the circulatory system: Secondary | ICD-10-CM | POA: Diagnosis not present

## 2024-04-12 DIAGNOSIS — F419 Anxiety disorder, unspecified: Secondary | ICD-10-CM | POA: Diagnosis not present

## 2024-04-12 DIAGNOSIS — I4891 Unspecified atrial fibrillation: Secondary | ICD-10-CM | POA: Diagnosis not present

## 2024-04-12 NOTE — Telephone Encounter (Signed)
 Pt reports she does not have STD and LTD will kick in on July 9th, was only reaching out as advised by employer  Aware LTD is not recommended by provider at this time

## 2024-04-13 ENCOUNTER — Encounter: Payer: Self-pay | Admitting: Cardiology

## 2024-04-13 ENCOUNTER — Ambulatory Visit: Attending: Cardiology | Admitting: Cardiology

## 2024-04-13 ENCOUNTER — Other Ambulatory Visit: Payer: Self-pay | Admitting: Cardiology

## 2024-04-13 ENCOUNTER — Telehealth (HOSPITAL_COMMUNITY): Payer: Self-pay | Admitting: Cardiology

## 2024-04-13 VITALS — BP 114/68 | HR 86 | Ht 64.0 in | Wt 123.8 lb

## 2024-04-13 DIAGNOSIS — I340 Nonrheumatic mitral (valve) insufficiency: Secondary | ICD-10-CM

## 2024-04-13 DIAGNOSIS — I951 Orthostatic hypotension: Secondary | ICD-10-CM

## 2024-04-13 DIAGNOSIS — Z9889 Other specified postprocedural states: Secondary | ICD-10-CM

## 2024-04-13 DIAGNOSIS — R5381 Other malaise: Secondary | ICD-10-CM | POA: Diagnosis not present

## 2024-04-13 DIAGNOSIS — Z09 Encounter for follow-up examination after completed treatment for conditions other than malignant neoplasm: Secondary | ICD-10-CM

## 2024-04-13 DIAGNOSIS — Z79899 Other long term (current) drug therapy: Secondary | ICD-10-CM

## 2024-04-13 DIAGNOSIS — I5022 Chronic systolic (congestive) heart failure: Secondary | ICD-10-CM | POA: Diagnosis not present

## 2024-04-13 DIAGNOSIS — I4891 Unspecified atrial fibrillation: Secondary | ICD-10-CM | POA: Diagnosis not present

## 2024-04-13 DIAGNOSIS — Z5181 Encounter for therapeutic drug level monitoring: Secondary | ICD-10-CM | POA: Diagnosis not present

## 2024-04-13 DIAGNOSIS — I4819 Other persistent atrial fibrillation: Secondary | ICD-10-CM | POA: Diagnosis not present

## 2024-04-13 LAB — LAB REPORT - SCANNED: EGFR: 26

## 2024-04-13 MED ORDER — APIXABAN 2.5 MG PO TABS: 2.5000 mg | ORAL_TABLET | Freq: Two times a day (BID) | ORAL | 11 refills | Status: AC

## 2024-04-13 MED ORDER — APIXABAN 2.5 MG PO TABS: 2.5000 mg | ORAL_TABLET | Freq: Two times a day (BID) | ORAL | 11 refills | Status: DC

## 2024-04-13 MED ORDER — POTASSIUM CHLORIDE CRYS ER 20 MEQ PO TBCR: 40.0000 meq | EXTENDED_RELEASE_TABLET | Freq: Every day | ORAL | Status: DC

## 2024-04-13 NOTE — Progress Notes (Unsigned)
 Cardiology Office Note    Date:  04/14/2024  ID:  Micah, Galeno 1941-02-12, MRN [id number] PCP:  [name], MD  Cardiologist:  [name], MD  Electrophysiologist:  None   Chief Complaint: Dizziness/lightheadedness  History of Present Illness: .    GIANAH BATT is a 83 y.o. female with visit-pertinent history of hypertension, hyperlipidemia, CKD stage III, diverticulosis, endometrial cancer in 2009 treated with surgery and radiation therapy, mitral valve prolapse and MR s/p , Chronic dyspnea on exertion, iron  deficiency anemia with prior GI evaluation revealing gastritis in 2019 on iron  therapy.  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  Echocardiogram on 11/14/2023 showed EF 60 to 65%, RVSP 48.2 mmHg, severe LAE, mild to moderate RAE, severe MR with prolapse of both leaflets of mitral valve, moderate TR, mild AI.  Patient was seen by Dr. Berry Bristol in 12/2023 with recommendation for cardiac catheterization and TEE prior to consideration of valve surgery by CT surgery.  Card catheterization on 12/22/2023 showed essentially normal coronary arteries, cardiac index 4.41, elevated likely due to significant MR, severe mitral digitation.  TEE performed on the same day showed EF 60 to 65%, normal RV, severe LAE, severe moderate TR, trivial AI, possible tiny PFO with left-to-right shunt, moderate grade 3 plaque involving the aortic arch and descending aorta.  Postop she went into atrial fibrillation with RVR with heart rates up to 150s.  She was asymptomatic other than mild complaint of palpitations.  She was seen by Dr. Berry Bristol who thought atrial fibrillation onset was related to mitral regurgitation and started on IV amiodarone  and Eliquis .  She converted back to sinus rhythm while on IV amiodarone .  She was transition to oral amiodarone .   Patient was admitted on 02/08/24 for MVR, maze and left atrial appendage clipping.  IntraOp TEE with EF 55 to 60%  and severe MR.  Postop course complicated by persistent A-fib with RVR and vasoplegic shock requiring vasopressors.  She was able to extubate the following day and pressors were weaned off.  Advanced heart failure team was consulted after echocardiogram showed new drop in EF to 35 to 40% with persistent A-fib and volume overload.  Patient was diuresed and was able to wean drips and GDMT titrated that was limited by soft blood pressures.  On/28/25 she underwent left thoracentesis and was discharged to SNF.  Patient was seen by heart failure team for follow-up on 03/13/2024.  Reported overall feeling fine, she had remained at SNF and was working with PT.  She reported rare dizziness with no falls.  Her metolazone  was stopped.  Echocardiogram on 03/21/2024 indicated LVEF of 55 to 60%, no RWMA, diastolic function could not be evaluated, RV systolic function and size was normal, there was trivial valve regurgitation, mean mitral valve gradient 3.0 mmHg, a 30 mm prosthetic annuloplasty ring present in the mitral position.  Tricuspid valve regurgitation was moderate.  Patient was seen in clinic on 04/05/2024, Irving Mantle, NP with heart failure clinic for follow-up, reported feeling okay overall was limited with intermittent shortness of breath.  Denied palpitations, chest pain, dizziness, edema.  Patient reported same shortness of breath from discharge but she is able to do ADLs as long as she took breaks.  Patient's BNP was elevated to 605.   Today patient presents reporting increased dizziness and lightheadedness.  Patient notes that she saw her PCP 2 days prior who decreased her torsemide  and was concerned regarding the medications that she  was taking.  On review of a modified med list from her PCP it is listed that she is taking metolazone , torsemide , triamterene -hydrochlorothiazide  and spironolactone .  On review with patient she reports that she has not been taking triamterene  hydrochlorothiazide  or metolazone  however she  did take increased doses of torsemide  last week per heart failure clinic.  Reviewed heart failure clinic note from last week with patient which reported that she was told to take a dose of metolazone , patient does not believe she ever took this medication.  She does report that she was on increased doses of torsemide  last week with significant urine output and ever since has had worsening dizziness.  She also reports that her legs feel heavy, however reports that her lower extremity edema has improved, she questions if this is related to muscle aching.  Her husband reports that she has walked more in the last 2 days then she has prior to her procedure.  Patient reports that her breathing has improved however she is now limited by worsening dizziness specifically with position changes.  On review of patient's weight she has lost 12 pounds since being seen by heart failure clinic.  Labwork independently reviewed: 04/05/2024: Sodium 133, potassium 4.9, creatinine 1.64, BNP 605.3, hemoglobin 10.3, hematocrit 33 ROS: .   Today she denies chest pain, shortness of breath, palpitations, melena, hematuria, hemoptysis, diaphoresis, weakness, presyncope, syncope, orthopnea, and PND.  All other systems are reviewed and otherwise negative. Studies Reviewed: Aaron Aas   EKG:  EKG is ordered today, personally reviewed, demonstrating  EKG Interpretation Date/Time:  Friday April 13 2024 11:44:07 EDT Ventricular Rate:  80 PR Interval:  178 QRS Duration:  110 QT Interval:  426 QTC Calculation: 491 R Axis:   58  Text Interpretation: Normal sinus rhythm with sinus arrhythmia Nonspecific T wave abnormality Prolonged QT When compared with ECG of 05-Apr-2024 10:00, QT has lengthened Confirmed by Kingston Guiles [phone]) on 04/14/2024 12:36:46 PM   CV Studies: Cardiac studies reviewed are outlined and summarized above. Otherwise please see EMR for full report. Cardiac Studies & Procedures    ______________________________________________________________________________________________ CARDIAC CATHETERIZATION  CARDIAC CATHETERIZATION 12/30/2023  Conclusion Images from the original result were not included. Right & Left Heart Catheterization 12/30/23: Hemodynamic data: Right heart catheterization consistent with mild pulmonary hypertension with normal LVEDP, prominent V waves on wedge pressure tracing suggests significant MR, both right upper pulmonary and left upper pulmonary veins were interrogated, approximate A wave 12 mmHg and V waves 22 mmHg. QP/QS 1.00, PAPi 3.0 suggest preserved RV function. CO 7.76, CI 4.41, elevated due to significant MR probably. LVEDP 11 mmHg, no pressure differential between aorta and LV, no suggestion of aortic stenosis.  Angiographic data: RCA: Superdominant vessel.  It is tortuous in the midsegment.  Otherwise smooth and normal. LM: Large-caliber vessel smooth and normal. LAD: Ends before reaching the LV apex, gives origin to large D1 with secondary branches, has minimal disease in the midsegment. LCx: Small vessel, small OM1.    Impression and recommendations: Findings suggestive of severe MR.  Will await on formal reports on TEE and make final recommendations.  Findings Coronary Findings Diagnostic  Dominance: Right  Left Anterior Descending Mid LAD lesion is 20% stenosed.  Intervention  No interventions have been documented.   STRESS TESTS  PCV MYOCARDIAL PERFUSION WITH LEXISCAN  11/12/2019  Narrative Lexiscan  Tetrofosmin  Stress Test  11/12/2019: Nondiagnostic ECG stress. Mild soft tissue attenuation noted in the inferior wall. No ischemia or scar. Stress LV EF: 70%. No previous exam available  for comparison. Low risk study.   ECHOCARDIOGRAM  ECHOCARDIOGRAM COMPLETE 03/21/2024  Narrative ECHOCARDIOGRAM REPORT    Patient Name:   CIERAH CRADER  Date of Exam: 03/21/2024 Medical Rec #:  [id number]     Height:       64.0  in Accession #:    [id number]    Weight:       131.4 lb Date of Birth:  January 30, 1941     BSA:          1.637 m Patient Age:    82 years      BP:           118/62 mmHg Patient Gender: F             HR:           76 bpm. Exam Location:  [address]  Procedure: 2D Echo, Cardiac Doppler and Color Doppler (Both Spectral and Color Flow Doppler were utilized during procedure).  Indications:    I05.9 Mitral Valve Disorder  History:        Patient has prior history of Echocardiogram examinations, most recent 02/14/2024. Mitral Valve Repair-30 mm Simulus Semi-Rigid Annuloplasty Band. Maze procedure with LAA Clipping-40 mm Medtronic Clip., Mitral Valve Prolapse, Arrythmias:Atrial Fibrillation; Risk Factors:Hypertension.  Mitral Valve: 30 mm prosthetic annuloplasty ring valve is present in the mitral position.  Sonographer:    Cheri Coria Rodgers-Mcglothen RDCS Referring Phys: [id number] [name]  IMPRESSIONS   1. Left ventricular ejection fraction, by estimation, is 55 to 60%. The left ventricle has normal function. The left ventricle has no regional wall motion abnormalities. Left ventricular diastolic function could not be evaluated. The global longitudinal strain is normal. 2. Right ventricular systolic function is normal. The right ventricular size is normal. 3. The mitral valve has been repaired/replaced. Trivial mitral valve regurgitation. The mean mitral valve gradient is 3.0 mmHg. There is a 30 mm prosthetic annuloplasty ring present in the mitral position. 4. Tricuspid valve regurgitation is moderate. 5. The aortic valve is normal in structure. Aortic valve regurgitation is not visualized.  FINDINGS Left Ventricle: Left ventricular ejection fraction, by estimation, is 55 to 60%. The left ventricle has normal function. The left ventricle has no regional wall motion abnormalities. Strain was performed and the global longitudinal strain is normal. The left ventricular internal cavity size was  normal in size. There is no left ventricular hypertrophy. Abnormal (paradoxical) septal motion consistent with post-operative status. Left ventricular diastolic function could not be evaluated due to mitral valve repair. Left ventricular diastolic function could not be evaluated.  Right Ventricle: The right ventricular size is normal. No increase in right ventricular wall thickness. Right ventricular systolic function is normal.  Left Atrium: Left atrial size was normal in size.  Right Atrium: Right atrial size was normal in size.  Pericardium: There is no evidence of pericardial effusion.  The mitral valve has been repaired/replaced. Trivial mitral valve regurgitation. There is a 30 mm prosthetic annuloplasty ring present in the mitral position. Tricuspid Valve: The tricuspid valve is normal in structure. Tricuspid valve regurgitation is moderate.  Aortic Valve: The aortic valve is normal in structure. Aortic valve regurgitation is not visualized.  Pulmonic Valve: The pulmonic valve was normal in structure. Pulmonic valve regurgitation is not visualized.  Aorta: The aortic root and ascending aorta are structurally normal, with no evidence of dilitation.  IAS/Shunts: The interatrial septum was not well visualized.  Additional Comments: 3D was performed not requiring image post processing on an independent workstation  and was normal.   LEFT VENTRICLE PLAX 2D LVIDd:         4.80 cm   Diastology LVIDs:         3.40 cm   LV e' medial:    10.90 cm/s LV PW:         0.80 cm   LV E/e' medial:  12.5 LV IVS:        0.80 cm   LV e' lateral:   9.68 cm/s LVOT diam:     2.00 cm   LV E/e' lateral: 14.1 LV SV:         52 LV SV Index:   32 LVOT Area:     3.14 cm  3D Volume EF: 3D EF:        55 % LV EDV:       84 ml LV ESV:       38 ml LV SV:        46 ml  RIGHT VENTRICLE             IVC RV Basal diam:  3.60 cm     IVC diam: 1.40 cm RV S prime:     15.63 cm/s TAPSE (M-mode): 1.2  cm  LEFT ATRIUM             Index        RIGHT ATRIUM           Index LA diam:        4.20 cm 2.57 cm/m   RA Area:     12.30 cm LA Vol (A2C):   44.2 ml 27.01 ml/m  RA Volume:   27.80 ml  16.99 ml/m LA Vol (A4C):   48.0 ml 29.33 ml/m LA Biplane Vol: 48.7 ml 29.76 ml/m AORTIC VALVE LVOT Vmax:   95.50 cm/s LVOT Vmean:  61.500 cm/s LVOT VTI:    0.165 m  AORTA Ao Root diam: 2.90 cm Ao Asc diam:  3.50 cm  MITRAL VALVE                TRICUSPID VALVE MV Area (PHT): 4.43 cm     TR Peak grad:   30.0 mmHg MV Area VTI:   1.27 cm     TR Vmax:        274.00 cm/s MV Peak grad:  8.3 mmHg MV Mean grad:  3.0 mmHg     SHUNTS MV Vmax:       1.44 m/s     Systemic VTI:  0.16 m MV Vmean:      79.3 cm/s    Systemic Diam: 2.00 cm MV Decel Time: 171 msec MV E velocity: 136.67 cm/s MV A velocity: 72.73 cm/s MV E/A ratio:  1.88  Aditya Sabharwal Electronically signed by Alwin Baars Signature Date/Time: 03/21/2024/5:48:27 PM    Final   TEE  ECHO INTRAOPERATIVE TEE 02/08/2024  Narrative *INTRAOPERATIVE TRANSESOPHAGEAL REPORT *    Patient Name:   Aram Beagle Date of Exam: 02/08/2024 Medical Rec #:  [id number]    Height:       64.0 in Accession #:    [id number]   Weight:       151.0 lb Date of Birth:  1941/03/12    BSA:          1.74 m Patient Age:    82 years     BP:           100/61 mmHg Patient Gender: F  HR:           70 bpm. Exam Location:  Inpatient  Transesophogeal exam was perform intraoperatively during surgical procedure. Patient was closely monitored under general anesthesia during the entirety of examination.  Indications:     Mitral and Tricuspid Abnormalities Performing Phys: [id number] Melene Sportsman Diagnosing Phys: Ellena Gurney MD  PROCEDURE: Intraoperative Transesophogeal 3D imaging performed to better assess valves. Complications: No known complications during this procedure. POST-OP IMPRESSIONS _ Left Atrial Appendage: Has been surgically closed and  the repair appears to be oversewn. _ Mitral Valve: No stenosis present. There is no regurgitation.Mean PG 2 mmHg across the repaired MV.. The mitral valve is status post repair with an annuloplasty ring.  PRE-OP FINDINGS Left Ventricle: The left ventricle has normal systolic function, with an ejection fraction of 55-60%. The cavity size was normal. There is no increase in left ventricular wall thickness. There is no left ventricular hypertrophy.   Right Ventricle: The right ventricle has normal systolic function. The cavity was normal. There is no increase in right ventricular wall thickness.  Left Atrium: Left atrial size was dilated. No left atrial/left atrial appendage thrombus was detected. Left atrial appendage velocity is normal at greater than 40 cm/s.  Right Atrium: Right atrial size was normal in size.  Interatrial Septum: No atrial level shunt detected by color flow Doppler.  Pericardium: There is no evidence of pericardial effusion.  Mitral Valve: The mitral valve is myxomatous. Mitral valve regurgitation is severe by color flow Doppler. The MR jet is anteriorly-directed. There is No evidence of mitral stenosis. Posterior leaflet with more severe prolapse causing it to override the anterior.  Tricuspid Valve: The tricuspid valve was normal in structure. Tricuspid valve regurgitation is moderate by color flow Doppler. No evidence of tricuspid stenosis is present.  Aortic Valve: The aortic valve is tricuspid Aortic valve regurgitation was not visualized by color flow Doppler. There is no stenosis of the aortic valve.   Pulmonic Valve: The pulmonic valve was normal in structure, with normal. Pulmonic valve regurgitation is not visualized by color flow Doppler.   Aorta: The aortic root, ascending aorta and aortic arch are normal in size and structure. There is evidence of plaque in the descending aorta; Grade I, measuring 1-48mm in size.   Ellena Gurney MD Electronically  signed by Ellena Gurney MD Signature Date/Time: 02/10/2024/2:07:50 PM    Final  MONITORS  LONG TERM MONITOR (3-14 DAYS) 11/26/2021       ______________________________________________________________________________________________       Current Reported Medications:.    Current Meds  Medication Sig   acetaminophen  (TYLENOL ) 500 MG tablet Take 1-2 tablets (500-1,000 mg total) by mouth every 6 (six) hours as needed for mild pain (pain score 1-3) or fever.   amiodarone  (PACERONE ) 200 MG tablet Take 1 tablet (200 mg total) by mouth daily.   atorvastatin  (LIPITOR) 10 MG tablet Take 1 tablet (10 mg total) by mouth daily.   b complex vitamins tablet Take 1 tablet by mouth daily.     CALCIUM  PO Take 650 mg by mouth daily.   cephALEXin (KEFLEX) 500 MG capsule Take 500 mg by mouth 3 (three) times daily. 3 more days ending 06/17   Cholecalciferol (VITAMIN D3) 125 MCG (5000 UT) CAPS Take 5,000 Units by mouth daily.   magnesium  gluconate (MAGONATE) 500 (27 Mg) MG TABS tablet Take 1 tablet (500 mg total) by mouth daily.   melatonin 3 MG TABS tablet Take 1 tablet (3 mg total) by mouth  at bedtime. (Patient taking differently: Take 10 mg by mouth at bedtime.)   metolazone  (ZAROXOLYN ) 2.5 MG tablet Take 1 tablet (2.5 mg total) by mouth as directed.   Multiple Vitamins-Minerals (MULTIVITAMINS THER. W/MINERALS) TABS Take 1 tablet by mouth daily.     ondansetron  (ZOFRAN ) 4 MG tablet Take 4 mg by mouth every 6 (six) hours as needed for nausea or vomiting.   pantoprazole  (PROTONIX ) 40 MG tablet Take 40 mg by mouth daily.   spironolactone  (ALDACTONE ) 25 MG tablet Take 1 tablet (25 mg total) by mouth daily.   Torsemide  40 MG TABS Take 60 mg by mouth in the morning AND 40 mg every evening.   witch hazel-glycerin  (TUCKS) pad Apply topically as needed for itching.   Zinc 50 MG TABS Take 50 mg by mouth daily.   [DISCONTINUED] apixaban  (ELIQUIS ) 2.5 MG TABS tablet Take 1 tablet (2.5 mg total) by mouth 2 (two)  times daily.   [DISCONTINUED] apixaban  (ELIQUIS ) 5 MG TABS tablet Take 1 tablet (5 mg total) by mouth 2 (two) times daily.    Physical Exam:    VS:  BP 114/68   Pulse 86   Ht 5' 4 (1.626 m)   Wt 123 lb 12.8 oz (56.2 kg)   SpO2 99%   BMI 21.25 kg/m    Wt Readings from Last 3 Encounters:  04/13/24 123 lb 12.8 oz (56.2 kg)  04/05/24 135 lb 3.2 oz (61.3 kg)  03/27/24 130 lb (59 kg)    GEN: Well nourished, well developed in no acute distress NECK: No JVD; No carotid bruits CARDIAC: RRR, no murmurs, rubs, gallops RESPIRATORY:  Clear to auscultation without rales, wheezing or rhonchi  ABDOMEN: Soft, non-tender, non-distended EXTREMITIES:  Nonpitting BLE edema with compression hose; No acute deformity     Asessement and Plan:.    Chronic systolic heart failure, NICM/Dizziness/Orthostatic hypotension: Right and left heart catheterization on 2/25 with no CAD.  IntraOp TEE on/9/25 with a EF 55 to 60% and severe MR.  Patient had a drop in EF to 35 to 40% following procedure when in persistent atrial fibrillation with RVR. Echocardiogram on 03/21/2024 indicated LVEF of 55 to 60%, no RWMA, diastolic function could not be evaluated, RV systolic function and size was normal, there was trivial valve regurgitation, mean mitral valve gradient 3.0 mmHg, a 30 mm prosthetic annuloplasty ring present in the mitral position.  Tricuspid valve regurgitation was moderate. Patient with UTI while on Farxiga .  At office visit last week patient was instructed to increase her torsemide  and to take a dose of metolazone  given evidence of fluid overload.  Patient reports increased urine output with increased diuretics, has had a weight loss of 12 pounds.  However she now notes worsening dizziness and lightheadedness with position changes, denies any falls.  She notes that her legs are achy however on further discussion appears to be more muscle aching from increased activity the last 2 days as she notes that her swelling  has also improved.  Patient with positive orthostatic vital signs with a decrease in blood pressure from 129/81 while laying down to 85/50 with initial standing to recovery to 97/64 after 3 minutes.  Instructed patient to hold her spironolactone  until heart failure follow-up next week and to hold her afternoon dose of torsemide  until she is seen by her PCP on Monday, if noted to have improvement in her symptoms can consider restarting dose.   Severe mitral regurgitation: S/p MVR, Maze procedure and left atrial appendage clipping on  02/08/2024. Patient to have repeat echocardiogram in 06/2024.   Persistent atrial fibrillation: S/p maze and left atrial clipping on 4/9 during MVR.  Complicated by postop atrial fibrillation.  Patient had failed DCCV x 2 on 02/17/24.  She is continued on amiodarone  and Eliquis .  Will update her Eliquis  prescription, she is currently listed as taking Eliquis  5 mg twice daily, she should be on a reduced dose of 2.5 mg twice daily given her age, weight and kidney function. Check CBC, CMET, TSH and free T4.     Cardiac Rehabilitation Eligibility Assessment  The patient is NOT ready to start cardiac rehabilitation due to: -- (Patient with symptomatic orthostatic hypotension)    Disposition: F/u with Dr. [name] on Monday 6/16 and HF clinic on 6/20 as scheduled.   Signed, Verdean Murin D Welles Walthall, NP

## 2024-04-13 NOTE — Patient Instructions (Signed)
 Medication Instructions:  Stop Spirolactone Stop Metalozone Hold Afternoon dose of the Torsemide  until you see Dr. [name] on Monday Decrease Eliquis  to 2.5 mg twice a day  *If you need a refill on your cardiac medications before your next appointment, please call your pharmacy*  Lab Work: Today we are going to draw a CBC, Cmet, TSH & Free T4 If you have labs (blood work) drawn today and your tests are completely normal, you will receive your results only by: MyChart Message (if you have MyChart) OR A paper copy in the mail If you have any lab test that is abnormal or we need to change your treatment, we will call you to review the results.  Testing/Procedures: No testing  Follow-Up: At [facility], you and your health needs are our priority.  As part of our continuing mission to provide you with exceptional heart care, our providers are all part of one team.  This team includes your primary Cardiologist (physician) and Advanced Practice Providers or APPs (Physician Assistants and Nurse Practitioners) who all work together to provide you with the care you need, when you need it.  Your next appointment:   See Heart Failure  We recommend signing up for the patient portal called MyChart.  Sign up information is provided on this After Visit Summary.  MyChart is used to connect with patients for Virtual Visits (Telemedicine).  Patients are able to view lab/test results, encounter notes, upcoming appointments, etc.  Non-urgent messages can be sent to your provider as well.   To learn more about what you can do with MyChart, go to [email or web address].

## 2024-04-13 NOTE — Telephone Encounter (Signed)
 Pt left VM on triage line requesting a call Very concern with her legs bothering her  Reports they feel very heavy and she is unable to sleep   Returned called for details   Pt returned called reports she was seen with Dr Manning Seen to keep follow up with PCP and AHF next week

## 2024-04-13 NOTE — Telephone Encounter (Signed)
 Prescription refill request for Eliquis  received. Indication:AFIB Last office visit:6/25 Scr:1.64  6/25 Age: 83 Weight:56.2  kg  Prescription refilled

## 2024-04-14 ENCOUNTER — Encounter: Payer: Self-pay | Admitting: Cardiology

## 2024-04-14 LAB — CBC
Hematocrit: 34.8 % (ref 34.0–46.6)
Hemoglobin: 10.9 g/dL — ABNORMAL LOW (ref 11.1–15.9)
MCH: 27.7 pg (ref 26.6–33.0)
MCHC: 31.3 g/dL — ABNORMAL LOW (ref 31.5–35.7)
MCV: 88 fL (ref 79–97)
Platelets: 291 10*3/uL (ref 150–450)
RBC: 3.94 x10E6/uL (ref 3.77–5.28)
RDW: 14.6 % (ref 11.7–15.4)
WBC: 9.4 10*3/uL (ref 3.4–10.8)

## 2024-04-14 LAB — COMPREHENSIVE METABOLIC PANEL WITH GFR
ALT: 33 IU/L — ABNORMAL HIGH (ref 0–32)
AST: 43 IU/L — ABNORMAL HIGH (ref 0–40)
Albumin: 3.7 g/dL — ABNORMAL LOW (ref 3.8–4.8)
Alkaline Phosphatase: 101 IU/L (ref 44–121)
BUN/Creatinine Ratio: 22 (ref 12–28)
BUN: 38 mg/dL — ABNORMAL HIGH (ref 8–27)
Bilirubin Total: 1.2 mg/dL (ref 0.0–1.2)
CO2: 22 mmol/L (ref 20–29)
Calcium: 9 mg/dL (ref 8.7–10.3)
Chloride: 86 mmol/L — ABNORMAL LOW (ref 96–106)
Creatinine, Ser: 1.71 mg/dL — ABNORMAL HIGH (ref 0.57–1.00)
Globulin, Total: 2.4 g/dL (ref 1.5–4.5)
Glucose: 90 mg/dL (ref 70–99)
Potassium: 3.6 mmol/L (ref 3.5–5.2)
Sodium: 129 mmol/L — ABNORMAL LOW (ref 134–144)
Total Protein: 6.1 g/dL (ref 6.0–8.5)
eGFR: 30 mL/min/{1.73_m2} — ABNORMAL LOW (ref >59)

## 2024-04-14 LAB — T4: T4, Total: 16.3 ug/dL — ABNORMAL HIGH (ref 4.5–12.0)

## 2024-04-14 LAB — TSH: TSH: 6.01 u[IU]/mL — ABNORMAL HIGH (ref 0.450–4.500)

## 2024-04-16 ENCOUNTER — Telehealth: Payer: Self-pay | Admitting: Pharmacy Technician

## 2024-04-16 ENCOUNTER — Other Ambulatory Visit (HOSPITAL_COMMUNITY): Payer: Self-pay

## 2024-04-16 ENCOUNTER — Ambulatory Visit: Payer: Self-pay | Admitting: Cardiology

## 2024-04-16 DIAGNOSIS — G4700 Insomnia, unspecified: Secondary | ICD-10-CM | POA: Diagnosis not present

## 2024-04-16 DIAGNOSIS — I4891 Unspecified atrial fibrillation: Secondary | ICD-10-CM | POA: Diagnosis not present

## 2024-04-16 DIAGNOSIS — F419 Anxiety disorder, unspecified: Secondary | ICD-10-CM | POA: Diagnosis not present

## 2024-04-16 DIAGNOSIS — L89302 Pressure ulcer of unspecified buttock, stage 2: Secondary | ICD-10-CM | POA: Diagnosis not present

## 2024-04-16 DIAGNOSIS — I272 Pulmonary hypertension, unspecified: Secondary | ICD-10-CM | POA: Diagnosis not present

## 2024-04-16 DIAGNOSIS — F331 Major depressive disorder, recurrent, moderate: Secondary | ICD-10-CM | POA: Diagnosis not present

## 2024-04-16 DIAGNOSIS — I5023 Acute on chronic systolic (congestive) heart failure: Secondary | ICD-10-CM | POA: Diagnosis not present

## 2024-04-16 DIAGNOSIS — I10 Essential (primary) hypertension: Secondary | ICD-10-CM | POA: Diagnosis not present

## 2024-04-16 DIAGNOSIS — I2510 Atherosclerotic heart disease of native coronary artery without angina pectoris: Secondary | ICD-10-CM | POA: Diagnosis not present

## 2024-04-16 DIAGNOSIS — L89152 Pressure ulcer of sacral region, stage 2: Secondary | ICD-10-CM | POA: Diagnosis not present

## 2024-04-16 DIAGNOSIS — Z48812 Encounter for surgical aftercare following surgery on the circulatory system: Secondary | ICD-10-CM | POA: Diagnosis not present

## 2024-04-16 NOTE — Telephone Encounter (Signed)
 Pharmacy Patient Advocate Encounter   Received notification from Fax that prior authorization for Eliquis  2.5 MG is required/requested.   Insurance verification completed.   The patient is insured through [employer] .   Per test claim: PA required; PA submitted to above mentioned insurance via Fax Key/confirmation #/EOC faxed Status is pending

## 2024-04-17 ENCOUNTER — Other Ambulatory Visit (HOSPITAL_COMMUNITY): Payer: Self-pay

## 2024-04-17 DIAGNOSIS — I4891 Unspecified atrial fibrillation: Secondary | ICD-10-CM | POA: Diagnosis not present

## 2024-04-17 DIAGNOSIS — Z48812 Encounter for surgical aftercare following surgery on the circulatory system: Secondary | ICD-10-CM | POA: Diagnosis not present

## 2024-04-17 DIAGNOSIS — I2510 Atherosclerotic heart disease of native coronary artery without angina pectoris: Secondary | ICD-10-CM | POA: Diagnosis not present

## 2024-04-17 DIAGNOSIS — F331 Major depressive disorder, recurrent, moderate: Secondary | ICD-10-CM | POA: Diagnosis not present

## 2024-04-17 DIAGNOSIS — I10 Essential (primary) hypertension: Secondary | ICD-10-CM | POA: Diagnosis not present

## 2024-04-17 DIAGNOSIS — G4700 Insomnia, unspecified: Secondary | ICD-10-CM | POA: Diagnosis not present

## 2024-04-17 DIAGNOSIS — F419 Anxiety disorder, unspecified: Secondary | ICD-10-CM | POA: Diagnosis not present

## 2024-04-17 DIAGNOSIS — L89152 Pressure ulcer of sacral region, stage 2: Secondary | ICD-10-CM | POA: Diagnosis not present

## 2024-04-17 DIAGNOSIS — I272 Pulmonary hypertension, unspecified: Secondary | ICD-10-CM | POA: Diagnosis not present

## 2024-04-17 NOTE — Telephone Encounter (Signed)
 Pharmacy Patient Advocate Encounter  Received notification from [employer] that Prior Authorization for Eliquis  has been APPROVED from 04/16/24 to 10/31/24. Ran test claim, Copay is $47.00- one month. This test claim was processed through [facility]- copay amounts may vary at other pharmacies due to pharmacy/plan contracts, or as the patient moves through the different stages of their insurance plan.   PA #/Case ID/Reference #: [id number]

## 2024-04-17 NOTE — Telephone Encounter (Signed)
 Left message to call back

## 2024-04-17 NOTE — Telephone Encounter (Signed)
-----   Message from Katlyn D West sent at 04/16/2024  7:57 AM EDT ----- Please let Ms. Jeffords know that her hemoglobin and hematocrit are stable, there is no evidence of infection.  Her thyroid -stimulating hormone is slightly elevated as well as her free T4, recommend she  follow-up with her PCP regarding this, please ensure that these labs are sent to his office.  Her kidney function appears overall stable although may show she is slightly dry, her sodium level is low  however we are now holding her spironolactone  and decreased her diuretic which should help improve this.  Her potassium level was low normal.  Her liver enzymes were very mildly elevated, we will  continue to monitor on future labs. Follow up with HF clinic on 6/20 as scheduled, please remind her to bring all of her medication bottles to follow up.  ----- Message ----- From: Interface, [facility] Lab Results In Sent: 04/13/2024  11:36 PM EDT To: Katlyn D West, NP

## 2024-04-18 ENCOUNTER — Encounter: Payer: Self-pay | Admitting: Cardiology

## 2024-04-18 DIAGNOSIS — G4700 Insomnia, unspecified: Secondary | ICD-10-CM | POA: Diagnosis not present

## 2024-04-18 DIAGNOSIS — I10 Essential (primary) hypertension: Secondary | ICD-10-CM | POA: Diagnosis not present

## 2024-04-18 DIAGNOSIS — I2510 Atherosclerotic heart disease of native coronary artery without angina pectoris: Secondary | ICD-10-CM | POA: Diagnosis not present

## 2024-04-18 DIAGNOSIS — L89152 Pressure ulcer of sacral region, stage 2: Secondary | ICD-10-CM | POA: Diagnosis not present

## 2024-04-18 DIAGNOSIS — F331 Major depressive disorder, recurrent, moderate: Secondary | ICD-10-CM | POA: Diagnosis not present

## 2024-04-18 DIAGNOSIS — F419 Anxiety disorder, unspecified: Secondary | ICD-10-CM | POA: Diagnosis not present

## 2024-04-18 DIAGNOSIS — Z48812 Encounter for surgical aftercare following surgery on the circulatory system: Secondary | ICD-10-CM | POA: Diagnosis not present

## 2024-04-18 DIAGNOSIS — H10502 Unspecified blepharoconjunctivitis, left eye: Secondary | ICD-10-CM | POA: Diagnosis not present

## 2024-04-18 DIAGNOSIS — I4891 Unspecified atrial fibrillation: Secondary | ICD-10-CM | POA: Diagnosis not present

## 2024-04-18 DIAGNOSIS — I272 Pulmonary hypertension, unspecified: Secondary | ICD-10-CM | POA: Diagnosis not present

## 2024-04-19 ENCOUNTER — Telehealth (HOSPITAL_COMMUNITY): Payer: Self-pay

## 2024-04-19 DIAGNOSIS — E778 Other disorders of glycoprotein metabolism: Secondary | ICD-10-CM | POA: Diagnosis not present

## 2024-04-19 NOTE — Telephone Encounter (Signed)
 Called to confirm/remind patient of their appointment at the [facility] on 04/20/24.   Appointment:   [x] Confirmed  [] Left mess   [] No answer/No voice mail  [] VM Full/unable to leave message  [] Phone not in service  Patient reminded to bring all medications and/or complete list.  Confirmed patient has transportation. Gave directions, instructed to utilize valet parking.

## 2024-04-20 ENCOUNTER — Ambulatory Visit (HOSPITAL_COMMUNITY)
Admission: RE | Admit: 2024-04-20 | Discharge: 2024-04-20 | Disposition: A | Discharge status: Home / Self Care | Source: Ambulatory Visit | Attending: Cardiology | Admitting: Cardiology

## 2024-04-20 ENCOUNTER — Encounter (HOSPITAL_COMMUNITY): Payer: Self-pay

## 2024-04-20 VITALS — BP 110/60 | HR 93 | Ht 64.0 in | Wt 126.4 lb

## 2024-04-20 DIAGNOSIS — I2510 Atherosclerotic heart disease of native coronary artery without angina pectoris: Secondary | ICD-10-CM | POA: Diagnosis not present

## 2024-04-20 DIAGNOSIS — I272 Pulmonary hypertension, unspecified: Secondary | ICD-10-CM | POA: Diagnosis not present

## 2024-04-20 DIAGNOSIS — Z7901 Long term (current) use of anticoagulants: Secondary | ICD-10-CM | POA: Insufficient documentation

## 2024-04-20 DIAGNOSIS — I110 Hypertensive heart disease with heart failure: Secondary | ICD-10-CM | POA: Insufficient documentation

## 2024-04-20 DIAGNOSIS — Z48812 Encounter for surgical aftercare following surgery on the circulatory system: Secondary | ICD-10-CM | POA: Diagnosis not present

## 2024-04-20 DIAGNOSIS — R7989 Other specified abnormal findings of blood chemistry: Secondary | ICD-10-CM | POA: Insufficient documentation

## 2024-04-20 DIAGNOSIS — Z952 Presence of prosthetic heart valve: Secondary | ICD-10-CM | POA: Diagnosis not present

## 2024-04-20 DIAGNOSIS — I480 Paroxysmal atrial fibrillation: Secondary | ICD-10-CM | POA: Diagnosis not present

## 2024-04-20 DIAGNOSIS — I428 Other cardiomyopathies: Secondary | ICD-10-CM | POA: Insufficient documentation

## 2024-04-20 DIAGNOSIS — F331 Major depressive disorder, recurrent, moderate: Secondary | ICD-10-CM | POA: Diagnosis not present

## 2024-04-20 DIAGNOSIS — I872 Venous insufficiency (chronic) (peripheral): Secondary | ICD-10-CM

## 2024-04-20 DIAGNOSIS — I8393 Asymptomatic varicose veins of bilateral lower extremities: Secondary | ICD-10-CM | POA: Diagnosis not present

## 2024-04-20 DIAGNOSIS — Z79899 Other long term (current) drug therapy: Secondary | ICD-10-CM | POA: Diagnosis not present

## 2024-04-20 DIAGNOSIS — I4891 Unspecified atrial fibrillation: Secondary | ICD-10-CM | POA: Diagnosis not present

## 2024-04-20 DIAGNOSIS — I10 Essential (primary) hypertension: Secondary | ICD-10-CM | POA: Diagnosis not present

## 2024-04-20 DIAGNOSIS — I5022 Chronic systolic (congestive) heart failure: Secondary | ICD-10-CM | POA: Diagnosis not present

## 2024-04-20 DIAGNOSIS — G4700 Insomnia, unspecified: Secondary | ICD-10-CM | POA: Diagnosis not present

## 2024-04-20 DIAGNOSIS — F419 Anxiety disorder, unspecified: Secondary | ICD-10-CM | POA: Diagnosis not present

## 2024-04-20 DIAGNOSIS — L89152 Pressure ulcer of sacral region, stage 2: Secondary | ICD-10-CM | POA: Diagnosis not present

## 2024-04-20 NOTE — Patient Instructions (Signed)
 Great to see you today!!!  Medication Changes:  STOP Amiodarone   Referrals:  You have been [facility] to [facility], they will call you to schedule  Special Instructions // Education:  Do the following things EVERYDAY: Weigh yourself in the morning before breakfast. Write it down and keep it in a log. Take your medicines as prescribed Eat low salt foods--Limit salt (sodium) to 2000 mg per day.  Stay as active as you can everyday Limit all fluids for the day to less than 2 liters    Follow-Up in: AS SCHEDULED in August   At the [facility], you and your health needs are our priority. We have a designated team specialized in the treatment of Heart Failure. This Care Team includes your primary Heart Failure Specialized Cardiologist (physician), Advanced Practice Providers (APPs- Physician Assistants and Nurse Practitioners), and Pharmacist who all work together to provide you with the care you need, when you need it.   You may see any of the following providers on your designated Care Team at your next follow up:  Dr. Jules Oar Dr. Peder Bourdon Dr. Alwin Baars Dr. Judyth Nunnery Nieves Bars, NP Ruddy Corral, [place] [facility] Teller, [place] Dennise Fitz, NP [place] Lee, NP Luster Salters, PharmD   Please be sure to bring in all your medications bottles to every appointment.   Need to Contact Us :  If you have any questions or concerns before your next appointment please send us  a message through [place] or call our office at [phone].    TO LEAVE A MESSAGE FOR THE NURSE SELECT OPTION 2, PLEASE LEAVE A MESSAGE INCLUDING: YOUR NAME DATE OF BIRTH CALL BACK NUMBER REASON FOR CALL**this is important as we prioritize the call backs  YOU WILL RECEIVE A CALL BACK THE SAME DAY AS LONG AS YOU CALL BEFORE 4:00 PM

## 2024-04-20 NOTE — Progress Notes (Signed)
 ReDS Vest / Clip - 04/20/24 1400       ReDS Vest / Clip   Station Marker A    Ruler Value 24.5    ReDS Value Range Low volume    ReDS Actual Value 24

## 2024-04-20 NOTE — Progress Notes (Addendum)
 ADVANCED HF CLINIC NOTE  Primary Care: Dawn Man, MD Primary Cardiologist: [name], MD HF Cardiologist: Dr. [name]  Reason for visit: f/u for HF   HPI: [name] is an 83 y.o.[name] female with HTN and PAF, MR and systolic heart failure.    Admitted 2/24 for further MR workup. She underwent L/RHC which showed [facility] 2/25 with no CAD. RA 8, PA 41/17 (14), Fick CO/CI 7.76/4.41, PAPi 3, LVEDP 11. Following her heart cath she went into a fib RVR, asymptomatic but new. TEE confirmed severe MR from posterior leaflet prolapse/flail and mod TR with EF 60%. Was initially set up for DCCV but chemically converted and was able to transition to PO amiodarone . Discharged with plans for MVR the following month with Dr. [name].    Admitted 02/08/24 for MVR, MAZE and LAA clipping. Intra-Op TEE with EF 55-60% and severe MR. Post-op course complicated by persistent a fib RVR and vasoplegic shock requiring vasopressors. Able to extubate the following day. Pressors weaned off.  AHF team consulted after echo showed new drop in EF to 35-40% with persistent a fib and volume overload. Diuresed, able to wean drips and GDMT titrated but limited by soft BP. Underwent L thoracentesis 02/27/24.  Discharged to SNF, weight 145 lbs.  Echo 03/21/24: EF 55-60%, LV no RWMA, RV normal, trivial MR, mod TR.   She presents back today for 2 wk f/u. At last visit, she was felt to be mildly volume overloaded. Torsemide  was increased from 40 mg bid to 60 mg qam/40 mg qpm. Also instructed to take a dose of metolazone  2.5 mg x 1. Since last visit, her wt is down 9 lb from 135>>126 lb. ReDs 24%.   She says she feels better. She no longer has pitting edema present but continues to wear compression stockings during the day. She takes them off at night and says that her legs ache/cramp when she takes them off. Feels better w/ compression. She has varicose veins on exam.   Functional status improving, NYHA Class II. No chest pain. She  denies any palpitations/symptoms of afib. No abnormal bleeding w/ Eliquis .   Since her last visit here, she had f/u w/ her PCP, Dr. [name]. She had elevation in SCr/BUN w/ diuretic increase and diuretics were subsequently reduced. She is now taking torsemide  40 mg once daily and wt has remained stable at home. She had repeat labs done in his office yesterday and her SCr had improve from 1.68>>1.4. K was 4.6.     Wt Readings from Last 3 Encounters:  04/20/24 57.3 kg (126 lb 6.4 oz)  04/13/24 56.2 kg (123 lb 12.8 oz)  04/05/24 61.3 kg (135 lb 3.2 oz)      Cardiac Studies - Echo 03/21/24: EF 55-60%, LV no RWMA, RV normal, trivial MR, mod TR.  - Echo (02/14/24): EF 35-40%, LV with GHK, RV normal, LA mod dilated, RA severely dilated, trivial MR, mod TR - Intra-Op TEE 4/25: EF 55-60%, severe MR - TEE 2/25: LVEF 60-65%, no LAA thrombus, severe MR from posterior leaflet prolapse/flail and moderate TR - R/LHC 2/25: no CAD, RA 8, PA 41/17 (30), PCWP 14, CO/CI (Fick) 7.76/4.41  Past Medical History:  Diagnosis Date   Acid reflux    Anemia    Arthritis    Atrial fibrillation (HCC)    Cataract    Diverticulosis of colon    Endometrial cancer (HCC)    1A grade endometrial ca   Essential hypertension 01/24/2009   Qualifier:  Diagnosis of  By: Nelson-Smith CMA (AAMA), Dottie     Hx of radiation therapy 04/24/08-05/29/08& 8/12/,8/19,&06/27/08   external beam  through 7/09 ,then intracavity in 06/2008   Hypertension    Mitral valve prolapse    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   Tricuspid regurgitation    Current Outpatient Medications  Medication Sig Dispense Refill   acetaminophen  (TYLENOL ) 500 MG tablet Take 1-2 tablets (500-1,000 mg total) by mouth every 6 (six) hours as needed for mild pain (pain score 1-3) or fever.     amiodarone  (PACERONE ) 200 MG tablet Take 1 tablet (200 mg total) by mouth daily. 30 tablet 0   apixaban  (ELIQUIS ) 2.5 MG TABS tablet Take 1 tablet (2.5 mg total) by mouth  2 (two) times daily. 60 tablet 11   atorvastatin  (LIPITOR) 10 MG tablet Take 1 tablet (10 mg total) by mouth daily. 90 tablet 3   b complex vitamins tablet Take 1 tablet by mouth daily.       CALCIUM  PO Take 650 mg by mouth daily.     Cholecalciferol (VITAMIN D3) 125 MCG (5000 UT) CAPS Take 5,000 Units by mouth daily.     docusate sodium  (COLACE) 100 MG capsule Take 1 capsule (100 mg total) by mouth 2 (two) times daily as needed for moderate constipation.     magnesium  gluconate (MAGONATE) 500 (27 Mg) MG TABS tablet Take 1 tablet (500 mg total) by mouth daily.     melatonin 3 MG TABS tablet Take 1 tablet (3 mg total) by mouth at bedtime.     Multiple Vitamins-Minerals (MULTIVITAMINS THER. W/MINERALS) TABS Take 1 tablet by mouth daily.       phenol (CHLORASEPTIC) 1.4 % LIQD Use as directed 1 spray in the mouth or throat as needed for throat irritation / pain.     potassium chloride  SA (KLOR-CON  M) 20 MEQ tablet Take 2 tablets (40 mEq total) by mouth daily. Take extra 2 tabs when you take metolazone      psyllium (HYDROCIL/METAMUCIL) 95 % PACK Take 1 packet by mouth daily.     spironolactone  (ALDACTONE ) 25 MG tablet Take 1 tablet (25 mg total) by mouth daily.     Torsemide  40 MG TABS Take by mouth.     witch hazel-glycerin  (TUCKS) pad Apply topically as needed for itching.     Zinc 50 MG TABS Take 50 mg by mouth daily.     No current facility-administered medications for this encounter.   Allergies  Allergen Reactions   Crestor [Rosuvastatin] Other (See Comments)    Leg cramps   Glycopyrrolate      Other reaction(s): extreme dry mouth   Codeine Other (See Comments)    Hyper   Social History   Socioeconomic History   Marital status: Married    Spouse name: Not on file   Number of children: 1   Years of education: Not on file   Highest education level: Not on file  Occupational History   Occupation: Environmental health practitioner  Tobacco Use   Smoking status: Never   Smokeless tobacco:  Never  Vaping Use   Vaping status: Never Used  Substance and Sexual Activity   Alcohol use: Yes    Comment: rarely   Drug use: No   Sexual activity: Never  Other Topics Concern   Not on file  Social History Narrative   Not on file   Social Drivers of Health   Financial Resource Strain: Not on file  Food Insecurity: No Food Insecurity (02/08/2024)  Hunger Vital Sign    Worried About Running Out of Food in the Last Year: Never true    Ran Out of Food in the Last Year: Never true  Transportation Needs: No Transportation Needs (02/08/2024)   PRAPARE - Administrator, Civil Service (Medical): No    Lack of Transportation (Non-Medical): No  Physical Activity: Not on file  Stress: Not on file  Social Connections: Socially Integrated (02/09/2024)   Social Connection and Isolation Panel    Frequency of Communication with Friends and Family: More than three times a week    Frequency of Social Gatherings with Friends and Family: More than three times a week    Attends Religious Services: More than 4 times per year    Active Member of [employer] or Organizations: Yes    Attends Banker Meetings: More than 4 times per year    Marital Status: Married  Catering manager Violence: Not At Risk (02/08/2024)   Humiliation, Afraid, Rape, and Kick questionnaire    Fear of Current or Ex-Partner: No    Emotionally Abused: No    Physically Abused: No    Sexually Abused: No   Family History  Problem Relation Age of Onset   Diabetes type II Mother    Breast cancer Mother    Leukemia Father    Diabetes type II Sister    Breast cancer Sister    Cancer Brother 40       throat 10/2020   Pancreatic cancer Maternal Aunt        x2   Diabetes Maternal Grandmother    Diabetes Maternal Grandfather    Colon cancer Neg Hx    Stomach cancer Neg Hx    Esophageal cancer Neg Hx    Wt Readings from Last 3 Encounters:  04/20/24 57.3 kg (126 lb 6.4 oz)  04/13/24 56.2 kg (123 lb 12.8 oz)   04/05/24 61.3 kg (135 lb 3.2 oz)   BP 110/60   Pulse 93   Ht 5' 4 (1.626 m)   Wt 57.3 kg (126 lb 6.4 oz)   SpO2 96%   BMI 21.70 kg/m   PHYSICAL EXAM: General:  Well appearing, elderly. No respiratory difficulty HEENT: normal Neck: supple. no JVD. Carotids 2+ bilat; no bruits. No lymphadenopathy or thyromegaly appreciated. Cor: PMI nondisplaced. Regular rate & rhythm. No rubs, gallops or murmurs. Lungs: clear Abdomen: soft, nontender, nondistended. No hepatosplenomegaly. No bruits or masses. Good bowel sounds. Extremities: no cyanosis, clubbing, rash, + TED hoses, no pitting edema, + b/l varicose veins  Neuro: alert & oriented x 3, cranial nerves grossly intact. moves all 4 extremities w/o difficulty. Affect pleasant.   ECG : not performed today   ReDs reading: 24%, normal   ASSESSMENT & PLAN: Chronic systolic heart failure, NiCM - R/LHC 2/25 with no CAD. RA 8, PA 41/17 (14), Fick CO/CI 7.76/4.41, PAPi 3, LVEDP 11 - Intra-op TEE (at time of MVR) 02/08/24 with EF 55-60%  - Echo 02/14/24: EF 35-40%, LV with GHK, RV normal, LA mod dilated, RA severely dilated, trivial MR, mod TR - Echo 03/21/24: EF 55-60%, LV no RWMA, RV normal, trivial MR, mod TR.  - Suspect previous drop in EF was 2/2 persistent AF RVR.  - Functional status improving, now NYHA Class II. Wt down 9 lb from last vist. ReDs 24%. Euvolemic on exam  - Continue torsemide  40 mg daily. Reviewed labs from PCP office yesterday, SCr 1.4, K 4.6   - Continue spiro 25  mg daily - Off SGLT2i with UTIs - Continue compression hose.     Atrial fibrillation  - S/p Maze and LAA clipping 4/25 - Failed DCCV X 2 02/16/24 - RRR on exam  - Stop Amio (recent labs 6/13 showed elevated TSH (6.0) and Free T4 (16). AST and ALT also mildly elevated, 43/47). She has return f/u w/ PCP next wk. I instructed her to discuss f/u on TFTs and HFTs  - she will notify us  if she has any symptoms of afib (symptomatic w/ previous episodes)  - Continue  Eliquis  2.5 mg bid (reduced dose given age and SCr). Denies abnormal bleeding. Recent H/H stable   Severe MR - s/p MVR - Trivial MR on echo 02/14/24 - Echo 5/21 with trivial MR - no appreciable murmur heard on exam today   4. Venous Insuffiencey  - symptomatic, legs ache. Bilateral  + varicose veins on exam  - continue daytime compression stockings + elevation when able - will refer to [facility]     Keep scheduled f/u w/ Dr. [name] in August.    Akeila Lana Elissa Guise 04/20/24

## 2024-04-20 NOTE — Telephone Encounter (Signed)
 Called patient advised of below they verbalized understanding.

## 2024-04-24 DIAGNOSIS — I10 Essential (primary) hypertension: Secondary | ICD-10-CM | POA: Diagnosis not present

## 2024-04-24 DIAGNOSIS — F419 Anxiety disorder, unspecified: Secondary | ICD-10-CM | POA: Diagnosis not present

## 2024-04-24 DIAGNOSIS — I2510 Atherosclerotic heart disease of native coronary artery without angina pectoris: Secondary | ICD-10-CM | POA: Diagnosis not present

## 2024-04-24 DIAGNOSIS — I4891 Unspecified atrial fibrillation: Secondary | ICD-10-CM | POA: Diagnosis not present

## 2024-04-24 DIAGNOSIS — L89152 Pressure ulcer of sacral region, stage 2: Secondary | ICD-10-CM | POA: Diagnosis not present

## 2024-04-24 DIAGNOSIS — Z48812 Encounter for surgical aftercare following surgery on the circulatory system: Secondary | ICD-10-CM | POA: Diagnosis not present

## 2024-04-24 DIAGNOSIS — I272 Pulmonary hypertension, unspecified: Secondary | ICD-10-CM | POA: Diagnosis not present

## 2024-04-24 DIAGNOSIS — G4700 Insomnia, unspecified: Secondary | ICD-10-CM | POA: Diagnosis not present

## 2024-04-24 DIAGNOSIS — F331 Major depressive disorder, recurrent, moderate: Secondary | ICD-10-CM | POA: Diagnosis not present

## 2024-04-25 DIAGNOSIS — R7989 Other specified abnormal findings of blood chemistry: Secondary | ICD-10-CM | POA: Diagnosis not present

## 2024-04-25 DIAGNOSIS — L89152 Pressure ulcer of sacral region, stage 2: Secondary | ICD-10-CM | POA: Diagnosis not present

## 2024-04-25 DIAGNOSIS — I2510 Atherosclerotic heart disease of native coronary artery without angina pectoris: Secondary | ICD-10-CM | POA: Diagnosis not present

## 2024-04-25 DIAGNOSIS — Z48812 Encounter for surgical aftercare following surgery on the circulatory system: Secondary | ICD-10-CM | POA: Diagnosis not present

## 2024-04-25 DIAGNOSIS — F419 Anxiety disorder, unspecified: Secondary | ICD-10-CM | POA: Diagnosis not present

## 2024-04-25 DIAGNOSIS — I10 Essential (primary) hypertension: Secondary | ICD-10-CM | POA: Diagnosis not present

## 2024-04-25 DIAGNOSIS — I509 Heart failure, unspecified: Secondary | ICD-10-CM | POA: Diagnosis not present

## 2024-04-25 DIAGNOSIS — L89302 Pressure ulcer of unspecified buttock, stage 2: Secondary | ICD-10-CM | POA: Diagnosis not present

## 2024-04-25 DIAGNOSIS — I4891 Unspecified atrial fibrillation: Secondary | ICD-10-CM | POA: Diagnosis not present

## 2024-04-25 DIAGNOSIS — G4700 Insomnia, unspecified: Secondary | ICD-10-CM | POA: Diagnosis not present

## 2024-04-25 DIAGNOSIS — F331 Major depressive disorder, recurrent, moderate: Secondary | ICD-10-CM | POA: Diagnosis not present

## 2024-04-25 DIAGNOSIS — I272 Pulmonary hypertension, unspecified: Secondary | ICD-10-CM | POA: Diagnosis not present

## 2024-04-25 DIAGNOSIS — I5023 Acute on chronic systolic (congestive) heart failure: Secondary | ICD-10-CM | POA: Diagnosis not present

## 2024-04-26 ENCOUNTER — Encounter: Payer: Self-pay | Admitting: Cardiology

## 2024-04-26 DIAGNOSIS — I272 Pulmonary hypertension, unspecified: Secondary | ICD-10-CM | POA: Diagnosis not present

## 2024-04-26 DIAGNOSIS — F331 Major depressive disorder, recurrent, moderate: Secondary | ICD-10-CM | POA: Diagnosis not present

## 2024-04-26 DIAGNOSIS — F419 Anxiety disorder, unspecified: Secondary | ICD-10-CM | POA: Diagnosis not present

## 2024-04-26 DIAGNOSIS — I10 Essential (primary) hypertension: Secondary | ICD-10-CM | POA: Diagnosis not present

## 2024-04-26 DIAGNOSIS — G4700 Insomnia, unspecified: Secondary | ICD-10-CM | POA: Diagnosis not present

## 2024-04-26 DIAGNOSIS — I4891 Unspecified atrial fibrillation: Secondary | ICD-10-CM | POA: Diagnosis not present

## 2024-04-26 DIAGNOSIS — L89152 Pressure ulcer of sacral region, stage 2: Secondary | ICD-10-CM | POA: Diagnosis not present

## 2024-04-26 DIAGNOSIS — Z48812 Encounter for surgical aftercare following surgery on the circulatory system: Secondary | ICD-10-CM | POA: Diagnosis not present

## 2024-04-26 DIAGNOSIS — I2510 Atherosclerotic heart disease of native coronary artery without angina pectoris: Secondary | ICD-10-CM | POA: Diagnosis not present

## 2024-04-26 NOTE — Telephone Encounter (Signed)
 Patient sent a pt schedule message in regard to this. Please advise.

## 2024-04-27 ENCOUNTER — Telehealth: Payer: Self-pay | Admitting: Cardiology

## 2024-04-27 DIAGNOSIS — Z0279 Encounter for issue of other medical certificate: Secondary | ICD-10-CM

## 2024-04-27 NOTE — Telephone Encounter (Signed)
 Patient brought in two forms today.  One is an Transport planner and the other is for her disability.  Patient signed the releases of information and paid $58 for the form charges. Forms are in Dr. Godfrey box.

## 2024-05-01 ENCOUNTER — Ambulatory Visit (HOSPITAL_COMMUNITY)
Admission: RE | Admit: 2024-05-01 | Discharge: 2024-05-01 | Disposition: A | Discharge status: Home / Self Care | Source: Ambulatory Visit | Attending: Internal Medicine | Admitting: Internal Medicine

## 2024-05-01 VITALS — BP 112/62 | HR 74 | Ht 64.0 in | Wt 129.8 lb

## 2024-05-01 DIAGNOSIS — I2510 Atherosclerotic heart disease of native coronary artery without angina pectoris: Secondary | ICD-10-CM | POA: Diagnosis not present

## 2024-05-01 DIAGNOSIS — Z48812 Encounter for surgical aftercare following surgery on the circulatory system: Secondary | ICD-10-CM | POA: Diagnosis not present

## 2024-05-01 DIAGNOSIS — Z79899 Other long term (current) drug therapy: Secondary | ICD-10-CM | POA: Diagnosis not present

## 2024-05-01 DIAGNOSIS — I480 Paroxysmal atrial fibrillation: Secondary | ICD-10-CM | POA: Diagnosis not present

## 2024-05-01 DIAGNOSIS — F331 Major depressive disorder, recurrent, moderate: Secondary | ICD-10-CM | POA: Diagnosis not present

## 2024-05-01 DIAGNOSIS — I4891 Unspecified atrial fibrillation: Secondary | ICD-10-CM | POA: Diagnosis not present

## 2024-05-01 DIAGNOSIS — D6869 Other thrombophilia: Secondary | ICD-10-CM | POA: Diagnosis not present

## 2024-05-01 DIAGNOSIS — Z5181 Encounter for therapeutic drug level monitoring: Secondary | ICD-10-CM | POA: Diagnosis not present

## 2024-05-01 DIAGNOSIS — G4700 Insomnia, unspecified: Secondary | ICD-10-CM | POA: Diagnosis not present

## 2024-05-01 DIAGNOSIS — L89152 Pressure ulcer of sacral region, stage 2: Secondary | ICD-10-CM | POA: Diagnosis not present

## 2024-05-01 DIAGNOSIS — I272 Pulmonary hypertension, unspecified: Secondary | ICD-10-CM | POA: Diagnosis not present

## 2024-05-01 DIAGNOSIS — F419 Anxiety disorder, unspecified: Secondary | ICD-10-CM | POA: Diagnosis not present

## 2024-05-01 DIAGNOSIS — I10 Essential (primary) hypertension: Secondary | ICD-10-CM | POA: Diagnosis not present

## 2024-05-01 NOTE — Progress Notes (Addendum)
 Primary Care Physician: Clarice Nottingham, MD Primary Cardiologist: Gordy Bergamo, MD Electrophysiologist: None     Referring Physician: Dr. Bergamo [name] is a 83 y.o. female with a history of HTN, severe MR s/p MVR, Maze procedure, and LAA clipping, and paroxysmal atrial fibrillation who presents for consultation in the [facility]. Patient contacted clinic on 6/27 noting ongoing issue with lower legs since surgery on 4/9, dizziness, fluid retention, and Afib episode. Amiodarone  stopped at previous cardiology visit on 6/20 due to abnormal labs (on amiodarone  since post-heart cath Afib in February). Patient is on Eliquis  2.5 mg BID for a CHADS2VASC score of 4.  On evaluation today, she is currently in NSR. Patient noted she had an episode of Afib last week for 5-10 minutes and her HR was in the 120s. She restarted amiodarone  200 mg daily because in addition she felt other episodes of brief palpitations; she has not had Afib since restarting amiodarone . No missed doses of Eliquis .   Today, she denies symptoms of chest pain, shortness of breath, orthopnea, PND, lower extremity edema, dizziness, presyncope, syncope, snoring, daytime somnolence, bleeding, or neurologic sequela. The patient is tolerating medications without difficulties and is otherwise without complaint today.    she has a BMI of Body mass index is 22.28 kg/m.SABRA Filed Weights   05/01/24 0947  Weight: 58.9 kg    Current Outpatient Medications  Medication Sig Dispense Refill   acetaminophen  (TYLENOL ) 500 MG tablet Take 1-2 tablets (500-1,000 mg total) by mouth every 6 (six) hours as needed for mild pain (pain score 1-3) or fever. (Patient taking differently: Take 500-1,000 mg by mouth as needed for mild pain (pain score 1-3) or fever.)     amiodarone  (PACERONE ) 200 MG tablet Take 200 mg by mouth daily.     apixaban  (ELIQUIS ) 2.5 MG TABS tablet Take 1 tablet (2.5 mg total) by mouth 2 (two) times  daily. 60 tablet 11   atorvastatin  (LIPITOR) 10 MG tablet Take 1 tablet (10 mg total) by mouth daily. 90 tablet 3   b complex vitamins tablet Take 1 tablet by mouth daily.       CALCIUM  PO Take 650 mg by mouth daily.     Cholecalciferol (VITAMIN D3) 125 MCG (5000 UT) CAPS Take 5,000 Units by mouth daily.     magnesium  gluconate (MAGONATE) 500 (27 Mg) MG TABS tablet Take 1 tablet (500 mg total) by mouth daily.     melatonin 3 MG TABS tablet Take 1 tablet (3 mg total) by mouth at bedtime.     Misc Natural Products (GLUCOSAMINE CHOND MSM FORMULA) TABS Take 1 tablet by mouth as needed.     Multiple Vitamins-Minerals (MULTIVITAMINS THER. W/MINERALS) TABS Take 1 tablet by mouth daily.       neomycin-polymyxin b-dexamethasone  (MAXITROL) 3.5-10000-0.1 SUSP INSTILL 1 DROP 4 TIMES A DAY IN LEFT EYE FOR 1 WEEK THEN TWICE A DAY FOR 2ND WEEK     phenol (CHLORASEPTIC) 1.4 % LIQD Use as directed 1 spray in the mouth or throat as needed for throat irritation / pain.     potassium chloride  SA (KLOR-CON  M) 20 MEQ tablet Take 2 tablets (40 mEq total) by mouth daily. Take extra 2 tabs when you take metolazone      Torsemide  40 MG TABS Take by mouth. (Patient taking differently: Takes 20 mg bid daily)     witch hazel-glycerin  (TUCKS) pad Apply topically as needed for itching.  Zinc 50 MG TABS Take 50 mg by mouth daily.     No current facility-administered medications for this encounter.    Atrial Fibrillation Management history:  Previous antiarrhythmic drugs: amiodarone  Previous cardioversions: 02/17/24 Previous ablations: none Anticoagulation history: Eliquis    ROS- All systems are reviewed and negative except as per the HPI above.  Physical Exam: BP 112/62   Pulse 74   Ht 5' 4 (1.626 m)   Wt 58.9 kg   BMI 22.28 kg/m   GEN: Well nourished, well developed in no acute distress NECK: No JVD; No carotid bruits CARDIAC: Regular rate and rhythm, no murmurs, rubs, gallops RESPIRATORY:  Clear to  auscultation without rales, wheezing or rhonchi  ABDOMEN: Soft, non-tender, non-distended EXTREMITIES:  No edema; No deformity   EKG today demonstrates  Vent. rate 74 BPM PR interval 174 ms QRS duration 98 ms QT/QTcB 438/486 ms P-R-T axes 79 56 28 Normal sinus rhythm with sinus arrhythmia Nonspecific T wave abnormality Abnormal ECG When compared with ECG of 13-Apr-2024 11:44, Nonspecific T wave abnormality has replaced inverted T waves in Inferior leads  Echo 02/14/24: 1. Left ventricular ejection fraction, by estimation, is 35 to 40%. The  left ventricle has moderately decreased function. The left ventricle  demonstrates global hypokinesis.   2. Right ventricular systolic function is normal. The right ventricular  size is mildly enlarged.   3. Left atrial size was moderately dilated.   4. Right atrial size was severely dilated.   5. Large pleural effusion in the left lateral region.   6. The mitral valve has been repaired/replaced. Trivial mitral valve  regurgitation. Mild mitral stenosis. The mean mitral valve gradient is 4.0  mmHg. There is a present in the mitral position. Procedure Date: 02/08/24.   7. Tricuspid valve regurgitation is moderate.   8. The aortic valve is normal in structure. There is moderate  calcification of the aortic valve. Aortic valve regurgitation is not  visualized. Aortic valve sclerosis is present, with no evidence of aortic  valve stenosis. Aortic valve Vmax measures 1.31  m/s.   9. The inferior vena cava is normal in size with greater than 50%  respiratory variability, suggesting right atrial pressure of 3 mmHg.   Echo 03/21/24 demonstrated  1. Left ventricular ejection fraction, by estimation, is 55 to 60%. The  left ventricle has normal function. The left ventricle has no regional  wall motion abnormalities. Left ventricular diastolic function could not  be evaluated. The global longitudinal   strain is normal.   2. Right ventricular systolic  function is normal. The right ventricular  size is normal.   3. The mitral valve has been repaired/replaced. Trivial mitral valve  regurgitation. The mean mitral valve gradient is 3.0 mmHg. There is a 30  mm prosthetic annuloplasty ring present in the mitral position.   4. Tricuspid valve regurgitation is moderate.   5. The aortic valve is normal in structure. Aortic valve regurgitation is  not visualized.    ASSESSMENT & PLAN CHA2DS2-VASc Score = 4  The patient's score is based upon: CHF History: 0 HTN History: 1 Diabetes History: 0 Stroke History: 0 Vascular Disease History: 0 Age Score: 2 Gender Score: 1       ASSESSMENT AND PLAN: Paroxysmal Atrial Fibrillation (ICD10:  I48.0) The patient's CHA2DS2-VASc score is 4, indicating a 4.8% annual risk of stroke.    She is currently in NSR. We had a discussion with patient resuming amiodarone  in light of abnormal TSH level and slightly elevated  LFTs. We discussed other treatment such as ablation or AAD therapy. After discussion, patient wishes to continue amiodarone . I will have patient follow up in several months to reassess. I advised patient if labs remain abnormal then will have to discontinue amiodarone  and consider other strategy for rhythm control. Patient is in agreement. She has upcoming appt with to discuss thyroid  level with PCP.   CrCl 24 mL/min  High risk medication monitoring (ICD10: Z79.899) Patient requires ongoing monitoring for anti-arrhythmic medication which has the potential to cause life threatening arrhythmias or AV block. Qtc stable. Continue amiodarone  200 mg daily.   Secondary Hypercoagulable State (ICD10:  D68.69) The patient is at significant risk for stroke/thromboembolism based upon her CHA2DS2-VASc Score of 4.  Continue Apixaban  (Eliquis ).  Dosage of 2.5 mg is correct based on age, weight < 60 kg, and creatinine > 1.5. Continue without interruption.    Follow up 6 months Afib clinic.   Terra Pac, PA-C  Af[facility] Clinic [facility] [address] [place], [place] [address] [phone]

## 2024-05-02 DIAGNOSIS — Z48812 Encounter for surgical aftercare following surgery on the circulatory system: Secondary | ICD-10-CM | POA: Diagnosis not present

## 2024-05-02 DIAGNOSIS — F331 Major depressive disorder, recurrent, moderate: Secondary | ICD-10-CM | POA: Diagnosis not present

## 2024-05-02 DIAGNOSIS — I10 Essential (primary) hypertension: Secondary | ICD-10-CM | POA: Diagnosis not present

## 2024-05-02 DIAGNOSIS — L89152 Pressure ulcer of sacral region, stage 2: Secondary | ICD-10-CM | POA: Diagnosis not present

## 2024-05-02 DIAGNOSIS — F419 Anxiety disorder, unspecified: Secondary | ICD-10-CM | POA: Diagnosis not present

## 2024-05-02 DIAGNOSIS — I272 Pulmonary hypertension, unspecified: Secondary | ICD-10-CM | POA: Diagnosis not present

## 2024-05-02 DIAGNOSIS — I2510 Atherosclerotic heart disease of native coronary artery without angina pectoris: Secondary | ICD-10-CM | POA: Diagnosis not present

## 2024-05-02 DIAGNOSIS — I4891 Unspecified atrial fibrillation: Secondary | ICD-10-CM | POA: Diagnosis not present

## 2024-05-02 DIAGNOSIS — G4700 Insomnia, unspecified: Secondary | ICD-10-CM | POA: Diagnosis not present

## 2024-05-03 DIAGNOSIS — Z48812 Encounter for surgical aftercare following surgery on the circulatory system: Secondary | ICD-10-CM | POA: Diagnosis not present

## 2024-05-03 DIAGNOSIS — I4891 Unspecified atrial fibrillation: Secondary | ICD-10-CM | POA: Diagnosis not present

## 2024-05-03 DIAGNOSIS — L89152 Pressure ulcer of sacral region, stage 2: Secondary | ICD-10-CM | POA: Diagnosis not present

## 2024-05-03 DIAGNOSIS — E876 Hypokalemia: Secondary | ICD-10-CM | POA: Diagnosis not present

## 2024-05-03 DIAGNOSIS — I10 Essential (primary) hypertension: Secondary | ICD-10-CM | POA: Diagnosis not present

## 2024-05-08 DIAGNOSIS — I10 Essential (primary) hypertension: Secondary | ICD-10-CM | POA: Diagnosis not present

## 2024-05-08 DIAGNOSIS — F331 Major depressive disorder, recurrent, moderate: Secondary | ICD-10-CM | POA: Diagnosis not present

## 2024-05-08 DIAGNOSIS — G4700 Insomnia, unspecified: Secondary | ICD-10-CM | POA: Diagnosis not present

## 2024-05-08 DIAGNOSIS — L89152 Pressure ulcer of sacral region, stage 2: Secondary | ICD-10-CM | POA: Diagnosis not present

## 2024-05-08 DIAGNOSIS — I272 Pulmonary hypertension, unspecified: Secondary | ICD-10-CM | POA: Diagnosis not present

## 2024-05-08 DIAGNOSIS — F419 Anxiety disorder, unspecified: Secondary | ICD-10-CM | POA: Diagnosis not present

## 2024-05-08 DIAGNOSIS — I4891 Unspecified atrial fibrillation: Secondary | ICD-10-CM | POA: Diagnosis not present

## 2024-05-08 DIAGNOSIS — I2510 Atherosclerotic heart disease of native coronary artery without angina pectoris: Secondary | ICD-10-CM | POA: Diagnosis not present

## 2024-05-08 DIAGNOSIS — Z48812 Encounter for surgical aftercare following surgery on the circulatory system: Secondary | ICD-10-CM | POA: Diagnosis not present

## 2024-05-10 DIAGNOSIS — E876 Hypokalemia: Secondary | ICD-10-CM | POA: Diagnosis not present

## 2024-05-10 LAB — LAB REPORT - SCANNED: EGFR: 41

## 2024-05-11 NOTE — Telephone Encounter (Signed)
 Both the disability and FMLA forms were faxed to [facility] and scanned into chart. Billing and patient notified.

## 2024-05-14 ENCOUNTER — Telehealth (HOSPITAL_COMMUNITY): Payer: Self-pay

## 2024-05-14 ENCOUNTER — Encounter (HOSPITAL_COMMUNITY): Payer: Self-pay

## 2024-05-14 NOTE — Telephone Encounter (Signed)
Attempted to call patient in regards to Cardiac Rehab - LM on VM   Sent letter 

## 2024-05-14 NOTE — Progress Notes (Signed)
 Cardiology Office Note:  .   Date:  05/28/2024  ID:  [name], DOB [birth year], MRN [id number] PCP: Clarice Nottingham, MD  [facility] Providers Cardiologist:  Gordy Bergamo, MD    History of Present Illness: .   [name] is a 83 y.o. 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 Patient contacted clinic on 6/27 noting ongoing issue with lower legs since surgery on 4/9, dizziness, fluid retention, and Afib episode. Amiodarone  stopped at previous cardiology visit on 6/20 due to abnormal labs (on amiodarone  since post-heart cath Afib in February). Patient is on Eliquis  2.5 mg BID for a CHADS2VASC score of 4.  Patient seen in Afib clinic 05/01/24 and in NSR. She restarted amiodarone  and was continued for now unless labs remain abnormal.  Patient comes in for f/u. Has chronic LE edema wearing compression hose. Scheduled for an echo in Aug and f/u in AHF. TSH being followed by PCP and was only slightly elevated. Starts cardiac rehab today. Walking a lot, sometimes without her cane.      ROS:    Studies Reviewed: SABRA         Prior CV Studies:   Echo 02/14/24: 1. Left ventricular ejection fraction, by estimation, is 35 to 40%. The  left ventricle has moderately decreased function. The left ventricle  demonstrates global hypokinesis.   2. Right ventricular systolic function is normal. The right ventricular  size is mildly enlarged.   3. Left atrial size was moderately dilated.   4. Right atrial size was severely dilated.   5. Large pleural effusion in the left lateral region.   6. The mitral valve has been repaired/replaced. Trivial mitral valve  regurgitation. Mild mitral stenosis. The mean mitral valve gradient is 4.0  mmHg. There is a present in the mitral position. Procedure Date: 02/08/24.   7. Tricuspid valve regurgitation is moderate.   8. The aortic valve is normal in structure. There is moderate   calcification of the aortic valve. Aortic valve regurgitation is not  visualized. Aortic valve sclerosis is present, with no evidence of aortic  valve stenosis. Aortic valve Vmax measures 1.31  m/s.   9. The inferior vena cava is normal in size with greater than 50%  respiratory variability, suggesting right atrial pressure of 3 mmHg.    Echo 03/21/24 demonstrated  1. Left ventricular ejection fraction, by estimation, is 55 to 60%. The  left ventricle has normal function. The left ventricle has no regional  wall motion abnormalities. Left ventricular diastolic function could not  be evaluated. The global longitudinal   strain is normal.   2. Right ventricular systolic function is normal. The right ventricular  size is normal.   3. The mitral valve has been repaired/replaced. Trivial mitral valve  regurgitation. The mean mitral valve gradient is 3.0 mmHg. There is a 30  mm prosthetic annuloplasty ring present in the mitral position.   4. Tricuspid valve regurgitation is moderate.   5. The aortic valve is normal in structure. Aortic valve regurgitation is  not visualized.    R/L heart cath 12/2023 Angiographic data: RCA: Superdominant vessel.  It is tortuous in the midsegment.  Otherwise smooth and normal. LM: Large-caliber vessel smooth and normal. LAD: Ends before reaching the LV apex, gives origin to large D1 with secondary branches, has minimal disease in the midsegment. LCx: Small vessel, small OM1.      Impression and recommendations:  Findings suggestive of severe MR.  Will await on formal reports on TEE and make final recommendations.  Risk Assessment/Calculations:    CHA2DS2-VASc Score = 4   This indicates a 4.8% annual risk of stroke. The patient's score is based upon: CHF History: 0 HTN History: 1 Diabetes History: 0 Stroke History: 0 Vascular Disease History: 0 Age Score: 2 Gender Score: 1            Physical Exam:   VS:  BP 110/60 (BP Location: Left Arm,  Patient Position: Sitting, Cuff Size: Normal)   Pulse 75   Ht 5' 4 (1.626 m)   Wt 133 lb (60.3 kg)   SpO2 96%   BMI 22.83 kg/m    Orhtostatics: No data found. Wt Readings from Last 3 Encounters:  05/28/24 133 lb (60.3 kg)  05/22/24 132 lb 11.5 oz (60.2 kg)  05/01/24 129 lb 12.8 oz (58.9 kg)    GEN: Thin, in no acute distress NECK: No JVD; No carotid bruits CARDIAC:  RRR, no murmurs, rubs, gallops RESPIRATORY:  Clear to auscultation without rales, wheezing or rhonchi  ABDOMEN: Soft, non-tender, non-distended EXTREMITIES:  plus 3 edema; No deformity   ASSESSMENT AND PLAN: .    Paroxysmal Atrial Fibrillation (ICD10:  I48.0) The patient's CHA2DS2-VASc score is 4, indicating a 4.8% annual risk of stroke.   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 they had a discussion with patient resuming amiodarone  in light of abnormal TSH level and slightly elevated LFTs. We discussed other treatment such as ablation or AAD therapy. After discussion, patient wishes to continue amiodarone . I . I advised patient if labs remain abnormal then will have to discontinue amiodarone  and consider other strategy for rhythm control. Patient is in agreement. She has upcoming appt with to discuss thyroid  level with PCP.      Secondary Hypercoagulable State (ICD10:  D68.69) The patient is at significant risk for stroke/thromboembolism based upon her CHA2DS2-VASc Score of 4.  Continue Apixaban  (Eliquis ).  Dosage of 2.5 mg is correct based on age, weight < 60 kg, and creatinine > 1.5. Continue without interruption.  Chronic systolic heart failure, [facility]CM-followed in AHF - [facility] 2/25 with no CAD. RA 8, PA 41/17 (14), Fick CO/CI 7.76/4.41, PAPi 3, LVEDP 11 - Intra-op TEE (at time of MVR) 02/08/24 with EF 55-60%  - Echo 02/14/24: EF 35-40%, LV with GHK, RV normal, LA mod dilated, RA severely dilated, trivial MR, mod TR - Echo 03/21/24: EF 55-60%, LV  no RWMA, RV normal, trivial MR, mod TR.  - Suspect previous drop in EF was 2/2 persistent AF RVR.  - Functional status improving, now NYHA Class II. - Continue torsemide  40 mg daily.  - Continue spiro 25 mg daily - Off SGLT2i with UTIs - Continue compression hose.       Severe MR - s/p MVR - Trivial MR on echo 02/14/24 - Echo 5/21 with trivial MR - no appreciable murmur heard on exam today  -starts cardiac rehab today    Venous Insuffiencey  - symptomatic, legs ache. Bilateral  + varicose veins on exam  - continue daytime compression stockings + elevation when able - for LE venous reflux study on Wed            Dispo: f/u in Afib clinic in Nov/Dec  Signed, Olivia Pavy, PA-C

## 2024-05-15 DIAGNOSIS — Z48812 Encounter for surgical aftercare following surgery on the circulatory system: Secondary | ICD-10-CM | POA: Diagnosis not present

## 2024-05-15 DIAGNOSIS — F419 Anxiety disorder, unspecified: Secondary | ICD-10-CM | POA: Diagnosis not present

## 2024-05-15 DIAGNOSIS — I4891 Unspecified atrial fibrillation: Secondary | ICD-10-CM | POA: Diagnosis not present

## 2024-05-15 DIAGNOSIS — I10 Essential (primary) hypertension: Secondary | ICD-10-CM | POA: Diagnosis not present

## 2024-05-15 DIAGNOSIS — I5023 Acute on chronic systolic (congestive) heart failure: Secondary | ICD-10-CM | POA: Diagnosis not present

## 2024-05-15 DIAGNOSIS — L89152 Pressure ulcer of sacral region, stage 2: Secondary | ICD-10-CM | POA: Diagnosis not present

## 2024-05-15 DIAGNOSIS — I2510 Atherosclerotic heart disease of native coronary artery without angina pectoris: Secondary | ICD-10-CM | POA: Diagnosis not present

## 2024-05-15 DIAGNOSIS — G4700 Insomnia, unspecified: Secondary | ICD-10-CM | POA: Diagnosis not present

## 2024-05-15 DIAGNOSIS — F331 Major depressive disorder, recurrent, moderate: Secondary | ICD-10-CM | POA: Diagnosis not present

## 2024-05-15 DIAGNOSIS — I272 Pulmonary hypertension, unspecified: Secondary | ICD-10-CM | POA: Diagnosis not present

## 2024-05-16 ENCOUNTER — Encounter: Payer: Self-pay | Admitting: Internal Medicine

## 2024-05-16 DIAGNOSIS — G2581 Restless legs syndrome: Secondary | ICD-10-CM | POA: Diagnosis not present

## 2024-05-16 DIAGNOSIS — R600 Localized edema: Secondary | ICD-10-CM | POA: Diagnosis not present

## 2024-05-16 DIAGNOSIS — I5023 Acute on chronic systolic (congestive) heart failure: Secondary | ICD-10-CM | POA: Diagnosis not present

## 2024-05-16 DIAGNOSIS — L89152 Pressure ulcer of sacral region, stage 2: Secondary | ICD-10-CM | POA: Diagnosis not present

## 2024-05-17 ENCOUNTER — Other Ambulatory Visit: Payer: Self-pay | Admitting: *Deleted

## 2024-05-17 ENCOUNTER — Encounter: Payer: Self-pay | Admitting: Cardiology

## 2024-05-17 DIAGNOSIS — F331 Major depressive disorder, recurrent, moderate: Secondary | ICD-10-CM | POA: Diagnosis not present

## 2024-05-17 DIAGNOSIS — Z961 Presence of intraocular lens: Secondary | ICD-10-CM | POA: Diagnosis not present

## 2024-05-17 DIAGNOSIS — Z48812 Encounter for surgical aftercare following surgery on the circulatory system: Secondary | ICD-10-CM | POA: Diagnosis not present

## 2024-05-17 DIAGNOSIS — I4891 Unspecified atrial fibrillation: Secondary | ICD-10-CM | POA: Diagnosis not present

## 2024-05-17 DIAGNOSIS — H35371 Puckering of macula, right eye: Secondary | ICD-10-CM | POA: Diagnosis not present

## 2024-05-17 DIAGNOSIS — I872 Venous insufficiency (chronic) (peripheral): Secondary | ICD-10-CM

## 2024-05-17 DIAGNOSIS — G4700 Insomnia, unspecified: Secondary | ICD-10-CM | POA: Diagnosis not present

## 2024-05-17 DIAGNOSIS — L89152 Pressure ulcer of sacral region, stage 2: Secondary | ICD-10-CM | POA: Diagnosis not present

## 2024-05-17 DIAGNOSIS — F419 Anxiety disorder, unspecified: Secondary | ICD-10-CM | POA: Diagnosis not present

## 2024-05-17 DIAGNOSIS — H524 Presbyopia: Secondary | ICD-10-CM | POA: Diagnosis not present

## 2024-05-17 DIAGNOSIS — I10 Essential (primary) hypertension: Secondary | ICD-10-CM | POA: Diagnosis not present

## 2024-05-17 DIAGNOSIS — I272 Pulmonary hypertension, unspecified: Secondary | ICD-10-CM | POA: Diagnosis not present

## 2024-05-17 DIAGNOSIS — I2510 Atherosclerotic heart disease of native coronary artery without angina pectoris: Secondary | ICD-10-CM | POA: Diagnosis not present

## 2024-05-17 MED ORDER — AMIODARONE HCL 200 MG PO TABS: 200.0000 mg | ORAL_TABLET | Freq: Every day | ORAL | 3 refills | Status: DC

## 2024-05-17 NOTE — Telephone Encounter (Signed)
 No diagnosis found.

## 2024-05-17 NOTE — Addendum Note (Signed)
 Addended by: LADONA MILAN on: 05/17/2024 10:04 PM   Modules accepted: Orders

## 2024-05-22 ENCOUNTER — Encounter (HOSPITAL_COMMUNITY)
Admission: RE | Admit: 2024-05-22 | Discharge: 2024-05-22 | Disposition: A | Discharge status: Home / Self Care | Source: Ambulatory Visit | Attending: Cardiology | Admitting: Cardiology

## 2024-05-22 VITALS — BP 90/50 | HR 75 | Ht 64.0 in | Wt 132.7 lb

## 2024-05-22 DIAGNOSIS — Z9889 Other specified postprocedural states: Secondary | ICD-10-CM | POA: Insufficient documentation

## 2024-05-22 NOTE — Progress Notes (Signed)
 Cardiac Individual Treatment Plan  Patient Details  Name: [name] MRN: [id number] Date of Birth: [birth year] Referring Provider:   Flowsheet Row INTENSIVE CARDIAC REHAB ORIENT from 05/22/2024 in [facility]  Referring Provider Ladona Heinz, MD    Initial Encounter Date:  Flowsheet Row INTENSIVE CARDIAC REHAB ORIENT from 05/22/2024 in [facility]  Date 05/22/24    Visit Diagnosis: S/P MVR (mitral valve repair)  S/P TVR (tricuspid valve repair)  Patient's Home Medications on Admission:  Current Outpatient Medications:    acetaminophen  (TYLENOL ) 500 MG tablet, Take 1-2 tablets (500-1,000 mg total) by mouth every 6 (six) hours as needed for mild pain (pain score 1-3) or fever. (Patient taking differently: Take 500-1,000 mg by mouth as needed for mild pain (pain score 1-3) or fever.), Disp: , Rfl:    amiodarone  (PACERONE ) 200 MG tablet, Take 1 tablet (200 mg total) by mouth daily., Disp: 90 tablet, Rfl: 3   apixaban  (ELIQUIS ) 2.5 MG TABS tablet, Take 1 tablet (2.5 mg total) by mouth 2 (two) times daily., Disp: 60 tablet, Rfl: 11   atorvastatin  (LIPITOR) 10 MG tablet, Take 1 tablet (10 mg total) by mouth daily., Disp: 90 tablet, Rfl: 3   b complex vitamins tablet, Take 1 tablet by mouth daily.  , Disp: , Rfl:    CALCIUM  PO, Take 650 mg by mouth daily., Disp: , Rfl:    Cholecalciferol (VITAMIN D3) 125 MCG (5000 UT) CAPS, Take 5,000 Units by mouth daily., Disp: , Rfl:    magnesium  gluconate (MAGONATE) 500 (27 Mg) MG TABS tablet, Take 1 tablet (500 mg total) by mouth daily., Disp: , Rfl:    Misc Natural Products (GLUCOSAMINE CHOND MSM FORMULA) TABS, Take 1 tablet by mouth as needed., Disp: , Rfl:    Multiple Vitamins-Minerals (MULTIVITAMINS THER. W/MINERALS) TABS, Take 1 tablet by mouth daily.  , Disp: , Rfl:    potassium chloride  SA (KLOR-CON  M) 20 MEQ tablet, Take 2 tablets  (40 mEq total) by mouth daily. Take extra 2 tabs when you take metolazone , Disp: , Rfl:    Torsemide  40 MG TABS, Take by mouth. (Patient taking differently: Take 40 mg by mouth 2 (two) times daily.), Disp: , Rfl:    witch hazel-glycerin  (TUCKS) pad, Apply topically as needed for itching., Disp: , Rfl:    Zinc 50 MG TABS, Take 50 mg by mouth daily., Disp: , Rfl:    melatonin 3 MG TABS tablet, Take 1 tablet (3 mg total) by mouth at bedtime. (Patient not taking: Reported on 05/22/2024), Disp: , Rfl:    neomycin-polymyxin b-dexamethasone  (MAXITROL) 3.5-10000-0.1 SUSP, INSTILL 1 DROP 4 TIMES A DAY IN LEFT EYE FOR 1 WEEK THEN TWICE A DAY FOR 2ND WEEK (Patient not taking: Reported on 05/22/2024), Disp: , Rfl:    phenol (CHLORASEPTIC) 1.4 % LIQD, Use as directed 1 spray in the mouth or throat as needed for throat irritation / pain. 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   Tricuspid regurgitation     Tobacco Use: Social History   Tobacco Use  Smoking Status Never  Smokeless Tobacco Never    Labs: Review Flowsheet  More data exists      Latest Ref Rng & Units 02/24/2024 02/25/2024 02/26/2024 02/27/2024 02/28/2024  Labs for ITP Cardiac and Pulmonary Rehab  O2 Saturation % 73.9  78  68.3  73.1  82.9     Capillary Blood Glucose: Lab Results  Component Value Date   GLUCAP 122 (H) 02/22/2024   GLUCAP 136 (H) 02/22/2024   GLUCAP 100 (H) 02/22/2024    GLUCAP 102 (H) 02/22/2024   GLUCAP 133 (H) 02/21/2024     Exercise Target Goals: Exercise Program Goal: Individual exercise prescription set using results from initial 6 min walk test and THRR while considering  patient's activity barriers and safety.   Exercise Prescription Goal: Initial exercise prescription builds to 30-45 minutes a day of aerobic activity, 2-3 days per week.  Home exercise guidelines will be given to patient during program as part of exercise prescription that the participant will acknowledge.  Activity Barriers & Risk Stratification:  Activity Barriers & Cardiac Risk Stratification - 05/22/24 1333       Activity Barriers & Cardiac Risk Stratification   Activity Barriers Arthritis;Assistive Device;Balance Concerns;History of Falls    Cardiac Risk Stratification High          6 Minute Walk:  6 Minute Walk     Row Name 05/22/24 1454         6 Minute Walk   Phase Initial     Distance 747 feet     Walk Time 6 minutes     # of Rest Breaks 0     MPH 1.41     METS 1.25     RPE 11     Perceived Dyspnea  0     VO2 Peak 4.37     Symptoms No     Resting HR 75 bpm     Resting BP 90/50     Resting Oxygen Saturation  96 %     Exercise Oxygen Saturation  during 6 min walk 96 %     Max Ex. HR 88 bpm     Max Ex. BP 106/62     2 Minute Post BP 100/50        Oxygen Initial Assessment:   Oxygen Re-Evaluation:   Oxygen Discharge (Final Oxygen Re-Evaluation):   Initial Exercise Prescription:  Initial Exercise Prescription - 05/22/24 1500       Date of Initial Exercise RX and Referring Provider   Date 05/22/24    Referring Provider Ladona Heinz, MD    Expected Discharge Date 08/17/24      NuStep   Level 1    SPM 85    Minutes 15    METs 1.5      Track   Laps 9    Minutes 15    METs 2.15      Prescription Details   Frequency (times per week) 3    Duration Progress to 30 minutes of continuous aerobic without signs/symptoms of physical  distress      Intensity   THRR 40-80% of Max Heartrate 56-111    Ratings of Perceived Exertion 11-13    Perceived Dyspnea 0-4  Progression   Progression Continue to progress workloads to maintain intensity without signs/symptoms of physical distress.      Resistance Training   Training Prescription Yes    Weight 2 lbs    Reps 10-15          Perform Capillary Blood Glucose checks as needed.  Exercise Prescription Changes:   Exercise Comments:   Exercise Goals and Review:   Exercise Goals     Row Name 05/22/24 1333             Exercise Goals   Increase Physical Activity Yes       Intervention Provide advice, education, support and counseling about physical activity/exercise needs.;Develop an individualized exercise prescription for aerobic and resistive training based on initial evaluation findings, risk stratification, comorbidities and participant's personal goals.       Expected Outcomes Short Term: Attend rehab on a regular basis to increase amount of physical activity.;Long Term: Exercising regularly at least 3-5 days a week.;Long Term: Add in home exercise to make exercise part of routine and to increase amount of physical activity.       Increase Strength and Stamina Yes       Intervention Provide advice, education, support and counseling about physical activity/exercise needs.;Develop an individualized exercise prescription for aerobic and resistive training based on initial evaluation findings, risk stratification, comorbidities and participant's personal goals.       Expected Outcomes Short Term: Increase workloads from initial exercise prescription for resistance, speed, and METs.;Short Term: Perform resistance training exercises routinely during rehab and add in resistance training at home;Long Term: Improve cardiorespiratory fitness, muscular endurance and strength as measured by increased METs and functional capacity ( )       Able to understand and use  rate of perceived exertion (RPE) scale Yes       Intervention Provide education and explanation on how to use RPE scale       Expected Outcomes Short Term: Able to use RPE daily in rehab to express subjective intensity level;Long Term:  Able to use RPE to guide intensity level when exercising independently       Knowledge and understanding of Target Heart Rate Range (THRR) Yes       Intervention Provide education and explanation of THRR including how the numbers were predicted and where they are located for reference       Expected Outcomes Short Term: Able to state/look up THRR;Long Term: Able to use THRR to govern intensity when exercising independently;Short Term: Able to use daily as guideline for intensity in rehab       Able to check pulse independently Yes       Intervention Provide education and demonstration on how to check pulse in carotid and radial arteries.;Review the importance of being able to check your own pulse for safety during independent exercise       Expected Outcomes Short Term: Able to explain why pulse checking is important during independent exercise;Long Term: Able to check pulse independently and accurately       Understanding of Exercise Prescription Yes       Intervention Provide education, explanation, and written materials on patient's individual exercise prescription       Expected Outcomes Short Term: Able to explain program exercise prescription;Long Term: Able to explain home exercise prescription to exercise independently          Exercise Goals Re-Evaluation :   Discharge Exercise Prescription (Final Exercise Prescription Changes):   Nutrition:  Target Goals:  Understanding of nutrition guidelines, daily intake of sodium 1500mg , cholesterol 200mg , calories 30% from fat and 7% or less from saturated fats, daily to have 5 or more servings of fruits and vegetables.  Biometrics:  Pre Biometrics - 05/22/24 1319       Pre Biometrics   Waist Circumference  31.13 inches    Hip Circumference 36.75 inches    Waist to Hip Ratio 0.85 %    Triceps Skinfold 8 mm    % Body Fat 29.6 %    Grip Strength 18 kg    Flexibility 13 in    Single Leg Stand --   Unable to perform          Nutrition Therapy Plan and Nutrition Goals:   Nutrition Assessments:  MEDIFICTS Score Key: >=70 Need to make dietary changes  40-70 Heart Healthy Diet <= 40 Therapeutic Level Cholesterol Diet    Picture Your Plate Scores: <59 Unhealthy dietary pattern with much room for improvement. 41-50 Dietary pattern unlikely to meet recommendations for good health and room for improvement. 51-60 More healthful dietary pattern, with some room for improvement.  >60 Healthy dietary pattern, although there may be some specific behaviors that could be improved.    Nutrition Goals Re-Evaluation:   Nutrition Goals Re-Evaluation:   Nutrition Goals Discharge (Final Nutrition Goals Re-Evaluation):   Psychosocial: Target Goals: Acknowledge presence or absence of significant depression and/or stress, maximize coping skills, provide positive support system. Participant is able to verbalize types and ability to use techniques and skills needed for reducing stress and depression.  Initial Review & Psychosocial Screening:  Initial Psych Review & Screening - 05/22/24 1431       Initial Review   Current issues with None Identified      Family Dynamics   Good Support System? Yes    Comments [name] has excellent support from her husband.      Barriers   Psychosocial barriers to participate in program There are no identifiable barriers or psychosocial needs.      Screening Interventions   Interventions Encouraged to exercise    Expected Outcomes Short Term goal: Identification and review with participant of any Quality of Life or Depression concerns found by scoring the questionnaire.;Long Term goal: The participant improves quality of Life and PHQ9 Scores as seen by post  scores and/or verbalization of changes          Quality of Life Scores:  Quality of Life - 05/22/24 1556       Quality of Life   Select Quality of Life      Quality of Life Scores   Health/Function Pre 28.18 %    Socioeconomic Pre 30 %    Psych/Spiritual Pre 30 %    Family Pre 30 %    GLOBAL Pre 29.23 %         Scores of 19 and below usually indicate a poorer quality of life in these areas.  A difference of  2-3 points is a clinically meaningful difference.  A difference of 2-3 points in the total score of the Quality of Life Index has been associated with significant improvement in overall quality of life, self-image, physical symptoms, and general health in studies assessing change in quality of life.  PHQ-9: Review Flowsheet       05/22/2024 12/20/2023  Depression screen PHQ 2/9  Decreased Interest 0 0  Down, Depressed, Hopeless 0 0  PHQ - 2 Score 0 0  Altered sleeping 3 -  Tired,  decreased energy 1 -  Change in appetite 0 -  Feeling bad or failure about yourself  0 -  Trouble concentrating 0 -  Moving slowly or fidgety/restless 0 -  Suicidal thoughts 0 -  PHQ-9 Score 4 -  Difficult doing work/chores Not difficult at all -   Interpretation of Total Score  Total Score Depression Severity:  1-4 = Minimal depression, 5-9 = Mild depression, 10-14 = Moderate depression, 15-19 = Moderately severe depression, 20-27 = Severe depression   Psychosocial Evaluation and Intervention:   Psychosocial Re-Evaluation:   Psychosocial Discharge (Final Psychosocial Re-Evaluation):   Vocational Rehabilitation: Provide vocational rehab assistance to qualifying candidates.   Vocational Rehab Evaluation & Intervention:  Vocational Rehab - 05/22/24 1432       Initial Vocational Rehab Evaluation & Intervention   Assessment shows need for Vocational Rehabilitation No      Vocational Rehab Re-Evaulation   Comments [name] is currently working and dosn't have any vocational rehab  needs at this time. Cleared to return to work on 06/01/24. Five hours, five days per week. Full-time in january.          Education: Education Goals: Education classes will be provided on a weekly basis, covering required topics. Participant will state understanding/return demonstration of topics presented.     Core Videos: Exercise    Move It!  Clinical staff conducted group or individual video education with verbal and written material and guidebook.  Patient learns the recommended Pritikin exercise program. Exercise with the goal of living a long, healthy life. Some of the health benefits of exercise include controlled diabetes, healthier blood pressure levels, improved cholesterol levels, improved heart and lung capacity, improved sleep, and better body composition. Everyone should speak with their doctor before starting or changing an exercise routine.  Biomechanical Limitations Clinical staff conducted group or individual video education with verbal and written material and guidebook.  Patient learns how biomechanical limitations can impact exercise and how we can mitigate and possibly overcome limitations to have an impactful and balanced exercise routine.  Body Composition Clinical staff conducted group or individual video education with verbal and written material and guidebook.  Patient learns that body composition (ratio of muscle mass to fat mass) is a key component to assessing overall fitness, rather than body weight alone. Increased fat mass, especially visceral belly fat, can put us  at increased risk for metabolic syndrome, type 2 diabetes, heart disease, and even death. It is recommended to combine diet and exercise (cardiovascular and resistance training) to improve your body composition. Seek guidance from your physician and exercise physiologist before implementing an exercise routine.  Exercise Action Plan Clinical staff conducted group or individual video education with  verbal and written material and guidebook.  Patient learns the recommended strategies to achieve and enjoy long-term exercise adherence, including variety, self-motivation, self-efficacy, and positive decision making. Benefits of exercise include fitness, good health, weight management, more energy, better sleep, less stress, and overall well-being.  Medical   Heart Disease Risk Reduction Clinical staff conducted group or individual video education with verbal and written material and guidebook.  Patient learns our heart is our most vital organ as it circulates oxygen, nutrients, white blood cells, and hormones throughout the entire body, and carries waste away. Data supports a plant-based eating plan like the Pritikin Program for its effectiveness in slowing progression of and reversing heart disease. The video provides a number of recommendations to address heart disease.   Metabolic Syndrome and Belly Fat  Clinical  staff conducted group or individual video education with verbal and written material and guidebook.  Patient learns what metabolic syndrome is, how it leads to heart disease, and how one can reverse it and keep it from coming back. You have metabolic syndrome if you have 3 of the following 5 criteria: abdominal obesity, high blood pressure, high triglycerides, low HDL cholesterol, and high blood sugar.  Hypertension and Heart Disease Clinical staff conducted group or individual video education with verbal and written material and guidebook.  Patient learns that high blood pressure, or hypertension, is very common in the [place] . Hypertension is largely due to excessive salt intake, but other important risk factors include being overweight, physical inactivity, drinking too much alcohol, smoking, and not eating enough potassium from fruits and vegetables. High blood pressure is a leading risk factor for heart attack, stroke, congestive heart failure, dementia, kidney failure, and  premature death. Long-term effects of excessive salt intake include stiffening of the arteries and thickening of heart muscle and organ damage. Recommendations include ways to reduce hypertension and the risk of heart disease.  Diseases of Our Time - Focusing on Diabetes Clinical staff conducted group or individual video education with verbal and written material and guidebook.  Patient learns why the best way to stop diseases of our time is prevention, through food and other lifestyle changes. Medicine (such as prescription pills and surgeries) is often only a Band-Aid on the problem, not a long-term solution. Most common diseases of our time include obesity, type 2 diabetes, hypertension, heart disease, and cancer. The Pritikin Program is recommended and has been proven to help reduce, reverse, and/or prevent the damaging effects of metabolic syndrome.  Nutrition   Overview of the Pritikin Eating Plan  Clinical staff conducted group or individual video education with verbal and written material and guidebook.  Patient learns about the Pritikin Eating Plan for disease risk reduction. The Pritikin Eating Plan emphasizes a wide variety of unrefined, minimally-processed carbohydrates, like fruits, vegetables, whole grains, and legumes. Go, Caution, and Stop food choices are explained. Plant-based and lean animal proteins are emphasized. Rationale provided for low sodium intake for blood pressure control, low added sugars for blood sugar stabilization, and low added fats and oils for coronary artery disease risk reduction and weight management.  Calorie Density  Clinical staff conducted group or individual video education with verbal and written material and guidebook.  Patient learns about calorie density and how it impacts the Pritikin Eating Plan. Knowing the characteristics of the food you choose will help you decide whether those foods will lead to weight gain or weight loss, and whether you want to  consume more or less of them. Weight loss is usually a side effect of the Pritikin Eating Plan because of its focus on low calorie-dense foods.  Label Reading  Clinical staff conducted group or individual video education with verbal and written material and guidebook.  Patient learns about the Pritikin recommended label reading guidelines and corresponding recommendations regarding calorie density, added sugars, sodium content, and whole grains.  Dining Out - Part 1  Clinical staff conducted group or individual video education with verbal and written material and guidebook.  Patient learns that restaurant meals can be sabotaging because they can be so high in calories, fat, sodium, and/or sugar. Patient learns recommended strategies on how to positively address this and avoid unhealthy pitfalls.  Facts on Fats  Clinical staff conducted group or individual video education with verbal and written material and guidebook.  Patient learns that lifestyle modifications can be just as effective, if not more so, as many medications for lowering your risk of heart disease. A Pritikin lifestyle can help to reduce your risk of inflammation and atherosclerosis (cholesterol build-up, or plaque, in the artery walls). Lifestyle interventions such as dietary choices and physical activity address the cause of atherosclerosis. A review of the types of fats and their impact on blood cholesterol levels, along with dietary recommendations to reduce fat intake is also included.  Nutrition Action Plan  Clinical staff conducted group or individual video education with verbal and written material and guidebook.  Patient learns how to incorporate Pritikin recommendations into their lifestyle. Recommendations include planning and keeping personal health goals in mind as an important part of their success.  Healthy Mind-Set    Healthy Minds, Bodies, Hearts  Clinical staff conducted group or individual video education with  verbal and written material and guidebook.  Patient learns how to identify when they are stressed. Video will discuss the impact of that stress, as well as the many benefits of stress management. Patient will also be introduced to stress management techniques. The way we think, act, and feel has an impact on our hearts.  How Our Thoughts Can Heal Our Hearts  Clinical staff conducted group or individual video education with verbal and written material and guidebook.  Patient learns that negative thoughts can cause depression and anxiety. This can result in negative lifestyle behavior and serious health problems. Cognitive behavioral therapy is an effective method to help control our thoughts in order to change and improve our emotional outlook.  Additional Videos:  Exercise    Improving Performance  Clinical staff conducted group or individual video education with verbal and written material and guidebook.  Patient learns to use a non-linear approach by alternating intensity levels and lengths of time spent exercising to help burn more calories and lose more body fat. Cardiovascular exercise helps improve heart health, metabolism, hormonal balance, blood sugar control, and recovery from fatigue. Resistance training improves strength, endurance, balance, coordination, reaction time, metabolism, and muscle mass. Flexibility exercise improves circulation, posture, and balance. Seek guidance from your physician and exercise physiologist before implementing an exercise routine and learn your capabilities and proper form for all exercise.  Introduction to Yoga  Clinical staff conducted group or individual video education with verbal and written material and guidebook.  Patient learns about yoga, a discipline of the coming together of mind, breath, and body. The benefits of yoga include improved flexibility, improved range of motion, better posture and core strength, increased lung function, weight loss, and  positive self-image. Yoga's heart health benefits include lowered blood pressure, healthier heart rate, decreased cholesterol and triglyceride levels, improved immune function, and reduced stress. Seek guidance from your physician and exercise physiologist before implementing an exercise routine and learn your capabilities and proper form for all exercise.  Medical   Aging: Enhancing Your Quality of Life  Clinical staff conducted group or individual video education with verbal and written material and guidebook.  Patient learns key strategies and recommendations to stay in good physical health and enhance quality of life, such as prevention strategies, having an advocate, securing a Health Care Proxy and Power of Attorney, and keeping a list of medications and system for tracking them. It also discusses how to avoid risk for bone loss.  Biology of Weight Control  Clinical staff conducted group or individual video education with verbal and written material and guidebook.  Patient learns that  weight gain occurs because we consume more calories than we burn (eating more, moving less). Even if your body weight is normal, you may have higher ratios of fat compared to muscle mass. Too much body fat puts you at increased risk for cardiovascular disease, heart attack, stroke, type 2 diabetes, and obesity-related cancers. In addition to exercise, following the Pritikin Eating Plan can help reduce your risk.  Decoding Lab Results  Clinical staff conducted group or individual video education with verbal and written material and guidebook.  Patient learns that lab test reflects one measurement whose values change over time and are influenced by many factors, including medication, stress, sleep, exercise, food, hydration, pre-existing medical conditions, and more. It is recommended to use the knowledge from this video to become more involved with your lab results and evaluate your numbers to speak with your  doctor.   Diseases of Our Time - Overview  Clinical staff conducted group or individual video education with verbal and written material and guidebook.  Patient learns that according to the CDC, 50% to 70% of chronic diseases (such as obesity, type 2 diabetes, elevated lipids, hypertension, and heart disease) are avoidable through lifestyle improvements including healthier food choices, listening to satiety cues, and increased physical activity.  Sleep Disorders Clinical staff conducted group or individual video education with verbal and written material and guidebook.  Patient learns how good quality and duration of sleep are important to overall health and well-being. Patient also learns about sleep disorders and how they impact health along with recommendations to address them, including discussing with a physician.  Nutrition  Dining Out - Part 2 Clinical staff conducted group or individual video education with verbal and written material and guidebook.  Patient learns how to plan ahead and communicate in order to maximize their dining experience in a healthy and nutritious manner. Included are recommended food choices based on the type of restaurant the patient is visiting.   Fueling a Banker conducted group or individual video education with verbal and written material and guidebook.  There is a strong connection between our food choices and our health. Diseases like obesity and type 2 diabetes are very prevalent and are in large-part due to lifestyle choices. The Pritikin Eating Plan provides plenty of food and hunger-curbing satisfaction. It is easy to follow, affordable, and helps reduce health risks.  Menu Workshop  Clinical staff conducted group or individual video education with verbal and written material and guidebook.  Patient learns that restaurant meals can sabotage health goals because they are often packed with calories, fat, sodium, and sugar.  Recommendations include strategies to plan ahead and to communicate with the manager, chef, or server to help order a healthier meal.  Planning Your Eating Strategy  Clinical staff conducted group or individual video education with verbal and written material and guidebook.  Patient learns about the Pritikin Eating Plan and its benefit of reducing the risk of disease. The Pritikin Eating Plan does not focus on calories. Instead, it emphasizes high-quality, nutrient-rich foods. By knowing the characteristics of the foods, we choose, we can determine their calorie density and make informed decisions.  Targeting Your Nutrition Priorities  Clinical staff conducted group or individual video education with verbal and written material and guidebook.  Patient learns that lifestyle habits have a tremendous impact on disease risk and progression. This video provides eating and physical activity recommendations based on your personal health goals, such as reducing LDL cholesterol, losing weight, preventing or  controlling type 2 diabetes, and reducing high blood pressure.  Vitamins and Minerals  Clinical staff conducted group or individual video education with verbal and written material and guidebook.  Patient learns different ways to obtain key vitamins and minerals, including through a recommended healthy diet. It is important to discuss all supplements you take with your doctor.   Healthy Mind-Set    Smoking Cessation  Clinical staff conducted group or individual video education with verbal and written material and guidebook.  Patient learns that cigarette smoking and tobacco addiction pose a serious health risk which affects millions of people. Stopping smoking will significantly reduce the risk of heart disease, lung disease, and many forms of cancer. Recommended strategies for quitting are covered, including working with your doctor to develop a successful plan.  Culinary   Becoming a Corporate investment banker conducted group or individual video education with verbal and written material and guidebook.  Patient learns that cooking at home can be healthy, cost-effective, quick, and puts them in control. Keys to cooking healthy recipes will include looking at your recipe, assessing your equipment needs, planning ahead, making it simple, choosing cost-effective seasonal ingredients, and limiting the use of added fats, salts, and sugars.  Cooking - Breakfast and Snacks  Clinical staff conducted group or individual video education with verbal and written material and guidebook.  Patient learns how important breakfast is to satiety and nutrition through the entire day. Recommendations include key foods to eat during breakfast to help stabilize blood sugar levels and to prevent overeating at meals later in the day. Planning ahead is also a key component.  Cooking - Educational psychologist conducted group or individual video education with verbal and written material and guidebook.  Patient learns eating strategies to improve overall health, including an approach to cook more at home. Recommendations include thinking of animal protein as a side on your plate rather than center stage and focusing instead on lower calorie dense options like vegetables, fruits, whole grains, and plant-based proteins, such as beans. Making sauces in large quantities to freeze for later and leaving the skin on your vegetables are also recommended to maximize your experience.  Cooking - Healthy Salads and Dressing Clinical staff conducted group or individual video education with verbal and written material and guidebook.  Patient learns that vegetables, fruits, whole grains, and legumes are the foundations of the Pritikin Eating Plan. Recommendations include how to incorporate each of these in flavorful and healthy salads, and how to create homemade salad dressings. Proper handling of ingredients is also covered.  Cooking - Soups and [employer] - Soups and Desserts Clinical staff conducted group or individual video education with verbal and written material and guidebook.  Patient learns that Pritikin soups and desserts make for easy, nutritious, and delicious snacks and meal components that are low in sodium, fat, sugar, and calorie density, while high in vitamins, minerals, and filling fiber. Recommendations include simple and healthy ideas for soups and desserts.   Overview     The Pritikin Solution Program Overview Clinical staff conducted group or individual video education with verbal and written material and guidebook.  Patient learns that the results of the Pritikin Program have been documented in more than 100 articles published in peer-reviewed journals, and the benefits include reducing risk factors for (and, in some cases, even reversing) high cholesterol, high blood pressure, type 2 diabetes, obesity, and more! An overview of the three key pillars of the Pritikin  Program will be covered: eating well, doing regular exercise, and having a healthy mind-set.  WORKSHOPS  Exercise: Exercise Basics: Building Your Action Plan Clinical staff led group instruction and group discussion with PowerPoint presentation and patient guidebook. To enhance the learning environment the use of posters, models and videos may be added. At the conclusion of this workshop, patients will comprehend the difference between physical activity and exercise, as well as the benefits of incorporating both, into their routine. Patients will understand the FITT (Frequency, Intensity, Time, and Type) principle and how to use it to build an exercise action plan. In addition, safety concerns and other considerations for exercise and cardiac rehab will be addressed by the presenter. The purpose of this lesson is to promote a comprehensive and effective weekly exercise routine in order to improve patients' overall level of  fitness.   Managing Heart Disease: Your Path to a Healthier Heart Clinical staff led group instruction and group discussion with PowerPoint presentation and patient guidebook. To enhance the learning environment the use of posters, models and videos may be added.At the conclusion of this workshop, patients will understand the anatomy and physiology of the heart. Additionally, they will understand how Pritikin's three pillars impact the risk factors, the progression, and the management of heart disease.  The purpose of this lesson is to provide a high-level overview of the heart, heart disease, and how the Pritikin lifestyle positively impacts risk factors.  Exercise Biomechanics Clinical staff led group instruction and group discussion with PowerPoint presentation and patient guidebook. To enhance the learning environment the use of posters, models and videos may be added. Patients will learn how the structural parts of their bodies function and how these functions impact their daily activities, movement, and exercise. Patients will learn how to promote a neutral spine, learn how to manage pain, and identify ways to improve their physical movement in order to promote healthy living. The purpose of this lesson is to expose patients to common physical limitations that impact physical activity. Participants will learn practical ways to adapt and manage aches and pains, and to minimize their effect on regular exercise. Patients will learn how to maintain good posture while sitting, walking, and lifting.  Balance Training and Fall Prevention  Clinical staff led group instruction and group discussion with PowerPoint presentation and patient guidebook. To enhance the learning environment the use of posters, models and videos may be added. At the conclusion of this workshop, patients will understand the importance of their sensorimotor skills (vision, proprioception, and the vestibular system)  in maintaining their ability to balance as they age. Patients will apply a variety of balancing exercises that are appropriate for their current level of function. Patients will understand the common causes for poor balance, possible solutions to these problems, and ways to modify their physical environment in order to minimize their fall risk. The purpose of this lesson is to teach patients about the importance of maintaining balance as they age and ways to minimize their risk of falling.  WORKSHOPS   Nutrition:  Fueling a Ship broker led group instruction and group discussion with PowerPoint presentation and patient guidebook. To enhance the learning environment the use of posters, models and videos may be added. Patients will review the foundational principles of the Pritikin Eating Plan and understand what constitutes a serving size in each of the food groups. Patients will also learn Pritikin-friendly foods that are better choices when away from home and review make-ahead meal and snack options.  Calorie density will be reviewed and applied to three nutrition priorities: weight maintenance, weight loss, and weight gain. The purpose of this lesson is to reinforce (in a group setting) the key concepts around what patients are recommended to eat and how to apply these guidelines when away from home by planning and selecting Pritikin-friendly options. Patients will understand how calorie density may be adjusted for different weight management goals.  Mindful Eating  Clinical staff led group instruction and group discussion with PowerPoint presentation and patient guidebook. To enhance the learning environment the use of posters, models and videos may be added. Patients will briefly review the concepts of the Pritikin Eating Plan and the importance of low-calorie dense foods. The concept of mindful eating will be introduced as well as the importance of paying attention to internal hunger  signals. Triggers for non-hunger eating and techniques for dealing with triggers will be explored. The purpose of this lesson is to provide patients with the opportunity to review the basic principles of the Pritikin Eating Plan, discuss the value of eating mindfully and how to measure internal cues of hunger and fullness using the Hunger Scale. Patients will also discuss reasons for non-hunger eating and learn strategies to use for controlling emotional eating.  Targeting Your Nutrition Priorities Clinical staff led group instruction and group discussion with PowerPoint presentation and patient guidebook. To enhance the learning environment the use of posters, models and videos may be added. Patients will learn how to determine their genetic susceptibility to disease by reviewing their family history. Patients will gain insight into the importance of diet as part of an overall healthy lifestyle in mitigating the impact of genetics and other environmental insults. The purpose of this lesson is to provide patients with the opportunity to assess their personal nutrition priorities by looking at their family history, their own health history and current risk factors. Patients will also be able to discuss ways of prioritizing and modifying the Pritikin Eating Plan for their highest risk areas  Menu  Clinical staff led group instruction and group discussion with PowerPoint presentation and patient guidebook. To enhance the learning environment the use of posters, models and videos may be added. Using menus brought in from [employer], or printed from [employer], patients will apply the Pritikin dining out guidelines that were presented in the [employer] video. Patients will also be able to practice these guidelines in a variety of provided scenarios. The purpose of this lesson is to provide patients with the opportunity to practice hands-on learning of the Pritikin Dining Out guidelines  with actual menus and practice scenarios.  Label Reading Clinical staff led group instruction and group discussion with PowerPoint presentation and patient guidebook. To enhance the learning environment the use of posters, models and videos may be added. Patients will review and discuss the Pritikin label reading guidelines presented in Pritikin's Label Reading Educational series video. Using fool labels brought in from local grocery stores and markets, patients will apply the label reading guidelines and determine if the packaged food meet the Pritikin guidelines. The purpose of this lesson is to provide patients with the opportunity to review, discuss, and practice hands-on learning of the Pritikin Label Reading guidelines with actual packaged food labels. Cooking School  Pritikin's [employer] are designed to teach patients ways to prepare quick, simple, and affordable recipes at home. The importance of nutrition's role in chronic disease risk reduction is reflected in its emphasis in the overall Pritikin program. By  learning how to prepare essential core Pritikin Eating Plan recipes, patients will increase control over what they eat; be able to customize the flavor of foods without the use of added salt, sugar, or fat; and improve the quality of the food they consume. By learning a set of core recipes which are easily assembled, quickly prepared, and affordable, patients are more likely to prepare more healthy foods at home. These workshops focus on convenient breakfasts, simple entres, side dishes, and desserts which can be prepared with minimal effort and are consistent with nutrition recommendations for cardiovascular risk reduction. Cooking [employer] are taught by a Armed forces logistics/support/administrative officer (RD) who has been trained by the [employer]. The chef or RD has a clear understanding of the importance of minimizing - if not completely eliminating - added fat, sugar, and  sodium in recipes. Throughout the series of Cooking School Workshop sessions, patients will learn about healthy ingredients and efficient methods of cooking to build confidence in their capability to prepare    Cooking School weekly topics:  Adding Flavor- Sodium-Free  Fast and Healthy Breakfasts  Powerhouse Plant-Based Proteins  Satisfying Salads and Dressings  Simple Sides and Sauces  International Cuisine-Spotlight on the [employer] Zones  Delicious Desserts  Savory Soups  [employer] - Meals in a Astronomer Appetizers and Snacks  Comforting Weekend Breakfasts  One-Pot Wonders   Fast Evening Meals  Landscape architect Your Pritikin Plate  WORKSHOPS   Healthy Mindset (Psychosocial):  Focused Goals, Sustainable Changes Clinical staff led group instruction and group discussion with PowerPoint presentation and patient guidebook. To enhance the learning environment the use of posters, models and videos may be added. Patients will be able to apply effective goal setting strategies to establish at least one personal goal, and then take consistent, meaningful action toward that goal. They will learn to identify common barriers to achieving personal goals and develop strategies to overcome them. Patients will also gain an understanding of how our mind-set can impact our ability to achieve goals and the importance of cultivating a positive and growth-oriented mind-set. The purpose of this lesson is to provide patients with a deeper understanding of how to set and achieve personal goals, as well as the tools and strategies needed to overcome common obstacles which may arise along the way.  From Head to Heart: The Power of a Healthy Outlook  Clinical staff led group instruction and group discussion with PowerPoint presentation and patient guidebook. To enhance the learning environment the use of posters, models and videos may be added. Patients will be able to recognize and  describe the impact of emotions and mood on physical health. They will discover the importance of self-care and explore self-care practices which may work for them. Patients will also learn how to utilize the 4 C's to cultivate a healthier outlook and better manage stress and challenges. The purpose of this lesson is to demonstrate to patients how a healthy outlook is an essential part of maintaining good health, especially as they continue their cardiac rehab journey.  Healthy Sleep for a Healthy Heart Clinical staff led group instruction and group discussion with PowerPoint presentation and patient guidebook. To enhance the learning environment the use of posters, models and videos may be added. At the conclusion of this workshop, patients will be able to demonstrate knowledge of the importance of sleep to overall health, well-being, and quality of life. They will understand the symptoms of, and treatments for, common sleep  disorders. Patients will also be able to identify daytime and nighttime behaviors which impact sleep, and they will be able to apply these tools to help manage sleep-related challenges. The purpose of this lesson is to provide patients with a general overview of sleep and outline the importance of quality sleep. Patients will learn about a few of the most common sleep disorders. Patients will also be introduced to the concept of "sleep hygiene," and discover ways to self-manage certain sleeping problems through simple daily behavior changes. Finally, the workshop will motivate patients by clarifying the links between quality sleep and their goals of heart-healthy living.   Recognizing and Reducing Stress Clinical staff led group instruction and group discussion with PowerPoint presentation and patient guidebook. To enhance the learning environment the use of posters, models and videos may be added. At the conclusion of this workshop, patients will be able to understand the types of stress  reactions, differentiate between acute and chronic stress, and recognize the impact that chronic stress has on their health. They will also be able to apply different coping mechanisms, such as reframing negative self-talk. Patients will have the opportunity to practice a variety of stress management techniques, such as deep abdominal breathing, progressive muscle relaxation, and/or guided imagery.  The purpose of this lesson is to educate patients on the role of stress in their lives and to provide healthy techniques for coping with it.  Learning Barriers/Preferences:  Learning Barriers/Preferences - 05/22/24 1336       Learning Barriers/Preferences   Learning Barriers Hearing    Learning Preferences Computer/Internet;Written Material          Education Topics:  Knowledge Questionnaire Score:  Knowledge Questionnaire Score - 05/22/24 1556       Knowledge Questionnaire Score   Pre Score 17/24          Core Components/Risk Factors/Patient Goals at Admission:  Personal Goals and Risk Factors at Admission - 05/22/24 1432       Core Components/Risk Factors/Patient Goals on Admission   Heart Failure Yes    Intervention Provide a combined exercise and nutrition program that is supplemented with education, support and counseling about heart failure. Directed toward relieving symptoms such as shortness of breath, decreased exercise tolerance, and extremity edema.    Expected Outcomes Improve functional capacity of life;Short term: Attendance in program 2-3 days a week with increased exercise capacity. Reported lower sodium intake. Reported increased fruit and vegetable intake. Reports medication compliance.;Short term: Daily weights obtained and reported for increase. Utilizing diuretic protocols set by physician.;Long term: Adoption of self-care skills and reduction of barriers for early signs and symptoms recognition and intervention leading to self-care maintenance.    Lipids Yes     Intervention Provide education and support for participant on nutrition & aerobic/resistive exercise along with prescribed medications to achieve LDL 70mg , HDL >40mg .    Expected Outcomes Short Term: Participant states understanding of desired cholesterol values and is compliant with medications prescribed. Participant is following exercise prescription and nutrition guidelines.;Long Term: Cholesterol controlled with medications as prescribed, with individualized exercise RX and with personalized nutrition plan. Value goals: LDL < 70mg , HDL > 40 mg.          Core Components/Risk Factors/Patient Goals Review:    Core Components/Risk Factors/Patient Goals at Discharge (Final Review):    ITP Comments:  ITP Comments     Row Name 05/22/24 1319           ITP Comments Medical Director- Dr. Wilbert Bihari, MD.  Introduction to the [facility]in Education/ [facility]. Reviewed initial orientation folder with patient.          Comments: [name] attended orientation for the cardiac rehabilitation program on  05/22/2024  to perform initial intake and exercise walk test. She was introduced to the [employer] education and orientation packet was reviewed. Completed 6-minute walk test, measurements, initial ITP, and exercise prescription. Vital signs: blood pressure was low at rest and with exercise, she was asymptomatic. Telemetry-sinus arrhythmia with rare PVCs, asymptomatic.  Service time was from 1319 to 1510. Arnoldo CHRISTELLA Gal, MS, ACSM CEP 05/22/2024 [phone]

## 2024-05-22 NOTE — Progress Notes (Signed)
 Cardiac Rehab Medication Review   Does the patient  feel that his/her medications are working for him/her?  yes  Has the patient been experiencing any side effects to the medications prescribed?  no  Does the patient measure his/her own blood pressure or blood glucose at home?  Checks blood pressure occasionally.   Does the patient have any problems obtaining medications due to transportation or finances?   no  Understanding of regimen: excellent Understanding of indications: excellent Potential of compliance: excellent    Comments: Cale has a pulse oximeter to check her pulse. She has a prn medication for restless leg syndrome and a medication for acid reflux that she will bring name and dosage next session.    Arnoldo Gal 05/22/2024 2:23 PM

## 2024-05-25 DIAGNOSIS — I2510 Atherosclerotic heart disease of native coronary artery without angina pectoris: Secondary | ICD-10-CM | POA: Diagnosis not present

## 2024-05-25 DIAGNOSIS — I4891 Unspecified atrial fibrillation: Secondary | ICD-10-CM | POA: Diagnosis not present

## 2024-05-25 DIAGNOSIS — Z48812 Encounter for surgical aftercare following surgery on the circulatory system: Secondary | ICD-10-CM | POA: Diagnosis not present

## 2024-05-25 DIAGNOSIS — I10 Essential (primary) hypertension: Secondary | ICD-10-CM | POA: Diagnosis not present

## 2024-05-25 DIAGNOSIS — F419 Anxiety disorder, unspecified: Secondary | ICD-10-CM | POA: Diagnosis not present

## 2024-05-25 DIAGNOSIS — F331 Major depressive disorder, recurrent, moderate: Secondary | ICD-10-CM | POA: Diagnosis not present

## 2024-05-25 DIAGNOSIS — G4700 Insomnia, unspecified: Secondary | ICD-10-CM | POA: Diagnosis not present

## 2024-05-25 DIAGNOSIS — I272 Pulmonary hypertension, unspecified: Secondary | ICD-10-CM | POA: Diagnosis not present

## 2024-05-25 DIAGNOSIS — L89152 Pressure ulcer of sacral region, stage 2: Secondary | ICD-10-CM | POA: Diagnosis not present

## 2024-05-28 ENCOUNTER — Encounter (HOSPITAL_COMMUNITY)
Admission: RE | Admit: 2024-05-28 | Discharge: 2024-05-28 | Disposition: A | Discharge status: Home / Self Care | Source: Ambulatory Visit | Attending: Cardiology | Admitting: Cardiology

## 2024-05-28 ENCOUNTER — Ambulatory Visit: Attending: Physician Assistant | Admitting: Physician Assistant

## 2024-05-28 VITALS — BP 110/60 | HR 75 | Ht 64.0 in | Wt 133.0 lb

## 2024-05-28 DIAGNOSIS — Z9889 Other specified postprocedural states: Secondary | ICD-10-CM | POA: Diagnosis not present

## 2024-05-28 DIAGNOSIS — I5022 Chronic systolic (congestive) heart failure: Secondary | ICD-10-CM

## 2024-05-28 DIAGNOSIS — D6869 Other thrombophilia: Secondary | ICD-10-CM | POA: Diagnosis not present

## 2024-05-28 DIAGNOSIS — I480 Paroxysmal atrial fibrillation: Secondary | ICD-10-CM

## 2024-05-28 DIAGNOSIS — I872 Venous insufficiency (chronic) (peripheral): Secondary | ICD-10-CM | POA: Diagnosis not present

## 2024-05-28 NOTE — Progress Notes (Signed)
 Daily Session Note  Patient Details  Name: [name] MRN: [id number] Date of Birth: 1941-04-14 Referring Provider:   Flowsheet Row INTENSIVE CARDIAC REHAB ORIENT from 05/22/2024 in [facility]  Referring Provider Dawn Heinz, MD    Encounter Date: 05/28/2024  Check In:  Session Check In - 05/28/24 1559       Check-In   Supervising physician immediately available to respond to emergencies [facility] MD immediately available    Physician(s) Barnie Hila, NP    Location [facility]    Staff Present Alm Parkins, MS, ACSM-CEP, CCRP, Exercise Physiologist;Other;Marzell Allemand Cyrus, RN, BSN;Jetta Walker BS, ACSM-CEP, Exercise Physiologist    Virtual Visit No    Medication changes reported     No    Fall or balance concerns reported    No    Tobacco Cessation No Change    Warm-up and Cool-down Performed as group-led instruction   Cardiac Rehab Orientation   Resistance Training Performed Yes    VAD Patient? No    PAD/SET Patient? No      Pain Assessment   Currently in Pain? No/denies    Pain Score 0-No pain    Multiple Pain Sites No          Capillary Blood Glucose: No results found for this or any previous visit (from the past 24 hours).   Exercise Prescription Changes - 05/28/24 1651       Response to Exercise   Blood Pressure (Admit) 96/68    Blood Pressure (Exercise) 100/62    Blood Pressure (Exit) 114/62    Heart Rate (Admit) 77 bpm    Heart Rate (Exercise) 85 bpm    Heart Rate (Exit) 81 bpm    Rating of Perceived Exertion (Exercise) 13    Perceived Dyspnea (Exercise) 0    Symptoms Mild lightheaded pre exercise. Seated wts/stretches    Comments Pt first day in the [facility] ICR prgram    Duration Progress to 30 minutes of  aerobic without signs/symptoms of physical distress    Intensity THRR unchanged      Progression   Progression Continue to progress workloads to maintain intensity  without signs/symptoms of physical distress.    Average METs 1.4      Resistance Training   Training Prescription Yes    Weight 2 lbs    Reps 10-15    Time 10 Minutes      NuStep   Level 1    SPM 47    Minutes 30    METs 1.4          Social History   Tobacco Use  Smoking Status Never  Smokeless Tobacco Never    Goals Met:  No report of concerns or symptoms today Strength training completed today  Goals Unmet:  Not Applicable  Comments: Pt started cardiac rehab today. [name] is somewhat deconditioned reported feeling lightheaded initially. Given water with an improvement in symptoms. [name] was able to complete exercise without further complaints. [name] uses a cane for stability. VSS, telemetry-Sinus rhythm, Medication list reconciled. Pt denies barriers to medicaiton compliance.  PSYCHOSOCIAL ASSESSMENT:  PHQ-4. Pt exhibits positive coping skills, hopeful outlook with supportive family. No psychosocial needs identified at this time, no psychosocial interventions necessary.    Pt enjoys reading.   Pt oriented to exercise equipment and routine.    Understanding verbalized. [name] Elpidio Cyrus RN BSN    Dr. Wilbert Bihari is Medical Director for  Cardiac Rehab at [facility].

## 2024-05-28 NOTE — Patient Instructions (Signed)
 Medication Instructions:  Your physician recommends that you continue on your current medications as directed. Please refer to the Current Medication list given to you today.  *If you need a refill on your cardiac medications before your next appointment, please call your pharmacy*  Lab Work: NONE If you have labs (blood work) drawn today and your tests are completely normal, you will receive your results only by: MyChart Message (if you have MyChart) OR A paper copy in the mail If you have any lab test that is abnormal or we need to change your treatment, we will call you to review the results.  Testing/Procedures: NONE  Follow-Up: At [facility], you and your health needs are our priority.  As part of our continuing mission to provide you with exceptional heart care, our providers are all part of one team.  This team includes your primary Cardiologist (physician) and Advanced Practice Providers or APPs (Physician Assistants and Nurse Practitioners) who all work together to provide you with the care you need, when you need it.  Your next appointment:   NOV/DEC 2025  Provider:   You will follow up in the Atrial Fibrillation Clinic located at [facility]. Your provider will be: Fairy Heinrich, PA-C   We recommend signing up for the patient portal called MyChart.  Sign up information is provided on this After Visit Summary.  MyChart is used to connect with patients for Virtual Visits (Telemedicine).  Patients are able to view lab/test results, encounter notes, upcoming appointments, etc.  Non-urgent messages can be sent to your provider as well.   To learn more about what you can do with MyChart, go to [email or web address].

## 2024-05-29 ENCOUNTER — Encounter: Payer: Self-pay | Admitting: *Deleted

## 2024-05-29 ENCOUNTER — Other Ambulatory Visit: Payer: Self-pay | Admitting: *Deleted

## 2024-05-29 ENCOUNTER — Inpatient Hospital Stay: Attending: Hematology and Oncology

## 2024-05-29 ENCOUNTER — Encounter: Payer: Self-pay | Admitting: Cardiology

## 2024-05-29 DIAGNOSIS — I5023 Acute on chronic systolic (congestive) heart failure: Secondary | ICD-10-CM | POA: Diagnosis not present

## 2024-05-29 DIAGNOSIS — D509 Iron deficiency anemia, unspecified: Secondary | ICD-10-CM | POA: Insufficient documentation

## 2024-05-29 LAB — CBC WITH DIFFERENTIAL (CANCER CENTER ONLY)
Abs Immature Granulocytes: 0.02 K/uL (ref 0.00–0.07)
Basophils Absolute: 0 K/uL (ref 0.0–0.1)
Basophils Relative: 1 %
Eosinophils Absolute: 0 K/uL (ref 0.0–0.5)
Eosinophils Relative: 1 %
HCT: 25.4 % — ABNORMAL LOW (ref 36.0–46.0)
Hemoglobin: 8.3 g/dL — ABNORMAL LOW (ref 12.0–15.0)
Immature Granulocytes: 0 %
Lymphocytes Relative: 14 %
Lymphs Abs: 0.8 K/uL (ref 0.7–4.0)
MCH: 26.7 pg (ref 26.0–34.0)
MCHC: 32.7 g/dL (ref 30.0–36.0)
MCV: 81.7 fL (ref 80.0–100.0)
Monocytes Absolute: 1.1 K/uL — ABNORMAL HIGH (ref 0.1–1.0)
Monocytes Relative: 17 %
Neutro Abs: 4.2 K/uL (ref 1.7–7.7)
Neutrophils Relative %: 67 %
Platelet Count: 206 K/uL (ref 150–400)
RBC: 3.11 MIL/uL — ABNORMAL LOW (ref 3.87–5.11)
RDW: 15.1 % (ref 11.5–15.5)
WBC Count: 6.1 K/uL (ref 4.0–10.5)
nRBC: 0 % (ref 0.0–0.2)

## 2024-05-29 LAB — IRON AND IRON BINDING CAPACITY (CC-WL,HP ONLY)
Iron: 21 ug/dL — ABNORMAL LOW (ref 28–170)
Saturation Ratios: 5 % — ABNORMAL LOW (ref 10.4–31.8)
TIBC: 420 ug/dL (ref 250–450)
UIBC: 399 ug/dL (ref 148–442)

## 2024-05-29 LAB — FERRITIN: Ferritin: 36 ng/mL (ref 11–307)

## 2024-05-30 ENCOUNTER — Encounter (HOSPITAL_COMMUNITY)
Admission: RE | Admit: 2024-05-30 | Discharge: 2024-05-30 | Disposition: A | Discharge status: Home / Self Care | Source: Ambulatory Visit | Attending: Cardiology

## 2024-05-30 ENCOUNTER — Ambulatory Visit (HOSPITAL_COMMUNITY)
Admission: RE | Admit: 2024-05-30 | Discharge: 2024-05-30 | Disposition: A | Discharge status: Home / Self Care | Source: Ambulatory Visit | Attending: Vascular Surgery | Admitting: Vascular Surgery

## 2024-05-30 DIAGNOSIS — I872 Venous insufficiency (chronic) (peripheral): Secondary | ICD-10-CM | POA: Diagnosis not present

## 2024-05-30 DIAGNOSIS — Z9889 Other specified postprocedural states: Secondary | ICD-10-CM | POA: Diagnosis not present

## 2024-05-31 ENCOUNTER — Inpatient Hospital Stay: Admitting: Hematology and Oncology

## 2024-05-31 ENCOUNTER — Other Ambulatory Visit: Payer: Self-pay | Admitting: Hematology and Oncology

## 2024-05-31 DIAGNOSIS — D509 Iron deficiency anemia, unspecified: Secondary | ICD-10-CM

## 2024-05-31 NOTE — Assessment & Plan Note (Signed)
 Diagnosed in 2018 and underwent upper endoscopy and colonoscopy by Dr. 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   Lab review 05/14/2019: Ferritin 13, iron  saturation 19%, hemoglobin 12.3 11/16/2019: Ferritin 27, iron  saturation 16%, hemoglobin 12.6 11/06/2021: Hemoglobin 10.5, MCV 77.6, TIBC 442, iron  saturation 4% 03/25/2022: Hemoglobin 12.8, MCV 90, TIBC 466, iron  saturation 20%, ferritin 16 11/12/2022: Hemoglobin 10.7, MCV 79.1, platelets 180, ferritin 9.9, creatinine 1.01 03/29/2023: Hemoglobin 11.8, MCV 89.1, TIBC 490, iron  saturation 11%, ferritin 8 05/31/2023: Hemoglobin 10.4, MCV 83.9, iron  saturation 5%, TIBC 511, ferritin 7 12/05/2023: Hemoglobin 11.1, MCV 86, iron  saturation 7%, TIBC 487, ferritin 7 05/29/2024: Hemoglobin 8.3, MCV 81.7, iron  saturation 5%, ferritin 36  Recommendation: IV iron  therapy Follow-up in 3 months with labs

## 2024-05-31 NOTE — Progress Notes (Signed)
 HEMATOLOGY-ONCOLOGY TELEPHONE VISIT PROGRESS NOTE  I connected with our patient on 05/31/24 at 10:45 AM EDT by telephone and verified that I am speaking with the correct person using two identifiers.  I discussed the limitations, risks, security and privacy concerns of performing an evaluation and management service by telephone and the availability of in person appointments.  I also discussed with the patient that there may be a patient responsible charge related to this service. The patient expressed understanding and agreed to proceed.   History of Present Illness: Follow-up of iron  deficiency anemia  History of Present Illness [name] is an 83 year old female who presents for follow-up regarding anemia management.  She underwent open heart surgery on February 08, 2024, and has since experienced anemia. Her hemoglobin level was 11.1 g/dL in February 2025, and she received iron  supplementation. Her hemoglobin has decreased to 8.3 g/dL in a recent lab report. She has received two doses of Feraheme  at the [facility] for her anemia.      REVIEW OF SYSTEMS:   Constitutional: Denies fevers, chills or abnormal weight loss All other systems were reviewed with the patient and are negative. Observations/Objective:     Assessment Plan:  Iron  deficiency anemia Diagnosed in 2018 and underwent upper endoscopy and colonoscopy by Dr. 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  02/08/24: Valve surgery   Lab review 05/14/2019: Ferritin 13, iron  saturation 19%, hemoglobin 12.3 11/16/2019: Ferritin 27, iron  saturation 16%, hemoglobin 12.6 11/06/2021: Hemoglobin 10.5, MCV 77.6, TIBC 442, iron  saturation 4% 03/25/2022: Hemoglobin 12.8, MCV 90, TIBC 466, iron  saturation 20%, ferritin 16 11/12/2022:  Hemoglobin 10.7, MCV 79.1, platelets 180, ferritin 9.9, creatinine 1.01 03/29/2023: Hemoglobin 11.8, MCV 89.1, TIBC 490, iron  saturation 11%, ferritin 8 05/31/2023: Hemoglobin 10.4, MCV 83.9, iron  saturation 5%, TIBC 511, ferritin 7 12/05/2023: Hemoglobin 11.1, MCV 86, iron  saturation 7%, TIBC 487, ferritin 7 05/29/2024: Hemoglobin 8.3, MCV 81.7, iron  saturation 5%, ferritin 36  Recommendation: IV iron  therapy Follow-up in 6 months with labs     I discussed the assessment and treatment plan with the patient. The patient was provided an opportunity to ask questions and all were answered. The patient agreed with the plan and demonstrated an understanding of the instructions. The patient was advised to call back or seek an in-person evaluation if the symptoms worsen or if the condition fails to improve as anticipated.   I provided 20 minutes of non-face-to-face time during this encounter.  This includes time for charting and coordination of care   Naomi MARLA Chad, MD

## 2024-06-01 ENCOUNTER — Telehealth: Payer: Self-pay

## 2024-06-01 ENCOUNTER — Encounter (HOSPITAL_COMMUNITY)
Admission: RE | Admit: 2024-06-01 | Discharge: 2024-06-01 | Disposition: A | Discharge status: Home / Self Care | Source: Ambulatory Visit | Attending: Cardiology | Admitting: Cardiology

## 2024-06-01 DIAGNOSIS — Z48812 Encounter for surgical aftercare following surgery on the circulatory system: Secondary | ICD-10-CM | POA: Insufficient documentation

## 2024-06-01 DIAGNOSIS — Z9889 Other specified postprocedural states: Secondary | ICD-10-CM | POA: Diagnosis not present

## 2024-06-01 NOTE — Telephone Encounter (Signed)
 Dr. Gudena, patient will be scheduled as soon as possible.  Auth Submission: NO AUTH NEEDED Site of care: Site of care: [facility] Payer: [employer] medicare Medication & CPT/J Code(s) submitted: Venofer  (Iron  Sucrose) J1756 Diagnosis Code:  Route of submission (phone, fax, portal):  Phone # Fax # Auth type: Buy/Bill PB Units/visits requested: 200mg  x 5 doses Reference number:  Approval from: 06/01/24 to 10/01/24

## 2024-06-04 ENCOUNTER — Encounter (HOSPITAL_COMMUNITY)
Admission: RE | Admit: 2024-06-04 | Discharge: 2024-06-04 | Disposition: A | Discharge status: Home / Self Care | Source: Ambulatory Visit | Attending: Cardiology | Admitting: Cardiology

## 2024-06-04 DIAGNOSIS — Z48812 Encounter for surgical aftercare following surgery on the circulatory system: Secondary | ICD-10-CM | POA: Diagnosis not present

## 2024-06-04 DIAGNOSIS — Z9889 Other specified postprocedural states: Secondary | ICD-10-CM

## 2024-06-05 ENCOUNTER — Ambulatory Visit (INDEPENDENT_AMBULATORY_CARE_PROVIDER_SITE_OTHER)

## 2024-06-05 ENCOUNTER — Telehealth: Payer: Self-pay | Admitting: Cardiology

## 2024-06-05 ENCOUNTER — Encounter (HOSPITAL_BASED_OUTPATIENT_CLINIC_OR_DEPARTMENT_OTHER): Admitting: General Surgery

## 2024-06-05 VITALS — BP 105/60 | HR 72 | Temp 98.4°F | Resp 24 | Ht 64.0 in | Wt 138.4 lb

## 2024-06-05 DIAGNOSIS — D509 Iron deficiency anemia, unspecified: Secondary | ICD-10-CM | POA: Diagnosis not present

## 2024-06-05 DIAGNOSIS — Z0279 Encounter for issue of other medical certificate: Secondary | ICD-10-CM

## 2024-06-05 MED ORDER — SODIUM CHLORIDE 0.9 % IV BOLUS
250.0000 mL | Freq: Once | INTRAVENOUS | Status: AC
  Administered 2024-06-05: 250 mL via INTRAVENOUS
  Filled 2024-06-05: qty 250

## 2024-06-05 MED ORDER — IRON SUCROSE 20 MG/ML IV SOLN
200.0000 mg | Freq: Once | INTRAVENOUS | Status: AC
Start: 2024-06-05 — End: 2024-06-05
  Administered 2024-06-05: 200 mg via INTRAVENOUS
  Filled 2024-06-05: qty 10

## 2024-06-05 NOTE — Progress Notes (Signed)
 Diagnosis: Iron  Deficiency Anemia  Provider:  Mannam, Praveen MD  Procedure: IV Push  IV Type: Peripheral, IV Location: L Antecubital  Venofer  (Iron  Sucrose), Dose: 200 mg  IV push administered with NS running for patient comfort.  Post Infusion IV Care: Observation period completed and Peripheral IV Discontinued  Discharge: Condition: Stable, Destination: Home . AVS Declined  Performed by:  Rocky FORBES Sar, RN

## 2024-06-05 NOTE — Telephone Encounter (Signed)
 Received Attending Physician's Statement from [employer].  Patient came in and signed release of information and paid the $29 form fee.   Form in Dr. Godfrey box.

## 2024-06-06 ENCOUNTER — Encounter (HOSPITAL_COMMUNITY)
Admission: RE | Admit: 2024-06-06 | Discharge: 2024-06-06 | Disposition: A | Discharge status: Home / Self Care | Source: Ambulatory Visit | Attending: Cardiology

## 2024-06-06 DIAGNOSIS — Z9889 Other specified postprocedural states: Secondary | ICD-10-CM | POA: Diagnosis not present

## 2024-06-06 DIAGNOSIS — Z48812 Encounter for surgical aftercare following surgery on the circulatory system: Secondary | ICD-10-CM | POA: Diagnosis not present

## 2024-06-07 ENCOUNTER — Ambulatory Visit (INDEPENDENT_AMBULATORY_CARE_PROVIDER_SITE_OTHER): Admitting: *Deleted

## 2024-06-07 VITALS — BP 101/51 | HR 61 | Temp 98.1°F | Resp 22 | Ht 64.0 in | Wt 139.2 lb

## 2024-06-07 DIAGNOSIS — D509 Iron deficiency anemia, unspecified: Secondary | ICD-10-CM

## 2024-06-07 MED ORDER — IRON SUCROSE 20 MG/ML IV SOLN
200.0000 mg | Freq: Once | INTRAVENOUS | Status: AC
  Administered 2024-06-07: 200 mg via INTRAVENOUS
  Filled 2024-06-07: qty 10

## 2024-06-07 MED ORDER — SODIUM CHLORIDE 0.9 % IV BOLUS
250.0000 mL | Freq: Once | INTRAVENOUS | Status: AC
  Administered 2024-06-07: 250 mL via INTRAVENOUS
  Filled 2024-06-07: qty 250

## 2024-06-07 NOTE — Progress Notes (Signed)
 Diagnosis: Iron Deficiency Anemia  Provider:  Chilton Greathouse MD  Procedure: IV Push  IV Type: Peripheral, IV Location: L Antecubital  Venofer (Iron Sucrose), Dose: 200 mg  Post Infusion IV Care: Observation period completed and Peripheral IV Discontinued  Discharge: Condition: Good, Destination: Home . AVS Provided  Performed by:  Forrest Moron, RN

## 2024-06-08 ENCOUNTER — Encounter (HOSPITAL_COMMUNITY)

## 2024-06-08 NOTE — Progress Notes (Signed)
 Cardiac Individual Treatment Plan  Patient Details  Name: [name] MRN: [id number] Date of Birth: 07/19/1941 Referring Provider:   Flowsheet Row INTENSIVE CARDIAC REHAB ORIENT from 05/22/2024 in [facility]  Referring Provider Ladona Heinz, MD    Initial Encounter Date:  Flowsheet Row INTENSIVE CARDIAC REHAB ORIENT from 05/22/2024 in [facility]  Date 05/22/24    Visit Diagnosis: S/P MVR (mitral valve repair)  S/P TVR (tricuspid valve repair)  Patient's Home Medications on Admission:  Current Outpatient Medications:    acetaminophen  (TYLENOL ) 500 MG tablet, Take 1-2 tablets (500-1,000 mg total) by mouth every 6 (six) hours as needed for mild pain (pain score 1-3) or fever. (Patient taking differently: Take 500-1,000 mg by mouth as needed for mild pain (pain score 1-3) or fever.), Disp: , Rfl:    amiodarone  (PACERONE ) 200 MG tablet, Take 1 tablet (200 mg total) by mouth daily., Disp: 90 tablet, Rfl: 3   amoxicillin (AMOXIL) 500 MG capsule, Take 500 mg by mouth as needed. Patient takes 4 capsule before dental procedures, Disp: , Rfl:    apixaban  (ELIQUIS ) 2.5 MG TABS tablet, Take 1 tablet (2.5 mg total) by mouth 2 (two) times daily., Disp: 60 tablet, Rfl: 11   atorvastatin  (LIPITOR) 10 MG tablet, Take 1 tablet (10 mg total) by mouth daily., Disp: 90 tablet, Rfl: 3   b complex vitamins tablet, Take 1 tablet by mouth daily.  , Disp: , Rfl:    CALCIUM  PO, Take 650 mg by mouth daily., Disp: , Rfl:    Cholecalciferol (VITAMIN D3) 125 MCG (5000 UT) CAPS, Take 5,000 Units by mouth daily., Disp: , Rfl:    denosumab  (PROLIA ) 60 MG/ML SOSY injection, Inject 60 mg into the skin every 6 (six) months., Disp: , Rfl:    gabapentin (NEURONTIN) 100 MG capsule, Take 100 mg by mouth 2 (two) times daily., Disp: , Rfl:    magnesium  gluconate (MAGONATE) 500 (27 Mg) MG TABS tablet, Take 1  tablet (500 mg total) by mouth daily., Disp: , Rfl:    melatonin 3 MG TABS tablet, Take 1 tablet (3 mg total) by mouth at bedtime. (Patient not taking: Reported on 05/28/2024), Disp: , Rfl:    Misc Natural Products (GLUCOSAMINE CHOND MSM FORMULA) TABS, Take 1 tablet by mouth as needed. (Patient not taking: Reported on 05/28/2024), Disp: , Rfl:    Multiple Vitamins-Minerals (MULTIVITAMINS THER. W/MINERALS) TABS, Take 1 tablet by mouth daily.  , Disp: , Rfl:    neomycin-polymyxin b-dexamethasone  (MAXITROL) 3.5-10000-0.1 SUSP, INSTILL 1 DROP 4 TIMES A DAY IN LEFT EYE FOR 1 WEEK THEN TWICE A DAY FOR 2ND WEEK (Patient not taking: Reported on 05/22/2024), Disp: , Rfl:    pantoprazole  (PROTONIX ) 40 MG tablet, Take 40 mg by mouth daily., Disp: , Rfl:    phenol (CHLORASEPTIC) 1.4 % LIQD, Use as directed 1 spray in the mouth or throat as needed for throat irritation / pain. (Patient not taking: Reported on 05/22/2024), Disp: , Rfl:    Potassium Chloride  ER 20 MEQ TBCR, Take 1 tablet by mouth daily., Disp: , Rfl:    potassium chloride  SA (KLOR-CON  M) 20 MEQ tablet, Take 2 tablets (40 mEq total) by mouth daily. Take extra 2 tabs when you take metolazone , Disp: , Rfl:    Torsemide  40 MG TABS, Take by mouth. (Patient taking differently: Take 40 mg by mouth 2 (two) times daily.), Disp: ,  Rfl:    witch hazel-glycerin  (TUCKS) pad, Apply topically as needed for itching., Disp: , Rfl:    Zinc 50 MG TABS, Take 50 mg by mouth daily., Disp: , Rfl:   Past Medical History: Past Medical History:  Diagnosis Date   Acid reflux    Anemia    Arthritis    Atrial fibrillation (HCC)    Cataract    Diverticulosis of colon    Endometrial cancer (HCC)    1A grade endometrial ca   Essential hypertension 01/24/2009   Qualifier: Diagnosis of  By: Nelson-Smith CMA (AAMA), Dottie     Hx of radiation therapy 04/24/08-05/29/08& 8/12/,8/19,&06/27/08   external beam  through 7/09 ,then intracavity in 06/2008   Hypertension    Mitral valve  prolapse    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   Tricuspid regurgitation     Tobacco Use: Social History   Tobacco Use  Smoking Status Never  Smokeless Tobacco Never    Labs: Review Flowsheet  More data exists      Latest Ref Rng & Units 02/24/2024 02/25/2024 02/26/2024 02/27/2024 02/28/2024  Labs for ITP Cardiac and Pulmonary Rehab  O2 Saturation % 73.9  78  68.3  73.1  82.9     Capillary Blood Glucose: Lab Results  Component Value Date   GLUCAP 122 (H) 02/22/2024   GLUCAP 136 (H) 02/22/2024   GLUCAP 100 (H) 02/22/2024   GLUCAP 102 (H) 02/22/2024   GLUCAP 133 (H) 02/21/2024     Exercise Target Goals: Exercise Program Goal: Individual exercise prescription set using results from initial 6 min walk test and THRR while considering  patient's activity barriers and safety.   Exercise Prescription Goal: Initial exercise prescription builds to 30-45 minutes a day of aerobic activity, 2-3 days per week.  Home exercise guidelines will be given to patient during program as part of exercise prescription that the participant will acknowledge.  Activity Barriers & Risk Stratification:  Activity Barriers & Cardiac Risk Stratification - 05/22/24 1333       Activity Barriers & Cardiac Risk Stratification   Activity Barriers Arthritis;Assistive Device;Balance Concerns;History of Falls    Cardiac Risk Stratification High          6 Minute Walk:  6 Minute Walk     Row Name 05/22/24 1454         6 Minute Walk   Phase Initial     Distance 747 feet     Walk Time 6 minutes     # of Rest Breaks 0     MPH 1.41     METS 1.25     RPE 11     Perceived Dyspnea  0     VO2 Peak 4.37     Symptoms No     Resting HR 75 bpm     Resting BP 90/50     Resting Oxygen Saturation  96 %     Exercise Oxygen Saturation  during 6 min walk 96 %     Max Ex. HR 88 bpm     Max Ex. BP 106/62     2 Minute Post BP 100/50        Oxygen Initial Assessment:   Oxygen  Re-Evaluation:   Oxygen Discharge (Final Oxygen Re-Evaluation):   Initial Exercise Prescription:  Initial Exercise Prescription - 05/22/24 1500       Date of Initial Exercise RX and Referring Provider   Date 05/22/24    Referring Provider Ladona Heinz, MD  Expected Discharge Date 08/17/24      NuStep   Level 1    SPM 85    Minutes 15    METs 1.5      Track   Laps 9    Minutes 15    METs 2.15      Prescription Details   Frequency (times per week) 3    Duration Progress to 30 minutes of continuous aerobic without signs/symptoms of physical distress      Intensity   THRR 40-80% of Max Heartrate 56-111    Ratings of Perceived Exertion 11-13    Perceived Dyspnea 0-4      Progression   Progression Continue to progress workloads to maintain intensity without signs/symptoms of physical distress.      Resistance Training   Training Prescription Yes    Weight 2 lbs    Reps 10-15          Perform Capillary Blood Glucose checks as needed.  Exercise Prescription Changes:   Exercise Prescription Changes     Row Name 05/28/24 1651             Response to Exercise   Blood Pressure (Admit) 96/68       Blood Pressure (Exercise) 100/62       Blood Pressure (Exit) 114/62       Heart Rate (Admit) 77 bpm       Heart Rate (Exercise) 85 bpm       Heart Rate (Exit) 81 bpm       Rating of Perceived Exertion (Exercise) 13       Perceived Dyspnea (Exercise) 0       Symptoms Mild lightheaded pre exercise. Seated wts/stretches       Comments Pt first day in the Prit[facility]in ICR prgram       Duration Progress to 30 minutes of  aerobic without signs/symptoms of physical distress       Intensity THRR unchanged         Progression   Progression Continue to progress workloads to maintain intensity without signs/symptoms of physical distress.       Average METs 1.4         Resistance Training   Training Prescription Yes       Weight 2 lbs       Reps 10-15       Time 10  Minutes         NuStep   Level 1       SPM 47       Minutes 30       METs 1.4          Exercise Comments:   Exercise Comments     Row Name 05/28/24 1656           Exercise Comments Pt first day in the P[facility] ICR program. Pt tolerated exercise well with an average MET level of 1.4. Pt is off to a good start and is learning her THRR, RPE and ExRx. Pt mild lightheaded pre exercise, seated wts and stretches for comfort          Exercise Goals and Review:   Exercise Goals     Row Name 05/22/24 1333             Exercise Goals   Increase Physical Activity Yes       Intervention Provide advice, education, support and counseling about physical activity/exercise needs.;Develop an individualized exercise prescription for aerobic and resistive training based  on initial evaluation findings, risk stratification, comorbidities and participant's personal goals.       Expected Outcomes Short Term: Attend rehab on a regular basis to increase amount of physical activity.;Long Term: Exercising regularly at least 3-5 days a week.;Long Term: Add in home exercise to make exercise part of routine and to increase amount of physical activity.       Increase Strength and Stamina Yes       Intervention Provide advice, education, support and counseling about physical activity/exercise needs.;Develop an individualized exercise prescription for aerobic and resistive training based on initial evaluation findings, risk stratification, comorbidities and participant's personal goals.       Expected Outcomes Short Term: Increase workloads from initial exercise prescription for resistance, speed, and METs.;Short Term: Perform resistance training exercises routinely during rehab and add in resistance training at home;Long Term: Improve cardiorespiratory fitness, muscular endurance and strength as measured by increased METs and functional capacity ( )       Able to understand and use rate of perceived exertion  (RPE) scale Yes       Intervention Provide education and explanation on how to use RPE scale       Expected Outcomes Short Term: Able to use RPE daily in rehab to express subjective intensity level;Long Term:  Able to use RPE to guide intensity level when exercising independently       Knowledge and understanding of Target Heart Rate Range (THRR) Yes       Intervention Provide education and explanation of THRR including how the numbers were predicted and where they are located for reference       Expected Outcomes Short Term: Able to state/look up THRR;Long Term: Able to use THRR to govern intensity when exercising independently;Short Term: Able to use daily as guideline for intensity in rehab       Able to check pulse independently Yes       Intervention Provide education and demonstration on how to check pulse in carotid and radial arteries.;Review the importance of being able to check your own pulse for safety during independent exercise       Expected Outcomes Short Term: Able to explain why pulse checking is important during independent exercise;Long Term: Able to check pulse independently and accurately       Understanding of Exercise Prescription Yes       Intervention Provide education, explanation, and written materials on patient's individual exercise prescription       Expected Outcomes Short Term: Able to explain program exercise prescription;Long Term: Able to explain home exercise prescription to exercise independently          Exercise Goals Re-Evaluation :  Exercise Goals Re-Evaluation     Row Name 05/28/24 1655             Exercise Goal Re-Evaluation   Exercise Goals Review Increase Physical Activity;Understanding of Exercise Prescription;Increase Strength and Stamina;Knowledge and understanding of Target Heart Rate Range (THRR);Able to understand and use rate of perceived exertion (RPE) scale       Comments Pt first day in the [facility] ICR program. Pt tolerated exercise  well with an average MET level of 1.4. Pt is off to a good start and is learning her THRR, RPE and ExRx. Pt mild lightheaded pre exercise, seated wts and stretches for comfort       Expected Outcomes will continue to monitor pt and progress workloads as tolerated without sign or symptom  Discharge Exercise Prescription (Final Exercise Prescription Changes):  Exercise Prescription Changes - 05/28/24 1651       Response to Exercise   Blood Pressure (Admit) 96/68    Blood Pressure (Exercise) 100/62    Blood Pressure (Exit) 114/62    Heart Rate (Admit) 77 bpm    Heart Rate (Exercise) 85 bpm    Heart Rate (Exit) 81 bpm    Rating of Perceived Exertion (Exercise) 13    Perceived Dyspnea (Exercise) 0    Symptoms Mild lightheaded pre exercise. Seated wts/stretches    Comments Pt first day in the Prit[facility]in ICR prgram    Duration Progress to 30 minutes of  aerobic without signs/symptoms of physical distress    Intensity THRR unchanged      Progression   Progression Continue to progress workloads to maintain intensity without signs/symptoms of physical distress.    Average METs 1.4      Resistance Training   Training Prescription Yes    Weight 2 lbs    Reps 10-15    Time 10 Minutes      NuStep   Level 1    SPM 47    Minutes 30    METs 1.4          Nutrition:  Target Goals: Understanding of nutrition guidelines, daily intake of sodium 1500mg , cholesterol 200mg , calories 30% from fat and 7% or less from saturated fats, daily to have 5 or more servings of fruits and vegetables.  Biometrics:  Pre Biometrics - 05/22/24 1319       Pre Biometrics   Waist Circumference 31.13 inches    Hip Circumference 36.75 inches    Waist to Hip Ratio 0.85 %    Triceps Skinfold 8 mm    % Body Fat 29.6 %    Grip Strength 18 kg    Flexibility 13 in    Single Leg Stand --   Unable to perform          Nutrition Therapy Plan and Nutrition Goals:  Nutrition Therapy & Goals -  05/28/24 1554       Nutrition Therapy   Diet Heart healthy diet      Personal Nutrition Goals   Nutrition Goal Patient to identify strategies for reducing cardiovascular risk by attending the [facility] education and nutrition series weekly.    Personal Goal #2 Patient to improve diet quality by using the plate method as a guide for meal planning to include lean protein/plant protein, fruits, vegetables, whole grains, nonfat dairy as part of a well-balanced diet.    Comments Kathryn has medical history of hypertension, hyperlipidemia, CKD stage III, diverticulosis, endometrial cancer in 2009 treated with surgery and radiation therapy, mitral valve prolapse and MvR, chronic dyspnea on exertion, iron  deficiency anemia. She is down ~25# since prior to surgery on 01/13/24 (158#). She reports improved appetite and is motivated to maintain weight at this time. Reviewed protein intake and protein sources. She continues to enjoy a wide variety of foods and follows a low sodium diet. Patient will benefit from participation in intensive cardiac rehab for nutrition education, exercise, and lifestyle modification.      Intervention Plan   Intervention Prescribe, educate and counsel regarding individualized specific dietary modifications aiming towards targeted core components such as weight, hypertension, lipid management, diabetes, heart failure and other comorbidities.;Nutrition handout(s) given to patient.    Expected Outcomes Short Term Goal: Understand basic principles of dietary content, such as calories, fat, sodium, cholesterol and nutrients.;Long Term Goal:  Adherence to prescribed nutrition plan.          Nutrition Assessments:  MEDIFICTS Score Key: >=70 Need to make dietary changes  40-70 Heart Healthy Diet <= 40 Therapeutic Level Cholesterol Diet    Picture Your Plate Scores: <59 Unhealthy dietary pattern with much room for improvement. 41-50 Dietary pattern unlikely to meet recommendations for  good health and room for improvement. 51-60 More healthful dietary pattern, with some room for improvement.  >60 Healthy dietary pattern, although there may be some specific behaviors that could be improved.    Nutrition Goals Re-Evaluation:  Nutrition Goals Re-Evaluation     Row Name 05/28/24 1554             Goals   Current Weight 132 lb 11.5 oz (60.2 kg)       Comment Lpa 107, A1c WNL, hemoglobin 10.9       Expected Outcome Mitchell has medical history of hypertension, hyperlipidemia, CKD stage III, diverticulosis, endometrial cancer in 2009 treated with surgery and radiation therapy, mitral valve prolapse and MvR, chronic dyspnea on exertion, iron  deficiency anemia. She is down ~25# since prior to surgery on 01/13/24 (158#). She reports improved appetite and is motivated to maintain weight at this time. Reviewed protein intake and protein sources. She continues to enjoy a wide variety of foods and follows a low sodium diet. Patient will benefit from participation in intensive cardiac rehab for nutrition education, exercise, and lifestyle modification.          Nutrition Goals Re-Evaluation:  Nutrition Goals Re-Evaluation     Row Name 05/28/24 1554             Goals   Current Weight 132 lb 11.5 oz (60.2 kg)       Comment Lpa 107, A1c WNL, hemoglobin 10.9       Expected Outcome Sefora has medical history of hypertension, hyperlipidemia, CKD stage III, diverticulosis, endometrial cancer in 2009 treated with surgery and radiation therapy, mitral valve prolapse and MvR, chronic dyspnea on exertion, iron  deficiency anemia. She is down ~25# since prior to surgery on 01/13/24 (158#). She reports improved appetite and is motivated to maintain weight at this time. Reviewed protein intake and protein sources. She continues to enjoy a wide variety of foods and follows a low sodium diet. Patient will benefit from participation in intensive cardiac rehab for nutrition education, exercise, and  lifestyle modification.          Nutrition Goals Discharge (Final Nutrition Goals Re-Evaluation):  Nutrition Goals Re-Evaluation - 05/28/24 1554       Goals   Current Weight 132 lb 11.5 oz (60.2 kg)    Comment Lpa 107, A1c WNL, hemoglobin 10.9    Expected Outcome Libbie has medical history of hypertension, hyperlipidemia, CKD stage III, diverticulosis, endometrial cancer in 2009 treated with surgery and radiation therapy, mitral valve prolapse and MvR, chronic dyspnea on exertion, iron  deficiency anemia. She is down ~25# since prior to surgery on 01/13/24 (158#). She reports improved appetite and is motivated to maintain weight at this time. Reviewed protein intake and protein sources. She continues to enjoy a wide variety of foods and follows a low sodium diet. Patient will benefit from participation in intensive cardiac rehab for nutrition education, exercise, and lifestyle modification.          Psychosocial: Target Goals: Acknowledge presence or absence of significant depression and/or stress, maximize coping skills, provide positive support system. Participant is able to verbalize types and ability to use  techniques and skills needed for reducing stress and depression.  Initial Review & Psychosocial Screening:  Initial Psych Review & Screening - 05/22/24 1431       Initial Review   Current issues with None Identified      Family Dynamics   Good Support System? Yes    Comments Clarissa has excellent support from her husband.      Barriers   Psychosocial barriers to participate in program There are no identifiable barriers or psychosocial needs.      Screening Interventions   Interventions Encouraged to exercise    Expected Outcomes Short Term goal: Identification and review with participant of any Quality of Life or Depression concerns found by scoring the questionnaire.;Long Term goal: The participant improves quality of Life and PHQ9 Scores as seen by post scores and/or  verbalization of changes          Quality of Life Scores:  Quality of Life - 05/22/24 1556       Quality of Life   Select Quality of Life      Quality of Life Scores   Health/Function Pre 28.18 %    Socioeconomic Pre 30 %    Psych/Spiritual Pre 30 %    Family Pre 30 %    GLOBAL Pre 29.23 %         Scores of 19 and below usually indicate a poorer quality of life in these areas.  A difference of  2-3 points is a clinically meaningful difference.  A difference of 2-3 points in the total score of the Quality of Life Index has been associated with significant improvement in overall quality of life, self-image, physical symptoms, and general health in studies assessing change in quality of life.  PHQ-9: Review Flowsheet       05/22/2024 12/20/2023  Depression screen PHQ 2/9  Decreased Interest 0 0  Down, Depressed, Hopeless 0 0  PHQ - 2 Score 0 0  Altered sleeping 3 -  Tired, decreased energy 1 -  Change in appetite 0 -  Feeling bad or failure about yourself  0 -  Trouble concentrating 0 -  Moving slowly or fidgety/restless 0 -  Suicidal thoughts 0 -  PHQ-9 Score 4 -  Difficult doing work/chores Not difficult at all -   Interpretation of Total Score  Total Score Depression Severity:  1-4 = Minimal depression, 5-9 = Mild depression, 10-14 = Moderate depression, 15-19 = Moderately severe depression, 20-27 = Severe depression   Psychosocial Evaluation and Intervention:   Psychosocial Re-Evaluation:  Psychosocial Re-Evaluation     Row Name 05/29/24 0804 06/08/24 1312           Psychosocial Re-Evaluation   Current issues with None Identified None Identified      Interventions Encouraged to attend Cardiac Rehabilitation for the exercise Encouraged to attend Cardiac Rehabilitation for the exercise      Continue Psychosocial Services  No Follow up required No Follow up required         Psychosocial Discharge (Final Psychosocial Re-Evaluation):  Psychosocial  Re-Evaluation - 06/08/24 1312       Psychosocial Re-Evaluation   Current issues with None Identified    Interventions Encouraged to attend Cardiac Rehabilitation for the exercise    Continue Psychosocial Services  No Follow up required          Vocational Rehabilitation: Provide vocational rehab assistance to qualifying candidates.   Vocational Rehab Evaluation & Intervention:  Vocational Rehab - 05/22/24 1432  Initial Vocational Rehab Evaluation & Intervention   Assessment shows need for Vocational Rehabilitation No      Vocational Rehab Re-Evaulation   Comments Deziray is currently working and dosn't have any vocational rehab needs at this time. Cleared to return to work on 06/01/24. Five hours, five days per week. Full-time in january.          Education: Education Goals: Education classes will be provided on a weekly basis, covering required topics. Participant will state understanding/return demonstration of topics presented.    Education     Row Name 05/28/24 1600     Education   Cardiac Education Topics Pritikin   Nurse, children's Exercise Physiologist   Select Psychosocial   Psychosocial Healthy Minds, Bodies, Hearts   Instruction Review Code 1- Verbalizes Understanding   Class Start Time 1415   Class Stop Time 1450   Class Time Calculation (min) 35 min    Row Name 06/01/24 1400     Education   Cardiac Education Topics Pritikin   Select Workshops     Workshops   Educator Exercise Physiologist   Select Exercise   Exercise Workshop Location manager and Fall Prevention   Instruction Review Code 1- Verbalizes Understanding   Class Start Time 1402   Class Stop Time 1441   Class Time Calculation (min) 39 min    Row Name 06/04/24 1500     Education   Cardiac Education Topics Pritikin   Glass blower/designer Nutrition   Nutrition Workshop Label Reading   Instruction Review  Code 1- Tax inspector   Class Start Time 1400   Class Stop Time 1450   Class Time Calculation (min) 50 min    Row Name 06/06/24 1500     Education   Cardiac Education Topics Pritikin   Customer service manager   Weekly Topic Fast and Healthy Breakfasts   Instruction Review Code 1- Verbalizes Understanding   Class Start Time 1400   Class Stop Time 1440   Class Time Calculation (min) 40 min      Core Videos: Exercise    Move It!  Clinical staff conducted group or individual video education with verbal and written material and guidebook.  Patient learns the recommended Pritikin exercise program. Exercise with the goal of living a long, healthy life. Some of the health benefits of exercise include controlled diabetes, healthier blood pressure levels, improved cholesterol levels, improved heart and lung capacity, improved sleep, and better body composition. Everyone should speak with their doctor before starting or changing an exercise routine.  Biomechanical Limitations Clinical staff conducted group or individual video education with verbal and written material and guidebook.  Patient learns how biomechanical limitations can impact exercise and how we can mitigate and possibly overcome limitations to have an impactful and balanced exercise routine.  Body Composition Clinical staff conducted group or individual video education with verbal and written material and guidebook.  Patient learns that body composition (ratio of muscle mass to fat mass) is a key component to assessing overall fitness, rather than body weight alone. Increased fat mass, especially visceral belly fat, can put us  at increased risk for metabolic syndrome, type 2 diabetes, heart disease, and even death. It is recommended to combine diet and exercise (cardiovascular and resistance training) to improve your body composition. Seek guidance from your physician  and exercise  physiologist before implementing an exercise routine.  Exercise Action Plan Clinical staff conducted group or individual video education with verbal and written material and guidebook.  Patient learns the recommended strategies to achieve and enjoy long-term exercise adherence, including variety, self-motivation, self-efficacy, and positive decision making. Benefits of exercise include fitness, good health, weight management, more energy, better sleep, less stress, and overall well-being.  Medical   Heart Disease Risk Reduction Clinical staff conducted group or individual video education with verbal and written material and guidebook.  Patient learns our heart is our most vital organ as it circulates oxygen, nutrients, white blood cells, and hormones throughout the entire body, and carries waste away. Data supports a plant-based eating plan like the Pritikin Program for its effectiveness in slowing progression of and reversing heart disease. The video provides a number of recommendations to address heart disease.   Metabolic Syndrome and Belly Fat  Clinical staff conducted group or individual video education with verbal and written material and guidebook.  Patient learns what metabolic syndrome is, how it leads to heart disease, and how one can reverse it and keep it from coming back. You have metabolic syndrome if you have 3 of the following 5 criteria: abdominal obesity, high blood pressure, high triglycerides, low HDL cholesterol, and high blood sugar.  Hypertension and Heart Disease Clinical staff conducted group or individual video education with verbal and written material and guidebook.  Patient learns that high blood pressure, or hypertension, is very common in the [place] . Hypertension is largely due to excessive salt intake, but other important risk factors include being overweight, physical inactivity, drinking too much alcohol, smoking, and not eating enough potassium from fruits  and vegetables. High blood pressure is a leading risk factor for heart attack, stroke, congestive heart failure, dementia, kidney failure, and premature death. Long-term effects of excessive salt intake include stiffening of the arteries and thickening of heart muscle and organ damage. Recommendations include ways to reduce hypertension and the risk of heart disease.  Diseases of Our Time - Focusing on Diabetes Clinical staff conducted group or individual video education with verbal and written material and guidebook.  Patient learns why the best way to stop diseases of our time is prevention, through food and other lifestyle changes. Medicine (such as prescription pills and surgeries) is often only a Band-Aid on the problem, not a long-term solution. Most common diseases of our time include obesity, type 2 diabetes, hypertension, heart disease, and cancer. The Pritikin Program is recommended and has been proven to help reduce, reverse, and/or prevent the damaging effects of metabolic syndrome.  Nutrition   Overview of the Pritikin Eating Plan  Clinical staff conducted group or individual video education with verbal and written material and guidebook.  Patient learns about the Pritikin Eating Plan for disease risk reduction. The Pritikin Eating Plan emphasizes a wide variety of unrefined, minimally-processed carbohydrates, like fruits, vegetables, whole grains, and legumes. Go, Caution, and Stop food choices are explained. Plant-based and lean animal proteins are emphasized. Rationale provided for low sodium intake for blood pressure control, low added sugars for blood sugar stabilization, and low added fats and oils for coronary artery disease risk reduction and weight management.  Calorie Density  Clinical staff conducted group or individual video education with verbal and written material and guidebook.  Patient learns about calorie density and how it impacts the Pritikin Eating Plan. Knowing the  characteristics of the food you choose will help you decide whether those foods  will lead to weight gain or weight loss, and whether you want to consume more or less of them. Weight loss is usually a side effect of the Pritikin Eating Plan because of its focus on low calorie-dense foods.  Label Reading  Clinical staff conducted group or individual video education with verbal and written material and guidebook.  Patient learns about the Pritikin recommended label reading guidelines and corresponding recommendations regarding calorie density, added sugars, sodium content, and whole grains.  Dining Out - Part 1  Clinical staff conducted group or individual video education with verbal and written material and guidebook.  Patient learns that restaurant meals can be sabotaging because they can be so high in calories, fat, sodium, and/or sugar. Patient learns recommended strategies on how to positively address this and avoid unhealthy pitfalls.  Facts on Fats  Clinical staff conducted group or individual video education with verbal and written material and guidebook.  Patient learns that lifestyle modifications can be just as effective, if not more so, as many medications for lowering your risk of heart disease. A Pritikin lifestyle can help to reduce your risk of inflammation and atherosclerosis (cholesterol build-up, or plaque, in the artery walls). Lifestyle interventions such as dietary choices and physical activity address the cause of atherosclerosis. A review of the types of fats and their impact on blood cholesterol levels, along with dietary recommendations to reduce fat intake is also included.  Nutrition Action Plan  Clinical staff conducted group or individual video education with verbal and written material and guidebook.  Patient learns how to incorporate Pritikin recommendations into their lifestyle. Recommendations include planning and keeping personal health goals in mind as an important  part of their success.  Healthy Mind-Set    Healthy Minds, Bodies, Hearts  Clinical staff conducted group or individual video education with verbal and written material and guidebook.  Patient learns how to identify when they are stressed. Video will discuss the impact of that stress, as well as the many benefits of stress management. Patient will also be introduced to stress management techniques. The way we think, act, and feel has an impact on our hearts.  How Our Thoughts Can Heal Our Hearts  Clinical staff conducted group or individual video education with verbal and written material and guidebook.  Patient learns that negative thoughts can cause depression and anxiety. This can result in negative lifestyle behavior and serious health problems. Cognitive behavioral therapy is an effective method to help control our thoughts in order to change and improve our emotional outlook.  Additional Videos:  Exercise    Improving Performance  Clinical staff conducted group or individual video education with verbal and written material and guidebook.  Patient learns to use a non-linear approach by alternating intensity levels and lengths of time spent exercising to help burn more calories and lose more body fat. Cardiovascular exercise helps improve heart health, metabolism, hormonal balance, blood sugar control, and recovery from fatigue. Resistance training improves strength, endurance, balance, coordination, reaction time, metabolism, and muscle mass. Flexibility exercise improves circulation, posture, and balance. Seek guidance from your physician and exercise physiologist before implementing an exercise routine and learn your capabilities and proper form for all exercise.  Introduction to Yoga  Clinical staff conducted group or individual video education with verbal and written material and guidebook.  Patient learns about yoga, a discipline of the coming together of mind, breath, and body. The  benefits of yoga include improved flexibility, improved range of motion, better posture and core strength,  increased lung function, weight loss, and positive self-image. Yoga's heart health benefits include lowered blood pressure, healthier heart rate, decreased cholesterol and triglyceride levels, improved immune function, and reduced stress. Seek guidance from your physician and exercise physiologist before implementing an exercise routine and learn your capabilities and proper form for all exercise.  Medical   Aging: Enhancing Your Quality of Life  Clinical staff conducted group or individual video education with verbal and written material and guidebook.  Patient learns key strategies and recommendations to stay in good physical health and enhance quality of life, such as prevention strategies, having an advocate, securing a Health Care Proxy and Power of Attorney, and keeping a list of medications and system for tracking them. It also discusses how to avoid risk for bone loss.  Biology of Weight Control  Clinical staff conducted group or individual video education with verbal and written material and guidebook.  Patient learns that weight gain occurs because we consume more calories than we burn (eating more, moving less). Even if your body weight is normal, you may have higher ratios of fat compared to muscle mass. Too much body fat puts you at increased risk for cardiovascular disease, heart attack, stroke, type 2 diabetes, and obesity-related cancers. In addition to exercise, following the Pritikin Eating Plan can help reduce your risk.  Decoding Lab Results  Clinical staff conducted group or individual video education with verbal and written material and guidebook.  Patient learns that lab test reflects one measurement whose values change over time and are influenced by many factors, including medication, stress, sleep, exercise, food, hydration, pre-existing medical conditions, and more. It is  recommended to use the knowledge from this video to become more involved with your lab results and evaluate your numbers to speak with your doctor.   Diseases of Our Time - Overview  Clinical staff conducted group or individual video education with verbal and written material and guidebook.  Patient learns that according to the CDC, 50% to 70% of chronic diseases (such as obesity, type 2 diabetes, elevated lipids, hypertension, and heart disease) are avoidable through lifestyle improvements including healthier food choices, listening to satiety cues, and increased physical activity.  Sleep Disorders Clinical staff conducted group or individual video education with verbal and written material and guidebook.  Patient learns how good quality and duration of sleep are important to overall health and well-being. Patient also learns about sleep disorders and how they impact health along with recommendations to address them, including discussing with a physician.  Nutrition  Dining Out - Part 2 Clinical staff conducted group or individual video education with verbal and written material and guidebook.  Patient learns how to plan ahead and communicate in order to maximize their dining experience in a healthy and nutritious manner. Included are recommended food choices based on the type of restaurant the patient is visiting.   Fueling a Banker conducted group or individual video education with verbal and written material and guidebook.  There is a strong connection between our food choices and our health. Diseases like obesity and type 2 diabetes are very prevalent and are in large-part due to lifestyle choices. The Pritikin Eating Plan provides plenty of food and hunger-curbing satisfaction. It is easy to follow, affordable, and helps reduce health risks.  Menu Workshop  Clinical staff conducted group or individual video education with verbal and written material and guidebook.   Patient learns that restaurant meals can sabotage health goals because they are often  packed with calories, fat, sodium, and sugar. Recommendations include strategies to plan ahead and to communicate with the manager, chef, or server to help order a healthier meal.  Planning Your Eating Strategy  Clinical staff conducted group or individual video education with verbal and written material and guidebook.  Patient learns about the Pritikin Eating Plan and its benefit of reducing the risk of disease. The Pritikin Eating Plan does not focus on calories. Instead, it emphasizes high-quality, nutrient-rich foods. By knowing the characteristics of the foods, we choose, we can determine their calorie density and make informed decisions.  Targeting Your Nutrition Priorities  Clinical staff conducted group or individual video education with verbal and written material and guidebook.  Patient learns that lifestyle habits have a tremendous impact on disease risk and progression. This video provides eating and physical activity recommendations based on your personal health goals, such as reducing LDL cholesterol, losing weight, preventing or controlling type 2 diabetes, and reducing high blood pressure.  Vitamins and Minerals  Clinical staff conducted group or individual video education with verbal and written material and guidebook.  Patient learns different ways to obtain key vitamins and minerals, including through a recommended healthy diet. It is important to discuss all supplements you take with your doctor.   Healthy Mind-Set    Smoking Cessation  Clinical staff conducted group or individual video education with verbal and written material and guidebook.  Patient learns that cigarette smoking and tobacco addiction pose a serious health risk which affects millions of people. Stopping smoking will significantly reduce the risk of heart disease, lung disease, and many forms of cancer. Recommended  strategies for quitting are covered, including working with your doctor to develop a successful plan.  Culinary   Becoming a Set designer conducted group or individual video education with verbal and written material and guidebook.  Patient learns that cooking at home can be healthy, cost-effective, quick, and puts them in control. Keys to cooking healthy recipes will include looking at your recipe, assessing your equipment needs, planning ahead, making it simple, choosing cost-effective seasonal ingredients, and limiting the use of added fats, salts, and sugars.  Cooking - Breakfast and Snacks  Clinical staff conducted group or individual video education with verbal and written material and guidebook.  Patient learns how important breakfast is to satiety and nutrition through the entire day. Recommendations include key foods to eat during breakfast to help stabilize blood sugar levels and to prevent overeating at meals later in the day. Planning ahead is also a key component.  Cooking - Educational psychologist conducted group or individual video education with verbal and written material and guidebook.  Patient learns eating strategies to improve overall health, including an approach to cook more at home. Recommendations include thinking of animal protein as a side on your plate rather than center stage and focusing instead on lower calorie dense options like vegetables, fruits, whole grains, and plant-based proteins, such as beans. Making sauces in large quantities to freeze for later and leaving the skin on your vegetables are also recommended to maximize your experience.  Cooking - Healthy Salads and Dressing Clinical staff conducted group or individual video education with verbal and written material and guidebook.  Patient learns that vegetables, fruits, whole grains, and legumes are the foundations of the Pritikin Eating Plan. Recommendations include how to  incorporate each of these in flavorful and healthy salads, and how to create homemade salad dressings. Proper handling of ingredients  is also covered. Cooking - Soups and [employer] - Soups and Desserts Clinical staff conducted group or individual video education with verbal and written material and guidebook.  Patient learns that Pritikin soups and desserts make for easy, nutritious, and delicious snacks and meal components that are low in sodium, fat, sugar, and calorie density, while high in vitamins, minerals, and filling fiber. Recommendations include simple and healthy ideas for soups and desserts.   Overview     The Pritikin Solution Program Overview Clinical staff conducted group or individual video education with verbal and written material and guidebook.  Patient learns that the results of the Pritikin Program have been documented in more than 100 articles published in peer-reviewed journals, and the benefits include reducing risk factors for (and, in some cases, even reversing) high cholesterol, high blood pressure, type 2 diabetes, obesity, and more! An overview of the three key pillars of the Pritikin Program will be covered: eating well, doing regular exercise, and having a healthy mind-set.  WORKSHOPS  Exercise: Exercise Basics: Building Your Action Plan Clinical staff led group instruction and group discussion with PowerPoint presentation and patient guidebook. To enhance the learning environment the use of posters, models and videos may be added. At the conclusion of this workshop, patients will comprehend the difference between physical activity and exercise, as well as the benefits of incorporating both, into their routine. Patients will understand the FITT (Frequency, Intensity, Time, and Type) principle and how to use it to build an exercise action plan. In addition, safety concerns and other considerations for exercise and cardiac rehab will be addressed by the  presenter. The purpose of this lesson is to promote a comprehensive and effective weekly exercise routine in order to improve patients' overall level of fitness.   Managing Heart Disease: Your Path to a Healthier Heart Clinical staff led group instruction and group discussion with PowerPoint presentation and patient guidebook. To enhance the learning environment the use of posters, models and videos may be added.At the conclusion of this workshop, patients will understand the anatomy and physiology of the heart. Additionally, they will understand how Pritikin's three pillars impact the risk factors, the progression, and the management of heart disease.  The purpose of this lesson is to provide a high-level overview of the heart, heart disease, and how the Pritikin lifestyle positively impacts risk factors.  Exercise Biomechanics Clinical staff led group instruction and group discussion with PowerPoint presentation and patient guidebook. To enhance the learning environment the use of posters, models and videos may be added. Patients will learn how the structural parts of their bodies function and how these functions impact their daily activities, movement, and exercise. Patients will learn how to promote a neutral spine, learn how to manage pain, and identify ways to improve their physical movement in order to promote healthy living. The purpose of this lesson is to expose patients to common physical limitations that impact physical activity. Participants will learn practical ways to adapt and manage aches and pains, and to minimize their effect on regular exercise. Patients will learn how to maintain good posture while sitting, walking, and lifting.  Balance Training and Fall Prevention  Clinical staff led group instruction and group discussion with PowerPoint presentation and patient guidebook. To enhance the learning environment the use of posters, models and videos may be added. At the  conclusion of this workshop, patients will understand the importance of their sensorimotor skills (vision, proprioception, and the vestibular system) in maintaining their ability to  balance as they age. Patients will apply a variety of balancing exercises that are appropriate for their current level of function. Patients will understand the common causes for poor balance, possible solutions to these problems, and ways to modify their physical environment in order to minimize their fall risk. The purpose of this lesson is to teach patients about the importance of maintaining balance as they age and ways to minimize their risk of falling.  WORKSHOPS   Nutrition:  Fueling a Ship broker led group instruction and group discussion with PowerPoint presentation and patient guidebook. To enhance the learning environment the use of posters, models and videos may be added. Patients will review the foundational principles of the Pritikin Eating Plan and understand what constitutes a serving size in each of the food groups. Patients will also learn Pritikin-friendly foods that are better choices when away from home and review make-ahead meal and snack options. Calorie density will be reviewed and applied to three nutrition priorities: weight maintenance, weight loss, and weight gain. The purpose of this lesson is to reinforce (in a group setting) the key concepts around what patients are recommended to eat and how to apply these guidelines when away from home by planning and selecting Pritikin-friendly options. Patients will understand how calorie density may be adjusted for different weight management goals.  Mindful Eating  Clinical staff led group instruction and group discussion with PowerPoint presentation and patient guidebook. To enhance the learning environment the use of posters, models and videos may be added. Patients will briefly review the concepts of the Pritikin Eating Plan and the  importance of low-calorie dense foods. The concept of mindful eating will be introduced as well as the importance of paying attention to internal hunger signals. Triggers for non-hunger eating and techniques for dealing with triggers will be explored. The purpose of this lesson is to provide patients with the opportunity to review the basic principles of the Pritikin Eating Plan, discuss the value of eating mindfully and how to measure internal cues of hunger and fullness using the Hunger Scale. Patients will also discuss reasons for non-hunger eating and learn strategies to use for controlling emotional eating.  Targeting Your Nutrition Priorities Clinical staff led group instruction and group discussion with PowerPoint presentation and patient guidebook. To enhance the learning environment the use of posters, models and videos may be added. Patients will learn how to determine their genetic susceptibility to disease by reviewing their family history. Patients will gain insight into the importance of diet as part of an overall healthy lifestyle in mitigating the impact of genetics and other environmental insults. The purpose of this lesson is to provide patients with the opportunity to assess their personal nutrition priorities by looking at their family history, their own health history and current risk factors. Patients will also be able to discuss ways of prioritizing and modifying the Pritikin Eating Plan for their highest risk areas  Menu  Clinical staff led group instruction and group discussion with PowerPoint presentation and patient guidebook. To enhance the learning environment the use of posters, models and videos may be added. Using menus brought in from [employer], or printed from [employer], patients will apply the Pritikin dining out guidelines that were presented in the [employer] video. Patients will also be able to practice these guidelines in a variety of  provided scenarios. The purpose of this lesson is to provide patients with the opportunity to practice hands-on learning of the Pritikin Dining  Out guidelines with actual menus and practice scenarios.  Label Reading Clinical staff led group instruction and group discussion with PowerPoint presentation and patient guidebook. To enhance the learning environment the use of posters, models and videos may be added. Patients will review and discuss the Pritikin label reading guidelines presented in Pritikin's Label Reading Educational series video. Using fool labels brought in from local grocery stores and markets, patients will apply the label reading guidelines and determine if the packaged food meet the Pritikin guidelines. The purpose of this lesson is to provide patients with the opportunity to review, discuss, and practice hands-on learning of the Pritikin Label Reading guidelines with actual packaged food labels. Cooking School  Pritikin's [employer] are designed to teach patients ways to prepare quick, simple, and affordable recipes at home. The importance of nutrition's role in chronic disease risk reduction is reflected in its emphasis in the overall Pritikin program. By learning how to prepare essential core Pritikin Eating Plan recipes, patients will increase control over what they eat; be able to customize the flavor of foods without the use of added salt, sugar, or fat; and improve the quality of the food they consume. By learning a set of core recipes which are easily assembled, quickly prepared, and affordable, patients are more likely to prepare more healthy foods at home. These workshops focus on convenient breakfasts, simple entres, side dishes, and desserts which can be prepared with minimal effort and are consistent with nutrition recommendations for cardiovascular risk reduction. Cooking [employer] are taught by a Armed forces logistics/support/administrative officer (RD) who has been trained by the  [employer]. The chef or RD has a clear understanding of the importance of minimizing - if not completely eliminating - added fat, sugar, and sodium in recipes. Throughout the series of Cooking School Workshop sessions, patients will learn about healthy ingredients and efficient methods of cooking to build confidence in their capability to prepare    Cooking School weekly topics:  Adding Flavor- Sodium-Free  Fast and Healthy Breakfasts  Powerhouse Plant-Based Proteins  Satisfying Salads and Dressings  Simple Sides and Sauces  International Cuisine-Spotlight on the [employer] Zones  Delicious Desserts  Savory Soups  [employer] - Meals in a Astronomer Appetizers and Snacks  Comforting Weekend Breakfasts  One-Pot Wonders   Fast Evening Meals  Landscape architect Your Pritikin Plate  WORKSHOPS   Healthy Mindset (Psychosocial):  Focused Goals, Sustainable Changes Clinical staff led group instruction and group discussion with PowerPoint presentation and patient guidebook. To enhance the learning environment the use of posters, models and videos may be added. Patients will be able to apply effective goal setting strategies to establish at least one personal goal, and then take consistent, meaningful action toward that goal. They will learn to identify common barriers to achieving personal goals and develop strategies to overcome them. Patients will also gain an understanding of how our mind-set can impact our ability to achieve goals and the importance of cultivating a positive and growth-oriented mind-set. The purpose of this lesson is to provide patients with a deeper understanding of how to set and achieve personal goals, as well as the tools and strategies needed to overcome common obstacles which may arise along the way.  From Head to Heart: The Power of a Healthy Outlook  Clinical staff led group instruction and group discussion with PowerPoint presentation  and patient guidebook. To enhance the learning environment the use of posters, models and videos may  be added. Patients will be able to recognize and describe the impact of emotions and mood on physical health. They will discover the importance of self-care and explore self-care practices which may work for them. Patients will also learn how to utilize the 4 C's to cultivate a healthier outlook and better manage stress and challenges. The purpose of this lesson is to demonstrate to patients how a healthy outlook is an essential part of maintaining good health, especially as they continue their cardiac rehab journey.  Healthy Sleep for a Healthy Heart Clinical staff led group instruction and group discussion with PowerPoint presentation and patient guidebook. To enhance the learning environment the use of posters, models and videos may be added. At the conclusion of this workshop, patients will be able to demonstrate knowledge of the importance of sleep to overall health, well-being, and quality of life. They will understand the symptoms of, and treatments for, common sleep disorders. Patients will also be able to identify daytime and nighttime behaviors which impact sleep, and they will be able to apply these tools to help manage sleep-related challenges. The purpose of this lesson is to provide patients with a general overview of sleep and outline the importance of quality sleep. Patients will learn about a few of the most common sleep disorders. Patients will also be introduced to the concept of "sleep hygiene," and discover ways to self-manage certain sleeping problems through simple daily behavior changes. Finally, the workshop will motivate patients by clarifying the links between quality sleep and their goals of heart-healthy living.   Recognizing and Reducing Stress Clinical staff led group instruction and group discussion with PowerPoint presentation and patient guidebook. To enhance the learning  environment the use of posters, models and videos may be added. At the conclusion of this workshop, patients will be able to understand the types of stress reactions, differentiate between acute and chronic stress, and recognize the impact that chronic stress has on their health. They will also be able to apply different coping mechanisms, such as reframing negative self-talk. Patients will have the opportunity to practice a variety of stress management techniques, such as deep abdominal breathing, progressive muscle relaxation, and/or guided imagery.  The purpose of this lesson is to educate patients on the role of stress in their lives and to provide healthy techniques for coping with it.  Learning Barriers/Preferences:  Learning Barriers/Preferences - 05/22/24 1336       Learning Barriers/Preferences   Learning Barriers Hearing    Learning Preferences Computer/Internet;Written Material          Education Topics:  Knowledge Questionnaire Score:  Knowledge Questionnaire Score - 05/22/24 1556       Knowledge Questionnaire Score   Pre Score 17/24          Core Components/Risk Factors/Patient Goals at Admission:  Personal Goals and Risk Factors at Admission - 05/22/24 1432       Core Components/Risk Factors/Patient Goals on Admission   Heart Failure Yes    Intervention Provide a combined exercise and nutrition program that is supplemented with education, support and counseling about heart failure. Directed toward relieving symptoms such as shortness of breath, decreased exercise tolerance, and extremity edema.    Expected Outcomes Improve functional capacity of life;Short term: Attendance in program 2-3 days a week with increased exercise capacity. Reported lower sodium intake. Reported increased fruit and vegetable intake. Reports medication compliance.;Short term: Daily weights obtained and reported for increase. Utilizing diuretic protocols set by physician.;Long term: Adoption of  self-care  skills and reduction of barriers for early signs and symptoms recognition and intervention leading to self-care maintenance.    Lipids Yes    Intervention Provide education and support for participant on nutrition & aerobic/resistive exercise along with prescribed medications to achieve LDL 70mg , HDL >40mg .    Expected Outcomes Short Term: Participant states understanding of desired cholesterol values and is compliant with medications prescribed. Participant is following exercise prescription and nutrition guidelines.;Long Term: Cholesterol controlled with medications as prescribed, with individualized exercise RX and with personalized nutrition plan. Value goals: LDL < 70mg , HDL > 40 mg.          Core Components/Risk Factors/Patient Goals Review:   Goals and Risk Factor Review     Row Name 05/29/24 0805 06/08/24 1319           Core Components/Risk Factors/Patient Goals Review   Personal Goals Review Weight Management/Obesity;Heart Failure;Lipids Weight Management/Obesity;Heart Failure;Lipids      Review Amarii started cardiac rehab on 05/28/24. Adriyana did fair with exercise. Initial; systolic BP was in the 90's. Patient reported feeling lightheaded. Symptoms resolved after getting water. BP's improved with activity Ineze started cardiac rehab on 05/28/24. Stassi is off to a good start to exercise. Recent vital signs have been stable.      Expected Outcomes Millenia will continue to participate in cardiac rehab for exercise, nutrition and lifestyle modifications Carilyn will continue to participate in cardiac rehab for exercise, nutrition and lifestyle modifications         Core Components/Risk Factors/Patient Goals at Discharge (Final Review):   Goals and Risk Factor Review - 06/08/24 1319       Core Components/Risk Factors/Patient Goals Review   Personal Goals Review Weight Management/Obesity;Heart Failure;Lipids    Review Myla started cardiac rehab on 05/28/24. Margreat is off to a good  start to exercise. Recent vital signs have been stable.    Expected Outcomes Toby will continue to participate in cardiac rehab for exercise, nutrition and lifestyle modifications          ITP Comments:  ITP Comments     Row Name 05/22/24 1319 05/29/24 0759 06/08/24 1310       ITP Comments Medical Director- Dr. Wilbert Bihari, MD. Introduction to the [facility] Education/ [facility]. Reviewed initial orientation folder with patient. 30 Day ITP Review. Tieara started cardiac rehab on 05/28/24. Hausmann did fair with exercise for her fitness level. 30 Day ITP Review. Terena started cardiac rehab on 05/28/24. Slomski did fair with exercise for her fitness level.        Comments: See ITP comments.[name] Elpidio Quan RN BSN

## 2024-06-11 ENCOUNTER — Encounter (HOSPITAL_COMMUNITY)
Admission: RE | Admit: 2024-06-11 | Discharge: 2024-06-11 | Disposition: A | Discharge status: Home / Self Care | Source: Ambulatory Visit | Attending: Cardiology | Admitting: Cardiology

## 2024-06-11 DIAGNOSIS — Z9889 Other specified postprocedural states: Secondary | ICD-10-CM | POA: Diagnosis not present

## 2024-06-11 DIAGNOSIS — Z48812 Encounter for surgical aftercare following surgery on the circulatory system: Secondary | ICD-10-CM | POA: Diagnosis not present

## 2024-06-12 ENCOUNTER — Ambulatory Visit (INDEPENDENT_AMBULATORY_CARE_PROVIDER_SITE_OTHER)

## 2024-06-12 ENCOUNTER — Ambulatory Visit (HOSPITAL_COMMUNITY)
Admission: RE | Admit: 2024-06-12 | Discharge: 2024-06-12 | Disposition: A | Discharge status: Home / Self Care | Source: Ambulatory Visit | Attending: Internal Medicine | Admitting: Internal Medicine

## 2024-06-12 ENCOUNTER — Encounter (HOSPITAL_COMMUNITY): Payer: Self-pay | Admitting: Internal Medicine

## 2024-06-12 VITALS — BP 118/56 | HR 68 | Ht 64.0 in | Wt 137.2 lb

## 2024-06-12 VITALS — BP 100/56 | HR 72 | Temp 98.0°F | Resp 22 | Ht 64.0 in | Wt 135.6 lb

## 2024-06-12 DIAGNOSIS — I5022 Chronic systolic (congestive) heart failure: Secondary | ICD-10-CM

## 2024-06-12 DIAGNOSIS — I700 Atherosclerosis of aorta: Secondary | ICD-10-CM | POA: Diagnosis not present

## 2024-06-12 DIAGNOSIS — I083 Combined rheumatic disorders of mitral, aortic and tricuspid valves: Secondary | ICD-10-CM | POA: Insufficient documentation

## 2024-06-12 DIAGNOSIS — Q2112 Patent foramen ovale: Secondary | ICD-10-CM | POA: Insufficient documentation

## 2024-06-12 DIAGNOSIS — I10 Essential (primary) hypertension: Secondary | ICD-10-CM | POA: Diagnosis not present

## 2024-06-12 DIAGNOSIS — T148XXA Other injury of unspecified body region, initial encounter: Secondary | ICD-10-CM | POA: Diagnosis not present

## 2024-06-12 DIAGNOSIS — D509 Iron deficiency anemia, unspecified: Secondary | ICD-10-CM

## 2024-06-12 DIAGNOSIS — I110 Hypertensive heart disease with heart failure: Secondary | ICD-10-CM | POA: Insufficient documentation

## 2024-06-12 DIAGNOSIS — N1831 Chronic kidney disease, stage 3a: Secondary | ICD-10-CM | POA: Diagnosis not present

## 2024-06-12 DIAGNOSIS — I3139 Other pericardial effusion (noninflammatory): Secondary | ICD-10-CM | POA: Diagnosis not present

## 2024-06-12 DIAGNOSIS — Z9889 Other specified postprocedural states: Secondary | ICD-10-CM

## 2024-06-12 DIAGNOSIS — I5023 Acute on chronic systolic (congestive) heart failure: Secondary | ICD-10-CM | POA: Diagnosis not present

## 2024-06-12 LAB — BASIC METABOLIC PANEL WITH GFR
Anion gap: 9 (ref 5–15)
BUN: 21 mg/dL (ref 8–23)
CO2: 28 mmol/L (ref 22–32)
Calcium: 8.7 mg/dL — ABNORMAL LOW (ref 8.9–10.3)
Chloride: 101 mmol/L (ref 98–111)
Creatinine, Ser: 1.26 mg/dL — ABNORMAL HIGH (ref 0.44–1.00)
GFR, Estimated: 43 mL/min — ABNORMAL LOW (ref >60)
Glucose, Bld: 100 mg/dL — ABNORMAL HIGH (ref 70–99)
Potassium: 3.4 mmol/L — ABNORMAL LOW (ref 3.5–5.1)
Sodium: 138 mmol/L (ref 135–145)

## 2024-06-12 LAB — BRAIN NATRIURETIC PEPTIDE: B Natriuretic Peptide: 300.4 pg/mL — ABNORMAL HIGH (ref 0.0–100.0)

## 2024-06-12 LAB — ECHOCARDIOGRAM COMPLETE
Height: 64 in
Weight: 2169.6 [oz_av]

## 2024-06-12 MED ORDER — IRON SUCROSE 20 MG/ML IV SOLN
200.0000 mg | Freq: Once | INTRAVENOUS | Status: AC
Start: 2024-06-12 — End: 2024-06-12
  Administered 2024-06-12 (×2): 200 mg via INTRAVENOUS
  Filled 2024-06-12: qty 10

## 2024-06-12 MED ORDER — POTASSIUM CHLORIDE CRYS ER 20 MEQ PO TBCR: 20.0000 meq | EXTENDED_RELEASE_TABLET | Freq: Two times a day (BID) | ORAL | 6 refills | Status: DC

## 2024-06-12 MED ORDER — TORSEMIDE 20 MG PO TABS: ORAL_TABLET | ORAL | 3 refills | Status: DC

## 2024-06-12 MED ORDER — SODIUM CHLORIDE 0.9 % IV BOLUS
250.0000 mL | Freq: Once | INTRAVENOUS | Status: AC
  Administered 2024-06-12 (×2): 250 mL via INTRAVENOUS
  Filled 2024-06-12: qty 250

## 2024-06-12 NOTE — Patient Instructions (Signed)
 Medication Changes:  INCREASE Torsemide  to 40 mg (2 tabs) in AM and 20 mg (1 tab) in PM  Take Potassium 20 meq (1 tab) DAILY, unless we tell you to increase to Twice daily   Lab Work:  Labs done today, your results will be available in MyChart, we will contact you for abnormal readings.   Special Instructions // Education:  Do the following things EVERYDAY: Weigh yourself in the morning before breakfast. Write it down and keep it in a log. Take your medicines as prescribed Eat low salt foods--Limit salt (sodium) to 2000 mg per day.  Stay as active as you can everyday Limit all fluids for the day to less than 2 liters   Follow-Up in: 2 weeks   At the [facility], you and your health needs are our priority. We have a designated team specialized in the treatment of Heart Failure. This Care Team includes your primary Heart Failure Specialized Cardiologist (physician), Advanced Practice Providers (APPs- Physician Assistants and Nurse Practitioners), and Pharmacist who all work together to provide you with the care you need, when you need it.   You may see any of the following providers on your designated Care Team at your next follow up:  Dr. Toribio Fuel Dr. Ezra Shuck Dr. Ria Commander Dr. Odis Brownie Greig Mosses, NP Caffie Shed, [place] [facility] [place], [place] Beckey Coe, NP [place] Lee, NP Tinnie Redman, PharmD   Please be sure to bring in all your medications bottles to every appointment.   Need to Contact Us :  If you have any questions or concerns before your next appointment please send us  a message through [place] or call our office at [phone].    TO LEAVE A MESSAGE FOR THE NURSE SELECT OPTION 2, PLEASE LEAVE A MESSAGE INCLUDING: YOUR NAME DATE OF BIRTH CALL BACK NUMBER REASON FOR CALL**this is important as we prioritize the call backs  YOU WILL RECEIVE A CALL BACK THE SAME DAY AS LONG AS YOU CALL BEFORE 4:00 PM

## 2024-06-12 NOTE — Progress Notes (Incomplete)
 ADVANCED HF CLINIC NOTE  Primary Care: Dawn Nottingham, MD Primary Cardiologist: Gordy Bergamo, MD HF Cardiologist: Dr. Cherrie  Reason for visit: f/u for HF   HPI: [name] is an 83 y.o.SABRA female with HTN and PAF, MR and systolic heart failure.    Admitted 2/24 for further MR workup. She underwent L/RHC which showed [name] 2/25 with no CAD. RA 8, PA 41/17 (14), Fick CO/CI 7.76/4.41, PAPi 3, LVEDP 11. Following her heart cath she went into a fib RVR, asymptomatic but new. TEE confirmed severe MR from posterior leaflet prolapse/flail and mod TR with EF 60%. Was initially set up for DCCV but chemically converted and was able to transition to PO amiodarone . Discharged with plans for MVR the following month with Dr. Maryjane.    Admitted 02/08/24 for MVR, MAZE and LAA clipping. Intra-Op TEE with EF 55-60% and severe MR. Post-op course complicated by persistent a fib RVR and vasoplegic shock requiring vasopressors. Able to extubate the following day. Pressors weaned off.  AHF team consulted after echo showed new drop in EF to 35-40% with persistent a fib and volume overload. Diuresed, able to wean drips and GDMT titrated but limited by soft BP. Underwent L thoracentesis 02/27/24.  Discharged to SNF, weight 145 lbs.  Echo 03/21/24: EF 55-60%, LV no RWMA, RV normal, trivial MR, mod TR.   She presents back today for 2 wk f/u. At last visit, she was felt to be mildly volume overloaded. Torsemide  was increased from 40 mg bid to 60 mg qam/40 mg qpm. Also instructed to take a dose of metolazone  2.5 mg x 1. Since last visit, her wt is down 9 lb from 135>>126 lb. ReDs 24%.   Functional status improving, NYHA Class II. No chest pain. She denies any palpitations/symptoms of afib. No abnormal bleeding w/ Eliquis .   She is here for f/u. Continues to improve. Going to CR. Can do all ADLs without problem. No CP or SOB   Echo EF 55-60% RV normal severe BAE mild MR mod TR   Wt Readings from Last 3 Encounters:   06/12/24 62.2 kg (137 lb 3.2 oz)  06/12/24 61.5 kg (135 lb 9.6 oz)  06/07/24 63.1 kg (139 lb 3.2 oz)      Cardiac Studies - Echo 03/21/24: EF 55-60%, LV no RWMA, RV normal, trivial MR, mod TR.  - Echo (02/14/24): EF 35-40%, LV with GHK, RV normal, LA mod dilated, RA severely dilated, trivial MR, mod TR - Intra-Op TEE 4/25: EF 55-60%, severe MR - TEE 2/25: LVEF 60-65%, no LAA thrombus, severe MR from posterior leaflet prolapse/flail and moderate TR - R/LHC 2/25: no CAD, RA 8, PA 41/17 (30), PCWP 14, CO/CI (Fick) 7.76/4.41  Past Medical History:  Diagnosis Date   Acid reflux    Anemia    Arthritis    Atrial fibrillation (HCC)    Cataract    Diverticulosis of colon    Endometrial cancer (HCC)    1A grade endometrial ca   Essential hypertension 01/24/2009   Qualifier: Diagnosis of  By: Nelson-Smith CMA (AAMA), Dottie     Hx of radiation therapy 04/24/08-05/29/08& 8/12/,8/19,&06/27/08   external beam  through 7/09 ,then intracavity in 06/2008   Hypertension    Mitral valve prolapse    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   Tricuspid regurgitation    Current Outpatient Medications  Medication Sig Dispense Refill   acetaminophen  (TYLENOL ) 500 MG tablet Take 1-2 tablets (500-1,000 mg total) by mouth every 6 (six) hours  as needed for mild pain (pain score 1-3) or fever. (Patient taking differently: Take 500-1,000 mg by mouth as needed for mild pain (pain score 1-3) or fever.)     amiodarone  (PACERONE ) 200 MG tablet Take 1 tablet (200 mg total) by mouth daily. 90 tablet 3   amoxicillin (AMOXIL) 500 MG capsule Take 500 mg by mouth as needed. Patient takes 4 capsule before dental procedures     apixaban  (ELIQUIS ) 2.5 MG TABS tablet Take 1 tablet (2.5 mg total) by mouth 2 (two) times daily. 60 tablet 11   atorvastatin  (LIPITOR) 10 MG tablet Take 1 tablet (10 mg total) by mouth daily. 90 tablet 3   b complex vitamins tablet Take 1 tablet by mouth daily.       CALCIUM  PO Take 650 mg by mouth  daily.     Cholecalciferol (VITAMIN D3) 125 MCG (5000 UT) CAPS Take 5,000 Units by mouth daily.     denosumab  (PROLIA ) 60 MG/ML SOSY injection Inject 60 mg into the skin every 6 (six) months.     gabapentin (NEURONTIN) 100 MG capsule Take 100 mg by mouth 2 (two) times daily.     magnesium  gluconate (MAGONATE) 500 (27 Mg) MG TABS tablet Take 1 tablet (500 mg total) by mouth daily.     Multiple Vitamins-Minerals (MULTIVITAMINS THER. W/MINERALS) TABS Take 1 tablet by mouth daily.       pantoprazole  (PROTONIX ) 40 MG tablet Take 40 mg by mouth daily.     Potassium Chloride  ER 20 MEQ TBCR Take 1 tablet by mouth daily.     Torsemide  40 MG TABS Take by mouth. (Patient taking differently: Take 40 mg by mouth 2 (two) times daily.)     witch hazel-glycerin  (TUCKS) pad Apply topically as needed for itching.     Zinc 50 MG TABS Take 50 mg by mouth daily.     melatonin 3 MG TABS tablet Take 1 tablet (3 mg total) by mouth at bedtime. (Patient not taking: Reported on 05/28/2024)     Misc Natural Products (GLUCOSAMINE CHOND MSM FORMULA) TABS Take 1 tablet by mouth as needed. (Patient not taking: Reported on 05/28/2024)     neomycin-polymyxin b-dexamethasone  (MAXITROL) 3.5-10000-0.1 SUSP INSTILL 1 DROP 4 TIMES A DAY IN LEFT EYE FOR 1 WEEK THEN TWICE A DAY FOR 2ND WEEK (Patient not taking: Reported on 05/22/2024)     phenol (CHLORASEPTIC) 1.4 % LIQD Use as directed 1 spray in the mouth or throat as needed for throat irritation / pain. (Patient not taking: Reported on 05/22/2024)     potassium chloride  SA (KLOR-CON  M) 20 MEQ tablet Take 2 tablets (40 mEq total) by mouth daily. Take extra 2 tabs when you take metolazone      No current facility-administered medications for this encounter.   Allergies  Allergen Reactions   Crestor [Rosuvastatin] Other (See Comments)    Leg cramps   Glycopyrrolate      Other reaction(s): extreme dry mouth   Codeine Other (See Comments)    Hyper   Social History   Socioeconomic  History   Marital status: Married    Spouse name: Not on file   Number of children: 1   Years of education: Not on file   Highest education level: Not on file  Occupational History   Occupation: Environmental health practitioner  Tobacco Use   Smoking status: Never   Smokeless tobacco: Never  Vaping Use   Vaping status: Never Used  Substance and Sexual Activity   Alcohol use: Yes  Comment: rarely   Drug use: No   Sexual activity: Never  Other Topics Concern   Not on file  Social History Narrative   Not on file   Social Drivers of Health   Financial Resource Strain: Not on file  Food Insecurity: No Food Insecurity (02/08/2024)   Hunger Vital Sign    Worried About Running Out of Food in the Last Year: Never true    Ran Out of Food in the Last Year: Never true  Transportation Needs: No Transportation Needs (02/08/2024)   PRAPARE - Administrator, Civil Service (Medical): No    Lack of Transportation (Non-Medical): No  Physical Activity: Not on file  Stress: Not on file  Social Connections: Socially Integrated (02/09/2024)   Social Connection and Isolation Panel    Frequency of Communication with Friends and Family: More than three times a week    Frequency of Social Gatherings with Friends and Family: More than three times a week    Attends Religious Services: More than 4 times per year    Active Member of [employer] or Organizations: Yes    Attends Banker Meetings: More than 4 times per year    Marital Status: Married  Catering manager Violence: Not At Risk (02/08/2024)   Humiliation, Afraid, Rape, and Kick questionnaire    Fear of Current or Ex-Partner: No    Emotionally Abused: No    Physically Abused: No    Sexually Abused: No   Family History  Problem Relation Age of Onset   Diabetes type II Mother    Breast cancer Mother    Leukemia Father    Diabetes type II Sister    Breast cancer Sister    Cancer Brother 69       throat 10/2020   Pancreatic  cancer Maternal Aunt        x2   Diabetes Maternal Grandmother    Diabetes Maternal Grandfather    Colon cancer Neg Hx    Stomach cancer Neg Hx    Esophageal cancer Neg Hx    Wt Readings from Last 3 Encounters:  06/12/24 62.2 kg (137 lb 3.2 oz)  06/12/24 61.5 kg (135 lb 9.6 oz)  06/07/24 63.1 kg (139 lb 3.2 oz)   BP (!) 118/56   Pulse 68   Ht 5' 4 (1.626 m)   Wt 62.2 kg (137 lb 3.2 oz)   SpO2 92%   BMI 23.55 kg/m   PHYSICAL EXAM: General:  Well appearing, elderly. No respiratory difficulty HEENT: normal Neck: supple. JVP 10. Carotids 2+ bilat; no bruits. No lymphadenopathy or thryomegaly appreciated. Cor: PMI nondisplaced. Regular rate & rhythm. 2/6 TR 2  Lungs: clear Abdomen: soft, nontender, nondistended. No hepatosplenomegaly. No bruits or masses. Good bowel sounds. Extremities: no cyanosis, clubbing, rash, 2-3+ edema Neuro: alert & orientedx3, cranial nerves grossly intact. moves all 4 extremities w/o difficulty. Affect pleasant    ECG : not performed today   ReDs reading: 24%, normal   ASSESSMENT & PLAN: Chronic systolic heart failure, NiCM - R/LHC 2/25 with no CAD. RA 8, PA 41/17 (14), Fick CO/CI 7.76/4.41, PAPi 3, LVEDP 11 - Intra-op TEE (at time of MVR) 02/08/24 with EF 55-60%  - Echo 02/14/24: EF 35-40%, LV with GHK, RV normal, LA mod dilated, RA severely dilated, trivial MR, mod TR - Echo 03/21/24: EF 55-60%, LV no RWMA, RV normal, trivial MR, mod TR.  - Suspect previous drop in EF was 2/2 persistent AF RVR.  -  Functional status improving, now NYHA Class II. Wt down 9 lb from last vist. ReDs 24%. Euvolemic on exam  - Continue torsemide  40 mg daily. Reviewed labs from PCP office yesterday, SCr 1.4, K 4.6   - Continue spiro 25 mg daily - Off SGLT2i with UTIs - Continue compression hose.     Atrial fibrillation  - S/p Maze and LAA clipping 4/25 - Failed DCCV X 2 02/16/24 - RRR on exam  - Stop Amio (recent labs 6/13 showed elevated TSH (6.0) and Free T4 (16).  AST and ALT also mildly elevated, 43/47). She has return f/u w/ PCP next wk. I instructed her to discuss f/u on TFTs and HFTs  - she will notify us  if she has any symptoms of afib (symptomatic w/ previous episodes)  - Continue Eliquis  2.5 mg bid (reduced dose given age and SCr). Denies abnormal bleeding. Recent H/H stable   Severe MR - s/p MVR - Trivial MR on echo 02/14/24 - Echo 5/21 with trivial MR - no appreciable murmur heard on exam today   4. Venous Insuffiencey  - symptomatic, legs ache. Bilateral  + varicose veins on exam  - continue daytime compression stockings + elevation when able - will refer to [facility]     Keep scheduled f/u w/ Dr. Cherrie in August.    Toribio Feleshia Zundel PA-C 06/12/24

## 2024-06-12 NOTE — Progress Notes (Signed)
 Diagnosis: Iron  Deficiency Anemia  Provider:  Mannam, Praveen MD  Procedure: IV Push  IV Type: Peripheral, IV Location: L Antecubital  Venofer  (Iron  Sucrose), Dose: 200 mg  IV push administered with NS running for patient comfort.  Post Infusion IV Care: Observation period completed and Peripheral IV Discontinued  Discharge: Condition: Good, Destination: Home . AVS Provided  Performed by:  Rocky FORBES Sar, RN

## 2024-06-13 ENCOUNTER — Encounter (HOSPITAL_COMMUNITY)
Admission: RE | Admit: 2024-06-13 | Discharge: 2024-06-13 | Disposition: A | Discharge status: Home / Self Care | Source: Ambulatory Visit | Attending: Cardiology | Admitting: Cardiology

## 2024-06-13 ENCOUNTER — Telehealth: Payer: Self-pay | Admitting: Cardiology

## 2024-06-13 DIAGNOSIS — Z9889 Other specified postprocedural states: Secondary | ICD-10-CM

## 2024-06-13 DIAGNOSIS — Z48812 Encounter for surgical aftercare following surgery on the circulatory system: Secondary | ICD-10-CM | POA: Diagnosis not present

## 2024-06-13 NOTE — Telephone Encounter (Signed)
 I discussed with the patient today, patient has been taken off of work until July 12, 2024.  However patient was thinking of going back to work.  I reviewed her chart, patient has been in and out of physician's office, recently her diuretics have been increased, hence advised the patient not to return to work as it could prove disruptive to both her care and also in the office settings as she can have acute exacerbation of heart failure or significant leg edema necessitating again taken her off of work.  For now keep her out of work until July 12, 2024 and decide upon that time how she is doing and then she may return to work and paperwork has already been performed for her to return to work for 5 hours a day.  With regard to leg edema, her diuretics have been increased by Dr. Cherrie.

## 2024-06-13 NOTE — Progress Notes (Signed)
 Reviewed home exercise guidelines with [name] including endpoints, temperature precautions, target heart rate and rate of perceived exertion. She states she's walking more at home as her mode of home exercise. She's walking her dogs down her driveway and up the street increasing her distance daily. Her legs are still wobbly/ shaky. Her goal is to increase leg strength to help with balance/ steadiness. [name] voices understanding of instructions given.  Arnoldo CHRISTELLA Gal, MS, ACSM CEP

## 2024-06-14 ENCOUNTER — Ambulatory Visit (INDEPENDENT_AMBULATORY_CARE_PROVIDER_SITE_OTHER)

## 2024-06-14 VITALS — BP 98/61 | HR 64 | Temp 98.4°F | Resp 14 | Ht 64.0 in | Wt 136.4 lb

## 2024-06-14 DIAGNOSIS — D509 Iron deficiency anemia, unspecified: Secondary | ICD-10-CM

## 2024-06-14 MED ORDER — SODIUM CHLORIDE 0.9 % IV BOLUS
250.0000 mL | Freq: Once | INTRAVENOUS | Status: AC
  Administered 2024-06-14: 250 mL via INTRAVENOUS
  Filled 2024-06-14: qty 250

## 2024-06-14 MED ORDER — IRON SUCROSE 20 MG/ML IV SOLN
200.0000 mg | Freq: Once | INTRAVENOUS | Status: AC
  Administered 2024-06-14: 200 mg via INTRAVENOUS
  Filled 2024-06-14: qty 10

## 2024-06-14 NOTE — Telephone Encounter (Signed)
 Dr Ladona spoke with patient regarding paperwork

## 2024-06-14 NOTE — Progress Notes (Signed)
 Diagnosis: Iron  Deficiency Anemia  Provider:  Praveen Mannam MD  Procedure: IV Push  IV Type: Peripheral, IV Location: L Antecubital  Venofer  (Iron  Sucrose), Dose: 200 mg  Post Infusion IV Care: Observation period completed and Peripheral IV Discontinued  Discharge: Condition: Good, Destination: Home . AVS Declined  Performed by:  Leita FORBES Miles, LPN

## 2024-06-15 ENCOUNTER — Telehealth: Payer: Self-pay | Admitting: Cardiology

## 2024-06-15 ENCOUNTER — Encounter (HOSPITAL_COMMUNITY)

## 2024-06-15 NOTE — Telephone Encounter (Signed)
 We have already done this paper work in the past and all she needs to do is keep that, is that not the right papers with right date or dates need to be amended. I am very confused, she can fill all the dates she has the previous form from previous and put the date changes so I can sign off or one of you can and I will sign off

## 2024-06-15 NOTE — Telephone Encounter (Signed)
 I spoke to this patient this morning.  She seems to be somewhat confused about the most recent form.  Please call her and explain to her why Dr. Ladona is not filling out this last form.  I still have the copy if you need it for anything.

## 2024-06-18 ENCOUNTER — Encounter (HOSPITAL_COMMUNITY)
Admission: RE | Admit: 2024-06-18 | Discharge: 2024-06-18 | Disposition: A | Discharge status: Home / Self Care | Source: Ambulatory Visit | Attending: Cardiology | Admitting: Cardiology

## 2024-06-18 DIAGNOSIS — Z48812 Encounter for surgical aftercare following surgery on the circulatory system: Secondary | ICD-10-CM | POA: Diagnosis not present

## 2024-06-18 DIAGNOSIS — Z9889 Other specified postprocedural states: Secondary | ICD-10-CM | POA: Diagnosis not present

## 2024-06-18 NOTE — Telephone Encounter (Signed)
 I spoke with patient.  She would like to return to work on 9/15 for 5 hours per day.  She will see Dr Ladona on October 20 as planned and will discuss return to full time work at this appointment.

## 2024-06-19 ENCOUNTER — Ambulatory Visit (INDEPENDENT_AMBULATORY_CARE_PROVIDER_SITE_OTHER)

## 2024-06-19 VITALS — BP 101/58 | HR 65 | Temp 98.1°F | Resp 24 | Ht 64.0 in | Wt 128.2 lb

## 2024-06-19 DIAGNOSIS — D509 Iron deficiency anemia, unspecified: Secondary | ICD-10-CM | POA: Diagnosis not present

## 2024-06-19 MED ORDER — IRON SUCROSE 20 MG/ML IV SOLN
200.0000 mg | Freq: Once | INTRAVENOUS | Status: AC
  Administered 2024-06-19: 200 mg via INTRAVENOUS
  Filled 2024-06-19: qty 10

## 2024-06-19 MED ORDER — SODIUM CHLORIDE 0.9 % IV BOLUS
250.0000 mL | Freq: Once | INTRAVENOUS | Status: AC
  Administered 2024-06-19: 250 mL via INTRAVENOUS
  Filled 2024-06-19: qty 250

## 2024-06-19 NOTE — Progress Notes (Signed)
 Diagnosis: Iron Deficiency Anemia  Provider:  Chilton Greathouse MD  Procedure: IV Push  IV Type: Peripheral, IV Location: L Antecubital  Venofer (Iron Sucrose), Dose: 200 mg  Post Infusion IV Care: Observation period completed and Peripheral IV Discontinued  Discharge: Condition: Good, Destination: Home . AVS Provided  Performed by:  Loney Hering, LPN

## 2024-06-20 ENCOUNTER — Encounter (HOSPITAL_COMMUNITY)
Admission: RE | Admit: 2024-06-20 | Discharge: 2024-06-20 | Disposition: A | Discharge status: Home / Self Care | Source: Ambulatory Visit | Attending: Cardiology

## 2024-06-20 DIAGNOSIS — Z48812 Encounter for surgical aftercare following surgery on the circulatory system: Secondary | ICD-10-CM | POA: Diagnosis not present

## 2024-06-20 DIAGNOSIS — Z9889 Other specified postprocedural states: Secondary | ICD-10-CM | POA: Diagnosis not present

## 2024-06-20 DIAGNOSIS — M810 Age-related osteoporosis without current pathological fracture: Secondary | ICD-10-CM | POA: Diagnosis not present

## 2024-06-20 NOTE — Telephone Encounter (Signed)
 Attending Physician's Statement faxed to [employer] and scanned into chart.

## 2024-06-22 ENCOUNTER — Encounter (HOSPITAL_COMMUNITY)
Admission: RE | Admit: 2024-06-22 | Discharge: 2024-06-22 | Disposition: A | Discharge status: Home / Self Care | Source: Ambulatory Visit | Attending: Cardiology | Admitting: Cardiology

## 2024-06-22 DIAGNOSIS — Z9889 Other specified postprocedural states: Secondary | ICD-10-CM | POA: Diagnosis not present

## 2024-06-22 DIAGNOSIS — Z48812 Encounter for surgical aftercare following surgery on the circulatory system: Secondary | ICD-10-CM | POA: Diagnosis not present

## 2024-06-24 ENCOUNTER — Ambulatory Visit (HOSPITAL_COMMUNITY): Payer: Self-pay | Admitting: Internal Medicine

## 2024-06-25 ENCOUNTER — Telehealth (HOSPITAL_COMMUNITY): Payer: Self-pay

## 2024-06-25 ENCOUNTER — Encounter (HOSPITAL_COMMUNITY)
Admission: RE | Admit: 2024-06-25 | Discharge: 2024-06-25 | Disposition: A | Discharge status: Home / Self Care | Source: Ambulatory Visit | Attending: Cardiology | Admitting: Cardiology

## 2024-06-25 DIAGNOSIS — Z9889 Other specified postprocedural states: Secondary | ICD-10-CM | POA: Diagnosis not present

## 2024-06-25 DIAGNOSIS — Z48812 Encounter for surgical aftercare following surgery on the circulatory system: Secondary | ICD-10-CM | POA: Diagnosis not present

## 2024-06-25 NOTE — Telephone Encounter (Signed)
 Called to confirm/remind patient of their appointment at [facility] on 06/26/24.   Appointment:   [x] Confirmed  [] Left mess   [] No answer/No voice mail  [] VM Full/unable to leave message  [] Phone not in service  Patient reminded to bring all medications and/or complete list.  Confirmed patient has transportation. Gave directions, instructed to utilize valet parking.

## 2024-06-26 ENCOUNTER — Ambulatory Visit (HOSPITAL_COMMUNITY)
Admission: RE | Admit: 2024-06-26 | Discharge: 2024-06-26 | Disposition: A | Discharge status: Home / Self Care | Source: Ambulatory Visit | Attending: Family Medicine | Admitting: Family Medicine

## 2024-06-26 ENCOUNTER — Ambulatory Visit (HOSPITAL_COMMUNITY): Payer: Self-pay | Admitting: Family Medicine

## 2024-06-26 ENCOUNTER — Encounter (HOSPITAL_COMMUNITY): Payer: Self-pay

## 2024-06-26 ENCOUNTER — Encounter: Payer: Self-pay | Admitting: Cardiology

## 2024-06-26 VITALS — BP 98/62 | HR 66 | Ht 64.0 in | Wt 128.4 lb

## 2024-06-26 DIAGNOSIS — I110 Hypertensive heart disease with heart failure: Secondary | ICD-10-CM | POA: Insufficient documentation

## 2024-06-26 DIAGNOSIS — I340 Nonrheumatic mitral (valve) insufficiency: Secondary | ICD-10-CM

## 2024-06-26 DIAGNOSIS — I5022 Chronic systolic (congestive) heart failure: Secondary | ICD-10-CM | POA: Insufficient documentation

## 2024-06-26 DIAGNOSIS — Z7901 Long term (current) use of anticoagulants: Secondary | ICD-10-CM | POA: Insufficient documentation

## 2024-06-26 DIAGNOSIS — I4891 Unspecified atrial fibrillation: Secondary | ICD-10-CM | POA: Diagnosis not present

## 2024-06-26 DIAGNOSIS — I8393 Asymptomatic varicose veins of bilateral lower extremities: Secondary | ICD-10-CM | POA: Diagnosis not present

## 2024-06-26 DIAGNOSIS — I872 Venous insufficiency (chronic) (peripheral): Secondary | ICD-10-CM

## 2024-06-26 DIAGNOSIS — Z79899 Other long term (current) drug therapy: Secondary | ICD-10-CM | POA: Insufficient documentation

## 2024-06-26 LAB — BASIC METABOLIC PANEL WITH GFR
Anion gap: 11 (ref 5–15)
BUN: 20 mg/dL (ref 8–23)
CO2: 23 mmol/L (ref 22–32)
Calcium: 7.3 mg/dL — ABNORMAL LOW (ref 8.9–10.3)
Chloride: 100 mmol/L (ref 98–111)
Creatinine, Ser: 1.42 mg/dL — ABNORMAL HIGH (ref 0.44–1.00)
GFR, Estimated: 37 mL/min — ABNORMAL LOW (ref >60)
Glucose, Bld: 109 mg/dL — ABNORMAL HIGH (ref 70–99)
Potassium: 4.5 mmol/L (ref 3.5–5.1)
Sodium: 134 mmol/L — ABNORMAL LOW (ref 135–145)

## 2024-06-26 LAB — BRAIN NATRIURETIC PEPTIDE: B Natriuretic Peptide: 295.1 pg/mL — ABNORMAL HIGH (ref 0.0–100.0)

## 2024-06-26 NOTE — Patient Instructions (Addendum)
 Thank you for coming in today  If you had labs drawn today, any labs that are abnormal the clinic will call you No news is good news  Medications: No changes  Follow up appointments:  Your physician recommends that you schedule a follow-up appointment in:  4 months With Dr. Cherrie Please call our office to schedule the follow-up appointment in October 2025 and December 2025.   Do the following things EVERYDAY: Weigh yourself in the morning before breakfast. Write it down and keep it in a log. Take your medicines as prescribed Eat low salt foods--Limit salt (sodium) to 2000 mg per day.  Stay as active as you can everyday Limit all fluids for the day to less than 2 liters   At the [facility], you and your health needs are our priority. As part of our continuing mission to provide you with exceptional heart care, we have created designated Provider Care Teams. These Care Teams include your primary Cardiologist (physician) and Advanced Practice Providers (APPs- Physician Assistants and Nurse Practitioners) who all work together to provide you with the care you need, when you need it.   You may see any of the following providers on your designated Care Team at your next follow up: Dr Toribio Cherrie Dr Ezra Shuck Dr. Ria Gardenia Greig Lenetta, NP Caffie Shed, [place] [facility] [place], [place] Beckey Coe, NP Tinnie Redman, PharmD   Please be sure to bring in all your medications bottles to every appointment.    Thank you for choosing [facility]  If you have any questions or concerns before your next appointment please send us  a message through [place] or call our office at [phone].    TO LEAVE A MESSAGE FOR THE NURSE SELECT OPTION 2, PLEASE LEAVE A MESSAGE INCLUDING: YOUR NAME DATE OF BIRTH CALL BACK NUMBER REASON FOR CALL**this is important as we prioritize the call backs  YOU WILL RECEIVE A  CALL BACK THE SAME DAY AS LONG AS YOU CALL BEFORE 4:00 PM

## 2024-06-26 NOTE — Progress Notes (Signed)
 [facility] NOTE  Primary Care: Clarice Nottingham, MD Primary Cardiologist: Gordy Bergamo, MD HF Cardiologist: Dr. Cherrie  HPI: [name] is an 83 y.o. female with HTN and PAF, MR and systolic heart failure.    Admitted 2/24 for further MR workup. She underwent L/RHC which showed [facility] 2/25 with no CAD. RA 8, PA 41/17 (14), Fick CO/CI 7.76/4.41, PAPi 3, LVEDP 11. Following her heart cath she went into a fib RVR, asymptomatic but new. TEE confirmed severe MR from posterior leaflet prolapse/flail and mod TR with EF 60%. Was initially set up for DCCV but chemically converted and was able to transition to PO amiodarone . Discharged with plans for MVR the following month with Dr. Maryjane.    Admitted 02/08/24 for MVR, MAZE and LAA clipping. Intra-Op TEE with EF 55-60% and severe MR. Post-op course complicated by persistent a fib RVR and vasoplegic shock requiring vasopressors. Able to extubate the following day. Pressors weaned off.  AHF team consulted after echo showed new drop in EF to 35-40% with persistent a fib and volume overload. Diuresed, able to wean drips and GDMT titrated but limited by soft BP. Underwent L thoracentesis 02/27/24.  Discharged to SNF, weight 145 lbs.  Echo 03/21/24: EF 55-60%, LV no RWMA, RV normal, trivial MR, mod TR.   Echo 8/25 EF 55-60% RV normal severe BAE mild MR mod TR   Today she returns for HF follow up. Overall feeling fine. Had a couple days of dizziness, BP was low at CR and she admits to not drinking enough water. Doing well with cardiac rehab, no undue dyspnea. Swelling much improved. Denies palpitations, abnormal bleeding, CP, or PND/Orthopnea. Appetite ok. Weight at home 124 pounds. Taking all medications. 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   Cardiac Studies - Echo 8/25: EF 55-60% RV normal severe BAE mild MR mod TR   - Echo 03/21/24: EF 55-60%, LV no RWMA, RV normal, trivial MR, mod TR.   - Echo (02/14/24): EF  35-40%, LV with GHK, RV normal, LA mod dilated, RA severely dilated, trivial MR, mod TR  - Intra-Op TEE 4/25: EF 55-60%, severe MR  - TEE 2/25: LVEF 60-65%, no LAA thrombus, severe MR from posterior leaflet prolapse/flail and moderate TR  - R/LHC 2/25: no CAD, RA 8, PA 41/17 (30), PCWP 14, CO/CI (Fick) 7.76/4.41  Past Medical History:  Diagnosis Date   Acid reflux    Anemia    Arthritis    Atrial fibrillation (HCC)    Cataract    Diverticulosis of colon    Endometrial cancer (HCC)    1A grade endometrial ca   Essential hypertension 01/24/2009   Qualifier: Diagnosis of  By: Nelson-Smith CMA (AAMA), Dottie     Hx of radiation therapy 04/24/08-05/29/08& 8/12/,8/19,&06/27/08   external beam  through 7/09 ,then intracavity in 06/2008   Hypertension    Mitral valve prolapse    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   Tricuspid regurgitation    Current Outpatient Medications  Medication Sig Dispense Refill   acetaminophen  (TYLENOL ) 500 MG tablet Take 1-2 tablets (500-1,000 mg total) by mouth every 6 (six) hours as needed for mild pain (pain score 1-3) or fever. (Patient taking differently: Take 500-1,000 mg by mouth as needed for mild pain (pain score 1-3) or fever.)     amiodarone  (PACERONE ) 200 MG tablet Take 1 tablet (200 mg total) by mouth daily. 90 tablet 3   amoxicillin (AMOXIL) 500  MG capsule Take 500 mg by mouth as needed. Patient takes 4 capsule before dental procedures     apixaban  (ELIQUIS ) 2.5 MG TABS tablet Take 1 tablet (2.5 mg total) by mouth 2 (two) times daily. 60 tablet 11   atorvastatin  (LIPITOR) 10 MG tablet Take 1 tablet (10 mg total) by mouth daily. 90 tablet 3   b complex vitamins tablet Take 1 tablet by mouth daily.       CALCIUM  PO Take 650 mg by mouth daily.     Cholecalciferol (VITAMIN D3) 125 MCG (5000 UT) CAPS Take 5,000 Units by mouth daily.     denosumab  (PROLIA ) 60 MG/ML SOSY injection Inject 60 mg into the skin every 6 (six) months.     gabapentin  (NEURONTIN) 100 MG capsule Take 100 mg by mouth 2 (two) times daily.     Misc Natural Products (GLUCOSAMINE CHOND MSM FORMULA) TABS Take 1 tablet by mouth as needed.     Multiple Vitamins-Minerals (MULTIVITAMINS THER. W/MINERALS) TABS Take 1 tablet by mouth daily.       pantoprazole  (PROTONIX ) 40 MG tablet Take 40 mg by mouth daily.     potassium chloride  SA (KLOR-CON  M) 20 MEQ tablet Take 1 tablet (20 mEq total) by mouth 2 (two) times daily. 60 tablet 6   torsemide  (DEMADEX ) 20 MG tablet Take 2 tablets (40 mg total) by mouth in the morning AND 1 tablet (20 mg total) every evening. 90 tablet 3   witch hazel-glycerin  (TUCKS) pad Apply topically as needed for itching.     Zinc 50 MG TABS Take 50 mg by mouth daily.     magnesium  gluconate (MAGONATE) 500 (27 Mg) MG TABS tablet Take 1 tablet (500 mg total) by mouth daily.     No current facility-administered medications for this encounter.   Allergies  Allergen Reactions   Crestor [Rosuvastatin] Other (See Comments)    Leg cramps   Glycopyrrolate      Other reaction(s): extreme dry mouth   Codeine Other (See Comments)    Hyper   Social History   Socioeconomic History   Marital status: Married    Spouse name: Not on file   Number of children: 1   Years of education: Not on file   Highest education level: Not on file  Occupational History   Occupation: Environmental health practitioner  Tobacco Use   Smoking status: Never   Smokeless tobacco: Never  Vaping Use   Vaping status: Never Used  Substance and Sexual Activity   Alcohol use: Yes    Comment: rarely   Drug use: No   Sexual activity: Never  Other Topics Concern   Not on file  Social History Narrative   Not on file   Social Drivers of Health   Financial Resource Strain: Not on file  Food Insecurity: No Food Insecurity (02/08/2024)   Hunger Vital Sign    Worried About Running Out of Food in the Last Year: Never true    Ran Out of Food in the Last Year: Never true  Transportation  Needs: No Transportation Needs (02/08/2024)   PRAPARE - Administrator, Civil Service (Medical): No    Lack of Transportation (Non-Medical): No  Physical Activity: Not on file  Stress: Not on file  Social Connections: Socially Integrated (02/09/2024)   Social Connection and Isolation Panel    Frequency of Communication with Friends and Family: More than three times a week    Frequency of Social Gatherings with Friends and Family:  More than three times a week    Attends Religious Services: More than 4 times per year    Active Member of Clubs or Organizations: Yes    Attends Banker Meetings: More than 4 times per year    Marital Status: Married  Catering manager Violence: Not At Risk (02/08/2024)   Humiliation, Afraid, Rape, and Kick questionnaire    Fear of Current or Ex-Partner: No    Emotionally Abused: No    Physically Abused: No    Sexually Abused: No   Family History  Problem Relation Age of Onset   Diabetes type II Mother    Breast cancer Mother    Leukemia Father    Diabetes type II Sister    Breast cancer Sister    Cancer Brother 60       throat 10/2020   Pancreatic cancer Maternal Aunt        x2   Diabetes Maternal Grandmother    Diabetes Maternal Grandfather    Colon cancer Neg Hx    Stomach cancer Neg Hx    Esophageal cancer Neg Hx    Wt Readings from Last 3 Encounters:  06/26/24 58.2 kg (128 lb 6.4 oz)  06/19/24 58.2 kg (128 lb 3.2 oz)  06/14/24 61.9 kg (136 lb 6.4 oz)   BP 98/62   Pulse 66   Ht 5' 4 (1.626 m)   Wt 58.2 kg (128 lb 6.4 oz)   SpO2 96%   BMI 22.04 kg/m   PHYSICAL EXAM: General:  NAD. No resp difficulty, walked into clinic, appears younger than stated age HEENT: Normal Neck: Supple. No JVD. Cor: Regular rate & rhythm. No rubs, gallops or murmurs. Lungs: Clear Abdomen: Soft, nontender, nondistended.  Extremities: No cyanosis, clubbing, rash, trace BLE edema, compression hose on  Neuro: Alert & oriented x 3, moves  all 4 extremities w/o difficulty. Affect pleasant.  ASSESSMENT & PLAN: Chronic Systolic Heart Failure, NiCM - R/LHC 2/25 with no CAD. RA 8, PA 41/17 (14), Fick CO/CI 7.76/4.41, PAPi 3, LVEDP 11 - Intra-op TEE (at time of MVR) 02/08/24 with EF 55-60%  - Echo 02/14/24: EF 35-40%, LV with GHK, RV normal, LA mod dilated, RA severely dilated, trivial MR, mod TR - Echo 03/21/24: EF 55-60%, LV no RWMA, RV normal, trivial MR, mod TR.  - Suspect previous drop in EF was 2/2 persistent AF RVR.  - Echo 8/25 EF 55-60% RV normal severe BAE mild MR mod TR  - Doing great! NYHA I-II, volume much better. - Continue compression hose - Continue torsemide  40 mg q am/20 mg q pm + 40 KCL daily. - off spiro, no BP room to add back today. - Off SGLT2i with UTIs - Labs today   Atrial fibrillation  - S/p Maze and LAA clipping 4/25 - Failed DCCV X 2 02/16/24 - Followed by AF clinic - Regular on exam today - Continue amiodarone  200 mg daily - Continue Eliquis  2.5 mg bid (reduced dose given age and SCr). Denies abnormal bleeding  Severe MR - s/p MVR - Trivial MR on echo 02/14/24 - Echo 5/21 with trivial MR - no murmur heard on exam today   4. Venous Insuffiencey  - symptomatic, legs ache. Bilateral  + varicose veins on exam  - Continue daytime compression stockings + elevation when able - Referred to [facility]   Doing well! Follow up in 6 months with Dr. Cherrie [facility] FNP-BC 06/26/24

## 2024-06-27 ENCOUNTER — Encounter (HOSPITAL_COMMUNITY)
Admission: RE | Admit: 2024-06-27 | Discharge: 2024-06-27 | Disposition: A | Discharge status: Home / Self Care | Source: Ambulatory Visit | Attending: Cardiology

## 2024-06-27 DIAGNOSIS — Z9889 Other specified postprocedural states: Secondary | ICD-10-CM | POA: Diagnosis not present

## 2024-06-27 DIAGNOSIS — Z48812 Encounter for surgical aftercare following surgery on the circulatory system: Secondary | ICD-10-CM | POA: Diagnosis not present

## 2024-06-29 ENCOUNTER — Encounter (HOSPITAL_COMMUNITY)
Admission: RE | Admit: 2024-06-29 | Discharge: 2024-06-29 | Disposition: A | Discharge status: Home / Self Care | Source: Ambulatory Visit | Attending: Cardiology | Admitting: Cardiology

## 2024-06-29 DIAGNOSIS — Z48812 Encounter for surgical aftercare following surgery on the circulatory system: Secondary | ICD-10-CM | POA: Diagnosis not present

## 2024-06-29 DIAGNOSIS — Z9889 Other specified postprocedural states: Secondary | ICD-10-CM | POA: Diagnosis not present

## 2024-07-04 ENCOUNTER — Encounter (HOSPITAL_COMMUNITY)
Admission: RE | Admit: 2024-07-04 | Discharge: 2024-07-04 | Disposition: A | Discharge status: Home / Self Care | Source: Ambulatory Visit | Attending: Cardiology | Admitting: Cardiology

## 2024-07-04 ENCOUNTER — Ambulatory Visit: Attending: Vascular Surgery | Admitting: Physician Assistant

## 2024-07-04 VITALS — BP 111/69 | HR 66 | Temp 97.7°F | Wt 128.8 lb

## 2024-07-04 DIAGNOSIS — I872 Venous insufficiency (chronic) (peripheral): Secondary | ICD-10-CM

## 2024-07-04 DIAGNOSIS — Z9889 Other specified postprocedural states: Secondary | ICD-10-CM | POA: Insufficient documentation

## 2024-07-04 NOTE — Progress Notes (Signed)
 Office Note     CC:  follow up Requesting Provider:  Marcine Caffie HERO, SHAUNNA*  HPI: [name] is a 83 y.o. (1941-07-15) female who presents for evaluation of bilateral lower extremity edema.  Her edema is largely asymptomatic.  Past medical history also significant for heart failure for which she is on a fluid pill.  She believes her heart failure is well-controlled.  She has no history of DVT.  In the past she has had a right shin wound that drained clear fluid however this resolved with leg elevation and compression.  She wears knee-high compression on a regular basis.  She also focuses on leg elevation when possible during the day.  She is on Eliquis  for atrial fibrillation.  She denies tobacco use.   Past Medical History:  Diagnosis Date   Acid reflux    Anemia    Arthritis    Atrial fibrillation (HCC)    Cataract    Diverticulosis of colon    Endometrial cancer (HCC)    1A grade endometrial ca   Essential hypertension 01/24/2009   Qualifier: Diagnosis of  By: Nelson-Smith CMA (AAMA), Dottie     Hx of radiation therapy 04/24/08-05/29/08& 8/12/,8/19,&06/27/08   external beam  through 7/09 ,then intracavity in 06/2008   Hypertension    Mitral valve prolapse    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   Tricuspid regurgitation     Past Surgical History:  Procedure Laterality Date   APPENDECTOMY     1967   arthroscopic knee right surgery     CLIPPING OF ATRIAL APPENDAGE N/A 02/08/2024   Procedure: CLIPPING, LEFT ATRIAL APPENDAGE USING A MEDTRONIC CLIP;  Surgeon: Maryjane Mt, MD;  Location: [facility] OR;  Service: Open Heart Surgery;  Laterality: N/A;   ECTOPIC PREGNANCY SURGERY  1967   IR THORACENTESIS ASP PLEURAL SPACE W/IMG GUIDE  02/27/2024   iron  infusion  12/2022   LAPAROSCOPIC HYSTERECTOMY     total   MAZE N/A 02/08/2024   Procedure: MAZE PROCEDURE;  Surgeon: Maryjane Mt, MD;  Location: [facility] OR;  Service: Open Heart Surgery;  Laterality: N/A;   MITRAL VALVE REPAIR N/A  02/08/2024   Procedure: MITRAL VALVE REPAIR USING A SIMULUS SEMI-RIGID ANNULOPLASTY BAND;  Surgeon: Maryjane Mt, MD;  Location: [facility] OR;  Service: Open Heart Surgery;  Laterality: N/A;  possible replacement   ORTHOPEDIC SURGERY     Orthoscopic Knee Surgery   RIGHT/LEFT HEART CATH AND CORONARY ANGIOGRAPHY N/A 12/30/2023   Procedure: RIGHT/LEFT HEART CATH AND CORONARY ANGIOGRAPHY;  Surgeon: Ladona Heinz, MD;  Location: [facility] INVASIVE CV LAB;  Service: Cardiovascular;  Laterality: N/A;   TEAR DUCT PROBING     TEE WITHOUT CARDIOVERSION N/A 02/08/2024   Procedure: ECHOCARDIOGRAM, TRANSESOPHAGEAL;  Surgeon: Maryjane Mt, MD;  Location: [facility] OR;  Service: Open Heart Surgery;  Laterality: N/A;   TOOTH EXTRACTION     bone graft   TRANSESOPHAGEAL ECHOCARDIOGRAM (CATH LAB) N/A 12/30/2023   Procedure: TRANSESOPHAGEAL ECHOCARDIOGRAM;  Surgeon: Loni Soyla DELENA, MD;  Location: [facility] INVASIVE CV LAB;  Service: Cardiovascular;  Laterality: N/A;    Social History   Socioeconomic History   Marital status: Married    Spouse name: Not on file   Number of children: 1   Years of education: Not on file   Highest education level: Not on file  Occupational History   Occupation: Environmental health practitioner  Tobacco Use   Smoking status: Never   Smokeless tobacco: Never  Vaping Use   Vaping status: Never  Used  Substance and Sexual Activity   Alcohol use: Yes    Comment: rarely   Drug use: No   Sexual activity: Never  Other Topics Concern   Not on file  Social History Narrative   Not on file   Social Drivers of Health   Financial Resource Strain: Not on file  Food Insecurity: No Food Insecurity (02/08/2024)   Hunger Vital Sign    Worried About Running Out of Food in the Last Year: Never true    Ran Out of Food in the Last Year: Never true  Transportation Needs: No Transportation Needs (02/08/2024)   PRAPARE - Administrator, Civil Service (Medical): No    Lack of Transportation (Non-Medical): No   Physical Activity: Not on file  Stress: Not on file  Social Connections: Socially Integrated (02/09/2024)   Social Connection and Isolation Panel    Frequency of Communication with Friends and Family: More than three times a week    Frequency of Social Gatherings with Friends and Family: More than three times a week    Attends Religious Services: More than 4 times per year    Active Member of [employer] or Organizations: Yes    Attends Engineer, structural: More than 4 times per year    Marital Status: Married  Catering manager Violence: Not At Risk (02/08/2024)   Humiliation, Afraid, Rape, and Kick questionnaire    Fear of Current or Ex-Partner: No    Emotionally Abused: No    Physically Abused: No    Sexually Abused: No    Family History  Problem Relation Age of Onset   Diabetes type II Mother    Breast cancer Mother    Leukemia Father    Diabetes type II Sister    Breast cancer Sister    Cancer Brother 76       throat 10/2020   Pancreatic cancer Maternal Aunt        x2   Diabetes Maternal Grandmother    Diabetes Maternal Grandfather    Colon cancer Neg Hx    Stomach cancer Neg Hx    Esophageal cancer Neg Hx     Current Outpatient Medications  Medication Sig Dispense Refill   acetaminophen  (TYLENOL ) 500 MG tablet Take 1-2 tablets (500-1,000 mg total) by mouth every 6 (six) hours as needed for mild pain (pain score 1-3) or fever. (Patient taking differently: Take 500-1,000 mg by mouth as needed for mild pain (pain score 1-3) or fever.)     amiodarone  (PACERONE ) 200 MG tablet Take 1 tablet (200 mg total) by mouth daily. 90 tablet 3   amoxicillin (AMOXIL) 500 MG capsule Take 500 mg by mouth as needed. Patient takes 4 capsule before dental procedures     apixaban  (ELIQUIS ) 2.5 MG TABS tablet Take 1 tablet (2.5 mg total) by mouth 2 (two) times daily. 60 tablet 11   atorvastatin  (LIPITOR) 10 MG tablet Take 1 tablet (10 mg total) by mouth daily. 90 tablet 3   b complex  vitamins tablet Take 1 tablet by mouth daily.       CALCIUM  PO Take 650 mg by mouth daily.     Cholecalciferol (VITAMIN D3) 125 MCG (5000 UT) CAPS Take 5,000 Units by mouth daily.     denosumab  (PROLIA ) 60 MG/ML SOSY injection Inject 60 mg into the skin every 6 (six) months.     gabapentin (NEURONTIN) 100 MG capsule Take 100 mg by mouth 2 (two) times daily.  magnesium  gluconate (MAGONATE) 500 (27 Mg) MG TABS tablet Take 1 tablet (500 mg total) by mouth daily.     Misc Natural Products (GLUCOSAMINE CHOND MSM FORMULA) TABS Take 1 tablet by mouth as needed.     Multiple Vitamins-Minerals (MULTIVITAMINS THER. W/MINERALS) TABS Take 1 tablet by mouth daily.       pantoprazole  (PROTONIX ) 40 MG tablet Take 40 mg by mouth daily.     potassium chloride  SA (KLOR-CON  M) 20 MEQ tablet Take 1 tablet (20 mEq total) by mouth 2 (two) times daily. 60 tablet 6   torsemide  (DEMADEX ) 20 MG tablet Take 2 tablets (40 mg total) by mouth in the morning AND 1 tablet (20 mg total) every evening. 90 tablet 3   witch hazel-glycerin  (TUCKS) pad Apply topically as needed for itching.     Zinc 50 MG TABS Take 50 mg by mouth daily.     No current facility-administered medications for this visit.    Allergies  Allergen Reactions   Crestor [Rosuvastatin] Other (See Comments)    Leg cramps   Glycopyrrolate      Other reaction(s): extreme dry mouth   Codeine Other (See Comments)    Hyper     REVIEW OF SYSTEMS:  Negative unless noted in HPI [X]  denotes positive finding, [ ]  denotes negative finding Cardiac  Comments:  Chest pain or chest pressure:    Shortness of breath upon exertion:    Short of breath when lying flat:    Irregular heart rhythm:        Vascular    Pain in calf, thigh, or hip brought on by ambulation:    Pain in feet at night that wakes you up from your sleep:     Blood clot in your veins:    Leg swelling:         Pulmonary    Oxygen at home:    Productive cough:     Wheezing:          Neurologic    Sudden weakness in arms or legs:     Sudden numbness in arms or legs:     Sudden onset of difficulty speaking or slurred speech:    Temporary loss of vision in one eye:     Problems with dizziness:         Gastrointestinal    Blood in stool:     Vomited blood:         Genitourinary    Burning when urinating:     Blood in urine:        Psychiatric    Major depression:         Hematologic    Bleeding problems:    Problems with blood clotting too easily:        Skin    Rashes or ulcers:        Constitutional    Fever or chills:      PHYSICAL EXAMINATION:  Vitals:   07/04/24 0937  BP: 111/69  Pulse: 66  Temp: 97.7 F (36.5 C)  TempSrc: Temporal  Weight: 128 lb 12.8 oz (58.4 kg)    General:  WDWN in NAD; vital signs documented above Gait: Not observed HENT: WNL, normocephalic Pulmonary: normal non-labored breathing Cardiac: irregular HR Abdomen: soft, NT, no masses Skin: without rashes Vascular Exam/Pulses: palpable DP pulses Extremities: without ischemic changes, without Gangrene , without cellulitis; without open wounds; no venous ulcers; pitting edema to the level of the proximal shin bilaterally Musculoskeletal: no muscle  wasting or atrophy  Neurologic: A&O X 3 Psychiatric:  The pt has Normal affect.   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 female here for evaluation of bilateral lower extremity edema  Bilateral lower extremity edema on exam however no areas of weeping or ulcers.  Subjectively, her leg swelling is largely asymptomatic.  Bilateral lower extremity venous reflux study was negative for DVT.  She has mild deep and superficial venous insufficiency.  Her GSV is incompetent in several areas  bilaterally however overall the diameter of the vein is small and would not be a candidate for laser ablation therapy.  Recommendations include continued regular use of compression.  She should also continue to focus on leg elevation when possible during the day.  We also discussed avoiding prolonged sitting and standing and remaining active to promote venous return.  Nothing further to offer from a vascular surgery standpoint.  She will notify the office if she develops wounds or edema drastically worsens.  For now she will follow-up on an as-needed basis.   Donnice Sender, PA-C [facility] [phone]  Clinic MD:   Sheree

## 2024-07-06 ENCOUNTER — Encounter (HOSPITAL_COMMUNITY)
Admission: RE | Admit: 2024-07-06 | Discharge: 2024-07-06 | Disposition: A | Discharge status: Home / Self Care | Source: Ambulatory Visit | Attending: Cardiology | Admitting: Cardiology

## 2024-07-06 DIAGNOSIS — Z9889 Other specified postprocedural states: Secondary | ICD-10-CM

## 2024-07-09 ENCOUNTER — Encounter (HOSPITAL_COMMUNITY)
Admission: RE | Admit: 2024-07-09 | Discharge: 2024-07-09 | Disposition: A | Discharge status: Home / Self Care | Source: Ambulatory Visit | Attending: Cardiology | Admitting: Cardiology

## 2024-07-09 DIAGNOSIS — Z9889 Other specified postprocedural states: Secondary | ICD-10-CM | POA: Diagnosis not present

## 2024-07-10 DIAGNOSIS — S62325A Displaced fracture of shaft of fourth metacarpal bone, left hand, initial encounter for closed fracture: Secondary | ICD-10-CM | POA: Diagnosis not present

## 2024-07-10 DIAGNOSIS — Z23 Encounter for immunization: Secondary | ICD-10-CM | POA: Diagnosis not present

## 2024-07-10 DIAGNOSIS — M79642 Pain in left hand: Secondary | ICD-10-CM | POA: Diagnosis not present

## 2024-07-10 NOTE — Progress Notes (Signed)
 Cardiac Individual Treatment Plan  Patient Details  Name: [name] MRN: [id number] Date of Birth: 11/26/1940 Referring Provider:   Flowsheet Row INTENSIVE CARDIAC REHAB ORIENT from 05/22/2024 in [facility]  Referring Provider Ladona Heinz, MD    Initial Encounter Date:  Flowsheet Row INTENSIVE CARDIAC REHAB ORIENT from 05/22/2024 in [facility]  Date 05/22/24    Visit Diagnosis: S/P MVR (mitral valve repair)  S/P TVR (tricuspid valve repair)  Patient's Home Medications on Admission:  Current Outpatient Medications:    acetaminophen  (TYLENOL ) 500 MG tablet, Take 1-2 tablets (500-1,000 mg total) by mouth every 6 (six) hours as needed for mild pain (pain score 1-3) or fever. (Patient taking differently: Take 500-1,000 mg by mouth as needed for mild pain (pain score 1-3) or fever.), Disp: , Rfl:    amiodarone  (PACERONE ) 200 MG tablet, Take 1 tablet (200 mg total) by mouth daily., Disp: 90 tablet, Rfl: 3   amoxicillin (AMOXIL) 500 MG capsule, Take 500 mg by mouth as needed. Patient takes 4 capsule before dental procedures, Disp: , Rfl:    apixaban  (ELIQUIS ) 2.5 MG TABS tablet, Take 1 tablet (2.5 mg total) by mouth 2 (two) times daily., Disp: 60 tablet, Rfl: 11   atorvastatin  (LIPITOR) 10 MG tablet, Take 1 tablet (10 mg total) by mouth daily., Disp: 90 tablet, Rfl: 3   b complex vitamins tablet, Take 1 tablet by mouth daily.  , Disp: , Rfl:    CALCIUM  PO, Take 650 mg by mouth daily., Disp: , Rfl:    Cholecalciferol (VITAMIN D3) 125 MCG (5000 UT) CAPS, Take 5,000 Units by mouth daily., Disp: , Rfl:    denosumab  (PROLIA ) 60 MG/ML SOSY injection, Inject 60 mg into the skin every 6 (six) months., Disp: , Rfl:    gabapentin (NEURONTIN) 100 MG capsule, Take 100 mg by mouth 2 (two) times daily., Disp: , Rfl:    magnesium  gluconate (MAGONATE) 500 (27 Mg) MG TABS tablet, Take 1  tablet (500 mg total) by mouth daily., Disp: , Rfl:    Misc Natural Products (GLUCOSAMINE CHOND MSM FORMULA) TABS, Take 1 tablet by mouth as needed., Disp: , Rfl:    Multiple Vitamins-Minerals (MULTIVITAMINS THER. W/MINERALS) TABS, Take 1 tablet by mouth daily.  , Disp: , Rfl:    pantoprazole  (PROTONIX ) 40 MG tablet, Take 40 mg by mouth daily., Disp: , Rfl:    potassium chloride  SA (KLOR-CON  M) 20 MEQ tablet, Take 1 tablet (20 mEq total) by mouth 2 (two) times daily., Disp: 60 tablet, Rfl: 6   torsemide  (DEMADEX ) 20 MG tablet, Take 2 tablets (40 mg total) by mouth in the morning AND 1 tablet (20 mg total) every evening., Disp: 90 tablet, Rfl: 3   witch hazel-glycerin  (TUCKS) pad, Apply topically as needed for itching., Disp: , Rfl:    Zinc 50 MG TABS, Take 50 mg by mouth daily., Disp: , Rfl:   Past Medical History: Past Medical History:  Diagnosis Date   Acid reflux    Anemia    Arthritis    Atrial fibrillation (HCC)    Cataract    Diverticulosis of colon    Endometrial cancer (HCC)    1A grade endometrial ca   Essential hypertension 01/24/2009   Qualifier: Diagnosis of  By: Nelson-Smith CMA (AAMA), Dottie     Hx of radiation therapy 04/24/08-05/29/08& 8/12/,8/19,&06/27/08   external beam  through 7/09 ,then  intracavity in 06/2008   Hypertension    Mitral valve prolapse    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   Tricuspid regurgitation     Tobacco Use: Social History   Tobacco Use  Smoking Status Never  Smokeless Tobacco Never    Labs: Review Flowsheet  More data exists      Latest Ref Rng & Units 02/24/2024 02/25/2024 02/26/2024 02/27/2024 02/28/2024  Labs for ITP Cardiac and Pulmonary Rehab  O2 Saturation % 73.9  78  68.3  73.1  82.9     Capillary Blood Glucose: Lab Results  Component Value Date   GLUCAP 122 (H) 02/22/2024   GLUCAP 136 (H) 02/22/2024   GLUCAP 100 (H) 02/22/2024   GLUCAP 102 (H) 02/22/2024   GLUCAP 133 (H) 02/21/2024     Exercise Target  Goals: Exercise Program Goal: Individual exercise prescription set using results from initial 6 min walk test and THRR while considering  patient's activity barriers and safety.   Exercise Prescription Goal: Initial exercise prescription builds to 30-45 minutes a day of aerobic activity, 2-3 days per week.  Home exercise guidelines will be given to patient during program as part of exercise prescription that the participant will acknowledge.  Activity Barriers & Risk Stratification:  Activity Barriers & Cardiac Risk Stratification - 05/22/24 1333       Activity Barriers & Cardiac Risk Stratification   Activity Barriers Arthritis;Assistive Device;Balance Concerns;History of Falls    Cardiac Risk Stratification High          6 Minute Walk:  6 Minute Walk     Row Name 05/22/24 1454         6 Minute Walk   Phase Initial     Distance 747 feet     Walk Time 6 minutes     # of Rest Breaks 0     MPH 1.41     METS 1.25     RPE 11     Perceived Dyspnea  0     VO2 Peak 4.37     Symptoms No     Resting HR 75 bpm     Resting BP 90/50     Resting Oxygen Saturation  96 %     Exercise Oxygen Saturation  during 6 min walk 96 %     Max Ex. HR 88 bpm     Max Ex. BP 106/62     2 Minute Post BP 100/50        Oxygen Initial Assessment:   Oxygen Re-Evaluation:   Oxygen Discharge (Final Oxygen Re-Evaluation):   Initial Exercise Prescription:  Initial Exercise Prescription - 05/22/24 1500       Date of Initial Exercise RX and Referring Provider   Date 05/22/24    Referring Provider Ladona Heinz, MD    Expected Discharge Date 08/17/24      NuStep   Level 1    SPM 85    Minutes 15    METs 1.5      Track   Laps 9    Minutes 15    METs 2.15      Prescription Details   Frequency (times per week) 3    Duration Progress to 30 minutes of continuous aerobic without signs/symptoms of physical distress      Intensity   THRR 40-80% of Max Heartrate 56-111    Ratings of  Perceived Exertion 11-13    Perceived Dyspnea 0-4      Progression   Progression Continue to progress  workloads to maintain intensity without signs/symptoms of physical distress.      Resistance Training   Training Prescription Yes    Weight 2 lbs    Reps 10-15          Perform Capillary Blood Glucose checks as needed.  Exercise Prescription Changes:   Exercise Prescription Changes     Row Name 05/28/24 1651 06/11/24 1508 06/27/24 1642         Response to Exercise   Blood Pressure (Admit) 96/68 114/64 106/52     Blood Pressure (Exercise) 100/62 108/78 104/62     Blood Pressure (Exit) 114/62 110/60 100/52     Heart Rate (Admit) 77 bpm 80 bpm 81 bpm     Heart Rate (Exercise) 85 bpm 95 bpm 103 bpm     Heart Rate (Exit) 81 bpm 77 bpm 83 bpm     Rating of Perceived Exertion (Exercise) 13 13 12      Perceived Dyspnea (Exercise) 0 0 0     Symptoms Mild lightheaded pre exercise. Seated wts/stretches -- none     Comments Pt first day in the [facility] ICR prgram Reviewed home exercise guidelines and goals with [name]. Increased WL on recumbent stepper from 1 to 2. REVD MET's     Duration Progress to 30 minutes of  aerobic without signs/symptoms of physical distress Continue with 30 min of aerobic exercise without signs/symptoms of physical distress. Continue with 30 min of aerobic exercise without signs/symptoms of physical distress.     Intensity THRR unchanged -- --       Progression   Progression Continue to progress workloads to maintain intensity without signs/symptoms of physical distress. Continue to progress workloads to maintain intensity without signs/symptoms of physical distress. Continue to progress workloads to maintain intensity without signs/symptoms of physical distress.     Average METs 1.4 2.1 2.4       Resistance Training   Training Prescription Yes Yes No     Weight 2 lbs 2 lbs 2 lbs     Reps 10-15 10-15 10-15     Time 10 Minutes 10 Minutes 10 Minutes        NuStep   Level 1 2 2      SPM 47 70 89     Minutes 30 30 30      METs 1.4 2.1 2.4       Home Exercise Plan   Plans to continue exercise at -- Home (comment)  Walking Home (comment)  Walking     Frequency -- Add 4 additional days to program exercise sessions. Add 4 additional days to program exercise sessions.     Initial Home Exercises Provided -- 06/11/24 06/11/24        Exercise Comments:   Exercise Comments     Row Name 05/28/24 1656 06/11/24 1535 06/27/24 1644 07/05/24 0901     Exercise Comments Pt first day in the [facility] ICR program. Pt tolerated exercise well with an average MET level of 1.4. Pt is off to a good start and is learning her THRR, RPE and ExRx. Pt mild lightheaded pre exercise, seated wts and stretches for comfort Reviewed home exercise guidelines and goals with [name]. Reviewed MET's. Patient tolerated exercise well with an average MET level of 2.4. She is working on increasing her SPM and has increased her time from 20 to 30 mins on the Nustep. Plans to add in track soon, but is doing very well overall and is increasing MET's and WL's REVD MET's and  goals       Exercise Goals and Review:   Exercise Goals     Row Name 05/22/24 1333             Exercise Goals   Increase Physical Activity Yes       Intervention Provide advice, education, support and counseling about physical activity/exercise needs.;Develop an individualized exercise prescription for aerobic and resistive training based on initial evaluation findings, risk stratification, comorbidities and participant's personal goals.       Expected Outcomes Short Term: Attend rehab on a regular basis to increase amount of physical activity.;Long Term: Exercising regularly at least 3-5 days a week.;Long Term: Add in home exercise to make exercise part of routine and to increase amount of physical activity.       Increase Strength and Stamina Yes       Intervention Provide advice, education, support and  counseling about physical activity/exercise needs.;Develop an individualized exercise prescription for aerobic and resistive training based on initial evaluation findings, risk stratification, comorbidities and participant's personal goals.       Expected Outcomes Short Term: Increase workloads from initial exercise prescription for resistance, speed, and METs.;Short Term: Perform resistance training exercises routinely during rehab and add in resistance training at home;Long Term: Improve cardiorespiratory fitness, muscular endurance and strength as measured by increased METs and functional capacity ( )       Able to understand and use rate of perceived exertion (RPE) scale Yes       Intervention Provide education and explanation on how to use RPE scale       Expected Outcomes Short Term: Able to use RPE daily in rehab to express subjective intensity level;Long Term:  Able to use RPE to guide intensity level when exercising independently       Knowledge and understanding of Target Heart Rate Range (THRR) Yes       Intervention Provide education and explanation of THRR including how the numbers were predicted and where they are located for reference       Expected Outcomes Short Term: Able to state/look up THRR;Long Term: Able to use THRR to govern intensity when exercising independently;Short Term: Able to use daily as guideline for intensity in rehab       Able to check pulse independently Yes       Intervention Provide education and demonstration on how to check pulse in carotid and radial arteries.;Review the importance of being able to check your own pulse for safety during independent exercise       Expected Outcomes Short Term: Able to explain why pulse checking is important during independent exercise;Long Term: Able to check pulse independently and accurately       Understanding of Exercise Prescription Yes       Intervention Provide education, explanation, and written materials on patient's  individual exercise prescription       Expected Outcomes Short Term: Able to explain program exercise prescription;Long Term: Able to explain home exercise prescription to exercise independently          Exercise Goals Re-Evaluation :  Exercise Goals Re-Evaluation     Row Name 05/28/24 1655 06/11/24 1535 07/05/24 0848         Exercise Goal Re-Evaluation   Exercise Goals Review Increase Physical Activity;Understanding of Exercise Prescription;Increase Strength and Stamina;Knowledge and understanding of Target Heart Rate Range (THRR);Able to understand and use rate of perceived exertion (RPE) scale Increase Physical Activity;Understanding of Exercise Prescription;Increase Strength and Stamina;Knowledge and understanding of Target  Heart Rate Range (THRR);Able to understand and use rate of perceived exertion (RPE) scale Increase Physical Activity;Understanding of Exercise Prescription;Increase Strength and Stamina;Knowledge and understanding of Target Heart Rate Range (THRR);Able to understand and use rate of perceived exertion (RPE) scale     Comments Pt first day in the [facility] ICR program. Pt tolerated exercise well with an average MET level of 1.4. Pt is off to a good start and is learning her THRR, RPE and ExRx. Pt mild lightheaded pre exercise, seated wts and stretches for comfort Reviewed exercise prescription with [name]. She is walking daily and increasing distance as tolerated. We discussed gradually increasing walking to 30 minutes: 15 minutes twice daily or 10 minutes three times/day. Her goal is to increase leg strength. REVD MET's and goals. Pt tolerated exercise well with an average MET level of 2.7. Pt is doing well and making gradual increases, just started doing 15 mins at level 3 and has done well with the change, but doesnt feel able to keep up the total time     Expected Outcomes will continue to monitor pt and progress workloads as tolerated without sign or symptom Continue walking at  home in addition to exercise at cardiac rehab to increase strength and improve balance. Will continue to monitor and increase WL's as tolerated        Discharge Exercise Prescription (Final Exercise Prescription Changes):  Exercise Prescription Changes - 06/27/24 1642       Response to Exercise   Blood Pressure (Admit) 106/52    Blood Pressure (Exercise) 104/62    Blood Pressure (Exit) 100/52    Heart Rate (Admit) 81 bpm    Heart Rate (Exercise) 103 bpm    Heart Rate (Exit) 83 bpm    Rating of Perceived Exertion (Exercise) 12    Perceived Dyspnea (Exercise) 0    Symptoms none    Comments REVD MET's    Duration Continue with 30 min of aerobic exercise without signs/symptoms of physical distress.      Progression   Progression Continue to progress workloads to maintain intensity without signs/symptoms of physical distress.    Average METs 2.4      Resistance Training   Training Prescription No    Weight 2 lbs    Reps 10-15    Time 10 Minutes      NuStep   Level 2    SPM 89    Minutes 30    METs 2.4      Home Exercise Plan   Plans to continue exercise at Home (comment)   Walking   Frequency Add 4 additional days to program exercise sessions.    Initial Home Exercises Provided 06/11/24          Nutrition:  Target Goals: Understanding of nutrition guidelines, daily intake of sodium 1500mg , cholesterol 200mg , calories 30% from fat and 7% or less from saturated fats, daily to have 5 or more servings of fruits and vegetables.  Biometrics:  Pre Biometrics - 05/22/24 1319       Pre Biometrics   Waist Circumference 31.13 inches    Hip Circumference 36.75 inches    Waist to Hip Ratio 0.85 %    Triceps Skinfold 8 mm    % Body Fat 29.6 %    Grip Strength 18 kg    Flexibility 13 in    Single Leg Stand --   Unable to perform          Nutrition Therapy Plan and Nutrition Goals:  Nutrition Therapy & Goals - 05/28/24 1554       Nutrition Therapy   Diet Heart  healthy diet      Personal Nutrition Goals   Nutrition Goal Patient to identify strategies for reducing cardiovascular risk by attending the [facility] education and nutrition series weekly.    Personal Goal #2 Patient to improve diet quality by using the plate method as a guide for meal planning to include lean protein/plant protein, fruits, vegetables, whole grains, nonfat dairy as part of a well-balanced diet.    Comments [name] has medical history of hypertension, hyperlipidemia, CKD stage III, diverticulosis, endometrial cancer in 2009 treated with surgery and radiation therapy, mitral valve prolapse and MvR, chronic dyspnea on exertion, iron  deficiency anemia. She is down ~25# since prior to surgery on 01/13/24 (158#). She reports improved appetite and is motivated to maintain weight at this time. Reviewed protein intake and protein sources. She continues to enjoy a wide variety of foods and follows a low sodium diet. Patient will benefit from participation in intensive cardiac rehab for nutrition education, exercise, and lifestyle modification.      Intervention Plan   Intervention Prescribe, educate and counsel regarding individualized specific dietary modifications aiming towards targeted core components such as weight, hypertension, lipid management, diabetes, heart failure and other comorbidities.;Nutrition handout(s) given to patient.    Expected Outcomes Short Term Goal: Understand basic principles of dietary content, such as calories, fat, sodium, cholesterol and nutrients.;Long Term Goal: Adherence to prescribed nutrition plan.          Nutrition Assessments:  MEDIFICTS Score Key: >=70 Need to make dietary changes  40-70 Heart Healthy Diet <= 40 Therapeutic Level Cholesterol Diet    Picture Your Plate Scores: <59 Unhealthy dietary pattern with much room for improvement. 41-50 Dietary pattern unlikely to meet recommendations for good health and room for improvement. 51-60 More  healthful dietary pattern, with some room for improvement.  >60 Healthy dietary pattern, although there may be some specific behaviors that could be improved.    Nutrition Goals Re-Evaluation:  Nutrition Goals Re-Evaluation     Row Name 05/28/24 1554             Goals   Current Weight 132 lb 11.5 oz (60.2 kg)       Comment Lpa 107, A1c WNL, hemoglobin 10.9       Expected Outcome [name] has medical history of hypertension, hyperlipidemia, CKD stage III, diverticulosis, endometrial cancer in 2009 treated with surgery and radiation therapy, mitral valve prolapse and MvR, chronic dyspnea on exertion, iron  deficiency anemia. She is down ~25# since prior to surgery on 01/13/24 (158#). She reports improved appetite and is motivated to maintain weight at this time. Reviewed protein intake and protein sources. She continues to enjoy a wide variety of foods and follows a low sodium diet. Patient will benefit from participation in intensive cardiac rehab for nutrition education, exercise, and lifestyle modification.          Nutrition Goals Re-Evaluation:  Nutrition Goals Re-Evaluation     Row Name 05/28/24 1554             Goals   Current Weight 132 lb 11.5 oz (60.2 kg)       Comment Lpa 107, A1c WNL, hemoglobin 10.9       Expected Outcome [name] has medical history of hypertension, hyperlipidemia, CKD stage III, diverticulosis, endometrial cancer in 2009 treated with surgery and radiation therapy, mitral valve prolapse and MvR, chronic dyspnea on exertion,  iron  deficiency anemia. She is down ~25# since prior to surgery on 01/13/24 (158#). She reports improved appetite and is motivated to maintain weight at this time. Reviewed protein intake and protein sources. She continues to enjoy a wide variety of foods and follows a low sodium diet. Patient will benefit from participation in intensive cardiac rehab for nutrition education, exercise, and lifestyle modification.          Nutrition Goals  Discharge (Final Nutrition Goals Re-Evaluation):  Nutrition Goals Re-Evaluation - 05/28/24 1554       Goals   Current Weight 132 lb 11.5 oz (60.2 kg)    Comment Lpa 107, A1c WNL, hemoglobin 10.9    Expected Outcome [name] has medical history of hypertension, hyperlipidemia, CKD stage III, diverticulosis, endometrial cancer in 2009 treated with surgery and radiation therapy, mitral valve prolapse and MvR, chronic dyspnea on exertion, iron  deficiency anemia. She is down ~25# since prior to surgery on 01/13/24 (158#). She reports improved appetite and is motivated to maintain weight at this time. Reviewed protein intake and protein sources. She continues to enjoy a wide variety of foods and follows a low sodium diet. Patient will benefit from participation in intensive cardiac rehab for nutrition education, exercise, and lifestyle modification.          Psychosocial: Target Goals: Acknowledge presence or absence of significant depression and/or stress, maximize coping skills, provide positive support system. Participant is able to verbalize types and ability to use techniques and skills needed for reducing stress and depression.  Initial Review & Psychosocial Screening:  Initial Psych Review & Screening - 05/22/24 1431       Initial Review   Current issues with None Identified      Family Dynamics   Good Support System? Yes    Comments [name] has excellent support from her husband.      Barriers   Psychosocial barriers to participate in program There are no identifiable barriers or psychosocial needs.      Screening Interventions   Interventions Encouraged to exercise    Expected Outcomes Short Term goal: Identification and review with participant of any Quality of Life or Depression concerns found by scoring the questionnaire.;Long Term goal: The participant improves quality of Life and PHQ9 Scores as seen by post scores and/or verbalization of changes          Quality of Life  Scores:  Quality of Life - 05/22/24 1556       Quality of Life   Select Quality of Life      Quality of Life Scores   Health/Function Pre 28.18 %    Socioeconomic Pre 30 %    Psych/Spiritual Pre 30 %    Family Pre 30 %    GLOBAL Pre 29.23 %         Scores of 19 and below usually indicate a poorer quality of life in these areas.  A difference of  2-3 points is a clinically meaningful difference.  A difference of 2-3 points in the total score of the Quality of Life Index has been associated with significant improvement in overall quality of life, self-image, physical symptoms, and general health in studies assessing change in quality of life.  PHQ-9: Review Flowsheet       05/22/2024 12/20/2023  Depression screen PHQ 2/9  Decreased Interest 0 0  Down, Depressed, Hopeless 0 0  PHQ - 2 Score 0 0  Altered sleeping 3 -  Tired, decreased energy 1 -  Change in  appetite 0 -  Feeling bad or failure about yourself  0 -  Trouble concentrating 0 -  Moving slowly or fidgety/restless 0 -  Suicidal thoughts 0 -  PHQ-9 Score 4 -  Difficult doing work/chores Not difficult at all -   Interpretation of Total Score  Total Score Depression Severity:  1-4 = Minimal depression, 5-9 = Mild depression, 10-14 = Moderate depression, 15-19 = Moderately severe depression, 20-27 = Severe depression   Psychosocial Evaluation and Intervention:   Psychosocial Re-Evaluation:  Psychosocial Re-Evaluation     Row Name 05/29/24 0804 06/08/24 1312 07/10/24 1555         Psychosocial Re-Evaluation   Current issues with None Identified None Identified Current Sleep Concerns     Comments -- -- Reviewed PHQ9. Norely says that her sleeping and energy level have improved since participating in cardiac rehab.     Interventions Encouraged to attend Cardiac Rehabilitation for the exercise Encouraged to attend Cardiac Rehabilitation for the exercise Encouraged to attend Cardiac Rehabilitation for the exercise      Continue Psychosocial Services  No Follow up required No Follow up required No Follow up required        Psychosocial Discharge (Final Psychosocial Re-Evaluation):  Psychosocial Re-Evaluation - 07/10/24 1555       Psychosocial Re-Evaluation   Current issues with Current Sleep Concerns    Comments Reviewed PHQ9. [name] says that her sleeping and energy level have improved since participating in cardiac rehab.    Interventions Encouraged to attend Cardiac Rehabilitation for the exercise    Continue Psychosocial Services  No Follow up required          Vocational Rehabilitation: Provide vocational rehab assistance to qualifying candidates.   Vocational Rehab Evaluation & Intervention:  Vocational Rehab - 05/22/24 1432       Initial Vocational Rehab Evaluation & Intervention   Assessment shows need for Vocational Rehabilitation No      Vocational Rehab Re-Evaulation   Comments [name] is currently working and dosn't have any vocational rehab needs at this time. Cleared to return to work on 06/01/24. Five hours, five days per week. Full-time in january.          Education: Education Goals: Education classes will be provided on a weekly basis, covering required topics. Participant will state understanding/return demonstration of topics presented.    Education     Row Name 05/28/24 1600     Education   Cardiac Education Topics Pritikin   Nurse, children's Exercise Physiologist   Select Psychosocial   Psychosocial Healthy Minds, Bodies, Hearts   Instruction Review Code 1- Verbalizes Understanding   Class Start Time 1415   Class Stop Time 1450   Class Time Calculation (min) 35 min    Row Name 06/01/24 1400     Education   Cardiac Education Topics Pritikin   Select Workshops     Workshops   Educator Exercise Physiologist   Select Exercise   Exercise Workshop Location manager and Fall Prevention   Instruction Review Code 1- Verbalizes  Understanding   Class Start Time 1402   Class Stop Time 1441   Class Time Calculation (min) 39 min    Row Name 06/04/24 1500     Education   Cardiac Education Topics Pritikin   Glass blower/designer Nutrition   Nutrition Workshop Label Reading   Instruction Review Code 1-  Verbalizes Understanding   Class Start Time 1400   Class Stop Time 1450   Class Time Calculation (min) 50 min    Row Name 06/06/24 1500     Education   Cardiac Education Topics Pritikin   Customer service manager   Weekly Topic Fast and Healthy Breakfasts   Instruction Review Code 1- Verbalizes Understanding   Class Start Time 1400   Class Stop Time 1440   Class Time Calculation (min) 40 min    Row Name 06/11/24 1400     Education   Cardiac Education Topics Pritikin   Hospital doctor Education   General Education Metabolic Syndrome and Belly Fat   Instruction Review Code 1- Verbalizes Understanding   Class Start Time 1403   Class Stop Time 1445   Class Time Calculation (min) 42 min    Row Name 06/13/24 1500     Education   Cardiac Education Topics Pritikin   Customer service manager   Weekly Topic Personalizing Your Pritikin Plate   Instruction Review Code 1- Verbalizes Understanding   Class Start Time 1400   Class Stop Time 1445   Class Time Calculation (min) 45 min    Row Name 06/18/24 1500     Education   Cardiac Education Topics Pritikin   [employer] Workshops     Workshops   Educator Exercise Physiologist   Select Psychosocial   Psychosocial Workshop Recognizing and Reducing Stress   Instruction Review Code 1- Verbalizes Understanding   Class Start Time 1405   Class Stop Time 1448   Class Time Calculation (min) 43 min    Row Name 06/20/24 1400     Education   Cardiac Education Topics  Pritikin   Customer service manager   Weekly Topic [employer] Desserts   Instruction Review Code 1- Verbalizes Understanding   Class Start Time 1400   Class Stop Time 1450   Class Time Calculation (min) 50 min    Row Name 06/22/24 1500     Education   Licensed conveyancer Nutrition   Nutrition Calorie Density   Instruction Review Code 1- Verbalizes Understanding   Class Start Time 1400   Class Stop Time 1435   Class Time Calculation (min) 35 min    Row Name 06/25/24 1400     Education   Cardiac Education Topics Pritikin   [employer]     Workshops   Educator Exercise Physiologist   Select Exercise   Exercise Workshop Exercise Basics: Diplomatic Services operational officer   Instruction Review Code 1- Verbalizes Understanding   Class Start Time 1420   Class Stop Time 1500   Class Time Calculation (min) 40 min    Row Name 06/27/24 1500     Education   Cardiac Education Topics Pritikin   [employer]    Row Name 06/29/24 1400     Education   Cardiac Education Topics Pritikin   Psychologist, forensic Exercise Education   Exercise Education Move It!   Instruction Review Code 1- Verbalizes Understanding   Class Start Time 1359   Class Stop  Time 1432   Class Time Calculation (min) 33 min    Row Name 07/04/24 1400     Education   Cardiac Education Topics Pritikin   Customer service manager   Weekly Topic One-[employer] Wonders   Instruction Review Code 1- Verbalizes Understanding   Class Start Time 1400   Class Stop Time 1440   Class Time Calculation (min) 40 min    Row Name 07/06/24 1500     Education   Cardiac Education Topics Pritikin   Writer General Education   General Education Hypertension and Heart Disease    Instruction Review Code 1- Verbalizes Understanding   Class Start Time 1403   Class Stop Time 1440   Class Time Calculation (min) 37 min    Row Name 07/09/24 1500     Education   Cardiac Education Topics Pritikin   Geographical information systems officer Psychosocial   Psychosocial Workshop Focused Goals, Sustainable Changes   Instruction Review Code 1- Verbalizes Understanding   Class Start Time 1359   Class Stop Time 1434   Class Time Calculation (min) 35 min      Core Videos: Exercise    Move It!  Clinical staff conducted group or individual video education with verbal and written material and guidebook.  Patient learns the recommended Pritikin exercise program. Exercise with the goal of living a long, healthy life. Some of the health benefits of exercise include controlled diabetes, healthier blood pressure levels, improved cholesterol levels, improved heart and lung capacity, improved sleep, and better body composition. Everyone should speak with their doctor before starting or changing an exercise routine.  Biomechanical Limitations Clinical staff conducted group or individual video education with verbal and written material and guidebook.  Patient learns how biomechanical limitations can impact exercise and how we can mitigate and possibly overcome limitations to have an impactful and balanced exercise routine.  Body Composition Clinical staff conducted group or individual video education with verbal and written material and guidebook.  Patient learns that body composition (ratio of muscle mass to fat mass) is a key component to assessing overall fitness, rather than body weight alone. Increased fat mass, especially visceral belly fat, can put us  at increased risk for metabolic syndrome, type 2 diabetes, heart disease, and even death. It is recommended to combine diet and exercise (cardiovascular and resistance training) to improve your body  composition. Seek guidance from your physician and exercise physiologist before implementing an exercise routine.  Exercise Action Plan Clinical staff conducted group or individual video education with verbal and written material and guidebook.  Patient learns the recommended strategies to achieve and enjoy long-term exercise adherence, including variety, self-motivation, self-efficacy, and positive decision making. Benefits of exercise include fitness, good health, weight management, more energy, better sleep, less stress, and overall well-being.  Medical   Heart Disease Risk Reduction Clinical staff conducted group or individual video education with verbal and written material and guidebook.  Patient learns our heart is our most vital organ as it circulates oxygen, nutrients, white blood cells, and hormones throughout the entire body, and carries waste away. Data supports a plant-based eating plan like the Pritikin Program for its effectiveness in slowing progression of and reversing heart disease. The video provides a number of recommendations to address heart disease.   Metabolic Syndrome  and Belly Fat  Clinical staff conducted group or individual video education with verbal and written material and guidebook.  Patient learns what metabolic syndrome is, how it leads to heart disease, and how one can reverse it and keep it from coming back. You have metabolic syndrome if you have 3 of the following 5 criteria: abdominal obesity, high blood pressure, high triglycerides, low HDL cholesterol, and high blood sugar.  Hypertension and Heart Disease Clinical staff conducted group or individual video education with verbal and written material and guidebook.  Patient learns that high blood pressure, or hypertension, is very common in the [place] . Hypertension is largely due to excessive salt intake, but other important risk factors include being overweight, physical inactivity, drinking too much  alcohol, smoking, and not eating enough potassium from fruits and vegetables. High blood pressure is a leading risk factor for heart attack, stroke, congestive heart failure, dementia, kidney failure, and premature death. Long-term effects of excessive salt intake include stiffening of the arteries and thickening of heart muscle and organ damage. Recommendations include ways to reduce hypertension and the risk of heart disease.  Diseases of Our Time - Focusing on Diabetes Clinical staff conducted group or individual video education with verbal and written material and guidebook.  Patient learns why the best way to stop diseases of our time is prevention, through food and other lifestyle changes. Medicine (such as prescription pills and surgeries) is often only a Band-Aid on the problem, not a long-term solution. Most common diseases of our time include obesity, type 2 diabetes, hypertension, heart disease, and cancer. The Pritikin Program is recommended and has been proven to help reduce, reverse, and/or prevent the damaging effects of metabolic syndrome.  Nutrition   Overview of the Pritikin Eating Plan  Clinical staff conducted group or individual video education with verbal and written material and guidebook.  Patient learns about the Pritikin Eating Plan for disease risk reduction. The Pritikin Eating Plan emphasizes a wide variety of unrefined, minimally-processed carbohydrates, like fruits, vegetables, whole grains, and legumes. Go, Caution, and Stop food choices are explained. Plant-based and lean animal proteins are emphasized. Rationale provided for low sodium intake for blood pressure control, low added sugars for blood sugar stabilization, and low added fats and oils for coronary artery disease risk reduction and weight management.  Calorie Density  Clinical staff conducted group or individual video education with verbal and written material and guidebook.  Patient learns about calorie  density and how it impacts the Pritikin Eating Plan. Knowing the characteristics of the food you choose will help you decide whether those foods will lead to weight gain or weight loss, and whether you want to consume more or less of them. Weight loss is usually a side effect of the Pritikin Eating Plan because of its focus on low calorie-dense foods.  Label Reading  Clinical staff conducted group or individual video education with verbal and written material and guidebook.  Patient learns about the Pritikin recommended label reading guidelines and corresponding recommendations regarding calorie density, added sugars, sodium content, and whole grains.  Dining Out - Part 1  Clinical staff conducted group or individual video education with verbal and written material and guidebook.  Patient learns that restaurant meals can be sabotaging because they can be so high in calories, fat, sodium, and/or sugar. Patient learns recommended strategies on how to positively address this and avoid unhealthy pitfalls.  Facts on Fats  Clinical staff conducted group or individual video education with verbal and  written material and guidebook.  Patient learns that lifestyle modifications can be just as effective, if not more so, as many medications for lowering your risk of heart disease. A Pritikin lifestyle can help to reduce your risk of inflammation and atherosclerosis (cholesterol build-up, or plaque, in the artery walls). Lifestyle interventions such as dietary choices and physical activity address the cause of atherosclerosis. A review of the types of fats and their impact on blood cholesterol levels, along with dietary recommendations to reduce fat intake is also included.  Nutrition Action Plan  Clinical staff conducted group or individual video education with verbal and written material and guidebook.  Patient learns how to incorporate Pritikin recommendations into their lifestyle. Recommendations include  planning and keeping personal health goals in mind as an important part of their success.  Healthy Mind-Set    Healthy Minds, Bodies, Hearts  Clinical staff conducted group or individual video education with verbal and written material and guidebook.  Patient learns how to identify when they are stressed. Video will discuss the impact of that stress, as well as the many benefits of stress management. Patient will also be introduced to stress management techniques. The way we think, act, and feel has an impact on our hearts.  How Our Thoughts Can Heal Our Hearts  Clinical staff conducted group or individual video education with verbal and written material and guidebook.  Patient learns that negative thoughts can cause depression and anxiety. This can result in negative lifestyle behavior and serious health problems. Cognitive behavioral therapy is an effective method to help control our thoughts in order to change and improve our emotional outlook.  Additional Videos:  Exercise    Improving Performance  Clinical staff conducted group or individual video education with verbal and written material and guidebook.  Patient learns to use a non-linear approach by alternating intensity levels and lengths of time spent exercising to help burn more calories and lose more body fat. Cardiovascular exercise helps improve heart health, metabolism, hormonal balance, blood sugar control, and recovery from fatigue. Resistance training improves strength, endurance, balance, coordination, reaction time, metabolism, and muscle mass. Flexibility exercise improves circulation, posture, and balance. Seek guidance from your physician and exercise physiologist before implementing an exercise routine and learn your capabilities and proper form for all exercise.  Introduction to Yoga  Clinical staff conducted group or individual video education with verbal and written material and guidebook.  Patient learns about yoga, a  discipline of the coming together of mind, breath, and body. The benefits of yoga include improved flexibility, improved range of motion, better posture and core strength, increased lung function, weight loss, and positive self-image. Yoga's heart health benefits include lowered blood pressure, healthier heart rate, decreased cholesterol and triglyceride levels, improved immune function, and reduced stress. Seek guidance from your physician and exercise physiologist before implementing an exercise routine and learn your capabilities and proper form for all exercise.  Medical   Aging: Enhancing Your Quality of Life  Clinical staff conducted group or individual video education with verbal and written material and guidebook.  Patient learns key strategies and recommendations to stay in good physical health and enhance quality of life, such as prevention strategies, having an advocate, securing a Health Care Proxy and Power of Attorney, and keeping a list of medications and system for tracking them. It also discusses how to avoid risk for bone loss.  Biology of Weight Control  Clinical staff conducted group or individual video education with verbal and written material and  guidebook.  Patient learns that weight gain occurs because we consume more calories than we burn (eating more, moving less). Even if your body weight is normal, you may have higher ratios of fat compared to muscle mass. Too much body fat puts you at increased risk for cardiovascular disease, heart attack, stroke, type 2 diabetes, and obesity-related cancers. In addition to exercise, following the Pritikin Eating Plan can help reduce your risk.  Decoding Lab Results  Clinical staff conducted group or individual video education with verbal and written material and guidebook.  Patient learns that lab test reflects one measurement whose values change over time and are influenced by many factors, including medication, stress, sleep, exercise,  food, hydration, pre-existing medical conditions, and more. It is recommended to use the knowledge from this video to become more involved with your lab results and evaluate your numbers to speak with your doctor.   Diseases of Our Time - Overview  Clinical staff conducted group or individual video education with verbal and written material and guidebook.  Patient learns that according to the CDC, 50% to 70% of chronic diseases (such as obesity, type 2 diabetes, elevated lipids, hypertension, and heart disease) are avoidable through lifestyle improvements including healthier food choices, listening to satiety cues, and increased physical activity.  Sleep Disorders Clinical staff conducted group or individual video education with verbal and written material and guidebook.  Patient learns how good quality and duration of sleep are important to overall health and well-being. Patient also learns about sleep disorders and how they impact health along with recommendations to address them, including discussing with a physician.  Nutrition  Dining Out - Part 2 Clinical staff conducted group or individual video education with verbal and written material and guidebook.  Patient learns how to plan ahead and communicate in order to maximize their dining experience in a healthy and nutritious manner. Included are recommended food choices based on the type of restaurant the patient is visiting.   Fueling a Banker conducted group or individual video education with verbal and written material and guidebook.  There is a strong connection between our food choices and our health. Diseases like obesity and type 2 diabetes are very prevalent and are in large-part due to lifestyle choices. The Pritikin Eating Plan provides plenty of food and hunger-curbing satisfaction. It is easy to follow, affordable, and helps reduce health risks.  Menu Workshop  Clinical staff conducted group or individual  video education with verbal and written material and guidebook.  Patient learns that restaurant meals can sabotage health goals because they are often packed with calories, fat, sodium, and sugar. Recommendations include strategies to plan ahead and to communicate with the manager, chef, or server to help order a healthier meal.  Planning Your Eating Strategy  Clinical staff conducted group or individual video education with verbal and written material and guidebook.  Patient learns about the Pritikin Eating Plan and its benefit of reducing the risk of disease. The Pritikin Eating Plan does not focus on calories. Instead, it emphasizes high-quality, nutrient-rich foods. By knowing the characteristics of the foods, we choose, we can determine their calorie density and make informed decisions.  Targeting Your Nutrition Priorities  Clinical staff conducted group or individual video education with verbal and written material and guidebook.  Patient learns that lifestyle habits have a tremendous impact on disease risk and progression. This video provides eating and physical activity recommendations based on your personal health goals, such as reducing LDL  cholesterol, losing weight, preventing or controlling type 2 diabetes, and reducing high blood pressure.  Vitamins and Minerals  Clinical staff conducted group or individual video education with verbal and written material and guidebook.  Patient learns different ways to obtain key vitamins and minerals, including through a recommended healthy diet. It is important to discuss all supplements you take with your doctor.   Healthy Mind-Set    Smoking Cessation  Clinical staff conducted group or individual video education with verbal and written material and guidebook.  Patient learns that cigarette smoking and tobacco addiction pose a serious health risk which affects millions of people. Stopping smoking will significantly reduce the risk of heart  disease, lung disease, and many forms of cancer. Recommended strategies for quitting are covered, including working with your doctor to develop a successful plan.  Culinary   Becoming a Set designer conducted group or individual video education with verbal and written material and guidebook.  Patient learns that cooking at home can be healthy, cost-effective, quick, and puts them in control. Keys to cooking healthy recipes will include looking at your recipe, assessing your equipment needs, planning ahead, making it simple, choosing cost-effective seasonal ingredients, and limiting the use of added fats, salts, and sugars.  Cooking - Breakfast and Snacks  Clinical staff conducted group or individual video education with verbal and written material and guidebook.  Patient learns how important breakfast is to satiety and nutrition through the entire day. Recommendations include key foods to eat during breakfast to help stabilize blood sugar levels and to prevent overeating at meals later in the day. Planning ahead is also a key component.  Cooking - Educational psychologist conducted group or individual video education with verbal and written material and guidebook.  Patient learns eating strategies to improve overall health, including an approach to cook more at home. Recommendations include thinking of animal protein as a side on your plate rather than center stage and focusing instead on lower calorie dense options like vegetables, fruits, whole grains, and plant-based proteins, such as beans. Making sauces in large quantities to freeze for later and leaving the skin on your vegetables are also recommended to maximize your experience.  Cooking - Healthy Salads and Dressing Clinical staff conducted group or individual video education with verbal and written material and guidebook.  Patient learns that vegetables, fruits, whole grains, and legumes are the foundations of the  Pritikin Eating Plan. Recommendations include how to incorporate each of these in flavorful and healthy salads, and how to create homemade salad dressings. Proper handling of ingredients is also covered. Cooking - Soups and [employer] - Soups and Desserts Clinical staff conducted group or individual video education with verbal and written material and guidebook.  Patient learns that Pritikin soups and desserts make for easy, nutritious, and delicious snacks and meal components that are low in sodium, fat, sugar, and calorie density, while high in vitamins, minerals, and filling fiber. Recommendations include simple and healthy ideas for soups and desserts.   Overview     The Pritikin Solution Program Overview Clinical staff conducted group or individual video education with verbal and written material and guidebook.  Patient learns that the results of the Pritikin Program have been documented in more than 100 articles published in peer-reviewed journals, and the benefits include reducing risk factors for (and, in some cases, even reversing) high cholesterol, high blood pressure, type 2 diabetes, obesity, and more! An overview of the three  key pillars of the Pritikin Program will be covered: eating well, doing regular exercise, and having a healthy mind-set.  WORKSHOPS  Exercise: Exercise Basics: Building Your Action Plan Clinical staff led group instruction and group discussion with PowerPoint presentation and patient guidebook. To enhance the learning environment the use of posters, models and videos may be added. At the conclusion of this workshop, patients will comprehend the difference between physical activity and exercise, as well as the benefits of incorporating both, into their routine. Patients will understand the FITT (Frequency, Intensity, Time, and Type) principle and how to use it to build an exercise action plan. In addition, safety concerns and other considerations for  exercise and cardiac rehab will be addressed by the presenter. The purpose of this lesson is to promote a comprehensive and effective weekly exercise routine in order to improve patients' overall level of fitness.   Managing Heart Disease: Your Path to a Healthier Heart Clinical staff led group instruction and group discussion with PowerPoint presentation and patient guidebook. To enhance the learning environment the use of posters, models and videos may be added.At the conclusion of this workshop, patients will understand the anatomy and physiology of the heart. Additionally, they will understand how Pritikin's three pillars impact the risk factors, the progression, and the management of heart disease.  The purpose of this lesson is to provide a high-level overview of the heart, heart disease, and how the Pritikin lifestyle positively impacts risk factors.  Exercise Biomechanics Clinical staff led group instruction and group discussion with PowerPoint presentation and patient guidebook. To enhance the learning environment the use of posters, models and videos may be added. Patients will learn how the structural parts of their bodies function and how these functions impact their daily activities, movement, and exercise. Patients will learn how to promote a neutral spine, learn how to manage pain, and identify ways to improve their physical movement in order to promote healthy living. The purpose of this lesson is to expose patients to common physical limitations that impact physical activity. Participants will learn practical ways to adapt and manage aches and pains, and to minimize their effect on regular exercise. Patients will learn how to maintain good posture while sitting, walking, and lifting.  Balance Training and Fall Prevention  Clinical staff led group instruction and group discussion with PowerPoint presentation and patient guidebook. To enhance the learning environment the use of  posters, models and videos may be added. At the conclusion of this workshop, patients will understand the importance of their sensorimotor skills (vision, proprioception, and the vestibular system) in maintaining their ability to balance as they age. Patients will apply a variety of balancing exercises that are appropriate for their current level of function. Patients will understand the common causes for poor balance, possible solutions to these problems, and ways to modify their physical environment in order to minimize their fall risk. The purpose of this lesson is to teach patients about the importance of maintaining balance as they age and ways to minimize their risk of falling.  WORKSHOPS   Nutrition:  Fueling a Ship broker led group instruction and group discussion with PowerPoint presentation and patient guidebook. To enhance the learning environment the use of posters, models and videos may be added. Patients will review the foundational principles of the Pritikin Eating Plan and understand what constitutes a serving size in each of the food groups. Patients will also learn Pritikin-friendly foods that are better choices when away from home and review  make-ahead meal and snack options. Calorie density will be reviewed and applied to three nutrition priorities: weight maintenance, weight loss, and weight gain. The purpose of this lesson is to reinforce (in a group setting) the key concepts around what patients are recommended to eat and how to apply these guidelines when away from home by planning and selecting Pritikin-friendly options. Patients will understand how calorie density may be adjusted for different weight management goals.  Mindful Eating  Clinical staff led group instruction and group discussion with PowerPoint presentation and patient guidebook. To enhance the learning environment the use of posters, models and videos may be added. Patients will briefly review the  concepts of the Pritikin Eating Plan and the importance of low-calorie dense foods. The concept of mindful eating will be introduced as well as the importance of paying attention to internal hunger signals. Triggers for non-hunger eating and techniques for dealing with triggers will be explored. The purpose of this lesson is to provide patients with the opportunity to review the basic principles of the Pritikin Eating Plan, discuss the value of eating mindfully and how to measure internal cues of hunger and fullness using the Hunger Scale. Patients will also discuss reasons for non-hunger eating and learn strategies to use for controlling emotional eating.  Targeting Your Nutrition Priorities Clinical staff led group instruction and group discussion with PowerPoint presentation and patient guidebook. To enhance the learning environment the use of posters, models and videos may be added. Patients will learn how to determine their genetic susceptibility to disease by reviewing their family history. Patients will gain insight into the importance of diet as part of an overall healthy lifestyle in mitigating the impact of genetics and other environmental insults. The purpose of this lesson is to provide patients with the opportunity to assess their personal nutrition priorities by looking at their family history, their own health history and current risk factors. Patients will also be able to discuss ways of prioritizing and modifying the Pritikin Eating Plan for their highest risk areas  Menu  Clinical staff led group instruction and group discussion with PowerPoint presentation and patient guidebook. To enhance the learning environment the use of posters, models and videos may be added. Using menus brought in from [employer], or printed from [employer], patients will apply the Pritikin dining out guidelines that were presented in the [employer] video. Patients will also be able to  practice these guidelines in a variety of provided scenarios. The purpose of this lesson is to provide patients with the opportunity to practice hands-on learning of the Pritikin Dining Out guidelines with actual menus and practice scenarios.  Label Reading Clinical staff led group instruction and group discussion with PowerPoint presentation and patient guidebook. To enhance the learning environment the use of posters, models and videos may be added. Patients will review and discuss the Pritikin label reading guidelines presented in Pritikin's Label Reading Educational series video. Using fool labels brought in from local grocery stores and markets, patients will apply the label reading guidelines and determine if the packaged food meet the Pritikin guidelines. The purpose of this lesson is to provide patients with the opportunity to review, discuss, and practice hands-on learning of the Pritikin Label Reading guidelines with actual packaged food labels. Cooking School  Pritikin's [employer] are designed to teach patients ways to prepare quick, simple, and affordable recipes at home. The importance of nutrition's role in chronic disease risk reduction is reflected in its emphasis in  the overall Pritikin program. By learning how to prepare essential core Pritikin Eating Plan recipes, patients will increase control over what they eat; be able to customize the flavor of foods without the use of added salt, sugar, or fat; and improve the quality of the food they consume. By learning a set of core recipes which are easily assembled, quickly prepared, and affordable, patients are more likely to prepare more healthy foods at home. These workshops focus on convenient breakfasts, simple entres, side dishes, and desserts which can be prepared with minimal effort and are consistent with nutrition recommendations for cardiovascular risk reduction. Cooking [employer] are taught by a Interior and spatial designer (RD) who has been trained by the [employer]. The chef or RD has a clear understanding of the importance of minimizing - if not completely eliminating - added fat, sugar, and sodium in recipes. Throughout the series of Cooking School Workshop sessions, patients will learn about healthy ingredients and efficient methods of cooking to build confidence in their capability to prepare    Cooking School weekly topics:  Adding Flavor- Sodium-Free  Fast and Healthy Breakfasts  Powerhouse Plant-Based Proteins  Satisfying Salads and Dressings  Simple Sides and Sauces  International Cuisine-Spotlight on the [employer] Zones  Delicious Desserts  Savory Soups  [employer] - Meals in a Astronomer Appetizers and Snacks  Comforting Weekend Breakfasts  One-Pot Wonders   Fast Evening Meals  Landscape architect Your Pritikin Plate  WORKSHOPS   Healthy Mindset (Psychosocial):  Focused Goals, Sustainable Changes Clinical staff led group instruction and group discussion with PowerPoint presentation and patient guidebook. To enhance the learning environment the use of posters, models and videos may be added. Patients will be able to apply effective goal setting strategies to establish at least one personal goal, and then take consistent, meaningful action toward that goal. They will learn to identify common barriers to achieving personal goals and develop strategies to overcome them. Patients will also gain an understanding of how our mind-set can impact our ability to achieve goals and the importance of cultivating a positive and growth-oriented mind-set. The purpose of this lesson is to provide patients with a deeper understanding of how to set and achieve personal goals, as well as the tools and strategies needed to overcome common obstacles which may arise along the way.  From Head to Heart: The Power of a Healthy Outlook  Clinical staff led group instruction and  group discussion with PowerPoint presentation and patient guidebook. To enhance the learning environment the use of posters, models and videos may be added. Patients will be able to recognize and describe the impact of emotions and mood on physical health. They will discover the importance of self-care and explore self-care practices which may work for them. Patients will also learn how to utilize the 4 C's to cultivate a healthier outlook and better manage stress and challenges. The purpose of this lesson is to demonstrate to patients how a healthy outlook is an essential part of maintaining good health, especially as they continue their cardiac rehab journey.  Healthy Sleep for a Healthy Heart Clinical staff led group instruction and group discussion with PowerPoint presentation and patient guidebook. To enhance the learning environment the use of posters, models and videos may be added. At the conclusion of this workshop, patients will be able to demonstrate knowledge of the importance of sleep to overall health, well-being, and quality of life. They will understand the symptoms of,  and treatments for, common sleep disorders. Patients will also be able to identify daytime and nighttime behaviors which impact sleep, and they will be able to apply these tools to help manage sleep-related challenges. The purpose of this lesson is to provide patients with a general overview of sleep and outline the importance of quality sleep. Patients will learn about a few of the most common sleep disorders. Patients will also be introduced to the concept of "sleep hygiene," and discover ways to self-manage certain sleeping problems through simple daily behavior changes. Finally, the workshop will motivate patients by clarifying the links between quality sleep and their goals of heart-healthy living.   Recognizing and Reducing Stress Clinical staff led group instruction and group discussion with PowerPoint presentation and  patient guidebook. To enhance the learning environment the use of posters, models and videos may be added. At the conclusion of this workshop, patients will be able to understand the types of stress reactions, differentiate between acute and chronic stress, and recognize the impact that chronic stress has on their health. They will also be able to apply different coping mechanisms, such as reframing negative self-talk. Patients will have the opportunity to practice a variety of stress management techniques, such as deep abdominal breathing, progressive muscle relaxation, and/or guided imagery.  The purpose of this lesson is to educate patients on the role of stress in their lives and to provide healthy techniques for coping with it.  Learning Barriers/Preferences:  Learning Barriers/Preferences - 05/22/24 1336       Learning Barriers/Preferences   Learning Barriers Hearing    Learning Preferences Computer/Internet;Written Material          Education Topics:  Knowledge Questionnaire Score:  Knowledge Questionnaire Score - 05/22/24 1556       Knowledge Questionnaire Score   Pre Score 17/24          Core Components/Risk Factors/Patient Goals at Admission:  Personal Goals and Risk Factors at Admission - 05/22/24 1432       Core Components/Risk Factors/Patient Goals on Admission   Heart Failure Yes    Intervention Provide a combined exercise and nutrition program that is supplemented with education, support and counseling about heart failure. Directed toward relieving symptoms such as shortness of breath, decreased exercise tolerance, and extremity edema.    Expected Outcomes Improve functional capacity of life;Short term: Attendance in program 2-3 days a week with increased exercise capacity. Reported lower sodium intake. Reported increased fruit and vegetable intake. Reports medication compliance.;Short term: Daily weights obtained and reported for increase. Utilizing diuretic  protocols set by physician.;Long term: Adoption of self-care skills and reduction of barriers for early signs and symptoms recognition and intervention leading to self-care maintenance.    Lipids Yes    Intervention Provide education and support for participant on nutrition & aerobic/resistive exercise along with prescribed medications to achieve LDL 70mg , HDL >40mg .    Expected Outcomes Short Term: Participant states understanding of desired cholesterol values and is compliant with medications prescribed. Participant is following exercise prescription and nutrition guidelines.;Long Term: Cholesterol controlled with medications as prescribed, with individualized exercise RX and with personalized nutrition plan. Value goals: LDL < 70mg , HDL > 40 mg.          Core Components/Risk Factors/Patient Goals Review:   Goals and Risk Factor Review     Row Name 05/29/24 0805 06/08/24 1319 07/10/24 1558         Core Components/Risk Factors/Patient Goals Review   Personal Goals Review Weight Management/Obesity;Heart Failure;Lipids  Weight Management/Obesity;Heart Failure;Lipids Weight Management/Obesity;Heart Failure;Lipids     Review [name] started cardiac rehab on 05/28/24. [name] did fair with exercise. Initial; systolic BP was in the 90's. Patient reported feeling lightheaded. Symptoms resolved after getting water. BP's improved with activity [name] started cardiac rehab on 05/28/24. [name] is off to a good start to exercise. Recent vital signs have been stable. [name] is doing well with exercise at cardiac rehab.  Vital signs have been stable. [name] has lost 2.3 kg since starting the program.     Expected Outcomes [name] will continue to participate in cardiac rehab for exercise, nutrition and lifestyle modifications [name] will continue to participate in cardiac rehab for exercise, nutrition and lifestyle modifications [name] will continue to participate in cardiac rehab for exercise, nutrition and lifestyle  modifications        Core Components/Risk Factors/Patient Goals at Discharge (Final Review):   Goals and Risk Factor Review - 07/10/24 1558       Core Components/Risk Factors/Patient Goals Review   Personal Goals Review Weight Management/Obesity;Heart Failure;Lipids    Review [name] is doing well with exercise at cardiac rehab.  Vital signs have been stable. [name] has lost 2.3 kg since starting the program.    Expected Outcomes [name] will continue to participate in cardiac rehab for exercise, nutrition and lifestyle modifications          ITP Comments:  ITP Comments     Row Name 05/22/24 1319 05/29/24 0759 06/08/24 1310 07/10/24 1555     ITP Comments Medical Director- Dr. Wilbert Bihari, MD. Introduction to the [facility]in Education[facility]. Reviewed initial orientation folder with patient. 30 Day ITP Review. [name] started cardiac rehab on 05/28/24. [name] did fair with exercise for her fitness level. 30 Day ITP Review. [name] started cardiac rehab on 05/28/24. [name] did fair with exercise for her fitness level. 30 Day ITP Review. [name] has good attendance and participation with exercise at cardiac rehab       Comments: See ITP comments.[name] Dawn Quan RN BSN

## 2024-07-11 ENCOUNTER — Encounter (HOSPITAL_COMMUNITY)
Admission: RE | Admit: 2024-07-11 | Discharge: 2024-07-11 | Disposition: A | Discharge status: Home / Self Care | Source: Ambulatory Visit | Attending: Cardiology

## 2024-07-11 DIAGNOSIS — Z9889 Other specified postprocedural states: Secondary | ICD-10-CM | POA: Diagnosis not present

## 2024-07-13 ENCOUNTER — Encounter (HOSPITAL_COMMUNITY)
Admission: RE | Admit: 2024-07-13 | Discharge: 2024-07-13 | Disposition: A | Discharge status: Home / Self Care | Source: Ambulatory Visit | Attending: Cardiology

## 2024-07-13 DIAGNOSIS — Z9889 Other specified postprocedural states: Secondary | ICD-10-CM | POA: Diagnosis not present

## 2024-07-16 ENCOUNTER — Encounter (HOSPITAL_COMMUNITY)
Admission: RE | Admit: 2024-07-16 | Discharge: 2024-07-16 | Disposition: A | Discharge status: Home / Self Care | Source: Ambulatory Visit | Attending: Cardiology | Admitting: Cardiology

## 2024-07-16 DIAGNOSIS — Z9889 Other specified postprocedural states: Secondary | ICD-10-CM | POA: Diagnosis not present

## 2024-07-17 DIAGNOSIS — M810 Age-related osteoporosis without current pathological fracture: Secondary | ICD-10-CM | POA: Diagnosis not present

## 2024-07-18 ENCOUNTER — Encounter (HOSPITAL_COMMUNITY)
Admission: RE | Admit: 2024-07-18 | Discharge: 2024-07-18 | Disposition: A | Discharge status: Home / Self Care | Source: Ambulatory Visit | Attending: Cardiology

## 2024-07-18 DIAGNOSIS — Z9889 Other specified postprocedural states: Secondary | ICD-10-CM

## 2024-07-20 ENCOUNTER — Encounter (HOSPITAL_COMMUNITY)
Admission: RE | Admit: 2024-07-20 | Discharge: 2024-07-20 | Disposition: A | Discharge status: Home / Self Care | Source: Ambulatory Visit | Attending: Cardiology | Admitting: Cardiology

## 2024-07-20 DIAGNOSIS — Z9889 Other specified postprocedural states: Secondary | ICD-10-CM

## 2024-07-23 ENCOUNTER — Encounter (HOSPITAL_COMMUNITY)
Admission: RE | Admit: 2024-07-23 | Discharge: 2024-07-23 | Disposition: A | Discharge status: Home / Self Care | Source: Ambulatory Visit | Attending: Cardiology | Admitting: Cardiology

## 2024-07-23 DIAGNOSIS — Z9889 Other specified postprocedural states: Secondary | ICD-10-CM | POA: Diagnosis not present

## 2024-07-24 ENCOUNTER — Encounter: Payer: Self-pay | Admitting: Cardiology

## 2024-07-25 ENCOUNTER — Encounter (HOSPITAL_COMMUNITY)
Admission: RE | Admit: 2024-07-25 | Discharge: 2024-07-25 | Disposition: A | Discharge status: Home / Self Care | Source: Ambulatory Visit | Attending: Cardiology

## 2024-07-25 DIAGNOSIS — Z9889 Other specified postprocedural states: Secondary | ICD-10-CM

## 2024-07-27 ENCOUNTER — Encounter (HOSPITAL_COMMUNITY): Admission: RE | Admit: 2024-07-27 | Source: Ambulatory Visit

## 2024-07-30 ENCOUNTER — Encounter (HOSPITAL_COMMUNITY)
Admission: RE | Admit: 2024-07-30 | Discharge: 2024-07-30 | Disposition: A | Discharge status: Home / Self Care | Source: Ambulatory Visit | Attending: Cardiology | Admitting: Cardiology

## 2024-07-30 DIAGNOSIS — Z9889 Other specified postprocedural states: Secondary | ICD-10-CM | POA: Diagnosis not present

## 2024-07-31 DIAGNOSIS — S62325A Displaced fracture of shaft of fourth metacarpal bone, left hand, initial encounter for closed fracture: Secondary | ICD-10-CM | POA: Diagnosis not present

## 2024-08-01 ENCOUNTER — Encounter (HOSPITAL_COMMUNITY)
Admission: RE | Admit: 2024-08-01 | Discharge: 2024-08-01 | Disposition: A | Discharge status: Home / Self Care | Source: Ambulatory Visit | Attending: Cardiology | Admitting: Cardiology

## 2024-08-01 DIAGNOSIS — Z9889 Other specified postprocedural states: Secondary | ICD-10-CM | POA: Diagnosis not present

## 2024-08-03 ENCOUNTER — Encounter (HOSPITAL_COMMUNITY)
Admission: RE | Admit: 2024-08-03 | Discharge: 2024-08-03 | Disposition: A | Discharge status: Home / Self Care | Source: Ambulatory Visit | Attending: Cardiology | Admitting: Cardiology

## 2024-08-03 DIAGNOSIS — Z9889 Other specified postprocedural states: Secondary | ICD-10-CM | POA: Diagnosis not present

## 2024-08-06 ENCOUNTER — Encounter (HOSPITAL_COMMUNITY)
Admission: RE | Admit: 2024-08-06 | Discharge: 2024-08-06 | Disposition: A | Discharge status: Home / Self Care | Source: Ambulatory Visit | Attending: Cardiology | Admitting: Cardiology

## 2024-08-06 DIAGNOSIS — Z9889 Other specified postprocedural states: Secondary | ICD-10-CM | POA: Diagnosis not present

## 2024-08-08 ENCOUNTER — Encounter (HOSPITAL_COMMUNITY)
Admission: RE | Admit: 2024-08-08 | Discharge: 2024-08-08 | Disposition: A | Source: Ambulatory Visit | Attending: Cardiology

## 2024-08-08 DIAGNOSIS — Z9889 Other specified postprocedural states: Secondary | ICD-10-CM | POA: Diagnosis not present

## 2024-08-08 NOTE — Progress Notes (Signed)
 Cardiac Individual Treatment Plan  Patient Details  Name: [name] MRN: [id number] Date of Birth: 1940-11-27 Referring Provider:   Flowsheet Row INTENSIVE CARDIAC REHAB ORIENT from 05/22/2024 in [facility]  Referring Provider Ladona Heinz, MD    Initial Encounter Date:  Flowsheet Row INTENSIVE CARDIAC REHAB ORIENT from 05/22/2024 in [facility]  Date 05/22/24    Visit Diagnosis: S/P MVR (mitral valve repair)  S/P TVR (tricuspid valve repair)  Patient's Home Medications on Admission:  Current Outpatient Medications:    acetaminophen  (TYLENOL ) 500 MG tablet, Take 1-2 tablets (500-1,000 mg total) by mouth every 6 (six) hours as needed for mild pain (pain score 1-3) or fever. (Patient taking differently: Take 500-1,000 mg by mouth as needed for mild pain (pain score 1-3) or fever.), Disp: , Rfl:    amiodarone  (PACERONE ) 200 MG tablet, Take 1 tablet (200 mg total) by mouth daily., Disp: 90 tablet, Rfl: 3   amoxicillin (AMOXIL) 500 MG capsule, Take 500 mg by mouth as needed. Patient takes 4 capsule before dental procedures, Disp: , Rfl:    apixaban  (ELIQUIS ) 2.5 MG TABS tablet, Take 1 tablet (2.5 mg total) by mouth 2 (two) times daily., Disp: 60 tablet, Rfl: 11   atorvastatin  (LIPITOR) 10 MG tablet, Take 1 tablet (10 mg total) by mouth daily., Disp: 90 tablet, Rfl: 3   b complex vitamins tablet, Take 1 tablet by mouth daily.  , Disp: , Rfl:    CALCIUM  PO, Take 650 mg by mouth daily., Disp: , Rfl:    Cholecalciferol (VITAMIN D3) 125 MCG (5000 UT) CAPS, Take 5,000 Units by mouth daily., Disp: , Rfl:    denosumab  (PROLIA ) 60 MG/ML SOSY injection, Inject 60 mg into the skin every 6 (six) months., Disp: , Rfl:    gabapentin (NEURONTIN) 100 MG capsule, Take 100 mg by mouth 2 (two) times daily., Disp: , Rfl:    magnesium  gluconate (MAGONATE) 500 (27 Mg) MG TABS tablet, Take 1  tablet (500 mg total) by mouth daily., Disp: , Rfl:    Misc Natural Products (GLUCOSAMINE CHOND MSM FORMULA) TABS, Take 1 tablet by mouth as needed., Disp: , Rfl:    Multiple Vitamins-Minerals (MULTIVITAMINS THER. W/MINERALS) TABS, Take 1 tablet by mouth daily.  , Disp: , Rfl:    pantoprazole  (PROTONIX ) 40 MG tablet, Take 40 mg by mouth daily., Disp: , Rfl:    potassium chloride  SA (KLOR-CON  M) 20 MEQ tablet, Take 1 tablet (20 mEq total) by mouth 2 (two) times daily., Disp: 60 tablet, Rfl: 6   torsemide  (DEMADEX ) 20 MG tablet, Take 2 tablets (40 mg total) by mouth in the morning AND 1 tablet (20 mg total) every evening., Disp: 90 tablet, Rfl: 3   witch hazel-glycerin  (TUCKS) pad, Apply topically as needed for itching., Disp: , Rfl:    Zinc 50 MG TABS, Take 50 mg by mouth daily., Disp: , Rfl:   Past Medical History: Past Medical History:  Diagnosis Date   Acid reflux    Anemia    Arthritis    Atrial fibrillation (HCC)    Cataract    Diverticulosis of colon    Endometrial cancer (HCC)    1A grade endometrial ca   Essential hypertension 01/24/2009   Qualifier: Diagnosis of  By: Nelson-Smith CMA (AAMA), Dottie     Hx of radiation therapy 04/24/08-05/29/08& 8/12/,8/19,&06/27/08   external beam  through 7/09 ,then  intracavity in 06/2008   Hypertension    Mitral valve prolapse    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   Tricuspid regurgitation     Tobacco Use: Social History   Tobacco Use  Smoking Status Never  Smokeless Tobacco Never    Labs: Review Flowsheet  More data exists      Latest Ref Rng & Units 02/24/2024 02/25/2024 02/26/2024 02/27/2024 02/28/2024  Labs for ITP Cardiac and Pulmonary Rehab  O2 Saturation % 73.9  78  68.3  73.1  82.9     Capillary Blood Glucose: Lab Results  Component Value Date   GLUCAP 122 (H) 02/22/2024   GLUCAP 136 (H) 02/22/2024   GLUCAP 100 (H) 02/22/2024   GLUCAP 102 (H) 02/22/2024   GLUCAP 133 (H) 02/21/2024     Exercise Target  Goals: Exercise Program Goal: Individual exercise prescription set using results from initial 6 min walk test and THRR while considering  patient's activity barriers and safety.   Exercise Prescription Goal: Initial exercise prescription builds to 30-45 minutes a day of aerobic activity, 2-3 days per week.  Home exercise guidelines will be given to patient during program as part of exercise prescription that the participant will acknowledge.  Activity Barriers & Risk Stratification:  Activity Barriers & Cardiac Risk Stratification - 05/22/24 1333       Activity Barriers & Cardiac Risk Stratification   Activity Barriers Arthritis;Assistive Device;Balance Concerns;History of Falls    Cardiac Risk Stratification High          6 Minute Walk:  6 Minute Walk     Row Name 05/22/24 1454         6 Minute Walk   Phase Initial     Distance 747 feet     Walk Time 6 minutes     # of Rest Breaks 0     MPH 1.41     METS 1.25     RPE 11     Perceived Dyspnea  0     VO2 Peak 4.37     Symptoms No     Resting HR 75 bpm     Resting BP 90/50     Resting Oxygen Saturation  96 %     Exercise Oxygen Saturation  during 6 min walk 96 %     Max Ex. HR 88 bpm     Max Ex. BP 106/62     2 Minute Post BP 100/50        Oxygen Initial Assessment:   Oxygen Re-Evaluation:   Oxygen Discharge (Final Oxygen Re-Evaluation):   Initial Exercise Prescription:  Initial Exercise Prescription - 05/22/24 1500       Date of Initial Exercise RX and Referring Provider   Date 05/22/24    Referring Provider Ladona Heinz, MD    Expected Discharge Date 08/17/24      NuStep   Level 1    SPM 85    Minutes 15    METs 1.5      Track   Laps 9    Minutes 15    METs 2.15      Prescription Details   Frequency (times per week) 3    Duration Progress to 30 minutes of continuous aerobic without signs/symptoms of physical distress      Intensity   THRR 40-80% of Max Heartrate 56-111    Ratings of  Perceived Exertion 11-13    Perceived Dyspnea 0-4      Progression   Progression Continue to progress  workloads to maintain intensity without signs/symptoms of physical distress.      Resistance Training   Training Prescription Yes    Weight 2 lbs    Reps 10-15          Perform Capillary Blood Glucose checks as needed.  Exercise Prescription Changes:   Exercise Prescription Changes     Row Name 05/28/24 1651 06/11/24 1508 06/27/24 1642 07/13/24 1630 07/23/24 1617     Response to Exercise   Blood Pressure (Admit) 96/68 114/64 106/52 102/60 118/58   Blood Pressure (Exercise) 100/62 108/78 104/62 -- --   Blood Pressure (Exit) 114/62 110/60 100/52 100/50 100/58   Heart Rate (Admit) 77 bpm 80 bpm 81 bpm 70 bpm 78 bpm   Heart Rate (Exercise) 85 bpm 95 bpm 103 bpm 100 bpm 100 bpm   Heart Rate (Exit) 81 bpm 77 bpm 83 bpm 77 bpm 81 bpm   Rating of Perceived Exertion (Exercise) 13 13 12  11.5 11.5   Perceived Dyspnea (Exercise) 0 0 0 0 0   Symptoms Mild lightheaded pre exercise. Seated wts/stretches -- none none none   Comments Pt first day in the [facility] ICR prgram Reviewed home exercise guidelines and goals with [name]. Increased WL on recumbent stepper from 1 to 2. REVD MET's REVD MET's and goals REVD MET's   Duration Progress to 30 minutes of  aerobic without signs/symptoms of physical distress Continue with 30 min of aerobic exercise without signs/symptoms of physical distress. Continue with 30 min of aerobic exercise without signs/symptoms of physical distress. Continue with 30 min of aerobic exercise without signs/symptoms of physical distress. Continue with 30 min of aerobic exercise without signs/symptoms of physical distress.   Intensity THRR unchanged -- -- -- THRR unchanged     Progression   Progression Continue to progress workloads to maintain intensity without signs/symptoms of physical distress. Continue to progress workloads to maintain intensity without signs/symptoms of  physical distress. Continue to progress workloads to maintain intensity without signs/symptoms of physical distress. Continue to progress workloads to maintain intensity without signs/symptoms of physical distress. Continue to progress workloads to maintain intensity without signs/symptoms of physical distress.   Average METs 1.4 2.1 2.4 3.1 3.1     Resistance Training   Training Prescription Yes Yes No No No   Weight 2 lbs 2 lbs 2 lbs 2 lbs 2 lbs   Reps 10-15 10-15 10-15 10-15 10-15   Time 10 Minutes 10 Minutes 10 Minutes 10 Minutes 10 Minutes     NuStep   Level 1 2 2 2 2    SPM 47 70 89 92 94   Minutes 30 30 30 30 20    METs 1.4 2.1 2.4 3.1 3.2     Track   Laps -- -- -- -- 11   Minutes -- -- -- -- 10   METs -- -- -- -- 2.4     Home Exercise Plan   Plans to continue exercise at -- Home (comment)  Walking Home (comment)  Walking Home (comment)  Walking Home (comment)  Walking   Frequency -- Add 4 additional days to program exercise sessions. Add 4 additional days to program exercise sessions. Add 4 additional days to program exercise sessions. Add 4 additional days to program exercise sessions.   Initial Home Exercises Provided -- 06/11/24 06/11/24 06/11/24 06/11/24    Row Name 08/06/24 1600             Response to Exercise   Blood Pressure (Admit) 100/52  Blood Pressure (Exit) 110/60       Heart Rate (Admit) 60 bpm       Heart Rate (Exercise) 109 bpm       Heart Rate (Exit) 66 bpm       Rating of Perceived Exertion (Exercise) 11       Perceived Dyspnea (Exercise) 0       Symptoms none       Comments REVD MET and goals       Duration Continue with 30 min of aerobic exercise without signs/symptoms of physical distress.       Intensity THRR unchanged         Progression   Progression Continue to progress workloads to maintain intensity without signs/symptoms of physical distress.       Average METs 3.44         Resistance Training   Training Prescription Yes        Weight 2 lbs       Reps 10-15       Time 10 Minutes         NuStep   Level 2       SPM 102       Minutes 15       METs 3.7         Track   Laps 17       Minutes 15       METs 3.17         Home Exercise Plan   Plans to continue exercise at Home (comment)       Frequency Add 4 additional days to program exercise sessions.       Initial Home Exercises Provided 06/11/24          Exercise Comments:   Exercise Comments     Row Name 05/28/24 1656 06/11/24 1535 06/27/24 1644 07/05/24 0901 07/13/24 1630   Exercise Comments Pt first day in the [facility] ICR program. Pt tolerated exercise well with an average MET level of 1.4. Pt is off to a good start and is learning her THRR, RPE and ExRx. Pt mild lightheaded pre exercise, seated wts and stretches for comfort Reviewed home exercise guidelines and goals with [name]. Reviewed MET's. Patient tolerated exercise well with an average MET level of 2.4. She is working on increasing her SPM and has increased her time from 20 to 30 mins on the Nustep. Plans to add in track soon, but is doing very well overall and is increasing MET's and WL's REVD MET's and goals REVD MET's. Pt tolerated exercise well with an average MET level of 3.1. Pt  is doing well and is also feeling better about her balance    Row Name 07/23/24 1621 07/25/24 1620 08/06/24 1638       Exercise Comments REVD MET's and goals. Pt tolerated exercise well with an average MET level of 2.8. Pt has now started adding in 10 mins of walking with a rollator and is feeling good. Will switch to 15 mins as she gets comfortable. She said she walked the dog for the first time and felt good. She said ADL's are getting easier as well. Overall pt is doing well and is increasing her confidence and strength -- REVD MET's . Pt tolerated exercise well with an average MET level of 3.44. Pt has now started adding in 15 min walking the track with rollator, she is feeling more confident and is now able to achive  15 mins on the track  with no rest breaks. She is doing well and is feeling good with exercise        Exercise Goals and Review:   Exercise Goals     Row Name 05/22/24 1333             Exercise Goals   Increase Physical Activity Yes       Intervention Provide advice, education, support and counseling about physical activity/exercise needs.;Develop an individualized exercise prescription for aerobic and resistive training based on initial evaluation findings, risk stratification, comorbidities and participant's personal goals.       Expected Outcomes Short Term: Attend rehab on a regular basis to increase amount of physical activity.;Long Term: Exercising regularly at least 3-5 days a week.;Long Term: Add in home exercise to make exercise part of routine and to increase amount of physical activity.       Increase Strength and Stamina Yes       Intervention Provide advice, education, support and counseling about physical activity/exercise needs.;Develop an individualized exercise prescription for aerobic and resistive training based on initial evaluation findings, risk stratification, comorbidities and participant's personal goals.       Expected Outcomes Short Term: Increase workloads from initial exercise prescription for resistance, speed, and METs.;Short Term: Perform resistance training exercises routinely during rehab and add in resistance training at home;Long Term: Improve cardiorespiratory fitness, muscular endurance and strength as measured by increased METs and functional capacity ( )       Able to understand and use rate of perceived exertion (RPE) scale Yes       Intervention Provide education and explanation on how to use RPE scale       Expected Outcomes Short Term: Able to use RPE daily in rehab to express subjective intensity level;Long Term:  Able to use RPE to guide intensity level when exercising independently       Knowledge and understanding of Target Heart Rate Range  (THRR) Yes       Intervention Provide education and explanation of THRR including how the numbers were predicted and where they are located for reference       Expected Outcomes Short Term: Able to state/look up THRR;Long Term: Able to use THRR to govern intensity when exercising independently;Short Term: Able to use daily as guideline for intensity in rehab       Able to check pulse independently Yes       Intervention Provide education and demonstration on how to check pulse in carotid and radial arteries.;Review the importance of being able to check your own pulse for safety during independent exercise       Expected Outcomes Short Term: Able to explain why pulse checking is important during independent exercise;Long Term: Able to check pulse independently and accurately       Understanding of Exercise Prescription Yes       Intervention Provide education, explanation, and written materials on patient's individual exercise prescription       Expected Outcomes Short Term: Able to explain program exercise prescription;Long Term: Able to explain home exercise prescription to exercise independently          Exercise Goals Re-Evaluation :  Exercise Goals Re-Evaluation     Row Name 05/28/24 1655 06/11/24 1535 07/05/24 0848 07/23/24 1621 07/25/24 1618     Exercise Goal Re-Evaluation   Exercise Goals Review Increase Physical Activity;Understanding of Exercise Prescription;Increase Strength and Stamina;Knowledge and understanding of Target Heart Rate Range (THRR);Able to understand and use rate of perceived exertion (RPE) scale  Increase Physical Activity;Understanding of Exercise Prescription;Increase Strength and Stamina;Knowledge and understanding of Target Heart Rate Range (THRR);Able to understand and use rate of perceived exertion (RPE) scale Increase Physical Activity;Understanding of Exercise Prescription;Increase Strength and Stamina;Knowledge and understanding of Target Heart Rate Range  (THRR);Able to understand and use rate of perceived exertion (RPE) scale Increase Physical Activity;Understanding of Exercise Prescription;Increase Strength and Stamina;Knowledge and understanding of Target Heart Rate Range (THRR);Able to understand and use rate of perceived exertion (RPE) scale --   Comments Pt first day in the [facility] ICR program. Pt tolerated exercise well with an average MET level of 1.4. Pt is off to a good start and is learning her THRR, RPE and ExRx. Pt mild lightheaded pre exercise, seated wts and stretches for comfort Reviewed exercise prescription with [name]. She is walking daily and increasing distance as tolerated. We discussed gradually increasing walking to 30 minutes: 15 minutes twice daily or 10 minutes three times/day. Her goal is to increase leg strength. REVD MET's and goals. Pt tolerated exercise well with an average MET level of 2.7. Pt is doing well and making gradual increases, just started doing 15 mins at level 3 and has done well with the change, but doesnt feel able to keep up the total time REVD MET's and goals. Pt tolerated exercise well with an average MET level of 2.8. Pt has now started adding in 10 mins of walking with a rollator and is feeling good. Will switch to 15 mins as she gets comfortable. She said she walked the dog for the first time and felt good. She said ADL's are getting easier as well. Overall pt is doing well and is increasing her confidence and strength --   Expected Outcomes will continue to monitor pt and progress workloads as tolerated without sign or symptom Continue walking at home in addition to exercise at cardiac rehab to increase strength and improve balance. Will continue to monitor and increase WL's as tolerated Will continue to monitor and increase WL's as tolerated --      Discharge Exercise Prescription (Final Exercise Prescription Changes):  Exercise Prescription Changes - 08/06/24 1600       Response to Exercise   Blood  Pressure (Admit) 100/52    Blood Pressure (Exit) 110/60    Heart Rate (Admit) 60 bpm    Heart Rate (Exercise) 109 bpm    Heart Rate (Exit) 66 bpm    Rating of Perceived Exertion (Exercise) 11    Perceived Dyspnea (Exercise) 0    Symptoms none    Comments REVD MET and goals    Duration Continue with 30 min of aerobic exercise without signs/symptoms of physical distress.    Intensity THRR unchanged      Progression   Progression Continue to progress workloads to maintain intensity without signs/symptoms of physical distress.    Average METs 3.44      Resistance Training   Training Prescription Yes    Weight 2 lbs    Reps 10-15    Time 10 Minutes      NuStep   Level 2    SPM 102    Minutes 15    METs 3.7      Track   Laps 17    Minutes 15    METs 3.17      Home Exercise Plan   Plans to continue exercise at Home (comment)    Frequency Add 4 additional days to program exercise sessions.    Initial Home Exercises Provided  06/11/24          Nutrition:  Target Goals: Understanding of nutrition guidelines, daily intake of sodium 1500mg , cholesterol 200mg , calories 30% from fat and 7% or less from saturated fats, daily to have 5 or more servings of fruits and vegetables.  Biometrics:  Pre Biometrics - 05/22/24 1319       Pre Biometrics   Waist Circumference 31.13 inches    Hip Circumference 36.75 inches    Waist to Hip Ratio 0.85 %    Triceps Skinfold 8 mm    % Body Fat 29.6 %    Grip Strength 18 kg    Flexibility 13 in    Single Leg Stand --   Unable to perform          Nutrition Therapy Plan and Nutrition Goals:  Nutrition Therapy & Goals - 05/28/24 1554       Nutrition Therapy   Diet Heart healthy diet      Personal Nutrition Goals   Nutrition Goal Patient to identify strategies for reducing cardiovascular risk by attending the [facility] education and nutrition series weekly.    Personal Goal #2 Patient to improve diet quality by using the plate  method as a guide for meal planning to include lean protein/plant protein, fruits, vegetables, whole grains, nonfat dairy as part of a well-balanced diet.    Comments [name] has medical history of hypertension, hyperlipidemia, CKD stage III, diverticulosis, endometrial cancer in 2009 treated with surgery and radiation therapy, mitral valve prolapse and MvR, chronic dyspnea on exertion, iron  deficiency anemia. She is down ~25# since prior to surgery on 01/13/24 (158#). She reports improved appetite and is motivated to maintain weight at this time. Reviewed protein intake and protein sources. She continues to enjoy a wide variety of foods and follows a low sodium diet. Patient will benefit from participation in intensive cardiac rehab for nutrition education, exercise, and lifestyle modification.      Intervention Plan   Intervention Prescribe, educate and counsel regarding individualized specific dietary modifications aiming towards targeted core components such as weight, hypertension, lipid management, diabetes, heart failure and other comorbidities.;Nutrition handout(s) given to patient.    Expected Outcomes Short Term Goal: Understand basic principles of dietary content, such as calories, fat, sodium, cholesterol and nutrients.;Long Term Goal: Adherence to prescribed nutrition plan.          Nutrition Assessments:  MEDIFICTS Score Key: >=70 Need to make dietary changes  40-70 Heart Healthy Diet <= 40 Therapeutic Level Cholesterol Diet    Picture Your Plate Scores: <59 Unhealthy dietary pattern with much room for improvement. 41-50 Dietary pattern unlikely to meet recommendations for good health and room for improvement. 51-60 More healthful dietary pattern, with some room for improvement.  >60 Healthy dietary pattern, although there may be some specific behaviors that could be improved.    Nutrition Goals Re-Evaluation:  Nutrition Goals Re-Evaluation     Row Name 05/28/24 1554              Goals   Current Weight 132 lb 11.5 oz (60.2 kg)       Comment Lpa 107, A1c WNL, hemoglobin 10.9       Expected Outcome [name] has medical history of hypertension, hyperlipidemia, CKD stage III, diverticulosis, endometrial cancer in 2009 treated with surgery and radiation therapy, mitral valve prolapse and MvR, chronic dyspnea on exertion, iron  deficiency anemia. She is down ~25# since prior to surgery on 01/13/24 (158#). She reports improved appetite and is motivated  to maintain weight at this time. Reviewed protein intake and protein sources. She continues to enjoy a wide variety of foods and follows a low sodium diet. Patient will benefit from participation in intensive cardiac rehab for nutrition education, exercise, and lifestyle modification.          Nutrition Goals Re-Evaluation:  Nutrition Goals Re-Evaluation     Row Name 05/28/24 1554             Goals   Current Weight 132 lb 11.5 oz (60.2 kg)       Comment Lpa 107, A1c WNL, hemoglobin 10.9       Expected Outcome [name] has medical history of hypertension, hyperlipidemia, CKD stage III, diverticulosis, endometrial cancer in 2009 treated with surgery and radiation therapy, mitral valve prolapse and MvR, chronic dyspnea on exertion, iron  deficiency anemia. She is down ~25# since prior to surgery on 01/13/24 (158#). She reports improved appetite and is motivated to maintain weight at this time. Reviewed protein intake and protein sources. She continues to enjoy a wide variety of foods and follows a low sodium diet. Patient will benefit from participation in intensive cardiac rehab for nutrition education, exercise, and lifestyle modification.          Nutrition Goals Discharge (Final Nutrition Goals Re-Evaluation):  Nutrition Goals Re-Evaluation - 05/28/24 1554       Goals   Current Weight 132 lb 11.5 oz (60.2 kg)    Comment Lpa 107, A1c WNL, hemoglobin 10.9    Expected Outcome [name] has medical history of hypertension,  hyperlipidemia, CKD stage III, diverticulosis, endometrial cancer in 2009 treated with surgery and radiation therapy, mitral valve prolapse and MvR, chronic dyspnea on exertion, iron  deficiency anemia. She is down ~25# since prior to surgery on 01/13/24 (158#). She reports improved appetite and is motivated to maintain weight at this time. Reviewed protein intake and protein sources. She continues to enjoy a wide variety of foods and follows a low sodium diet. Patient will benefit from participation in intensive cardiac rehab for nutrition education, exercise, and lifestyle modification.          Psychosocial: Target Goals: Acknowledge presence or absence of significant depression and/or stress, maximize coping skills, provide positive support system. Participant is able to verbalize types and ability to use techniques and skills needed for reducing stress and depression.  Initial Review & Psychosocial Screening:  Initial Psych Review & Screening - 05/22/24 1431       Initial Review   Current issues with None Identified      Family Dynamics   Good Support System? Yes    Comments [name] has excellent support from her husband.      Barriers   Psychosocial barriers to participate in program There are no identifiable barriers or psychosocial needs.      Screening Interventions   Interventions Encouraged to exercise    Expected Outcomes Short Term goal: Identification and review with participant of any Quality of Life or Depression concerns found by scoring the questionnaire.;Long Term goal: The participant improves quality of Life and PHQ9 Scores as seen by post scores and/or verbalization of changes          Quality of Life Scores:  Quality of Life - 05/22/24 1556       Quality of Life   Select Quality of Life      Quality of Life Scores   Health/Function Pre 28.18 %    Socioeconomic Pre 30 %    Psych/Spiritual Pre 30 %  Family Pre 30 %    GLOBAL Pre 29.23 %         Scores  of 19 and below usually indicate a poorer quality of life in these areas.  A difference of  2-3 points is a clinically meaningful difference.  A difference of 2-3 points in the total score of the Quality of Life Index has been associated with significant improvement in overall quality of life, self-image, physical symptoms, and general health in studies assessing change in quality of life.  PHQ-9: Review Flowsheet       05/22/2024 12/20/2023  Depression screen PHQ 2/9  Decreased Interest 0 0  Down, Depressed, Hopeless 0 0  PHQ - 2 Score 0 0  Altered sleeping 3 -  Tired, decreased energy 1 -  Change in appetite 0 -  Feeling bad or failure about yourself  0 -  Trouble concentrating 0 -  Moving slowly or fidgety/restless 0 -  Suicidal thoughts 0 -  PHQ-9 Score 4 -  Difficult doing work/chores Not difficult at all -   Interpretation of Total Score  Total Score Depression Severity:  1-4 = Minimal depression, 5-9 = Mild depression, 10-14 = Moderate depression, 15-19 = Moderately severe depression, 20-27 = Severe depression   Psychosocial Evaluation and Intervention:   Psychosocial Re-Evaluation:  Psychosocial Re-Evaluation     Row Name 05/29/24 0804 06/08/24 1312 07/10/24 1555 08/08/24 0941       Psychosocial Re-Evaluation   Current issues with None Identified None Identified Current Sleep Concerns None Identified    Comments -- -- Reviewed PHQ9. Lilac says that her sleeping and energy level have improved since participating in cardiac rehab. [name] will tenatively complete cardiac rehab on 08/17/24    Interventions Encouraged to attend Cardiac Rehabilitation for the exercise Encouraged to attend Cardiac Rehabilitation for the exercise Encouraged to attend Cardiac Rehabilitation for the exercise Encouraged to attend Cardiac Rehabilitation for the exercise    Continue Psychosocial Services  No Follow up required No Follow up required No Follow up required No Follow up required        Psychosocial Discharge (Final Psychosocial Re-Evaluation):  Psychosocial Re-Evaluation - 08/08/24 0941       Psychosocial Re-Evaluation   Current issues with None Identified    Comments [name] will tenatively complete cardiac rehab on 08/17/24    Interventions Encouraged to attend Cardiac Rehabilitation for the exercise    Continue Psychosocial Services  No Follow up required          Vocational Rehabilitation: Provide vocational rehab assistance to qualifying candidates.   Vocational Rehab Evaluation & Intervention:  Vocational Rehab - 05/22/24 1432       Initial Vocational Rehab Evaluation & Intervention   Assessment shows need for Vocational Rehabilitation No      Vocational Rehab Re-Evaulation   Comments [name] is currently working and dosn'[name] have any vocational rehab needs at this time. Cleared to return to work on 06/01/24. Five hours, five days per week. Full-time in january.          Education: Education Goals: Education classes will be provided on a weekly basis, covering required topics. Participant will state understanding/return demonstration of topics presented.    Education     Row Name 05/28/24 1600     Education   Cardiac Education Topics Pritikin   Psychologist, forensic Psychosocial   Psychosocial Healthy Minds, Bodies, Hearts   Instruction Review  Code 1- Verbalizes Understanding   Class Start Time 1415   Class Stop Time 1450   Class Time Calculation (min) 35 min    Row Name 06/01/24 1400     Education   Cardiac Education Topics Pritikin   Select Workshops     Workshops   Educator Exercise Physiologist   Select Exercise   Exercise Workshop Location manager and Fall Prevention   Instruction Review Code 1- Verbalizes Understanding   Class Start Time 1402   Class Stop Time 1441   Class Time Calculation (min) 39 min    Row Name 06/04/24 1500     Education   Cardiac Education Topics  Pritikin   Glass blower/designer Nutrition   Nutrition Workshop Label Reading   Instruction Review Code 1- Tax inspector   Class Start Time 1400   Class Stop Time 1450   Class Time Calculation (min) 50 min    Row Name 06/06/24 1500     Education   Cardiac Education Topics Pritikin   Customer service manager   Weekly Topic Fast and Healthy Breakfasts   Instruction Review Code 1- Verbalizes Understanding   Class Start Time 1400   Class Stop Time 1440   Class Time Calculation (min) 40 min    Row Name 06/11/24 1400     Education   Cardiac Education Topics Pritikin   Hospital doctor Education   General Education Metabolic Syndrome and Belly Fat   Instruction Review Code 1- Verbalizes Understanding   Class Start Time 1403   Class Stop Time 1445   Class Time Calculation (min) 42 min    Row Name 06/13/24 1500     Education   Cardiac Education Topics Pritikin   Customer service manager   Weekly Topic Personalizing Your Pritikin Plate   Instruction Review Code 1- Verbalizes Understanding   Class Start Time 1400   Class Stop Time 1445   Class Time Calculation (min) 45 min    Row Name 06/18/24 1500     Education   Cardiac Education Topics Pritikin   Select Workshops     Workshops   Educator Exercise Physiologist   Select Psychosocial   Psychosocial Workshop Recognizing and Reducing Stress   Instruction Review Code 1- Verbalizes Understanding   Class Start Time 1405   Class Stop Time 1448   Class Time Calculation (min) 43 min    Row Name 06/20/24 1400     Education   Cardiac Education Topics Pritikin   Orthoptist   Educator Dietitian   Weekly Topic Delicious Desserts   Instruction Review Code 1- Verbalizes Understanding   Class  Start Time 1400   Class Stop Time 1450   Class Time Calculation (min) 50 min    Row Name 06/22/24 1500     Education   Licensed conveyancer Nutrition   Nutrition Calorie Density   Instruction Review Code 1- Verbalizes Understanding   Class Start Time 1400   Class Stop Time 1435   Class Time Calculation (min) 35 min    Row Name 06/25/24 1400     Education   Cardiac  Education Topics Pritikin   Geographical information systems officer Exercise   Exercise Workshop Exercise Basics: Diplomatic Services operational officer   Instruction Review Code 1- Verbalizes Understanding   Class Start Time 1420   Class Stop Time 1500   Class Time Calculation (min) 40 min    Row Name 06/27/24 1500     Education   Cardiac Education Topics Pritikin   [employer]    Row Name 06/29/24 1400     Education   Cardiac Education Topics Pritikin   Psychologist, forensic Exercise Education   Exercise Education Move It!   Instruction Review Code 1- Verbalizes Understanding   Class Start Time 1359   Class Stop Time 1432   Class Time Calculation (min) 33 min    Row Name 07/04/24 1400     Education   Cardiac Education Topics Pritikin   Customer service manager   Weekly Topic One-Pot Wonders   Instruction Review Code 1- Verbalizes Understanding   Class Start Time 1400   Class Stop Time 1440   Class Time Calculation (min) 40 min    Row Name 07/06/24 1500     Education   Cardiac Education Topics Pritikin   Writer General Education   General Education Hypertension and Heart Disease   Instruction Review Code 1- Verbalizes Understanding   Class Start Time 1403   Class Stop Time 1440   Class Time Calculation (min) 37 min    Row Name 07/09/24 1500     Education    Cardiac Education Topics Pritikin   Geographical information systems officer Psychosocial   Psychosocial Workshop Focused Goals, Sustainable Changes   Instruction Review Code 1- Verbalizes Understanding   Class Start Time 1359   Class Stop Time 1434   Class Time Calculation (min) 35 min    Row Name 07/11/24 1300     Education   Cardiac Education Topics Pritikin   Customer service manager   Weekly Topic Comforting Weekend Breakfasts   Instruction Review Code 1- Verbalizes Understanding   Class Start Time 1358   Class Stop Time 1439   Class Time Calculation (min) 41 min    Row Name 07/13/24 1300     Education   Cardiac Education Topics Pritikin   Nurse, children's Exercise Physiologist   Select Nutrition   Nutrition Dining Out - Part 1   Instruction Review Code 1- Verbalizes Understanding   Class Start Time 1400   Class Stop Time 1440   Class Time Calculation (min) 40 min    Row Name 07/16/24 1300     Education   Cardiac Education Topics Pritikin   Select Core Videos     Core Videos   Educator Exercise Physiologist   Select Exercise Education   Exercise Education Biomechanial Limitations   Instruction Review Code 1- Verbalizes Understanding   Class Start Time 1405   Class Stop Time 1440   Class Time Calculation (min) 35 min    Row Name 07/18/24 1300     Education   Cardiac Education  Topics Teacher, early years/pre   Weekly Topic Fast Evening Meals   Instruction Review Code 1- Verbalizes Understanding   Class Start Time 1356   Class Stop Time 1428   Class Time Calculation (min) 32 min    Row Name 07/20/24 1400     Education   Cardiac Education Topics Pritikin   Select Core Videos     Core Videos   Educator Dietitian   Select Nutrition   Nutrition Vitamins and Minerals    Instruction Review Code 1- Verbalizes Understanding   Class Start Time 1357   Class Stop Time 1440   Class Time Calculation (min) 43 min    Row Name 07/23/24 1300     Education   Cardiac Education Topics Pritikin   Psychologist, forensic Exercise Education   Exercise Education Improving Performance   Instruction Review Code 1- Verbalizes Understanding   Class Start Time 1400   Class Stop Time 1438   Class Time Calculation (min) 38 min    Row Name 07/25/24 1500     Education   Cardiac Education Topics Pritikin   Customer service manager   Weekly Topic International Cuisine- Spotlight on the [employer] Zones   Instruction Review Code 1- Verbalizes Understanding   Class Start Time 1358   Class Stop Time 1435   Class Time Calculation (min) 37 min    Row Name 07/30/24 1400     Education   Cardiac Education Topics Pritikin   Geographical information systems officer Psychosocial   Psychosocial Workshop Healthy Sleep for a Healthy Heart   Instruction Review Code 1- Verbalizes Understanding   Class Start Time 1400   Class Stop Time 1445   Class Time Calculation (min) 45 min    Row Name 08/01/24 1300     Education   Cardiac Education Topics Pritikin   Set designer Dietitian;Respiratory Therapist   Weekly Topic Simple Sides and Sauces   Instruction Review Code 1- Verbalizes Understanding   Class Start Time 1400   Class Stop Time 1435   Class Time Calculation (min) 35 min    Row Name 08/03/24 1300     Education   Nurse, children's Exercise Physiologist   Select Psychosocial   Psychosocial How Our Thoughts Can Heal Our Hearts   Instruction Review Code 1- Verbalizes Understanding   Class Start Time 1400   Class Stop Time 1436   Class Time Calculation (min) 36 min    Row  Name 08/06/24 1400     Education   Cardiac Education Topics Pritikin   Hospital doctor Education   General Education Heart Disease Risk Reduction   Instruction Review Code 1- Verbalizes Understanding   Class Start Time 1410   Class Stop Time 1450   Class Time Calculation (min) 40 min      Core Videos: Exercise    Move It!  Clinical staff conducted group or individual video education with verbal and written material and guidebook.  Patient learns the recommended Pritikin exercise program. Exercise with the  goal of living a long, healthy life. Some of the health benefits of exercise include controlled diabetes, healthier blood pressure levels, improved cholesterol levels, improved heart and lung capacity, improved sleep, and better body composition. Everyone should speak with their doctor before starting or changing an exercise routine.  Biomechanical Limitations Clinical staff conducted group or individual video education with verbal and written material and guidebook.  Patient learns how biomechanical limitations can impact exercise and how we can mitigate and possibly overcome limitations to have an impactful and balanced exercise routine.  Body Composition Clinical staff conducted group or individual video education with verbal and written material and guidebook.  Patient learns that body composition (ratio of muscle mass to fat mass) is a key component to assessing overall fitness, rather than body weight alone. Increased fat mass, especially visceral belly fat, can put us  at increased risk for metabolic syndrome, type 2 diabetes, heart disease, and even death. It is recommended to combine diet and exercise (cardiovascular and resistance training) to improve your body composition. Seek guidance from your physician and exercise physiologist before implementing an exercise routine.  Exercise Action Plan Clinical  staff conducted group or individual video education with verbal and written material and guidebook.  Patient learns the recommended strategies to achieve and enjoy long-term exercise adherence, including variety, self-motivation, self-efficacy, and positive decision making. Benefits of exercise include fitness, good health, weight management, more energy, better sleep, less stress, and overall well-being.  Medical   Heart Disease Risk Reduction Clinical staff conducted group or individual video education with verbal and written material and guidebook.  Patient learns our heart is our most vital organ as it circulates oxygen, nutrients, white blood cells, and hormones throughout the entire body, and carries waste away. Data supports a plant-based eating plan like the Pritikin Program for its effectiveness in slowing progression of and reversing heart disease. The video provides a number of recommendations to address heart disease.   Metabolic Syndrome and Belly Fat  Clinical staff conducted group or individual video education with verbal and written material and guidebook.  Patient learns what metabolic syndrome is, how it leads to heart disease, and how one can reverse it and keep it from coming back. You have metabolic syndrome if you have 3 of the following 5 criteria: abdominal obesity, high blood pressure, high triglycerides, low HDL cholesterol, and high blood sugar.  Hypertension and Heart Disease Clinical staff conducted group or individual video education with verbal and written material and guidebook.  Patient learns that high blood pressure, or hypertension, is very common in the [place] . Hypertension is largely due to excessive salt intake, but other important risk factors include being overweight, physical inactivity, drinking too much alcohol, smoking, and not eating enough potassium from fruits and vegetables. High blood pressure is a leading risk factor for heart attack, stroke,  congestive heart failure, dementia, kidney failure, and premature death. Long-term effects of excessive salt intake include stiffening of the arteries and thickening of heart muscle and organ damage. Recommendations include ways to reduce hypertension and the risk of heart disease.  Diseases of Our Time - Focusing on Diabetes Clinical staff conducted group or individual video education with verbal and written material and guidebook.  Patient learns why the best way to stop diseases of our time is prevention, through food and other lifestyle changes. Medicine (such as prescription pills and surgeries) is often only a Band-Aid on the problem, not a long-term solution. Most common diseases of our time include obesity,  type 2 diabetes, hypertension, heart disease, and cancer. The Pritikin Program is recommended and has been proven to help reduce, reverse, and/or prevent the damaging effects of metabolic syndrome.  Nutrition   Overview of the Pritikin Eating Plan  Clinical staff conducted group or individual video education with verbal and written material and guidebook.  Patient learns about the Pritikin Eating Plan for disease risk reduction. The Pritikin Eating Plan emphasizes a wide variety of unrefined, minimally-processed carbohydrates, like fruits, vegetables, whole grains, and legumes. Go, Caution, and Stop food choices are explained. Plant-based and lean animal proteins are emphasized. Rationale provided for low sodium intake for blood pressure control, low added sugars for blood sugar stabilization, and low added fats and oils for coronary artery disease risk reduction and weight management.  Calorie Density  Clinical staff conducted group or individual video education with verbal and written material and guidebook.  Patient learns about calorie density and how it impacts the Pritikin Eating Plan. Knowing the characteristics of the food you choose will help you decide whether those foods will lead  to weight gain or weight loss, and whether you want to consume more or less of them. Weight loss is usually a side effect of the Pritikin Eating Plan because of its focus on low calorie-dense foods.  Label Reading  Clinical staff conducted group or individual video education with verbal and written material and guidebook.  Patient learns about the Pritikin recommended label reading guidelines and corresponding recommendations regarding calorie density, added sugars, sodium content, and whole grains.  Dining Out - Part 1  Clinical staff conducted group or individual video education with verbal and written material and guidebook.  Patient learns that restaurant meals can be sabotaging because they can be so high in calories, fat, sodium, and/or sugar. Patient learns recommended strategies on how to positively address this and avoid unhealthy pitfalls.  Facts on Fats  Clinical staff conducted group or individual video education with verbal and written material and guidebook.  Patient learns that lifestyle modifications can be just as effective, if not more so, as many medications for lowering your risk of heart disease. A Pritikin lifestyle can help to reduce your risk of inflammation and atherosclerosis (cholesterol build-up, or plaque, in the artery walls). Lifestyle interventions such as dietary choices and physical activity address the cause of atherosclerosis. A review of the types of fats and their impact on blood cholesterol levels, along with dietary recommendations to reduce fat intake is also included.  Nutrition Action Plan  Clinical staff conducted group or individual video education with verbal and written material and guidebook.  Patient learns how to incorporate Pritikin recommendations into their lifestyle. Recommendations include planning and keeping personal health goals in mind as an important part of their success.  Healthy Mind-Set    Healthy Minds, Bodies, Hearts  Clinical  staff conducted group or individual video education with verbal and written material and guidebook.  Patient learns how to identify when they are stressed. Video will discuss the impact of that stress, as well as the many benefits of stress management. Patient will also be introduced to stress management techniques. The way we think, act, and feel has an impact on our hearts.  How Our Thoughts Can Heal Our Hearts  Clinical staff conducted group or individual video education with verbal and written material and guidebook.  Patient learns that negative thoughts can cause depression and anxiety. This can result in negative lifestyle behavior and serious health problems. Cognitive behavioral therapy is an  effective method to help control our thoughts in order to change and improve our emotional outlook.  Additional Videos:  Exercise    Improving Performance  Clinical staff conducted group or individual video education with verbal and written material and guidebook.  Patient learns to use a non-linear approach by alternating intensity levels and lengths of time spent exercising to help burn more calories and lose more body fat. Cardiovascular exercise helps improve heart health, metabolism, hormonal balance, blood sugar control, and recovery from fatigue. Resistance training improves strength, endurance, balance, coordination, reaction time, metabolism, and muscle mass. Flexibility exercise improves circulation, posture, and balance. Seek guidance from your physician and exercise physiologist before implementing an exercise routine and learn your capabilities and proper form for all exercise.  Introduction to Yoga  Clinical staff conducted group or individual video education with verbal and written material and guidebook.  Patient learns about yoga, a discipline of the coming together of mind, breath, and body. The benefits of yoga include improved flexibility, improved range of motion, better posture and  core strength, increased lung function, weight loss, and positive self-image. Yoga's heart health benefits include lowered blood pressure, healthier heart rate, decreased cholesterol and triglyceride levels, improved immune function, and reduced stress. Seek guidance from your physician and exercise physiologist before implementing an exercise routine and learn your capabilities and proper form for all exercise.  Medical   Aging: Enhancing Your Quality of Life  Clinical staff conducted group or individual video education with verbal and written material and guidebook.  Patient learns key strategies and recommendations to stay in good physical health and enhance quality of life, such as prevention strategies, having an advocate, securing a Health Care Proxy and Power of Attorney, and keeping a list of medications and system for tracking them. It also discusses how to avoid risk for bone loss.  Biology of Weight Control  Clinical staff conducted group or individual video education with verbal and written material and guidebook.  Patient learns that weight gain occurs because we consume more calories than we burn (eating more, moving less). Even if your body weight is normal, you may have higher ratios of fat compared to muscle mass. Too much body fat puts you at increased risk for cardiovascular disease, heart attack, stroke, type 2 diabetes, and obesity-related cancers. In addition to exercise, following the Pritikin Eating Plan can help reduce your risk.  Decoding Lab Results  Clinical staff conducted group or individual video education with verbal and written material and guidebook.  Patient learns that lab test reflects one measurement whose values change over time and are influenced by many factors, including medication, stress, sleep, exercise, food, hydration, pre-existing medical conditions, and more. It is recommended to use the knowledge from this video to become more involved with your lab  results and evaluate your numbers to speak with your doctor.   Diseases of Our Time - Overview  Clinical staff conducted group or individual video education with verbal and written material and guidebook.  Patient learns that according to the CDC, 50% to 70% of chronic diseases (such as obesity, type 2 diabetes, elevated lipids, hypertension, and heart disease) are avoidable through lifestyle improvements including healthier food choices, listening to satiety cues, and increased physical activity.  Sleep Disorders Clinical staff conducted group or individual video education with verbal and written material and guidebook.  Patient learns how good quality and duration of sleep are important to overall health and well-being. Patient also learns about sleep disorders and how they  impact health along with recommendations to address them, including discussing with a physician.  Nutrition  Dining Out - Part 2 Clinical staff conducted group or individual video education with verbal and written material and guidebook.  Patient learns how to plan ahead and communicate in order to maximize their dining experience in a healthy and nutritious manner. Included are recommended food choices based on the type of restaurant the patient is visiting.   Fueling a Banker conducted group or individual video education with verbal and written material and guidebook.  There is a strong connection between our food choices and our health. Diseases like obesity and type 2 diabetes are very prevalent and are in large-part due to lifestyle choices. The Pritikin Eating Plan provides plenty of food and hunger-curbing satisfaction. It is easy to follow, affordable, and helps reduce health risks.  Menu Workshop  Clinical staff conducted group or individual video education with verbal and written material and guidebook.  Patient learns that restaurant meals can sabotage health goals because they are often  packed with calories, fat, sodium, and sugar. Recommendations include strategies to plan ahead and to communicate with the manager, chef, or server to help order a healthier meal.  Planning Your Eating Strategy  Clinical staff conducted group or individual video education with verbal and written material and guidebook.  Patient learns about the Pritikin Eating Plan and its benefit of reducing the risk of disease. The Pritikin Eating Plan does not focus on calories. Instead, it emphasizes high-quality, nutrient-rich foods. By knowing the characteristics of the foods, we choose, we can determine their calorie density and make informed decisions.  Targeting Your Nutrition Priorities  Clinical staff conducted group or individual video education with verbal and written material and guidebook.  Patient learns that lifestyle habits have a tremendous impact on disease risk and progression. This video provides eating and physical activity recommendations based on your personal health goals, such as reducing LDL cholesterol, losing weight, preventing or controlling type 2 diabetes, and reducing high blood pressure.  Vitamins and Minerals  Clinical staff conducted group or individual video education with verbal and written material and guidebook.  Patient learns different ways to obtain key vitamins and minerals, including through a recommended healthy diet. It is important to discuss all supplements you take with your doctor.   Healthy Mind-Set    Smoking Cessation  Clinical staff conducted group or individual video education with verbal and written material and guidebook.  Patient learns that cigarette smoking and tobacco addiction pose a serious health risk which affects millions of people. Stopping smoking will significantly reduce the risk of heart disease, lung disease, and many forms of cancer. Recommended strategies for quitting are covered, including working with your doctor to develop a successful  plan.  Culinary   Becoming a Set designer conducted group or individual video education with verbal and written material and guidebook.  Patient learns that cooking at home can be healthy, cost-effective, quick, and puts them in control. Keys to cooking healthy recipes will include looking at your recipe, assessing your equipment needs, planning ahead, making it simple, choosing cost-effective seasonal ingredients, and limiting the use of added fats, salts, and sugars.  Cooking - Breakfast and Snacks  Clinical staff conducted group or individual video education with verbal and written material and guidebook.  Patient learns how important breakfast is to satiety and nutrition through the entire day. Recommendations include key foods to eat during breakfast to help stabilize  blood sugar levels and to prevent overeating at meals later in the day. Planning ahead is also a key component.  Cooking - Educational psychologist conducted group or individual video education with verbal and written material and guidebook.  Patient learns eating strategies to improve overall health, including an approach to cook more at home. Recommendations include thinking of animal protein as a side on your plate rather than center stage and focusing instead on lower calorie dense options like vegetables, fruits, whole grains, and plant-based proteins, such as beans. Making sauces in large quantities to freeze for later and leaving the skin on your vegetables are also recommended to maximize your experience.  Cooking - Healthy Salads and Dressing Clinical staff conducted group or individual video education with verbal and written material and guidebook.  Patient learns that vegetables, fruits, whole grains, and legumes are the foundations of the Pritikin Eating Plan. Recommendations include how to incorporate each of these in flavorful and healthy salads, and how to create homemade salad dressings.  Proper handling of ingredients is also covered. Cooking - Soups and [employer] - Soups and Desserts Clinical staff conducted group or individual video education with verbal and written material and guidebook.  Patient learns that Pritikin soups and desserts make for easy, nutritious, and delicious snacks and meal components that are low in sodium, fat, sugar, and calorie density, while high in vitamins, minerals, and filling fiber. Recommendations include simple and healthy ideas for soups and desserts.   Overview     The Pritikin Solution Program Overview Clinical staff conducted group or individual video education with verbal and written material and guidebook.  Patient learns that the results of the Pritikin Program have been documented in more than 100 articles published in peer-reviewed journals, and the benefits include reducing risk factors for (and, in some cases, even reversing) high cholesterol, high blood pressure, type 2 diabetes, obesity, and more! An overview of the three key pillars of the Pritikin Program will be covered: eating well, doing regular exercise, and having a healthy mind-set.  WORKSHOPS  Exercise: Exercise Basics: Building Your Action Plan Clinical staff led group instruction and group discussion with PowerPoint presentation and patient guidebook. To enhance the learning environment the use of posters, models and videos may be added. At the conclusion of this workshop, patients will comprehend the difference between physical activity and exercise, as well as the benefits of incorporating both, into their routine. Patients will understand the FITT (Frequency, Intensity, Time, and Type) principle and how to use it to build an exercise action plan. In addition, safety concerns and other considerations for exercise and cardiac rehab will be addressed by the presenter. The purpose of this lesson is to promote a comprehensive and effective weekly exercise routine in  order to improve patients' overall level of fitness.   Managing Heart Disease: Your Path to a Healthier Heart Clinical staff led group instruction and group discussion with PowerPoint presentation and patient guidebook. To enhance the learning environment the use of posters, models and videos may be added.At the conclusion of this workshop, patients will understand the anatomy and physiology of the heart. Additionally, they will understand how Pritikin's three pillars impact the risk factors, the progression, and the management of heart disease.  The purpose of this lesson is to provide a high-level overview of the heart, heart disease, and how the Pritikin lifestyle positively impacts risk factors.  Exercise Biomechanics Clinical staff led group instruction and group discussion with PowerPoint  presentation and patient guidebook. To enhance the learning environment the use of posters, models and videos may be added. Patients will learn how the structural parts of their bodies function and how these functions impact their daily activities, movement, and exercise. Patients will learn how to promote a neutral spine, learn how to manage pain, and identify ways to improve their physical movement in order to promote healthy living. The purpose of this lesson is to expose patients to common physical limitations that impact physical activity. Participants will learn practical ways to adapt and manage aches and pains, and to minimize their effect on regular exercise. Patients will learn how to maintain good posture while sitting, walking, and lifting.  Balance Training and Fall Prevention  Clinical staff led group instruction and group discussion with PowerPoint presentation and patient guidebook. To enhance the learning environment the use of posters, models and videos may be added. At the conclusion of this workshop, patients will understand the importance of their sensorimotor skills (vision,  proprioception, and the vestibular system) in maintaining their ability to balance as they age. Patients will apply a variety of balancing exercises that are appropriate for their current level of function. Patients will understand the common causes for poor balance, possible solutions to these problems, and ways to modify their physical environment in order to minimize their fall risk. The purpose of this lesson is to teach patients about the importance of maintaining balance as they age and ways to minimize their risk of falling.  WORKSHOPS   Nutrition:  Fueling a Ship broker led group instruction and group discussion with PowerPoint presentation and patient guidebook. To enhance the learning environment the use of posters, models and videos may be added. Patients will review the foundational principles of the Pritikin Eating Plan and understand what constitutes a serving size in each of the food groups. Patients will also learn Pritikin-friendly foods that are better choices when away from home and review make-ahead meal and snack options. Calorie density will be reviewed and applied to three nutrition priorities: weight maintenance, weight loss, and weight gain. The purpose of this lesson is to reinforce (in a group setting) the key concepts around what patients are recommended to eat and how to apply these guidelines when away from home by planning and selecting Pritikin-friendly options. Patients will understand how calorie density may be adjusted for different weight management goals.  Mindful Eating  Clinical staff led group instruction and group discussion with PowerPoint presentation and patient guidebook. To enhance the learning environment the use of posters, models and videos may be added. Patients will briefly review the concepts of the Pritikin Eating Plan and the importance of low-calorie dense foods. The concept of mindful eating will be introduced as well as the  importance of paying attention to internal hunger signals. Triggers for non-hunger eating and techniques for dealing with triggers will be explored. The purpose of this lesson is to provide patients with the opportunity to review the basic principles of the Pritikin Eating Plan, discuss the value of eating mindfully and how to measure internal cues of hunger and fullness using the Hunger Scale. Patients will also discuss reasons for non-hunger eating and learn strategies to use for controlling emotional eating.  Targeting Your Nutrition Priorities Clinical staff led group instruction and group discussion with PowerPoint presentation and patient guidebook. To enhance the learning environment the use of posters, models and videos may be added. Patients will learn how to determine their genetic susceptibility to disease  by reviewing their family history. Patients will gain insight into the importance of diet as part of an overall healthy lifestyle in mitigating the impact of genetics and other environmental insults. The purpose of this lesson is to provide patients with the opportunity to assess their personal nutrition priorities by looking at their family history, their own health history and current risk factors. Patients will also be able to discuss ways of prioritizing and modifying the Pritikin Eating Plan for their highest risk areas  Menu  Clinical staff led group instruction and group discussion with PowerPoint presentation and patient guidebook. To enhance the learning environment the use of posters, models and videos may be added. Using menus brought in from [employer], or printed from [employer], patients will apply the Pritikin dining out guidelines that were presented in the [employer] video. Patients will also be able to practice these guidelines in a variety of provided scenarios. The purpose of this lesson is to provide patients with the opportunity to practice  hands-on learning of the Pritikin Dining Out guidelines with actual menus and practice scenarios.  Label Reading Clinical staff led group instruction and group discussion with PowerPoint presentation and patient guidebook. To enhance the learning environment the use of posters, models and videos may be added. Patients will review and discuss the Pritikin label reading guidelines presented in Pritikin's Label Reading Educational series video. Using fool labels brought in from local grocery stores and markets, patients will apply the label reading guidelines and determine if the packaged food meet the Pritikin guidelines. The purpose of this lesson is to provide patients with the opportunity to review, discuss, and practice hands-on learning of the Pritikin Label Reading guidelines with actual packaged food labels. Cooking School  Pritikin's [employer] are designed to teach patients ways to prepare quick, simple, and affordable recipes at home. The importance of nutrition's role in chronic disease risk reduction is reflected in its emphasis in the overall Pritikin program. By learning how to prepare essential core Pritikin Eating Plan recipes, patients will increase control over what they eat; be able to customize the flavor of foods without the use of added salt, sugar, or fat; and improve the quality of the food they consume. By learning a set of core recipes which are easily assembled, quickly prepared, and affordable, patients are more likely to prepare more healthy foods at home. These workshops focus on convenient breakfasts, simple entres, side dishes, and desserts which can be prepared with minimal effort and are consistent with nutrition recommendations for cardiovascular risk reduction. Cooking [employer] are taught by a Armed forces logistics/support/administrative officer (RD) who has been trained by the [employer]. The chef or RD has a clear understanding of the importance of minimizing -  if not completely eliminating - added fat, sugar, and sodium in recipes. Throughout the series of Cooking School Workshop sessions, patients will learn about healthy ingredients and efficient methods of cooking to build confidence in their capability to prepare    Cooking School weekly topics:  Adding Flavor- Sodium-Free  Fast and Healthy Breakfasts  Powerhouse Plant-Based Proteins  Satisfying Salads and Dressings  Simple Sides and Sauces  International Cuisine-Spotlight on the [employer] Zones  Delicious Desserts  Savory Soups  [employer] - Meals in a Astronomer Appetizers and Snacks  Comforting Weekend Breakfasts  One-Pot Wonders   Fast Evening Meals  Landscape architect Your Pritikin Plate  WORKSHOPS   Healthy Mindset (Psychosocial):  Focused Goals, Sustainable Changes Clinical staff led group instruction and group discussion with PowerPoint presentation and patient guidebook. To enhance the learning environment the use of posters, models and videos may be added. Patients will be able to apply effective goal setting strategies to establish at least one personal goal, and then take consistent, meaningful action toward that goal. They will learn to identify common barriers to achieving personal goals and develop strategies to overcome them. Patients will also gain an understanding of how our mind-set can impact our ability to achieve goals and the importance of cultivating a positive and growth-oriented mind-set. The purpose of this lesson is to provide patients with a deeper understanding of how to set and achieve personal goals, as well as the tools and strategies needed to overcome common obstacles which may arise along the way.  From Head to Heart: The Power of a Healthy Outlook  Clinical staff led group instruction and group discussion with PowerPoint presentation and patient guidebook. To enhance the learning environment the use of posters, models and videos may be  added. Patients will be able to recognize and describe the impact of emotions and mood on physical health. They will discover the importance of self-care and explore self-care practices which may work for them. Patients will also learn how to utilize the 4 C's to cultivate a healthier outlook and better manage stress and challenges. The purpose of this lesson is to demonstrate to patients how a healthy outlook is an essential part of maintaining good health, especially as they continue their cardiac rehab journey.  Healthy Sleep for a Healthy Heart Clinical staff led group instruction and group discussion with PowerPoint presentation and patient guidebook. To enhance the learning environment the use of posters, models and videos may be added. At the conclusion of this workshop, patients will be able to demonstrate knowledge of the importance of sleep to overall health, well-being, and quality of life. They will understand the symptoms of, and treatments for, common sleep disorders. Patients will also be able to identify daytime and nighttime behaviors which impact sleep, and they will be able to apply these tools to help manage sleep-related challenges. The purpose of this lesson is to provide patients with a general overview of sleep and outline the importance of quality sleep. Patients will learn about a few of the most common sleep disorders. Patients will also be introduced to the concept of "sleep hygiene," and discover ways to self-manage certain sleeping problems through simple daily behavior changes. Finally, the workshop will motivate patients by clarifying the links between quality sleep and their goals of heart-healthy living.   Recognizing and Reducing Stress Clinical staff led group instruction and group discussion with PowerPoint presentation and patient guidebook. To enhance the learning environment the use of posters, models and videos may be added. At the conclusion of this workshop, patients  will be able to understand the types of stress reactions, differentiate between acute and chronic stress, and recognize the impact that chronic stress has on their health. They will also be able to apply different coping mechanisms, such as reframing negative self-talk. Patients will have the opportunity to practice a variety of stress management techniques, such as deep abdominal breathing, progressive muscle relaxation, and/or guided imagery.  The purpose of this lesson is to educate patients on the role of stress in their lives and to provide healthy techniques for coping with it.  Learning Barriers/Preferences:  Learning Barriers/Preferences - 05/22/24 1336       Learning Barriers/Preferences  Learning Barriers Hearing    Learning Preferences Computer/Internet;Written Material          Education Topics:  Knowledge Questionnaire Score:  Knowledge Questionnaire Score - 05/22/24 1556       Knowledge Questionnaire Score   Pre Score 17/24          Core Components/Risk Factors/Patient Goals at Admission:  Personal Goals and Risk Factors at Admission - 05/22/24 1432       Core Components/Risk Factors/Patient Goals on Admission   Heart Failure Yes    Intervention Provide a combined exercise and nutrition program that is supplemented with education, support and counseling about heart failure. Directed toward relieving symptoms such as shortness of breath, decreased exercise tolerance, and extremity edema.    Expected Outcomes Improve functional capacity of life;Short term: Attendance in program 2-3 days a week with increased exercise capacity. Reported lower sodium intake. Reported increased fruit and vegetable intake. Reports medication compliance.;Short term: Daily weights obtained and reported for increase. Utilizing diuretic protocols set by physician.;Long term: Adoption of self-care skills and reduction of barriers for early signs and symptoms recognition and intervention leading  to self-care maintenance.    Lipids Yes    Intervention Provide education and support for participant on nutrition & aerobic/resistive exercise along with prescribed medications to achieve LDL 70mg , HDL >40mg .    Expected Outcomes Short Term: Participant states understanding of desired cholesterol values and is compliant with medications prescribed. Participant is following exercise prescription and nutrition guidelines.;Long Term: Cholesterol controlled with medications as prescribed, with individualized exercise RX and with personalized nutrition plan. Value goals: LDL < 70mg , HDL > 40 mg.          Core Components/Risk Factors/Patient Goals Review:   Goals and Risk Factor Review     Row Name 05/29/24 0805 06/08/24 1319 07/10/24 1558 08/08/24 0943       Core Components/Risk Factors/Patient Goals Review   Personal Goals Review Weight Management/Obesity;Heart Failure;Lipids Weight Management/Obesity;Heart Failure;Lipids Weight Management/Obesity;Heart Failure;Lipids Weight Management/Obesity;Heart Failure;Lipids    Review [name] started cardiac rehab on 05/28/24. [name] did fair with exercise. Initial; systolic BP was in the 90's. Patient reported feeling lightheaded. Symptoms resolved after getting water. BP's improved with activity [name] started cardiac rehab on 05/28/24. [name] is off to a good start to exercise. Recent vital signs have been stable. [name] is doing well with exercise at cardiac rehab.  Vital signs have been stable. [name] has lost 2.3 kg since starting the program. [name] continues to  do very well with exercise at cardiac rehab.  Vital signs have been stable. [name] has lost 2.8 kg since starting the program. [name] is now walking on the track and has returned to work.    Expected Outcomes [name] will continue to participate in cardiac rehab for exercise, nutrition and lifestyle modifications [name] will continue to participate in cardiac rehab for exercise, nutrition and lifestyle modifications  [name] will continue to participate in cardiac rehab for exercise, nutrition and lifestyle modifications [name] will continue to participate in cardiac rehab for exercise, nutrition and lifestyle modifications       Core Components/Risk Factors/Patient Goals at Discharge (Final Review):   Goals and Risk Factor Review - 08/08/24 0943       Core Components/Risk Factors/Patient Goals Review   Personal Goals Review Weight Management/Obesity;Heart Failure;Lipids    Review [name] continues to  do very well with exercise at cardiac rehab.  Vital signs have been stable. [name] has lost 2.8 kg since starting the program. [name] is  now walking on the track and has returned to work.    Expected Outcomes Tegan will continue to participate in cardiac rehab for exercise, nutrition and lifestyle modifications          ITP Comments:  ITP Comments     Row Name 05/22/24 1319 05/29/24 0759 06/08/24 1310 07/10/24 1555 08/08/24 0940   ITP Comments Medical Director- Dr. Wilbert Bihari, MD. Introduction to the [facility]in Education/ [facility]. Reviewed initial orientation folder with patient. 30 Day ITP Review. Ranee started cardiac rehab on 05/28/24. Shimkus did fair with exercise for her fitness level. 30 Day ITP Review. Giara started cardiac rehab on 05/28/24. [place]over did fair with exercise for her fitness level. 30 Day ITP Review. Suhey has good attendance and participation with exercise at cardiac rehab 30 Day ITP Review. Phillippa continues to have good attendance and participation with exercise at cardiac rehab. Verbena will tenatively complete cardiac rehab on 08/17/24      Comments: See ITP comments.[name] Elpidio Quan RN BSN

## 2024-08-10 ENCOUNTER — Encounter (HOSPITAL_COMMUNITY)
Admission: RE | Admit: 2024-08-10 | Discharge: 2024-08-10 | Disposition: A | Discharge status: Home / Self Care | Source: Ambulatory Visit | Attending: Cardiology | Admitting: Cardiology

## 2024-08-13 ENCOUNTER — Encounter (HOSPITAL_COMMUNITY)
Admission: RE | Admit: 2024-08-13 | Discharge: 2024-08-13 | Disposition: A | Discharge status: Home / Self Care | Source: Ambulatory Visit | Attending: Cardiology | Admitting: Cardiology

## 2024-08-13 DIAGNOSIS — Z9889 Other specified postprocedural states: Secondary | ICD-10-CM | POA: Diagnosis not present

## 2024-08-15 ENCOUNTER — Encounter (HOSPITAL_COMMUNITY)
Admission: RE | Admit: 2024-08-15 | Discharge: 2024-08-15 | Disposition: A | Discharge status: Home / Self Care | Source: Ambulatory Visit | Attending: Cardiology | Admitting: Cardiology

## 2024-08-15 VITALS — Ht 64.0 in | Wt 123.7 lb

## 2024-08-15 DIAGNOSIS — Z9889 Other specified postprocedural states: Secondary | ICD-10-CM | POA: Diagnosis not present

## 2024-08-17 ENCOUNTER — Encounter (HOSPITAL_COMMUNITY)
Admission: RE | Admit: 2024-08-17 | Discharge: 2024-08-17 | Disposition: A | Discharge status: Home / Self Care | Source: Ambulatory Visit | Attending: Cardiology | Admitting: Cardiology

## 2024-08-17 DIAGNOSIS — Z9889 Other specified postprocedural states: Secondary | ICD-10-CM

## 2024-08-20 ENCOUNTER — Encounter (HOSPITAL_COMMUNITY)
Admission: RE | Admit: 2024-08-20 | Discharge: 2024-08-20 | Disposition: A | Discharge status: Home / Self Care | Source: Ambulatory Visit | Attending: Cardiology | Admitting: Cardiology

## 2024-08-20 ENCOUNTER — Ambulatory Visit: Attending: Cardiology | Admitting: Cardiology

## 2024-08-20 ENCOUNTER — Encounter: Payer: Self-pay | Admitting: Cardiology

## 2024-08-20 VITALS — BP 95/54 | HR 65 | Resp 18 | Ht 64.0 in | Wt 117.1 lb

## 2024-08-20 DIAGNOSIS — I5032 Chronic diastolic (congestive) heart failure: Secondary | ICD-10-CM | POA: Diagnosis not present

## 2024-08-20 DIAGNOSIS — I480 Paroxysmal atrial fibrillation: Secondary | ICD-10-CM

## 2024-08-20 DIAGNOSIS — Z9889 Other specified postprocedural states: Secondary | ICD-10-CM

## 2024-08-20 NOTE — Progress Notes (Signed)
 Cardiology Office Note:  .   Date:  08/20/2024  ID:  [name], DOB [birth year], MRN [id number] PCP: Clarice Nottingham, MD  [facility] Providers Cardiologist:  Gordy Bergamo, MD   History of Present Illness: .   [name] is a 83 y.o. 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  Overall she eventually recovered well and she had developed drop in her EF to 35%.  She is presently on amiodarone  and has been maintaining sinus rhythm and echocardiogram on 06/12/2024 fortunately reveals normalization of LVEF.  She is maintaining sinus rhythm.  She remains asymptomatic presently.  She is presently still working 4-5 hours a day and would like to return to full hours of work.  Cardiac Studies relevent.    ECHOCARDIOGRAM COMPLETE 06/12/2024  Normal LV systolic function, EF 55 to 60%. Mitral valve has been repaired.  Mitral regurgitation is mild.  Mean gradient is 3 mmHg. There is a 30 mm Simulus prosthetic annuloplasty ring present in the mitral position. Procedure Date: 02/08/2024 Moderate tricuspid regurgitation. 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     Discussed the use of AI scribe software for clinical note transcription with the patient, who gave verbal consent to proceed.  History of Present Illness [name] is an 83 year old female with chronic diastolic heart failure and atrial fibrillation who presents for follow-up after cardiac rehabilitation.  She is progressing well in cardiac rehabilitation and plans to graduate on Friday. She is considering returning to full-time work. Her atrial fibrillation is managed with amiodarone , and she has not  experienced recent episodes. She takes torsemide , adjusting the dose based on swelling, and is on Eliquis , atorvastatin , and supplements with potassium and magnesium . Her cholesterol levels in January 2025 were within target range. She reports stable blood pressure and heart rate with no dizziness.  Labs   Lab Results  Component Value Date   CHOL 104 04/29/2021   HDL 38 (L) 04/29/2021   LDLCALC 39 04/29/2021   TRIG 157 (H) 04/29/2021   Lipoprotein (a)  Date/Time Value Ref Range Status  12/31/2023 04:30 AM 107.6 (H) <75.0 nmol/L Final    Comment:    (NOTE) Note:  Values greater than or equal to 75.0 nmol/L may       indicate an independent risk factor for CHD,       but must be evaluated with caution when applied       to non-Caucasian populations due to the       influence of genetic factors on Lp(a) across       ethnicities. Performed At: [facility] [address] [place], [place] [id number] Jennette Shorter MD Ey:[id number]     Recent Labs    04/05/24 1026 04/13/24 1344 06/12/24 1623 06/26/24 1416  NA 133* 129* 138 134*  K 4.9 3.6 3.4* 4.5  CL 105 86* 101 100  CO2 18* 22 28 23   GLUCOSE 88 90 100* 109*  BUN 27* 38* 21 20  CREATININE 1.64* 1.71* 1.26* 1.42*  CALCIUM  8.6* 9.0 8.7* 7.3*  GFRNONAA 31*  --  43* 37*    Lab Results  Component Value Date   ALT 33 (H) 04/13/2024   AST 43 (H) 04/13/2024  ALKPHOS 101 04/13/2024   BILITOT 1.2 04/13/2024      Latest Ref Rng & Units 05/29/2024   11:01 AM 04/13/2024    1:44 PM 04/05/2024   10:26 AM  CBC  WBC 4.0 - 10.5 K/uL 6.1  9.4  8.8   Hemoglobin 12.0 - 15.0 g/dL 8.3  89.0  89.6   Hematocrit 36.0 - 46.0 % 25.4  34.8  33.0   Platelets 150 - 400 K/uL 206  291  229    Lab Results  Component Value Date   HGBA1C 4.7 (L) 02/07/2024    Lab Results  Component Value Date   TSH 6.010 (H) 04/13/2024   ROS  Review of Systems  Cardiovascular:  Negative for chest pain, dyspnea on exertion and leg swelling.    Physical Exam:   VS:  BP (!) 95/54 (BP Location: Left Arm, Patient Position: Sitting, Cuff Size: Normal)   Pulse 65   Resp 18   Ht 5' 4 (1.626 m)   Wt 117 lb 1.6 oz (53.1 kg)   SpO2 98%   BMI 20.10 kg/m    Wt Readings from Last 3 Encounters:  08/20/24 117 lb 1.6 oz (53.1 kg)  08/16/24 123 lb 10.9 oz (56.1 kg)  07/04/24 128 lb 12.8 oz (58.4 kg)    BP Readings from Last 3 Encounters:  08/20/24 (!) 95/54  07/04/24 111/69  06/26/24 98/62   Physical Exam Neck:     Vascular: No carotid bruit or JVD.  Cardiovascular:     Rate and Rhythm: Normal rate and regular rhythm.     Pulses: Intact distal pulses.     Heart sounds: Normal heart sounds. No murmur heard.    No gallop.  Pulmonary:     Effort: Pulmonary effort is normal.     Breath sounds: Normal breath sounds.  Abdominal:     General: Bowel sounds are normal.     Palpations: Abdomen is soft.  Musculoskeletal:     Right lower leg: No edema.     Left lower leg: No edema.    EKG:         ASSESSMENT AND PLAN: .      ICD-10-CM   1. Paroxysmal atrial fibrillation (HCC)  I48.0 amiodarone  (PACERONE ) 200 MG tablet    2. MV repair (30 mm simulus StJude ring), maze and left atrial appendage clipping (PENDITURE LAAC40) on 02/08/2024  Z98.890     3. Chronic diastolic heart failure (HCC)  P49.67 torsemide  (DEMADEX ) 20 MG tablet     Assessment & Plan Paroxysmal atrial fibrillation status post mitral valve repair Paroxysmal atrial fibrillation is well-controlled post mitral valve repair. Last episode occurred during a stay at the rehab center. Currently on amiodarone , which is effective in controlling AFib. The mitral valve repair has improved cardiac function, reducing the likelihood of AFib recurrence. Reducing amiodarone  dosage is intended to minimize side effects and improve thyroid  function. - Reduce amiodarone  to 100 mg by cutting the current dose in half. - Continue Eliquis  2.5 mg twice a day. - Cancel AFib clinic  appointments as management is being handled by the current provider.  History of chronic diastolic heart failure, currently compensated Chronic diastolic heart failure is currently compensated with no clinical signs of heart failure. Echocardiogram shows normal heart function. Blood pressure is stable, but there is a need to adjust diuretic therapy to prevent hypotension. Reducing torsemide  dosage aims to prevent blood pressure drops while managing leg swelling. - Reduce torsemide  to 20 mg once a  day in the morning. - Allow for up to two extra tablets of torsemide  as needed for leg swelling. - Monitor for signs of increased swelling and adjust torsemide  dosage accordingly.  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 Reducing amiodarone  dosage is expected to improve thyroid  function.  Well-controlled hyperlipidemia Hyperlipidemia is well-controlled with atorvastatin . Recent cholesterol levels are within target range. - Continue atorvastatin  10 mg once a day.   Follow up: 6 months for Chronic diastolic heart failure and AF.   Signed,  Gordy Bergamo, MD, [facility] 08/20/2024, 8:16 PM [facility] [address] [place], [place] [address] Phone: [phone]. Fax:  [phone]

## 2024-08-20 NOTE — Patient Instructions (Addendum)
 Medication Instructions:  Your physician has recommended you make the following change in your medication:  Decrease amiodarone  100 mg daily Decrease torsemide  20 mg daily. May take up to 2 extra tablets daily as needed.  Lab Work: None ordered.  You may go to any [facility] Location for your lab work:  [facility] - [address] ([facility]) - [address] [phone] [address]  [place] - [address]   [place]  - [address]   [place] - [address] - [address] [employer]  - [address] - [address] ([facility]   If you have labs (blood work) drawn today and your tests are completely normal, you will receive your results only by: [employer] (if you have MyChart)  If you have any lab test that is abnormal or we need to change your treatment, we will call you or send a MyChart message to review the results.  Testing/Procedures: None ordered.  Follow-Up: At [facility], you and your health needs are our priority.  As part of our continuing mission to provide you with exceptional heart care, we have created designated Provider Care Teams.  These Care Teams include your primary Cardiologist (physician) and Advanced Practice Providers (APPs -  Physician Assistants and Nurse Practitioners) who all work together to provide you with the care you need, when you need it.   Your next appointment:   6 months  The format for your next appointment:   In Person  Provider:   Gordy Bergamo, MD

## 2024-08-21 ENCOUNTER — Encounter: Payer: Self-pay | Admitting: Cardiology

## 2024-08-22 ENCOUNTER — Encounter (HOSPITAL_COMMUNITY): Admission: RE | Admit: 2024-08-22 | Discharge: 2024-08-22 | Attending: Cardiology

## 2024-08-22 DIAGNOSIS — Z9889 Other specified postprocedural states: Secondary | ICD-10-CM | POA: Diagnosis not present

## 2024-08-24 ENCOUNTER — Encounter (HOSPITAL_COMMUNITY)
Admission: RE | Admit: 2024-08-24 | Discharge: 2024-08-24 | Disposition: A | Discharge status: Home / Self Care | Source: Ambulatory Visit | Attending: Cardiology | Admitting: Cardiology

## 2024-08-24 DIAGNOSIS — Z9889 Other specified postprocedural states: Secondary | ICD-10-CM

## 2024-08-24 NOTE — Progress Notes (Signed)
 Discharge Progress Report  Patient Details  Name: [name] MRN: [id number] Date of Birth: July 15, 1941 Referring Provider:   Flowsheet Row INTENSIVE CARDIAC REHAB ORIENT from 05/22/2024 in [facility]  Referring Provider Ladona Heinz, MD     Number of Visits: 66  Reason for Discharge:  Patient reached a stable level of exercise. Patient independent in their exercise. Patient has met program and personal goals.  Smoking History:  Social History   Tobacco Use  Smoking Status Never  Smokeless Tobacco Never    Diagnosis:  S/P MVR (mitral valve repair)  S/P TVR (tricuspid valve repair)  ADL UCSD:   Initial Exercise Prescription:  Initial Exercise Prescription - 05/22/24 1500       Date of Initial Exercise RX and Referring Provider   Date 05/22/24    Referring Provider Ladona Heinz, MD    Expected Discharge Date 08/17/24      NuStep   Level 1    SPM 85    Minutes 15    METs 1.5      Track   Laps 9    Minutes 15    METs 2.15      Prescription Details   Frequency (times per week) 3    Duration Progress to 30 minutes of continuous aerobic without signs/symptoms of physical distress      Intensity   THRR 40-80% of Max Heartrate 56-111    Ratings of Perceived Exertion 11-13    Perceived Dyspnea 0-4      Progression   Progression Continue to progress workloads to maintain intensity without signs/symptoms of physical distress.      Resistance Training   Training Prescription Yes    Weight 2 lbs    Reps 10-15          Discharge Exercise Prescription (Final Exercise Prescription Changes):  Exercise Prescription Changes - 08/24/24 1655       Response to Exercise   Blood Pressure (Admit) 116/62    Blood Pressure (Exit) 106/62    Heart Rate (Admit) 67 bpm    Heart Rate (Exercise) 114 bpm    Heart Rate (Exit) 78 bpm    Rating of Perceived Exertion (Exercise) 11    Perceived Dyspnea (Exercise) 0     Symptoms none    Comments Pt graduated teh [employer] program.    Duration Continue with 30 min of aerobic exercise without signs/symptoms of physical distress.    Intensity THRR unchanged      Progression   Progression Continue to progress workloads to maintain intensity without signs/symptoms of physical distress.    Average METs 3.9      Resistance Training   Training Prescription Yes    Weight 2 lbs    Reps 10-15    Time 10 Minutes      NuStep   Level 3    SPM 110    Minutes 15    METs 4.5      Track   Laps 18    Minutes 15    METs 3.3      Home Exercise Plan   Plans to continue exercise at Home (comment)    Frequency Add 4 additional days to program exercise sessions.    Initial Home Exercises Provided 06/11/24          Functional Capacity:  6 Minute Walk     Row Name 05/22/24 1454 08/15/24 1630  6 Minute Walk   Phase Initial Discharge    Distance 747 feet 1530 feet    Distance % Change -- 104.82 %    Distance Feet Change -- 783 ft    Walk Time 6 minutes 6 minutes    # of Rest Breaks 0 0    MPH 1.41 2.9    METS 1.25 2.97    RPE 11 10    Perceived Dyspnea  0 0    VO2 Peak 4.37 10.39    Symptoms No No    Resting HR 75 bpm 78 bpm    Resting BP 90/50 104/56    Resting Oxygen Saturation  96 % --    Exercise Oxygen Saturation  during 6 min walk 96 % --    Max Ex. HR 88 bpm 105 bpm    Max Ex. BP 106/62 124/68    2 Minute Post BP 100/50 --       Psychological, QOL, Others - Outcomes: PHQ 2/9:    08/24/2024    4:19 PM 05/22/2024    2:22 PM 12/20/2023    2:21 PM  Depression screen PHQ 2/9  Decreased Interest 0 0 0  Down, Depressed, Hopeless 0 0 0  PHQ - 2 Score 0 0 0  Altered sleeping 0 3   Tired, decreased energy 0 1   Change in appetite 0 0   Feeling bad or failure about yourself  0 0   Trouble concentrating 0 0   Moving slowly or fidgety/restless 0 0   Suicidal thoughts 0 0   PHQ-9 Score 0 4   Difficult doing work/chores Not  difficult at all Not difficult at all     Quality of Life:  Quality of Life - 08/22/24 1658       Quality of Life   Select Quality of Life      Quality of Life Scores   Health/Function Post 28.07 %    Socioeconomic Pre 30 %    Socioeconomic Post 30 %    Socioeconomic % Change  0 %    Psych/Spiritual Post 30 %    Family Post 24.4 %    GLOBAL Post 28.33 %          Personal Goals: Goals established at orientation with interventions provided to work toward goal.  Personal Goals and Risk Factors at Admission - 05/22/24 1432       Core Components/Risk Factors/Patient Goals on Admission   Heart Failure Yes    Intervention Provide a combined exercise and nutrition program that is supplemented with education, support and counseling about heart failure. Directed toward relieving symptoms such as shortness of breath, decreased exercise tolerance, and extremity edema.    Expected Outcomes Improve functional capacity of life;Short term: Attendance in program 2-3 days a week with increased exercise capacity. Reported lower sodium intake. Reported increased fruit and vegetable intake. Reports medication compliance.;Short term: Daily weights obtained and reported for increase. Utilizing diuretic protocols set by physician.;Long term: Adoption of self-care skills and reduction of barriers for early signs and symptoms recognition and intervention leading to self-care maintenance.    Lipids Yes    Intervention Provide education and support for participant on nutrition & aerobic/resistive exercise along with prescribed medications to achieve LDL 70mg , HDL >40mg .    Expected Outcomes Short Term: Participant states understanding of desired cholesterol values and is compliant with medications prescribed. Participant is following exercise prescription and nutrition guidelines.;Long Term: Cholesterol controlled with medications as prescribed, with individualized  exercise RX and with personalized nutrition  plan. Value goals: LDL < 70mg , HDL > 40 mg.           Personal Goals Discharge:  Goals and Risk Factor Review     Row Name 05/29/24 0805 06/08/24 1319 07/10/24 1558 08/08/24 0943 08/28/24 1342     Core Components/Risk Factors/Patient Goals Review   Personal Goals Review Weight Management/Obesity;Heart Failure;Lipids Weight Management/Obesity;Heart Failure;Lipids Weight Management/Obesity;Heart Failure;Lipids Weight Management/Obesity;Heart Failure;Lipids Weight Management/Obesity;Heart Failure;Lipids   Review Sherhonda started cardiac rehab on 05/28/24. Shakeema did fair with exercise. Initial; systolic BP was in the 90's. Patient reported feeling lightheaded. Symptoms resolved after getting water. BP's improved with activity Bindu started cardiac rehab on 05/28/24. Serena is off to a good start to exercise. Recent vital signs have been stable. Lorena is doing well with exercise at cardiac rehab.  Vital signs have been stable. Miles has lost 2.3 kg since starting the program. Corina continues to  do very well with exercise at cardiac rehab.  Vital signs have been stable. Yariela has lost 2.8 kg since starting the program. Madailein is now walking on the track and has returned to work. Latese did very well with exercise at cardiac rehab.  Vital signs were stable. Jamieka has lost 4.1  kg since starting the program. Odaliz completed cardiac rehab on 08/24/24   Expected Outcomes Shealee will continue to participate in cardiac rehab for exercise, nutrition and lifestyle modifications Denika will continue to participate in cardiac rehab for exercise, nutrition and lifestyle modifications Elonna will continue to participate in cardiac rehab for exercise, nutrition and lifestyle modifications Carlyn will continue to participate in cardiac rehab for exercise, nutrition and lifestyle modifications Elektra will continue to exercise, follow nutrition and lifestyle modifications upon completion of cardiac rehab      Exercise Goals and Review:  Exercise  Goals     Row Name 05/22/24 1333             Exercise Goals   Increase Physical Activity Yes       Intervention Provide advice, education, support and counseling about physical activity/exercise needs.;Develop an individualized exercise prescription for aerobic and resistive training based on initial evaluation findings, risk stratification, comorbidities and participant's personal goals.       Expected Outcomes Short Term: Attend rehab on a regular basis to increase amount of physical activity.;Long Term: Exercising regularly at least 3-5 days a week.;Long Term: Add in home exercise to make exercise part of routine and to increase amount of physical activity.       Increase Strength and Stamina Yes       Intervention Provide advice, education, support and counseling about physical activity/exercise needs.;Develop an individualized exercise prescription for aerobic and resistive training based on initial evaluation findings, risk stratification, comorbidities and participant's personal goals.       Expected Outcomes Short Term: Increase workloads from initial exercise prescription for resistance, speed, and METs.;Short Term: Perform resistance training exercises routinely during rehab and add in resistance training at home;Long Term: Improve cardiorespiratory fitness, muscular endurance and strength as measured by increased METs and functional capacity ( )       Able to understand and use rate of perceived exertion (RPE) scale Yes       Intervention Provide education and explanation on how to use RPE scale       Expected Outcomes Short Term: Able to use RPE daily in rehab to express subjective intensity level;Long Term:  Able to use RPE to guide intensity  level when exercising independently       Knowledge and understanding of Target Heart Rate Range (THRR) Yes       Intervention Provide education and explanation of THRR including how the numbers were predicted and where they are located for  reference       Expected Outcomes Short Term: Able to state/look up THRR;Long Term: Able to use THRR to govern intensity when exercising independently;Short Term: Able to use daily as guideline for intensity in rehab       Able to check pulse independently Yes       Intervention Provide education and demonstration on how to check pulse in carotid and radial arteries.;Review the importance of being able to check your own pulse for safety during independent exercise       Expected Outcomes Short Term: Able to explain why pulse checking is important during independent exercise;Long Term: Able to check pulse independently and accurately       Understanding of Exercise Prescription Yes       Intervention Provide education, explanation, and written materials on patient's individual exercise prescription       Expected Outcomes Short Term: Able to explain program exercise prescription;Long Term: Able to explain home exercise prescription to exercise independently          Exercise Goals Re-Evaluation:  Exercise Goals Re-Evaluation     Row Name 05/28/24 1655 06/11/24 1535 07/05/24 0848 07/23/24 1621 07/25/24 1618     Exercise Goal Re-Evaluation   Exercise Goals Review Increase Physical Activity;Understanding of Exercise Prescription;Increase Strength and Stamina;Knowledge and understanding of Target Heart Rate Range (THRR);Able to understand and use rate of perceived exertion (RPE) scale Increase Physical Activity;Understanding of Exercise Prescription;Increase Strength and Stamina;Knowledge and understanding of Target Heart Rate Range (THRR);Able to understand and use rate of perceived exertion (RPE) scale Increase Physical Activity;Understanding of Exercise Prescription;Increase Strength and Stamina;Knowledge and understanding of Target Heart Rate Range (THRR);Able to understand and use rate of perceived exertion (RPE) scale Increase Physical Activity;Understanding of Exercise Prescription;Increase  Strength and Stamina;Knowledge and understanding of Target Heart Rate Range (THRR);Able to understand and use rate of perceived exertion (RPE) scale --   Comments Pt first day in the [facility] program. Pt tolerated exercise well with an average MET level of 1.4. Pt is off to a good start and is learning her THRR, RPE and ExRx. Pt mild lightheaded pre exercise, seated wts and stretches for comfort Reviewed exercise prescription with Camie. She is walking daily and increasing distance as tolerated. We discussed gradually increasing walking to 30 minutes: 15 minutes twice daily or 10 minutes three times/day. Her goal is to increase leg strength. REVD MET's and goals. Pt tolerated exercise well with an average MET level of 2.7. Pt is doing well and making gradual increases, just started doing 15 mins at level 3 and has done well with the change, but doesnt feel able to keep up the total time REVD MET's and goals. Pt tolerated exercise well with an average MET level of 2.8. Pt has now started adding in 10 mins of walking with a rollator and is feeling good. Will switch to 15 mins as she gets comfortable. She said she walked the dog for the first time and felt good. She said ADL's are getting easier as well. Overall pt is doing well and is increasing her confidence and strength --   Expected Outcomes will continue to monitor pt and progress workloads as tolerated without sign or symptom Continue walking at  home in addition to exercise at cardiac rehab to increase strength and improve balance. Will continue to monitor and increase WL's as tolerated Will continue to monitor and increase WL's as tolerated --    Row Name 08/24/24 1658             Exercise Goal Re-Evaluation   Exercise Goals Review Increase Physical Activity;Understanding of Exercise Prescription;Increase Strength and Stamina;Knowledge and understanding of Target Heart Rate Range (THRR);Able to understand and use rate of perceived exertion (RPE)  scale       Comments Pt graduated teh [employer] program. Pt tolerated exercise well with an average MET level of 3.9. Pt graduated teh [employer] and did very well. She over doubled her distance and gained 783 ft for a total of 1538ft. She will continue to exercise by walking and goign to [facility] 4-5 days a week for 30-60 mins per session. She is also very excited to go back to work full time. We are very proud of her progress       Expected Outcomes Pt will continue to exercise on her own and gain strength          Nutrition & Weight - Outcomes:  Pre Biometrics - 05/22/24 1319       Pre Biometrics   Waist Circumference 31.13 inches    Hip Circumference 36.75 inches    Waist to Hip Ratio 0.85 %    Triceps Skinfold 8 mm    % Body Fat 29.6 %    Grip Strength 18 kg    Flexibility 13 in    Single Leg Stand --   Unable to perform         Post Biometrics - 08/16/24 0741        Post  Biometrics   Height 5' 4 (1.626 m)    Weight 56.1 kg    Waist Circumference 28 inches    Hip Circumference 35.25 inches    Waist to Hip Ratio 0.79 %    BMI (Calculated) 21.22    Triceps Skinfold 8 mm    % Body Fat 27.7 %    Grip Strength 17 kg    Flexibility 14 in    Single Leg Stand 3 seconds          Nutrition:  Nutrition Therapy & Goals - 05/28/24 1554       Nutrition Therapy   Diet Heart healthy diet      Personal Nutrition Goals   Nutrition Goal Patient to identify strategies for reducing cardiovascular risk by attending the [facility] education and nutrition series weekly.    Personal Goal #2 Patient to improve diet quality by using the plate method as a guide for meal planning to include lean protein/plant protein, fruits, vegetables, whole grains, nonfat dairy as part of a well-balanced diet.    Comments Pheobe has medical history of hypertension, hyperlipidemia, CKD stage III, diverticulosis, endometrial cancer in 2009 treated with surgery and radiation therapy, mitral  valve prolapse and MvR, chronic dyspnea on exertion, iron  deficiency anemia. She is down ~25# since prior to surgery on 01/13/24 (158#). She reports improved appetite and is motivated to maintain weight at this time. Reviewed protein intake and protein sources. She continues to enjoy a wide variety of foods and follows a low sodium diet. Patient will benefit from participation in intensive cardiac rehab for nutrition education, exercise, and lifestyle modification.      Intervention Plan   Intervention Prescribe, educate and counsel regarding individualized  specific dietary modifications aiming towards targeted core components such as weight, hypertension, lipid management, diabetes, heart failure and other comorbidities.;Nutrition handout(s) given to patient.    Expected Outcomes Short Term Goal: Understand basic principles of dietary content, such as calories, fat, sodium, cholesterol and nutrients.;Long Term Goal: Adherence to prescribed nutrition plan.          Nutrition Discharge:  Nutrition Assessments - 08/22/24 1654       Rate Your Plate Scores   Post Score 77          Education Questionnaire Score:  Knowledge Questionnaire Score - 08/22/24 1657       Knowledge Questionnaire Score   Post Score 22/24          Goals reviewed with patient; copy given to patient.Pt graduates from  Intensive/Traditional cardiac rehab program on 08/24/24  with completion of 66  exercise and education sessions. Pt maintained good attendance and progressed nicely during their participation in rehab as evidenced by increased MET level. Hanni increased her distance on her post exercise walk test by 783 feet and lost 4.1 kg while enrolled in the program. Keila is now walking without an assistive device.  Medication list reconciled. Repeat  PHQ score- 0 .  Pt has made significant lifestyle changes and should be commended for their success. Kanitra  achieved their goals during cardiac rehab.   Pt plans to  continue exercise walking and attending the [employer] 4-5 days a week. Jezebelle has returned to work full time. Milessa did remarkably well and made remarkable progress! We are proud of Eyanna's accomplishments and weight loss.[name] Elpidio Quan RN BSN

## 2024-08-31 ENCOUNTER — Telehealth: Payer: Self-pay | Admitting: Pharmacy Technician

## 2024-08-31 NOTE — Telephone Encounter (Signed)
 Auth Submission: APPROVED Site of care: Site of care: [facility] Payer: [employer] MEDICARE Medication & CPT/J Code(s) submitted: Feraheme  (ferumoxytol ) [id number] Diagnosis Code: D50.9 Route of submission (phone, fax, portal):  Phone # Fax # Auth type: Buy/Bill PB Units/visits requested: 510MG  X2 DOSES (MEDICAL NECESSITY) patient may receive more than x2 doses based on medical necessity Reference number: [id number] Approval from: 11/01/24 to 10/31/25

## 2024-09-03 ENCOUNTER — Ambulatory Visit (HOSPITAL_COMMUNITY): Admitting: Internal Medicine

## 2024-09-10 DIAGNOSIS — S62325D Displaced fracture of shaft of fourth metacarpal bone, left hand, subsequent encounter for fracture with routine healing: Secondary | ICD-10-CM | POA: Diagnosis not present

## 2024-09-10 DIAGNOSIS — M19041 Primary osteoarthritis, right hand: Secondary | ICD-10-CM | POA: Diagnosis not present

## 2024-10-08 NOTE — Progress Notes (Signed)
 ADVANCED HF CLINIC NOTE  Primary Care: Clarice Nottingham, MD Primary Cardiologist: Gordy Bergamo, MD HF Cardiologist: Dr. Cherrie  HPI: [name] is an 83 y.o. female with HTN and PAF, MR and systolic heart failure with recovered EF.    Admitted 2/24 for further MR workup. She underwent L/RHC which showed [facility] 2/25 with no CAD. RA 8, PA 41/17 (14), Fick CO/CI 7.76/4.41, PAPi 3, LVEDP 11. Following her heart cath she went into a fib RVR, asymptomatic but new. TEE confirmed severe MR from posterior leaflet prolapse/flail and mod TR with EF 60%. Was initially set up for DCCV but chemically converted and was able to transition to PO amiodarone . Discharged with plans for MVR the following month with Dr. Maryjane.    Admitted 02/08/24 for MVR, MAZE and LAA clipping. Intra-Op TEE with EF 55-60% and severe MR. Post-op course complicated by persistent a fib RVR and vasoplegic shock requiring vasopressors. Able to extubate the following day. Pressors weaned off.  AHF team consulted after echo showed new drop in EF to 35-40% with persistent a fib and volume overload. Diuresed, able to wean drips and GDMT titrated but limited by soft BP. Underwent L thoracentesis 02/27/24.  Discharged to SNF, weight 145 lbs.  Echo 03/21/24: EF 55-60%, LV no RWMA, RV normal, trivial MR, mod TR.   Echo 8/25 EF 55-60% RV normal severe BAE mild MR mod TR   Saw Dr. Bergamo in 10/25. Torsemide  decreased to 20 daily due to low BP and amio decreased to 100 daily.   Today she returns for HF follow up.  Cardiac Studies - Echo 8/25: EF 55-60% RV normal severe BAE mild MR mod TR   - Echo 03/21/24: EF 55-60%, LV no RWMA, RV normal, trivial MR, mod TR.   - Echo (02/14/24): EF 35-40%, LV with GHK, RV normal, LA mod dilated, RA severely dilated, trivial MR, mod TR  - Intra-Op TEE 4/25: EF 55-60%, severe MR  - TEE 2/25: LVEF 60-65%, no LAA thrombus, severe MR from posterior leaflet prolapse/flail and moderate TR  - R/LHC 2/25: no CAD,  RA 8, PA 41/17 (30), PCWP 14, CO/CI (Fick) 7.76/4.41  Past Medical History:  Diagnosis Date   Acid reflux    Anemia    Arthritis    Atrial fibrillation (HCC)    Cataract    Diverticulosis of colon    Endometrial cancer (HCC)    1A grade endometrial ca   Essential hypertension 01/24/2009   Qualifier: Diagnosis of  By: Nelson-Smith CMA (AAMA), Dottie     Hx of radiation therapy 04/24/08-05/29/08& 8/12/,8/19,&06/27/08   external beam  through 7/09 ,then intracavity in 06/2008   Hypertension    Mitral valve prolapse    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   Tricuspid regurgitation    Current Outpatient Medications  Medication Sig Dispense Refill   acetaminophen  (TYLENOL ) 500 MG tablet Take 1-2 tablets (500-1,000 mg total) by mouth every 6 (six) hours as needed for mild pain (pain score 1-3) or fever.     amiodarone  (PACERONE ) 200 MG tablet Take 0.5 tablets (100 mg total) by mouth daily.     amoxicillin (AMOXIL) 500 MG capsule Take 500 mg by mouth as needed. Patient takes 4 capsule before dental procedures     apixaban  (ELIQUIS ) 2.5 MG TABS tablet Take 1 tablet (2.5 mg total) by mouth 2 (two) times daily. 60 tablet 11   atorvastatin  (LIPITOR) 10 MG tablet Take 1 tablet (10 mg total) by mouth daily. 90 tablet 3  b complex vitamins tablet Take 1 tablet by mouth daily.       CALCIUM  PO Take 650 mg by mouth daily.     Cholecalciferol (VITAMIN D3) 125 MCG (5000 UT) CAPS Take 5,000 Units by mouth daily.     denosumab  (PROLIA ) 60 MG/ML SOSY injection Inject 60 mg into the skin every 6 (six) months.     gabapentin (NEURONTIN) 100 MG capsule Take 100 mg by mouth 2 (two) times daily.     magnesium  gluconate (MAGONATE) 500 (27 Mg) MG TABS tablet Take 1 tablet (500 mg total) by mouth daily.     Misc Natural Products (GLUCOSAMINE CHOND MSM FORMULA) TABS Take 1 tablet by mouth as needed.     Multiple Vitamins-Minerals (MULTIVITAMINS THER. W/MINERALS) TABS Take 1 tablet by mouth daily.        ondansetron  (ZOFRAN -ODT) 4 MG disintegrating tablet Take 4 mg by mouth every 8 (eight) hours as needed.     pantoprazole  (PROTONIX ) 40 MG tablet Take 40 mg by mouth daily.     potassium chloride  SA (KLOR-CON  M) 20 MEQ tablet Take 1 tablet (20 mEq total) by mouth 2 (two) times daily. 60 tablet 6   torsemide  (DEMADEX ) 20 MG tablet Take 1 tablet (20 mg total) by mouth as directed. 1 tab daily in the morning. Can take 2 extra tab daily as needed for leg swelling     Zinc 50 MG TABS Take 50 mg by mouth daily.     No current facility-administered medications for this encounter.   Allergies  Allergen Reactions   Crestor [Rosuvastatin] Other (See Comments)    Leg cramps   Glycopyrrolate      Other reaction(s): extreme dry mouth   Codeine Other (See Comments)    Hyper   Social History   Socioeconomic History   Marital status: Married    Spouse name: Not on file   Number of children: 1   Years of education: Not on file   Highest education level: Not on file  Occupational History   Occupation: Environmental Health Practitioner  Tobacco Use   Smoking status: Never   Smokeless tobacco: Never  Vaping Use   Vaping status: Never Used  Substance and Sexual Activity   Alcohol use: Yes    Comment: rarely   Drug use: No   Sexual activity: Never  Other Topics Concern   Not on file  Social History Narrative   Not on file   Social Drivers of Health   Financial Resource Strain: Not on file  Food Insecurity: No Food Insecurity (02/08/2024)   Hunger Vital Sign    Worried About Running Out of Food in the Last Year: Never true    Ran Out of Food in the Last Year: Never true  Transportation Needs: No Transportation Needs (02/08/2024)   PRAPARE - Administrator, Civil Service (Medical): No    Lack of Transportation (Non-Medical): No  Physical Activity: Not on file  Stress: Not on file  Social Connections: Socially Integrated (02/09/2024)   Social Connection and Isolation Panel    Frequency of  Communication with Friends and Family: More than three times a week    Frequency of Social Gatherings with Friends and Family: More than three times a week    Attends Religious Services: More than 4 times per year    Active Member of [employer] or Organizations: Yes    Attends Engineer, Structural: More than 4 times per year    Marital Status: Married  Intimate Partner Violence: Not At Risk (02/08/2024)   Humiliation, Afraid, Rape, and Kick questionnaire    Fear of Current or Ex-Partner: No    Emotionally Abused: No    Physically Abused: No    Sexually Abused: No   Family History  Problem Relation Age of Onset   Diabetes type II Mother    Breast cancer Mother    Leukemia Father    Diabetes type II Sister    Breast cancer Sister    Cancer Brother 71       throat 10/2020   Pancreatic cancer Maternal Aunt        x2   Diabetes Maternal Grandmother    Diabetes Maternal Grandfather    Colon cancer Neg Hx    Stomach cancer Neg Hx    Esophageal cancer Neg Hx    Wt Readings from Last 3 Encounters:  08/20/24 53.1 kg (117 lb 1.6 oz)  08/16/24 56.1 kg (123 lb 10.9 oz)  07/04/24 58.4 kg (128 lb 12.8 oz)   There were no vitals taken for this visit.  PHYSICAL EXAM: General:  NAD. No resp difficulty, walked into clinic, appears younger than stated age HEENT: Normal Neck: Supple. No JVD. Cor: Regular rate & rhythm. No rubs, gallops or murmurs. Lungs: Clear Abdomen: Soft, nontender, nondistended.  Extremities: No cyanosis, clubbing, rash, trace BLE edema, compression hose on  Neuro: Alert & oriented x 3, moves all 4 extremities w/o difficulty. Affect pleasant.  ASSESSMENT & PLAN: HF with improved EF - R/LHC 2/25 with no CAD. RA 8, PA 41/17 (14), Fick CO/CI 7.76/4.41, PAPi 3, LVEDP 11 - Intra-op TEE (at time of MVR) 02/08/24 with EF 55-60%  - Echo 02/14/24: EF 35-40%, LV with GHK, RV normal, LA mod dilated, RA severely dilated, trivial MR, mod TR - Echo 03/21/24: EF 55-60%, LV no RWMA,  RV normal, trivial MR, mod TR.  - Suspect previous drop in EF was 2/2 persistent AF RVR.  - Echo 8/25 EF 55-60% RV normal severe BAE mild MR mod TR  - Doing great! NYHA I-II, volume much better. - Continue compression hose - Continue torsemide  20 mg daily (recently decreased by Dr. Ladona). - off spiro, no BP room to add back today. - Off SGLT2i with UTIs - Labs today   Atrial fibrillation  - S/p Maze and LAA clipping 4/25 - Failed DCCV X 2 02/16/24 - Followed by AF clinic - Regular on exam today - Continue amiodarone  100 mg daily. Followed by Dr. Ganji - Continue Eliquis  2.5 mg bid (reduced dose given age and SCr). Denies abnormal bleeding  Severe MR - s/p MVR - Trivial MR on echo 02/14/24 - Echo 5/21 with trivial MR - no murmur heard on exam today   4. Venous Insuffiencey  - symptomatic, legs ache. Bilateral  + varicose veins on exam  - Continue daytime compression stockings + elevation when able - has seen [facility] in 10/25 - mild deep and superficial venous insufficiency -> recommended compression/elevation  Toribio Fuel, MD  9:57 PM

## 2024-10-09 ENCOUNTER — Inpatient Hospital Stay (HOSPITAL_COMMUNITY)
Admission: RE | Admit: 2024-10-09 | Discharge: 2024-10-09 | Disposition: A | Source: Ambulatory Visit | Attending: Internal Medicine | Admitting: Internal Medicine

## 2024-10-09 ENCOUNTER — Encounter (HOSPITAL_COMMUNITY): Payer: Self-pay | Admitting: Internal Medicine

## 2024-10-09 VITALS — BP 100/58 | HR 71 | Wt 121.4 lb

## 2024-10-09 DIAGNOSIS — I872 Venous insufficiency (chronic) (peripheral): Secondary | ICD-10-CM

## 2024-10-09 DIAGNOSIS — Z9889 Other specified postprocedural states: Secondary | ICD-10-CM

## 2024-10-09 DIAGNOSIS — I480 Paroxysmal atrial fibrillation: Secondary | ICD-10-CM | POA: Diagnosis not present

## 2024-10-09 NOTE — Addendum Note (Signed)
 Encounter addended by: Marcelina Lisa HERO, RN on: 10/09/2024 10:49 AM  Actions taken: Specialty comments modified, Clinical Note Signed

## 2024-10-09 NOTE — Patient Instructions (Signed)
 CONGRATULATIONS YOU HAVE GRADUATED FROM THE [facility].  At the [facility], you and your health needs are our priority. As part of our continuing mission to provide you with exceptional heart care, we have created designated Provider Care Teams. These Care Teams include your primary Cardiologist (physician) and Advanced Practice Providers (APPs- Physician Assistants and Nurse Practitioners) who all work together to provide you with the care you need, when you need it.   You may see any of the following providers on your designated Care Team at your next follow up: Dr Toribio Fuel Dr Ezra Shuck Dr. Morene Brownie Greig Mosses, NP Caffie Shed, [place] [facility] [place], [place] Beckey Coe, NP Jordan Lee, NP Ellouise Class, NP Tinnie Redman, PharmD Jaun Bash, PharmD   Please be sure to bring in all your medications bottles to every appointment.    Thank you for choosing [facility]

## 2024-10-17 ENCOUNTER — Ambulatory Visit

## 2024-10-17 ENCOUNTER — Encounter: Payer: Self-pay | Admitting: Family Medicine

## 2024-10-17 ENCOUNTER — Ambulatory Visit: Admitting: Family Medicine

## 2024-10-17 VITALS — BP 116/64 | HR 85 | Ht 64.0 in | Wt 122.0 lb

## 2024-10-17 DIAGNOSIS — G8929 Other chronic pain: Secondary | ICD-10-CM

## 2024-10-17 DIAGNOSIS — M5442 Lumbago with sciatica, left side: Secondary | ICD-10-CM

## 2024-10-17 DIAGNOSIS — R29898 Other symptoms and signs involving the musculoskeletal system: Secondary | ICD-10-CM

## 2024-10-17 NOTE — Progress Notes (Signed)
° °  LILLETTE Ileana Collet, PhD, LAT, ATC acting as a scribe for Artist Lloyd, MD.  [name] is a 83 y.o. female who presents to [facility] today for low back and L leg pain x 1-2 month. Pt suffered a fall when 1st returning home after her heart surgery in April. Pt locates pain to L-side of her back is the worse, but bilat, w/ radiating pain along the lateral aspect her whole L leg. Pain seems to be worse at night, waking her up when trying to roll over.   Radiating pain: yes LE numbness/tingling: no LE weakness: yes Aggravates: laying supine Treatments tried: heating pad, Tylenol   Pertinent review of systems: No fevers or chills  Relevant historical information: History of a mitral valve repair and maze procedure in April of this year.  She did complete cardiopulmonary rehab in October.   Exam:  BP 116/64   Pulse 85   Ht 5' 4 (1.626 m)   Wt 122 lb (55.3 kg)   SpO2 96%   BMI 20.94 kg/m  General: Well Developed, well nourished, and in no acute distress.   MSK: L-spine: Normal-appearing Nontender palpation lumbar midline.  Tender palpation paraspinal musculature. Decreased lumbar motion. Lower extremity strength is reduced.  Left foot dorsiflexion and plantarflexion reduced 4/5. Reflexes are intact.   Lab and Radiology Results  X-ray images lumbar spine obtained today personally and independently interpreted. Anterior listhesis L5-S1.  No acute fractures are visible. Await formal radiology review    Assessment and Plan: 83 y.o. female with left lumbar radiculopathy with some resulting left weakness and chronic low back pain.  Plan for x-ray and MRI lumbar spine.  Will go ahead and get physical therapy started.  I do not think physical therapy is likely to be completely sufficient for symptoms but I think it certainly could help.  Anticipate epidural steroid injection following MRI report coming back.  She will need to temporarily pause her  Eliquis .  She does have a mitral valve repair which does look to be MRI conditional.   PDMP not reviewed this encounter. Orders Placed This Encounter  Procedures   DG Lumbar Spine 2-3 Views    Standing Status:   Future    Expiration Date:   11/17/2024    Reason for Exam (SYMPTOM  OR DIAGNOSIS REQUIRED):   low back pain    Preferred imaging location?:   [facility] [place] [facility]   MR Lumbar Spine Wo Contrast    Standing Status:   Future    Expiration Date:   10/17/2025    What is the patient's sedation requirement?:   No Sedation    Does the patient have a pacemaker or implanted devices?:   Yes    Manufacturer of pacemake or implanted device?:   Mitral valve repair    Preferred imaging location?:   [facility] (table limit-550lbs)   Ambulatory referral to Physical Therapy    Referral Priority:   Routine    Referral Type:   Physical Medicine    Referral Reason:   Specialty Services Required    Requested Specialty:   Physical Therapy    Number of Visits Requested:   1   No orders of the defined types were placed in this encounter.    Discussed warning signs or symptoms. Please see discharge instructions. Patient expresses understanding.   The above documentation has been reviewed and is accurate and complete Artist Lloyd, M.D.

## 2024-10-17 NOTE — Patient Instructions (Addendum)
 Thank you for coming in today.   You should hear from MRI scheduling within 1 week. If you do not hear please let me know.    I've referred you to Physical Therapy.  Let us  know if you don't hear from them in one week.  Let's see what the MRI shows and plan from there

## 2024-10-22 ENCOUNTER — Inpatient Hospital Stay: Admission: RE | Admit: 2024-10-22 | Discharge: 2024-10-22 | Attending: Family Medicine | Admitting: Family Medicine

## 2024-10-22 ENCOUNTER — Encounter: Payer: Self-pay | Admitting: *Deleted

## 2024-10-22 DIAGNOSIS — G8929 Other chronic pain: Secondary | ICD-10-CM

## 2024-10-22 DIAGNOSIS — R29898 Other symptoms and signs involving the musculoskeletal system: Secondary | ICD-10-CM

## 2024-11-01 ENCOUNTER — Ambulatory Visit: Payer: Self-pay | Admitting: Family Medicine

## 2024-11-01 DIAGNOSIS — G8929 Other chronic pain: Secondary | ICD-10-CM

## 2024-11-01 DIAGNOSIS — R29898 Other symptoms and signs involving the musculoskeletal system: Secondary | ICD-10-CM

## 2024-11-01 NOTE — Progress Notes (Signed)
 Low back x-ray shows medium arthritis of the SI joints and arthritis of the joints and the base of the spine.  There are possible pinched nerves based on the way the x-ray looks.  Fortunately no obvious broken bone is visible.

## 2024-11-09 ENCOUNTER — Ambulatory Visit
Admission: RE | Admit: 2024-11-09 | Discharge: 2024-11-09 | Disposition: A | Source: Ambulatory Visit | Attending: Family Medicine | Admitting: Family Medicine

## 2024-11-09 DIAGNOSIS — G8929 Other chronic pain: Secondary | ICD-10-CM

## 2024-11-09 DIAGNOSIS — R29898 Other symptoms and signs involving the musculoskeletal system: Secondary | ICD-10-CM

## 2024-11-13 ENCOUNTER — Ambulatory Visit (HOSPITAL_COMMUNITY): Admitting: Internal Medicine

## 2024-11-17 ENCOUNTER — Ambulatory Visit (HOSPITAL_BASED_OUTPATIENT_CLINIC_OR_DEPARTMENT_OTHER): Attending: Family Medicine | Admitting: Rehabilitative and Restorative Service Providers"

## 2024-11-17 ENCOUNTER — Encounter (HOSPITAL_BASED_OUTPATIENT_CLINIC_OR_DEPARTMENT_OTHER): Payer: Self-pay | Admitting: Rehabilitative and Restorative Service Providers"

## 2024-11-17 ENCOUNTER — Other Ambulatory Visit: Payer: Self-pay

## 2024-11-17 DIAGNOSIS — M5459 Other low back pain: Secondary | ICD-10-CM

## 2024-11-17 DIAGNOSIS — M6281 Muscle weakness (generalized): Secondary | ICD-10-CM

## 2024-11-17 DIAGNOSIS — M5442 Lumbago with sciatica, left side: Secondary | ICD-10-CM | POA: Insufficient documentation

## 2024-11-17 DIAGNOSIS — G8929 Other chronic pain: Secondary | ICD-10-CM | POA: Diagnosis not present

## 2024-11-17 DIAGNOSIS — M79605 Pain in left leg: Secondary | ICD-10-CM

## 2024-11-17 DIAGNOSIS — R29898 Other symptoms and signs involving the musculoskeletal system: Secondary | ICD-10-CM | POA: Diagnosis not present

## 2024-11-17 NOTE — Therapy (Signed)
 " OUTPATIENT PHYSICAL THERAPY EVALUATION   Patient Name: [name] MRN: [id number] DOB:[birth year], 84 y.o., female Today's Date: 11/17/2024  END OF SESSION:  PT End of Session - 11/17/24 0913     Visit Number 1    Number of Visits 16    Date for Recertification  01/12/25    Authorization Type [employer] MCR    Progress Note Due on Visit 10    PT Start Time 0748    PT Stop Time 0828    PT Time Calculation (min) 40 min    Activity Tolerance Patient tolerated treatment well;No increased pain    Behavior During Therapy [facility] for tasks assessed/performed          Past Medical History:  Diagnosis Date   Acid reflux    Anemia    Arthritis    Atrial fibrillation (HCC)    Cataract    Diverticulosis of colon    Endometrial cancer (HCC)    1A grade endometrial ca   Essential hypertension 01/24/2009   Qualifier: Diagnosis of  By: Nelson-Smith CMA (AAMA), Dottie     Hx of radiation therapy 04/24/08-05/29/08& 8/12/,8/19,&06/27/08   external beam  through 7/09 ,then intracavity in 06/2008   Hypertension    Mitral valve prolapse    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   Tricuspid regurgitation    Past Surgical History:  Procedure Laterality Date   APPENDECTOMY     1967   arthroscopic knee right surgery     CLIPPING OF ATRIAL APPENDAGE N/A 02/08/2024   Procedure: CLIPPING, LEFT ATRIAL APPENDAGE USING A MEDTRONIC CLIP;  Surgeon: Maryjane Mt, MD;  Location: [facility] OR;  Service: Open Heart Surgery;  Laterality: N/A;   ECTOPIC PREGNANCY SURGERY  1967   IR THORACENTESIS RIGHT ASP PLEURAL SPACE W/IMG GUIDE  02/27/2024   iron  infusion  12/2022   LAPAROSCOPIC HYSTERECTOMY     total   MAZE N/A 02/08/2024   Procedure: MAZE PROCEDURE;  Surgeon: Maryjane Mt, MD;  Location: [facility] OR;  Service: Open Heart Surgery;  Laterality: N/A;   MITRAL VALVE REPAIR N/A 02/08/2024   Procedure: MITRAL VALVE REPAIR USING A SIMULUS SEMI-RIGID ANNULOPLASTY BAND;  Surgeon: Maryjane Mt, MD;  Location:  [facility] OR;  Service: Open Heart Surgery;  Laterality: N/A;  possible replacement   ORTHOPEDIC SURGERY     Orthoscopic Knee Surgery   RIGHT/LEFT HEART CATH AND CORONARY ANGIOGRAPHY N/A 12/30/2023   Procedure: RIGHT/LEFT HEART CATH AND CORONARY ANGIOGRAPHY;  Surgeon: Ladona Heinz, MD;  Location: [facility] INVASIVE CV LAB;  Service: Cardiovascular;  Laterality: N/A;   TEAR DUCT PROBING     TEE WITHOUT CARDIOVERSION N/A 02/08/2024   Procedure: ECHOCARDIOGRAM, TRANSESOPHAGEAL;  Surgeon: Maryjane Mt, MD;  Location[facility] OR;  Service: Open Heart Surgery;  Laterality: N/A;   TOOTH EXTRACTION     bone graft   TRANSESOPHAGEAL ECHOCARDIOGRAM (CATH LAB) N/A 12/30/2023   Procedure: TRANSESOPHAGEAL ECHOCARDIOGRAM;  Surgeon: Loni Soyla DELENA, MD;  Location: [facility] INVASIVE CV LAB;  Service: Cardiovascular;  Laterality: N/A;   Patient Active Problem List   Diagnosis Date Noted   MV repair (30 mm simulus StJude ring), maze and left atrial appendage clipping (PENDITURE [id number]) on 02/08/2024 03/09/2024   Status post circumferential ablation of pulmonary vein 03/09/2024   Status post ligation of left atrial appendage 03/09/2024   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   Severe mitral regurgitation 02/08/2024   Mitral regurgitation 12/31/2023   Nonrheumatic mitral valve regurgitation 12/30/2023   Atrial fibrillation with RVR (HCC) 12/30/2023  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   Anemia 02/01/2019   Iron  deficiency anemia 11/08/2018   Cancer (HCC)    Enteritis 09/17/2011   CANCER, ENDOMETRIUM 01/24/2009   Essential hypertension 01/24/2009   DIVERTICULOSIS OF COLON 01/24/2009   RECTAL BLEEDING 01/24/2009   Diarrhea 01/24/2009   HEMORRHOIDS, HX OF 01/24/2009      REFERRING PROVIDER:   Joane Artist RAMAN, MD    REFERRING DIAG:  [phone] (ICD-10-CM) - Chronic bilateral low back pain with left-sided sciatica  R29.898 (ICD-10-CM) - Left leg weakness    Rationale for Evaluation and Treatment:  Rehabilitation  THERAPY DIAG:  Other low back pain  Muscle weakness (generalized)  Pain in left leg  ONSET DATE: 06/2024   SUBJECTIVE:                                                                                                                                                                                           SUBJECTIVE STATEMENT: Had open heart surgery 02/08/24 and then [facility]. Aug 2025 it started hurting mid L buttock and going down to the foot. Was a sharp pain. Laying flat aggravates and good posture improves.   PERTINENT HISTORY:    low back and L leg pain x 1-2 month. Pt suffered a fall when 1st returning home after her heart surgery in April. Pt locates pain to L-side of her back is the worse, but bilat, w/ radiating pain along the lateral aspect her whole L leg. Pain seems to be worse at night, waking her up when trying to roll over.   PAIN:  Are you having pain? Yes: NPRS scale: 6/10 Pain location: sacrum Pain description: achey Aggravating factors: laying down Relieving factors: good posture  PRECAUTIONS:  Endometrial Cancer, Mitral valve repair  RED FLAGS: None   WEIGHT BEARING RESTRICTIONS:  No  FALLS:  Has patient fallen in last 6 months? No  LIVING ENVIRONMENT: Lives with: lives with their family Lives in: House/apartment Stairs: yes Has following equipment at home: Single point cane and Environmental Consultant - 2 wheeled  OCCUPATION:  Works for a careers adviser company  PLOF:  Independent  PATIENT GOALS:  To move without pain   OBJECTIVE:  Note: Objective measures were completed at Evaluation unless otherwise noted.  DIAGNOSTIC FINDINGS:  10/17/24: Xray- 1. Moderate degenerative changes of the sacroiliac joints with possible chronic sacral insufficiency fracture. 2. Moderate facet arthropathy of the lower lumbar spine with associated grade 1 to 2 anterolisthesis of L4 on L5 and disc space narrowing at L4-5,  slightly progressed. 3. Severe disc space narrowing at L5-S1 with additional mild spondylosis of the lumbar spine and  visualized lower thoracic spine.  11/09/24 MRI exam but not notes  PATIENT SURVEYS:  To be completed at first tx visit  COGNITIVE STATUS: Within functional limits for tasks assessed   SENSATION: WFL  EDEMA:  Yes: bil lower legs; wearing compression stockings  POSTURE:  rounded shoulders and increased thoracic kyphosis    GAIT: Distance walked: in gym Assistive device utilized: None Level of assistance: Modified independence Comments: stands with left knee slightly bent   Body Part #1 Lumbar, Hip, and Knee  PALPATION: [place] symmetrical, popliteal fossa =; L gluteal fold significantly higher and glute tighter with tenderness to light tough palpation.Pt unable to tolerate true PROM bil Les due to reported glute pain and proximal hamstring discomfort.  LUMBAR ROM:   Active  A/PROM  eval  Flexion WNL  Extension WNL  Right lateral flexion WNL  Left lateral flexion WNL  Right rotation Limited 80%  Left rotation Limited 80%   (Blank rows = not tested) L lateral lean incrased L radicular sxs    LOWER EXTREMITY ROM:     Passive  Right eval Left eval  Hip flexion [facility]  Hip extension    Hip abduction    Hip adduction    Hip internal rotation [facility] WFL  Hip external rotation [facility]  Knee flexion WNL WNL  Knee extension WNL WNL  Ankle dorsiflexion    Ankle plantarflexion    Ankle inversion    Ankle eversion     (Blank rows = not tested)   LOWER EXTREMITY MMT:    MMT Right eval Left eval  Hip flexion 23 23.8  Hip extension    Hip abduction    Hip adduction    Hip internal rotation    Hip external rotation    Knee flexion 23.2 17.8  Knee extension 15.4 16.4   Ankle dorsiflexion 18.5 19.4  Ankle plantarflexion    Ankle inversion    Ankle eversion     (Blank rows = not tested)    SPECIAL TESTS:  Lumbar Straight leg raise test:  Negative, Slump test: Negative, and Single leg stance test: Positive. SLR + for Hamstring tightness   TREATMENT DATE:  11/17/24: Evaluation completed. Reviewed with pt she should consider using her walker or cane esp on unbalanced days. Reviewed PT findings. Issued HEP.      PATIENT EDUCATION:  Education details: HEP Person educated: Patient Education method: Programmer, Multimedia, Facilities Manager, and Handouts Education comprehension: verbalized understanding and returned demonstration  HOME EXERCISE PROGRAM: Access Code: [email or web address] URL: [email or web address] Date: 11/17/2024 Prepared by: Alger Ada  Exercises - Supine Quad Set  - 2 x daily - 7 x weekly - 1 sets - 10-20 reps - 2-3 hold - Small Range Straight Leg Raise  - 2 x daily - 7 x weekly - 1 sets - 10 reps - Seated Flexion Stretch with Swiss Ball  - 1 x daily - 7 x weekly - 2-3 reps - 30 sec hold   ASSESSMENT:  CLINICAL IMPRESSION: Patient is a 84 y.o. female who was seen today for physical therapy evaluation and treatment for LBP and L LE pain/weakness. Pt has difficulty balancing on bil Les L > R, exhibits functional weakness on L LE, excessive tenderness to palpation of sacral borders and glutes. Pt prefers activities in neutral spine position and needs to have a wedge under her back when supine but is unable to tolerate prone positioning. Pt would benefit from PT for sacral/glute manual therapy to decrease muscle tightness when tolerated,  core stability therex, bil LE strengthening L > R, postural education and strengthening, and balance therex. Radicular sxs were inconsistently reproduced during evaluation in LLE.    OBJECTIVE IMPAIRMENTS: Abnormal gait, decreased balance, decreased mobility, difficulty walking, decreased strength, impaired flexibility, postural dysfunction, and pain.   ACTIVITY LIMITATIONS: carrying, bending, sitting, standing, squatting, sleeping, stairs, transfers, and locomotion  level  PARTICIPATION LIMITATIONS: meal prep, cleaning, shopping, and community activity  PERSONAL FACTORS: Behavior pattern are also affecting patient's functional outcome.   REHAB POTENTIAL: Good  CLINICAL DECISION MAKING: Evolving/moderate complexity  EVALUATION COMPLEXITY: Low   GOALS: Goals reviewed with patient? Yes  SHORT TERM GOALS: Target date: 12/15/24  Pt will be independent with initial HEP for pain management Baseline: issued at eval Goal status: INITIAL  2.  Pt will report 25% improvement in LLE radicular pain occurrence with functional activities Baseline: unmet Goal status: INITIAL  3.  Pt will report 25% improvement in lumbar/sacral pain when sitting for work Baseline: unmet Goal status: INITIAL    LONG TERM GOALS: Target date: 01/12/25  Pt will be able to lay flat in the bed with </=3/10 sacral pain Baseline: up to 10/10 Goal status: INITIAL  2.  Pt will be able to stand to brush her teeth with </=3/10 sacral/L LE pain Baseline: 7/10 Goal status: INITIAL  3.  Pt will be able to walk x 20 minutes without L LE pain Baseline: 9 minutes Goal status: INITIAL  4.  Pt will be able to transfer bil Les in and out of the car without difficulty Baseline: unable Goal status: INITIAL  5.  Pt will be able to perform all mobility without feeling L LE to be weak or buckle Baseline: unmet Goal status: INITIAL    PLAN:  PT FREQUENCY: 1-2x/week  PT DURATION: 8 weeks  PLANNED INTERVENTIONS: [id number]- PT Re-evaluation, 97110-Therapeutic exercises, 97530- Therapeutic activity, [id number]- Neuromuscular re-education, 97535- Self Care, [id number]- Manual therapy, [phone]- Gait training, and Patient/Family education.  PLAN FOR NEXT SESSION: do LEFS, review HEP, painfree L LE strengthening and balance activities   Alger Ada, PT 11/17/2024, 11:22 AM  "

## 2024-11-19 NOTE — Progress Notes (Signed)
 Lumbar spine MRI does show a bulging disc pressing on nerves that could explain your symptoms.  Plan for epidural steroid injection.  You should hear soon about scheduling.

## 2024-11-27 ENCOUNTER — Encounter: Payer: Self-pay | Admitting: Cardiology

## 2024-11-27 ENCOUNTER — Inpatient Hospital Stay: Attending: Hematology and Oncology

## 2024-11-27 DIAGNOSIS — D509 Iron deficiency anemia, unspecified: Secondary | ICD-10-CM

## 2024-11-27 LAB — CBC WITH DIFFERENTIAL (CANCER CENTER ONLY)
Abs Immature Granulocytes: 0.02 10*3/uL (ref 0.00–0.07)
Basophils Absolute: 0 10*3/uL (ref 0.0–0.1)
Basophils Relative: 0 %
Eosinophils Absolute: 0.1 10*3/uL (ref 0.0–0.5)
Eosinophils Relative: 1 %
HCT: 31.9 % — ABNORMAL LOW (ref 36.0–46.0)
Hemoglobin: 10.3 g/dL — ABNORMAL LOW (ref 12.0–15.0)
Immature Granulocytes: 0 %
Lymphocytes Relative: 32 %
Lymphs Abs: 2.2 10*3/uL (ref 0.7–4.0)
MCH: 26.7 pg (ref 26.0–34.0)
MCHC: 32.3 g/dL (ref 30.0–36.0)
MCV: 82.6 fL (ref 80.0–100.0)
Monocytes Absolute: 0.9 10*3/uL (ref 0.1–1.0)
Monocytes Relative: 13 %
Neutro Abs: 3.8 10*3/uL (ref 1.7–7.7)
Neutrophils Relative %: 54 %
Platelet Count: 172 10*3/uL (ref 150–400)
RBC: 3.86 MIL/uL — ABNORMAL LOW (ref 3.87–5.11)
RDW: 14.4 % (ref 11.5–15.5)
WBC Count: 7 10*3/uL (ref 4.0–10.5)
nRBC: 0 % (ref 0.0–0.2)

## 2024-11-27 LAB — IRON AND IRON BINDING CAPACITY (CC-WL,HP ONLY)
Iron: 20 ug/dL — ABNORMAL LOW (ref 28–170)
Saturation Ratios: 5 % — ABNORMAL LOW (ref 10.4–31.8)
TIBC: 409 ug/dL (ref 250–450)
UIBC: 389 ug/dL

## 2024-11-27 LAB — FERRITIN: Ferritin: 15 ng/mL (ref 11–307)

## 2024-11-28 ENCOUNTER — Encounter (HOSPITAL_BASED_OUTPATIENT_CLINIC_OR_DEPARTMENT_OTHER): Payer: Self-pay

## 2024-11-28 ENCOUNTER — Ambulatory Visit (HOSPITAL_BASED_OUTPATIENT_CLINIC_OR_DEPARTMENT_OTHER): Payer: Self-pay

## 2024-11-28 ENCOUNTER — Telehealth (HOSPITAL_BASED_OUTPATIENT_CLINIC_OR_DEPARTMENT_OTHER): Payer: Self-pay

## 2024-11-28 DIAGNOSIS — M79605 Pain in left leg: Secondary | ICD-10-CM

## 2024-11-28 DIAGNOSIS — M5442 Lumbago with sciatica, left side: Secondary | ICD-10-CM | POA: Diagnosis not present

## 2024-11-28 DIAGNOSIS — M6281 Muscle weakness (generalized): Secondary | ICD-10-CM

## 2024-11-28 DIAGNOSIS — M5459 Other low back pain: Secondary | ICD-10-CM

## 2024-11-28 NOTE — Telephone Encounter (Signed)
"  ° °  Pre-operative Risk Assessment    Patient Name: [name]  DOB: May 20, 1941 MRN: [id number]   Date of last office visit: 10/09/2024 with Dr. Cherrie Date of next office visit: 02/25/2025 with Dr. Ladona  Request for Surgical Clearance    Procedure:  Lumbar Epidural  Date of Surgery:  Clearance TBD   URGENT     Surgeon:  -does not specify Surgeon's Group or Practice Name:  [facility] Phone number:  [phone] Fax number:  [phone]   Type of Clearance Requested:   - Medical  - Pharmacy:  Hold Apixaban  (Eliquis ) 6 doses prior to procedure   Type of Anesthesia:  Not Indicated   Additional requests/questions:  URGENT  SignedPatrcia Iverson CROME   11/28/2024, 11:45 AM   "

## 2024-11-28 NOTE — Therapy (Signed)
 " OUTPATIENT PHYSICAL THERAPY TREATMENT   Patient Name: [name] MRN: [id number] DOB:Sep 24, 1941, 84 y.o., female Today's Date: 11/28/2024  END OF SESSION:  PT End of Session - 11/28/24 1249     Visit Number 2    Number of Visits 16    Date for Recertification  01/12/25    Authorization Type [employer] MCR    Progress Note Due on Visit 10    PT Start Time 1103    PT Stop Time 1145    PT Time Calculation (min) 42 min    Activity Tolerance Patient tolerated treatment well    Behavior During Therapy WFL for tasks assessed/performed           Past Medical History:  Diagnosis Date   Acid reflux    Anemia    Arthritis    Atrial fibrillation (HCC)    Cataract    Diverticulosis of colon    Endometrial cancer (HCC)    1A grade endometrial ca   Essential hypertension 01/24/2009   Qualifier: Diagnosis of  By: Nelson-Smith CMA (AAMA), Dottie     Hx of radiation therapy 04/24/08-05/29/08& 8/12/,8/19,&06/27/08   external beam  through 7/09 ,then intracavity in 06/2008   Hypertension    Mitral valve prolapse    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   Tricuspid regurgitation    Past Surgical History:  Procedure Laterality Date   APPENDECTOMY     1967   arthroscopic knee right surgery     CLIPPING OF ATRIAL APPENDAGE N/A 02/08/2024   Procedure: CLIPPING, LEFT ATRIAL APPENDAGE USING A MEDTRONIC CLIP;  Surgeon: Maryjane Mt, MD;  Location: [facility] OR;  Service: Open Heart Surgery;  Laterality: N/A;   ECTOPIC PREGNANCY SURGERY  1967   IR THORACENTESIS RIGHT ASP PLEURAL SPACE W/IMG GUIDE  02/27/2024   iron  infusion  12/2022   LAPAROSCOPIC HYSTERECTOMY     total   MAZE N/A 02/08/2024   Procedure: MAZE PROCEDURE;  Surgeon: Maryjane Mt, MD;  Location: [facility];  Service: Open Heart Surgery;  Laterality: N/A;   MITRAL VALVE REPAIR N/A 02/08/2024   Procedure: MITRAL VALVE REPAIR USING A SIMULUS SEMI-RIGID ANNULOPLASTY BAND;  Surgeon: Maryjane Mt, MD;  Location: [facility] OR;  Service:  Open Heart Surgery;  Laterality: N/A;  possible replacement   ORTHOPEDIC SURGERY     Orthoscopic Knee Surgery   RIGHT/LEFT HEART CATH AND CORONARY ANGIOGRAPHY N/A 12/30/2023   Procedure: RIGHT/LEFT HEART CATH AND CORONARY ANGIOGRAPHY;  Surgeon: Ladona Heinz, MD;  Location: [facility] INVASIVE CV LAB;  Service: Cardiovascular;  Laterality: N/A;   TEAR DUCT PROBING     TEE WITHOUT CARDIOVERSION N/A 02/08/2024   Procedure: ECHOCARDIOGRAM, TRANSESOPHAGEAL;  Surgeon: Maryjane Mt, MD;  Location: [facility] OR;  Service: Open Heart Surgery;  Laterality: N/A;   TOOTH EXTRACTION     bone graft   TRANSESOPHAGEAL ECHOCARDIOGRAM (CATH LAB) N/A 12/30/2023   Procedure: TRANSESOPHAGEAL ECHOCARDIOGRAM;  Surgeon: Loni Soyla DELENA, MD;  Location: [facility] INVASIVE CV LAB;  Service: Cardiovascular;  Laterality: N/A;   Patient Active Problem List   Diagnosis Date Noted   MV repair (30 mm simulus StJude ring), maze and left atrial appendage clipping (PENDITURE [id number]) on 02/08/2024 03/09/2024   Status post circumferential ablation of pulmonary vein 03/09/2024   Status post ligation of left atrial appendage 03/09/2024   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   Severe mitral regurgitation 02/08/2024   Mitral regurgitation 12/31/2023   Nonrheumatic mitral valve regurgitation 12/30/2023   Atrial fibrillation with RVR (HCC) 12/30/2023  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   Anemia 02/01/2019   Iron  deficiency anemia 11/08/2018   Cancer (HCC)    Enteritis 09/17/2011   CANCER, ENDOMETRIUM 01/24/2009   Essential hypertension 01/24/2009   DIVERTICULOSIS OF COLON 01/24/2009   RECTAL BLEEDING 01/24/2009   Diarrhea 01/24/2009   HEMORRHOIDS, HX OF 01/24/2009      REFERRING PROVIDER:   Joane Artist RAMAN, MD    REFERRING DIAG:  [phone] (ICD-10-CM) - Chronic bilateral low back pain with left-sided sciatica  R29.898 (ICD-10-CM) - Left leg weakness    Rationale for Evaluation and Treatment: Rehabilitation  THERAPY DIAG:   Other low back pain  Muscle weakness (generalized)  Pain in left leg  ONSET DATE: 06/2024   SUBJECTIVE:                                                                                                                                                                                           SUBJECTIVE STATEMENT:  Pt reports her L knee buckles 1-2x/day. Hasn't happened yet today. Glute pain is worse by the end of the day. Has been putting heat on, which she thinks is helping.   Eval: Had open heart surgery 02/08/24 and then [facility]. Aug 2025 it started hurting mid L buttock and going down to the foot. Was a sharp pain. Laying flat aggravates and good posture improves.   PERTINENT HISTORY:    low back and L leg pain x 1-2 month. Pt suffered a fall when 1st returning home after her heart surgery in April. Pt locates pain to L-side of her back is the worse, but bilat, w/ radiating pain along the lateral aspect her whole L leg. Pain seems to be worse at night, waking her up when trying to roll over.   PAIN:  Are you having pain? Yes: NPRS scale: 6/10 Pain location: sacrum Pain description: achey Aggravating factors: laying down Relieving factors: good posture  PRECAUTIONS:  Endometrial Cancer, Mitral valve repair  RED FLAGS: None   WEIGHT BEARING RESTRICTIONS:  No  FALLS:  Has patient fallen in last 6 months? No  LIVING ENVIRONMENT: Lives with: lives with their family Lives in: House/apartment Stairs: yes Has following equipment at home: Single point cane and Environmental Consultant - 2 wheeled  OCCUPATION:  Works for a careers adviser company  PLOF:  Independent  PATIENT GOALS:  To move without pain   OBJECTIVE:  Note: Objective measures were completed at Evaluation unless otherwise noted.    Extreme difficulty/unable (0), Quite a bit of difficulty (1), Moderate difficulty (2), Little difficulty (3), No difficulty (4) Survey date:  11/28/24  Any of your usual  work, housework or school activities 4  2. Usual hobbies, recreational or sporting activities 3  3. Getting into/out of the bath 4  4. Walking between rooms 4  5. Putting on socks/shoes 2  6. Squatting  2  7. Lifting an object, like a bag of groceries from the floor 2  8. Performing light activities around your home 4  9. Performing heavy activities around your home 0  10. Getting into/out of a car 3  11. Walking 2 blocks 3  12. Walking 1 mile 0  13. Going up/down 10 stairs (1 flight) 3  14. Standing for 1 hour 0  15.  sitting for 1 hour 4  16. Running on even ground 0  17. Running on uneven ground 0  18. Making sharp turns while running fast 0  19. Hopping  0  20. Rolling over in bed 3  Score total:  41/80      DIAGNOSTIC FINDINGS:  10/17/24: Xray- 1. Moderate degenerative changes of the sacroiliac joints with possible chronic sacral insufficiency fracture. 2. Moderate facet arthropathy of the lower lumbar spine with associated grade 1 to 2 anterolisthesis of L4 on L5 and disc space narrowing at L4-5, slightly progressed. 3. Severe disc space narrowing at L5-S1 with additional mild spondylosis of the lumbar spine and visualized lower thoracic spine.  11/09/24 MRI exam but not notes  PATIENT SURVEYS:  To be completed at first tx visit  COGNITIVE STATUS: Within functional limits for tasks assessed   SENSATION: WFL  EDEMA:  Yes: bil lower legs; wearing compression stockings  POSTURE:  rounded shoulders and increased thoracic kyphosis    GAIT: Distance walked: in gym Assistive device utilized: None Level of assistance: Modified independence Comments: stands with left knee slightly bent   Body Part #1 Lumbar, Hip, and Knee  PALPATION: [place] symmetrical, popliteal fossa =; L gluteal fold significantly higher and glute tighter with tenderness to light tough palpation.Pt unable to tolerate true PROM bil Les due to reported glute pain and proximal hamstring  discomfort.  LUMBAR ROM:   Active  A/PROM  eval  Flexion WNL  Extension WNL  Right lateral flexion WNL  Left lateral flexion WNL  Right rotation Limited 80%  Left rotation Limited 80%   (Blank rows = not tested) L lateral lean incrased L radicular sxs    LOWER EXTREMITY ROM:     Passive  Right eval Left eval  Hip flexion [facility]  Hip extension    Hip abduction    Hip adduction    Hip internal rotation [facility] WFL  Hip external rotation [facility]  Knee flexion WNL WNL  Knee extension WNL WNL  Ankle dorsiflexion    Ankle plantarflexion    Ankle inversion    Ankle eversion     (Blank rows = not tested)   LOWER EXTREMITY MMT:    MMT Right eval Left eval  Hip flexion 23 23.8  Hip extension    Hip abduction    Hip adduction    Hip internal rotation    Hip external rotation    Knee flexion 23.2 17.8  Knee extension 15.4 16.4   Ankle dorsiflexion 18.5 19.4  Ankle plantarflexion    Ankle inversion    Ankle eversion     (Blank rows = not tested)    SPECIAL TESTS:  Lumbar Straight leg raise test: Negative, Slump test: Negative, and Single leg stance test: Positive. SLR + for Hamstring tightness   TREATMENT DATE:   11/28/24:  STM to L gluteal mm, QL in sidelying Hooklying adductor squeeze with TrA 5 x85min Supine SLR 2x5 ea Hooklying pirifomris stretch x2 ea 30sec LTR 5sec x10ea Seated QL stretching Standing hip abduction x10ea   11/17/24: Evaluation completed. Reviewed with pt she should consider using her walker or cane esp on unbalanced days. Reviewed PT findings. Issued HEP.      PATIENT EDUCATION:  Education details: HEP Person educated: Patient Education method: Programmer, Multimedia, Facilities Manager, and Handouts Education comprehension: verbalized understanding and returned demonstration  HOME EXERCISE PROGRAM: Access Code: [email or web address] URL: [email or web address] Date: 11/17/2024 Prepared by: Alger Ada  Exercises - Supine Quad Set  - 2 x  daily - 7 x weekly - 1 sets - 10-20 reps - 2-3 hold - Small Range Straight Leg Raise  - 2 x daily - 7 x weekly - 1 sets - 10 reps - Seated Flexion Stretch with Swiss Ball  - 1 x daily - 7 x weekly - 2-3 reps - 30 sec hold   ASSESSMENT:  CLINICAL IMPRESSION:  Captured LEFS score today: 41/80. Spent time on medical intervention to decrease soft tissue restrictions surrounding left gluteal area as well as QL and lower lumbar paraspinals.  Patient significantly tender here with palpable restrictions.  Educated patient on DOMS and management.  Patient significantly weak with supine straight leg raise bilaterally.  Only performed limited reps with both sides to not cause hip flexor activation.  Patient reported some soreness following MT but no significant discomfort.  Instructed patient to utilize heating pad for symptom management over the next few days.  With standing hip abduction patient required verbal cueing to correct hip external rotation.  She did report some left hip discomfort when weightbearing through that side.  Will assess response to today's interventions next session.  Will progress as tolerated.   Eval: Patient is a 84 y.o. female who was seen today for physical therapy evaluation and treatment for LBP and L LE pain/weakness. Pt has difficulty balancing on bil Les L > R, exhibits functional weakness on L LE, excessive tenderness to palpation of sacral borders and glutes. Pt prefers activities in neutral spine position and needs to have a wedge under her back when supine but is unable to tolerate prone positioning. Pt would benefit from PT for sacral/glute manual therapy to decrease muscle tightness when tolerated, core stability therex, bil LE strengthening L > R, postural education and strengthening, and balance therex. Radicular sxs were inconsistently reproduced during evaluation in LLE.    OBJECTIVE IMPAIRMENTS: Abnormal gait, decreased balance, decreased mobility, difficulty  walking, decreased strength, impaired flexibility, postural dysfunction, and pain.   ACTIVITY LIMITATIONS: carrying, bending, sitting, standing, squatting, sleeping, stairs, transfers, and locomotion level  PARTICIPATION LIMITATIONS: meal prep, cleaning, shopping, and community activity  PERSONAL FACTORS: Behavior pattern are also affecting patient's functional outcome.   REHAB POTENTIAL: Good  CLINICAL DECISION MAKING: Evolving/moderate complexity  EVALUATION COMPLEXITY: Low   GOALS: Goals reviewed with patient? Yes  SHORT TERM GOALS: Target date: 12/15/24  Pt will be independent with initial HEP for pain management Baseline: issued at eval Goal status: INITIAL  2.  Pt will report 25% improvement in LLE radicular pain occurrence with functional activities Baseline: unmet Goal status: INITIAL  3.  Pt will report 25% improvement in lumbar/sacral pain when sitting for work Baseline: unmet Goal status: INITIAL    LONG TERM GOALS: Target date: 01/12/25  Pt will be able to lay flat in the bed with </=3/10 sacral pain Baseline: up to  10/10 Goal status: INITIAL  2.  Pt will be able to stand to brush her teeth with </=3/10 sacral/L LE pain Baseline: 7/10 Goal status: INITIAL  3.  Pt will be able to walk x 20 minutes without L LE pain Baseline: 9 minutes Goal status: INITIAL  4.  Pt will be able to transfer bil Les in and out of the car without difficulty Baseline: unable Goal status: INITIAL  5.  Pt will be able to perform all mobility without feeling L LE to be weak or buckle Baseline: unmet Goal status: INITIAL    PLAN:  PT FREQUENCY: 1-2x/week  PT DURATION: 8 weeks  PLANNED INTERVENTIONS: 97164- PT Re-evaluation, 97110-Therapeutic exercises, 97530- Therapeutic activity, [id number]- Neuromuscular re-education, 97535- Self Care, [id number]- Manual therapy, [phone]- Gait training, and Patient/Family education.  PLAN FOR NEXT SESSION:  review HEP, painfree L LE  strengthening and balance activities   Asberry FORBES Rodes, PTA 11/28/2024, 12:51 PM  "

## 2024-11-28 NOTE — Telephone Encounter (Signed)
 Pharmacy, can you please provide recommendations for holding Eliquis  for upcoming spinal injection?  Thank you!

## 2024-11-29 ENCOUNTER — Inpatient Hospital Stay: Admitting: Hematology and Oncology

## 2024-11-29 DIAGNOSIS — D509 Iron deficiency anemia, unspecified: Secondary | ICD-10-CM

## 2024-11-29 NOTE — Progress Notes (Signed)
 HEMATOLOGY-ONCOLOGY TELEPHONE VISIT PROGRESS NOTE  I connected with our patient on 11/29/24 at 11:15 AM EST by telephone and verified that I am speaking with the correct person using two identifiers.  I discussed the limitations, risks, security and privacy concerns of performing an evaluation and management service by telephone and the availability of in person appointments.  I also discussed with the patient that there may be a patient responsible charge related to this service. The patient expressed understanding and agreed to proceed.   History of Present Illness: Complains of fatigue and ice chip cravings  History of Present Illness [name] is an 84 year old female with recurrent iron  deficiency anemia who presents for hematology follow-up due to persistent anemia and symptomatic iron  deficiency.  Recent labs show hemoglobin 10.3 g/dL and iron  saturation 5%, improved from a nadir of 8.3 g/dL in July but still subnormal.  She has recurrent pica with craving and chewing ice, which she associates with worsening anemia. She denies chest pain, dyspnea, and palpitations.  She previously tolerated intravenous Venofer  infusions. She currently takes famotidine 15 mg.  REVIEW OF SYSTEMS:   Constitutional: Denies fevers, chills or abnormal weight loss All other systems were reviewed with the patient and are negative. Observations/Objective:     Assessment Plan:  Iron  deficiency anemia Diagnosed in 2018 and underwent upper endoscopy and colonoscopy by Dr. 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   02/08/24: Valve surgery   Lab review 05/14/2019: Ferritin 13, iron  saturation 19%, hemoglobin 12.3 11/16/2019: Ferritin 27, iron  saturation 16%, hemoglobin 12.6 11/06/2021: Hemoglobin 10.5, MCV 77.6,  TIBC 442, iron  saturation 4% 03/25/2022: Hemoglobin 12.8, MCV 90, TIBC 466, iron  saturation 20%, ferritin 16 11/12/2022: Hemoglobin 10.7, MCV 79.1, platelets 180, ferritin 9.9, creatinine 1.01 03/29/2023: Hemoglobin 11.8, MCV 89.1, TIBC 490, iron  saturation 11%, ferritin 8 05/31/2023: Hemoglobin 10.4, MCV 83.9, iron  saturation 5%, TIBC 511, ferritin 7 12/05/2023: Hemoglobin 11.1, MCV 86, iron  saturation 7%, TIBC 487, ferritin 7 05/29/2024: Hemoglobin 8.3, MCV 81.7, iron  saturation 5%, ferritin 36 11/27/2024: Hemoglobin 10.3, MCV 82.6, iron  saturation 5%, ferritin 15   Recommendation: IV iron  therapy Follow-up in 6 months with labs     I discussed the assessment and treatment plan with the patient. The patient was provided an opportunity to ask questions and all were answered. The patient agreed with the plan and demonstrated an understanding of the instructions. The patient was advised to call back or seek an in-person evaluation if the symptoms worsen or if the condition fails to improve as anticipated.   I provided 20 minutes of non-face-to-face time during this encounter.  This includes time for charting and coordination of care   Naomi MARLA Chad, MD

## 2024-11-29 NOTE — Assessment & Plan Note (Signed)
 Diagnosed in 2018 and underwent upper endoscopy and colonoscopy by Dr. 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   02/08/24: Valve surgery   Lab review 05/14/2019: Ferritin 13, iron  saturation 19%, hemoglobin 12.3 11/16/2019: Ferritin 27, iron  saturation 16%, hemoglobin 12.6 11/06/2021: Hemoglobin 10.5, MCV 77.6, TIBC 442, iron  saturation 4% 03/25/2022: Hemoglobin 12.8, MCV 90, TIBC 466, iron  saturation 20%, ferritin 16 11/12/2022: Hemoglobin 10.7, MCV 79.1, platelets 180, ferritin 9.9, creatinine 1.01 03/29/2023: Hemoglobin 11.8, MCV 89.1, TIBC 490, iron  saturation 11%, ferritin 8 05/31/2023: Hemoglobin 10.4, MCV 83.9, iron  saturation 5%, TIBC 511, ferritin 7 12/05/2023: Hemoglobin 11.1, MCV 86, iron  saturation 7%, TIBC 487, ferritin 7 05/29/2024: Hemoglobin 8.3, MCV 81.7, iron  saturation 5%, ferritin 36 11/27/2024: Hemoglobin 10.3, MCV 82.6, iron  saturation 5%, ferritin 15   Recommendation: IV iron  therapy Follow-up in 6 months with labs

## 2024-12-03 ENCOUNTER — Telehealth (HOSPITAL_BASED_OUTPATIENT_CLINIC_OR_DEPARTMENT_OTHER): Payer: Self-pay | Admitting: *Deleted

## 2024-12-03 ENCOUNTER — Ambulatory Visit (HOSPITAL_BASED_OUTPATIENT_CLINIC_OR_DEPARTMENT_OTHER): Payer: Self-pay | Admitting: Physical Therapy

## 2024-12-03 ENCOUNTER — Encounter (HOSPITAL_BASED_OUTPATIENT_CLINIC_OR_DEPARTMENT_OTHER): Payer: Self-pay

## 2024-12-03 NOTE — Telephone Encounter (Signed)
 SABRA

## 2024-12-03 NOTE — Telephone Encounter (Signed)
 Left message to call back to schedule tele pre op appt.

## 2024-12-03 NOTE — Telephone Encounter (Signed)
" ° °  Name: [name]  DOB: Jul 16, 1941  MRN: [id number]  Primary Cardiologist: Gordy Bergamo, MD   Preoperative team, please contact this patient and set up a phone call appointment for further preoperative risk assessment. Please obtain consent and complete medication review. Thank you for your help.  I confirm that guidance regarding antiplatelet and oral anticoagulation therapy has been completed and, if necessary, noted below.  Per Pharm D, patient has not had an Afib/aflutter ablation within the last 3 months, DCCV within the last 4 weeks, or Watchman in the last 45 days. Patient may hold Eliquis  for 3.5 days prior to procedure.    I also confirmed the patient resides in the state of [place] . As per [facility] Medical Board telemedicine laws, the patient must reside in the state in which the provider is licensed.     Barnie Hila, NP 12/03/2024, 9:09 AM [facility]     "

## 2024-12-03 NOTE — Telephone Encounter (Signed)
 Pt returning call to a nurse

## 2024-12-03 NOTE — Telephone Encounter (Signed)
 Pt has been scheduled tele preop appt 12/04/24. Med rec and consent are done.

## 2024-12-04 ENCOUNTER — Other Ambulatory Visit (HOSPITAL_COMMUNITY): Payer: Self-pay | Admitting: Internal Medicine

## 2024-12-04 ENCOUNTER — Ambulatory Visit

## 2024-12-04 DIAGNOSIS — Z01810 Encounter for preprocedural cardiovascular examination: Secondary | ICD-10-CM

## 2024-12-04 DIAGNOSIS — Z01818 Encounter for other preprocedural examination: Secondary | ICD-10-CM

## 2024-12-04 DIAGNOSIS — I5032 Chronic diastolic (congestive) heart failure: Secondary | ICD-10-CM

## 2024-12-04 NOTE — Progress Notes (Signed)
 "   Virtual Visit via Telephone Note   Because of TAMIKO LEOPARD co-morbid illnesses, she is at least at moderate risk for complications without adequate follow up.  This format is felt to be most appropriate for this patient at this time.  Due to technical limitations with video connection (technology), today's appointment will be conducted as an audio only telehealth visit, and MARIANGEL RINGLEY verbally agreed to proceed in this manner.   All issues noted in this document were discussed and addressed.  No physical exam could be performed with this format.  Evaluation Performed:  Preoperative cardiovascular risk assessment _____________   Date:  12/04/2024   Patient ID:  Ashni, Lonzo May 28, 1941, MRN [id number] Patient Location:  Home Provider location:   Office  Primary Care Provider:  Clarice Nottingham, MD Primary Cardiologist:  Gordy Bergamo, MD  Chief Complaint / Patient Profile   84 y.o. y/o female with a h/o hypertension, PAF, severe MR s/p MVR, venous insufficiency, and HF with improved EF who is pending lumbar epidural and presents today for telephonic preoperative cardiovascular risk assessment.  History of Present Illness    REMI RESTER is a 84 y.o. female who presents via audio/video conferencing for a telehealth visit today.  Pt was last seen in cardiology clinic on 10/09/2024 by Dr. Cherrie. At that time MAEBY VANKLEECK was doing well.  The patient is now pending procedure as outlined above. Since her last visit, she has joined [employer] twice weekly and PT for her leg pain. She is doing weight exercises and walking. She does have swelling in her left leg that is due to her lower back pain.   She denies chest pain, shortness of breath, lower extremity edema, fatigue, palpitations, melena, hematuria, hemoptysis, diaphoresis, weakness, presyncope, syncope, orthopnea, and PND.  Past Medical History    Past Medical History:  Diagnosis Date   Acid reflux    Anemia    Arthritis     Atrial fibrillation (HCC)    Cataract    Diverticulosis of colon    Endometrial cancer (HCC)    1A grade endometrial ca   Essential hypertension 01/24/2009   Qualifier: Diagnosis of  By: Nelson-Smith CMA (AAMA), Dottie     Hx of radiation therapy 04/24/08-05/29/08& 8/12/,8/19,&06/27/08   external beam  through 7/09 ,then intracavity in 06/2008   Hypertension    Mitral valve prolapse    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   Tricuspid regurgitation    Past Surgical History:  Procedure Laterality Date   APPENDECTOMY     1967   arthroscopic knee right surgery     CLIPPING OF ATRIAL APPENDAGE N/A 02/08/2024   Procedure: CLIPPING, LEFT ATRIAL APPENDAGE USING A MEDTRONIC CLIP;  Surgeon: Maryjane Mt, MD;  Location: [facility] OR;  Service: Open Heart Surgery;  Laterality: N/A;   ECTOPIC PREGNANCY SURGERY  1967   IR THORACENTESIS RIGHT ASP PLEURAL SPACE W/IMG GUIDE  02/27/2024   iron  infusion  12/2022   LAPAROSCOPIC HYSTERECTOMY     total   MAZE N/A 02/08/2024   Procedure: MAZE PROCEDURE;  Surgeon: Maryjane Mt, MD;  Location: [facility] OR;  Service: Open Heart Surgery;  Laterality: N/A;   MITRAL VALVE REPAIR N/A 02/08/2024   Procedure: MITRAL VALVE REPAIR USING A SIMULUS SEMI-RIGID ANNULOPLASTY BAND;  Surgeon: Maryjane Mt, MD;  Location: [facility] OR;  Service: Open Heart Surgery;  Laterality: N/A;  possible replacement   ORTHOPEDIC SURGERY     Orthoscopic Knee Surgery  RIGHT/LEFT HEART CATH AND CORONARY ANGIOGRAPHY N/A 12/30/2023   Procedure: RIGHT/LEFT HEART CATH AND CORONARY ANGIOGRAPHY;  Surgeon: Ladona Heinz, MD;  Location: [facility] INVASIVE CV LAB;  Service: Cardiovascular;  Laterality: N/A;   TEAR DUCT PROBING     TEE WITHOUT CARDIOVERSION N/A 02/08/2024   Procedure: ECHOCARDIOGRAM, TRANSESOPHAGEAL;  Surgeon: Maryjane Mt, MD;  Location: [facility] OR;  Service: Open Heart Surgery;  Laterality: N/A;   TOOTH EXTRACTION     bone graft   TRANSESOPHAGEAL ECHOCARDIOGRAM (CATH LAB) N/A 12/30/2023    Procedure: TRANSESOPHAGEAL ECHOCARDIOGRAM;  Surgeon: Loni Soyla LABOR, MD;  Location: [facility] INVASIVE CV LAB;  Service: Cardiovascular;  Laterality: N/A;    Allergies  Allergies[1]  Home Medications    Prior to Admission medications  Medication Sig Start Date End Date Taking? Authorizing Provider  acetaminophen  (TYLENOL ) 500 MG tablet Take 1-2 tablets (500-1,000 mg total) by mouth every 6 (six) hours as needed for mild pain (pain score 1-3) or fever. 03/09/24   Barrett, Erin R, PA-C  amiodarone  (PACERONE ) 200 MG tablet Take 0.5 tablets (100 mg total) by mouth daily. 08/20/24   Ladona Heinz, MD  apixaban  (ELIQUIS ) 2.5 MG TABS tablet Take 1 tablet (2.5 mg total) by mouth 2 (two) times daily. 04/13/24   Ladona Heinz, MD  atorvastatin  (LIPITOR) 10 MG tablet Take 1 tablet (10 mg total) by mouth daily. 12/20/23 04/05/25  Ladona Heinz, MD  b complex vitamins tablet Take 1 tablet by mouth daily.      [provider]  CALCIUM  PO Take 650 mg by mouth daily.    [provider]  Cholecalciferol (VITAMIN D3) 125 MCG (5000 UT) CAPS Take 5,000 Units by mouth daily.    [provider]  denosumab  (PROLIA ) 60 MG/ML SOSY injection Inject 60 mg into the skin every 6 (six) months.    [provider]  gabapentin (NEURONTIN) 100 MG capsule Take 100 mg by mouth 2 (two) times daily. 05/16/24   [provider]  magnesium  gluconate (MAGONATE) 500 (27 Mg) MG TABS tablet Take 1 tablet (500 mg total) by mouth daily. 03/10/24   Barrett, Rocky SAUNDERS, PA-C  Misc Natural Products (GLUCOSAMINE CHOND MSM FORMULA) TABS Take 1 tablet by mouth as needed.    [provider]  Multiple Vitamins-Minerals (MULTIVITAMINS THER. W/MINERALS) TABS Take 1 tablet by mouth daily.      [provider]  pantoprazole  (PROTONIX ) 40 MG tablet Take 40 mg by mouth daily.    [provider]  potassium chloride  SA (KLOR-CON  M) 20 MEQ tablet Take 20 mEq by mouth daily.    [provider]   torsemide  (DEMADEX ) 20 MG tablet Take 1 tablet (20 mg total) by mouth as directed. 1 tab daily in the morning. Can take 2 extra tab daily as needed for leg swelling 08/20/24   Ladona Heinz, MD  Zinc 50 MG TABS Take 50 mg by mouth daily.    [provider]    Physical Exam    Vital Signs:  ALAYZHA AN does not have vital signs available for review today.  Given telephonic nature of communication, physical exam is limited. AAOx3. NAD. Normal affect.  Speech and respirations are unlabored.  Accessory Clinical Findings    None  Assessment & Plan    1.  Preoperative Cardiovascular Risk Assessment: According to the Revised Cardiac Risk Index (RCRI), her Perioperative Risk of Major Cardiac Event is (%): 6.6 Her   according to the Duke Activity Status Index (DASI). Per AHA/ACC guidelines,  she is deemed acceptable risk for the planned procedure without additional cardiovascular testing. Will route to surgical team so they are aware.   The patient was advised that if she develops new symptoms prior to surgery to contact our office to arrange for a follow-up visit, and she verbalized understanding.  Per Pharm.D.- Per office protocol, patient can hold Eliquis  for 3.5 days prior to procedure.   A copy of this note will be routed to requesting surgeon.  DRI [phone]   Time:   Today, I have spent 10 minutes with the patient with telehealth technology discussing medical history, symptoms, and management plan.     Mardy KATHEE Pizza, FNP  12/04/2024, 7:55 AM     [1]  Allergies Allergen Reactions   Crestor [Rosuvastatin] Other (See Comments)    Leg cramps   Glycopyrrolate      Other reaction(s): extreme dry mouth   Codeine Other (See Comments)    Hyper   "

## 2024-12-05 ENCOUNTER — Ambulatory Visit (HOSPITAL_BASED_OUTPATIENT_CLINIC_OR_DEPARTMENT_OTHER)

## 2024-12-05 ENCOUNTER — Encounter (HOSPITAL_BASED_OUTPATIENT_CLINIC_OR_DEPARTMENT_OTHER): Payer: Self-pay

## 2024-12-05 DIAGNOSIS — M6281 Muscle weakness (generalized): Secondary | ICD-10-CM

## 2024-12-05 DIAGNOSIS — M79605 Pain in left leg: Secondary | ICD-10-CM

## 2024-12-05 DIAGNOSIS — M5459 Other low back pain: Secondary | ICD-10-CM

## 2024-12-05 NOTE — Therapy (Signed)
 " OUTPATIENT PHYSICAL THERAPY TREATMENT   Patient Name: [name] MRN: [id number] DOB:03-02-1941, 84 y.o., female Today's Date: 12/05/2024  END OF SESSION:  PT End of Session - 12/05/24 0936     Visit Number 3    Number of Visits 16    Date for Recertification  01/12/25    Authorization Type [employer] MCR    Progress Note Due on Visit 10    PT Start Time 0934    PT Stop Time 1015    PT Time Calculation (min) 41 min    Activity Tolerance Patient tolerated treatment well    Behavior During Therapy [facility] for tasks assessed/performed            Past Medical History:  Diagnosis Date   Acid reflux    Anemia    Arthritis    Atrial fibrillation (HCC)    Cataract    Diverticulosis of colon    Endometrial cancer (HCC)    1A grade endometrial ca   Essential hypertension 01/24/2009   Qualifier: Diagnosis of  By: Nelson-Smith CMA (AAMA), Dottie     Hx of radiation therapy 04/24/08-05/29/08& 8/12/,8/19,&06/27/08   external beam  through 7/09 ,then intracavity in 06/2008   Hypertension    Mitral valve prolapse    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   Tricuspid regurgitation    Past Surgical History:  Procedure Laterality Date   APPENDECTOMY     1967   arthroscopic knee right surgery     CLIPPING OF ATRIAL APPENDAGE N/A 02/08/2024   Procedure: CLIPPING, LEFT ATRIAL APPENDAGE USING A MEDTRONIC CLIP;  Surgeon: Maryjane Mt, MD;  Location: [facility] OR;  Service: Open Heart Surgery;  Laterality: N/A;   ECTOPIC PREGNANCY SURGERY  1967   IR THORACENTESIS RIGHT ASP PLEURAL SPACE W/IMG GUIDE  02/27/2024   iron  infusion  12/2022   LAPAROSCOPIC HYSTERECTOMY     total   MAZE N/A 02/08/2024   Procedure: MAZE PROCEDURE;  Surgeon: Maryjane Mt, MD;  Location: [facility] OR;  Service: Open Heart Surgery;  Laterality: N/A;   MITRAL VALVE REPAIR N/A 02/08/2024   Procedure: MITRAL VALVE REPAIR USING A SIMULUS SEMI-RIGID ANNULOPLASTY BAND;  Surgeon: Maryjane Mt, MD;  Location: [facility] OR;  Service:  Open Heart Surgery;  Laterality: N/A;  possible replacement   ORTHOPEDIC SURGERY     Orthoscopic Knee Surgery   RIGHT/LEFT HEART CATH AND CORONARY ANGIOGRAPHY N/A 12/30/2023   Procedure: RIGHT/LEFT HEART CATH AND CORONARY ANGIOGRAPHY;  Surgeon: Ladona Heinz, MD;  Location: [facility] INVASIVE CV LAB;  Service: Cardiovascular;  Laterality: N/A;   TEAR DUCT PROBING     TEE WITHOUT CARDIOVERSION N/A 02/08/2024   Procedure: ECHOCARDIOGRAM, TRANSESOPHAGEAL;  Surgeon: Maryjane Mt, MD;  Location: [facility] OR;  Service: Open Heart Surgery;  Laterality: N/A;   TOOTH EXTRACTION     bone graft   TRANSESOPHAGEAL ECHOCARDIOGRAM (CATH LAB) N/A 12/30/2023   Procedure: TRANSESOPHAGEAL ECHOCARDIOGRAM;  Surgeon: Loni Soyla DELENA, MD;  Location: [facility] INVASIVE CV LAB;  Service: Cardiovascular;  Laterality: N/A;   Patient Active Problem List   Diagnosis Date Noted   MV repair (30 mm simulus StJude ring), maze and left atrial appendage clipping (PENDITURE [id number]) on 02/08/2024 03/09/2024   Status post circumferential ablation of pulmonary vein 03/09/2024   Status post ligation of left atrial appendage 03/09/2024   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   Severe mitral regurgitation 02/08/2024   Mitral regurgitation 12/31/2023   Nonrheumatic mitral valve regurgitation 12/30/2023   Atrial fibrillation with RVR (HCC) 12/30/2023  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   Anemia 02/01/2019   Iron  deficiency anemia 11/08/2018   Cancer (HCC)    Enteritis 09/17/2011   CANCER, ENDOMETRIUM 01/24/2009   Essential hypertension 01/24/2009   DIVERTICULOSIS OF COLON 01/24/2009   RECTAL BLEEDING 01/24/2009   Diarrhea 01/24/2009   HEMORRHOIDS, HX OF 01/24/2009      REFERRING PROVIDER:   Joane Artist RAMAN, MD    REFERRING DIAG:  [phone] (ICD-10-CM) - Chronic bilateral low back pain with left-sided sciatica  R29.898 (ICD-10-CM) - Left leg weakness    Rationale for Evaluation and Treatment: Rehabilitation  THERAPY DIAG:   Muscle weakness (generalized)  Pain in left leg  Other low back pain  ONSET DATE: 06/2024   SUBJECTIVE:                                                                                                                                                                                           SUBJECTIVE STATEMENT:  Pt reports soreness in L hip today. I worked out at [facility] last night. Pt reports her pain improved after PT last session up until last night.  Eval: Had open heart surgery 02/08/24 and then [facility]. Aug 2025 it started hurting mid L buttock and going down to the foot. Was a sharp pain. Laying flat aggravates and good posture improves.   PERTINENT HISTORY:    low back and L leg pain x 1-2 month. Pt suffered a fall when 1st returning home after her heart surgery in April. Pt locates pain to L-side of her back is the worse, but bilat, w/ radiating pain along the lateral aspect her whole L leg. Pain seems to be worse at night, waking her up when trying to roll over.   PAIN:  Are you having pain? Yes: NPRS scale: 6/10 Pain location: sacrum Pain description: achey Aggravating factors: laying down Relieving factors: good posture  PRECAUTIONS:  Endometrial Cancer, Mitral valve repair  RED FLAGS: None   WEIGHT BEARING RESTRICTIONS:  No  FALLS:  Has patient fallen in last 6 months? No  LIVING ENVIRONMENT: Lives with: lives with their family Lives in: House/apartment Stairs: yes Has following equipment at home: Single point cane and Environmental Consultant - 2 wheeled  OCCUPATION:  Works for a careers adviser company  PLOF:  Independent  PATIENT GOALS:  To move without pain   OBJECTIVE:  Note: Objective measures were completed at Evaluation unless otherwise noted.    Extreme difficulty/unable (0), Quite a bit of difficulty (1), Moderate difficulty (2), Little difficulty (3), No difficulty (4) Survey date:  11/28/24  Any of your usual work, housework or  school  activities 4  2. Usual hobbies, recreational or sporting activities 3  3. Getting into/out of the bath 4  4. Walking between rooms 4  5. Putting on socks/shoes 2  6. Squatting  2  7. Lifting an object, like a bag of groceries from the floor 2  8. Performing light activities around your home 4  9. Performing heavy activities around your home 0  10. Getting into/out of a car 3  11. Walking 2 blocks 3  12. Walking 1 mile 0  13. Going up/down 10 stairs (1 flight) 3  14. Standing for 1 hour 0  15.  sitting for 1 hour 4  16. Running on even ground 0  17. Running on uneven ground 0  18. Making sharp turns while running fast 0  19. Hopping  0  20. Rolling over in bed 3  Score total:  41/80      DIAGNOSTIC FINDINGS:  10/17/24: Xray- 1. Moderate degenerative changes of the sacroiliac joints with possible chronic sacral insufficiency fracture. 2. Moderate facet arthropathy of the lower lumbar spine with associated grade 1 to 2 anterolisthesis of L4 on L5 and disc space narrowing at L4-5, slightly progressed. 3. Severe disc space narrowing at L5-S1 with additional mild spondylosis of the lumbar spine and visualized lower thoracic spine.  11/09/24 MRI exam but not notes  PATIENT SURVEYS:  To be completed at first tx visit  COGNITIVE STATUS: Within functional limits for tasks assessed   SENSATION: WFL  EDEMA:  Yes: bil lower legs; wearing compression stockings  POSTURE:  rounded shoulders and increased thoracic kyphosis    GAIT: Distance walked: in gym Assistive device utilized: None Level of assistance: Modified independence Comments: stands with left knee slightly bent   Body Part #1 Lumbar, Hip, and Knee  PALPATION: [place] symmetrical, popliteal fossa =; L gluteal fold significantly higher and glute tighter with tenderness to light tough palpation.Pt unable to tolerate true PROM bil Les due to reported glute pain and proximal hamstring discomfort.  LUMBAR  ROM:   Active  A/PROM  eval  Flexion WNL  Extension WNL  Right lateral flexion WNL  Left lateral flexion WNL  Right rotation Limited 80%  Left rotation Limited 80%   (Blank rows = not tested) L lateral lean incrased L radicular sxs    LOWER EXTREMITY ROM:     Passive  Right eval Left eval  Hip flexion [facility]  Hip extension    Hip abduction    Hip adduction    Hip internal rotation [facility] WFL  Hip external rotation [facility]  Knee flexion WNL WNL  Knee extension WNL WNL  Ankle dorsiflexion    Ankle plantarflexion    Ankle inversion    Ankle eversion     (Blank rows = not tested)   LOWER EXTREMITY MMT:    MMT Right eval Left eval  Hip flexion 23 23.8  Hip extension    Hip abduction    Hip adduction    Hip internal rotation    Hip external rotation    Knee flexion 23.2 17.8  Knee extension 15.4 16.4   Ankle dorsiflexion 18.5 19.4  Ankle plantarflexion    Ankle inversion    Ankle eversion     (Blank rows = not tested)    SPECIAL TESTS:  Lumbar Straight leg raise test: Negative, Slump test: Negative, and Single leg stance test: Positive. SLR + for Hamstring tightness   TREATMENT DATE:   12/05/24: STM to L gluteal  mm, QL in sidelying Piriformis stretch 30sec x3 ea bil Hooklying adductor squeeze with TrA 5 x30min Supine SLR 3x5 ea Hooklying pirifomris stretch x3 ea 30sec LTR 5sec x10ea S/l hip abduction 2x10bil Seated QL stretching bil STS 2x10  11/28/24: STM to L gluteal mm, QL in sidelying Hooklying adductor squeeze with TrA 5 x72min Supine SLR 2x5 ea Hooklying pirifomris stretch x2 ea 30sec LTR 5sec x10ea Seated QL stretching Standing hip abduction x10ea   11/17/24: Evaluation completed. Reviewed with pt she should consider using her walker or cane esp on unbalanced days. Reviewed PT findings. Issued HEP.      PATIENT EDUCATION:  Education details: HEP Person educated: Patient Education method: Programmer, Multimedia, Facilities Manager, and  Handouts Education comprehension: verbalized understanding and returned demonstration  HOME EXERCISE PROGRAM: Access Code: [email or web address] URL: [email or web address] Date: 11/17/2024 Prepared by: Alger Ada  Exercises - Supine Quad Set  - 2 x daily - 7 x weekly - 1 sets - 10-20 reps - 2-3 hold - Small Range Straight Leg Raise  - 2 x daily - 7 x weekly - 1 sets - 10 reps - Seated Flexion Stretch with Swiss Ball  - 1 x daily - 7 x weekly - 2-3 reps - 30 sec hold   ASSESSMENT:  CLINICAL IMPRESSION:  Performed STM to bil glutes today with pt demonstrating greater tightness in  R hip today. Pt tolerated s/l hip abduction well. She denied pain with this. Able to increase reps with supine SLR, though she did report mild fatigue. Worked on STS technique to improve balance and hip hinge with this. She will benefit from continued PT to continue working on strength, [facility], and mobility.    Eval: Patient is a 84 y.o. female who was seen today for physical therapy evaluation and treatment for LBP and L LE pain/weakness. Pt has difficulty balancing on bil Les L > R, exhibits functional weakness on L LE, excessive tenderness to palpation of sacral borders and glutes. Pt prefers activities in neutral spine position and needs to have a wedge under her back when supine but is unable to tolerate prone positioning. Pt would benefit from PT for sacral/glute manual therapy to decrease muscle tightness when tolerated, core stability therex, bil LE strengthening L > R, postural education and strengthening, and balance therex. Radicular sxs were inconsistently reproduced during evaluation in LLE.    OBJECTIVE IMPAIRMENTS: Abnormal gait, decreased balance, decreased mobility, difficulty walking, decreased strength, impaired flexibility, postural dysfunction, and pain.   ACTIVITY LIMITATIONS: carrying, bending, sitting, standing, squatting, sleeping, stairs, transfers, and locomotion level  PARTICIPATION  LIMITATIONS: meal prep, cleaning, shopping, and community activity  PERSONAL FACTORS: Behavior pattern are also affecting patient's functional outcome.   REHAB POTENTIAL: Good  CLINICAL DECISION MAKING: Evolving/moderate complexity  EVALUATION COMPLEXITY: Low   GOALS: Goals reviewed with patient? Yes  SHORT TERM GOALS: Target date: 12/15/24  Pt will be independent with initial HEP for pain management Baseline: issued at eval Goal status: INITIAL  2.  Pt will report 25% improvement in LLE radicular pain occurrence with functional activities Baseline: unmet Goal status: INITIAL  3.  Pt will report 25% improvement in lumbar/sacral pain when sitting for work Baseline: unmet Goal status: INITIAL    LONG TERM GOALS: Target date: 01/12/25  Pt will be able to lay flat in the bed with </=3/10 sacral pain Baseline: up to 10/10 Goal status: INITIAL  2.  Pt will be able to stand to brush her teeth with </=3/10 sacral/L LE pain Baseline: 7/10  Goal status: INITIAL  3.  Pt will be able to walk x 20 minutes without L LE pain Baseline: 9 minutes Goal status: INITIAL  4.  Pt will be able to transfer bil Les in and out of the car without difficulty Baseline: unable Goal status: INITIAL  5.  Pt will be able to perform all mobility without feeling L LE to be weak or buckle Baseline: unmet Goal status: INITIAL    PLAN:  PT FREQUENCY: 1-2x/week  PT DURATION: 8 weeks  PLANNED INTERVENTIONS: [id number]- PT Re-evaluation, 97110-Therapeutic exercises, 97530- Therapeutic activity, [id number]- Neuromuscular re-education, 97535- Self Care, [id number]- Manual therapy, [phone]- Gait training, and Patient/Family education.  PLAN FOR NEXT SESSION:  review HEP, painfree L LE strengthening and balance activities   Asberry FORBES Rodes, PTA 12/05/2024, 10:44 AM  "

## 2024-12-06 NOTE — Telephone Encounter (Signed)
 Refill sent

## 2024-12-07 ENCOUNTER — Encounter: Payer: Self-pay | Admitting: Family Medicine

## 2024-12-11 ENCOUNTER — Ambulatory Visit

## 2024-12-12 ENCOUNTER — Encounter (HOSPITAL_BASED_OUTPATIENT_CLINIC_OR_DEPARTMENT_OTHER): Payer: Self-pay

## 2024-12-12 ENCOUNTER — Other Ambulatory Visit

## 2024-12-19 ENCOUNTER — Encounter (HOSPITAL_BASED_OUTPATIENT_CLINIC_OR_DEPARTMENT_OTHER): Payer: Self-pay | Admitting: Physical Therapy

## 2024-12-25 ENCOUNTER — Encounter (HOSPITAL_BASED_OUTPATIENT_CLINIC_OR_DEPARTMENT_OTHER): Payer: Self-pay | Admitting: Physical Therapy

## 2025-02-25 ENCOUNTER — Ambulatory Visit: Admitting: Cardiology

## 2025-05-29 ENCOUNTER — Inpatient Hospital Stay

## 2025-06-03 ENCOUNTER — Inpatient Hospital Stay: Admitting: Hematology and Oncology
